# Patient Record
Sex: Male | Born: 1955 | Race: White | Hispanic: No | Marital: Single | Smoking: Former smoker
Health system: Southern US, Community
[De-identification: ages and names within clinical notes are randomized; demographics above are authoritative.]

## PROBLEM LIST (undated history)

## (undated) DIAGNOSIS — E119 Type 2 diabetes mellitus without complications: Secondary | ICD-10-CM

## (undated) DIAGNOSIS — G629 Polyneuropathy, unspecified: Secondary | ICD-10-CM

## (undated) DIAGNOSIS — I10 Essential (primary) hypertension: Secondary | ICD-10-CM

## (undated) DIAGNOSIS — J449 Chronic obstructive pulmonary disease, unspecified: Secondary | ICD-10-CM

## (undated) DIAGNOSIS — I509 Heart failure, unspecified: Secondary | ICD-10-CM

---

## 2013-08-11 ENCOUNTER — Inpatient Hospital Stay: Payer: Self-pay | Admitting: Internal Medicine

## 2013-08-11 LAB — COMPREHENSIVE METABOLIC PANEL
Albumin: 3.8 g/dL (ref 3.4–5.0)
Alkaline Phosphatase: 66 U/L
Anion Gap: 9 (ref 7–16)
BUN: 13 mg/dL (ref 7–18)
Bilirubin,Total: 0.7 mg/dL (ref 0.2–1.0)
Calcium, Total: 9.1 mg/dL (ref 8.5–10.1)
Chloride: 100 mmol/L (ref 98–107)
Glucose: 368 mg/dL — ABNORMAL HIGH (ref 65–99)
Osmolality: 287 (ref 275–301)

## 2013-08-11 LAB — URINALYSIS, COMPLETE
Bilirubin,UR: NEGATIVE
Glucose,UR: >=500 mg/dL (ref 0–75)
Hyaline Cast: 1
Ketone: NEGATIVE
Leukocyte Esterase: NEGATIVE
Nitrite: NEGATIVE
Protein: 30
Specific Gravity: 1.009 (ref 1.003–1.030)
WBC UR: NONE SEEN /HPF (ref 0–5)

## 2013-08-11 LAB — DRUG SCREEN, URINE
Amphetamines, Ur Screen: NEGATIVE (ref ?–1000)
MDMA (Ecstasy)Ur Screen: NEGATIVE (ref ?–500)
Phencyclidine (PCP) Ur S: NEGATIVE (ref ?–25)

## 2013-08-11 LAB — CBC
HCT: 45.9 % (ref 40.0–52.0)
HGB: 14.9 g/dL (ref 13.0–18.0)
MCH: 30.7 pg (ref 26.0–34.0)
MCHC: 32.6 g/dL (ref 32.0–36.0)
MCV: 94 fL (ref 80–100)
RDW: 13.4 % (ref 11.5–14.5)

## 2013-08-11 LAB — ETHANOL: Ethanol: <3 mg/dL

## 2013-08-12 LAB — CBC WITH DIFFERENTIAL/PLATELET
Basophil #: 0 10*3/uL (ref 0.0–0.1)
Basophil %: 0.4 %
Eosinophil #: 0.1 10*3/uL (ref 0.0–0.7)
HCT: 39.7 % — ABNORMAL LOW (ref 40.0–52.0)
Lymphocyte %: 19.5 %
MCHC: 33.7 g/dL (ref 32.0–36.0)
Monocyte #: 1 x10 3/mm (ref 0.2–1.0)
Neutrophil #: 7.6 10*3/uL — ABNORMAL HIGH (ref 1.4–6.5)
Platelet: 199 10*3/uL (ref 150–440)
WBC: 10.9 10*3/uL — ABNORMAL HIGH (ref 3.8–10.6)

## 2013-08-12 LAB — COMPREHENSIVE METABOLIC PANEL
Albumin: 2.8 g/dL — ABNORMAL LOW (ref 3.4–5.0)
Alkaline Phosphatase: 49 U/L
Anion Gap: 1 — ABNORMAL LOW (ref 7–16)
Bilirubin,Total: 0.5 mg/dL (ref 0.2–1.0)
Calcium, Total: 8.3 mg/dL — ABNORMAL LOW (ref 8.5–10.1)
Co2: 32 mmol/L (ref 21–32)
Creatinine: 1.09 mg/dL (ref 0.60–1.30)
EGFR (African American): >60
EGFR (Non-African Amer.): >60
Glucose: 160 mg/dL — ABNORMAL HIGH (ref 65–99)
Osmolality: 274 (ref 275–301)
Potassium: 3.7 mmol/L (ref 3.5–5.1)
SGOT(AST): 20 U/L (ref 15–37)
Total Protein: 5.8 g/dL — ABNORMAL LOW (ref 6.4–8.2)

## 2013-08-12 LAB — HEMOGLOBIN A1C: Hemoglobin A1C: 6.9 % — ABNORMAL HIGH (ref 4.2–6.3)

## 2013-08-12 LAB — LIPID PANEL: HDL Cholesterol: 35 mg/dL — ABNORMAL LOW (ref 40–60)

## 2013-08-20 ENCOUNTER — Observation Stay: Payer: Self-pay | Admitting: Internal Medicine

## 2013-08-20 LAB — COMPREHENSIVE METABOLIC PANEL
Albumin: 3.7 g/dL (ref 3.4–5.0)
Anion Gap: 1 — ABNORMAL LOW (ref 7–16)
Bilirubin,Total: 0.8 mg/dL (ref 0.2–1.0)
Calcium, Total: 9.1 mg/dL (ref 8.5–10.1)
Co2: 34 mmol/L — ABNORMAL HIGH (ref 21–32)
Creatinine: 1.22 mg/dL (ref 0.60–1.30)
EGFR (African American): >60
EGFR (Non-African Amer.): >60
Glucose: 115 mg/dL — ABNORMAL HIGH (ref 65–99)
SGOT(AST): 29 U/L (ref 15–37)

## 2013-08-20 LAB — CBC WITH DIFFERENTIAL/PLATELET
Basophil #: 0.1 10*3/uL (ref 0.0–0.1)
Basophil %: 0.7 %
Eosinophil #: 0 10*3/uL (ref 0.0–0.7)
HGB: 15.5 g/dL (ref 13.0–18.0)
MCH: 30.8 pg (ref 26.0–34.0)
MCHC: 34 g/dL (ref 32.0–36.0)
MCV: 91 fL (ref 80–100)
Monocyte #: 1.2 x10 3/mm — ABNORMAL HIGH (ref 0.2–1.0)
Monocyte %: 16.3 %
Neutrophil %: 64.6 %
Platelet: 277 10*3/uL (ref 150–440)
RBC: 5.04 10*6/uL (ref 4.40–5.90)
RDW: 13 % (ref 11.5–14.5)
WBC: 7.5 10*3/uL (ref 3.8–10.6)

## 2013-08-20 LAB — URINALYSIS, COMPLETE
Bacteria: NONE SEEN
Bilirubin,UR: NEGATIVE
Blood: NEGATIVE
Glucose,UR: NEGATIVE mg/dL (ref 0–75)
Hyaline Cast: 1
Ketone: NEGATIVE
Protein: NEGATIVE
RBC,UR: 1 /HPF (ref 0–5)
Specific Gravity: 1.014 (ref 1.003–1.030)
Squamous Epithelial: NONE SEEN
WBC UR: 1 /HPF (ref 0–5)

## 2013-08-20 LAB — RAPID INFLUENZA A&B ANTIGENS

## 2013-08-20 LAB — TROPONIN I
Troponin-I: 0.22 ng/mL — ABNORMAL HIGH
Troponin-I: 0.22 ng/mL — ABNORMAL HIGH
Troponin-I: 0.22 ng/mL — ABNORMAL HIGH

## 2013-08-20 LAB — PROTIME-INR
INR: 1
Prothrombin Time: 13 secs (ref 11.5–14.7)

## 2013-08-20 LAB — CK TOTAL AND CKMB (NOT AT ARMC)
CK, Total: 39 U/L (ref 35–232)
CK-MB: 1.3 ng/mL (ref 0.5–3.6)

## 2013-08-21 DIAGNOSIS — R079 Chest pain, unspecified: Secondary | ICD-10-CM

## 2013-08-22 LAB — BETA STREP CULTURE(ARMC)

## 2014-05-13 DIAGNOSIS — J441 Chronic obstructive pulmonary disease with (acute) exacerbation: Secondary | ICD-10-CM | POA: Diagnosis present

## 2014-05-13 DIAGNOSIS — E1142 Type 2 diabetes mellitus with diabetic polyneuropathy: Secondary | ICD-10-CM | POA: Diagnosis present

## 2014-05-13 DIAGNOSIS — I10 Essential (primary) hypertension: Secondary | ICD-10-CM | POA: Diagnosis present

## 2014-05-13 DIAGNOSIS — Z794 Long term (current) use of insulin: Secondary | ICD-10-CM | POA: Diagnosis present

## 2014-05-13 DIAGNOSIS — E1165 Type 2 diabetes mellitus with hyperglycemia: Secondary | ICD-10-CM | POA: Diagnosis present

## 2014-05-13 DIAGNOSIS — J449 Chronic obstructive pulmonary disease, unspecified: Secondary | ICD-10-CM | POA: Diagnosis present

## 2014-12-16 NOTE — H&P (Signed)
PATIENT NAME:  Cody Alexander, Cody Alexander MR#:  [id number] DATE OF BIRTH:  1956/08/25  DATE OF ADMISSION:  08/11/2013  PRIMARY CARE PHYSICIAN: None.   REFERRING EMERGENCY ROOM PHYSICIAN:  Dr. Lucrezia Europe.   CHIEF COMPLAINT: Drug overdose.   HISTORY OF PRESENT ILLNESS: This is a 59 year old male with past medical history of blood pressure diagnosed a few years ago and was prescribed medication, but is not taking anything because of insurance issues and does not have money to pay for it.  He has not been following with any doctor for the last few years. Went to a party last night and he drank alcohol there.  He injected some cocaine and he sniffed some heroin.  He does not remember the amount, but he said that he took a lot and returned home around 3:30 in the morning. His girlfriend found him very drowsy and not very well oriented, not waking up.  After a few minutes, he stopped breathing and started turning purple so the girlfriend called EMS and they gave her instruction on the phone to start mouth-to-mouth breathing. She said she also did some CPR on her own but she does not know about his pulses. EMS arrived, gave him Narcan and brought him to the Emergency Room. He still continued remaining hypoxic and breathing at the rate of 8 to 10, so the ER physician started him on a Narcan IV drip. After giving injection and now on drip, he is more arousable and breathing on his own. He denies any suicidal attempt. He says that he was stupid to do this must drugs at the party. He is not a drug seeker and does not do any drugs on a regular basis. That is what he told me.  Currently feeling fine, somewhat thirsty and nauseated.   REVIEW OF SYSTEMS:  CONSTITUTIONAL: Negative for fever, fatigue, weakness, pain or weight loss.  EYES: No blurring, double vision, discharge, or redness.  EARS, NOSE, THROAT: No tinnitus, ear pain or hearing loss.  RESPIRATORY: No cough, wheezing, hemoptysis, or shortness of breath at this  time.  CARDIOVASCULAR: No chest pain, orthopnea, edema or palpitations.  GASTROINTESTINAL: No nausea, vomiting, diarrhea, abdominal pain.  GENITOURINARY: No dysuria, hematuria, or increased frequency of urination.  ENDOCRINE: No increased sweating. No heat or cold intolerance.  SKIN: No acne, rashes, or lesions on the skin.  MUSCULOSKELETAL: No pain or swelling in the joints.  NEUROLOGICAL: No numbness, weakness, tremors or vertigo.  PSYCHIATRIC: Appears a little anxious, but denies any baseline problems like bipolar or schizophrenia.   PAST MEDICAL HISTORY:  He was diagnosed with hypertension many years ago and he was prescribed some medicine but does not take any medicine and does not go to any doctor.   PAST SURGICAL HISTORY: None.   SOCIAL HISTORY: He lives with girlfriend, smokes 2 packs of cigarettes every day. Denies using alcohol on a regular basis, but sometimes drinks beer at parties and denies doing any drugs on a regular basis but at parties he does it.   FAMILY HISTORY: Unknown as he says that "I don't have any family members. I don't know anybody."  HOME MEDICATIONS: None.   PHYSICAL EXAMINATION: VITAL SIGNS: In the ER on arrival, respiration rate was 10 and oxygen saturation was 88% to 85%. Blood pressure is stable 125/87 and heart rate 95 and after starting on Narcan currently respiratory rate 16 to 18 and oxygen saturation is 95% on room air.  GENERAL: The patient is alert, appears slightly anxious at  this time and oriented very well to time, place, and person now. Has some confusion about exact events, what happened, but admits taking all these drugs at the party.  HEENT: Head and neck atraumatic. Conjunctiva pink. Oral mucosa slightly dry.  NECK: Supple. No JVD.  RESPIRATORY: Bilateral clear and equal air entry.  CARDIOVASCULAR: S1, S2 present, regular. No murmur.  ABDOMEN: Soft, nontender. Bowel sounds present. No organomegaly.  SKIN: There is some bruising on left  shin of tibia present, but otherwise no rashes.  LEGS: No edema.  NEUROLOGICAL: Power 5/5. Moves all 4 limbs. No gross abnormality.  PSYCHIATRIC: As mentioned above, slightly anxious.   IMPORTANT LABORATORY RESULTS:  Glucose 368, BUN 13, creatinine 2, sodium 136, potassium 4.3, chloride 100 and CO2 27, calcium 9.1,. Urine for toxicology is positive for cocaine, cannabinoids and opiate. WBC 11,000, hemoglobin 14.9, platelet count 243. Urinalysis is grossly negative. Chest x-ray is no evidence of acute cardiopulmonary disease.   ASSESSMENT AND PLAN: A 59 year old male with past medical history of hypertension, but not following very well with any doctor. Did heroine and cocaine, overdosed at party yesterday and had acute respiratory failure this morning.  Recovered on Narcan. Currently on Narcan drip.   1.  Acute respiratory failure due to drug overdose. We will continue Narcan drip and monitor him in Critical Care Unit. Currently he is breathing on his own and oxygen saturation is satisfactory. We will taper the Narcan drip, as allowed in Critical Care Unit setting and then observe him for continuity of his stability.   2.  Drug overdose. He took ecstasy, heroin and cocaine at a party yesterday. Admits that it was not suicidal and there is no suicidal ideation. It was a drug overdose at a party. He does not take medication or these drugs on a regular basis. I explained to him about the dangers of this type of episode and he understands and said that he was stupid to take this much drugs. He does not do it on routine basis.  3.  History of hypertension currently blood pressure is stable. We will monitor his blood pressure and see later on if need to start medicine or not.  4.  Hyperglycemia. We will check his finger stick without insulin coverage every 6 hours and will check HbA1c to check for long-term control  5.  Smoking tobacco abuse. Smoking cessation counseling is done for 4 minutes. Offered him to  have help like nicotine patch or gum but he said he would be fine and does not want to have any support while he is in the hospital.   CONDITION: Critical due to acute respiratory failure and high risk for having it again. We will monitor in Critical Care Unit. Plan explained to the patient and his girlfriend in the room. He is FULL CODE.   TOTAL TIME SPENT CRITICAL CARE:  50 minutes in this admission.    ____________________________ Hope PigeonVaibhavkumar G. Elisabeth PigeonVachhani, MD vgv:dp D: 08/11/2013 09:08:35 ET T: 08/11/2013 09:29:30 ET JOB#: [id number]  cc: Hope PigeonVaibhavkumar G. Elisabeth PigeonVachhani, MD, <Dictator> Altamese DillingVAIBHAVKUMAR Dally Oshel MD ELECTRONICALLY SIGNED 08/16/2013 14:38

## 2014-12-16 NOTE — Discharge Summary (Signed)
PATIENT NAME:  Cody Alexander, Cody Alexander MR#:  [id number] DATE OF BIRTH:  1955-12-09  DATE OF ADMISSION:  08/11/2013 DATE OF DISCHARGE:  08/12/2013  DISCHARGE DIAGNOSES:  1. Acute respiratory failure due to drug overdose, heroin and cocaine.  2. Acute renal failure.  3. Elevated blood sugar, needs diet control.   CONDITION ON DISCHARGE: Stable.   CODE STATUS: Full code.   DIET ON DISCHARGE: Carbohydrate-controlled ADA diet. Regular consistency.   FOLLOWUP: Advised to follow within 4 to 6 weeks, routine followup with primary care physician.   HISTORY OF PRESENTING ILLNESS: A 59 year old male, with past medical history of blood pressure diagnosed a few years ago and prescribed some medication, but was not taking anything because of insurance issue, did not have money to pay for the medication. He went to a party the previous night and drank some alcohol and injected some cocaine and sniffed some heroin. After that, he does not remember how much was the amount and what happened. He returned home around 3:30 in the morning. Girlfriend found him very drowsy, not very well oriented and not waking up. After a few minutes he stopped breathing and started turning purple, so girlfriend called EMS, and they gave instruction on the phone to start mouth-to-mouth breathing and do some CPR. She did CPR and did some mouth-to-mouth breathing also. EMS arrived and gave him some Narcan and brought him to the Emergency Room. He had some response initially, but then was still remaining drowsy, and his breathing rate was ranging from 8 to 10, and he was going hypoxic, so ER physician started him on Narcan IV drip and gave him as admission to hospitalist team.   HOSPITAL COURSE:  1. On further evaluation, he was found to be needing Narcan drip for a longer time, so admitted to CCU for further management. He remained in CCU on Narcan drip for a few hours and slowly tapering down the Narcan drip. By evening, he was able to come  off Narcan drip and had a sound sleep. He remained totally fine. Heart rate, respiratory rate and oxygenation remained stable after he was out of the effect of medication, and so next day, we decided to discharge him after counseling of not to do drugs. Other medical issues in the hospital:  2. History of hypertension. As he was telling, he had hypertension in the past, but was not taking the medication. We monitored in the hospital, and blood pressure was under control without any medication, so we did not give any medicines on discharge.  3. Hyperglycemia. Blood sugar was in the range of 140 to 180, and his HbA1c level was 6.9, so we advised him on diet control for a few months and get it checked again with his primary care physician.  4. Tobacco smoking. Advised to have smoking cessation. He agreed, and he said he will try after the discharge.  5. Acute renal failure. This was present on admission, but improved with IV fluids and was normal on discharge.   IMPORTANT LABORATORY RESULTS IN THE HOSPITAL: Ethanol level less than 3. WBC count was 11,000, hemoglobin was 14.9, platelet was 243. Creatinine was 2.0, sodium 136 and potassium 4.3 on admission. Urinalysis was grossly negative. Urine for toxicology: Cocaine was positive, and opiate was positive, cannabinoid was also positive in urine. Chest x-ray, portable, single view: No evidence of acute cardiopulmonary disease. Next, on followup creatinine, it came to 1.09. Hemoglobin A1c was 6.9.   TOTAL TIME SPENT ON THIS DISCHARGE: 45 minutes.  ____________________________ Hope PigeonVaibhavkumar G. Elisabeth PigeonVachhani, MD vgv:lb D: 08/13/2013 23:34:51 ET T: 08/14/2013 07:12:46 ET JOB#: [id number]  cc: Hope PigeonVaibhavkumar G. Elisabeth PigeonVachhani, MD, <Dictator> Altamese DillingVAIBHAVKUMAR Israa Caban MD ELECTRONICALLY SIGNED 08/16/2013 14:39

## 2014-12-17 NOTE — H&P (Signed)
PATIENT NAME:  Cody Alexander, Cody Alexander DATE OF BIRTH:  August 09, 1956  DATE OF ADMISSION:  08/20/2013  PRIMARY CARE PHYSICIAN: None.   REFERRING ER PHYSICIAN: Dr. Manson PasseyBrown.  CHIEF COMPLAINT: Chest pain.   HISTORY OF PRESENT ILLNESS: This is a 59 year old male with past medical history of blood pressure and who is a smoker, was a drug abuser but says that since last 1 week when he was admitted with drug overdose and possible cardiac arrest secondary to that, since then he is not using any drugs. As per him, since last week when he went home he started having complaint of runny nose, some congestion in the sinuses, headache, and had a fever also 1 or 2 times in the last 1 week. Last evening when he was in the bathroom he almost passed out for almost a minute, but denies any palpitation or injuries during that episode and felt some chest pain at that time but no palpitation. On further questioning, he says that he always had chest pain coming on and off, not associated with any activity, and getting resolved by itself. Today morning because of his excessive headaches and fever and runny nose he decided to come to the Emergency Room. When he was in the waiting room, in ER, he had severe chest pain which was crushing type, central, 8 out of 10. He did not speak to anybody about the pain, but he just relaxed and took some deep breaths and tried to calm him down. After 5 to 10 minutes the pain went away. He did not speak to any nurse in waiting room about this thing. When the ER physician came to evaluate him he spoke to him about this thing. EKG was done which showed some T wave inversion and troponin was 0.22, slightly elevated, so he is given for further management of his chest pain issues.   REVIEW OF SYSTEMS: . It was (Dictatio CONSTITUTIONAL: Positive for fever and fatigue, but no weakness, weight loss, or weight gain.  EYES: No blurring or double vision, discharge or redness.  EARS, NOSE, THROAT:  No tinnitus, ear pain, or hearing loss.  RESPIRATORY: No cough or wheezing, but had flulike symptoms since 1 week.  CARDIOVASCULAR: Has some chest pain and had episode of syncope last night. No palpitations or edema on the limbs.  GASTROINTESTINAL: No nausea, vomiting, diarrhea, or abdominal pain.  GENITOURINARY: No dysuria, hematuria, or increased frequency.  ENDOCRINE: No increased sweating. No heat or cold intolerance.  SKIN: No acne, rashes, or lesions on the skin.  MUSCULOSKELETAL: No pain or swelling in the joints.  NEUROLOGICAL: No numbness, weakness, tremors, or vertigo.  PSYCHIATRIC: No anxiety, insomnia, or bipolar disorder.   PAST MEDICAL HISTORY:  Hypertension years ago, but was not taking any medication and last week when he was in the hospital was not found to have hypertension. 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   SOCIAL HISTORY: Lives with girlfriend. Smoked 2 packs of cigarettes every day, for the last 5 days did not smoke anything. Sometimes drinks beer and for drugs also he would do it recreationally occasionally, but after having an episode of severe drug overdose last week he said he is not doing it anymore.  FAMILY HISTORY: Father had some heart issues of some blockages and irregular heartbeats in his 4650s, and he died of a heart attack at age of 58.   HOME MEDICATIONS:  None.  PHYSICAL EXAMINATION:  VITAL SIGNS: In the ER, temperature 98.9, pulse 96, respirations 22, blood pressure 146/104, and pulse ox of 94% on room air.  GENERAL: The patient is fully alert and oriented to time, place, and person. Does not appear in any acute distress.  HEENT: Head and neck atraumatic. Conjunctiva pink. Oral mucosa moist.  NECK: Supple. No JVD.  RESPIRATORY: Bilateral clear and equal air entry.  CARDIOVASCULAR: S1 and S2 present, regular. No murmur.  ABDOMEN: Soft, nontender. Bowel sounds present. Obese. No  organomegaly.  SKIN: No rashes.  LEGS: No edema.  NEUROLOGICAL: Power 5/5. Follows command. Moves all 4 limbs. No gross abnormality.  PSYCHIATRIC: Does not appear in any acute psychiatric illness.  JOINTS: No swelling or tenderness.   IMPORTANT LABORATORY AND DIAGNOSTICS: Glucose 115, BUN 11, creatinine 1.22, sodium 134, potassium 3.7, chloride 99, CO2 34, calcium 9.1. Troponin 0.22. WBC 7.5, hemoglobin 15.5, platelet count 277. Mean corpuscular volume is 91. Influenza A and B are negative.   Chest x-ray and CT of the head without any acute findings. EKG revealed some T wave inversion.   ASSESSMENT AND PLAN: A 59 year old male with no significant past medical history but obese and a smoker having family history of myocardial infarction in father who came with passing out episode last night and had chest pain today in the Emergency Room with inversion of T wave and some elevated troponin.  1.  Chest pain. We will admit him to telemetry floor and follow serial troponins. Will do 1 more troponin within 4 hours and I spoke to cardiologist, Dr. Juliann Pares, and nuclear lab, and they would be able to do the stress test today to rule out ischemic finding. If it is so then he might be able to get discharged in the afternoon, otherwise, we will have to keep him in the hospital for further management. Right now we gave him aspirin. No need for beta blocker as he is going for stress test and blood pressure is stable. His lipid panel was checked last week and was within acceptable range. LDL was 53. Total cholesterol was 101 and triglyceride was 67. 2.  Flu-like symptoms and congestion with headache and fever. Chest x-ray is clear, white cell count is stable, and influenza A and B antigens are negative. Will give him symptomatic treatment with Mucinex. Currently, there is no fever. Otherwise, we will give Tylenol also.   CODE STATUS: FULL.  TOTAL TIME SPENT ON THIS ADMISSION: 50 minutes.   ____________________________ Hope Pigeon Elisabeth Pigeon, MD vgv:sb D: 08/20/2013 09:05:06 ET T: 08/20/2013 09:36:23 ET JOB#: [id number]  cc: Hope Pigeon. Elisabeth Pigeon, MD, <Dictator> Altamese Dilling MD ELECTRONICALLY SIGNED 08/29/2013 21:58

## 2014-12-17 NOTE — Discharge Summary (Signed)
PATIENT NAME:  Cody BernardROBERTSON, Jaishawn MR#:  [id number] DATE OF BIRTH:  Jun 10, 1956  DATE OF ADMISSION:  08/20/2013 DATE OF DISCHARGE:  08/21/2013  DISCHARGE DIAGNOSES:  1.  Chest pain, coronary artery disease ruled out by stress test.  2.  Hypertension.  3.  Obesity.  4.  Hyperglycemia, diet-controlled and lose weight.  5.  Ex-smoker.   CONDITION ON DISCHARGE: Stable.   CODE STATUS: FULL CODE.  DISCHARGE MEDICATIONS: Metoprolol 25 mg take half tablet 2 times a day.   DIET: Advised to low sodium, carbohydrate-controlled ADA diet.   DISCHARGE INSTRUCTIONS: Within 2 to 4 weeks, follow up with PMD and regular checkup of blood pressure and lose weight.  HISTORY OF PRESENT ILLNESS: This is a 59 year old male with past medical history of blood pressure who was a smoker and drug abuser. For the last 1 week, when he was admitted with drug overdose, he did not smoke any. Now started for the last few days runny nose, congestion in the chest and in the sinuses with headache. Fever also 1 to 2 times last week, and so in the previous evening, he was in the bathroom, almost passed out for a minute. Denied any palpitations, injury. During the episode, he felt some chest pain also but no palpitation. He also had some chest pain on and off associated with not any activity, coming at any time in the past.  In the ER, his pain was crushing, 8 out of 10, and he tried to take some deep breaths to calm down, and after 5 to 10 minutes, the pain went away. So he was admitted for further evaluation of his chest pain. Admitted and stress test was done. Troponin remained stable at 0.22. Telemetry remained nonsignificant, and his stress test was reported negative.   Hypertension. He was advised to lose some weight and metoprolol started.   Hypoglycemia. Advised to lose weight and do some low-sugar diet.   No medications were prescribed.   IMPORTANT LABORATORY RESULTS IN THE HOSPITAL: Influenza A and B were negative.  Beta strep culture was negative. White cell count 7.5, hemoglobin 15.5 and platelet count 277. Creatinine 1.22 on admission, sodium 134 and potassium 3.7. Troponin remained stable at 0.22 level.  Stress test was done which showed no significant ischemia, ejection fraction of 44%.   TOTAL TIME SPENT ON THIS DISCHARGE: 40 minutes.   ____________________________ Hope PigeonVaibhavkumar G. Elisabeth PigeonVachhani, MD vgv:np D: 08/25/2013 17:48:00 ET T: 08/25/2013 21:27:28 ET JOB#: [id number]  cc: Hope PigeonVaibhavkumar G. Elisabeth PigeonVachhani, MD, <Dictator> Altamese DillingVAIBHAVKUMAR Blima Jaimes MD ELECTRONICALLY SIGNED 08/29/2013 22:01

## 2016-09-25 ENCOUNTER — Emergency Department
Admission: EM | Admit: 2016-09-25 | Discharge: 2016-09-25 | Disposition: A | Discharge status: Home / Self Care | Payer: Self-pay | Attending: Emergency Medicine | Admitting: Emergency Medicine

## 2016-09-25 ENCOUNTER — Emergency Department: Payer: Self-pay

## 2016-09-25 DIAGNOSIS — R55 Syncope and collapse: Secondary | ICD-10-CM

## 2016-09-25 DIAGNOSIS — J069 Acute upper respiratory infection, unspecified: Secondary | ICD-10-CM | POA: Insufficient documentation

## 2016-09-25 DIAGNOSIS — I10 Essential (primary) hypertension: Secondary | ICD-10-CM | POA: Insufficient documentation

## 2016-09-25 LAB — CBC
HEMATOCRIT: 46.4 % (ref 40.0–52.0)
Hemoglobin: 15.3 g/dL (ref 13.0–18.0)
MCH: 29.9 pg (ref 26.0–34.0)
MCHC: 33 g/dL (ref 32.0–36.0)
MCV: 90.7 fL (ref 80.0–100.0)
Platelets: 256 10*3/uL (ref 150–440)
RBC: 5.12 MIL/uL (ref 4.40–5.90)
RDW: 13.3 % (ref 11.5–14.5)
WBC: 7.7 10*3/uL (ref 3.8–10.6)

## 2016-09-25 LAB — INFLUENZA PANEL BY PCR (TYPE A & B)
INFLBPCR: NEGATIVE
Influenza A By PCR: NEGATIVE

## 2016-09-25 LAB — BASIC METABOLIC PANEL
ANION GAP: 6 (ref 5–15)
BUN: 12 mg/dL (ref 6–20)
CHLORIDE: 101 mmol/L (ref 101–111)
CO2: 30 mmol/L (ref 22–32)
Calcium: 9 mg/dL (ref 8.9–10.3)
Creatinine, Ser: 1.02 mg/dL (ref 0.61–1.24)
Glucose, Bld: 151 mg/dL — ABNORMAL HIGH (ref 65–99)
POTASSIUM: 4.5 mmol/L (ref 3.5–5.1)
SODIUM: 137 mmol/L (ref 135–145)

## 2016-09-25 LAB — TROPONIN I
TROPONIN I: 0.05 ng/mL — AB (ref <0.03)
TROPONIN I: 0.05 ng/mL — AB (ref <0.03)

## 2016-09-25 MED ORDER — HYDROCOD POLST-CPM POLST ER 10-8 MG/5ML PO SUER
5.0000 mL | Freq: Once | ORAL | Status: AC
  Administered 2016-09-25: 5 mL via ORAL
  Filled 2016-09-25: qty 5

## 2016-09-25 MED ORDER — IPRATROPIUM-ALBUTEROL 0.5-2.5 (3) MG/3ML IN SOLN
3.0000 mL | Freq: Once | RESPIRATORY_TRACT | Status: AC
  Administered 2016-09-25: 3 mL via RESPIRATORY_TRACT
  Filled 2016-09-25: qty 3

## 2016-09-25 MED ORDER — ALBUTEROL SULFATE HFA 108 (90 BASE) MCG/ACT IN AERS: 2.0000 | INHALATION_SPRAY | Freq: Four times a day (QID) | RESPIRATORY_TRACT | 2 refills | Status: DC | PRN

## 2016-09-25 MED ORDER — DOXYCYCLINE HYCLATE 100 MG PO CAPS: 100.0000 mg | ORAL_CAPSULE | Freq: Two times a day (BID) | ORAL | 0 refills | Status: AC

## 2016-09-25 MED ORDER — SODIUM CHLORIDE 0.9 % IV BOLUS (SEPSIS)
500.0000 mL | Freq: Once | INTRAVENOUS | Status: AC
  Administered 2016-09-25: 500 mL via INTRAVENOUS

## 2016-09-25 MED ORDER — HYDROCOD POLST-CPM POLST ER 10-8 MG/5ML PO SUER: 5.0000 mL | Freq: Every evening | ORAL | 0 refills | Status: DC | PRN

## 2016-09-25 NOTE — ED Notes (Signed)
Pt reports for the past month he has had a cough that has been non productive, pt reports for the last two weeks he has been coughing so hard that it causes him to get dizzy and "pass out".

## 2016-09-25 NOTE — Discharge Instructions (Signed)
You have been seen in the Emergency Department (ED)  today for cough and congestion.  Your workup is most consistent with a viral illness.  Please drink plenty of fluids to prevent dehydration.  You may use Tylenol or Motrin, as written on the box, as needed for fever or discomfort. ° °Please follow up with your doctor within the next few dasy regarding today?s emergent visit and your current symptoms. ° °Return to the Emergency Department (ED)  if you have worsening cough, fever, ANY trouble breathing, chest pain, or other symptoms that concern you. ° °

## 2016-09-25 NOTE — ED Provider Notes (Signed)
[facility] Emergency Department Provider Note  ____________________________________________  Time seen: Approximately 12:28 PM  I have reviewed the triage vital signs and the nursing notes.   HISTORY  Chief Complaint Cough and Near Syncope   HPI Cody Alexander is a 61 y.o. male a history of hypertension who presents for evaluation of multiple syncopal episodes in the setting of a cough. Patient reports that he was diagnosed with pneumonia in the beginning of the month. Finished  course of antibiotics. Over the course of the last week he started to feel worse. Has had a dry cough that is very severe. He has had several syncopal episodes during his coughing fits, last yesterday. No chest pain, HA or palpitations. Has had shortness of breath with exertion x 1 week progressively worsening. Not a smoker. Fever as high as 103F. has not received his flu shot. Patient denies vomiting or diarrhea, body aches.  History reviewed. No pertinent past medical history.  There are no active problems to display for this patient.   History reviewed. No pertinent surgical history.  Prior to Admission medications   Medication Sig Start Date End Date Taking? Authorizing Provider  Pseudoephedrine HCl (SUDAFED 12 HOUR PO) Take 1 tablet by mouth daily as needed.   Yes Historical Provider, MD  albuterol (PROVENTIL HFA;VENTOLIN HFA) 108 (90 Base) MCG/ACT inhaler Inhale 2 puffs into the lungs every 6 (six) hours as needed for wheezing or shortness of breath. 09/25/16   Nita Sickle, MD  chlorpheniramine-HYDROcodone [facility]) 10-8 MG/5ML SUER Take 5 mLs by mouth at bedtime as needed for cough. 09/25/16   Nita Sickle, MD  doxycycline (VIBRAMYCIN) 100 MG capsule Take 1 capsule (100 mg total) by mouth 2 (two) times daily. 09/25/16 10/02/16  Nita Sickle, MD    Allergies Erythromycin  No family history on file.  Social History Social History   Substance Use Topics  . Smoking status: Never Smoker  . Smokeless tobacco: Never Used  . Alcohol use No    Review of Systems  Constitutional: + fever and syncope Eyes: Negative for visual changes. ENT: Negative for sore throat. Neck: No neck pain  Cardiovascular: Negative for chest pain. Respiratory: + shortness of breath and cough Gastrointestinal: Negative for abdominal pain, vomiting or diarrhea. Genitourinary: Negative for dysuria. Musculoskeletal: Negative for back pain. Skin: Negative for rash. Neurological: Negative for headaches, weakness or numbness. Psych: No SI or HI  ____________________________________________   PHYSICAL EXAM:  VITAL SIGNS: ED Triage Vitals  Enc Vitals Group     BP 09/25/16 1111 (!) 159/100     Pulse Rate 09/25/16 1111 (!) 106     Resp 09/25/16 1111 18     Temp 09/25/16 1111 98.9 F (37.2 C)     Temp src --      SpO2 09/25/16 1111 95 %     Weight 09/25/16 1112 250 lb (113.4 kg)     Height 09/25/16 1112 6\' 1"  (1.854 m)     Head Circumference --      Peak Flow --      Pain Score 09/25/16 1129 0     Pain Loc --      Pain Edu? --      Excl. in GC? --     Constitutional: Alert and oriented. Well appearing and in no apparent distress. HEENT:      Head: Normocephalic and atraumatic.         Eyes: Conjunctivae are normal. Sclera is non-icteric. EOMI. PERRL  Mouth/Throat: Mucous membranes are moist.       Neck: Supple with no signs of meningismus. Cardiovascular: Tachycardic with regular rhythm. No murmurs, gallops, or rubs. 2+ symmetrical distal pulses are present in all extremities. No JVD. Respiratory: Normal work of breathing, coarse rhonchi breath sounds bilaterally, normal sats on room air.  Gastrointestinal: Soft, non tender, and non distended with positive bowel sounds. No rebound or guarding. Musculoskeletal: Nontender with normal range of motion in all extremities. No edema, cyanosis, or erythema of extremities. Neurologic:  Normal speech and language. Face is symmetric. Moving all extremities. No gross focal neurologic deficits are appreciated. Skin: Skin is warm, dry and intact. No rash noted. Psychiatric: Mood and affect are normal. Speech and behavior are normal.  ____________________________________________   LABS (all labs ordered are listed, but only abnormal results are displayed)  Labs Reviewed  BASIC METABOLIC PANEL - Abnormal; Notable for the following:       Result Value   Glucose, Bld 151 (*)    All other components within normal limits  TROPONIN I - Abnormal; Notable for the following:    Troponin I 0.05 (*)    All other components within normal limits  TROPONIN I - Abnormal; Notable for the following:    Troponin I 0.05 (*)    All other components within normal limits  CBC  INFLUENZA PANEL BY PCR (TYPE A & B)  URINALYSIS, COMPLETE (UACMP) WITH MICROSCOPIC  CBG MONITORING, ED   ____________________________________________  EKG  ED ECG REPORT I, Nita Sickle, the attending physician, personally viewed and interpreted this ECG.  Normal sinus rhythm, rate of 89, normal intervals, normal axis, T-wave inversions in the lateral leads, no ST elevations. Change from prior from 2014 ____________________________________________  RADIOLOGY  CXR: Negative ____________________________________________   PROCEDURES  Procedure(s) performed: None Procedures Critical Care performed:  None ____________________________________________   INITIAL IMPRESSION / ASSESSMENT AND PLAN / ED COURSE   61 y.o. male a history of hypertension who presents for evaluation of multiple syncopal episodes in the setting of severe dry cough, shortness of breath and fever. Patient is well-appearing, in no distress, mildly tachycardic with heart rate of 106, normal work of breathing with diffuse coarse rhonchi, normal sats. Differential diagnosis including flu versus pneumonia versus viral URI. We'll check  flu, chest x-ray, basic labs. EKG was noted this of ischemia. Troponin is pending. We'll give DuoNeb treatment and tussionex for cough.  Clinical Course as of Sep 25 1514  Wed Sep 25, 2016  1513 Vitals improved after IV fluids. Troponin 2 with no changes. Flu is negative, chest x-ray with no acute findings. The patient received 1 DuoNeb treatment and is moving good air with improvement of rhonchi. He'll be given a Z-Pak, albuterol, Tussionex. Recommended follow-up with primary care doctor.  [CV]    Clinical Course User Index [CV] Nita Sickle, MD    Pertinent labs & imaging results that were available during my care of the patient were reviewed by me and considered in my medical decision making (see chart for details).    ____________________________________________   FINAL CLINICAL IMPRESSION(S) / ED DIAGNOSES  Final diagnoses:  Upper respiratory tract infection, unspecified type  Syncope, unspecified syncope type      NEW MEDICATIONS STARTED DURING THIS VISIT:  New Prescriptions   ALBUTEROL (PROVENTIL HFA;VENTOLIN HFA) 108 (90 BASE) MCG/ACT INHALER    Inhale 2 puffs into the lungs every 6 (six) hours as needed for wheezing or shortness of breath.   CHLORPHENIRAMINE-HYDROCODONE [facility])  10-8 MG/5ML SUER    Take 5 mLs by mouth at bedtime as needed for cough.   DOXYCYCLINE (VIBRAMYCIN) 100 MG CAPSULE    Take 1 capsule (100 mg total) by mouth 2 (two) times daily.     Note:  This document was prepared using Dragon voice recognition software and may include unintentional dictation errors.    Nita Sicklearolina Amelia Macken, MD 09/25/16 [phone]

## 2016-09-25 NOTE — ED Triage Notes (Signed)
Pt reports nonproductive cough and near syncope when coughing X 3 days. Pt would like to evaluated for pneumonia. Pt alert and oriented X4, active, cooperative, pt in NAD. RR even and unlabored, color WNL.

## 2017-07-29 DIAGNOSIS — I5042 Chronic combined systolic (congestive) and diastolic (congestive) heart failure: Secondary | ICD-10-CM | POA: Diagnosis present

## 2018-02-25 ENCOUNTER — Encounter: Payer: Self-pay | Admitting: Medical Oncology

## 2018-02-25 ENCOUNTER — Emergency Department: Payer: Medicaid Other

## 2018-02-25 ENCOUNTER — Inpatient Hospital Stay
Admission: EM | Admit: 2018-02-25 | Discharge: 2018-02-28 | DRG: 281 | Disposition: A | Discharge status: Home / Self Care | Payer: Medicaid Other | Attending: Internal Medicine | Admitting: Internal Medicine

## 2018-02-25 ENCOUNTER — Other Ambulatory Visit: Payer: Self-pay

## 2018-02-25 ENCOUNTER — Inpatient Hospital Stay
Admit: 2018-02-25 | Discharge: 2018-02-25 | Disposition: A | Discharge status: Home / Self Care | Payer: Medicaid Other | Attending: Cardiovascular Disease | Admitting: Cardiovascular Disease

## 2018-02-25 DIAGNOSIS — J440 Chronic obstructive pulmonary disease with acute lower respiratory infection: Secondary | ICD-10-CM | POA: Diagnosis not present

## 2018-02-25 DIAGNOSIS — Z87891 Personal history of nicotine dependence: Secondary | ICD-10-CM

## 2018-02-25 DIAGNOSIS — E119 Type 2 diabetes mellitus without complications: Secondary | ICD-10-CM | POA: Diagnosis present

## 2018-02-25 DIAGNOSIS — Y92238 Other place in hospital as the place of occurrence of the external cause: Secondary | ICD-10-CM | POA: Diagnosis present

## 2018-02-25 DIAGNOSIS — E876 Hypokalemia: Secondary | ICD-10-CM | POA: Diagnosis not present

## 2018-02-25 DIAGNOSIS — R079 Chest pain, unspecified: Secondary | ICD-10-CM | POA: Diagnosis not present

## 2018-02-25 DIAGNOSIS — I110 Hypertensive heart disease with heart failure: Secondary | ICD-10-CM | POA: Diagnosis present

## 2018-02-25 DIAGNOSIS — Y92230 Patient room in hospital as the place of occurrence of the external cause: Secondary | ICD-10-CM | POA: Diagnosis not present

## 2018-02-25 DIAGNOSIS — I214 Non-ST elevation (NSTEMI) myocardial infarction: Secondary | ICD-10-CM

## 2018-02-25 DIAGNOSIS — I259 Chronic ischemic heart disease, unspecified: Secondary | ICD-10-CM

## 2018-02-25 DIAGNOSIS — I952 Hypotension due to drugs: Secondary | ICD-10-CM | POA: Diagnosis present

## 2018-02-25 DIAGNOSIS — R0603 Acute respiratory distress: Secondary | ICD-10-CM | POA: Diagnosis not present

## 2018-02-25 DIAGNOSIS — Z79899 Other long term (current) drug therapy: Secondary | ICD-10-CM | POA: Diagnosis not present

## 2018-02-25 DIAGNOSIS — Z881 Allergy status to other antibiotic agents status: Secondary | ICD-10-CM | POA: Diagnosis not present

## 2018-02-25 DIAGNOSIS — J209 Acute bronchitis, unspecified: Secondary | ICD-10-CM | POA: Diagnosis not present

## 2018-02-25 DIAGNOSIS — Z87892 Personal history of anaphylaxis: Secondary | ICD-10-CM

## 2018-02-25 DIAGNOSIS — I5032 Chronic diastolic (congestive) heart failure: Secondary | ICD-10-CM | POA: Diagnosis present

## 2018-02-25 DIAGNOSIS — T402X5A Adverse effect of other opioids, initial encounter: Secondary | ICD-10-CM | POA: Diagnosis present

## 2018-02-25 DIAGNOSIS — E669 Obesity, unspecified: Secondary | ICD-10-CM | POA: Diagnosis present

## 2018-02-25 DIAGNOSIS — Z6839 Body mass index (BMI) 39.0-39.9, adult: Secondary | ICD-10-CM

## 2018-02-25 DIAGNOSIS — I2510 Atherosclerotic heart disease of native coronary artery without angina pectoris: Secondary | ICD-10-CM | POA: Diagnosis present

## 2018-02-25 HISTORY — DX: Type 2 diabetes mellitus without complications: E11.9

## 2018-02-25 HISTORY — DX: Heart failure, unspecified: I50.9

## 2018-02-25 HISTORY — DX: Chronic obstructive pulmonary disease, unspecified: J44.9

## 2018-02-25 HISTORY — DX: Essential (primary) hypertension: I10

## 2018-02-25 LAB — BASIC METABOLIC PANEL
Anion gap: 8 (ref 5–15)
BUN: 16 mg/dL (ref 8–23)
CO2: 30 mmol/L (ref 22–32)
Calcium: 9.4 mg/dL (ref 8.9–10.3)
Chloride: 101 mmol/L (ref 98–111)
Creatinine, Ser: 1.14 mg/dL (ref 0.61–1.24)
GFR calc Af Amer: >60 mL/min (ref >60)
GLUCOSE: 154 mg/dL — AB (ref 70–99)
POTASSIUM: 4.2 mmol/L (ref 3.5–5.1)
Sodium: 139 mmol/L (ref 135–145)

## 2018-02-25 LAB — HEMOGLOBIN A1C
HEMOGLOBIN A1C: 8.3 % — AB (ref 4.8–5.6)
Mean Plasma Glucose: 191.51 mg/dL

## 2018-02-25 LAB — CBC
HEMATOCRIT: 47.1 % (ref 40.0–52.0)
Hemoglobin: 16 g/dL (ref 13.0–18.0)
MCH: 31.4 pg (ref 26.0–34.0)
MCHC: 34 g/dL (ref 32.0–36.0)
MCV: 92.2 fL (ref 80.0–100.0)
PLATELETS: 260 10*3/uL (ref 150–440)
RBC: 5.11 MIL/uL (ref 4.40–5.90)
RDW: 13.8 % (ref 11.5–14.5)
WBC: 7.5 10*3/uL (ref 3.8–10.6)

## 2018-02-25 LAB — TROPONIN I
Troponin I: 0.11 ng/mL (ref <0.03)
Troponin I: 0.11 ng/mL (ref <0.03)

## 2018-02-25 LAB — GLUCOSE, CAPILLARY
GLUCOSE-CAPILLARY: 121 mg/dL — AB (ref 70–99)
GLUCOSE-CAPILLARY: 190 mg/dL — AB (ref 70–99)

## 2018-02-25 LAB — MRSA PCR SCREENING: MRSA BY PCR: NEGATIVE

## 2018-02-25 LAB — ECHOCARDIOGRAM COMPLETE
HEIGHTINCHES: 73 in
Weight: 4800 oz

## 2018-02-25 MED ORDER — MORPHINE SULFATE (PF) 2 MG/ML IV SOLN
2.0000 mg | INTRAVENOUS | Status: AC
  Administered 2018-02-25: 2 mg via INTRAVENOUS

## 2018-02-25 MED ORDER — METOPROLOL TARTRATE 25 MG PO TABS
25.0000 mg | ORAL_TABLET | Freq: Two times a day (BID) | ORAL | Status: DC
  Administered 2018-02-26 – 2018-02-27 (×3): 25 mg via ORAL
  Filled 2018-02-25 (×4): qty 1

## 2018-02-25 MED ORDER — SODIUM CHLORIDE 0.9 % IV BOLUS
1000.0000 mL | Freq: Once | INTRAVENOUS | Status: AC
Start: 2018-02-25 — End: 2018-02-25
  Administered 2018-02-25: 1000 mL via INTRAVENOUS

## 2018-02-25 MED ORDER — ONDANSETRON HCL 4 MG/2ML IJ SOLN
4.0000 mg | Freq: Four times a day (QID) | INTRAMUSCULAR | Status: DC | PRN
  Administered 2018-02-26: 4 mg via INTRAVENOUS
  Filled 2018-02-25: qty 2

## 2018-02-25 MED ORDER — TRAZODONE HCL 100 MG PO TABS: 100.0000 mg | ORAL_TABLET | Freq: Every evening | ORAL | Status: DC | PRN

## 2018-02-25 MED ORDER — GUAIFENESIN 100 MG/5ML PO SOLN
5.0000 mL | ORAL | Status: DC | PRN
  Administered 2018-02-25 – 2018-02-27 (×8): 100 mg via ORAL
  Filled 2018-02-25 (×10): qty 5

## 2018-02-25 MED ORDER — ASPIRIN EC 81 MG PO TBEC
81.0000 mg | DELAYED_RELEASE_TABLET | Freq: Every day | ORAL | Status: DC
  Administered 2018-02-26: 81 mg via ORAL
  Filled 2018-02-25: qty 1

## 2018-02-25 MED ORDER — INSULIN ASPART 100 UNIT/ML ~~LOC~~ SOLN: 0.0000 [IU] | Freq: Every day | SUBCUTANEOUS | Status: DC

## 2018-02-25 MED ORDER — NITROGLYCERIN 2 % TD OINT
0.5000 [in_us] | TOPICAL_OINTMENT | Freq: Once | TRANSDERMAL | Status: AC
  Administered 2018-02-25: 0.5 [in_us] via TOPICAL
  Filled 2018-02-25: qty 1

## 2018-02-25 MED ORDER — ONDANSETRON HCL 4 MG PO TABS: 4.0000 mg | ORAL_TABLET | Freq: Four times a day (QID) | ORAL | Status: DC | PRN

## 2018-02-25 MED ORDER — HYDROCODONE-ACETAMINOPHEN 5-325 MG PO TABS
1.0000 | ORAL_TABLET | ORAL | Status: DC | PRN
  Administered 2018-02-25 – 2018-02-26 (×2): 1 via ORAL
  Administered 2018-02-26: 2 via ORAL
  Filled 2018-02-25: qty 2
  Filled 2018-02-25 (×2): qty 1

## 2018-02-25 MED ORDER — IPRATROPIUM-ALBUTEROL 0.5-2.5 (3) MG/3ML IN SOLN
3.0000 mL | Freq: Four times a day (QID) | RESPIRATORY_TRACT | Status: DC
  Administered 2018-02-25 – 2018-02-26 (×4): 3 mL via RESPIRATORY_TRACT
  Filled 2018-02-25 (×4): qty 3

## 2018-02-25 MED ORDER — PERFLUTREN LIPID MICROSPHERE
1.0000 mL | INTRAVENOUS | Status: AC | PRN
  Administered 2018-02-25: 6 mL via INTRAVENOUS
  Filled 2018-02-25: qty 10

## 2018-02-25 MED ORDER — SODIUM CHLORIDE 0.9 % IV SOLN
INTRAVENOUS | Status: DC
  Administered 2018-02-25 – 2018-02-26 (×2): via INTRAVENOUS

## 2018-02-25 MED ORDER — ENOXAPARIN SODIUM 150 MG/ML ~~LOC~~ SOLN
1.0000 mg/kg | Freq: Two times a day (BID) | SUBCUTANEOUS | Status: DC
  Administered 2018-02-26 – 2018-02-27 (×4): 135 mg via SUBCUTANEOUS
  Filled 2018-02-25 (×5): qty 0.91

## 2018-02-25 MED ORDER — ACETAMINOPHEN 325 MG PO TABS: 650.0000 mg | ORAL_TABLET | Freq: Four times a day (QID) | ORAL | Status: DC | PRN

## 2018-02-25 MED ORDER — CLOPIDOGREL BISULFATE 75 MG PO TABS
300.0000 mg | ORAL_TABLET | Freq: Once | ORAL | Status: AC
  Administered 2018-02-25: 300 mg via ORAL
  Filled 2018-02-25: qty 4

## 2018-02-25 MED ORDER — ONDANSETRON HCL 4 MG/2ML IJ SOLN
4.0000 mg | Freq: Once | INTRAMUSCULAR | Status: AC
  Administered 2018-02-25: 4 mg via INTRAVENOUS
  Filled 2018-02-25: qty 2

## 2018-02-25 MED ORDER — CLOPIDOGREL BISULFATE 75 MG PO TABS
75.0000 mg | ORAL_TABLET | Freq: Every day | ORAL | Status: DC
  Administered 2018-02-26: 75 mg via ORAL
  Filled 2018-02-25 (×2): qty 1

## 2018-02-25 MED ORDER — NITROGLYCERIN 2 % TD OINT
0.5000 [in_us] | TOPICAL_OINTMENT | Freq: Four times a day (QID) | TRANSDERMAL | Status: DC
  Filled 2018-02-25: qty 1

## 2018-02-25 MED ORDER — ALBUTEROL SULFATE (2.5 MG/3ML) 0.083% IN NEBU: 3.0000 mL | INHALATION_SOLUTION | Freq: Four times a day (QID) | RESPIRATORY_TRACT | Status: DC | PRN

## 2018-02-25 MED ORDER — ENOXAPARIN SODIUM 150 MG/ML ~~LOC~~ SOLN
135.0000 mg | Freq: Once | SUBCUTANEOUS | Status: AC
  Administered 2018-02-25: 135 mg via SUBCUTANEOUS
  Filled 2018-02-25: qty 0.9

## 2018-02-25 MED ORDER — INSULIN ASPART 100 UNIT/ML ~~LOC~~ SOLN
0.0000 [IU] | Freq: Three times a day (TID) | SUBCUTANEOUS | Status: DC
  Administered 2018-02-26 (×2): 2 [IU] via SUBCUTANEOUS
  Administered 2018-02-27: 3 [IU] via SUBCUTANEOUS
  Filled 2018-02-25 (×3): qty 1

## 2018-02-25 MED ORDER — MORPHINE SULFATE (PF) 2 MG/ML IV SOLN
2.0000 mg | INTRAVENOUS | Status: DC | PRN
  Administered 2018-02-25 (×2): 2 mg via INTRAVENOUS
  Filled 2018-02-25 (×3): qty 1

## 2018-02-25 MED ORDER — MORPHINE SULFATE (PF) 2 MG/ML IV SOLN
2.0000 mg | Freq: Once | INTRAVENOUS | Status: AC
  Administered 2018-02-25: 2 mg via INTRAVENOUS
  Filled 2018-02-25: qty 1

## 2018-02-25 MED ORDER — ASPIRIN 81 MG PO CHEW: 324.0000 mg | CHEWABLE_TABLET | Freq: Once | ORAL | Status: DC

## 2018-02-25 MED ORDER — NITROGLYCERIN IN D5W 200-5 MCG/ML-% IV SOLN
0.0000 ug/min | INTRAVENOUS | Status: DC
Start: 2018-02-25 — End: 2018-02-28
  Administered 2018-02-26: 5 ug/min via INTRAVENOUS
  Filled 2018-02-25: qty 250

## 2018-02-25 MED ORDER — ACETAMINOPHEN 650 MG RE SUPP: 650.0000 mg | Freq: Four times a day (QID) | RECTAL | Status: DC | PRN

## 2018-02-25 NOTE — Progress Notes (Signed)
Pt c/o 8-10/10 chest pain, NP aware, order to hold Nitro due to low BP. Will use PRN morphine, and Norco for pain management. Pt still having episodes of "passing out" after coughing episodes. No VS changes with these episodes, pt with no distress, awakens with light sternal rub and calling of patients name. Np aware. Order for Guaifenesin, given. Less episodes of apnea. Will continue to monitor.

## 2018-02-25 NOTE — Consult Note (Signed)
Cody Alexander is Alexander 62 y.o. male  [id number]  Primary Cardiologist: Cody Alexander Reason for Consultation: chest pain  HPI:[name] any distressOWM came with chest pain after being incarcerated. He says he has elephant sitting on his chest pain but while interviewing did not appear in distress.   Review of Systems: Has been having syncopal episodes   Past Medical History:  Diagnosis Date  . CHF (congestive heart failure) (HCC)   . COPD (chronic obstructive pulmonary disease) (HCC)   . Diabetes mellitus without complication (HCC)   . Hypertension     Medications Prior to Admission  Medication Sig Dispense Refill  . aspirin 325 MG EC tablet Take 325 mg by mouth once.    . cloNIDine (CATAPRES) 0.1 MG tablet Take 0.1 mg by mouth daily.    . hydrochlorothiazide (HYDRODIURIL) 25 MG tablet Take 25 mg by mouth daily.    Marland Kitchen lisinopril (PRINIVIL,ZESTRIL) 20 MG tablet Take 20 mg by mouth daily.       Marland Kitchen aspirin EC  81 mg Oral Daily  . [START ON 02/26/2018] clopidogrel  75 mg Oral Daily  . [START ON 02/26/2018] enoxaparin (LOVENOX) injection  1 mg/kg Subcutaneous Q12H  . ipratropium-albuterol  3 mL Nebulization Q6H  . metoprolol tartrate  25 mg Oral BID  .  morphine injection  2 mg Intravenous STAT    Infusions: . sodium chloride 75 mL/hr at 02/25/18 1739  . nitroGLYCERIN      Allergies  Allergen Reactions  . Erythromycin Anaphylaxis, Hives and Swelling    Social History   Socioeconomic History  . Marital status: Single    Spouse name: Not on file  . Number of children: Not on file  . Years of education: Not on file  . Highest education level: Not on file  Occupational History  . Not on file  Social Needs  . Financial resource strain: Not on file  . Food insecurity:    Worry: Not on file    Inability: Not on file  . Transportation needs:    Medical: Not on file    Non-medical: Not on file  Tobacco Use  . Smoking status: Never Smoker  . Smokeless tobacco: Never  Used  Substance and Sexual Activity  . Alcohol use: No  . Drug use: Not on file  . Sexual activity: Not on file  Lifestyle  . Physical activity:    Days per week: Not on file    Minutes per session: Not on file  . Stress: Not on file  Relationships  . Social connections:    Talks on phone: Not on file    Gets together: Not on file    Attends religious service: Not on file    Active member of club or organization: Not on file    Attends meetings of clubs or organizations: Not on file    Relationship status: Not on file  . Intimate partner violence:    Fear of current or ex partner: Not on file    Emotionally abused: Not on file    Physically abused: Not on file    Forced sexual activity: Not on file  Other Topics Concern  . Not on file  Social History Narrative  . Not on file    History reviewed. No pertinent family history.  PHYSICAL EXAM: Vitals:   02/25/18 1645 02/25/18 1729  BP: 127/80 (!) 109/96  Pulse: 61 85  Resp: 12 (!) 22  Temp:  97.7 F (36.5 C)  SpO2: 97% 99%    No intake or output data in the 24 hours ending 02/25/18 1930  General:  Well appearing. No respiratory difficulty HEENT: normal Neck: supple. no JVD. Carotids 2+ bilat; no bruits. No lymphadenopathy or thryomegaly appreciated. Cor: PMI nondisplaced. Regular rate & rhythm. No rubs, gallops or murmurs. Lungs: clear Abdomen: soft, nontender, nondistended. No hepatosplenomegaly. No bruits or masses. Good bowel sounds. Extremities: no cyanosis, clubbing, rash, edema Neuro: alert & oriented x 3, cranial nerves grossly intact. moves all 4 extremities w/o difficulty. Affect pleasant.  ECG: NSR no acute changes  Results for orders placed or performed during the hospital encounter of 02/25/18 (from the past 24 hour(s))  Basic metabolic panel     Status: Abnormal   Collection Time: 02/25/18  2:57 PM  Result Value Ref Range   Sodium 139 135 - 145 mmol/L   Potassium 4.2 3.5 - 5.1 mmol/L   Chloride  101 98 - 111 mmol/L   CO2 30 22 - 32 mmol/L   Glucose, Bld 154 (Cody) 70 - 99 mg/dL   BUN 16 8 - 23 mg/dL   Creatinine, Ser 1.611.14 0.61 - 1.24 mg/dL   Calcium 9.4 8.9 - 09.610.3 mg/dL   GFR calc non Af Amer >60 >60 mL/min   GFR calc Af Amer >60 >60 mL/min   Anion gap 8 5 - 15  CBC     Status: None   Collection Time: 02/25/18  2:57 PM  Result Value Ref Range   WBC 7.5 3.8 - 10.6 K/uL   RBC 5.11 4.40 - 5.90 MIL/uL   Hemoglobin 16.0 13.0 - 18.0 g/dL   HCT 04.547.1 40.940.0 - 81.152.0 %   MCV 92.2 80.0 - 100.0 fL   MCH 31.4 26.0 - 34.0 pg   MCHC 34.0 32.0 - 36.0 g/dL   RDW 91.413.8 78.211.5 - 95.614.5 %   Platelets 260 150 - 440 K/uL  Troponin I     Status: Abnormal   Collection Time: 02/25/18  2:57 PM  Result Value Ref Range   Troponin I 0.11 (HH) <0.03 ng/mL  Troponin I     Status: Abnormal   Collection Time: 02/25/18  6:08 PM  Result Value Ref Range   Troponin I 0.11 (HH) <0.03 ng/mL  Glucose, capillary     Status: Abnormal   Collection Time: 02/25/18  7:05 PM  Result Value Ref Range   Glucose-Capillary 121 (Cody) 70 - 99 mg/dL   Dg Chest 2 View  Result Date: 02/25/2018 CLINICAL DATA:  62 year old male who was awakened by sharp, heavy central chest pain. Left arm numbness and dizziness. EXAM: CHEST - 2 VIEW COMPARISON:  Chest radiographs 09/25/2016 and earlier. FINDINGS: Seated AP and lateral views of the chest. Lung volumes and mediastinal contours remain normal. Lung parenchyma appears stable and clear. Visualized tracheal air column is within normal limits. No pneumothorax or pleural effusion. No acute osseous abnormality identified. Negative visible bowel gas pattern. IMPRESSION: Negative.  No acute cardiopulmonary abnormality. Electronically Signed   By: Cody FlemingH  Alexander M.D.   On: 02/25/2018 15:20     ASSESSMENT AND PLAN: Chest pain with slightly elevated troponins and no EKG changes in spite of repeated chest pain episodes,no indication to take to cath lab urgently. He has no change in EKG or troponins. Will do echo  to look at wall motion and LVEF . Continue asp/plavix/lovenox, and give nitrates topically or IV and morphine and sleeping pill. He may be having drug seeking behavior. Cody Alexander

## 2018-02-25 NOTE — ED Provider Notes (Signed)
[facility] Emergency Department Provider Note   ____________________________________________   First MD Initiated Contact with Patient 02/25/18 [phone]     (approximate)  I have reviewed the triage vital signs and the nursing notes.   HISTORY  Chief Complaint Chest Pain  -  HPI Diamond Martucci is a 62 y.o. male Says that the pain started at about 1:00 this afternoon. He was awoken from sleep by sharp heavy pressure center of his chest with left arm numbness and occasional twinges of pain in his neck. [facility] along with 325 aspirin and 0.1 mg of clonidine for blood pressure was elevated. Patient says the nitroglycerin made his pain worse. Patient is groaning in pain now somewhat dramatically. Patient's troponin today is 0.11. It was 0.05 on a previous visit and in 2014 he had a troponin of 0.22. Patient reports pain is again sharp and heavy he's nauseated and somewhat dizzy at times it is not worse with deep breathing doesn't seem to be worse with exertion although he has not exerted himself much. Patient reports the pain is severe at present.   Past Medical History:  Diagnosis Date  . CHF (congestive heart failure) (HCC)   . COPD (chronic obstructive pulmonary disease) (HCC)   . Diabetes mellitus without complication (HCC)   . Hypertension     There are no active problems to display for this patient.   History reviewed. No pertinent surgical history.  Prior to Admission medications   Medication Sig Start Date End Date Taking? Authorizing Provider  albuterol (PROVENTIL HFA;VENTOLIN HFA) 108 (90 Base) MCG/ACT inhaler Inhale 2 puffs into the lungs every 6 (six) hours as needed for wheezing or shortness of breath. 09/25/16   Nita Sickle, MD  chlorpheniramine-HYDROcodone [facility]) 10-8 MG/5ML SUER Take 5 mLs by mouth at bedtime as needed for cough. 09/25/16   Nita Sickle, MD  Pseudoephedrine  HCl (SUDAFED 12 HOUR PO) Take 1 tablet by mouth daily as needed.    [provider]    Allergies Erythromycin  History reviewed. No pertinent family history.  Social History Social History   Tobacco Use  . Smoking status: Never Smoker  . Smokeless tobacco: Never Used  Substance Use Topics  . Alcohol use: No  . Drug use: Not on file    Review of Systems  Constitutional: No fever/chills Eyes: No visual changes. ENT: No sore throat. Cardiovascular:  chest pain. Respiratory: some shortness of breath. Gastrointestinal: No abdominal pain.   nausea,  vomited once  No diarrhea.  No constipation. Genitourinary: Negative for dysuria. Musculoskeletal: Negative for back pain. Skin: Negative for rash. Neurological: Negative for headaches, focal weakness  ____________________________________________   PHYSICAL EXAM:  VITAL SIGNS: ED Triage Vitals  Enc Vitals Group     BP 02/25/18 1454 (!) 160/115     Pulse Rate 02/25/18 1450 78     Resp 02/25/18 1450 20     Temp 02/25/18 1450 97.7 F (36.5 C)     Temp Source 02/25/18 1450 Oral     SpO2 02/25/18 1450 96 %     Weight 02/25/18 1452 300 lb (136.1 kg)     Height 02/25/18 1452 6\' 1"  (1.854 m)     Head Circumference --      Peak Flow --      Pain Score 02/25/18 1452 8     Pain Loc --      Pain Edu? --  Excl. in GC? --     Constitutional: Alert and oriented.in acute distress. Eyes: Conjunctivae are normal. PERRL. EOMI. Head: Atraumatic. Nose: No congestion/rhinnorhea. Mouth/Throat: Mucous membranes are moist.  Oropharynx non-erythematous. Neck: No stridor. Cardiovascular: Normal rate, regular rhythm. Grossly normal heart sounds.  Good peripheral circulation. Respiratory: Normal respiratory effort.  No retractions. Lungs CTAB. Gastrointestinal: Soft and nontender. No distention. No abdominal bruits. No CVA tenderness. Musculoskeletal: No lower extremity tenderness nor edema.  No joint effusions. Neurologic:   Normal speech and language. No gross focal neurologic deficits are appreciated. No gait instability. Skin:  Skin is warm, dry and intact. No rash noted. Psychiatric: Mood and affect are normal. Speech and behavior are normal.  ____________________________________________   LABS (all labs ordered are listed, but only abnormal results are displayed)  Labs Reviewed  BASIC METABOLIC PANEL - Abnormal; Notable for the following components:      Result Value   Glucose, Bld 154 (*)    All other components within normal limits  TROPONIN I - Abnormal; Notable for the following components:   Troponin I 0.11 (*)    All other components within normal limits  CBC  TROPONIN I   ____________________________________________  EKG  EKG read and interpreted by me shows normal sinus rhythm rate of 83 normal axis flipped T waves laterally which are all old ____________________________________________  RADIOLOGY  ED MD interpretation:asked x-ray read by radiology reviewed by me shows no acute disease  Official radiology report(s): Dg Chest 2 View  Result Date: 02/25/2018 CLINICAL DATA:  62 year old male who was awakened by sharp, heavy central chest pain. Left arm numbness and dizziness. EXAM: CHEST - 2 VIEW COMPARISON:  Chest radiographs 09/25/2016 and earlier. FINDINGS: Seated AP and lateral views of the chest. Lung volumes and mediastinal contours remain normal. Lung parenchyma appears stable and clear. Visualized tracheal air column is within normal limits. No pneumothorax or pleural effusion. No acute osseous abnormality identified. Negative visible bowel gas pattern. IMPRESSION: Negative.  No acute cardiopulmonary abnormality. Electronically Signed   By: Odessa FlemingH  Hall M.D.   On: 02/25/2018 15:20    ____________________________________________   PROCEDURES  Procedure(s) performed:   Procedures  Critical Care performed: critical care time 20 minutes. This includes discussing the patient with the  officers briefly checking on the patient going back checking on him again talking to the hospitalist and to the cardiologist. It also includes reviewing the patient's old records.  ____________________________________________   INITIAL IMPRESSION / ASSESSMENT AND PLAN / ED COURSE  because the patient's age and risk factors we will put the patient in the hospital and monitor him Dr. Park BreedKahn wants to start him on Lovenox which will be done on giving him some IV fluids and effort to raise his blood pressure from the 100/77 is. I have given him 2 of IV morphine with some IV Zofran and then if his pressure comes up some what more I will give him some nitro paste half inch. We'll plan on admitting him to the hospital as I mentioned.      ____________________________________________   FINAL CLINICAL IMPRESSION(S) / ED DIAGNOSES  Final diagnoses:  NSTEMI (non-ST elevated myocardial infarction) [facility](HCC)     ED Discharge Orders    None       Note:  This document was prepared using Dragon voice recognition software and may include unintentional dictation errors.    Arnaldo NatalMalinda, Savilla Turbyfill F, MD 02/25/18 [phone]

## 2018-02-25 NOTE — Progress Notes (Addendum)
[facility], 2 detention officer at bedside. Pt transferred from ED complaining of excruciating CP 10/10, per pt feels "like and elephant is sitting on his chest" he also is having periods where he "passes out" for roughly about 3 secs. STAT EKG done on patient.  Dr. Luberta MutterKonidena notified, orders for 2 mg morphine  PRN.

## 2018-02-25 NOTE — ED Notes (Signed)
Pt placed in Trendelenburg due to hypotension.

## 2018-02-25 NOTE — ED Notes (Signed)
PT taken to XR 

## 2018-02-25 NOTE — Progress Notes (Addendum)
Patient still has chest pain, Dr. Adrian BlackwaterShaukat Khan recommended nitro drip however patient has hypotension so we will transfer the patient to ICU for hemodynamic monitoring and need ; nitro drip.spoke with DR.  Conforti.

## 2018-02-25 NOTE — H&P (Addendum)
[facility] Physicians - [place] at [facility]   [name]    MR#:  [id number]  DATE OF BIRTH:  1956-07-26  DATE OF ADMISSION:  02/25/2018  PRIMARY CARE PHYSICIAN: System, Pcp Not In   REQUESTING/REFERRING PHYSICIAN:  CHIEF COMPLAINT:   Chief Complaint  Patient presents with  . Chest Pain    HISTORY OF PRESENT ILLNESS:  Cody Alexander  is a 62 y.o. male with a known history of type Beatties mellitus type II, COPD who is a prisoner brought in because of midsternal chest pain that started since last night.  Patient complains of heaviness in the chest like elephant sitting on her chest associated with discomfort radiating to left arm, and, diaphoresis.  Has been having cough for almost 1 year but since he is in the prison, are not giving him his inhalers that he usually takes.  No fever.  Patient received aspirin in prison.  He received IV morphine in the emergency room for chest pain that dropped her BP transiently so he received fluid.  Patient first troponin 0 0.11 without any EKG changes.  Possible cardiac cath on Friday due to lo July 4 holiday.  Dr. Wynelle Link Khan,recommended that patient can have full dose lovenox,ASA.nitro paste. PAST MEDICAL HISTORY:   Past Medical History:  Diagnosis Date  . CHF (congestive heart failure) (HCC)   . COPD (chronic obstructive pulmonary disease) (HCC)   . Diabetes mellitus without complication (HCC)   . Hypertension     PAST SURGICAL HISTOIRY:  History reviewed. No pertinent surgical history.  SOCIAL HISTORY:   Social History   Tobacco Use  . Smoking status: Never Smoker  . Smokeless tobacco: Never Used  Substance Use Topics  . Alcohol use: No    FAMILY HISTORY:  History reviewed. No pertinent family history.  DRUG ALLERGIES:   Allergies  Allergen Reactions  . Erythromycin Anaphylaxis, Hives and Swelling    REVIEW OF SYSTEMS:  CONSTITUTIONAL: No fever, fatigue or weakness.  EYES: No  blurred or double vision.  EARS, NOSE, AND THROAT: No tinnitus or ear pain.  RESPIRATORY: Cough, midsternal chest pain CARDIOVASCULAR: Midsternal chest pain since last night  gASTROINTESTINAL: Some nausea but no abdominal pain. GENITOURINARY: No dysuria, hematuria.  ENDOCRINE: No polyuria, nocturia,  HEMATOLOGY: No anemia, easy bruising or bleeding SKIN: No rash or lesion. MUSCULOSKELETAL: No joint pain or arthritis.   NEUROLOGIC: No tingling, numbness, weakness.  PSYCHIATRY: No anxiety or depression.   MEDICATIONS AT HOME:   Prior to Admission medications   Medication Sig Start Date End Date Taking? Authorizing Provider  albuterol (PROVENTIL HFA;VENTOLIN HFA) 108 (90 Base) MCG/ACT inhaler Inhale 2 puffs into the lungs every 6 (six) hours as needed for wheezing or shortness of breath. 09/25/16   Nita Sickle, MD  chlorpheniramine-HYDROcodone [facility]) 10-8 MG/5ML SUER Take 5 mLs by mouth at bedtime as needed for cough. 09/25/16   Nita Sickle, MD  Pseudoephedrine HCl (SUDAFED 12 HOUR PO) Take 1 tablet by mouth daily as needed.    [provider]      VITAL SIGNS:  Blood pressure 107/81, pulse 78, temperature 97.7 F (36.5 C), temperature source Oral, resp. rate 13, height 6\' 1"  (1.854 m), weight 136.1 kg (300 lb), SpO2 98 %.  PHYSICAL EXAMINATION:  GENERAL:  63 y.o.-year-old patient lying in the bed with no acute distress.  EYES: Pupils equal, round, reactive to light and accommodation.  Extraocular muscles intact.  HEENT: Head atraumatic, normocephalic. Oropharynx and nasopharynx  clear.  NECK:  Supple, no jugular venous distention. No thyroid enlargement, no tenderness.  LUNGS: faint expiratory wheeze bilaterally.  CARDIOVASCULAR: S1, S2 normal. No murmurs, rubs, or gallops.  ABDOMEN: Soft, nontender, nondistended. Bowel sounds present. No organomegaly or mass.  EXTREMITIES: No pedal edema, cyanosis, or clubbing.  NEUROLOGIC: Cranial nerves II  through XII are intact. Muscle strength 5/5 in all extremities. Sensation intact. Gait not checked.  PSYCHIATRIC: The patient is alert and oriented x 3.  SKIN: No obvious rash, lesion, or ulcer.   LABORATORY PANEL:   CBC Recent Labs  Lab 02/25/18 1457  WBC 7.5  HGB 16.0  HCT 47.1  PLT 260   ------------------------------------------------------------------------------------------------------------------  Chemistries  Recent Labs  Lab 02/25/18 1457  NA 139  K 4.2  CL 101  CO2 30  GLUCOSE 154*  BUN 16  CREATININE 1.14  CALCIUM 9.4   ------------------------------------------------------------------------------------------------------------------  Cardiac Enzymes Recent Labs  Lab 02/25/18 1457  TROPONINI 0.11*   ------------------------------------------------------------------------------------------------------------------  RADIOLOGY:  Dg Chest 2 View  Result Date: 02/25/2018 CLINICAL DATA:  62 year old male who was awakened by sharp, heavy central chest pain. Left arm numbness and dizziness. EXAM: CHEST - 2 VIEW COMPARISON:  Chest radiographs 09/25/2016 and earlier. FINDINGS: Seated AP and lateral views of the chest. Lung volumes and mediastinal contours remain normal. Lung parenchyma appears stable and clear. Visualized tracheal air column is within normal limits. No pneumothorax or pleural effusion. No acute osseous abnormality identified. Negative visible bowel gas pattern. IMPRESSION: Negative.  No acute cardiopulmonary abnormality. Electronically Signed   By: Odessa FlemingH  Hall M.D.   On: 02/25/2018 15:20    EKG:   Orders placed or performed during the hospital encounter of 02/25/18  . ED EKG within 10 minutes  . ED EKG within 10 minutes  . EKG 12-Lead  . EKG 12-Lead  EKG shows sinus rhythm with 83 bpm /flipped T waves laterally which are old. IMPRESSION AND PLAN:  62 year old male patient with history of COPD comes from presented with midsternal chest pressure found  to have no EKG changes but slightly elevated troponins.  1. non-ST elevation MI: Because of his advanced age, his chest pain symptoms are concerning for ACS.  Case discussed with Dr. Adrian BlackwaterShaukat Khan from cardiology.  He recommended to cycle troponins, continue full dose Lovenox, aspirin, give him a loading dose of Plavix at 300 mg 1 time, from tomorrow patient can get Plavix 75 mg daily, continue small dose beta-blockers, Nitropaste, possible cardiac cath on Friday.  Unable to get cardiac cath tomorrow  because of July 4th holiday.  2/COPD, patient quit smoking, drinking complaints of cough, pleuritic chest pain.  Chest x-ray negative.  Add DuoNeb's.  3.  Diabetes mellitus type 2: Check hemoglobin A1c, add sliding scale insulin with coverage.  [facility]  .  All the records are reviewed and case discussed with ED provider. Management plans discussed with the patient, family and they are in agreement.  CODE STATUS: full  TOTAL TIME TAKING CARE OF THIS PATIENT: minutes.    Katha HammingSnehalatha Stepehn Eckard M.D on 02/25/2018 at 4:14 PM  Between 7am to 6pm - Pager - [phone]  After 6pm go to [email or web address] - password [email or web address]  [facility]  Office  [phone]  CC: Primary care physician; System, Pcp Not In  Note: This dictation was prepared with Dragon dictation along with smaller phrase technology. Any transcriptional errors that result from this process are unintentional.

## 2018-02-25 NOTE — ED Triage Notes (Signed)
Pt is an inmate from the jail, reports that he was sleeping when he was awakened by sharp heavy pressure to the center of chest. Pt reports also that he is having some left arm numbness. Pt was given 325mg  ASA, 0.1mg  Clonidine, and 1SL NTG pta. Pt reports NTG worsened pain.

## 2018-02-25 NOTE — Consult Note (Addendum)
Name: Cody BernardCharles Alexander MRN: [id number] DOB: 1956/03/01    ADMISSION DATE:  02/25/2018 CONSULTATION DATE: 02/25/2018  REFERRING MD : Dr. Luberta MutterKonidena   CHIEF COMPLAINT: Chest Pain   BRIEF PATIENT DESCRIPTION: 62 yo old male admitted with NSTEMI possibly requiring Nitroglycerin gtt  SIGNIFICANT EVENTS/STUDIES:  07/3 Pt admitted to stepdown unit   HISTORY OF PRESENT ILLNESS:   This is a 62 yo male with a PMH of HTN, Diabetes Mellitus, COPD, Polysubstance Abuse, and CHF.  [facility] jail with c/o sharp heavy pressure at the center of his chest with left arm numbness and occasional pain in his neck onset of symptoms 07/3 at 1300.  He received sublingual nitroglycerin, 325 mg aspirin, and 0.1 mg of clonidine for elevated bp at the jail.  He's been in jail since 02/16/18, and he states he's only received his bp medications. Per ER notes he stated the sublingual nitroglycerin made his pain worse.  Upon arrival to ER pt c/o nausea, persistent chest pain, and dizziness. He also endorses intermittent syncopal episodes during coughing episodes this is not new. His initial troponin was 0.11 and EKG revealed NSR with heart rate 83, normal axis and inverted T wave which are not new findings.  Cardiology consulted and recommended nitroglycerin gtt, however due to hypotension drip never started.  He was subsequently admitted to the stepdown unit by hospitalist team for further workup and treatment.   PAST MEDICAL HISTORY :   has a past medical history of CHF (congestive heart failure) (HCC), COPD (chronic obstructive pulmonary disease) (HCC), Diabetes mellitus without complication (HCC), and Hypertension.  has no past surgical history on file. Prior to Admission medications   Medication Sig Start Date End Date Taking? Authorizing Provider  aspirin 325 MG EC tablet Take 325 mg by mouth once.   Yes [provider]  cloNIDine (CATAPRES) 0.1 MG tablet Take 0.1 mg by mouth daily.    Yes [provider]  hydrochlorothiazide (HYDRODIURIL) 25 MG tablet Take 25 mg by mouth daily.   Yes [provider]  lisinopril (PRINIVIL,ZESTRIL) 20 MG tablet Take 20 mg by mouth daily.   Yes [provider]   Allergies  Allergen Reactions  . Erythromycin Anaphylaxis, Hives and Swelling    FAMILY HISTORY:  family history is not on file. SOCIAL HISTORY:  reports that he has never smoked. He has never used smokeless tobacco. He reports that he does not drink alcohol.  REVIEW OF SYSTEMS: Positives in BOLD  Constitutional: Negative for fever, chills, weight loss, malaise/fatigue and diaphoresis.  HENT: Negative for hearing loss, ear pain, nosebleeds, congestion, sore throat, neck pain, tinnitus and ear discharge.   Eyes: Negative for blurred vision, double vision, photophobia, pain, discharge and redness.  Respiratory: chronic cough, hemoptysis, sputum production, shortness of breath, wheezing and stridor.   Cardiovascular: chest pain with left arm numbness, palpitations, orthopnea, claudication, leg swelling and PND.  Gastrointestinal: Negative for heartburn, nausea, vomiting, abdominal pain, diarrhea, constipation, blood in stool and melena.  Genitourinary: Negative for dysuria, urgency, frequency, hematuria and flank pain.  Musculoskeletal: Negative for myalgias, back pain, joint pain and falls.  Skin: Negative for itching and rash.  Neurological: syncope, dizziness, tingling, tremors, sensory change, speech change, focal weakness, seizures, loss of consciousness, weakness and headaches.  Endo/Heme/Allergies: Negative for environmental allergies and polydipsia. Does not bruise/bleed easily.  SUBJECTIVE:  c/o chest pain and chronic cough   VITAL SIGNS: Temp:  [97.7 F (36.5 C)] 97.7 F (36.5  C) (07/03 1729) Pulse Rate:  [59-85] 85 (07/03 1729) Resp:  [9-24] 22 (07/03 1729) BP: (91-160)/(57-115) 109/96 (07/03 1729) SpO2:  [82 %-99 %] 99 % (07/03  1729) Weight:  [136.1 kg (300 lb)] 136.1 kg (300 lb) (07/03 1452)  PHYSICAL EXAMINATION: General: well developed, well nourished male, NAD  Neuro: alert and oriented, follows commands  HEENT: supple, no JVD  Cardiovascular: nsr with PVC's, rrr, no R/G  Lungs: expiratory wheezes RLL and clear throughout all other lobes, even, non labored  Abdomen: +BS x4, obese, non distended, non tender  Musculoskeletal: normal bulk and tone, no edema  Skin: intact no rashes or lesions, handcuff present right ankle skin intact no bruising or skin breakdown  Recent Labs  Lab 02/25/18 1457  NA 139  K 4.2  CL 101  CO2 30  BUN 16  CREATININE 1.14  GLUCOSE 154*   Recent Labs  Lab 02/25/18 1457  HGB 16.0  HCT 47.1  WBC 7.5  PLT 260   Dg Chest 2 View  Result Date: 02/25/2018 CLINICAL DATA:  62 year old male who was awakened by sharp, heavy central chest pain. Left arm numbness and dizziness. EXAM: CHEST - 2 VIEW COMPARISON:  Chest radiographs 09/25/2016 and earlier. FINDINGS: Seated AP and lateral views of the chest. Lung volumes and mediastinal contours remain normal. Lung parenchyma appears stable and clear. Visualized tracheal air column is within normal limits. No pneumothorax or pleural effusion. No acute osseous abnormality identified. Negative visible bowel gas pattern. IMPRESSION: Negative.  No acute cardiopulmonary abnormality. Electronically Signed   By: Odessa Fleming M.D.   On: 02/25/2018 15:20    ASSESSMENT / PLAN: NSTEMI  Acute Pain  Diabetes Mellitus  Hx: CHF, HTN, and COPD  P: Supplemental O2 for dyspnea and/or hypoxia  Scheduled and prn bronchodilator therapy  Continuous telemetry monitoring  Trend troponin's  Will hold nitroglycerin gtt for now due to hypotension  Hold antihypertensives and continue all other cardiac medications  Cardiology consulted appreciate input  VTE px: subq lovenox  Trend CBC  Monitor for s/sx of bleeding and transfuse for hgb <8 Trend BMP  Replace  electrolytes as indicated Monitor UOP  NS @75  ml/hr  Prn morphine and norco for pain management  [facility]  Sonda Rumble, AGNP  Pulmonary/Critical Care Pager [phone] (please enter 7 digits) PCCM Consult Pager [phone] (please enter 7 digits)

## 2018-02-25 NOTE — ED Notes (Signed)
Patient transported to X-ray 

## 2018-02-25 NOTE — Progress Notes (Signed)
*  PRELIMINARY RESULTS* Echocardiogram 2D Echocardiogram has been performed. Definity IV Contrast used on this study.  Cody Alexander 02/25/2018, 8:54 PM

## 2018-02-25 NOTE — Progress Notes (Signed)
Per Dr. Luberta MutterKonidena transfer patient to CCU, for nitro gtt, however bp is running soft. Will transfer pt to CCU, [facility]

## 2018-02-26 DIAGNOSIS — R079 Chest pain, unspecified: Secondary | ICD-10-CM

## 2018-02-26 LAB — CBC
HEMATOCRIT: 41 % (ref 40.0–52.0)
Hemoglobin: 14.2 g/dL (ref 13.0–18.0)
MCH: 31.8 pg (ref 26.0–34.0)
MCHC: 34.6 g/dL (ref 32.0–36.0)
MCV: 91.9 fL (ref 80.0–100.0)
PLATELETS: 192 10*3/uL (ref 150–440)
RBC: 4.46 MIL/uL (ref 4.40–5.90)
RDW: 13.9 % (ref 11.5–14.5)
WBC: 5.6 10*3/uL (ref 3.8–10.6)

## 2018-02-26 LAB — LIPID PANEL
Cholesterol: 131 mg/dL (ref 0–200)
HDL: 30 mg/dL — ABNORMAL LOW (ref >40)
LDL CALC: 79 mg/dL (ref 0–99)
Total CHOL/HDL Ratio: 4.4 RATIO
Triglycerides: 109 mg/dL (ref <150)
VLDL: 22 mg/dL (ref 0–40)

## 2018-02-26 LAB — GLUCOSE, CAPILLARY
GLUCOSE-CAPILLARY: 123 mg/dL — AB (ref 70–99)
Glucose-Capillary: 101 mg/dL — ABNORMAL HIGH (ref 70–99)
Glucose-Capillary: 128 mg/dL — ABNORMAL HIGH (ref 70–99)
Glucose-Capillary: 183 mg/dL — ABNORMAL HIGH (ref 70–99)

## 2018-02-26 LAB — HIV ANTIBODY (ROUTINE TESTING W REFLEX): HIV Screen 4th Generation wRfx: NONREACTIVE

## 2018-02-26 LAB — BASIC METABOLIC PANEL
ANION GAP: 9 (ref 5–15)
BUN: 19 mg/dL (ref 8–23)
CALCIUM: 8.6 mg/dL — AB (ref 8.9–10.3)
CO2: 28 mmol/L (ref 22–32)
Chloride: 104 mmol/L (ref 98–111)
Creatinine, Ser: 1.2 mg/dL (ref 0.61–1.24)
GFR calc Af Amer: >60 mL/min (ref >60)
GLUCOSE: 111 mg/dL — AB (ref 70–99)
POTASSIUM: 3.3 mmol/L — AB (ref 3.5–5.1)
SODIUM: 141 mmol/L (ref 135–145)

## 2018-02-26 LAB — TROPONIN I
TROPONIN I: 0.09 ng/mL — AB (ref <0.03)
TROPONIN I: 0.09 ng/mL — AB (ref <0.03)

## 2018-02-26 MED ORDER — MORPHINE SULFATE (PF) 2 MG/ML IV SOLN
2.0000 mg | INTRAVENOUS | Status: DC | PRN
  Administered 2018-02-26: 4 mg via INTRAVENOUS
  Filled 2018-02-26: qty 2

## 2018-02-26 MED ORDER — MORPHINE SULFATE (PF) 2 MG/ML IV SOLN
1.0000 mg | INTRAVENOUS | Status: DC | PRN
  Administered 2018-02-26 (×2): 1 mg via INTRAVENOUS
  Filled 2018-02-26 (×2): qty 1

## 2018-02-26 MED ORDER — LEVOFLOXACIN 500 MG PO TABS
500.0000 mg | ORAL_TABLET | Freq: Every day | ORAL | Status: DC
  Administered 2018-02-26: 500 mg via ORAL
  Filled 2018-02-26 (×2): qty 1

## 2018-02-26 MED ORDER — TIOTROPIUM BROMIDE MONOHYDRATE 18 MCG IN CAPS
18.0000 ug | ORAL_CAPSULE | Freq: Every day | RESPIRATORY_TRACT | Status: DC
  Administered 2018-02-26 – 2018-02-27 (×2): 18 ug via RESPIRATORY_TRACT
  Filled 2018-02-26: qty 5

## 2018-02-26 MED ORDER — POTASSIUM CHLORIDE CRYS ER 20 MEQ PO TBCR
20.0000 meq | EXTENDED_RELEASE_TABLET | ORAL | Status: AC
  Administered 2018-02-26 (×2): 20 meq via ORAL
  Filled 2018-02-26 (×2): qty 1

## 2018-02-26 MED ORDER — BUDESONIDE 0.25 MG/2ML IN SUSP: 0.2500 mg | Freq: Two times a day (BID) | RESPIRATORY_TRACT | Status: DC

## 2018-02-26 MED ORDER — PHENOL 1.4 % MT LIQD
1.0000 | OROMUCOSAL | Status: DC | PRN
  Administered 2018-02-26: 1 via OROMUCOSAL
  Filled 2018-02-26: qty 177

## 2018-02-26 MED ORDER — ALUM & MAG HYDROXIDE-SIMETH 200-200-20 MG/5ML PO SUSP
30.0000 mL | ORAL | Status: DC | PRN
  Filled 2018-02-26: qty 30

## 2018-02-26 MED ORDER — BUDESONIDE 0.25 MG/2ML IN SUSP
0.2500 mg | Freq: Two times a day (BID) | RESPIRATORY_TRACT | Status: DC
  Administered 2018-02-26 – 2018-02-27 (×3): 0.25 mg via RESPIRATORY_TRACT
  Filled 2018-02-26 (×4): qty 2

## 2018-02-26 MED ORDER — IPRATROPIUM-ALBUTEROL 0.5-2.5 (3) MG/3ML IN SOLN
3.0000 mL | Freq: Four times a day (QID) | RESPIRATORY_TRACT | Status: DC | PRN
Start: 2018-02-26 — End: 2018-02-28
  Administered 2018-02-27: 3 mL via RESPIRATORY_TRACT

## 2018-02-26 MED ORDER — MOMETASONE FURO-FORMOTEROL FUM 100-5 MCG/ACT IN AERO
2.0000 | INHALATION_SPRAY | Freq: Two times a day (BID) | RESPIRATORY_TRACT | Status: DC
  Administered 2018-02-26 – 2018-02-27 (×3): 2 via RESPIRATORY_TRACT
  Filled 2018-02-26: qty 8.8

## 2018-02-26 MED ORDER — MORPHINE SULFATE (PF) 2 MG/ML IV SOLN
2.0000 mg | INTRAVENOUS | Status: DC | PRN
  Administered 2018-02-26 – 2018-02-27 (×4): 2 mg via INTRAVENOUS
  Filled 2018-02-26 (×4): qty 1

## 2018-02-26 MED ORDER — HYDROMORPHONE HCL 1 MG/ML IJ SOLN
1.0000 mg | Freq: Once | INTRAMUSCULAR | Status: AC
  Administered 2018-02-26: 1 mg via INTRAVENOUS
  Filled 2018-02-26: qty 1

## 2018-02-26 NOTE — Progress Notes (Signed)
[facility] Physicians - [place] at [facility]   [name]    MR#:  [id number]  DATE OF BIRTH:  05/08/1956  SUBJECTIVE: Patient admitted yesterday for chest pain, transferred to ICU because of persistent midsternal chest discomfort and requiring nitro drip.  Patient blood pressure was low so nitro drip was not started.  Patient was on nitro drip for only for 2 hours this morning and nitro drip discontinued as patient mentioned that its not helping him.  Required morphine for chest pain that caused him to have hypoxia, respiratory depression so he is requiring BiPAP now.  Patient is alert and able to tell me that he has some cough and still has some chest discomfort.  Troponins have been negative.  CHIEF COMPLAINT:   Chief Complaint  Patient presents with  . Chest Pain    REVIEW OF SYSTEMS:   ROS CONSTITUTIONAL: No fever, fatigue or weakness.  EYES: No blurred or double vision.  EARS, NOSE, AND THROAT: No tinnitus or ear pain.  RESPIRATORY: Cough cARDIOVASCULAR: Midsternal chest discomfort, cough gASTROINTESTINAL: No nausea, vomiting, diarrhea or abdominal pain.  GENITOURINARY: No dysuria, hematuria.  ENDOCRINE: No polyuria, nocturia,  HEMATOLOGY: No anemia, easy bruising or bleeding SKIN: No rash or lesion. MUSCULOSKELETAL: No joint pain or arthritis.   NEUROLOGIC: No tingling, numbness, weakness.  PSYCHIATRY: Anxious DRUG ALLERGIES:   Allergies  Allergen Reactions  . Erythromycin Anaphylaxis, Hives and Swelling    VITALS:  Blood pressure (!) 125/92, pulse (!) 54, temperature 97.8 F (36.6 C), temperature source Oral, resp. rate (!) 0, height 6\' 1"  (1.854 m), weight 129.6 kg (285 lb 11.5 oz), SpO2 99 %.  PHYSICAL EXAMINATION:  GENERAL:  62 y.o.-year-old patient lying in the bed with no acute distress.  EYES: Pupils equal, round, reactive to light and accommodation. No scleral icterus. Extraocular muscles intact.  HEENT: Head atraumatic,  normocephalic. Oropharynx and nasopharynx clear.  NECK:  Supple, no jugular venous distention. No thyroid enlargement, no tenderness.  LUNGS: Nor patient has faint expiratory wheeze bilaterally.  CARDIOVASCULAR: S1, S2 normal. No murmurs, rubs, or gallops.  ABDOMEN: Soft, nontender, nondistended. Bowel sounds present. No organomegaly or mass.  EXTREMITIES: No pedal edema, cyanosis, or clubbing.  NEUROLOGIC: Cranial nerves II through XII are intact. Muscle strength 5/5 in all extremities. Sensation intact. Gait not checked.  PSYCHIATRIC: The patient is alert and oriented x 3.  SKIN: No obvious rash, lesion, or ulcer.    LABORATORY PANEL:   CBC Recent Labs  Lab 02/26/18 0506  WBC 5.6  HGB 14.2  HCT 41.0  PLT 192   ------------------------------------------------------------------------------------------------------------------  Chemistries  Recent Labs  Lab 02/26/18 0506  NA 141  K 3.3*  CL 104  CO2 28  GLUCOSE 111*  BUN 19  CREATININE 1.20  CALCIUM 8.6*   ------------------------------------------------------------------------------------------------------------------  Cardiac Enzymes Recent Labs  Lab 02/26/18 0506  TROPONINI 0.09*   ------------------------------------------------------------------------------------------------------------------  RADIOLOGY:  Dg Chest 2 View  Result Date: 02/25/2018 CLINICAL DATA:  62 year old male who was awakened by sharp, heavy central chest pain. Left arm numbness and dizziness. EXAM: CHEST - 2 VIEW COMPARISON:  Chest radiographs 09/25/2016 and earlier. FINDINGS: Seated AP and lateral views of the chest. Lung volumes and mediastinal contours remain normal. Lung parenchyma appears stable and clear. Visualized tracheal air column is within normal limits. No pneumothorax or pleural effusion. No acute osseous abnormality identified. Negative visible bowel gas pattern. IMPRESSION: Negative.  No acute cardiopulmonary abnormality.  Electronically Signed   By: Rexene Edison  Margo AyeHall M.D.   On: 02/25/2018 15:20    EKG:   Orders placed or performed during the hospital encounter of 02/25/18  . ED EKG within 10 minutes  . ED EKG within 10 minutes  . EKG 12-Lead  . EKG 12-Lead  . EKG 12-Lead  . EKG 12-Lead  . EKG 12-Lead  . EKG 12-Lead  . EKG 12-Lead  . EKG 12-Lead    ASSESSMENT AND PLAN:   62 year old male patient who is from prison comes with heaviness in the chest, initial concern for ACS.  1.  Heaviness in the chest secondary to acute coronary syndrome ;: Mildly elevated troponins, at 0.09 without EKG changes patient did not tolerate the nitro drip.  Seen by cardiology, scheduled for cardiac cath tomorrow morning.  Continue aspirin, beta-blockers, statins, patient EF is 60% with normal wall motion, mild MR, TR.,  Continue IV fluids, continue Plavix.   2. respiratory distress secondary to narcotics: Continue BiPAP and monitor in stepdown status   #3..mild hypokalemia replace the potassium. 4..  Acute bronchitis with cough, history of COPD: Started on inhalers, empiric antibiotics.   All the records are reviewed and case discussed with Care Management/Social Workerr. Management plans discussed with the patient, family and they are in agreement.  CODE STATUS: Full code  TOTAL TIME TAKING CARE OF THIS PATIENT: 35 minutes.   POSSIBLE D/C IN 1-2DAYS, DEPENDING ON CLINICAL CONDITION.   Katha HammingSnehalatha Hadiya Spoerl M.D on 02/26/2018 at 12:27 PM  Between 7am to 6pm - Pager - [phone]  After 6pm go to [email or web address] - password [email or web address]  [facility]  Office  [phone]  CC: Primary care physician; System, Pcp Not In   Note: This dictation was prepared with Dragon dictation along with smaller phrase technology. Any transcriptional errors that result from this process are unintentional.

## 2018-02-26 NOTE — Progress Notes (Signed)
Pt chaged to BiPAP due to periods of apnea

## 2018-02-26 NOTE — Progress Notes (Signed)
NP made aware of increased episodes of apnea. sats begin to drop, pt awakens and gasps and sats return to baseline. CPAP started, still episodes of apnea, Bipap ordered.  CP still 8/10, no new orders at this time. PT switched to Bipap by RT.

## 2018-02-26 NOTE — Progress Notes (Signed)
Pt became nauseous, RN removed from BiPAP and pt was placed on 2Lnc

## 2018-02-26 NOTE — Progress Notes (Signed)
SUBJECTIVE: patient continues to have intermittent chest pain   Vitals:   02/26/18 0600 02/26/18 0700 02/26/18 0800 02/26/18 0910  BP: 113/82 131/78 125/87 (!) 125/92  Pulse: 62 68 63 (!) 54  Resp: 14 14 13  (!) 0  Temp:      TempSrc:      SpO2: 92% 95% 99% 99%  Weight:      Height:        Intake/Output Summary (Last 24 hours) at 02/26/2018 1147 Last data filed at 02/26/2018 1003 Gross per 24 hour  Intake 1166.7 ml  Output 500 ml  Net 666.7 ml    LABS: Basic Metabolic Panel: Recent Labs    02/25/18 1457 02/26/18 0506  NA 139 141  K 4.2 3.3*  CL 101 104  CO2 30 28  GLUCOSE 154* 111*  BUN 16 19  CREATININE 1.14 1.20  CALCIUM 9.4 8.6*   Liver Function Tests: No results for input(s): AST, ALT, ALKPHOS, BILITOT, PROT, ALBUMIN in the last 72 hours. No results for input(s): LIPASE, AMYLASE in the last 72 hours. CBC: Recent Labs    02/25/18 1457 02/26/18 0506  WBC 7.5 5.6  HGB 16.0 14.2  HCT 47.1 41.0  MCV 92.2 91.9  PLT 260 192   Cardiac Enzymes: Recent Labs    02/25/18 1808 02/26/18 0126 02/26/18 0506  TROPONINI 0.11* 0.09* 0.09*   BNP: Invalid input(s): POCBNP D-Dimer: No results for input(s): DDIMER in the last 72 hours. Hemoglobin A1C: Recent Labs    02/25/18 1808  HGBA1C 8.3*   Fasting Lipid Panel: Recent Labs    02/26/18 0506  CHOL 131  HDL 30*  LDLCALC 79  TRIG [id number]  CHOLHDL 4.4   Thyroid Function Tests: No results for input(s): TSH, T4TOTAL, T3FREE, THYROIDAB in the last 72 hours.  Invalid input(s): FREET3 Anemia Panel: No results for input(s): VITAMINB12, FOLATE, FERRITIN, TIBC, IRON, RETICCTPCT in the last 72 hours.   PHYSICAL EXAM General: Well developed, well nourished, in no acute distress HEENT:  Normocephalic and atramatic Neck:  No JVD.  Lungs: Clear bilaterally to auscultation and percussion. Heart: HRRR . Normal S1 and S2 without gallops or murmurs.  Abdomen: Bowel sounds are positive, abdomen soft and non-tender   Msk:  Back normal, normal gait. Normal strength and tone for age. Extremities: No clubbing, cyanosis or edema.   Neuro: Alert and oriented X 3. Psych:  Good affect, responds appropriately  TELEMETRY: sinus rhythm  ASSESSMENT AND PLAN: acute coronary syndrome with mildly elevated troponin and EKG changes but patient continues to have recurrent chest pain. Troponin actually is trending down . We will set up for cardiac catheterization in a.m.  Active Problems:   Non-ST elevation MI (NSTEMI) (HCC)    Cody Alexander A, MD, [facility] 02/26/2018 11:47 AM

## 2018-02-26 NOTE — Progress Notes (Signed)
   02/26/18 2000  Clinical Encounter Type  Visited With Patient (Guard)  Visit Type Follow-up;Spiritual support   Paged to ICU to visit with patient.  Guard present in the room.  Patient concerned about cardiac catheterization scheduled for tomorrow, says his father died after a similar procedure.  Chaplain held patient's hand and prayed for his peace of mind and physical healing.

## 2018-02-26 NOTE — Progress Notes (Signed)
Pt given dilaudid and EKG obtain. No concerning EKG changes after clinician reviewed. Troponin's trending down. Reported during EKG, pt went unresponsive/apnnic with no VS changes, guard at beside talking about something on TV and after a little but patient awoke and was aware of what guard had said during episode of unresponsiveness.   Pt reports 8/10 chest pain after dilaudid and cough suppressant. PT states "dilaudid helped" but still reports 8/10 chest pain. Stating "can you give me anything else for pain?" Spoke with NP, since no pain meds currently help as expressed by patient, will start Nitro since patients pressures will now tolerate to see if that will bring patient some relief. Plan to reassess after Nitro started and if no relief will start back prn morphine.

## 2018-02-26 NOTE — Progress Notes (Signed)
While rounding, Chaplain offered silent, energetic prayer for the patient and guard.

## 2018-02-27 ENCOUNTER — Encounter: Payer: Self-pay | Admitting: *Deleted

## 2018-02-27 ENCOUNTER — Encounter: Admission: EM | Disposition: A | Discharge status: Home / Self Care | Payer: Self-pay | Attending: Internal Medicine

## 2018-02-27 HISTORY — PX: LEFT HEART CATH AND CORONARY ANGIOGRAPHY: CATH118249

## 2018-02-27 LAB — CBC
HEMATOCRIT: 43 % (ref 40.0–52.0)
Hemoglobin: 14.5 g/dL (ref 13.0–18.0)
MCH: 31.1 pg (ref 26.0–34.0)
MCHC: 33.7 g/dL (ref 32.0–36.0)
MCV: 92.4 fL (ref 80.0–100.0)
PLATELETS: 185 10*3/uL (ref 150–440)
RBC: 4.65 MIL/uL (ref 4.40–5.90)
RDW: 14.1 % (ref 11.5–14.5)
WBC: 5.4 10*3/uL (ref 3.8–10.6)

## 2018-02-27 LAB — BASIC METABOLIC PANEL
Anion gap: 8 (ref 5–15)
BUN: 12 mg/dL (ref 8–23)
CHLORIDE: 104 mmol/L (ref 98–111)
CO2: 27 mmol/L (ref 22–32)
Calcium: 8.5 mg/dL — ABNORMAL LOW (ref 8.9–10.3)
Creatinine, Ser: 0.9 mg/dL (ref 0.61–1.24)
GFR calc Af Amer: >60 mL/min (ref >60)
GFR calc non Af Amer: >60 mL/min (ref >60)
GLUCOSE: 100 mg/dL — AB (ref 70–99)
Potassium: 4 mmol/L (ref 3.5–5.1)
SODIUM: 139 mmol/L (ref 135–145)

## 2018-02-27 LAB — COMPREHENSIVE METABOLIC PANEL
ALT: 28 U/L (ref 0–44)
AST: 18 U/L (ref 15–41)
Albumin: 3.4 g/dL — ABNORMAL LOW (ref 3.5–5.0)
Alkaline Phosphatase: 47 U/L (ref 38–126)
Anion gap: 4 — ABNORMAL LOW (ref 5–15)
BUN: 15 mg/dL (ref 8–23)
CHLORIDE: 105 mmol/L (ref 98–111)
CO2: 30 mmol/L (ref 22–32)
CREATININE: 0.9 mg/dL (ref 0.61–1.24)
Calcium: 8.5 mg/dL — ABNORMAL LOW (ref 8.9–10.3)
GFR calc Af Amer: >60 mL/min (ref >60)
Glucose, Bld: 124 mg/dL — ABNORMAL HIGH (ref 70–99)
Potassium: 4.3 mmol/L (ref 3.5–5.1)
Sodium: 139 mmol/L (ref 135–145)
TOTAL PROTEIN: 6 g/dL — AB (ref 6.5–8.1)
Total Bilirubin: 0.9 mg/dL (ref 0.3–1.2)

## 2018-02-27 LAB — MAGNESIUM: MAGNESIUM: 2.3 mg/dL (ref 1.7–2.4)

## 2018-02-27 LAB — GLUCOSE, CAPILLARY
GLUCOSE-CAPILLARY: 81 mg/dL (ref 70–99)
Glucose-Capillary: 100 mg/dL — ABNORMAL HIGH (ref 70–99)
Glucose-Capillary: 166 mg/dL — ABNORMAL HIGH (ref 70–99)

## 2018-02-27 LAB — PROTIME-INR
INR: 0.98
PROTHROMBIN TIME: 12.9 s (ref 11.4–15.2)

## 2018-02-27 LAB — PHOSPHORUS: Phosphorus: 4.2 mg/dL (ref 2.5–4.6)

## 2018-02-27 SURGERY — LEFT HEART CATH AND CORONARY ANGIOGRAPHY
Anesthesia: Moderate Sedation | Laterality: Right

## 2018-02-27 MED ORDER — SODIUM CHLORIDE 0.9% FLUSH: 3.0000 mL | Freq: Two times a day (BID) | INTRAVENOUS | Status: DC

## 2018-02-27 MED ORDER — ASPIRIN 81 MG PO CHEW: 81.0000 mg | CHEWABLE_TABLET | ORAL | Status: DC

## 2018-02-27 MED ORDER — FLUTICASONE-SALMETEROL 250-50 MCG/DOSE IN AEPB: 1.0000 | INHALATION_SPRAY | Freq: Two times a day (BID) | RESPIRATORY_TRACT | 0 refills | Status: DC

## 2018-02-27 MED ORDER — ATORVASTATIN CALCIUM 20 MG PO TABS: 20.0000 mg | ORAL_TABLET | Freq: Every day | ORAL | 0 refills | Status: DC

## 2018-02-27 MED ORDER — SODIUM CHLORIDE 0.9 % WEIGHT BASED INFUSION: 1.0000 mL/kg/h | INTRAVENOUS | Status: AC

## 2018-02-27 MED ORDER — TIOTROPIUM BROMIDE MONOHYDRATE 18 MCG IN CAPS: 18.0000 ug | ORAL_CAPSULE | Freq: Every day | RESPIRATORY_TRACT | 1 refills | Status: DC

## 2018-02-27 MED ORDER — FENTANYL CITRATE (PF) 100 MCG/2ML IJ SOLN
INTRAMUSCULAR | Status: DC | PRN
  Administered 2018-02-27: 25 ug via INTRAVENOUS

## 2018-02-27 MED ORDER — ACETAMINOPHEN 325 MG PO TABS: 650.0000 mg | ORAL_TABLET | ORAL | Status: DC | PRN

## 2018-02-27 MED ORDER — IPRATROPIUM-ALBUTEROL 0.5-2.5 (3) MG/3ML IN SOLN
RESPIRATORY_TRACT | Status: AC
  Administered 2018-02-27: 3 mL via RESPIRATORY_TRACT
  Filled 2018-02-27: qty 3

## 2018-02-27 MED ORDER — FENTANYL CITRATE (PF) 100 MCG/2ML IJ SOLN
INTRAMUSCULAR | Status: AC
  Filled 2018-02-27: qty 2

## 2018-02-27 MED ORDER — SODIUM CHLORIDE 0.9% FLUSH: 3.0000 mL | INTRAVENOUS | Status: DC | PRN

## 2018-02-27 MED ORDER — HYDRALAZINE HCL 20 MG/ML IJ SOLN
INTRAMUSCULAR | Status: DC | PRN
  Administered 2018-02-27: 10 mg via INTRAVENOUS

## 2018-02-27 MED ORDER — LISINOPRIL 20 MG PO TABS: 20.0000 mg | ORAL_TABLET | Freq: Every day | ORAL | 0 refills | Status: DC

## 2018-02-27 MED ORDER — MIDAZOLAM HCL 2 MG/2ML IJ SOLN
INTRAMUSCULAR | Status: AC
  Filled 2018-02-27: qty 2

## 2018-02-27 MED ORDER — ONDANSETRON HCL 4 MG/2ML IJ SOLN
4.0000 mg | Freq: Four times a day (QID) | INTRAMUSCULAR | Status: DC | PRN
Start: 2018-02-27 — End: 2018-02-28

## 2018-02-27 MED ORDER — LEVOFLOXACIN 500 MG PO TABS: 500.0000 mg | ORAL_TABLET | Freq: Every day | ORAL | 0 refills | Status: DC

## 2018-02-27 MED ORDER — ASPIRIN 81 MG PO TBEC: 81.0000 mg | DELAYED_RELEASE_TABLET | Freq: Every day | ORAL | 0 refills | Status: DC

## 2018-02-27 MED ORDER — HYDROCHLOROTHIAZIDE 25 MG PO TABS: 25.0000 mg | ORAL_TABLET | Freq: Every day | ORAL | 0 refills | Status: DC

## 2018-02-27 MED ORDER — MIDAZOLAM HCL 2 MG/2ML IJ SOLN
INTRAMUSCULAR | Status: DC | PRN
  Administered 2018-02-27: 1 mg via INTRAVENOUS

## 2018-02-27 MED ORDER — SODIUM CHLORIDE 0.9 % WEIGHT BASED INFUSION: 1.0000 mL/kg/h | INTRAVENOUS | Status: DC

## 2018-02-27 MED ORDER — MOMETASONE FURO-FORMOTEROL FUM 100-5 MCG/ACT IN AERO: 2.0000 | INHALATION_SPRAY | Freq: Two times a day (BID) | RESPIRATORY_TRACT | 1 refills | Status: DC

## 2018-02-27 MED ORDER — LIDOCAINE HCL (PF) 1 % IJ SOLN
INTRAMUSCULAR | Status: AC
  Filled 2018-02-27: qty 30

## 2018-02-27 MED ORDER — ISOSORBIDE MONONITRATE ER 30 MG PO TB24: 30.0000 mg | ORAL_TABLET | Freq: Every day | ORAL | 0 refills | Status: DC

## 2018-02-27 MED ORDER — HYDRALAZINE HCL 20 MG/ML IJ SOLN
INTRAMUSCULAR | Status: AC
  Filled 2018-02-27: qty 1

## 2018-02-27 MED ORDER — SODIUM CHLORIDE 0.9 % IV SOLN: 250.0000 mL | INTRAVENOUS | Status: DC | PRN

## 2018-02-27 MED ORDER — IOPAMIDOL (ISOVUE-300) INJECTION 61%
INTRAVENOUS | Status: DC | PRN
  Administered 2018-02-27: 115 mL via INTRA_ARTERIAL

## 2018-02-27 MED ORDER — SODIUM CHLORIDE 0.9 % WEIGHT BASED INFUSION
3.0000 mL/kg/h | INTRAVENOUS | Status: DC
  Administered 2018-02-27: 3 mL/kg/h via INTRAVENOUS

## 2018-02-27 MED ORDER — METOPROLOL TARTRATE 25 MG PO TABS: 25.0000 mg | ORAL_TABLET | Freq: Two times a day (BID) | ORAL | 0 refills | Status: DC

## 2018-02-27 SURGICAL SUPPLY — 9 items
CATH INFINITI 5FR ANG PIGTAIL (CATHETERS) ×3 IMPLANT
CATH INFINITI 5FR JL4 (CATHETERS) ×3 IMPLANT
CATH INFINITI JR4 5F (CATHETERS) ×3 IMPLANT
DEVICE CLOSURE MYNXGRIP 5F (Vascular Products) ×3 IMPLANT
KIT MANI 3VAL PERCEP (MISCELLANEOUS) ×3 IMPLANT
NEEDLE PERC 18GX7CM (NEEDLE) ×3 IMPLANT
PACK CARDIAC CATH (CUSTOM PROCEDURE TRAY) ×3 IMPLANT
SHEATH AVANTI 5FR X 11CM (SHEATH) ×3 IMPLANT
WIRE GUIDERIGHT .035X150 (WIRE) ×3 IMPLANT

## 2018-02-27 NOTE — Clinical Social Work Note (Signed)
Clinical Social Work Assessment  Patient Details  Name: Cody Alexander MRN: [id number] Date of Birth: [birth year]  Date of referral:  02/27/18               Reason for consult:  Transportation, Housing Concerns/Homelessness                Permission sought to share information with:    Permission granted to share information::     Name::        Agency::     Relationship::     Contact Information:     Housing/Transportation Living arrangements for the past 2 months:  Homeless, [place] of Information:  Patient Patient Interpreter Needed:  None Criminal Activity/Legal Involvement Pertinent to Current Situation/Hospitalization:  Yes Significant Relationships:  None Lives with:  Other (Comment) Do you feel safe going back to the place where you live?  Yes Need for family participation in patient care:  No (Coment)  Care giving concerns: Patient is homeless and was staying at the [facility] prior to be arrested and being brought to [facility].   Social Worker assessment / plan:  CSW met with patient this afternoon and he stated that he did not have a home nor transportation. Patient stated that he was arrested for larceny and that he was just released from jail. After further speaking with patient he stated that he could go to a friend's house off [address]. CSW contacted [employer] and inquired how much it would be for a voucher to the address he wanted: [address] [place], [place], [place] and it would be $52.00. CSW has provided a voucher.   Employment status:  Disabled (Comment on whether or not currently receiving Disability) Insurance information:  Self Pay (Medicaid Pending) PT Recommendations:    Information / Referral to community resources:     Patient/Family's Response to care:  Patient was very appreciative of CSW assistance.  Patient/Family's Understanding of and Emotional Response to Diagnosis, Current Treatment, and Prognosis:   Patient was somewhat upset because his girlfriend had his car impounded while he was in the hospital but he was appreciative of CSW assistance with transportation.  Emotional Assessment Appearance:  Appears stated age Attitude/Demeanor/Rapport:  (pleasant) Affect (typically observed):  Calm Orientation:  Oriented to Self, Oriented to Place, Oriented to  Time, Oriented to Situation Alcohol / Substance use:  Not Applicable Psych involvement (Current and /or in the community):  No (Comment)  Discharge Needs  Concerns to be addressed:  Care Coordination Readmission within the last 30 days:  No Current discharge risk:  None Barriers to Discharge:  No Barriers Identified   Shela Leff, LCSW 02/27/2018, 4:47 PM

## 2018-02-27 NOTE — Progress Notes (Signed)
Patient released from custody, officers brought paperwork and personal belongings to patient in room ICU14

## 2018-02-27 NOTE — Discharge Summary (Signed)
Cody Alexander, is a 62 y.o. male  DOB [birth year]  MRN [id number].  Admission date:  02/25/2018  Admitting Physician  Katha Hamming, MD  Discharge Date:  02/27/2018   Primary MD  System, Pcp Not In  Recommendations for primary care physician for things to follow:   Follow-up with Dr. Adrian Blackwater in 1 week    Admission Diagnosis  NSTEMI (non-ST elevated myocardial infarction) [facility]) [I21.4]   Discharge Diagnosis  NSTEMI (non-ST elevated myocardial infarction) (HCC) [I21.4]    Active Problems:   Non-ST elevation MI (NSTEMI) [facility])      Past Medical History:  Diagnosis Date  . CHF (congestive heart failure) (HCC)   . COPD (chronic obstructive pulmonary disease) (HCC)   . Diabetes mellitus without complication (HCC)   . Hypertension     Past Surgical History:  Procedure Laterality Date  . LEFT HEART CATH AND CORONARY ANGIOGRAPHY Right 02/27/2018   Procedure: LEFT HEART CATH AND CORONARY ANGIOGRAPHY;  Surgeon: Laurier Nancy, MD;  Location: [facility] INVASIVE CV LAB;  Service: Cardiovascular;  Laterality: Right;       History of present illness and  Hospital Course:     Kindly see H&P for history of present illness and admission details, please review complete Labs, Consult reports and Test reports for all details in brief  HPI  from the history and physical done on the day of admission 62 year old obese male brought from prison because of chest pain and midsternal area.  Patient did not have any shortness of breath but has nausea, because of his chest heaviness admitted to telemetry.  Patient continues to have worse chest pain so moved him to ICU for nitro drip.   Hospital Course  : Chest heaviness ; initially thought to have non-ST elevation MI: Seen by cardiac cardiology Dr. Adrian Blackwater received full dose  aspirin, Plavix, full dose Lovenox, beta-blockers, Nitropaste.  Trend EKG did not show acute changes.  Patient troponins because of persistent chest heaviness patient is taken to cardiac cath by Dr. Adrian Blackwater today which showed Mild LAD 35% lesion with normal LV function, normal left circumflex, RCA.  Cardiology recommended aspirin 81 mg p.o. daily, Lipitor 20 mg p.o. daily, isosorbide 30 mg p.o. daily.  Will be discharged today, he is from prison but nurses tell me that they released him from present.  We need to make sure before we discharge the patient.  2.  COPD, acute bronchitis: Patient was a smoker before no quit.  Started on Levaquin, Dulera, Pulmicort nebulizer.  Less cough, discharging with Levaquin, Dulera,Spiriva. 3.l.htn;controlled.  Continue lisinopril, metoprolol,  Imdur.  Discontinue clonidine because we have added metoprolol, Imdur.  Discharge Condition: stable   Follow UP      Discharge Instructions  and  Discharge Medications      Allergies as of 02/27/2018      Reactions   Erythromycin Anaphylaxis, Hives, Swelling      Medication List    STOP taking these medications   cloNIDine 0.1 MG tablet Commonly known as:  CATAPRES     TAKE these medications   aspirin 81 MG EC tablet Take 1 tablet (81 mg total) by mouth daily. What changed:    medication strength  how much to take  when to take this   atorvastatin 20 MG tablet Commonly known as:  LIPITOR Take 1 tablet (20 mg total) by mouth daily.   hydrochlorothiazide 25 MG tablet Commonly known as:  HYDRODIURIL Take 1 tablet (25 mg total)  by mouth daily.   isosorbide mononitrate 30 MG 24 hr tablet Commonly known as:  IMDUR Take 1 tablet (30 mg total) by mouth daily.   levofloxacin 500 MG tablet Commonly known as:  LEVAQUIN Take 1 tablet (500 mg total) by mouth daily.   lisinopril 20 MG tablet Commonly known as:  PRINIVIL,ZESTRIL Take 1 tablet (20 mg total) by mouth daily.   metoprolol tartrate  25 MG tablet Commonly known as:  LOPRESSOR Take 1 tablet (25 mg total) by mouth 2 (two) times daily.   mometasone-formoterol 100-5 MCG/ACT Aero Commonly known as:  DULERA Inhale 2 puffs into the lungs 2 (two) times daily.   tiotropium 18 MCG inhalation capsule Commonly known as:  SPIRIVA Place 1 capsule (18 mcg total) into inhaler and inhale daily.         Diet and Activity recommendation: See Discharge Instructions above   Consults obtained - cardiology   Major procedures and Radiology Reports - PLEASE review detailed and final reports for all details, in brief -      Dg Chest 2 View  Result Date: 02/25/2018 CLINICAL DATA:  62 year old male who was awakened by sharp, heavy central chest pain. Left arm numbness and dizziness. EXAM: CHEST - 2 VIEW COMPARISON:  Chest radiographs 09/25/2016 and earlier. FINDINGS: Seated AP and lateral views of the chest. Lung volumes and mediastinal contours remain normal. Lung parenchyma appears stable and clear. Visualized tracheal air column is within normal limits. No pneumothorax or pleural effusion. No acute osseous abnormality identified. Negative visible bowel gas pattern. IMPRESSION: Negative.  No acute cardiopulmonary abnormality. Electronically Signed   By: Odessa FlemingH  Hall M.D.   On: 02/25/2018 15:20    Micro Results     Recent Results (from the past 240 hour(s))  MRSA PCR Screening     Status: None   Collection Time: 02/25/18  7:47 PM  Result Value Ref Range Status   MRSA by PCR NEGATIVE NEGATIVE Final    Comment:        The GeneXpert MRSA Assay (FDA approved for NASAL specimens only), is one component of a comprehensive MRSA colonization surveillance program. It is not intended to diagnose MRSA infection nor to guide or monitor treatment for MRSA infections. Performed at [facility], [address] [place], [place] [address]        Today   Subjective:   Cody Alexander today has no headache,no chest  abdominal pain,no new weakness tingling or numbness, feels much better wants to go home today.   Objective:   Blood pressure (!) 140/93, pulse 63, temperature 97.9 F (36.6 C), temperature source Oral, resp. rate (!) 9, height 6\' 1"  (1.854 m), weight 127 kg (280 lb), SpO2 97 %.   Intake/Output Summary (Last 24 hours) at 02/27/2018 1302 Last data filed at 02/27/2018 1200 Gross per 24 hour  Intake 2654.71 ml  Output 1850 ml  Net 804.71 ml    Exam Awake Alert, Oriented x 3, No new F.N deficits, Normal affect [place].AT,PERRAL Supple Neck,No JVD, No cervical lymphadenopathy appriciated.  Symmetrical Chest wall movement, Good air movement bilaterally, CTAB RRR,No Gallops,Rubs or new Murmurs, No Parasternal Heave +ve B.Sounds, Abd Soft, Non tender, No organomegaly appriciated, No rebound -guarding or rigidity. No Cyanosis, Clubbing or edema, No new Rash or bruise  Data Review   CBC w Diff:  Lab Results  Component Value Date   WBC 5.4 02/27/2018   HGB 14.5 02/27/2018   HGB 15.5 08/20/2013   HCT 43.0 02/27/2018  HCT 45.7 08/20/2013   PLT 185 02/27/2018   PLT 277 08/20/2013   LYMPHOPCT 18.2 08/20/2013   MONOPCT 16.3 08/20/2013   EOSPCT 0.2 08/20/2013   BASOPCT 0.7 08/20/2013    CMP:  Lab Results  Component Value Date   NA 139 02/27/2018   NA 134 (L) 08/20/2013   K 4.0 02/27/2018   K 3.7 08/20/2013   CL 104 02/27/2018   CL 99 08/20/2013   CO2 27 02/27/2018   CO2 34 (H) 08/20/2013   BUN 12 02/27/2018   BUN 11 08/20/2013   CREATININE 0.90 02/27/2018   CREATININE 1.22 08/20/2013   PROT 6.0 (L) 02/27/2018   PROT 7.8 08/20/2013   ALBUMIN 3.4 (L) 02/27/2018   ALBUMIN 3.7 08/20/2013   BILITOT 0.9 02/27/2018   BILITOT 0.8 08/20/2013   ALKPHOS 47 02/27/2018   ALKPHOS 64 08/20/2013   AST 18 02/27/2018   AST 29 08/20/2013   ALT 28 02/27/2018   ALT 34 08/20/2013  .   Total Time in preparing paper work, data evaluation and todays exam - 35 minutes  Katha Hamming M.D  on 02/27/2018 at 1:02 PM    Note: This dictation was prepared with Dragon dictation along with smaller phrase technology. Any transcriptional errors that result from this process are unintentional.

## 2018-02-27 NOTE — Progress Notes (Signed)
SUBJECTIVE: Patient continues to have chest pain.   Vitals:   02/27/18 0300 02/27/18 0400 02/27/18 0404 02/27/18 0700  BP: (!) 134/96 (!) 155/88  (!) 149/97  Pulse: (!) 50 (!) 52  (!) 51  Resp: 15 (!) 21  13  Temp:  97.9 F (36.6 C)  97.6 F (36.4 C)  TempSrc:  Oral  Oral  SpO2: 95% 100%  98%  Weight:   236 lb 1.8 oz (107.1 kg)   Height:        Intake/Output Summary (Last 24 hours) at 02/27/2018 0935 Last data filed at 02/27/2018 0730 Gross per 24 hour  Intake 1161.43 ml  Output 1650 ml  Net -488.57 ml    LABS: Basic Metabolic Panel: Recent Labs    02/26/18 0506 02/27/18 0410  NA 141 139  K 3.3* 4.3  CL 104 105  CO2 28 30  GLUCOSE 111* 124*  BUN 19 15  CREATININE 1.20 0.90  CALCIUM 8.6* 8.5*  MG  --  2.3  PHOS  --  4.2   Liver Function Tests: Recent Labs    02/27/18 0410  AST 18  ALT 28  ALKPHOS 47  BILITOT 0.9  PROT 6.0*  ALBUMIN 3.4*   No results for input(s): LIPASE, AMYLASE in the last 72 hours. CBC: Recent Labs    02/25/18 1457 02/26/18 0506  WBC 7.5 5.6  HGB 16.0 14.2  HCT 47.1 41.0  MCV 92.2 91.9  PLT 260 192   Cardiac Enzymes: Recent Labs    02/25/18 1808 02/26/18 0126 02/26/18 0506  TROPONINI 0.11* 0.09* 0.09*   BNP: Invalid input(s): POCBNP D-Dimer: No results for input(s): DDIMER in the last 72 hours. Hemoglobin A1C: Recent Labs    02/25/18 1808  HGBA1C 8.3*   Fasting Lipid Panel: Recent Labs    02/26/18 0506  CHOL 131  HDL 30*  LDLCALC 79  TRIG [id number]  CHOLHDL 4.4   Thyroid Function Tests: No results for input(s): TSH, T4TOTAL, T3FREE, THYROIDAB in the last 72 hours.  Invalid input(s): FREET3 Anemia Panel: No results for input(s): VITAMINB12, FOLATE, FERRITIN, TIBC, IRON, RETICCTPCT in the last 72 hours.   PHYSICAL EXAM General: Well developed, well nourished, in no acute distress HEENT:  Normocephalic and atramatic Neck:  No JVD.  Lungs: Clear bilaterally to auscultation and percussion. Heart: HRRR .  Normal S1 and S2 without gallops or murmurs.  Abdomen: Bowel sounds are positive, abdomen soft and non-tender  Msk:  Back normal, normal gait. Normal strength and tone for age. Extremities: No clubbing, cyanosis or edema.   Neuro: Alert and oriented X 3. Psych:  Good affect, responds appropriately  TELEMETRY: Sinus rhythm  ASSESSMENT AND PLAN: Non-STEMI with acute coronary syndrome. Patient to have cardiac catheterization today.  Active Problems:   Non-ST elevation MI (NSTEMI) (HCC)    Tramain Gershman A, MD, [facility] 02/27/2018 9:35 AM    2

## 2018-02-27 NOTE — Progress Notes (Signed)
Patient seen in ICU, scheduled for cardiac cath if the cardiac cath is normal likely discharge back to prison later today.

## 2018-02-27 NOTE — Progress Notes (Signed)
Patient to be D/C'd Home per MD. Discussed with the patient and all questions fully answered.   VSS, skin clean, dry and intact without evidence of skin break down, no evidence of skin tears noted.  IV catheters discontinued intact. Site without signs and symptoms of complications. Dressing and pressure applied.   An after visit summary was printed and given to the patient. Patient received prescriptions.   D/c education completed with patient/family including follow up instructions, medications list, d/c activities limitations if indicated, with other d/c instructions as indicated by MD- patient able to verbalize understanding, all questions fully answered.   Patient instructed to return to ED, call 911, or Call MD for any changes in condition.  Patient waiting for taxi service to arrive for transportation.

## 2018-02-27 NOTE — Progress Notes (Signed)
Patient enrolled in Spiriva hospital to home. Form faxed and received.   MLS  07.05.2019 1614

## 2018-02-27 NOTE — Progress Notes (Signed)
Patient has mild mid LAD 35% lesion with normal left reticular systolic function and normal left circumflex and RCA. Advise aspirin 81 mg by mouth and Lipitor 20 mg once a day and isosorbide 30 mg once a day. Patient can be discharged with follow-up in one week in the office.

## 2018-02-27 NOTE — Care Management (Signed)
RNCM consulted regarding homelessness and issues obtaining medications. Patient recently was removed from police custody and prior to that was a resident of the homeless shelter in [place]. Currently has a destination in place for discharge. Will provide medication assistance via coupons and delivery of spiriva. All other medications are available on the [facility] $4 list. Patient appreciative of the assistance and will be able to obtain these medications. Will obtain transport [facility] [employer].  No further RNCM needs. Will sign off.   Buddy DutyJosh Kahari Critzer RN BSN RNCM [phone]

## 2020-06-20 ENCOUNTER — Emergency Department: Payer: Medicaid Other

## 2020-06-20 ENCOUNTER — Inpatient Hospital Stay
Admission: EM | Admit: 2020-06-20 | Discharge: 2020-06-24 | DRG: 190 | Disposition: A | Discharge status: Home / Self Care | Payer: Medicaid Other | Attending: Hospitalist | Admitting: Hospitalist

## 2020-06-20 ENCOUNTER — Other Ambulatory Visit: Payer: Self-pay

## 2020-06-20 DIAGNOSIS — Z9981 Dependence on supplemental oxygen: Secondary | ICD-10-CM | POA: Diagnosis not present

## 2020-06-20 DIAGNOSIS — I252 Old myocardial infarction: Secondary | ICD-10-CM | POA: Diagnosis not present

## 2020-06-20 DIAGNOSIS — J9622 Acute and chronic respiratory failure with hypercapnia: Secondary | ICD-10-CM | POA: Diagnosis present

## 2020-06-20 DIAGNOSIS — J441 Chronic obstructive pulmonary disease with (acute) exacerbation: Principal | ICD-10-CM | POA: Diagnosis present

## 2020-06-20 DIAGNOSIS — J449 Chronic obstructive pulmonary disease, unspecified: Secondary | ICD-10-CM | POA: Diagnosis present

## 2020-06-20 DIAGNOSIS — I5042 Chronic combined systolic (congestive) and diastolic (congestive) heart failure: Secondary | ICD-10-CM | POA: Diagnosis present

## 2020-06-20 DIAGNOSIS — I10 Essential (primary) hypertension: Secondary | ICD-10-CM | POA: Diagnosis present

## 2020-06-20 DIAGNOSIS — Z881 Allergy status to other antibiotic agents status: Secondary | ICD-10-CM

## 2020-06-20 DIAGNOSIS — I110 Hypertensive heart disease with heart failure: Secondary | ICD-10-CM | POA: Diagnosis present

## 2020-06-20 DIAGNOSIS — Z79899 Other long term (current) drug therapy: Secondary | ICD-10-CM

## 2020-06-20 DIAGNOSIS — J9811 Atelectasis: Secondary | ICD-10-CM | POA: Diagnosis present

## 2020-06-20 DIAGNOSIS — Z794 Long term (current) use of insulin: Secondary | ICD-10-CM | POA: Diagnosis present

## 2020-06-20 DIAGNOSIS — Z7984 Long term (current) use of oral hypoglycemic drugs: Secondary | ICD-10-CM

## 2020-06-20 DIAGNOSIS — J9621 Acute and chronic respiratory failure with hypoxia: Secondary | ICD-10-CM | POA: Diagnosis present

## 2020-06-20 DIAGNOSIS — Z20822 Contact with and (suspected) exposure to covid-19: Secondary | ICD-10-CM | POA: Diagnosis present

## 2020-06-20 DIAGNOSIS — E66813 Obesity, class 3: Secondary | ICD-10-CM | POA: Diagnosis present

## 2020-06-20 DIAGNOSIS — Z7982 Long term (current) use of aspirin: Secondary | ICD-10-CM | POA: Diagnosis not present

## 2020-06-20 DIAGNOSIS — E1142 Type 2 diabetes mellitus with diabetic polyneuropathy: Secondary | ICD-10-CM | POA: Diagnosis present

## 2020-06-20 DIAGNOSIS — Z713 Dietary counseling and surveillance: Secondary | ICD-10-CM

## 2020-06-20 DIAGNOSIS — E1165 Type 2 diabetes mellitus with hyperglycemia: Secondary | ICD-10-CM | POA: Diagnosis present

## 2020-06-20 LAB — COMPREHENSIVE METABOLIC PANEL
ALT: 13 U/L (ref 0–44)
AST: 16 U/L (ref 15–41)
Albumin: 4.7 g/dL (ref 3.5–5.0)
Alkaline Phosphatase: 58 U/L (ref 38–126)
Anion gap: 10 (ref 5–15)
BUN: 17 mg/dL (ref 8–23)
CO2: 28 mmol/L (ref 22–32)
Calcium: 9.2 mg/dL (ref 8.9–10.3)
Chloride: 99 mmol/L (ref 98–111)
Creatinine, Ser: 0.9 mg/dL (ref 0.61–1.24)
GFR, Estimated: >60 mL/min (ref >60)
Glucose, Bld: 94 mg/dL (ref 70–99)
Potassium: 3.9 mmol/L (ref 3.5–5.1)
Sodium: 137 mmol/L (ref 135–145)
Total Bilirubin: 1.5 mg/dL — ABNORMAL HIGH (ref 0.3–1.2)
Total Protein: 7.9 g/dL (ref 6.5–8.1)

## 2020-06-20 LAB — TROPONIN I (HIGH SENSITIVITY)
Troponin I (High Sensitivity): 89 ng/L — ABNORMAL HIGH (ref <18)
Troponin I (High Sensitivity): 91 ng/L — ABNORMAL HIGH (ref <18)

## 2020-06-20 LAB — CBC WITH DIFFERENTIAL/PLATELET
Abs Immature Granulocytes: 0.02 10*3/uL (ref 0.00–0.07)
Basophils Absolute: 0 10*3/uL (ref 0.0–0.1)
Basophils Relative: 1 %
Eosinophils Absolute: 0.1 10*3/uL (ref 0.0–0.5)
Eosinophils Relative: 1 %
HCT: 43.9 % (ref 39.0–52.0)
Hemoglobin: 14.8 g/dL (ref 13.0–17.0)
Immature Granulocytes: 0 %
Lymphocytes Relative: 8 %
Lymphs Abs: 0.6 10*3/uL — ABNORMAL LOW (ref 0.7–4.0)
MCH: 31 pg (ref 26.0–34.0)
MCHC: 33.7 g/dL (ref 30.0–36.0)
MCV: 91.8 fL (ref 80.0–100.0)
Monocytes Absolute: 0.6 10*3/uL (ref 0.1–1.0)
Monocytes Relative: 9 %
Neutro Abs: 5.6 10*3/uL (ref 1.7–7.7)
Neutrophils Relative %: 81 %
Platelets: 230 10*3/uL (ref 150–400)
RBC: 4.78 MIL/uL (ref 4.22–5.81)
RDW: 13.2 % (ref 11.5–15.5)
WBC: 6.9 10*3/uL (ref 4.0–10.5)
nRBC: 0 % (ref 0.0–0.2)

## 2020-06-20 LAB — BRAIN NATRIURETIC PEPTIDE: B Natriuretic Peptide: 136 pg/mL — ABNORMAL HIGH (ref 0.0–100.0)

## 2020-06-20 LAB — GLUCOSE, CAPILLARY: Glucose-Capillary: 197 mg/dL — ABNORMAL HIGH (ref 70–99)

## 2020-06-20 LAB — RESPIRATORY PANEL BY RT PCR (FLU A&B, COVID)
Influenza A by PCR: NEGATIVE
Influenza B by PCR: NEGATIVE
SARS Coronavirus 2 by RT PCR: NEGATIVE

## 2020-06-20 LAB — LACTIC ACID, PLASMA
Lactic Acid, Venous: 1.4 mmol/L (ref 0.5–1.9)
Lactic Acid, Venous: 1.4 mmol/L (ref 0.5–1.9)

## 2020-06-20 MED ORDER — IPRATROPIUM-ALBUTEROL 0.5-2.5 (3) MG/3ML IN SOLN
3.0000 mL | Freq: Once | RESPIRATORY_TRACT | Status: AC
  Administered 2020-06-20: 3 mL via RESPIRATORY_TRACT
  Filled 2020-06-20: qty 3

## 2020-06-20 MED ORDER — ALBUTEROL SULFATE (2.5 MG/3ML) 0.083% IN NEBU
2.5000 mg | INHALATION_SOLUTION | RESPIRATORY_TRACT | Status: DC | PRN
  Administered 2020-06-21 – 2020-06-23 (×3): 2.5 mg via RESPIRATORY_TRACT
  Filled 2020-06-20 (×4): qty 3

## 2020-06-20 MED ORDER — ALBUTEROL SULFATE HFA 108 (90 BASE) MCG/ACT IN AERS
2.0000 | INHALATION_SPRAY | Freq: Once | RESPIRATORY_TRACT | Status: AC
  Administered 2020-06-20: 2 via RESPIRATORY_TRACT
  Filled 2020-06-20 (×2): qty 6.7

## 2020-06-20 MED ORDER — METHYLPREDNISOLONE SODIUM SUCC 125 MG IJ SOLR
125.0000 mg | Freq: Four times a day (QID) | INTRAMUSCULAR | Status: AC
  Administered 2020-06-20 – 2020-06-21 (×4): 125 mg via INTRAVENOUS
  Filled 2020-06-20 (×4): qty 2

## 2020-06-20 MED ORDER — IPRATROPIUM-ALBUTEROL 0.5-2.5 (3) MG/3ML IN SOLN
3.0000 mL | Freq: Four times a day (QID) | RESPIRATORY_TRACT | Status: DC
  Administered 2020-06-20 – 2020-06-21 (×3): 3 mL via RESPIRATORY_TRACT
  Filled 2020-06-20 (×3): qty 3

## 2020-06-20 MED ORDER — ALBUTEROL SULFATE (2.5 MG/3ML) 0.083% IN NEBU
5.0000 mg | INHALATION_SOLUTION | Freq: Once | RESPIRATORY_TRACT | Status: AC
  Administered 2020-06-20: 5 mg via RESPIRATORY_TRACT
  Filled 2020-06-20: qty 6

## 2020-06-20 MED ORDER — SODIUM CHLORIDE 0.9 % IV SOLN
2.0000 g | Freq: Three times a day (TID) | INTRAVENOUS | Status: DC
  Administered 2020-06-20 – 2020-06-21 (×2): 2 g via INTRAVENOUS
  Filled 2020-06-20 (×3): qty 2

## 2020-06-20 MED ORDER — METHYLPREDNISOLONE SODIUM SUCC 125 MG IJ SOLR
125.0000 mg | Freq: Once | INTRAMUSCULAR | Status: AC
  Administered 2020-06-20: 125 mg via INTRAVENOUS
  Filled 2020-06-20: qty 2

## 2020-06-20 MED ORDER — INSULIN ASPART 100 UNIT/ML ~~LOC~~ SOLN
0.0000 [IU] | Freq: Every day | SUBCUTANEOUS | Status: DC
  Administered 2020-06-21: 3 [IU] via SUBCUTANEOUS
  Filled 2020-06-20: qty 1

## 2020-06-20 MED ORDER — PREDNISONE 20 MG PO TABS
40.0000 mg | ORAL_TABLET | Freq: Every day | ORAL | Status: DC
  Administered 2020-06-22 – 2020-06-24 (×3): 40 mg via ORAL
  Filled 2020-06-20 (×3): qty 2

## 2020-06-20 MED ORDER — ENOXAPARIN SODIUM 60 MG/0.6ML ~~LOC~~ SOLN
0.5000 mg/kg | SUBCUTANEOUS | Status: DC
  Administered 2020-06-20 – 2020-06-23 (×4): 57.5 mg via SUBCUTANEOUS
  Filled 2020-06-20 (×5): qty 0.6

## 2020-06-20 MED ORDER — INSULIN ASPART 100 UNIT/ML ~~LOC~~ SOLN
0.0000 [IU] | Freq: Three times a day (TID) | SUBCUTANEOUS | Status: DC
  Administered 2020-06-21: 4 [IU] via SUBCUTANEOUS
  Administered 2020-06-21 (×2): 7 [IU] via SUBCUTANEOUS
  Administered 2020-06-22 (×2): 3 [IU] via SUBCUTANEOUS
  Administered 2020-06-23 – 2020-06-24 (×2): 4 [IU] via SUBCUTANEOUS
  Filled 2020-06-20 (×7): qty 1

## 2020-06-20 NOTE — ED Triage Notes (Addendum)
Pt arrives to ed via ems from home. C/o cough, sob, body ache, sore throat starting Saturday. Pt wears o2 at home on 2L [place], pt currently using same. Wheezing noted in triage, with labored breathing at this time. Reports having CP last night, but has subsided at this time. Able to speak in sentences. Hx of CHF, copd.

## 2020-06-20 NOTE — Progress Notes (Signed)
PHARMACIST - PHYSICIAN COMMUNICATION  CONCERNING:  Enoxaparin (Lovenox) for DVT Prophylaxis    RECOMMENDATION: Patient was prescribed enoxaparin 40mg  q24 hours for VTE prophylaxis.   Filed Weights   06/20/20 1449  Weight: 114.3 kg (252 lb)    Body mass index is 33.25 kg/m.  Estimated Creatinine Clearance: 111.3 mL/min (by C-G formula based on SCr of 0.9 mg/dL).   Based on [facility] policy patient is candidate for enoxaparin 0.5mg /kg TBW SQ every 24 hours based on BMI being >30.  DESCRIPTION: Pharmacy has adjusted enoxaparin dose per [facility] policy.  Patient is now receiving enoxaparin 57.5 mg every 24 hours   [facility] 06/20/2020 8:18 PM

## 2020-06-20 NOTE — H&P (Signed)
History and Physical   Cody Alexander NKN:[id number] DOB: 1955-10-12 DOA: 06/20/2020  Referring MD/NP/PA: Dr. Roxan Hockey  PCP: Mckinley Jewel, FNP   Outpatient Specialists: None  Patient coming from: Home  Chief Complaint: Shortness of breath  HPI: Cody Alexander is a 64 y.o. male with medical history significant of COPD, systolic dysfunction CHF, diabetes, hypertension, morbid obesity who presented to the ER with progressive shortness of breath cough congestion and fevers.  Patient has had recurrent hospitalizations due to COPD exacerbation.  He came in with oxygen sats in the 80s.  Also having significant bilateral wheezing.  He denied any sick contact.  COVID-19 screen is so far negative.  He is having generalized malaise with his symptoms.  Patient evaluated and found to have acute on chronic respiratory failure secondary to COPD exacerbation.  He is being admitted for further evaluation and treatment..  ED Course: Temperature 98.2 blood pressure 152/101 pulse 94 respirate 24 oxygen sat 92% room air.  CBC and chemistry all within normal.  COVID-19 is negative.  Troponin is 90 1X1.4.  Chest x-ray shows chronic interstitial changes and bibasilar atelectasis and scarring.  Patient be admitted for further evaluation and treatment of COPD exacerbation  Review of Systems: As per HPI otherwise 10 point review of systems negative.    Past Medical History:  Diagnosis Date  . CHF (congestive heart failure) (HCC)   . COPD (chronic obstructive pulmonary disease) (HCC)   . Diabetes mellitus without complication (HCC)   . Hypertension     Past Surgical History:  Procedure Laterality Date  . LEFT HEART CATH AND CORONARY ANGIOGRAPHY Right 02/27/2018   Procedure: LEFT HEART CATH AND CORONARY ANGIOGRAPHY;  Surgeon: Laurier Nancy, MD;  Location: [facility] INVASIVE CV LAB;  Service: Cardiovascular;  Laterality: Right;     reports that he has never smoked. He has never used smokeless tobacco. He  reports that he does not drink alcohol. No history on file for drug use.  Allergies  Allergen Reactions  . Erythromycin Anaphylaxis, Hives and Swelling    History reviewed. No pertinent family history.   Prior to Admission medications   Medication Sig Start Date End Date Taking? Authorizing Provider  aspirin EC 81 MG EC tablet Take 1 tablet (81 mg total) by mouth daily. 02/27/18   Katha Hamming, MD  atorvastatin (LIPITOR) 20 MG tablet Take 1 tablet (20 mg total) by mouth daily. 02/27/18 02/27/19  Katha Hamming, MD  Fluticasone-Salmeterol (WIXELA INHUB) 250-50 MCG/DOSE AEPB Inhale 1 puff into the lungs 2 (two) times daily. 02/27/18   Katha Hamming, MD  hydrochlorothiazide (HYDRODIURIL) 25 MG tablet Take 1 tablet (25 mg total) by mouth daily. 02/27/18   Katha Hamming, MD  isosorbide mononitrate (IMDUR) 30 MG 24 hr tablet Take 1 tablet (30 mg total) by mouth daily. 02/27/18 02/27/19  Katha Hamming, MD  lisinopril (PRINIVIL,ZESTRIL) 20 MG tablet Take 1 tablet (20 mg total) by mouth daily. 02/27/18   Katha Hamming, MD  metoprolol tartrate (LOPRESSOR) 25 MG tablet Take 1 tablet (25 mg total) by mouth 2 (two) times daily. 02/27/18   Katha Hamming, MD  tiotropium (SPIRIVA) 18 MCG inhalation capsule Place 1 capsule (18 mcg total) into inhaler and inhale daily. 02/27/18   Katha Hamming, MD    Physical Exam: Vitals:   06/20/20 1444 06/20/20 1449  BP: (!) 152/101   Pulse: 95   Temp: 98.2 F (36.8 C)   TempSrc: Oral   SpO2: 94%   Weight:  114.3 kg  Height:  6\' 1"  (1.854 m)      Constitutional: Acutely ill looking with respiratory distress Vitals:   06/20/20 1444 06/20/20 1449  BP: (!) 152/101   Pulse: 95   Temp: 98.2 F (36.8 C)   TempSrc: Oral   SpO2: 94%   Weight:  114.3 kg  Height:  6\' 1"  (1.854 m)   Eyes: PERRL, lids and conjunctivae normal ENMT: Mucous membranes are moist. Posterior pharynx clear of any exudate or lesions.Normal dentition.   Neck: normal, supple, no masses, no thyromegaly Respiratory: Decreased air entry bilaterally with marked expiratory wheezing and increased respiratory effort. No accessory muscle use.  Cardiovascular: Sinus tachycardia, no murmurs / rubs / gallops. No extremity edema. 2+ pedal pulses. No carotid bruits.  Abdomen: no tenderness, no masses palpated. No hepatosplenomegaly. Bowel sounds positive.  Musculoskeletal: no clubbing / cyanosis. No joint deformity upper and lower extremities. Good ROM, no contractures. Normal muscle tone.  Skin: no rashes, lesions, ulcers. No induration Neurologic: CN 2-12 grossly intact. Sensation intact, DTR normal. Strength 5/5 in all 4.  Psychiatric: Normal judgment and insight. Alert and oriented x 3. Normal mood.     Labs on Admission: I have personally reviewed following labs and imaging studies  CBC: Recent Labs  Lab 06/20/20 1505  WBC 6.9  NEUTROABS 5.6  HGB 14.8  HCT 43.9  MCV 91.8  PLT 230   Basic Metabolic Panel: Recent Labs  Lab 06/20/20 1505  NA 137  K 3.9  CL 99  CO2 28  GLUCOSE 94  BUN 17  CREATININE 0.90  CALCIUM 9.2   GFR: Estimated Creatinine Clearance: 111.3 mL/min (by C-G formula based on SCr of 0.9 mg/dL). Liver Function Tests: Recent Labs  Lab 06/20/20 1505  AST 16  ALT 13  ALKPHOS 58  BILITOT 1.5*  PROT 7.9  ALBUMIN 4.7   No results for input(s): LIPASE, AMYLASE in the last 168 hours. No results for input(s): AMMONIA in the last 168 hours. Coagulation Profile: No results for input(s): INR, PROTIME in the last 168 hours. Cardiac Enzymes: No results for input(s): CKTOTAL, CKMB, CKMBINDEX, TROPONINI in the last 168 hours. BNP (last 3 results) No results for input(s): PROBNP in the last 8760 hours. HbA1C: No results for input(s): HGBA1C in the last 72 hours. CBG: No results for input(s): GLUCAP in the last 168 hours. Lipid Profile: No results for input(s): CHOL, HDL, LDLCALC, TRIG, CHOLHDL, LDLDIRECT in the  last 72 hours. Thyroid Function Tests: No results for input(s): TSH, T4TOTAL, FREET4, T3FREE, THYROIDAB in the last 72 hours. Anemia Panel: No results for input(s): VITAMINB12, FOLATE, FERRITIN, TIBC, IRON, RETICCTPCT in the last 72 hours. Urine analysis:    Component Value Date/Time   COLORURINE Yellow 08/20/2013 0849   APPEARANCEUR Clear 08/20/2013 0849   LABSPEC 1.014 08/20/2013 0849   PHURINE 6.0 08/20/2013 0849   GLUCOSEU Negative 08/20/2013 0849   HGBUR Negative 08/20/2013 0849   BILIRUBINUR Negative 08/20/2013 0849   KETONESUR Negative 08/20/2013 0849   PROTEINUR Negative 08/20/2013 0849   NITRITE Negative 08/20/2013 0849   LEUKOCYTESUR Negative 08/20/2013 0849   Sepsis Labs: @LABRCNTIP (procalcitonin:4,lacticidven:4) ) Recent Results (from the past 240 hour(s))  Respiratory Panel by RT PCR (Flu A&B, Covid) - Nasopharyngeal Swab     Status: None   Collection Time: 06/20/20  5:13 PM   Specimen: Nasopharyngeal Swab  Result Value Ref Range Status   SARS Coronavirus 2 by RT PCR NEGATIVE NEGATIVE Final    Comment: (NOTE) SARS-CoV-2 target nucleic acids are NOT DETECTED.  The SARS-CoV-2 RNA is generally detectable in upper respiratoy specimens during the acute phase of infection. The lowest concentration of SARS-CoV-2 viral copies this assay can detect is 131 copies/mL. A negative result does not preclude SARS-Cov-2 infection and should not be used as the sole basis for treatment or other patient management decisions. A negative result may occur with  improper specimen collection/handling, submission of specimen other than nasopharyngeal swab, presence of viral mutation(s) within the areas targeted by this assay, and inadequate number of viral copies (<131 copies/mL). A negative result must be combined with clinical observations, patient history, and epidemiological information. The expected result is Negative.  Fact Sheet for Patients:   https://www.moore.com/  Fact Sheet for Healthcare Providers:  https://www.young.biz/  This test is no t yet approved or cleared by the [place] FDA and  has been authorized for detection and/or diagnosis of SARS-CoV-2 by FDA under an Emergency Use Authorization (EUA). This EUA will remain  in effect (meaning this test can be used) for the duration of the COVID-19 declaration under Section 564(b)(1) of the Act, 21 U.S.C. section 360bbb-3(b)(1), unless the authorization is terminated or revoked sooner.     Influenza A by PCR NEGATIVE NEGATIVE Final   Influenza B by PCR NEGATIVE NEGATIVE Final    Comment: (NOTE) The Xpert Xpress SARS-CoV-2/FLU/RSV assay is intended as an aid in  the diagnosis of influenza from Nasopharyngeal swab specimens and  should not be used as a sole basis for treatment. Nasal washings and  aspirates are unacceptable for Xpert Xpress SARS-CoV-2/FLU/RSV  testing.  Fact Sheet for Patients: https://www.moore.com/  Fact Sheet for Healthcare Providers: https://www.young.biz/  This test is not yet approved or cleared by the [place] FDA and  has been authorized for detection and/or diagnosis of SARS-CoV-2 by  FDA under an Emergency Use Authorization (EUA). This EUA will remain  in effect (meaning this test can be used) for the duration of the  Covid-19 declaration under Section 564(b)(1) of the Act, 21  U.S.C. section 360bbb-3(b)(1), unless the authorization is  terminated or revoked. Performed at [facility], [address] [place], [place] [address]      Radiological Exams on Admission: DG Chest 2 View  Result Date: 06/20/2020 CLINICAL DATA:  Shortness of breath EXAM: CHEST - 2 VIEW COMPARISON:  Radiograph 01/12/2019 FINDINGS: Mildly coarsened interstitial opacities are similar to prior. Few bandlike opacities in the periphery of the lung bases favor  atelectasis and or scarring. No new focal consolidative opacity or features of edema. No pneumothorax or visible effusion. The aorta is calcified. The remaining cardiomediastinal contours are unremarkable. No acute osseous or soft tissue abnormality. IMPRESSION: Chronic interstitial changes and bibasilar atelectasis and or scarring. No other acute cardiopulmonary disease. Electronically Signed   By: Kreg Shropshire M.D.   On: 06/20/2020 15:54    EKG: Independently reviewed.  Sinus rhythm with no significant ST changes.  Assessment/Plan Principal Problem:   Acute on chronic respiratory failure with hypoxia and hypercapnia (HCC) Active Problems:   COPD with acute exacerbation (HCC)   DM type 2 with diabetic peripheral neuropathy (HCC)   Essential hypertension   Obesity, Class III, BMI 40-49.9 (morbid obesity) (HCC)   Chronic combined systolic and diastolic heart failure (HCC)     #1 acute on chronic renal failure with hypoxia and hypercarbia: Patient will be admitted.  Initiate IVs Solu-Medrol, nebulizer, antibiotics and breathing treatments.  We will continue close monitoring.  #2 diabetes: Sliding scale insulin with home regimen.  #3  acute exacerbation of COPD: Continue as per #1.  #4 essential hypertension: Resume home regimen.  Continue management.  #5 combined systolic and diastolic heart failure: Appears compensated.  Continue treatment.  #6 morbid obesity: Dietary counseling.   DVT prophylaxis: Lovenox Code Status: Full code Family Communication: No family at bedside Disposition Plan: Home Consults called: None Admission status: Inpatient  Severity of Illness: The appropriate patient status for this patient is INPATIENT. Inpatient status is judged to be reasonable and necessary in order to provide the required intensity of service to ensure the patient's safety. The patient's presenting symptoms, physical exam findings, and initial radiographic and laboratory data in the  context of their chronic comorbidities is felt to place them at high risk for further clinical deterioration. Furthermore, it is not anticipated that the patient will be medically stable for discharge from the hospital within 2 midnights of admission. The following factors support the patient status of inpatient.   " The patient's presenting symptoms include shortness of breath. " The worrisome physical exam findings include mild expiratory wheezing. " The initial radiographic and laboratory data are worrisome because of no new findings. " The chronic co-morbidities include COPD.   * I certify that at the point of admission it is my clinical judgment that the patient will require inpatient hospital care spanning beyond 2 midnights from the point of admission due to high intensity of service, high risk for further deterioration and high frequency of surveillance required.Lonia Blood*    Alexandria Current,LAWAL MD [facility] Pager [phone]  If 7PM-7AM, please contact night-coverage [email or web address] Password [email or web address]  06/20/2020, 7:52 PM

## 2020-06-20 NOTE — ED Notes (Signed)
Pt placed in triage 8 to complete neb treatment

## 2020-06-20 NOTE — ED Provider Notes (Signed)
[facility] Emergency Department Provider Note    First MD Initiated Contact with Patient 06/20/20 1629     (approximate)  I have reviewed the triage vital signs and the nursing notes.   HISTORY  Chief Complaint Shortness of Breath and Cough    HPI Cody Alexander is a 64 y.o. male below listed past medical history presents to ER for evaluation of worsening exertional dyspnea cough congestion having fevers to 100 degrees last night.  Not on any antibiotics.  No recent steroids.  Can feel himself wheezing.  Having generalized malaise as well as some mild headaches.  Is having some nasal congestion.  Did have recent exposure to someone that he is concerned may have had Covid but uncertain as he did not know the person.  That was earlier this week.    Past Medical History:  Diagnosis Date  . CHF (congestive heart failure) (HCC)   . COPD (chronic obstructive pulmonary disease) (HCC)   . Diabetes mellitus without complication (HCC)   . Hypertension    History reviewed. No pertinent family history. Past Surgical History:  Procedure Laterality Date  . LEFT HEART CATH AND CORONARY ANGIOGRAPHY Right 02/27/2018   Procedure: LEFT HEART CATH AND CORONARY ANGIOGRAPHY;  Surgeon: Laurier Nancy, MD;  Location: [facility] INVASIVE CV LAB;  Service: Cardiovascular;  Laterality: Right;   Patient Active Problem List   Diagnosis Date Noted  . Non-ST elevation MI (NSTEMI) (HCC) 02/25/2018      Prior to Admission medications   Medication Sig Start Date End Date Taking? Authorizing Provider  aspirin EC 81 MG EC tablet Take 1 tablet (81 mg total) by mouth daily. 02/27/18   Katha Hamming, MD  atorvastatin (LIPITOR) 20 MG tablet Take 1 tablet (20 mg total) by mouth daily. 02/27/18 02/27/19  Katha Hamming, MD  Fluticasone-Salmeterol (WIXELA INHUB) 250-50 MCG/DOSE AEPB Inhale 1 puff into the lungs 2 (two) times daily. 02/27/18   Katha Hamming, MD   hydrochlorothiazide (HYDRODIURIL) 25 MG tablet Take 1 tablet (25 mg total) by mouth daily. 02/27/18   Katha Hamming, MD  isosorbide mononitrate (IMDUR) 30 MG 24 hr tablet Take 1 tablet (30 mg total) by mouth daily. 02/27/18 02/27/19  Katha Hamming, MD  lisinopril (PRINIVIL,ZESTRIL) 20 MG tablet Take 1 tablet (20 mg total) by mouth daily. 02/27/18   Katha Hamming, MD  metoprolol tartrate (LOPRESSOR) 25 MG tablet Take 1 tablet (25 mg total) by mouth 2 (two) times daily. 02/27/18   Katha Hamming, MD  tiotropium (SPIRIVA) 18 MCG inhalation capsule Place 1 capsule (18 mcg total) into inhaler and inhale daily. 02/27/18   Katha Hamming, MD    Allergies Erythromycin    Social History Social History   Tobacco Use  . Smoking status: Never Smoker  . Smokeless tobacco: Never Used  Substance Use Topics  . Alcohol use: No  . Drug use: Not on file    Review of Systems Patient denies headaches, rhinorrhea, blurry vision, numbness, shortness of breath, chest pain, edema, cough, abdominal pain, nausea, vomiting, diarrhea, dysuria, fevers, rashes or hallucinations unless otherwise stated above in HPI. ____________________________________________   PHYSICAL EXAM:  VITAL SIGNS: Vitals:   06/20/20 1444  BP: (!) 152/101  Pulse: 95  Temp: 98.2 F (36.8 C)  SpO2: 94%    Constitutional: Alert and oriented.  Eyes: Conjunctivae are normal.  Head: Atraumatic. Nose: No congestion/rhinnorhea. Mouth/Throat: Mucous membranes are moist.   Neck: No stridor. Painless ROM.  Cardiovascular: Normal rate, regular rhythm. Grossly normal  heart sounds.  Good peripheral circulation. Respiratory: Normal respiratory effort.  No retractions. Lungs CTAB. Gastrointestinal: Soft and nontender. No distention. No abdominal bruits. No CVA tenderness. Genitourinary:  Musculoskeletal: No lower extremity tenderness nor edema.  No joint effusions. Neurologic:  Normal speech and language. No gross  focal neurologic deficits are appreciated. No facial droop Skin:  Skin is warm, dry and intact. No rash noted. Psychiatric: Mood and affect are normal. Speech and behavior are normal.  ____________________________________________   LABS (all labs ordered are listed, but only abnormal results are displayed)  Results for orders placed or performed during the hospital encounter of 06/20/20 (from the past 24 hour(s))  Lactic acid, plasma     Status: None   Collection Time: 06/20/20  3:05 PM  Result Value Ref Range   Lactic Acid, Venous 1.4 0.5 - 1.9 mmol/L  Comprehensive metabolic panel     Status: Abnormal   Collection Time: 06/20/20  3:05 PM  Result Value Ref Range   Sodium 137 135 - 145 mmol/L   Potassium 3.9 3.5 - 5.1 mmol/L   Chloride 99 98 - 111 mmol/L   CO2 28 22 - 32 mmol/L   Glucose, Bld 94 70 - 99 mg/dL   BUN 17 8 - 23 mg/dL   Creatinine, Ser 1.610.90 0.61 - 1.24 mg/dL   Calcium 9.2 8.9 - 09.610.3 mg/dL   Total Protein 7.9 6.5 - 8.1 g/dL   Albumin 4.7 3.5 - 5.0 g/dL   AST 16 15 - 41 U/L   ALT 13 0 - 44 U/L   Alkaline Phosphatase 58 38 - 126 U/L   Total Bilirubin 1.5 (H) 0.3 - 1.2 mg/dL   GFR, Estimated >04>60 >54>60 mL/min   Anion gap 10 5 - 15  CBC with Differential     Status: Abnormal   Collection Time: 06/20/20  3:05 PM  Result Value Ref Range   WBC 6.9 4.0 - 10.5 K/uL   RBC 4.78 4.22 - 5.81 MIL/uL   Hemoglobin 14.8 13.0 - 17.0 g/dL   HCT 09.843.9 39 - 52 %   MCV 91.8 80.0 - 100.0 fL   MCH 31.0 26.0 - 34.0 pg   MCHC 33.7 30.0 - 36.0 g/dL   RDW 11.913.2 14.711.5 - 82.915.5 %   Platelets 230 150 - 400 K/uL   nRBC 0.0 0.0 - 0.2 %   Neutrophils Relative % 81 %   Neutro Abs 5.6 1.7 - 7.7 K/uL   Lymphocytes Relative 8 %   Lymphs Abs 0.6 (L) 0.7 - 4.0 K/uL   Monocytes Relative 9 %   Monocytes Absolute 0.6 0.1 - 1.0 K/uL   Eosinophils Relative 1 %   Eosinophils Absolute 0.1 0.0 - 0.5 K/uL   Basophils Relative 1 %   Basophils Absolute 0.0 0.0 - 0.1 K/uL   Immature Granulocytes 0 %   Abs  Immature Granulocytes 0.02 0.00 - 0.07 K/uL  Brain natriuretic peptide     Status: Abnormal   Collection Time: 06/20/20  3:05 PM  Result Value Ref Range   B Natriuretic Peptide 136.0 (H) 0.0 - 100.0 pg/mL  Troponin I (High Sensitivity)     Status: Abnormal   Collection Time: 06/20/20  3:05 PM  Result Value Ref Range   Troponin I (High Sensitivity) 89 (H) <18 ng/L  Lactic acid, plasma     Status: None   Collection Time: 06/20/20  4:58 PM  Result Value Ref Range   Lactic Acid, Venous 1.4 0.5 - 1.9 mmol/L  Troponin I (High Sensitivity)     Status: Abnormal   Collection Time: 06/20/20  5:02 PM  Result Value Ref Range   Troponin I (High Sensitivity) 91 (H) <18 ng/L  Respiratory Panel by RT PCR (Flu A&B, Covid) - Nasopharyngeal Swab     Status: None   Collection Time: 06/20/20  5:13 PM   Specimen: Nasopharyngeal Swab  Result Value Ref Range   SARS Coronavirus 2 by RT PCR NEGATIVE NEGATIVE   Influenza A by PCR NEGATIVE NEGATIVE   Influenza B by PCR NEGATIVE NEGATIVE   ____________________________________________  EKG My review and personal interpretation at Time: 15:06   Indication: sob  Rate: 90  Rhythm: sinus Axis: normal Other: nonspecific st abn, no stemi ____________________________________________  RADIOLOGY  I personally reviewed all radiographic images ordered to evaluate for the above acute complaints and reviewed radiology reports and findings.  These findings were personally discussed with the patient.  Please see medical record for radiology report.  ____________________________________________   PROCEDURES  Procedure(s) performed:  Procedures    Critical Care performed: no ____________________________________________   INITIAL IMPRESSION / ASSESSMENT AND PLAN / ED COURSE  Pertinent labs & imaging results that were available during my care of the patient were reviewed by me and considered in my medical decision making (see chart for details).   DDX: Asthma,  copd, CHF, pna, ptx, malignancy, Pe, anemia   Kindred Reidinger is a 64 y.o. who presents to the ED with respiratory symptoms as described above.  Patient satting okay on his chronic home O2 but mild tachypnea with diminished breath sounds throughout.  Will give nebulizer as well as steroid.  Also with recent sick contact concerning for Covid exposure.  Will test for Covid.  Have a lower suspicion for acute CHF based on chest x-ray and exam.  Lower suspicion for PE.  Chest x-ray without evidence of pneumonia and with the absence of fever or leukocytosis feel this is more likely COPD exacerbation.  Clinical Course as of Jun 20 1856  Tue Jun 20, 2020  [id number] Repeat troponin about the same.  I think this is all demand ischemia in the setting of his acute bronchitis.  Is having improvement with nebulizers but requiring multiple breathing treatments and steroids.  Still with mild tachypnea pretty significant coarse wheezing throughout.  Based on his symptoms including some syncopal episodes from coughing spells I believe the patient should be admitted to the hospital for additional nebulizers medical management.  Patient agreeable to plan.   [PR]    Clinical Course User Index [PR] Willy Eddy, MD    The patient was evaluated in Emergency Department today for the symptoms described in the history of present illness. He/she was evaluated in the context of the global COVID-19 pandemic, which necessitated consideration that the patient might be at risk for infection with the SARS-CoV-2 virus that causes COVID-19. Institutional protocols and algorithms that pertain to the evaluation of patients at risk for COVID-19 are in a state of rapid change based on information released by regulatory bodies including the CDC and federal and state organizations. These policies and algorithms were followed during the patient's care in the ED.  As part of my medical decision making, I reviewed the following data within  the electronic MEDICAL RECORD NUMBER Nursing notes reviewed and incorporated, Labs reviewed, notes from prior ED visits and [place] Controlled Substance Database   ____________________________________________   FINAL CLINICAL IMPRESSION(S) / ED DIAGNOSES  Final diagnoses:  COPD with acute exacerbation (HCC)  NEW MEDICATIONS STARTED DURING THIS VISIT:  New Prescriptions   No medications on file     Note:  This document was prepared using Dragon voice recognition software and may include unintentional dictation errors.    Willy Eddy, MD 06/20/20 [phone]

## 2020-06-21 DIAGNOSIS — J9621 Acute and chronic respiratory failure with hypoxia: Secondary | ICD-10-CM | POA: Diagnosis not present

## 2020-06-21 DIAGNOSIS — J9622 Acute and chronic respiratory failure with hypercapnia: Secondary | ICD-10-CM | POA: Diagnosis not present

## 2020-06-21 LAB — COMPREHENSIVE METABOLIC PANEL
ALT: 12 U/L (ref 0–44)
AST: 19 U/L (ref 15–41)
Albumin: 3.9 g/dL (ref 3.5–5.0)
Alkaline Phosphatase: 49 U/L (ref 38–126)
Anion gap: 10 (ref 5–15)
BUN: 17 mg/dL (ref 8–23)
CO2: 25 mmol/L (ref 22–32)
Calcium: 9.1 mg/dL (ref 8.9–10.3)
Chloride: 102 mmol/L (ref 98–111)
Creatinine, Ser: 0.81 mg/dL (ref 0.61–1.24)
GFR, Estimated: >60 mL/min (ref >60)
Glucose, Bld: 223 mg/dL — ABNORMAL HIGH (ref 70–99)
Potassium: 4 mmol/L (ref 3.5–5.1)
Sodium: 137 mmol/L (ref 135–145)
Total Bilirubin: 0.6 mg/dL (ref 0.3–1.2)
Total Protein: 7 g/dL (ref 6.5–8.1)

## 2020-06-21 LAB — CBG MONITORING, ED: Glucose-Capillary: 208 mg/dL — ABNORMAL HIGH (ref 70–99)

## 2020-06-21 LAB — CBC WITH DIFFERENTIAL/PLATELET
Abs Immature Granulocytes: 0.03 10*3/uL (ref 0.00–0.07)
Basophils Absolute: 0 10*3/uL (ref 0.0–0.1)
Basophils Relative: 0 %
Eosinophils Absolute: 0 10*3/uL (ref 0.0–0.5)
Eosinophils Relative: 0 %
HCT: 39.9 % (ref 39.0–52.0)
Hemoglobin: 13.3 g/dL (ref 13.0–17.0)
Immature Granulocytes: 1 %
Lymphocytes Relative: 6 %
Lymphs Abs: 0.3 10*3/uL — ABNORMAL LOW (ref 0.7–4.0)
MCH: 30.6 pg (ref 26.0–34.0)
MCHC: 33.3 g/dL (ref 30.0–36.0)
MCV: 91.9 fL (ref 80.0–100.0)
Monocytes Absolute: 0.1 10*3/uL (ref 0.1–1.0)
Monocytes Relative: 2 %
Neutro Abs: 3.9 10*3/uL (ref 1.7–7.7)
Neutrophils Relative %: 91 %
Platelets: 214 10*3/uL (ref 150–400)
RBC: 4.34 MIL/uL (ref 4.22–5.81)
RDW: 13.1 % (ref 11.5–15.5)
WBC: 4.3 10*3/uL (ref 4.0–10.5)
nRBC: 0 % (ref 0.0–0.2)

## 2020-06-21 LAB — GLUCOSE, CAPILLARY
Glucose-Capillary: 234 mg/dL — ABNORMAL HIGH (ref 70–99)
Glucose-Capillary: 169 mg/dL — ABNORMAL HIGH (ref 70–99)
Glucose-Capillary: 256 mg/dL — ABNORMAL HIGH (ref 70–99)

## 2020-06-21 MED ORDER — IPRATROPIUM-ALBUTEROL 0.5-2.5 (3) MG/3ML IN SOLN
3.0000 mL | Freq: Four times a day (QID) | RESPIRATORY_TRACT | Status: DC
  Administered 2020-06-21 – 2020-06-24 (×14): 3 mL via RESPIRATORY_TRACT
  Filled 2020-06-21 (×14): qty 3

## 2020-06-21 MED ORDER — AZITHROMYCIN 500 MG PO TABS: 500.0000 mg | ORAL_TABLET | Freq: Every day | ORAL | Status: DC

## 2020-06-21 MED ORDER — POLYETHYLENE GLYCOL 3350 17 G PO PACK: 17.0000 g | PACK | Freq: Every evening | ORAL | Status: DC | PRN

## 2020-06-21 MED ORDER — GUAIFENESIN-CODEINE 100-10 MG/5ML PO SOLN
10.0000 mL | Freq: Four times a day (QID) | ORAL | Status: DC | PRN
  Administered 2020-06-21 – 2020-06-24 (×11): 10 mL via ORAL
  Filled 2020-06-21 (×11): qty 10

## 2020-06-21 MED ORDER — ACETAMINOPHEN 325 MG PO TABS
650.0000 mg | ORAL_TABLET | Freq: Four times a day (QID) | ORAL | Status: DC | PRN
  Administered 2020-06-21 – 2020-06-23 (×2): 650 mg via ORAL
  Filled 2020-06-21 (×2): qty 2

## 2020-06-21 MED ORDER — BISACODYL 10 MG RE SUPP: 10.0000 mg | Freq: Every day | RECTAL | Status: DC | PRN

## 2020-06-21 NOTE — ED Notes (Signed)
Called to room.  Patient stating he 'passed out' after coughing in bed.  Patient is awake and alert.  NAD.  Strong cough noted, non productive.  No SOB/ DOE.  NAD.

## 2020-06-21 NOTE — Evaluation (Signed)
Occupational Therapy Evaluation Patient Details Name: Cody Alexander MRN: [id number] DOB: Oct 14, 1955 Today's Date: 06/21/2020    History of Present Illness 64 y.o. male with medical history significant of COPD, systolic dysfunction CHF, diabetes, hypertension, morbid obesity who presented to the ER with progressive shortness of breath cough congestion and fevers.  Patient has had recurrent hospitalizations due to COPD exacerbation.    Clinical Impression   Pt IND in functional mobility, reports no new onset pain (has chronic L knee pain), moves easily with no LOB, and states that he was IND in all ADL/IADL prior to this hospitalization. He lives with an 27 yo, to whom he provides basic care services. Pt reports 2 falls in past year, both occurring on what he describes as "slippery" surfaces. Cody Alexander states that he is eager to be able to breathe better, but that he does not feel he needs any ongoing services to address mobility or functional concerns. Recommend no further OT at this time.     Follow Up Recommendations  No OT follow up    Equipment Recommendations  None recommended by OT    Recommendations for Other Services       Precautions / Restrictions Precautions Precautions: None Restrictions Weight Bearing Restrictions: No      Mobility Bed Mobility Overal bed mobility: Independent             General bed mobility comments: Pt able to easily get to EOB w/o assist    Transfers Overall transfer level: Independent Equipment used: None             General transfer comment: able to rise to standing w/o hesitation or assist    Balance Overall balance assessment: Modified Independent                                         ADL either performed or assessed with clinical judgement   ADL Overall ADL's : Independent                                             Vision Baseline Vision/History: Wears glasses Wears  Glasses: At all times Patient Visual Report: No change from baseline       Perception     Praxis      Pertinent Vitals/Pain Pain Assessment: 0-10 Pain Location: L knee has occasional pain, s/p fall several months ago     Hand Dominance     Extremity/Trunk Assessment Upper Extremity Assessment Upper Extremity Assessment: Overall WFL for tasks assessed   Lower Extremity Assessment Lower Extremity Assessment: Overall WFL for tasks assessed       Communication Communication Communication: No difficulties   Cognition Arousal/Alertness: Awake/alert Behavior During Therapy: WFL for tasks assessed/performed Overall Cognitive Status: Within Functional Limits for tasks assessed                                     General Comments  Pt has heavy cough    Exercises     Shoulder Instructions      Home Living Family/patient expects to be discharged to:: Private residence Living Arrangements: Other (Comment) (Pt lives with an 49 yo, to whom he provides care) Available Help at  Discharge: Family;Available PRN/intermittently Type of Home: House Home Access: Stairs to enter [employer] of Steps: 3         Bathroom Shower/Tub: Theme park manager: No   Home Equipment: None          Prior Functioning/Environment Level of Independence: Independent        Comments: Pt does shopping, takes care of household chores and yardwork, including mowing a 2+ acre yard        OT Problem List: Decreased activity tolerance;Decreased strength      OT Treatment/Interventions:      OT Goals(Current goals can be found in the care plan section) Acute Rehab OT Goals Patient Stated Goal: feel better, able to breath better OT Goal Formulation: With patient Time For Goal Achievement: 07/05/20 Potential to Achieve Goals: Good  OT Frequency:     Barriers to D/C:            Co-evaluation               AM-PAC OT "6 Clicks" Daily Activity     Outcome Measure Help from another person eating meals?: None Help from another person taking care of personal grooming?: None Help from another person toileting, which includes using toliet, bedpan, or urinal?: None Help from another person bathing (including washing, rinsing, drying)?: None Help from another person to put on and taking off regular upper body clothing?: None Help from another person to put on and taking off regular lower body clothing?: None 6 Click Score: 24   End of Session    Activity Tolerance: Patient tolerated treatment well Patient left: in bed;with call bell/phone within reach  OT Visit Diagnosis: Muscle weakness (generalized) (M62.81)                Time: 4765-4650 OT Time Calculation (min): 11 min Charges:  OT General Charges $OT Visit: 1 Visit OT Evaluation $OT Eval Low Complexity: 1 Low OT Treatments $Self Care/Home Management : 8-22 mins  Latina Craver, PhD, MS, OTR/L ascom [phone] 06/21/20, 4:30 PM

## 2020-06-21 NOTE — Progress Notes (Signed)
PROGRESS NOTE    Cody Alexander  MOQ:[id number] DOB: 07/11/1956 DOA: 06/20/2020 PCP: Mckinley Jewel, FNP    Assessment & Plan:   Principal Problem:   Acute on chronic respiratory failure with hypoxia and hypercapnia (HCC) Active Problems:   COPD with acute exacerbation (HCC)   DM type 2 with diabetic peripheral neuropathy (HCC)   Essential hypertension   Obesity, Class III, BMI 40-49.9 (morbid obesity) (HCC)   Chronic combined systolic and diastolic heart failure (HCC)   Cody Alexander is a 64 y.o. male with medical history significant of COPD, systolic dysfunction CHF, diabetes, hypertension, morbid obesity who presented to the ER with progressive shortness of breath cough congestion and fevers.  Patient has had recurrent hospitalizations due to COPD exacerbation.  He came in with oxygen sats in the 80s.  Also having significant bilateral wheezing.  He denied any sick contact.  COVID-19 screen is so far negative.  He is having generalized malaise with his symptoms.  Patient evaluated and found to have acute on chronic respiratory failure secondary to COPD exacerbation.   #1 acute on chronic respiratory failure with hypoxia  # Chronic hypoxic respiratory failure on 2L baseline --presented with oxygen sats in the 80s.  CXR showed no acute process.  Likely COPD exacerbation. PLAN: --treat COPD exacerbation --Continue supplemental O2 to keep sats between 88-92%  # COPD exacerbation --started on steroid and DuoNeb and cefepime PLAN: --continue steroid and taper --d/c cefepime as no indication --DuoNeb QID --chest PT QID and flutter valve --guaifenesin-codeine PRN   # diabetes:  --SSI  #4 essential hypertension:  --resume home HCTZ, lisinopril, metop and Imdur  #5 combined systolic and diastolic heart failure:  Appears compensated.   --resume home HCTZ, lisinopril, metop and Imdur  #6 morbid obesity:  Dietary counseling.   DVT prophylaxis: Lovenox SQ Code  Status: Full code  Family Communication:  Status is: inpatient Dispo:   The patient is from: home Anticipated d/c is to: home Anticipated d/c date is: 2-3 days Patient currently is not medically stable to d/c due to: acute COPD exacerbation with hypoxia.   Subjective and Interval History:  Pt reported severe coughing spells that caused him to "pass out" x2 due to hyperventilation.   Could not bring up any sputum.     Objective: Vitals:   06/21/20 1653 06/21/20 2007 06/21/20 2050 06/21/20 2320  BP: (!) 159/99 (!) 154/95  (!) 152/95  Pulse: (!) 105 99  89  Resp: 18 20  17   Temp: 98 F (36.7 C) 98 F (36.7 C)  98.5 F (36.9 C)  TempSrc: Oral Oral  Oral  SpO2: 98% 96% 95% 96%  Weight:      Height:        Intake/Output Summary (Last 24 hours) at 06/22/2020 0227 Last data filed at 06/21/2020 1900 Gross per 24 hour  Intake 340 ml  Output 770 ml  Net -430 ml   Filed Weights   06/20/20 1449  Weight: 114.3 kg    Examination:   Constitutional: NAD, AAOx3 HEENT: conjunctivae and lids normal, EOMI CV: RRR no M,R,G. Distal pulses +2.  No cyanosis.   RESP: rattling breath sounds, loud ronchi, frequent cough, on 2L (baseline) GI: +BS, NTND Extremities: No effusions, edema, or tenderness in BLE SKIN: warm, dry and intact Neuro: II - XII grossly intact.  Sensation intact Psych: Normal mood and affect.     Data Reviewed: I have personally reviewed following labs and imaging studies  CBC: Recent Labs  Lab  06/20/20 1505 06/21/20 0524  WBC 6.9 4.3  NEUTROABS 5.6 3.9  HGB 14.8 13.3  HCT 43.9 39.9  MCV 91.8 91.9  PLT 230 214   Basic Metabolic Panel: Recent Labs  Lab 06/20/20 1505 06/21/20 0524  NA 137 137  K 3.9 4.0  CL 99 102  CO2 28 25  GLUCOSE 94 223*  BUN 17 17  CREATININE 0.90 0.81  CALCIUM 9.2 9.1   GFR: Estimated Creatinine Clearance: 123.7 mL/min (by C-G formula based on SCr of 0.81 mg/dL). Liver Function Tests: Recent Labs  Lab 06/20/20 1505  06/21/20 0524  AST 16 19  ALT 13 12  ALKPHOS 58 49  BILITOT 1.5* 0.6  PROT 7.9 7.0  ALBUMIN 4.7 3.9   No results for input(s): LIPASE, AMYLASE in the last 168 hours. No results for input(s): AMMONIA in the last 168 hours. Coagulation Profile: No results for input(s): INR, PROTIME in the last 168 hours. Cardiac Enzymes: No results for input(s): CKTOTAL, CKMB, CKMBINDEX, TROPONINI in the last 168 hours. BNP (last 3 results) No results for input(s): PROBNP in the last 8760 hours. HbA1C: No results for input(s): HGBA1C in the last 72 hours. CBG: Recent Labs  Lab 06/20/20 2054 06/21/20 0835 06/21/20 1158 06/21/20 1705 06/21/20 2107  GLUCAP 197* 234* 208* 169* 256*   Lipid Profile: No results for input(s): CHOL, HDL, LDLCALC, TRIG, CHOLHDL, LDLDIRECT in the last 72 hours. Thyroid Function Tests: No results for input(s): TSH, T4TOTAL, FREET4, T3FREE, THYROIDAB in the last 72 hours. Anemia Panel: No results for input(s): VITAMINB12, FOLATE, FERRITIN, TIBC, IRON, RETICCTPCT in the last 72 hours. Sepsis Labs: Recent Labs  Lab 06/20/20 1505 06/20/20 1658  LATICACIDVEN 1.4 1.4    Recent Results (from the past 240 hour(s))  Respiratory Panel by RT PCR (Flu A&B, Covid) - Nasopharyngeal Swab     Status: None   Collection Time: 06/20/20  5:13 PM   Specimen: Nasopharyngeal Swab  Result Value Ref Range Status   SARS Coronavirus 2 by RT PCR NEGATIVE NEGATIVE Final    Comment: (NOTE) SARS-CoV-2 target nucleic acids are NOT DETECTED.  The SARS-CoV-2 RNA is generally detectable in upper respiratoy specimens during the acute phase of infection. The lowest concentration of SARS-CoV-2 viral copies this assay can detect is 131 copies/mL. A negative result does not preclude SARS-Cov-2 infection and should not be used as the sole basis for treatment or other patient management decisions. A negative result may occur with  improper specimen collection/handling, submission of specimen  other than nasopharyngeal swab, presence of viral mutation(s) within the areas targeted by this assay, and inadequate number of viral copies (<131 copies/mL). A negative result must be combined with clinical observations, patient history, and epidemiological information. The expected result is Negative.  Fact Sheet for Patients:  https://www.moore.com/  Fact Sheet for Healthcare Providers:  https://www.young.biz/  This test is no t yet approved or cleared by the [place] FDA and  has been authorized for detection and/or diagnosis of SARS-CoV-2 by FDA under an Emergency Use Authorization (EUA). This EUA will remain  in effect (meaning this test can be used) for the duration of the COVID-19 declaration under Section 564(b)(1) of the Act, 21 U.S.C. section 360bbb-3(b)(1), unless the authorization is terminated or revoked sooner.     Influenza A by PCR NEGATIVE NEGATIVE Final   Influenza B by PCR NEGATIVE NEGATIVE Final    Comment: (NOTE) The Xpert Xpress SARS-CoV-2/FLU/RSV assay is intended as an aid in  the diagnosis of influenza  from Nasopharyngeal swab specimens and  should not be used as a sole basis for treatment. Nasal washings and  aspirates are unacceptable for Xpert Xpress SARS-CoV-2/FLU/RSV  testing.  Fact Sheet for Patients: https://www.moore.com/  Fact Sheet for Healthcare Providers: https://www.young.biz/  This test is not yet approved or cleared by the [place] FDA and  has been authorized for detection and/or diagnosis of SARS-CoV-2 by  FDA under an Emergency Use Authorization (EUA). This EUA will remain  in effect (meaning this test can be used) for the duration of the  Covid-19 declaration under Section 564(b)(1) of the Act, 21  U.S.C. section 360bbb-3(b)(1), unless the authorization is  terminated or revoked. Performed at [facility], [address]  [place], [place] [address]       Radiology Studies: DG Chest 2 View  Result Date: 06/20/2020 CLINICAL DATA:  Shortness of breath EXAM: CHEST - 2 VIEW COMPARISON:  Radiograph 01/12/2019 FINDINGS: Mildly coarsened interstitial opacities are similar to prior. Few bandlike opacities in the periphery of the lung bases favor atelectasis and or scarring. No new focal consolidative opacity or features of edema. No pneumothorax or visible effusion. The aorta is calcified. The remaining cardiomediastinal contours are unremarkable. No acute osseous or soft tissue abnormality. IMPRESSION: Chronic interstitial changes and bibasilar atelectasis and or scarring. No other acute cardiopulmonary disease. Electronically Signed   By: Kreg Shropshire M.D.   On: 06/20/2020 15:54     Scheduled Meds: . enoxaparin (LOVENOX) injection  0.5 mg/kg Subcutaneous Q24H  . insulin aspart  0-20 Units Subcutaneous TID WC  . insulin aspart  0-5 Units Subcutaneous QHS  . ipratropium-albuterol  3 mL Nebulization QID  . predniSONE  40 mg Oral Q breakfast   Continuous Infusions:   LOS: 2 days     [name], MD [facility] If 7PM-7AM, please contact night-coverage 06/22/2020, 2:27 AM

## 2020-06-21 NOTE — Evaluation (Signed)
Physical Therapy Evaluation Patient Details Name: Cody Alexander MRN: [id number] DOB: August 23, 1956 Today's Date: 06/21/2020   History of Present Illness  64 y.o. male with medical history significant of COPD, systolic dysfunction CHF, diabetes, hypertension, morbid obesity who presented to the ER with progressive shortness of breath cough congestion and fevers.  Patient has had recurrent hospitalizations due to COPD exacerbation.   Clinical Impression  Pt did well with PT exam, walking ~300 ft w/o AD or supplemental O2.  He reports he wears O2 most of the time, but does not have mobile unit so is used to walking moderate distances on room air and requests to do so today.  He did maintain his sats near 90% much of the time (+/- 3) and though he had some coughing/wheezing he did not have excessive fatigue with the effort.  Pt with no LOBs or overt safety issues, good overall effort and safety; no further PT needs.  Will complete PT orders at this time.    Follow Up Recommendations No PT follow up    Equipment Recommendations  None recommended by PT (discussed cane/walking stick for energy conservation)    Recommendations for Other Services       Precautions / Restrictions Precautions Precautions:  (low fall risk) Restrictions Weight Bearing Restrictions: No      Mobility  Bed Mobility Overal bed mobility: Independent             General bed mobility comments: Pt able to easily get to EOB w/o assist    Transfers Overall transfer level: Independent Equipment used: None             General transfer comment: able to rise to standing w/o hesitation or assist  Ambulation/Gait Ambulation/Gait assistance: Supervision Gait Distance (Feet): 300 Feet Assistive device: None       General Gait Details: Pt did very well with ~300 ft of ambulation w/o AD or O2.  Good overall tolerance with sats staying in the low 90s high 80s range with some fatigue but no overt LOBs or  excessive fatigue.  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) Available Help at Discharge: Family;Available PRN/intermittently Type of Home: House Home Access: Stairs to enter   Entrance Stairs-Number of Steps: 3   Home Equipment: None      Prior Function Level of Independence: Independent         Comments: Pt reports that he is out of the home running errands ~1x/wk, able to do ADLs, etc w/o assist, helps room mate     Hand Dominance        Extremity/Trunk Assessment   Upper Extremity Assessment Upper Extremity Assessment: Overall WFL for tasks assessed    Lower Extremity Assessment Lower Extremity Assessment: Overall WFL for tasks assessed       Communication   Communication: No difficulties  Cognition Arousal/Alertness: Awake/alert Behavior During Therapy: WFL for tasks assessed/performed Overall  Cognitive Status: Within Functional Limits for tasks assessed                                        General Comments      Exercises     Assessment/Plan    PT Assessment Patent does not need any further PT services  PT Problem List Decreased activity tolerance;Cardiopulmonary status limiting activity       PT Treatment Interventions      PT Goals (Current goals can be found in the Care Plan section)  Acute Rehab PT Goals Patient Stated Goal: get breathing better PT Goal Formulation: All assessment and education complete, DC therapy    Frequency     Barriers to discharge        Co-evaluation               AM-PAC PT "6 Clicks"  Mobility  Outcome Measure Help needed turning from your back to your side while in a flat bed without using bedrails?: None Help needed moving from lying on your back to sitting on the side of a flat bed without using bedrails?: None Help needed moving to and from a bed to a chair (including a wheelchair)?: None Help needed standing up from a chair using your arms (e.g., wheelchair or bedside chair)?: None Help needed to walk in hospital room?: None Help needed climbing 3-5 steps with a railing? : None 6 Click Score: 24    End of Session Equipment Utilized During Treatment: Gait belt Activity Tolerance: Patient tolerated treatment well Patient left: in chair   PT Visit Diagnosis: Difficulty in walking, not elsewhere classified (R26.2);Muscle weakness (generalized) (M62.81)    Time: 7253-6644 PT Time Calculation (min) (ACUTE ONLY): 21 min   Charges:   PT Evaluation $PT Eval Low Complexity: 1 Low          Malachi Pro, DPT 06/21/2020, 2:06 PM

## 2020-06-21 NOTE — Progress Notes (Signed)
Pt called out saying he is passing out after having a coughing spell. md updated. Will continue to monitor

## 2020-06-21 NOTE — Progress Notes (Addendum)
Inpatient Diabetes Program Recommendations  AACE/ADA: New Consensus Statement on Inpatient Glycemic Control (2015)  Target Ranges:  Prepandial:   less than 140 mg/dL      Peak postprandial:   less than 180 mg/dL (1-2 hours)      Critically ill patients:  140 - 180 mg/dL   Lab Results  Component Value Date   GLUCAP 234 (H) 06/21/2020   HGBA1C 8.3 (H) 02/25/2018    Review of Glycemic Control  Diabetes history: DM 2 Outpatient Diabetes medications: Metformin 1000 mg bid Current orders for Inpatient glycemic control:  Novolog 0-20 units tid + hs  Solumedrol 125 mg Q6 hours PO prednisone 40 mg Daily starting on 10/28  Inpatient Diabetes Program Recommendations:    High dose steroids with COPD exacerbation  - Consider Levemir 10 units (less than 0-1 units/kg)  Will need reduction in Novolog Correction tomorrow.  Thanks,  Christena Deem RN, MSN, BC-ADM Inpatient Diabetes Coordinator Team Pager [phone] (8a-5p)

## 2020-06-21 NOTE — ED Notes (Signed)
Pt placed in hospital bed at this time

## 2020-06-22 DIAGNOSIS — J9621 Acute and chronic respiratory failure with hypoxia: Secondary | ICD-10-CM | POA: Diagnosis not present

## 2020-06-22 DIAGNOSIS — J9622 Acute and chronic respiratory failure with hypercapnia: Secondary | ICD-10-CM | POA: Diagnosis not present

## 2020-06-22 LAB — BASIC METABOLIC PANEL
Anion gap: 10 (ref 5–15)
BUN: 26 mg/dL — ABNORMAL HIGH (ref 8–23)
CO2: 26 mmol/L (ref 22–32)
Calcium: 9.1 mg/dL (ref 8.9–10.3)
Chloride: 102 mmol/L (ref 98–111)
Creatinine, Ser: 0.81 mg/dL (ref 0.61–1.24)
GFR, Estimated: >60 mL/min (ref >60)
Glucose, Bld: 144 mg/dL — ABNORMAL HIGH (ref 70–99)
Potassium: 4.1 mmol/L (ref 3.5–5.1)
Sodium: 138 mmol/L (ref 135–145)

## 2020-06-22 LAB — CBC
HCT: 38.9 % — ABNORMAL LOW (ref 39.0–52.0)
Hemoglobin: 12.9 g/dL — ABNORMAL LOW (ref 13.0–17.0)
MCH: 30.9 pg (ref 26.0–34.0)
MCHC: 33.2 g/dL (ref 30.0–36.0)
MCV: 93.3 fL (ref 80.0–100.0)
Platelets: 231 10*3/uL (ref 150–400)
RBC: 4.17 MIL/uL — ABNORMAL LOW (ref 4.22–5.81)
RDW: 13.6 % (ref 11.5–15.5)
WBC: 15.3 10*3/uL — ABNORMAL HIGH (ref 4.0–10.5)
nRBC: 0 % (ref 0.0–0.2)

## 2020-06-22 LAB — GLUCOSE, CAPILLARY
Glucose-Capillary: 126 mg/dL — ABNORMAL HIGH (ref 70–99)
Glucose-Capillary: 108 mg/dL — ABNORMAL HIGH (ref 70–99)
Glucose-Capillary: 150 mg/dL — ABNORMAL HIGH (ref 70–99)
Glucose-Capillary: 116 mg/dL — ABNORMAL HIGH (ref 70–99)

## 2020-06-22 LAB — HEMOGLOBIN A1C
Hgb A1c MFr Bld: 5.7 % — ABNORMAL HIGH (ref 4.8–5.6)
Mean Plasma Glucose: 117 mg/dL

## 2020-06-22 LAB — HIV ANTIBODY (ROUTINE TESTING W REFLEX): HIV Screen 4th Generation wRfx: NONREACTIVE

## 2020-06-22 LAB — MAGNESIUM: Magnesium: 2.4 mg/dL (ref 1.7–2.4)

## 2020-06-22 MED ORDER — ASPIRIN EC 81 MG PO TBEC
81.0000 mg | DELAYED_RELEASE_TABLET | Freq: Every day | ORAL | Status: DC
  Administered 2020-06-22 – 2020-06-24 (×3): 81 mg via ORAL
  Filled 2020-06-22 (×3): qty 1

## 2020-06-22 MED ORDER — AZITHROMYCIN 500 MG PO TABS
500.0000 mg | ORAL_TABLET | Freq: Every day | ORAL | Status: DC
  Administered 2020-06-22 – 2020-06-24 (×3): 500 mg via ORAL
  Filled 2020-06-22 (×2): qty 1

## 2020-06-22 MED ORDER — LISINOPRIL 20 MG PO TABS
20.0000 mg | ORAL_TABLET | Freq: Two times a day (BID) | ORAL | Status: DC
  Administered 2020-06-22 – 2020-06-24 (×5): 20 mg via ORAL
  Filled 2020-06-22 (×4): qty 1
  Filled 2020-06-22: qty 2

## 2020-06-22 MED ORDER — METOPROLOL TARTRATE 25 MG PO TABS
25.0000 mg | ORAL_TABLET | Freq: Two times a day (BID) | ORAL | Status: DC
  Administered 2020-06-23 – 2020-06-24 (×3): 25 mg via ORAL
  Filled 2020-06-22 (×3): qty 1

## 2020-06-22 MED ORDER — LISINOPRIL 20 MG PO TABS: 20.0000 mg | ORAL_TABLET | Freq: Two times a day (BID) | ORAL | Status: DC

## 2020-06-22 MED ORDER — HYDROCHLOROTHIAZIDE 12.5 MG PO CAPS
12.5000 mg | ORAL_CAPSULE | Freq: Every day | ORAL | Status: DC
  Administered 2020-06-22 – 2020-06-24 (×3): 12.5 mg via ORAL
  Filled 2020-06-22 (×3): qty 1

## 2020-06-22 MED ORDER — ISOSORBIDE MONONITRATE ER 30 MG PO TB24
30.0000 mg | ORAL_TABLET | Freq: Every day | ORAL | Status: DC
  Administered 2020-06-22 – 2020-06-24 (×3): 30 mg via ORAL
  Filled 2020-06-22 (×3): qty 1

## 2020-06-22 MED ORDER — ACETYLCYSTEINE 20 % IN SOLN
2.0000 mL | Freq: Four times a day (QID) | RESPIRATORY_TRACT | Status: DC
  Administered 2020-06-22 – 2020-06-23 (×3): 2 mL via RESPIRATORY_TRACT
  Administered 2020-06-23: 4 mL via RESPIRATORY_TRACT
  Administered 2020-06-23 – 2020-06-24 (×4): 2 mL via RESPIRATORY_TRACT
  Filled 2020-06-22 (×15): qty 4

## 2020-06-22 NOTE — Progress Notes (Signed)
PROGRESS NOTE    Cody Alexander  RKY:[id number] DOB: 1956-04-17 DOA: 06/20/2020 PCP: Mckinley Jewel, FNP    Assessment & Plan:   Principal Problem:   Acute on chronic respiratory failure with hypoxia and hypercapnia (HCC) Active Problems:   COPD with acute exacerbation (HCC)   DM type 2 with diabetic peripheral neuropathy (HCC)   Essential hypertension   Obesity, Class III, BMI 40-49.9 (morbid obesity) (HCC)   Chronic combined systolic and diastolic heart failure (HCC)   Cody Alexander is a 64 y.o. male with medical history significant of COPD, systolic dysfunction CHF, diabetes, hypertension, morbid obesity who presented to the ER with progressive shortness of breath cough congestion and fevers.  Patient has had recurrent hospitalizations due to COPD exacerbation.  He came in with oxygen sats in the 80s.  Also having significant bilateral wheezing.  He denied any sick contact.  COVID-19 screen is so far negative.  He is having generalized malaise with his symptoms.  Patient evaluated and found to have acute on chronic respiratory failure secondary to COPD exacerbation.   #1 acute on chronic respiratory failure with hypoxia  # Chronic hypoxic respiratory failure on 2L baseline --presented with oxygen sats in the 80s.  CXR showed no acute process.  Likely COPD exacerbation. PLAN: --treat COPD exacerbation --Continue supplemental O2 to keep sats between 88-92%  # COPD exacerbation --started on steroid and DuoNeb and cefepime (d/c'ed) PLAN: --continue steroid as prednisone 40 mg daily --DuoNeb QID --chest PT QID with Mucomyst neb --flutter valve --guaifenesin-codeine PRN   # diabetes:  --SSI  #4 essential hypertension:  --on home HCTZ, lisinopril, metop and Imdur --cont home regimen  #5 combined systolic and diastolic heart failure:  Appears compensated.   --on home HCTZ, lisinopril, metop and Imdur --cont home regimen  #6 morbid obesity:  Dietary  counseling.   DVT prophylaxis: Lovenox SQ Code Status: Full code  Family Communication:  Status is: inpatient Dispo:   The patient is from: home Anticipated d/c is to: home Anticipated d/c date is: 2-3 days Patient currently is not medically stable to d/c due to: acute COPD exacerbation with hypoxia.   Subjective and Interval History:  Pt still had coughs, but hadn't had more passing out episodes.  Still hasn't coughed up sputum.     Objective: Vitals:   06/22/20 0855 06/22/20 1232 06/22/20 1536 06/22/20 1538  BP:   (!) 134/91   Pulse:   67   Resp:   20   Temp:   97.9 F (36.6 C)   TempSrc:   Oral   SpO2: 97% 97% 99% 99%  Weight:      Height:        Intake/Output Summary (Last 24 hours) at 06/22/2020 1744 Last data filed at 06/22/2020 1300 Gross per 24 hour  Intake 960 ml  Output 0 ml  Net 960 ml   Filed Weights   06/20/20 1449  Weight: 114.3 kg    Examination:   Constitutional: NAD, AAOx3 HEENT: conjunctivae and lids normal, EOMI CV: No cyanosis.   RESP: Loud rhonchi, on 2L Extremities: No effusions, edema in BLE SKIN: warm, dry and intact Neuro: II - XII grossly intact.   Psych: Normal mood and affect.      Data Reviewed: I have personally reviewed following labs and imaging studies  CBC: Recent Labs  Lab 06/20/20 1505 06/21/20 0524 06/22/20 0430  WBC 6.9 4.3 15.3*  NEUTROABS 5.6 3.9  --   HGB 14.8 13.3 12.9*  HCT 43.9  39.9 38.9*  MCV 91.8 91.9 93.3  PLT 230 214 231   Basic Metabolic Panel: Recent Labs  Lab 06/20/20 1505 06/21/20 0524 06/22/20 0430  NA 137 137 138  K 3.9 4.0 4.1  CL 99 102 102  CO2 28 25 26   GLUCOSE 94 223* 144*  BUN 17 17 26*  CREATININE 0.90 0.81 0.81  CALCIUM 9.2 9.1 9.1  MG  --   --  2.4   GFR: Estimated Creatinine Clearance: 123.7 mL/min (by C-G formula based on SCr of 0.81 mg/dL). Liver Function Tests: Recent Labs  Lab 06/20/20 1505 06/21/20 0524  AST 16 19  ALT 13 12  ALKPHOS 58 49  BILITOT  1.5* 0.6  PROT 7.9 7.0  ALBUMIN 4.7 3.9   No results for input(s): LIPASE, AMYLASE in the last 168 hours. No results for input(s): AMMONIA in the last 168 hours. Coagulation Profile: No results for input(s): INR, PROTIME in the last 168 hours. Cardiac Enzymes: No results for input(s): CKTOTAL, CKMB, CKMBINDEX, TROPONINI in the last 168 hours. BNP (last 3 results) No results for input(s): PROBNP in the last 8760 hours. HbA1C: No results for input(s): HGBA1C in the last 72 hours. CBG: Recent Labs  Lab 06/21/20 1705 06/21/20 2107 06/22/20 0734 06/22/20 1135 06/22/20 1633  GLUCAP 169* 256* 126* 108* 150*   Lipid Profile: No results for input(s): CHOL, HDL, LDLCALC, TRIG, CHOLHDL, LDLDIRECT in the last 72 hours. Thyroid Function Tests: No results for input(s): TSH, T4TOTAL, FREET4, T3FREE, THYROIDAB in the last 72 hours. Anemia Panel: No results for input(s): VITAMINB12, FOLATE, FERRITIN, TIBC, IRON, RETICCTPCT in the last 72 hours. Sepsis Labs: Recent Labs  Lab 06/20/20 1505 06/20/20 1658  LATICACIDVEN 1.4 1.4    Recent Results (from the past 240 hour(s))  Respiratory Panel by RT PCR (Flu A&B, Covid) - Nasopharyngeal Swab     Status: None   Collection Time: 06/20/20  5:13 PM   Specimen: Nasopharyngeal Swab  Result Value Ref Range Status   SARS Coronavirus 2 by RT PCR NEGATIVE NEGATIVE Final    Comment: (NOTE) SARS-CoV-2 target nucleic acids are NOT DETECTED.  The SARS-CoV-2 RNA is generally detectable in upper respiratoy specimens during the acute phase of infection. The lowest concentration of SARS-CoV-2 viral copies this assay can detect is 131 copies/mL. A negative result does not preclude SARS-Cov-2 infection and should not be used as the sole basis for treatment or other patient management decisions. A negative result may occur with  improper specimen collection/handling, submission of specimen other than nasopharyngeal swab, presence of viral mutation(s) within  the areas targeted by this assay, and inadequate number of viral copies (<131 copies/mL). A negative result must be combined with clinical observations, patient history, and epidemiological information. The expected result is Negative.  Fact Sheet for Patients:  06/22/20  Fact Sheet for Healthcare Providers:  https://www.moore.com/  This test is no t yet approved or cleared by the https://www.young.biz/ FDA and  has been authorized for detection and/or diagnosis of SARS-CoV-2 by FDA under an Emergency Use Authorization (EUA). This EUA will remain  in effect (meaning this test can be used) for the duration of the COVID-19 declaration under Section 564(b)(1) of the Act, 21 U.S.C. section 360bbb-3(b)(1), unless the authorization is terminated or revoked sooner.     Influenza A by PCR NEGATIVE NEGATIVE Final   Influenza B by PCR NEGATIVE NEGATIVE Final    Comment: (NOTE) The Xpert Xpress SARS-CoV-2/FLU/RSV assay is intended as an aid in  the  diagnosis of influenza from Nasopharyngeal swab specimens and  should not be used as a sole basis for treatment. Nasal washings and  aspirates are unacceptable for Xpert Xpress SARS-CoV-2/FLU/RSV  testing.  Fact Sheet for Patients: https://www.moore.com/  Fact Sheet for Healthcare Providers: https://www.young.biz/  This test is not yet approved or cleared by the [place] FDA and  has been authorized for detection and/or diagnosis of SARS-CoV-2 by  FDA under an Emergency Use Authorization (EUA). This EUA will remain  in effect (meaning this test can be used) for the duration of the  Covid-19 declaration under Section 564(b)(1) of the Act, 21  U.S.C. section 360bbb-3(b)(1), unless the authorization is  terminated or revoked. Performed at [facility], [address] [place], [place] [address]       Radiology Studies: No results  found.   Scheduled Meds: . aspirin EC  81 mg Oral Daily  . azithromycin  500 mg Oral Daily  . enoxaparin (LOVENOX) injection  0.5 mg/kg Subcutaneous Q24H  . hydrochlorothiazide  12.5 mg Oral Daily  . insulin aspart  0-20 Units Subcutaneous TID WC  . insulin aspart  0-5 Units Subcutaneous QHS  . ipratropium-albuterol  3 mL Nebulization QID  . isosorbide mononitrate  30 mg Oral Daily  . lisinopril  20 mg Oral BID  . [START ON 06/23/2020] metoprolol tartrate  25 mg Oral BID  . predniSONE  40 mg Oral Q breakfast   Continuous Infusions:   LOS: 2 days     [name], MD [facility] If 7PM-7AM, please contact night-coverage 06/22/2020, 5:44 PM

## 2020-06-22 NOTE — Plan of Care (Signed)
  Problem: Health Behavior/Discharge Planning: Goal: Ability to manage health-related needs will improve Outcome: Progressing   Problem: Clinical Measurements: Goal: Diagnostic test results will improve Outcome: Progressing Goal: Respiratory complications will improve Outcome: Progressing Goal: Cardiovascular complication will be avoided Outcome: Progressing   Problem: Activity: Goal: Risk for activity intolerance will decrease Outcome: Progressing   

## 2020-06-22 NOTE — Progress Notes (Signed)
Nutrition Brief Note  RD received consult for assessment of nutrition requirements/status per COPD protocol.  Wt Readings from Last 15 Encounters:  06/20/20 114.3 kg  02/27/18 127 kg  09/25/16 14.13 kg   64 year old male with PMHx of DM, COPD, CHF, HTN admitted with acute exacerbation of COPD.  Met with patient at bedside. He reports his appetite is good now and at baseline. He reports eating 3 meals daily at home. He is eating 100% of his meals here. Patient reports he is weight-stable and denies any unintentional weight loss. He reports many years ago he used to weigh close to 300 lbs and he has slowly lost weight over time. Patient is currently 114.3 kg (252 lbs). Completed Nutrition-Focused Physical Exam and only found mild muscle depletion of dorsal hand region. Patient does not meet criteria for malnutrition at this time. Patient denies any educational needs at this time for DM or CHF. He reports they are well-controlled. No further nutrition needs identified at this time.  Body mass index is 33.25 kg/m. Patient meets criteria for obesity class I based on current BMI.   Current diet order is heart healthy/carbohydrate modified, patient is consuming approximately 100% of meals at this time. Labs and medications reviewed.   No nutrition interventions warranted at this time. If nutrition issues arise, please consult RD.   Jacklynn Barnacle, MS, RD, LDN Pager number available on Amion

## 2020-06-22 NOTE — Progress Notes (Signed)
Patient has erythromycin listed as an allergy. Per md- Discussed with pt about his allergy to erythromycin.Pt said it was only swelling of his eye lids.Pt is willing to try azithromycin.

## 2020-06-23 DIAGNOSIS — J9621 Acute and chronic respiratory failure with hypoxia: Secondary | ICD-10-CM | POA: Diagnosis not present

## 2020-06-23 DIAGNOSIS — J9622 Acute and chronic respiratory failure with hypercapnia: Secondary | ICD-10-CM | POA: Diagnosis not present

## 2020-06-23 LAB — GLUCOSE, CAPILLARY
Glucose-Capillary: 79 mg/dL (ref 70–99)
Glucose-Capillary: 111 mg/dL — ABNORMAL HIGH (ref 70–99)
Glucose-Capillary: 169 mg/dL — ABNORMAL HIGH (ref 70–99)
Glucose-Capillary: 134 mg/dL — ABNORMAL HIGH (ref 70–99)

## 2020-06-23 LAB — CBC
HCT: 35.8 % — ABNORMAL LOW (ref 39.0–52.0)
Hemoglobin: 12 g/dL — ABNORMAL LOW (ref 13.0–17.0)
MCH: 30.9 pg (ref 26.0–34.0)
MCHC: 33.5 g/dL (ref 30.0–36.0)
MCV: 92.3 fL (ref 80.0–100.0)
Platelets: 218 10*3/uL (ref 150–400)
RBC: 3.88 MIL/uL — ABNORMAL LOW (ref 4.22–5.81)
RDW: 13.6 % (ref 11.5–15.5)
WBC: 9.9 10*3/uL (ref 4.0–10.5)
nRBC: 0 % (ref 0.0–0.2)

## 2020-06-23 LAB — BASIC METABOLIC PANEL
Anion gap: 9 (ref 5–15)
BUN: 28 mg/dL — ABNORMAL HIGH (ref 8–23)
CO2: 28 mmol/L (ref 22–32)
Calcium: 8.8 mg/dL — ABNORMAL LOW (ref 8.9–10.3)
Chloride: 101 mmol/L (ref 98–111)
Creatinine, Ser: 1.02 mg/dL (ref 0.61–1.24)
GFR, Estimated: >60 mL/min (ref >60)
Glucose, Bld: 110 mg/dL — ABNORMAL HIGH (ref 70–99)
Potassium: 3.9 mmol/L (ref 3.5–5.1)
Sodium: 138 mmol/L (ref 135–145)

## 2020-06-23 LAB — MAGNESIUM: Magnesium: 2.4 mg/dL (ref 1.7–2.4)

## 2020-06-23 MED ORDER — HYDROXYZINE HCL 50 MG PO TABS
50.0000 mg | ORAL_TABLET | Freq: Every evening | ORAL | Status: DC | PRN
  Filled 2020-06-23: qty 1

## 2020-06-23 MED ORDER — KETOCONAZOLE 2 % EX CREA
1.0000 "application " | TOPICAL_CREAM | Freq: Two times a day (BID) | CUTANEOUS | Status: DC
  Administered 2020-06-23 – 2020-06-24 (×3): 1 via TOPICAL
  Filled 2020-06-23: qty 15

## 2020-06-23 NOTE — Progress Notes (Signed)
PROGRESS NOTE    Cody Alexander  PXT:[id number] DOB: February 28, 1956 DOA: 06/20/2020 PCP: Mckinley Jewel, FNP    Assessment & Plan:   Principal Problem:   Acute on chronic respiratory failure with hypoxia and hypercapnia (HCC) Active Problems:   COPD with acute exacerbation (HCC)   DM type 2 with diabetic peripheral neuropathy (HCC)   Essential hypertension   Obesity, Class III, BMI 40-49.9 (morbid obesity) (HCC)   Chronic combined systolic and diastolic heart failure (HCC)   Cody Alexander is a 64 y.o. male with medical history significant of COPD, systolic dysfunction CHF, diabetes, hypertension, morbid obesity who presented to the ER with progressive shortness of breath cough congestion and fevers.  Patient has had recurrent hospitalizations due to COPD exacerbation.  He came in with oxygen sats in the 80s.  Also having significant bilateral wheezing.  He denied any sick contact.  COVID-19 screen is so far negative.  He is having generalized malaise with his symptoms.  Patient evaluated and found to have acute on chronic respiratory failure secondary to COPD exacerbation.   #1 acute on chronic respiratory failure with hypoxia  # Chronic hypoxic respiratory failure on 2L baseline --presented with oxygen sats in the 80s.  CXR showed no acute process.  Likely COPD exacerbation. PLAN: --treat COPD exacerbation --Continue supplemental O2 to keep sats between 88-92%  # COPD exacerbation --started on steroid and DuoNeb and cefepime (d/c'ed) PLAN: --continue steroid as prednisone 40 mg daily --DuoNeb QID --chest PT QID with Mucomyst neb --flutter valve --guaifenesin-codeine PRN   # diabetes:  --SSI  #4 essential hypertension:  --on home HCTZ, lisinopril, metop and Imdur --cont home regimen  #5 combined systolic and diastolic heart failure:  Appears compensated.   --on home HCTZ, lisinopril, metop and Imdur --cont home regimen  #6 morbid obesity:  Dietary  counseling.   DVT prophylaxis: Lovenox SQ Code Status: Full code  Family Communication:  Status is: inpatient Dispo:   The patient is from: home Anticipated d/c is to: home Anticipated d/c date is: 1-2 days Patient currently is not medically stable to d/c due to: acute COPD exacerbation with hypoxia.     Subjective and Interval History:  Pt reported feeling better, felt mucus starting to loosen up.  Coughing less.   Objective: Vitals:   06/23/20 0754 06/23/20 0824 06/23/20 1521 06/23/20 1539  BP: (!) 167/113 (!) 130/100  (!) 155/103  Pulse: 62   75  Resp: 18   19  Temp: 97.7 F (36.5 C)   98.3 F (36.8 C)  TempSrc: Oral   Oral  SpO2: 99%  100% 94%  Weight:      Height:       No intake or output data in the 24 hours ending 06/23/20 1922 Filed Weights   06/20/20 1449  Weight: 114.3 kg    Examination:   Constitutional: NAD, AAOx3 HEENT: conjunctivae and lids normal, EOMI CV: No cyanosis.   RESP: Still loud rhonchi, but better air movement, on 2L Extremities: No effusions, edema in BLE SKIN: warm, dry and intact Neuro: II - XII grossly intact.   Psych: Normal mood and affect.     Data Reviewed: I have personally reviewed following labs and imaging studies  CBC: Recent Labs  Lab 06/20/20 1505 06/21/20 0524 06/22/20 0430 06/23/20 0409  WBC 6.9 4.3 15.3* 9.9  NEUTROABS 5.6 3.9  --   --   HGB 14.8 13.3 12.9* 12.0*  HCT 43.9 39.9 38.9* 35.8*  MCV 91.8 91.9 93.3 92.3  PLT 230 214 231 218   Basic Metabolic Panel: Recent Labs  Lab 06/20/20 1505 06/21/20 0524 06/22/20 0430 06/23/20 0409  NA 137 137 138 138  K 3.9 4.0 4.1 3.9  CL 99 102 102 101  CO2 28 25 26 28   GLUCOSE 94 223* 144* 110*  BUN 17 17 26* 28*  CREATININE 0.90 0.81 0.81 1.02  CALCIUM 9.2 9.1 9.1 8.8*  MG  --   --  2.4 2.4   GFR: Estimated Creatinine Clearance: 98.2 mL/min (by C-G formula based on SCr of 1.02 mg/dL). Liver Function Tests: Recent Labs  Lab 06/20/20 1505  06/21/20 0524  AST 16 19  ALT 13 12  ALKPHOS 58 49  BILITOT 1.5* 0.6  PROT 7.9 7.0  ALBUMIN 4.7 3.9   No results for input(s): LIPASE, AMYLASE in the last 168 hours. No results for input(s): AMMONIA in the last 168 hours. Coagulation Profile: No results for input(s): INR, PROTIME in the last 168 hours. Cardiac Enzymes: No results for input(s): CKTOTAL, CKMB, CKMBINDEX, TROPONINI in the last 168 hours. BNP (last 3 results) No results for input(s): PROBNP in the last 8760 hours. HbA1C: Recent Labs    06/21/20 0524  HGBA1C 5.7*   CBG: Recent Labs  Lab 06/22/20 1633 06/22/20 2113 06/23/20 0752 06/23/20 1129 06/23/20 1709  GLUCAP 150* 116* 79 111* 169*   Lipid Profile: No results for input(s): CHOL, HDL, LDLCALC, TRIG, CHOLHDL, LDLDIRECT in the last 72 hours. Thyroid Function Tests: No results for input(s): TSH, T4TOTAL, FREET4, T3FREE, THYROIDAB in the last 72 hours. Anemia Panel: No results for input(s): VITAMINB12, FOLATE, FERRITIN, TIBC, IRON, RETICCTPCT in the last 72 hours. Sepsis Labs: Recent Labs  Lab 06/20/20 1505 06/20/20 1658  LATICACIDVEN 1.4 1.4    Recent Results (from the past 240 hour(s))  Respiratory Panel by RT PCR (Flu A&B, Covid) - Nasopharyngeal Swab     Status: None   Collection Time: 06/20/20  5:13 PM   Specimen: Nasopharyngeal Swab  Result Value Ref Range Status   SARS Coronavirus 2 by RT PCR NEGATIVE NEGATIVE Final    Comment: (NOTE) SARS-CoV-2 target nucleic acids are NOT DETECTED.  The SARS-CoV-2 RNA is generally detectable in upper respiratoy specimens during the acute phase of infection. The lowest concentration of SARS-CoV-2 viral copies this assay can detect is 131 copies/mL. A negative result does not preclude SARS-Cov-2 infection and should not be used as the sole basis for treatment or other patient management decisions. A negative result may occur with  improper specimen collection/handling, submission of specimen other than  nasopharyngeal swab, presence of viral mutation(s) within the areas targeted by this assay, and inadequate number of viral copies (<131 copies/mL). A negative result must be combined with clinical observations, patient history, and epidemiological information. The expected result is Negative.  Fact Sheet for Patients:  06/22/20  Fact Sheet for Healthcare Providers:  https://www.moore.com/  This test is no t yet approved or cleared by the https://www.young.biz/ FDA and  has been authorized for detection and/or diagnosis of SARS-CoV-2 by FDA under an Emergency Use Authorization (EUA). This EUA will remain  in effect (meaning this test can be used) for the duration of the COVID-19 declaration under Section 564(b)(1) of the Act, 21 U.S.C. section 360bbb-3(b)(1), unless the authorization is terminated or revoked sooner.     Influenza A by PCR NEGATIVE NEGATIVE Final   Influenza B by PCR NEGATIVE NEGATIVE Final    Comment: (NOTE) The Xpert Xpress SARS-CoV-2/FLU/RSV assay is intended  as an aid in  the diagnosis of influenza from Nasopharyngeal swab specimens and  should not be used as a sole basis for treatment. Nasal washings and  aspirates are unacceptable for Xpert Xpress SARS-CoV-2/FLU/RSV  testing.  Fact Sheet for Patients: https://www.moore.com/  Fact Sheet for Healthcare Providers: https://www.young.biz/  This test is not yet approved or cleared by the [place] FDA and  has been authorized for detection and/or diagnosis of SARS-CoV-2 by  FDA under an Emergency Use Authorization (EUA). This EUA will remain  in effect (meaning this test can be used) for the duration of the  Covid-19 declaration under Section 564(b)(1) of the Act, 21  U.S.C. section 360bbb-3(b)(1), unless the authorization is  terminated or revoked. Performed at [facility], [address] [place], [place]  [address]       Radiology Studies: No results found.   Scheduled Meds: . acetylcysteine  2 mL Nebulization QID  . aspirin EC  81 mg Oral Daily  . azithromycin  500 mg Oral Daily  . enoxaparin (LOVENOX) injection  0.5 mg/kg Subcutaneous Q24H  . hydrochlorothiazide  12.5 mg Oral Daily  . insulin aspart  0-20 Units Subcutaneous TID WC  . insulin aspart  0-5 Units Subcutaneous QHS  . ipratropium-albuterol  3 mL Nebulization QID  . isosorbide mononitrate  30 mg Oral Daily  . ketoconazole  1 application Topical BID  . lisinopril  20 mg Oral BID  . metoprolol tartrate  25 mg Oral BID  . predniSONE  40 mg Oral Q breakfast   Continuous Infusions:   LOS: 3 days     [name], MD [facility] If 7PM-7AM, please contact night-coverage 06/23/2020, 7:22 PM

## 2020-06-24 LAB — GLUCOSE, CAPILLARY
Glucose-Capillary: 96 mg/dL (ref 70–99)
Glucose-Capillary: 108 mg/dL — ABNORMAL HIGH (ref 70–99)
Glucose-Capillary: 161 mg/dL — ABNORMAL HIGH (ref 70–99)

## 2020-06-24 LAB — BASIC METABOLIC PANEL
Anion gap: 8 (ref 5–15)
BUN: 24 mg/dL — ABNORMAL HIGH (ref 8–23)
CO2: 30 mmol/L (ref 22–32)
Calcium: 8.6 mg/dL — ABNORMAL LOW (ref 8.9–10.3)
Chloride: 100 mmol/L (ref 98–111)
Creatinine, Ser: 0.97 mg/dL (ref 0.61–1.24)
GFR, Estimated: >60 mL/min (ref >60)
Glucose, Bld: 97 mg/dL (ref 70–99)
Potassium: 3.8 mmol/L (ref 3.5–5.1)
Sodium: 138 mmol/L (ref 135–145)

## 2020-06-24 LAB — CBC
HCT: 39.7 % (ref 39.0–52.0)
Hemoglobin: 13.1 g/dL (ref 13.0–17.0)
MCH: 30.5 pg (ref 26.0–34.0)
MCHC: 33 g/dL (ref 30.0–36.0)
MCV: 92.5 fL (ref 80.0–100.0)
Platelets: 227 10*3/uL (ref 150–400)
RBC: 4.29 MIL/uL (ref 4.22–5.81)
RDW: 13.4 % (ref 11.5–15.5)
WBC: 7.8 10*3/uL (ref 4.0–10.5)
nRBC: 0 % (ref 0.0–0.2)

## 2020-06-24 LAB — MAGNESIUM: Magnesium: 2.5 mg/dL — ABNORMAL HIGH (ref 1.7–2.4)

## 2020-06-24 MED ORDER — AZITHROMYCIN 500 MG PO TABS: 500.0000 mg | ORAL_TABLET | Freq: Every day | ORAL | 0 refills | Status: AC

## 2020-06-24 MED ORDER — GUAIFENESIN-CODEINE 100-10 MG/5ML PO SOLN: 10.0000 mL | Freq: Four times a day (QID) | ORAL | 0 refills | Status: DC | PRN

## 2020-06-24 NOTE — Discharge Summary (Signed)
Physician Discharge Summary   Cody Alexander  male DOB: [birth year]  JQZ:[id number]  PCP: Mckinley Jewel, FNP  Admit date: 06/20/2020 Discharge date: 06/24/2020  Admitted From: home Disposition:  home CODE STATUS: Full code  Discharge Instructions    Discharge instructions   Complete by: As directed    You have received steroid, breathing treatment, mucus clearance therapy for your COPD exacerbation.  Continue to take 2 more days of azithromycin which is for anti-inflammatory.     Dr. [name] [facility] Course:  For full details, please see H&P, progress notes, consult notes and ancillary notes.  Briefly,  Cody Robertsonis a 64 y.o.malewith medical history significant ofCOPD, systolic dysfunction CHF, diabetes, hypertension, morbid obesity who presented to the ER with progressive shortness of breath cough congestion and fevers.   Patient has had recurrent hospitalizations due to COPD exacerbation. He came in with oxygen sats in the 80s. Also having significant bilateral wheezing. He denied any sick contact. COVID-19 screen negative.   #1 acute on chronic respiratory failure with hypoxia  # Chronic hypoxic respiratory failure on 2L baseline presented with oxygen sats in the 80s.  CXR showed no acute process.  Treated for COPD exacerbation.  Continued supplemental O2 to keep sats between 88-92%.  # COPD exacerbation Pt started on steroid and DuoNeb and cefepime on presentation.  Cefepime since d/c'ed.  Pt was started on azithromycin.   Pt received chest PT QID with Mucomyst neb for mucus clearance in addition to flutter valve.  guaifenesin-codeine PRN for severe cough.  Prior to discharge, pt reported improved respiratory status and felt ready to go home.  # diabetes: Pt received SSI during hospitalization and was discharged back on home metformin.  #4 essential hypertension: Continued home HCTZ, lisinopril, metop and Imdur.  #5  combined systolic and diastolic heart failure: Appears compensated. Continued home HCTZ, lisinopril, metop and Imdur.  #6 morbid obesity: Dietary counseling.   Discharge Diagnoses:  Principal Problem:   Acute on chronic respiratory failure with hypoxia and hypercapnia (HCC) Active Problems:   COPD with acute exacerbation (HCC)   DM type 2 with diabetic peripheral neuropathy (HCC)   Essential hypertension   Obesity, Class III, BMI 40-49.9 (morbid obesity) (HCC)   Chronic combined systolic and diastolic heart failure [facility])    Discharge Instructions:  Allergies as of 06/24/2020      Reactions   Erythromycin Anaphylaxis, Swelling   Had eye swelling with erythromycin during a time when he had perf ear drum. Tolerated azithromycin.      Medication List    TAKE these medications   aspirin EC 81 MG tablet Take 81 mg by mouth daily. Swallow whole.   azithromycin 500 MG tablet Commonly known as: ZITHROMAX Take 1 tablet (500 mg total) by mouth daily for 2 days. Start taking on: June 25, 2020   cetirizine 10 MG tablet Commonly known as: ZYRTEC Take 10 mg by mouth daily.   guaiFENesin-codeine 100-10 MG/5ML syrup Take 10 mLs by mouth every 6 (six) hours as needed for cough.   hydrochlorothiazide 12.5 MG capsule Commonly known as: MICROZIDE Take 12.5 mg by mouth daily.   isosorbide mononitrate 30 MG 24 hr tablet Commonly known as: IMDUR Take 30 mg by mouth daily.   ketoconazole 2 % cream Commonly known as: NIZORAL Apply 1 application topically 2 (two) times daily.   lisinopril 20 MG tablet Commonly known as: ZESTRIL Take 20 mg by mouth 2 (two) times  daily.   metFORMIN 1000 MG tablet Commonly known as: GLUCOPHAGE Take 1,000 mg by mouth 2 (two) times daily.   metoprolol tartrate 25 MG tablet Commonly known as: LOPRESSOR Take 25 mg by mouth 2 (two) times daily.   ProAir HFA 108 (90 Base) MCG/ACT inhaler Generic drug: albuterol Inhale 2 puffs into the lungs  every 4 (four) hours as needed for wheezing.   Symbicort 160-4.5 MCG/ACT inhaler Generic drug: budesonide-formoterol Inhale 2 puffs into the lungs 2 (two) times daily.   terbinafine 250 MG tablet Commonly known as: LAMISIL Take 250 mg by mouth daily.        Follow-up Information    Mckinley Jewel, FNP. Schedule an appointment as soon as possible for a visit in 1 week(s).   Specialty: Family Medicine Contact information: [facility] [address] [place] [address] [phone]               Allergies  Allergen Reactions  . Erythromycin Anaphylaxis and Swelling    Had eye swelling with erythromycin during a time when he had perf ear drum.  Tolerated azithromycin.     The results of significant diagnostics from this hospitalization (including imaging, microbiology, ancillary and laboratory) are listed below for reference.   Consultations:   Procedures/Studies: DG Chest 2 View  Result Date: 06/20/2020 CLINICAL DATA:  Shortness of breath EXAM: CHEST - 2 VIEW COMPARISON:  Radiograph 01/12/2019 FINDINGS: Mildly coarsened interstitial opacities are similar to prior. Few bandlike opacities in the periphery of the lung bases favor atelectasis and or scarring. No new focal consolidative opacity or features of edema. No pneumothorax or visible effusion. The aorta is calcified. The remaining cardiomediastinal contours are unremarkable. No acute osseous or soft tissue abnormality. IMPRESSION: Chronic interstitial changes and bibasilar atelectasis and or scarring. No other acute cardiopulmonary disease. Electronically Signed   By: Kreg Shropshire M.D.   On: 06/20/2020 15:54      Labs: BNP (last 3 results) Recent Labs    06/20/20 1505  BNP 136.0*   Basic Metabolic Panel: Recent Labs  Lab 06/20/20 1505 06/21/20 0524 06/22/20 0430 06/23/20 0409 06/24/20 0408  NA 137 137 138 138 138  K 3.9 4.0 4.1 3.9 3.8  CL 99 102 102 101 100  CO2 28 25 26  28 30   GLUCOSE 94 223* 144* 110* 97  BUN 17 17 26* 28* 24*  CREATININE 0.90 0.81 0.81 1.02 0.97  CALCIUM 9.2 9.1 9.1 8.8* 8.6*  MG  --   --  2.4 2.4 2.5*   Liver Function Tests: Recent Labs  Lab 06/20/20 1505 06/21/20 0524  AST 16 19  ALT 13 12  ALKPHOS 58 49  BILITOT 1.5* 0.6  PROT 7.9 7.0  ALBUMIN 4.7 3.9   No results for input(s): LIPASE, AMYLASE in the last 168 hours. No results for input(s): AMMONIA in the last 168 hours. CBC: Recent Labs  Lab 06/20/20 1505 06/21/20 0524 06/22/20 0430 06/23/20 0409 06/24/20 0408  WBC 6.9 4.3 15.3* 9.9 7.8  NEUTROABS 5.6 3.9  --   --   --   HGB 14.8 13.3 12.9* 12.0* 13.1  HCT 43.9 39.9 38.9* 35.8* 39.7  MCV 91.8 91.9 93.3 92.3 92.5  PLT 230 214 231 218 227   Cardiac Enzymes: No results for input(s): CKTOTAL, CKMB, CKMBINDEX, TROPONINI in the last 168 hours. BNP: Invalid input(s): POCBNP CBG: Recent Labs  Lab 06/23/20 1129 06/23/20 1709 06/23/20 2141 06/24/20 0750 06/24/20 1135  GLUCAP 111* 169*  134* 96 108*   D-Dimer No results for input(s): DDIMER in the last 72 hours. Hgb A1c No results for input(s): HGBA1C in the last 72 hours. Lipid Profile No results for input(s): CHOL, HDL, LDLCALC, TRIG, CHOLHDL, LDLDIRECT in the last 72 hours. Thyroid function studies No results for input(s): TSH, T4TOTAL, T3FREE, THYROIDAB in the last 72 hours.  Invalid input(s): FREET3 Anemia work up No results for input(s): VITAMINB12, FOLATE, FERRITIN, TIBC, IRON, RETICCTPCT in the last 72 hours. Urinalysis    Component Value Date/Time   COLORURINE Yellow 08/20/2013 0849   APPEARANCEUR Clear 08/20/2013 0849   LABSPEC 1.014 08/20/2013 0849   PHURINE 6.0 08/20/2013 0849   GLUCOSEU Negative 08/20/2013 0849   HGBUR Negative 08/20/2013 0849   BILIRUBINUR Negative 08/20/2013 0849   KETONESUR Negative 08/20/2013 0849   PROTEINUR Negative 08/20/2013 0849   NITRITE Negative 08/20/2013 0849   LEUKOCYTESUR Negative 08/20/2013 0849    Sepsis Labs Invalid input(s): PROCALCITONIN,  WBC,  LACTICIDVEN Microbiology Recent Results (from the past 240 hour(s))  Respiratory Panel by RT PCR (Flu A&B, Covid) - Nasopharyngeal Swab     Status: None   Collection Time: 06/20/20  5:13 PM   Specimen: Nasopharyngeal Swab  Result Value Ref Range Status   SARS Coronavirus 2 by RT PCR NEGATIVE NEGATIVE Final    Comment: (NOTE) SARS-CoV-2 target nucleic acids are NOT DETECTED.  The SARS-CoV-2 RNA is generally detectable in upper respiratoy specimens during the acute phase of infection. The lowest concentration of SARS-CoV-2 viral copies this assay can detect is 131 copies/mL. A negative result does not preclude SARS-Cov-2 infection and should not be used as the sole basis for treatment or other patient management decisions. A negative result may occur with  improper specimen collection/handling, submission of specimen other than nasopharyngeal swab, presence of viral mutation(s) within the areas targeted by this assay, and inadequate number of viral copies (<131 copies/mL). A negative result must be combined with clinical observations, patient history, and epidemiological information. The expected result is Negative.  Fact Sheet for Patients:  https://www.moore.com/  Fact Sheet for Healthcare Providers:  https://www.young.biz/  This test is no t yet approved or cleared by the [place] FDA and  has been authorized for detection and/or diagnosis of SARS-CoV-2 by FDA under an Emergency Use Authorization (EUA). This EUA will remain  in effect (meaning this test can be used) for the duration of the COVID-19 declaration under Section 564(b)(1) of the Act, 21 U.S.C. section 360bbb-3(b)(1), unless the authorization is terminated or revoked sooner.     Influenza A by PCR NEGATIVE NEGATIVE Final   Influenza B by PCR NEGATIVE NEGATIVE Final    Comment: (NOTE) The Xpert Xpress  SARS-CoV-2/FLU/RSV assay is intended as an aid in  the diagnosis of influenza from Nasopharyngeal swab specimens and  should not be used as a sole basis for treatment. Nasal washings and  aspirates are unacceptable for Xpert Xpress SARS-CoV-2/FLU/RSV  testing.  Fact Sheet for Patients: https://www.moore.com/  Fact Sheet for Healthcare Providers: https://www.young.biz/  This test is not yet approved or cleared by the [place] FDA and  has been authorized for detection and/or diagnosis of SARS-CoV-2 by  FDA under an Emergency Use Authorization (EUA). This EUA will remain  in effect (meaning this test can be used) for the duration of the  Covid-19 declaration under Section 564(b)(1) of the Act, 21  U.S.C. section 360bbb-3(b)(1), unless the authorization is  terminated or revoked. Performed at [facility], [address]  [place], [place] [id number]      Total time spend on discharging this patient, including the last patient exam, discussing the hospital stay, instructions for ongoing care as it relates to all pertinent caregivers, as well as preparing the medical discharge records, prescriptions, and/or referrals as applicable, is 45 minutes.    Darlin Priestlyina Durante Violett, MD  [facility] 06/24/2020, 1:01 PM  If 7PM-7AM, please contact night-coverage

## 2020-06-24 NOTE — Progress Notes (Addendum)
Discharge Note: Reviewed discharge instructions with pt, Pt verbalized understanding. Retrieved home meds from pharmacy. Pt d/c to home with home meds.  Obtained vitals. IV cath removed and is intact. Staff wheeled pt out. Pt transported to home via private vehicle.

## 2021-01-04 ENCOUNTER — Other Ambulatory Visit: Payer: Self-pay

## 2021-01-04 ENCOUNTER — Emergency Department: Payer: Medicaid Other

## 2021-01-04 ENCOUNTER — Inpatient Hospital Stay
Admit: 2021-01-04 | Discharge: 2021-01-04 | Disposition: A | Discharge status: Home / Self Care | Payer: Medicaid Other | Attending: Internal Medicine | Admitting: Internal Medicine

## 2021-01-04 ENCOUNTER — Inpatient Hospital Stay
Admission: EM | Admit: 2021-01-04 | Discharge: 2021-01-15 | DRG: 280 | Disposition: A | Discharge status: Home Health | Payer: Medicaid Other | Attending: Internal Medicine | Admitting: Internal Medicine

## 2021-01-04 DIAGNOSIS — Z7984 Long term (current) use of oral hypoglycemic drugs: Secondary | ICD-10-CM | POA: Diagnosis not present

## 2021-01-04 DIAGNOSIS — E785 Hyperlipidemia, unspecified: Secondary | ICD-10-CM | POA: Diagnosis present

## 2021-01-04 DIAGNOSIS — R778 Other specified abnormalities of plasma proteins: Secondary | ICD-10-CM | POA: Diagnosis not present

## 2021-01-04 DIAGNOSIS — M940 Chondrocostal junction syndrome [Tietze]: Secondary | ICD-10-CM | POA: Diagnosis not present

## 2021-01-04 DIAGNOSIS — I214 Non-ST elevation (NSTEMI) myocardial infarction: Secondary | ICD-10-CM | POA: Diagnosis not present

## 2021-01-04 DIAGNOSIS — Z79899 Other long term (current) drug therapy: Secondary | ICD-10-CM

## 2021-01-04 DIAGNOSIS — N390 Urinary tract infection, site not specified: Secondary | ICD-10-CM | POA: Diagnosis present

## 2021-01-04 DIAGNOSIS — E669 Obesity, unspecified: Secondary | ICD-10-CM

## 2021-01-04 DIAGNOSIS — J432 Centrilobular emphysema: Secondary | ICD-10-CM | POA: Diagnosis present

## 2021-01-04 DIAGNOSIS — I252 Old myocardial infarction: Secondary | ICD-10-CM

## 2021-01-04 DIAGNOSIS — R1030 Lower abdominal pain, unspecified: Secondary | ICD-10-CM

## 2021-01-04 DIAGNOSIS — I10 Essential (primary) hypertension: Secondary | ICD-10-CM | POA: Diagnosis present

## 2021-01-04 DIAGNOSIS — I110 Hypertensive heart disease with heart failure: Secondary | ICD-10-CM | POA: Diagnosis present

## 2021-01-04 DIAGNOSIS — G4733 Obstructive sleep apnea (adult) (pediatric): Secondary | ICD-10-CM | POA: Diagnosis present

## 2021-01-04 DIAGNOSIS — R3911 Hesitancy of micturition: Secondary | ICD-10-CM | POA: Diagnosis not present

## 2021-01-04 DIAGNOSIS — J441 Chronic obstructive pulmonary disease with (acute) exacerbation: Secondary | ICD-10-CM | POA: Diagnosis not present

## 2021-01-04 DIAGNOSIS — J9621 Acute and chronic respiratory failure with hypoxia: Secondary | ICD-10-CM | POA: Diagnosis present

## 2021-01-04 DIAGNOSIS — N400 Enlarged prostate without lower urinary tract symptoms: Secondary | ICD-10-CM | POA: Diagnosis present

## 2021-01-04 DIAGNOSIS — Z87891 Personal history of nicotine dependence: Secondary | ICD-10-CM

## 2021-01-04 DIAGNOSIS — I2510 Atherosclerotic heart disease of native coronary artery without angina pectoris: Secondary | ICD-10-CM | POA: Diagnosis present

## 2021-01-04 DIAGNOSIS — E1142 Type 2 diabetes mellitus with diabetic polyneuropathy: Secondary | ICD-10-CM | POA: Diagnosis present

## 2021-01-04 DIAGNOSIS — R319 Hematuria, unspecified: Secondary | ICD-10-CM | POA: Diagnosis not present

## 2021-01-04 DIAGNOSIS — N179 Acute kidney failure, unspecified: Secondary | ICD-10-CM | POA: Diagnosis present

## 2021-01-04 DIAGNOSIS — Z20822 Contact with and (suspected) exposure to covid-19: Secondary | ICD-10-CM | POA: Diagnosis present

## 2021-01-04 DIAGNOSIS — Z9981 Dependence on supplemental oxygen: Secondary | ICD-10-CM

## 2021-01-04 DIAGNOSIS — J9611 Chronic respiratory failure with hypoxia: Secondary | ICD-10-CM | POA: Diagnosis not present

## 2021-01-04 DIAGNOSIS — Z7951 Long term (current) use of inhaled steroids: Secondary | ICD-10-CM

## 2021-01-04 DIAGNOSIS — E1169 Type 2 diabetes mellitus with other specified complication: Secondary | ICD-10-CM | POA: Diagnosis present

## 2021-01-04 DIAGNOSIS — G8929 Other chronic pain: Secondary | ICD-10-CM | POA: Diagnosis present

## 2021-01-04 DIAGNOSIS — Z7982 Long term (current) use of aspirin: Secondary | ICD-10-CM

## 2021-01-04 DIAGNOSIS — I5031 Acute diastolic (congestive) heart failure: Secondary | ICD-10-CM | POA: Insufficient documentation

## 2021-01-04 DIAGNOSIS — N401 Enlarged prostate with lower urinary tract symptoms: Secondary | ICD-10-CM

## 2021-01-04 DIAGNOSIS — I5033 Acute on chronic diastolic (congestive) heart failure: Secondary | ICD-10-CM

## 2021-01-04 DIAGNOSIS — I509 Heart failure, unspecified: Secondary | ICD-10-CM | POA: Diagnosis not present

## 2021-01-04 DIAGNOSIS — E1165 Type 2 diabetes mellitus with hyperglycemia: Secondary | ICD-10-CM

## 2021-01-04 DIAGNOSIS — R739 Hyperglycemia, unspecified: Secondary | ICD-10-CM

## 2021-01-04 DIAGNOSIS — R0602 Shortness of breath: Secondary | ICD-10-CM | POA: Diagnosis present

## 2021-01-04 DIAGNOSIS — R7989 Other specified abnormal findings of blood chemistry: Secondary | ICD-10-CM

## 2021-01-04 DIAGNOSIS — I21A1 Myocardial infarction type 2: Secondary | ICD-10-CM | POA: Diagnosis present

## 2021-01-04 DIAGNOSIS — J449 Chronic obstructive pulmonary disease, unspecified: Secondary | ICD-10-CM

## 2021-01-04 DIAGNOSIS — R21 Rash and other nonspecific skin eruption: Secondary | ICD-10-CM

## 2021-01-04 DIAGNOSIS — I5032 Chronic diastolic (congestive) heart failure: Secondary | ICD-10-CM

## 2021-01-04 DIAGNOSIS — Z6839 Body mass index (BMI) 39.0-39.9, adult: Secondary | ICD-10-CM

## 2021-01-04 DIAGNOSIS — R0600 Dyspnea, unspecified: Secondary | ICD-10-CM

## 2021-01-04 DIAGNOSIS — Z881 Allergy status to other antibiotic agents status: Secondary | ICD-10-CM

## 2021-01-04 HISTORY — DX: Polyneuropathy, unspecified: G62.9

## 2021-01-04 LAB — CBC WITH DIFFERENTIAL/PLATELET
Abs Immature Granulocytes: 0.05 10*3/uL (ref 0.00–0.07)
Basophils Absolute: 0.1 10*3/uL (ref 0.0–0.1)
Basophils Relative: 1 %
Eosinophils Absolute: 0.3 10*3/uL (ref 0.0–0.5)
Eosinophils Relative: 3 %
HCT: 42.1 % (ref 39.0–52.0)
Hemoglobin: 13.5 g/dL (ref 13.0–17.0)
Immature Granulocytes: 0 %
Lymphocytes Relative: 15 %
Lymphs Abs: 1.9 10*3/uL (ref 0.7–4.0)
MCH: 29.7 pg (ref 26.0–34.0)
MCHC: 32.1 g/dL (ref 30.0–36.0)
MCV: 92.7 fL (ref 80.0–100.0)
Monocytes Absolute: 1.1 10*3/uL — ABNORMAL HIGH (ref 0.1–1.0)
Monocytes Relative: 8 %
Neutro Abs: 9.3 10*3/uL — ABNORMAL HIGH (ref 1.7–7.7)
Neutrophils Relative %: 73 %
Platelets: 233 10*3/uL (ref 150–400)
RBC: 4.54 MIL/uL (ref 4.22–5.81)
RDW: 13.2 % (ref 11.5–15.5)
WBC: 12.7 10*3/uL — ABNORMAL HIGH (ref 4.0–10.5)
nRBC: 0 % (ref 0.0–0.2)

## 2021-01-04 LAB — GLUCOSE, CAPILLARY
Glucose-Capillary: 462 mg/dL — ABNORMAL HIGH (ref 70–99)
Glucose-Capillary: 311 mg/dL — ABNORMAL HIGH (ref 70–99)

## 2021-01-04 LAB — LIPID PANEL
Cholesterol: 173 mg/dL (ref 0–200)
HDL: 54 mg/dL (ref >40)
LDL Cholesterol: 103 mg/dL — ABNORMAL HIGH (ref 0–99)
Total CHOL/HDL Ratio: 3.2 RATIO
Triglycerides: 82 mg/dL (ref <150)
VLDL: 16 mg/dL (ref 0–40)

## 2021-01-04 LAB — COMPREHENSIVE METABOLIC PANEL
ALT: 18 U/L (ref 0–44)
AST: 17 U/L (ref 15–41)
Albumin: 3.6 g/dL (ref 3.5–5.0)
Alkaline Phosphatase: 56 U/L (ref 38–126)
Anion gap: 8 (ref 5–15)
BUN: 18 mg/dL (ref 8–23)
CO2: 33 mmol/L — ABNORMAL HIGH (ref 22–32)
Calcium: 8.8 mg/dL — ABNORMAL LOW (ref 8.9–10.3)
Chloride: 95 mmol/L — ABNORMAL LOW (ref 98–111)
Creatinine, Ser: 1.29 mg/dL — ABNORMAL HIGH (ref 0.61–1.24)
GFR, Estimated: >60 mL/min (ref >60)
Glucose, Bld: 411 mg/dL — ABNORMAL HIGH (ref 70–99)
Potassium: 4.6 mmol/L (ref 3.5–5.1)
Sodium: 136 mmol/L (ref 135–145)
Total Bilirubin: 0.8 mg/dL (ref 0.3–1.2)
Total Protein: 6.4 g/dL — ABNORMAL LOW (ref 6.5–8.1)

## 2021-01-04 LAB — ECHOCARDIOGRAM COMPLETE
Height: 73 in
S' Lateral: 2.82 cm
Weight: 4800 oz

## 2021-01-04 LAB — RESP PANEL BY RT-PCR (FLU A&B, COVID) ARPGX2
Influenza A by PCR: NEGATIVE
Influenza B by PCR: NEGATIVE
SARS Coronavirus 2 by RT PCR: NEGATIVE

## 2021-01-04 LAB — APTT: aPTT: 26 seconds (ref 24–36)

## 2021-01-04 LAB — CBG MONITORING, ED
Glucose-Capillary: 374 mg/dL — ABNORMAL HIGH (ref 70–99)
Glucose-Capillary: 496 mg/dL — ABNORMAL HIGH (ref 70–99)
Glucose-Capillary: 491 mg/dL — ABNORMAL HIGH (ref 70–99)
Glucose-Capillary: 480 mg/dL — ABNORMAL HIGH (ref 70–99)

## 2021-01-04 LAB — HEPARIN LEVEL (UNFRACTIONATED)
Heparin Unfractionated: 0.2 IU/mL — ABNORMAL LOW (ref 0.30–0.70)
Heparin Unfractionated: 0.34 IU/mL (ref 0.30–0.70)
Heparin Unfractionated: 0.35 IU/mL (ref 0.30–0.70)

## 2021-01-04 LAB — TROPONIN I (HIGH SENSITIVITY)
Troponin I (High Sensitivity): 266 ng/L (ref <18)
Troponin I (High Sensitivity): 301 ng/L (ref <18)

## 2021-01-04 LAB — BRAIN NATRIURETIC PEPTIDE: B Natriuretic Peptide: 93.2 pg/mL (ref 0.0–100.0)

## 2021-01-04 LAB — HEMOGLOBIN A1C
Hgb A1c MFr Bld: 10.1 % — ABNORMAL HIGH (ref 4.8–5.6)
Mean Plasma Glucose: 243.17 mg/dL

## 2021-01-04 LAB — PROTIME-INR
INR: 0.9 (ref 0.8–1.2)
Prothrombin Time: 12.6 seconds (ref 11.4–15.2)

## 2021-01-04 MED ORDER — INSULIN ASPART 100 UNIT/ML IJ SOLN
0.0000 [IU] | Freq: Three times a day (TID) | INTRAMUSCULAR | Status: DC
  Administered 2021-01-04 (×2): 20 [IU] via SUBCUTANEOUS
  Administered 2021-01-05 (×2): 7 [IU] via SUBCUTANEOUS
  Administered 2021-01-05 – 2021-01-06 (×2): 3 [IU] via SUBCUTANEOUS
  Administered 2021-01-06: 7 [IU] via SUBCUTANEOUS
  Administered 2021-01-06: 11 [IU] via SUBCUTANEOUS
  Administered 2021-01-07 (×2): 7 [IU] via SUBCUTANEOUS
  Administered 2021-01-07: 3 [IU] via SUBCUTANEOUS
  Administered 2021-01-08 (×3): 4 [IU] via SUBCUTANEOUS
  Administered 2021-01-09: 3 [IU] via SUBCUTANEOUS
  Administered 2021-01-09: 7 [IU] via SUBCUTANEOUS
  Administered 2021-01-09: 4 [IU] via SUBCUTANEOUS
  Administered 2021-01-10: 11 [IU] via SUBCUTANEOUS
  Administered 2021-01-10: 3 [IU] via SUBCUTANEOUS
  Administered 2021-01-10: 7 [IU] via SUBCUTANEOUS
  Administered 2021-01-11: 11 [IU] via SUBCUTANEOUS
  Administered 2021-01-11 (×2): 4 [IU] via SUBCUTANEOUS
  Administered 2021-01-12: 15 [IU] via SUBCUTANEOUS
  Administered 2021-01-12: 4 [IU] via SUBCUTANEOUS
  Administered 2021-01-13: 7 [IU] via SUBCUTANEOUS
  Filled 2021-01-04 (×25): qty 1

## 2021-01-04 MED ORDER — DOXYCYCLINE HYCLATE 100 MG PO TABS
100.0000 mg | ORAL_TABLET | Freq: Two times a day (BID) | ORAL | Status: AC
  Administered 2021-01-04 – 2021-01-08 (×10): 100 mg via ORAL
  Filled 2021-01-04 (×10): qty 1

## 2021-01-04 MED ORDER — NITROGLYCERIN 2 % TD OINT
1.0000 [in_us] | TOPICAL_OINTMENT | Freq: Once | TRANSDERMAL | Status: AC
  Administered 2021-01-04: 1 [in_us] via TOPICAL
  Filled 2021-01-04: qty 1

## 2021-01-04 MED ORDER — ACETAMINOPHEN 325 MG PO TABS
650.0000 mg | ORAL_TABLET | ORAL | Status: DC | PRN
  Administered 2021-01-04 – 2021-01-15 (×3): 650 mg via ORAL
  Filled 2021-01-04 (×3): qty 2

## 2021-01-04 MED ORDER — FUROSEMIDE 10 MG/ML IJ SOLN
40.0000 mg | Freq: Once | INTRAMUSCULAR | Status: AC
  Administered 2021-01-04: 40 mg via INTRAVENOUS
  Filled 2021-01-04: qty 4

## 2021-01-04 MED ORDER — HYDROCOD POLST-CPM POLST ER 10-8 MG/5ML PO SUER
5.0000 mL | Freq: Two times a day (BID) | ORAL | Status: DC | PRN
  Administered 2021-01-04 – 2021-01-07 (×5): 5 mL via ORAL
  Filled 2021-01-04 (×5): qty 5

## 2021-01-04 MED ORDER — HEPARIN (PORCINE) 25000 UT/250ML-% IV SOLN
1750.0000 [IU]/h | INTRAVENOUS | Status: DC
  Administered 2021-01-04: 1750 [IU]/h via INTRAVENOUS
  Administered 2021-01-04: 1500 [IU]/h via INTRAVENOUS
  Administered 2021-01-05: 1750 [IU]/h via INTRAVENOUS
  Filled 2021-01-04 (×3): qty 250

## 2021-01-04 MED ORDER — HEPARIN BOLUS VIA INFUSION
1500.0000 [IU] | Freq: Once | INTRAVENOUS | Status: AC
  Administered 2021-01-04: 1500 [IU] via INTRAVENOUS
  Filled 2021-01-04: qty 1500

## 2021-01-04 MED ORDER — HEPARIN BOLUS VIA INFUSION
4000.0000 [IU] | Freq: Once | INTRAVENOUS | Status: AC
  Administered 2021-01-04: 4000 [IU] via INTRAVENOUS
  Filled 2021-01-04: qty 4000

## 2021-01-04 MED ORDER — IPRATROPIUM-ALBUTEROL 0.5-2.5 (3) MG/3ML IN SOLN
3.0000 mL | Freq: Three times a day (TID) | RESPIRATORY_TRACT | Status: DC
  Administered 2021-01-04: 3 mL via RESPIRATORY_TRACT
  Filled 2021-01-04: qty 3

## 2021-01-04 MED ORDER — NITROGLYCERIN 0.4 MG SL SUBL: 0.4000 mg | SUBLINGUAL_TABLET | SUBLINGUAL | Status: DC | PRN

## 2021-01-04 MED ORDER — ASPIRIN EC 81 MG PO TBEC
81.0000 mg | DELAYED_RELEASE_TABLET | Freq: Every day | ORAL | Status: DC
  Administered 2021-01-04 – 2021-01-15 (×12): 81 mg via ORAL
  Filled 2021-01-04 (×12): qty 1

## 2021-01-04 MED ORDER — HEPARIN (PORCINE) 25000 UT/250ML-% IV SOLN: 14.0000 [IU]/kg/h | INTRAVENOUS | Status: DC

## 2021-01-04 MED ORDER — ACETYLCYSTEINE 20 % IN SOLN
3.0000 mL | Freq: Two times a day (BID) | RESPIRATORY_TRACT | Status: DC
  Administered 2021-01-04 – 2021-01-05 (×2): 3 mL via RESPIRATORY_TRACT
  Administered 2021-01-05: 4 mL via RESPIRATORY_TRACT
  Administered 2021-01-06 – 2021-01-10 (×10): 3 mL via RESPIRATORY_TRACT
  Filled 2021-01-04 (×14): qty 4

## 2021-01-04 MED ORDER — BUDESONIDE 0.5 MG/2ML IN SUSP
0.5000 mg | Freq: Two times a day (BID) | RESPIRATORY_TRACT | Status: DC
  Administered 2021-01-04 – 2021-01-11 (×15): 0.5 mg via RESPIRATORY_TRACT
  Filled 2021-01-04 (×15): qty 2

## 2021-01-04 MED ORDER — IPRATROPIUM-ALBUTEROL 0.5-2.5 (3) MG/3ML IN SOLN
3.0000 mL | Freq: Three times a day (TID) | RESPIRATORY_TRACT | Status: DC
  Administered 2021-01-04 – 2021-01-10 (×17): 3 mL via RESPIRATORY_TRACT
  Filled 2021-01-04 (×18): qty 3

## 2021-01-04 MED ORDER — ASPIRIN 81 MG PO CHEW
324.0000 mg | CHEWABLE_TABLET | Freq: Once | ORAL | Status: AC
  Administered 2021-01-04: 324 mg via ORAL
  Filled 2021-01-04: qty 4

## 2021-01-04 MED ORDER — HEPARIN SODIUM (PORCINE) 5000 UNIT/ML IJ SOLN: 4000.0000 [IU] | Freq: Once | INTRAMUSCULAR | Status: DC

## 2021-01-04 MED ORDER — INSULIN GLARGINE 100 UNIT/ML ~~LOC~~ SOLN
15.0000 [IU] | Freq: Every day | SUBCUTANEOUS | Status: DC
  Administered 2021-01-04: 15 [IU] via SUBCUTANEOUS
  Filled 2021-01-04 (×2): qty 0.15

## 2021-01-04 MED ORDER — METOPROLOL TARTRATE 25 MG PO TABS
25.0000 mg | ORAL_TABLET | Freq: Two times a day (BID) | ORAL | Status: DC
  Administered 2021-01-04 – 2021-01-06 (×5): 25 mg via ORAL
  Filled 2021-01-04 (×5): qty 1

## 2021-01-04 MED ORDER — INSULIN ASPART 100 UNIT/ML IJ SOLN
28.0000 [IU] | INTRAMUSCULAR | Status: AC
  Administered 2021-01-04: 28 [IU] via SUBCUTANEOUS
  Filled 2021-01-04: qty 1

## 2021-01-04 MED ORDER — LISINOPRIL 20 MG PO TABS
20.0000 mg | ORAL_TABLET | Freq: Two times a day (BID) | ORAL | Status: DC
  Administered 2021-01-04 – 2021-01-09 (×11): 20 mg via ORAL
  Filled 2021-01-04 (×7): qty 1
  Filled 2021-01-04: qty 2
  Filled 2021-01-04 (×3): qty 1

## 2021-01-04 MED ORDER — INSULIN ASPART 100 UNIT/ML IJ SOLN
0.0000 [IU] | Freq: Every day | INTRAMUSCULAR | Status: DC
  Administered 2021-01-04: 4 [IU] via SUBCUTANEOUS
  Administered 2021-01-06 – 2021-01-10 (×5): 2 [IU] via SUBCUTANEOUS
  Administered 2021-01-11: 3 [IU] via SUBCUTANEOUS
  Administered 2021-01-12: 2 [IU] via SUBCUTANEOUS
  Administered 2021-01-13 – 2021-01-14 (×2): 3 [IU] via SUBCUTANEOUS
  Filled 2021-01-04 (×10): qty 1

## 2021-01-04 MED ORDER — INSULIN ASPART 100 UNIT/ML IJ SOLN
4.0000 [IU] | Freq: Three times a day (TID) | INTRAMUSCULAR | Status: DC
  Administered 2021-01-04 – 2021-01-09 (×16): 4 [IU] via SUBCUTANEOUS
  Filled 2021-01-04 (×15): qty 1

## 2021-01-04 MED ORDER — ATORVASTATIN CALCIUM 20 MG PO TABS
40.0000 mg | ORAL_TABLET | Freq: Every day | ORAL | Status: DC
  Administered 2021-01-04 – 2021-01-15 (×12): 40 mg via ORAL
  Filled 2021-01-04 (×12): qty 2

## 2021-01-04 MED ORDER — TRIAMCINOLONE ACETONIDE 0.1 % EX CREA
TOPICAL_CREAM | Freq: Three times a day (TID) | CUTANEOUS | Status: DC
  Administered 2021-01-06: 1 via TOPICAL
  Filled 2021-01-04 (×2): qty 15

## 2021-01-04 MED ORDER — FUROSEMIDE 10 MG/ML IJ SOLN
60.0000 mg | Freq: Two times a day (BID) | INTRAMUSCULAR | Status: DC
  Administered 2021-01-04 – 2021-01-05 (×2): 60 mg via INTRAVENOUS
  Filled 2021-01-04: qty 6
  Filled 2021-01-04: qty 8
  Filled 2021-01-04: qty 6

## 2021-01-04 MED ORDER — INSULIN ASPART 100 UNIT/ML IJ SOLN
8.0000 [IU] | Freq: Once | INTRAMUSCULAR | Status: AC
  Administered 2021-01-04: 8 [IU] via INTRAVENOUS
  Filled 2021-01-04: qty 1

## 2021-01-04 MED ORDER — NITROGLYCERIN 2 % TD OINT
1.0000 [in_us] | TOPICAL_OINTMENT | Freq: Four times a day (QID) | TRANSDERMAL | Status: DC
  Administered 2021-01-04 – 2021-01-05 (×4): 1 [in_us] via TOPICAL
  Filled 2021-01-04 (×4): qty 1

## 2021-01-04 MED ORDER — SPIRONOLACTONE 25 MG PO TABS
12.5000 mg | ORAL_TABLET | Freq: Every day | ORAL | Status: DC
  Administered 2021-01-04 – 2021-01-15 (×12): 12.5 mg via ORAL
  Filled 2021-01-04 (×3): qty 1
  Filled 2021-01-04 (×2): qty 0.5
  Filled 2021-01-04: qty 1
  Filled 2021-01-04 (×2): qty 0.5
  Filled 2021-01-04: qty 1
  Filled 2021-01-04 (×2): qty 0.5
  Filled 2021-01-04: qty 1
  Filled 2021-01-04: qty 0.5
  Filled 2021-01-04: qty 1
  Filled 2021-01-04: qty 0.5
  Filled 2021-01-04: qty 1
  Filled 2021-01-04: qty 0.5
  Filled 2021-01-04 (×3): qty 1
  Filled 2021-01-04 (×3): qty 0.5

## 2021-01-04 MED ORDER — ONDANSETRON HCL 4 MG/2ML IJ SOLN
4.0000 mg | Freq: Four times a day (QID) | INTRAMUSCULAR | Status: DC | PRN
  Filled 2021-01-04: qty 2

## 2021-01-04 MED ORDER — ACETYLCYSTEINE 20 % IN SOLN
3.0000 mL | Freq: Two times a day (BID) | RESPIRATORY_TRACT | Status: DC
  Administered 2021-01-04: 4 mL via RESPIRATORY_TRACT
  Filled 2021-01-04 (×2): qty 4

## 2021-01-04 MED ORDER — IPRATROPIUM-ALBUTEROL 0.5-2.5 (3) MG/3ML IN SOLN
3.0000 mL | RESPIRATORY_TRACT | Status: DC | PRN
  Administered 2021-01-04 (×2): 3 mL via RESPIRATORY_TRACT
  Filled 2021-01-04: qty 6

## 2021-01-04 NOTE — Progress Notes (Signed)
ANTICOAGULATION CONSULT NOTE - Initial Consult  Pharmacy Consult for Heparin  Indication: chest pain/ACS  Allergies  Allergen Reactions  . Erythromycin Anaphylaxis and Swelling    Had eye swelling with erythromycin during a time when he had perf ear drum.  Tolerated azithromycin.    Patient Measurements: Height: 6\' 1"  (185.4 cm) Weight: 136.1 kg (300 lb) (no bed scale available) IBW/kg (Calculated) : 79.9 Heparin Dosing Weight: 110.7 kg   Vital Signs: Temp: 98.6 F (37 C) (05/12 0212) Temp Source: Oral (05/12 0212) BP: 120/84 (05/12 1030) Pulse Rate: 102 (05/12 1051)  Labs: Recent Labs    01/04/21 0220 01/04/21 0359 01/04/21 1002  HGB 13.5  --   --   HCT 42.1  --   --   PLT 233  --   --   APTT 26  --   --   LABPROT 12.6  --   --   INR 0.9  --   --   HEPARINUNFRC  --   --  0.20*  CREATININE 1.29*  --   --   TROPONINIHS 266* 301*  --     Estimated Creatinine Clearance: 84.9 mL/min (A) (by C-G formula based on SCr of 1.29 mg/dL (H)).   Medical History: Past Medical History:  Diagnosis Date  . CHF (congestive heart failure) (HCC)   . COPD (chronic obstructive pulmonary disease) (HCC)   . Diabetes mellitus without complication (HCC)   . Hypertension   . Neuropathy     Medications:  (Not in a hospital admission)   Assessment: Pharmacy consulted to dose heparin in this 65 year old male admitted with ACS/NSTEMI.  CrCl = 84.9 ml/min No prior anticoag noted.    Goal of Therapy:  Heparin level 0.3-0.7 units/ml Monitor platelets by anticoagulation protocol: Yes   Plan:  5/12@1002  HL: 0.2, subtherapeutic. Will rebolus 1500 units x1 and increase infusion rate to 1750 units/hr Check anti-Xa level in 6 hours and daily while on heparin Continue to monitor H&H and platelets  Iszabella Hebenstreit A Esti Demello 01/04/2021,11:01 AM

## 2021-01-04 NOTE — ED Notes (Signed)
Pt stood up to use urinal and change out of clothes into gown, desat to 86%. Pt reports getting dizzy, sat back into bed. Pt's O2 increased to 6L at this time due to desat to increase and maintain O2

## 2021-01-04 NOTE — ED Notes (Signed)
MD hospitalist at bedside.

## 2021-01-04 NOTE — ED Notes (Signed)
Message MD. Renae Gloss regarding 496 blood sugar. Advised to give 24 units sub q

## 2021-01-04 NOTE — H&P (Signed)
History and Physical    Ruth Tully CBJ:[id number] DOB: 15-Feb-1956 DOA: 01/04/2021  PCP: Mckinley Jewel, FNP   Patient coming from: Home  I have personally briefly reviewed patient's old medical records in [facility]  Chief Complaint: Shortness of breath  HPI: Cody Alexander is a 65 y.o. male with medical history significant for COPD on home O2 at 2 L, obesity, OSA, HTN, mild CAD on cardiac cath 02/2018, diastolic heart failure, last EF 55% November 2021, hospitalized in February in [place], for CHF exacerbation with NSTEMI, who presents by EMS with shortness of breath and wheezing that started the day prior.  EMS presented to the home earlier and administered breathing treatments but patient refused transportation to the hospital.  He started to feel worse and had a near passing out episode and called EMS again who brought him to the hospital.  He admits to not having taken his medication in a few days as he was on antibiotics for UTI and states he was not sure how it would interact with his regular medication.  He has bilateral lower extremity edema, orthopnea which is chronic for him and did have some chest tightness earlier which she said has subsided.  He denies nausea or vomiting or diaphoresis. has a congested nonproductive cough but denies fever or chills.  Denies abdominal pain,  or change in bowel habits.  Patient received duo nebs and Solu-Medrol in route to the hospital ED course: On arrival, tachypneic at 24 with O2 sat 97% and O2 at flow rate of 3 L.  Tachycardic at 101, BP 160/96.  Blood work significant for leukocytosis of 12,700.  BNP 93.2.  Troponin 266.  Creatinine 1.29 up from 0.81 six months prior.  COVID and flu negative. EKG: Sinus tachycardia at 101 with nonspecific ST-T wave changes Imaging: Chest x-ray: Cardiomegaly with mild central vascular congestion no focal consolidation.  Patient was treated with IV Lasix and started on a heparin infusion.  Also  received a small dose of IV insulin.  Hospitalist consulted for admission.  Review of Systems: As per HPI otherwise all other systems on review of systems negative.    Past Medical History:  Diagnosis Date  . CHF (congestive heart failure) (HCC)   . COPD (chronic obstructive pulmonary disease) (HCC)   . Diabetes mellitus without complication (HCC)   . Hypertension   . Neuropathy     Past Surgical History:  Procedure Laterality Date  . LEFT HEART CATH AND CORONARY ANGIOGRAPHY Right 02/27/2018   Procedure: LEFT HEART CATH AND CORONARY ANGIOGRAPHY;  Surgeon: Laurier Nancy, MD;  Location: [facility] INVASIVE CV LAB;  Service: Cardiovascular;  Laterality: Right;     reports that he has never smoked. He has never used smokeless tobacco. He reports that he does not drink alcohol and does not use drugs.  Allergies  Allergen Reactions  . Erythromycin Anaphylaxis and Swelling    Had eye swelling with erythromycin during a time when he had perf ear drum.  Tolerated azithromycin.    History reviewed. No pertinent family history.    Prior to Admission medications   Medication Sig Start Date End Date Taking? Authorizing Provider  acetaminophen (TYLENOL) 500 MG tablet Take 1,000 mg by mouth every 6 (six) hours as needed. 10/21/20  Yes [provider]  amLODipine (NORVASC) 10 MG tablet Take 1 tablet by mouth daily. 11/14/20  Yes [provider]  aspirin EC 81 MG tablet Take 81 mg by mouth daily. Swallow whole.  Yes [provider]  atorvastatin (LIPITOR) 40 MG tablet Take 1 tablet by mouth daily as needed. 11/14/20  Yes [provider]  cefdinir (OMNICEF) 300 MG capsule Take 300 mg by mouth 2 (two) times daily. 12/31/20  Yes [provider]  furosemide (LASIX) 80 MG tablet Take 80 mg by mouth daily. 11/14/20  Yes [provider]  ipratropium (ATROVENT HFA) 17 MCG/ACT inhaler Inhale 1 puff into the lungs every 6 (six) hours as needed. 02/01/19  Yes  [provider]  ipratropium-albuterol (DUONEB) 0.5-2.5 (3) MG/3ML SOLN Inhale 3 mLs into the lungs every 4 (four) hours as needed. 11/10/18  Yes [provider]  isosorbide dinitrate (ISORDIL) 30 MG tablet Take 30 mg by mouth every morning. 11/14/20  Yes [provider]  ketoconazole (NIZORAL) 2 % cream Apply 1 application topically 2 (two) times daily. 06/02/20  Yes [provider]  lisinopril (ZESTRIL) 20 MG tablet Take 20 mg by mouth 2 (two) times daily.   Yes [provider]  metFORMIN (GLUCOPHAGE) 1000 MG tablet Take 1,000 mg by mouth 2 (two) times daily. 06/02/20  Yes [provider]  metoprolol tartrate (LOPRESSOR) 25 MG tablet Take 25 mg by mouth 2 (two) times daily.   Yes [provider]  PROAIR HFA 108 (90 Base) MCG/ACT inhaler Inhale 2 puffs into the lungs every 4 (four) hours as needed for wheezing. 06/02/20  Yes [provider]  SYMBICORT 160-4.5 MCG/ACT inhaler Inhale 2 puffs into the lungs 2 (two) times daily. 06/02/20  Yes [provider]  triamcinolone (KENALOG) 0.025 % cream Apply 1 application topically 2 (two) times daily. 10/31/20  Yes [provider]    Physical Exam: Vitals:   01/04/21 0212 01/04/21 0216 01/04/21 0218 01/04/21 0225  BP:   (!) 160/96   Pulse: (!) 101   95  Resp: (!) 24     Temp: 98.6 F (37 C)     TempSrc: Oral     SpO2: 97%     Weight:  136.1 kg    Height:  6\' 1"  (1.854 m)       Vitals:   01/04/21 0212 01/04/21 0216 01/04/21 0218 01/04/21 0225  BP:   (!) 160/96   Pulse: (!) 101   95  Resp: (!) 24     Temp: 98.6 F (37 C)     TempSrc: Oral     SpO2: 97%     Weight:  136.1 kg    Height:  6\' 1"  (1.854 m)        Constitutional:  Obese male, alert and oriented x 3 .  Mild respiratory distress.  Sitting upright on stretcher  HEENT:      Head: Normocephalic and atraumatic.         Eyes: PERLA, EOMI, Conjunctivae are normal. Sclera is non-icteric.        Mouth/Throat: Mucous membranes are moist.       Neck: Supple with no signs of meningismus. Cardiovascular: Regular rate and rhythm. No murmurs, gallops, or rubs. 2+ symmetrical distal pulses are present . No JVD. No 2+ LE edema Respiratory: Respiratory effort increased.patient coughing.  Lungs sounds diminished bilaterally.  Scattered wheezes Gastrointestinal: Soft, non tender, and non distended with positive bowel sounds.  Genitourinary: No CVA tenderness. Musculoskeletal: Nontender with normal range of motion in all extremities. No cyanosis, or erythema of extremities. Neurologic:  Face is symmetric. Moving all extremities. No gross focal neurologic deficits . Skin: Skin is warm, dry.  Psychiatric: Mood and affect are normal    Labs on Admission: I have personally reviewed following labs and imaging studies  CBC: Recent Labs  Lab 01/04/21 0220  WBC 12.7*  NEUTROABS 9.3*  HGB 13.5  HCT 42.1  MCV 92.7  PLT 233   Basic Metabolic Panel: Recent Labs  Lab 01/04/21 0220  NA 136  K 4.6  CL 95*  CO2 33*  GLUCOSE 411*  BUN 18  CREATININE 1.29*  CALCIUM 8.8*   GFR: Estimated Creatinine Clearance: 84.9 mL/min (A) (by C-G formula based on SCr of 1.29 mg/dL (H)). Liver Function Tests: Recent Labs  Lab 01/04/21 0220  AST 17  ALT 18  ALKPHOS 56  BILITOT 0.8  PROT 6.4*  ALBUMIN 3.6   No results for input(s): LIPASE, AMYLASE in the last 168 hours. No results for input(s): AMMONIA in the last 168 hours. Coagulation Profile: Recent Labs  Lab 01/04/21 0220  INR 0.9   Cardiac Enzymes: No results for input(s): CKTOTAL, CKMB, CKMBINDEX, TROPONINI in the last 168 hours. BNP (last 3 results) No results for input(s): PROBNP in the last 8760 hours. HbA1C: No results for input(s): HGBA1C in the last 72 hours. CBG: No results for input(s): GLUCAP in the last 168 hours. Lipid Profile: No results for input(s): CHOL, HDL, LDLCALC, TRIG, CHOLHDL, LDLDIRECT in the last 72  hours. Thyroid Function Tests: No results for input(s): TSH, T4TOTAL, FREET4, T3FREE, THYROIDAB in the last 72 hours. Anemia Panel: No results for input(s): VITAMINB12, FOLATE, FERRITIN, TIBC, IRON, RETICCTPCT in the last 72 hours. Urine analysis:    Component Value Date/Time   COLORURINE Yellow 08/20/2013 0849   APPEARANCEUR Clear 08/20/2013 0849   LABSPEC 1.014 08/20/2013 0849   PHURINE 6.0 08/20/2013 0849   GLUCOSEU Negative 08/20/2013 0849   HGBUR Negative 08/20/2013 0849   BILIRUBINUR Negative 08/20/2013 0849   KETONESUR Negative 08/20/2013 0849   PROTEINUR Negative 08/20/2013 0849   NITRITE Negative 08/20/2013 0849   LEUKOCYTESUR Negative 08/20/2013 0849    Radiological Exams on Admission: DG Chest Port 1 View  Result Date: 01/04/2021 CLINICAL DATA:  65 year old male with shortness of breath. EXAM: PORTABLE CHEST 1 VIEW COMPARISON:  Chest radiograph dated 10/18/2020. FINDINGS: Minimal bibasilar atelectasis. No focal consolidation, pleural effusion, or pneumothorax. Stable cardiomegaly with mild central vascular congestion. Atherosclerotic calcification of the aorta. No acute osseous pathology. IMPRESSION: Cardiomegaly with mild central vascular congestion. No focal consolidation. Electronically Signed   By: Elgie CollardArash  Radparvar M.D.   On: 01/04/2021 03:07     Assessment/Plan 65 year old male with history of COPD on home O2 at 2 L, obesity, OSA, HTN, mild CAD on cardiac cath 02/2018, diastolic heart failure, last EF 55% November 2021, hospitalized in February in [place], for CHF exacerbation with NSTEMI, presenting with shortness of breath and wheezing.  Admits not having taken his medication in a few days.      Acute on chronic diastolic CHF - Patient presents with shortness of breath, orthopnea and lower extremity edema and wheezing.  BNP normal but chest x-ray with pulmonary vascular congestion.   - IV Lasix - Continue home lisinopril, metoprolol and Imdur - Continue Nitropaste  on the emergency room - Daily weights, intake and output monitoring - Echocardiogram in the a.m.  Last echo November 2021 with EF 55%.  Patient has remote history of EF of 35% back in 2018  Non-ST elevation MI (NSTEMI) (HCC) - Troponin elevated at 266 and EKG nonacute.  Suspect demand ischemia - Patient had left heart cath  July 2019 that showed mild CAD with mild CAD mid LAD - Continue heparin infusion started in the ED - Continue aspirin, atorvastatin and Imdur - Nitroglycerin sublingual as needed chest pain with morphine for breakthrough - Echocardiogram to evaluate for focal wall motion abnormalities - Cardiology consult    COPD with acute exacerbation (HCC)   Chronic respiratory failure with hypoxia (HCC) - Patient was treated with duo nebs and Solu-Medrol by EMS  - Continue duo nebs, antitussives - Supplemental oxygen    Hyperglycemia due to type 2 diabetes mellitus (HCC) - Blood sugar 411 in the emergency room - Sliding scale insulin coverage - Follow A1c  AKI - Creatinine 1.29, up from 0.81 about 6 months prior - Monitor renal function in view of IV Lasix therapy    Essential hypertension - Continue lisinopril, metoprolol and Nitropaste for now and can add home amlodipine if BP will tolerate    OSA (obstructive sleep apnea) - Patient has been prescribed CPAP in the past but has not been fitted    Obesity (BMI 30-39.9) -BMI 39.58.  Complicating factor to overall prognosis and care    DVT prophylaxis: Heparin infusion Code Status: full code  Family Communication:  none  Disposition Plan: Back to previous home environment Consults called: Cardiology Status:At the time of admission, it appears that the appropriate admission status for this patient is INPATIENT. This is judged to be reasonable and necessary in order to provide the required intensity of service to ensure the patient's safety given the presenting symptoms, physical exam findings, and initial radiographic  and laboratory data in the context of their  Comorbid conditions.   Patient requires inpatient status due to high intensity of service, high risk for further deterioration and high frequency of surveillance required.   I certify that at the point of admission it is my clinical judgment that the patient will require inpatient hospital care spanning beyond 2 midnights     Andris Baumann MD [facility]     01/04/2021, 3:47 AM

## 2021-01-04 NOTE — Progress Notes (Signed)
*  PRELIMINARY RESULTS* Echocardiogram 2D Echocardiogram has been performed.  Cody Alexander 01/04/2021, 10:07 AM

## 2021-01-04 NOTE — ED Notes (Signed)
Patient transported to echo ?

## 2021-01-04 NOTE — ED Notes (Signed)
Sent MD. Renae Gloss , message regarding pt blood sugar of 480

## 2021-01-04 NOTE — ED Triage Notes (Signed)
Pt to ED via EMS from home for SOB x4-5 days - hx of COPD and CHF. Per EMS, pt called for EMS earlier in the day and received a duoneb and then refused transport to the hospital. Pt received 2 duonebs and 125 solumedrol en route. On 2L O2 at baseline. Pt denies CP at this time

## 2021-01-04 NOTE — Progress Notes (Signed)
ANTICOAGULATION CONSULT NOTE - Initial Consult  Pharmacy Consult for Heparin  Indication: chest pain/ACS  Allergies  Allergen Reactions  . Erythromycin Anaphylaxis and Swelling    Had eye swelling with erythromycin during a time when he had perf ear drum.  Tolerated azithromycin.    Patient Measurements: Height: 6\' 1"  (185.4 cm) Weight: (!) 137.3 kg (302 lb 12.8 oz) IBW/kg (Calculated) : 79.9 Heparin Dosing Weight: 110.7 kg   Vital Signs: Temp: 97.8 F (36.6 C) (05/12 2136) Temp Source: Oral (05/12 2136) BP: 105/72 (05/12 2136) Pulse Rate: 84 (05/12 2136)  Labs: Recent Labs    01/04/21 0220 01/04/21 0359 01/04/21 1002 01/04/21 1658 01/04/21 2252  HGB 13.5  --   --   --   --   HCT 42.1  --   --   --   --   PLT 233  --   --   --   --   APTT 26  --   --   --   --   LABPROT 12.6  --   --   --   --   INR 0.9  --   --   --   --   HEPARINUNFRC  --   --  0.20* 0.34 0.35  CREATININE 1.29*  --   --   --   --   TROPONINIHS 266* 301*  --   --   --     Estimated Creatinine Clearance: 85.3 mL/min (A) (by C-G formula based on SCr of 1.29 mg/dL (H)).   Medical History: Past Medical History:  Diagnosis Date  . CHF (congestive heart failure) (HCC)   . COPD (chronic obstructive pulmonary disease) (HCC)   . Diabetes mellitus without complication (HCC)   . Hypertension   . Neuropathy     Medications:  Medications Prior to Admission  Medication Sig Dispense Refill Last Dose  . acetaminophen (TYLENOL) 500 MG tablet Take 1,000 mg by mouth every 6 (six) hours as needed.   prn at prn  . amLODipine (NORVASC) 10 MG tablet Take 1 tablet by mouth daily.   Past Week at Unknown time  . aspirin EC 81 MG tablet Take 81 mg by mouth daily. Swallow whole.   Past Week at Unknown time  . atorvastatin (LIPITOR) 40 MG tablet Take 1 tablet by mouth daily as needed.   Past Week at Unknown time  . cefdinir (OMNICEF) 300 MG capsule Take 300 mg by mouth 2 (two) times daily.   Past Week at Unknown  time  . furosemide (LASIX) 80 MG tablet Take 80 mg by mouth daily.   Past Week at Unknown time  . ipratropium (ATROVENT HFA) 17 MCG/ACT inhaler Inhale 1 puff into the lungs every 6 (six) hours as needed.   prn at prn  . ipratropium-albuterol (DUONEB) 0.5-2.5 (3) MG/3ML SOLN Inhale 3 mLs into the lungs every 4 (four) hours as needed.   prn at prn  . isosorbide dinitrate (ISORDIL) 30 MG tablet Take 30 mg by mouth every morning.   Past Week at Unknown time  . ketoconazole (NIZORAL) 2 % cream Apply 1 application topically 2 (two) times daily.   Past Week at Unknown time  . lisinopril (ZESTRIL) 20 MG tablet Take 20 mg by mouth 2 (two) times daily.   Past Week at Unknown time  . metFORMIN (GLUCOPHAGE) 1000 MG tablet Take 1,000 mg by mouth 2 (two) times daily.   Past Week at Unknown time  . metoprolol tartrate (LOPRESSOR) 25 MG  tablet Take 25 mg by mouth 2 (two) times daily.   Past Week at Unknown time  . PROAIR HFA 108 (90 Base) MCG/ACT inhaler Inhale 2 puffs into the lungs every 4 (four) hours as needed for wheezing.   prn at prn  . SYMBICORT 160-4.5 MCG/ACT inhaler Inhale 2 puffs into the lungs 2 (two) times daily.   Past Week at Unknown time  . triamcinolone (KENALOG) 0.025 % cream Apply 1 application topically 2 (two) times daily.   Past Week at Unknown time    Assessment: Pharmacy consulted to dose heparin in this 65 year old male admitted with ACS/NSTEMI.  CrCl = 84.9 ml/min No prior anticoag noted.   5/12@1002  HL: 0.2, subtherapeutic  Goal of Therapy:  Heparin level 0.3-0.7 units/ml Monitor platelets by anticoagulation protocol: Yes   Plan:  5/12: HL @ 2252 = 0.35, therapeutic X 2  Will continue pt on current rate and recheck HL on 5/13 @ 0500.   Juletta Berhe D 01/04/2021,11:26 PM

## 2021-01-04 NOTE — Consult Note (Signed)
Cody Alexander is a 65 y.o. male  [id number]  Primary Cardiologist: Adrian Blackwater Reason for Consultation: Chest pain  HPI: This is a 65 year old white male with a history of congestive heart failure due to HFpEF who had cardiac catheterization July 2019 presented to the hospital after not taking his medication for few days with chest pain and shortness of breath.  Patient still having intermittent chest pain and was recommended cardiac catheterization but refused.   Review of Systems: Patient does have orthopnea PND and leg swelling   Past Medical History:  Diagnosis Date  . CHF (congestive heart failure) (HCC)   . COPD (chronic obstructive pulmonary disease) (HCC)   . Diabetes mellitus without complication (HCC)   . Hypertension   . Neuropathy     (Not in a hospital admission)    . aspirin EC  81 mg Oral Daily  . atorvastatin  40 mg Oral Daily  . furosemide  60 mg Intravenous BID  . insulin aspart  0-20 Units Subcutaneous TID WC  . insulin aspart  0-5 Units Subcutaneous QHS  . insulin aspart  4 Units Subcutaneous TID WC  . insulin glargine  15 Units Subcutaneous Daily  . lisinopril  20 mg Oral BID  . metoprolol tartrate  25 mg Oral BID  . nitroGLYCERIN  1 inch Topical Q6H    Infusions: . heparin 1,500 Units/hr (01/04/21 0400)    Allergies  Allergen Reactions  . Erythromycin Anaphylaxis and Swelling    Had eye swelling with erythromycin during a time when he had perf ear drum.  Tolerated azithromycin.    Social History   Socioeconomic History  . Marital status: Single    Spouse name: Not on file  . Number of children: Not on file  . Years of education: Not on file  . Highest education level: Not on file  Occupational History  . Not on file  Tobacco Use  . Smoking status: Never Smoker  . Smokeless tobacco: Never Used  Substance and Sexual Activity  . Alcohol use: No  . Drug use: Never  . Sexual activity: Not on file  Other Topics Concern  . Not  on file  Social History Narrative  . Not on file   Social Determinants of Health   Financial Resource Strain: Not on file  Food Insecurity: Not on file  Transportation Needs: Not on file  Physical Activity: Not on file  Stress: Not on file  Social Connections: Not on file  Intimate Partner Violence: Not on file    History reviewed. No pertinent family history.  PHYSICAL EXAM: Vitals:   01/04/21 0630 01/04/21 0722  BP: (!) 151/89 (!) 146/95  Pulse: 92 95  Resp: (!) 21 (!) 23  Temp:    SpO2: 93% 91%     Intake/Output Summary (Last 24 hours) at 01/04/2021 0738 Last data filed at 01/04/2021 0656 Gross per 24 hour  Intake 250 ml  Output 1000 ml  Net -750 ml    General:  Well appearing. No respiratory difficulty HEENT: normal Neck: supple. no JVD. Carotids 2+ bilat; no bruits. No lymphadenopathy or thryomegaly appreciated. Cor: PMI nondisplaced. Regular rate & rhythm. No rubs, gallops or murmurs. Lungs: clear Abdomen: soft, nontender, nondistended. No hepatosplenomegaly. No bruits or masses. Good bowel sounds. Extremities: no cyanosis, clubbing, rash, edema Neuro: alert & oriented x 3, cranial nerves grossly intact. moves all 4 extremities w/o difficulty. Affect pleasant.  ECG: Normal sinus rhythm nonspecific ST-T changes  Results for orders placed  or performed during the hospital encounter of 01/04/21 (from the past 24 hour(s))  CBC with Differential     Status: Abnormal   Collection Time: 01/04/21  2:20 AM  Result Value Ref Range   WBC 12.7 (H) 4.0 - 10.5 K/uL   RBC 4.54 4.22 - 5.81 MIL/uL   Hemoglobin 13.5 13.0 - 17.0 g/dL   HCT 30.1 60.1 - 09.3 %   MCV 92.7 80.0 - 100.0 fL   MCH 29.7 26.0 - 34.0 pg   MCHC 32.1 30.0 - 36.0 g/dL   RDW 23.5 57.3 - 22.0 %   Platelets 233 150 - 400 K/uL   nRBC 0.0 0.0 - 0.2 %   Neutrophils Relative % 73 %   Neutro Abs 9.3 (H) 1.7 - 7.7 K/uL   Lymphocytes Relative 15 %   Lymphs Abs 1.9 0.7 - 4.0 K/uL   Monocytes Relative 8 %    Monocytes Absolute 1.1 (H) 0.1 - 1.0 K/uL   Eosinophils Relative 3 %   Eosinophils Absolute 0.3 0.0 - 0.5 K/uL   Basophils Relative 1 %   Basophils Absolute 0.1 0.0 - 0.1 K/uL   Immature Granulocytes 0 %   Abs Immature Granulocytes 0.05 0.00 - 0.07 K/uL  Comprehensive metabolic panel     Status: Abnormal   Collection Time: 01/04/21  2:20 AM  Result Value Ref Range   Sodium 136 135 - 145 mmol/L   Potassium 4.6 3.5 - 5.1 mmol/L   Chloride 95 (L) 98 - 111 mmol/L   CO2 33 (H) 22 - 32 mmol/L   Glucose, Bld 411 (H) 70 - 99 mg/dL   BUN 18 8 - 23 mg/dL   Creatinine, Ser 2.54 (H) 0.61 - 1.24 mg/dL   Calcium 8.8 (L) 8.9 - 10.3 mg/dL   Total Protein 6.4 (L) 6.5 - 8.1 g/dL   Albumin 3.6 3.5 - 5.0 g/dL   AST 17 15 - 41 U/L   ALT 18 0 - 44 U/L   Alkaline Phosphatase 56 38 - 126 U/L   Total Bilirubin 0.8 0.3 - 1.2 mg/dL   GFR, Estimated >27 >06 mL/min   Anion gap 8 5 - 15  Brain natriuretic peptide     Status: None   Collection Time: 01/04/21  2:20 AM  Result Value Ref Range   B Natriuretic Peptide 93.2 0.0 - 100.0 pg/mL  Troponin I (High Sensitivity)     Status: Abnormal   Collection Time: 01/04/21  2:20 AM  Result Value Ref Range   Troponin I (High Sensitivity) 266 (HH) <18 ng/L  APTT     Status: None   Collection Time: 01/04/21  2:20 AM  Result Value Ref Range   aPTT 26 24 - 36 seconds  Protime-INR     Status: None   Collection Time: 01/04/21  2:20 AM  Result Value Ref Range   Prothrombin Time 12.6 11.4 - 15.2 seconds   INR 0.9 0.8 - 1.2  Resp Panel by RT-PCR (Flu A&B, Covid) Nasopharyngeal Swab     Status: None   Collection Time: 01/04/21  2:21 AM   Specimen: Nasopharyngeal Swab; Nasopharyngeal(NP) swabs in vial transport medium  Result Value Ref Range   SARS Coronavirus 2 by RT PCR NEGATIVE NEGATIVE   Influenza A by PCR NEGATIVE NEGATIVE   Influenza B by PCR NEGATIVE NEGATIVE  CBG monitoring, ED     Status: Abnormal   Collection Time: 01/04/21  3:44 AM  Result Value Ref  Range   Glucose-Capillary 374 (  H) 70 - 99 mg/dL  Troponin I (High Sensitivity)     Status: Abnormal   Collection Time: 01/04/21  3:59 AM  Result Value Ref Range   Troponin I (High Sensitivity) 301 (HH) <18 ng/L  Lipid panel     Status: Abnormal   Collection Time: 01/04/21  3:59 AM  Result Value Ref Range   Cholesterol 173 0 - 200 mg/dL   Triglycerides 82 <026 mg/dL   HDL 54 >37 mg/dL   Total CHOL/HDL Ratio 3.2 RATIO   VLDL 16 0 - 40 mg/dL   LDL Cholesterol 858 (H) 0 - 99 mg/dL  CBG monitoring, ED     Status: Abnormal   Collection Time: 01/04/21  7:23 AM  Result Value Ref Range   Glucose-Capillary 496 (H) 70 - 99 mg/dL   DG Chest Port 1 View  Result Date: 01/04/2021 CLINICAL DATA:  65 year old male with shortness of breath. EXAM: PORTABLE CHEST 1 VIEW COMPARISON:  Chest radiograph dated 10/18/2020. FINDINGS: Minimal bibasilar atelectasis. No focal consolidation, pleural effusion, or pneumothorax. Stable cardiomegaly with mild central vascular congestion. Atherosclerotic calcification of the aorta. No acute osseous pathology. IMPRESSION: Cardiomegaly with mild central vascular congestion. No focal consolidation. Electronically Signed   By: Elgie Collard M.D.   On: 01/04/2021 03:07     ASSESSMENT AND PLAN: Chest pain with mildly elevated troponin and HFpEF presented after cessation of his medications for the past few days.  Patient was explained risk and benefits of cardiac catheterization in front of a nurse and has refused to have cardiac catheterization at this time.  Patient did have cardiac catheterization in July 2019 and only had 30% mid LAD irregularities with right coronary and left circumflex being normal.  Ejection fraction at that time was 60%.  Advised starting the patient on IV Lasix ACE inhibitor's and Jardiance.  Advise getting echocardiogram.  Myishia Kasik A

## 2021-01-04 NOTE — Progress Notes (Addendum)
Patient ID: Cody Alexander, male   DOB: 04-05-1956, 65 y.o.   MRN: [id number] [facility] PROGRESS NOTE  Cody Alexander KYH:[id number] DOB: [birth year] DOA: 01/04/2021 PCP: Cody Jewel, Cody Alexander  HPI/Subjective: Patient states that he has been having shortness of breath going on for a while.  Coughing quite a bit.  Sweating quite a bit.  Currently no chest pain but did have chest pain the other day.  Patient states his sugars were good on metformin prior to getting sick.  Admitted with COPD exacerbation also found to have elevated cardiac enzyme.   Objective: Vitals:   01/04/21 1100 01/04/21 1142  BP: 135/72 118/85  Pulse: 92 93  Resp: (!) 25 18  Temp:    SpO2: 92% 93%    Intake/Output Summary (Last 24 hours) at 01/04/2021 1247 Last data filed at 01/04/2021 0932 Gross per 24 hour  Intake 250 ml  Output 2300 ml  Net -2050 ml   Filed Weights   01/04/21 0216  Weight: 136.1 kg    ROS: Review of Systems  Respiratory: Positive for cough, shortness of breath and wheezing.   Cardiovascular: Negative for chest pain.  Gastrointestinal: Negative for abdominal pain, nausea and vomiting.  Skin: Positive for itching and rash.   Exam: Physical Exam HENT:     Head: Normocephalic.     Mouth/Throat:     Pharynx: No oropharyngeal exudate.  Eyes:     General: Lids are normal.     Conjunctiva/sclera: Conjunctivae normal.     Pupils: Pupils are equal, round, and reactive to light.  Cardiovascular:     Rate and Rhythm: Normal rate and regular rhythm.     Heart sounds: Normal heart sounds, S1 normal and S2 normal.  Pulmonary:     Breath sounds: Transmitted upper airway sounds present. Examination of the right-middle field reveals decreased breath sounds and wheezing. Examination of the left-middle field reveals decreased breath sounds and wheezing. Examination of the right-lower field reveals decreased breath sounds and rhonchi. Examination of the left-lower field reveals decreased  breath sounds and rhonchi. Decreased breath sounds, wheezing and rhonchi present. No rales.  Abdominal:     General: There is distension.     Palpations: Abdomen is soft.     Tenderness: There is no abdominal tenderness.  Musculoskeletal:     Right ankle: Swelling present.     Left ankle: Swelling present.  Skin:    General: Skin is warm.     Comments: Large diffuse rash on back  Neurological:     Mental Status: He is alert and oriented to person, place, and time.       Data Reviewed: Basic Metabolic Panel: Recent Labs  Lab 01/04/21 0220  NA 136  K 4.6  CL 95*  CO2 33*  GLUCOSE 411*  BUN 18  CREATININE 1.29*  CALCIUM 8.8*   Liver Function Tests: Recent Labs  Lab 01/04/21 0220  AST 17  ALT 18  ALKPHOS 56  BILITOT 0.8  PROT 6.4*  ALBUMIN 3.6   CBC: Recent Labs  Lab 01/04/21 0220  WBC 12.7*  NEUTROABS 9.3*  HGB 13.5  HCT 42.1  MCV 92.7  PLT 233   BNP (last 3 results) Recent Labs    06/20/20 1505 01/04/21 0220  BNP 136.0* 93.2    CBG: Recent Labs  Lab 01/04/21 0344 01/04/21 0723 01/04/21 1015 01/04/21 1148  GLUCAP 374* 496* 491* 480*    Recent Results (from the past 240 hour(s))  Resp Panel by RT-PCR (Flu  A&B, Covid) Nasopharyngeal Swab     Status: None   Collection Time: 01/04/21  2:21 AM   Specimen: Nasopharyngeal Swab; Nasopharyngeal(NP) swabs in vial transport medium  Result Value Ref Range Status   SARS Coronavirus 2 by RT PCR NEGATIVE NEGATIVE Final    Comment: (NOTE) SARS-CoV-2 target nucleic acids are NOT DETECTED.  The SARS-CoV-2 RNA is generally detectable in upper respiratory specimens during the acute phase of infection. The lowest concentration of SARS-CoV-2 viral copies this assay can detect is 138 copies/mL. A negative result does not preclude SARS-Cov-2 infection and should not be used as the sole basis for treatment or other patient management decisions. A negative result may occur with  improper specimen  collection/handling, submission of specimen other than nasopharyngeal swab, presence of viral mutation(s) within the areas targeted by this assay, and inadequate number of viral copies(<138 copies/mL). A negative result must be combined with clinical observations, patient history, and epidemiological information. The expected result is Negative.  Fact Sheet for Patients:  BloggerCourse.com  Fact Sheet for Healthcare Providers:  SeriousBroker.it  This test is no t yet approved or cleared by the [place] FDA and  has been authorized for detection and/or diagnosis of SARS-CoV-2 by FDA under an Emergency Use Authorization (EUA). This EUA will remain  in effect (meaning this test can be used) for the duration of the COVID-19 declaration under Section 564(b)(1) of the Act, 21 U.S.C.section 360bbb-3(b)(1), unless the authorization is terminated  or revoked sooner.       Influenza A by PCR NEGATIVE NEGATIVE Final   Influenza B by PCR NEGATIVE NEGATIVE Final    Comment: (NOTE) The Xpert Xpress SARS-CoV-2/FLU/RSV plus assay is intended as an aid in the diagnosis of influenza from Nasopharyngeal swab specimens and should not be used as a sole basis for treatment. Nasal washings and aspirates are unacceptable for Xpert Xpress SARS-CoV-2/FLU/RSV testing.  Fact Sheet for Patients: BloggerCourse.com  Fact Sheet for Healthcare Providers: SeriousBroker.it  This test is not yet approved or cleared by the [place] FDA and has been authorized for detection and/or diagnosis of SARS-CoV-2 by FDA under an Emergency Use Authorization (EUA). This EUA will remain in effect (meaning this test can be used) for the duration of the COVID-19 declaration under Section 564(b)(1) of the Act, 21 U.S.C. section 360bbb-3(b)(1), unless the authorization is terminated or revoked.  Performed at [facility], [address], [place], [place] [address]      Studies: [facility] Chest [place] 1 View  Result Date: 01/04/2021 CLINICAL DATA:  65 year old male with shortness of breath. EXAM: PORTABLE CHEST 1 VIEW COMPARISON:  Chest radiograph dated 10/18/2020. FINDINGS: Minimal bibasilar atelectasis. No focal consolidation, pleural effusion, or pneumothorax. Stable cardiomegaly with mild central vascular congestion. Atherosclerotic calcification of the aorta. No acute osseous pathology. IMPRESSION: Cardiomegaly with mild central vascular congestion. No focal consolidation. Electronically Signed   By: Elgie Collard M.D.   On: 01/04/2021 03:07    Scheduled Meds: . acetylcysteine  3 mL Nebulization BID  . aspirin EC  81 mg Oral Daily  . atorvastatin  40 mg Oral Daily  . budesonide (PULMICORT) nebulizer solution  0.5 mg Nebulization BID  . doxycycline  100 mg Oral Q12H  . furosemide  60 mg Intravenous BID  . insulin aspart  0-20 Units Subcutaneous TID WC  . insulin aspart  0-5 Units Subcutaneous QHS  . insulin aspart  4 Units Subcutaneous TID WC  . insulin glargine  15 Units Subcutaneous Daily  .  ipratropium-albuterol  3 mL Nebulization TID  . lisinopril  20 mg Oral BID  . metoprolol tartrate  25 mg Oral BID  . nitroGLYCERIN  1 inch Topical Q6H  . spironolactone  12.5 mg Oral Daily  . triamcinolone cream   Topical TID   Continuous Infusions: . heparin 1,750 Units/hr (01/04/21 1105)    Assessment/Plan:  1. COPD exacerbation.  Patient received IV Solu-Medrol via EMS.  We will continue Solu-Medrol daily starting tomorrow.  We will give DuoNeb nebulizer solution 3 times daily.  Start budesonide nebulizers and Mucomyst nebulizers.  Add doxycycline.  Tussionex. 2. Acute on chronic diastolic congestive heart failure on IV Lasix, lisinopril metoprolol and Imdur. 3. Elevated troponin and possible NSTEMI.  Patient declined cardiac catheterization at this time.  On heparin drip aspirin and  beta-blocker. 4. Chronic hypoxic respiratory failure on 2 L of oxygen chronically.  Patient states he does not have a portable tank at home. 5. Uncontrolled type II diabetes with hyperglycemia.  Start low-dose Lantus.  Sliding scale insulin.  Hemoglobin A1c still pending. 6. Rash on back we will give triamcinolone cream 7. Patient states he has a history of hepatitis C and we will check a hepatitis C antibody 8. Obesity with a BMI of 39.58        Code Status:     Code Status Orders  (From admission, onward)         Start     Ordered   01/04/21 0339  Full code  Continuous        01/04/21 0347        Code Status History    Date Active Date Inactive Code Status Order ID Comments User Context   06/20/2020 1959 06/25/2020 0032 Full Code [id number]  Rometta Emery, MD ED   02/25/2018 1608 02/28/2018 0724 Full Code [id number]  Katha Hamming, MD ED   Advance Care Planning Activity     Disposition Plan: Status is: Inpatient  Dispo: The patient is from: Home              Anticipated d/c is to: Home              Patient currently being treated for possible NSTEMI and COPD exacerbation requiring IV medications   Difficult to place patient.  No.  Consultants:  Cardiology  Antibiotics:  Doxycycline  Time spent: 35 minutes  Benjerman [employer]

## 2021-01-04 NOTE — ED Notes (Signed)
MD Wieting advised to following sliding scale

## 2021-01-04 NOTE — Evaluation (Signed)
Physical Therapy Evaluation Patient Details Name: Cody Alexander MRN: [id number] DOB: 09-29-1955 Today's Date: 01/04/2021   History of Present Illness  65 y.o. male with medical history significant for COPD on home O2 at 2 L, obesity, OSA, HTN, mild CAD on cardiac cath 02/2018, diastolic heart failure, last EF 55% November 2021, hospitalized in February in [place], for CHF exacerbation with NSTEMI, who presents by EMS with shortness of breath and wheezing that started the day prior.  Pt also recently treated for UTI, apparently had stopped taking antibiotics.  Clinical Impression  Pt able to move relatively well with labored effort.  He wears 2L O2 at baseline and will at times even go w/o it, but he struggled to keep O2 >92% on 4L during activity though he did stay above 88% nearly the entire time with sustained standing/walking activity in the room.  Pt was able to hold conversation much of the time but clearly became more fatigue and increased DOE with increased activity.  Pt with no LOBs and good confidence using cane for <50 ft of in room ambulation but he is not at all near his baseline.  Pt motivated, needing consistent cuing to insure appropriate breathing, suggested cardiac rehab but he reports he could not get to it, may benefit from HHPT though he did not seem too interested.     Follow Up Recommendations Home health PT (discussed cardiac rehab, feels he would struggle to actually make it in)    Equipment Recommendations  Gilmer Mor (pt adamant he will not use a walker)    Recommendations for Other Services       Precautions / Restrictions Precautions Precautions: Fall Restrictions Weight Bearing Restrictions: No      Mobility  Bed Mobility Overal bed mobility: Modified Independent             General bed mobility comments: Pt needing UEs and momentum to get to EOB, no direct assist    Transfers Overall transfer level: Modified independent Equipment used: Straight cane              General transfer comment: Pt able to rise with relative ease, definite need of UE/SPC  Ambulation/Gait Ambulation/Gait assistance: Supervision Gait Distance (Feet): 50 Feet Assistive device: Straight cane       General Gait Details: Multiple small loops in room (pt did not wish to leave room) on 4L O2.  Pt with shortness of breath and increasing fatigue with the effort but able to hold conversation and maintain O2 sats 88-92 the entire time.  Pt reliant on SPC, no LOBs, good confidence despte fatigue.  Stairs            Wheelchair Mobility    Modified Rankin (Stroke Patients Only)       Balance Overall balance assessment: Needs assistance Sitting-balance support: No upper extremity supported Sitting balance-Leahy Scale: Normal     Standing balance support: Single extremity supported Standing balance-Leahy Scale: Good Standing balance comment: Pt needing SPC assist but no LOBs or stagger stepping.  He does have a history of falls/knees buckling                             Pertinent Vitals/Pain      Home Living Family/patient expects to be discharged to:: Private residence Living Arrangements: Alone Available Help at Discharge: Family;Available PRN/intermittently   Home Access: Stairs to enter   Entrance Stairs-Number of Steps: 3   Home Equipment: None (apparently  he recently got a cane and it broke in a week)      Prior Function Level of Independence: Independent         Comments: Pt does shopping, takes care of household chores and yardwork, including mowing a 2+ acre yard     Hand Dominance        Extremity/Trunk Assessment   Upper Extremity Assessment Upper Extremity Assessment: Overall WFL for tasks assessed (pt with baseline tremor b/l)    Lower Extremity Assessment Lower Extremity Assessment: Overall WFL for tasks assessed;Generalized weakness       Communication   Communication: No difficulties  Cognition  Arousal/Alertness: Awake/alert Behavior During Therapy: Restless Overall Cognitive Status: Within Functional Limits for tasks assessed                                        General Comments      Exercises     Assessment/Plan    PT Assessment Patient needs continued PT services  PT Problem List Decreased activity tolerance;Decreased balance;Decreased mobility;Decreased strength;Decreased knowledge of use of DME;Decreased safety awareness       PT Treatment Interventions DME instruction;Gait training;Stair training;Therapeutic activities;Therapeutic exercise;Balance training;Patient/family education;Functional mobility training    PT Goals (Current goals can be found in the Care Plan section)  Acute Rehab PT Goals Patient Stated Goal: get breating better and go home PT Goal Formulation: With patient Time For Goal Achievement: 01/18/21 Potential to Achieve Goals: Fair    Frequency Min 2X/week   Barriers to discharge        Co-evaluation               AM-PAC PT "6 Clicks" Mobility  Outcome Measure Help needed turning from your back to your side while in a flat bed without using bedrails?: None Help needed moving from lying on your back to sitting on the side of a flat bed without using bedrails?: None Help needed moving to and from a bed to a chair (including a wheelchair)?: None Help needed standing up from a chair using your arms (e.g., wheelchair or bedside chair)?: None Help needed to walk in hospital room?: A Little Help needed climbing 3-5 steps with a railing? : A Little 6 Click Score: 22    End of Session Equipment Utilized During Treatment: Gait belt;Oxygen Activity Tolerance: Patient limited by fatigue Patient left: with bed alarm set;with call bell/phone within reach Nurse Communication: Mobility status PT Visit Diagnosis: Unsteadiness on feet (R26.81);Muscle weakness (generalized) (M62.81)    Time: 2130-8657 PT Time Calculation  (min) (ACUTE ONLY): 28 min   Charges:   PT Evaluation $PT Eval Low Complexity: 1 Low PT Treatments $Gait Training: 8-22 mins        Malachi Pro, DPT 01/04/2021, 5:07 PM

## 2021-01-04 NOTE — ED Notes (Signed)
MD Kahn at bedside.

## 2021-01-04 NOTE — Progress Notes (Signed)
ANTICOAGULATION CONSULT NOTE - Initial Consult  Pharmacy Consult for Heparin  Indication: chest pain/ACS  Allergies  Allergen Reactions  . Erythromycin Anaphylaxis and Swelling    Had eye swelling with erythromycin during a time when he had perf ear drum.  Tolerated azithromycin.    Patient Measurements: Height: 6\' 1"  (185.4 cm) Weight: 136.1 kg (300 lb) (no bed scale available) IBW/kg (Calculated) : 79.9 Heparin Dosing Weight: 110.7 kg   Vital Signs: Temp: 98.6 F (37 C) (05/12 0212) Temp Source: Oral (05/12 0212) BP: 160/96 (05/12 0218) Pulse Rate: 95 (05/12 0225)  Labs: Recent Labs    01/04/21 0220  HGB 13.5  HCT 42.1  PLT 233  CREATININE 1.29*  TROPONINIHS 266*    Estimated Creatinine Clearance: 84.9 mL/min (A) (by C-G formula based on SCr of 1.29 mg/dL (H)).   Medical History: Past Medical History:  Diagnosis Date  . CHF (congestive heart failure) (HCC)   . COPD (chronic obstructive pulmonary disease) (HCC)   . Diabetes mellitus without complication (HCC)   . Hypertension   . Neuropathy     Medications:  (Not in a hospital admission)   Assessment: Pharmacy consulted to dose heparin in this 65 year old male admitted with ACS/NSTEMI.  CrCl = 84.9 ml/min No prior anticoag noted.   Goal of Therapy:  Heparin level 0.3-0.7 units/ml Monitor platelets by anticoagulation protocol: Yes   Plan:  Give 4000 units bolus x 1 Start heparin infusion at 1500 units/hr Check anti-Xa level in 6 hours and daily while on heparin Continue to monitor H&H and platelets  Deniro Laymon D 01/04/2021,3:32 AM

## 2021-01-04 NOTE — ED Notes (Signed)
Pt provided with water and graham crackers. 

## 2021-01-04 NOTE — ED Notes (Signed)
MD. Park Breed discussed possible catherization , pt refused

## 2021-01-04 NOTE — ED Notes (Signed)
Breakfast tray given. °

## 2021-01-04 NOTE — Progress Notes (Signed)
Pt arrived to floor form ED at 1505. Alert and oriented x4. No complaints of pain. Evening blood sugar was 462. MD made aware and ordered 28u insulin stat one time. Insulin was administered per order. Pt complaining of itchy rash on back that he said was "ringworm" Applied ordered triamcinolone cream. Report given to Traci RN at bedside.

## 2021-01-04 NOTE — ED Notes (Signed)
Pt eating food tray

## 2021-01-04 NOTE — ED Provider Notes (Signed)
[facility] Emergency Department Provider Note   ____________________________________________   Event Date/Time   First MD Initiated Contact with Patient 01/04/21 [phone]     (approximate)  I have reviewed the triage vital signs and the nursing notes.   HISTORY  Chief Complaint Shortness of Breath    HPI Cody Alexander is a 65 y.o. male brought to the ED via EMS from home with a chief complaint of shortness of breath times several days.  Patient has a history of COPD and CHF on 2 L continuous oxygen.  States he mowed a big yard several days ago and has been having cough and difficulty breathing since.  EMS was called earlier to the house for similar complaints and patient given breathing treatment and refused transport to the hospital.  This time patient given 2 duo nebs and 125 mg IV Solu-Medrol on route.  Symptoms associated with chest pain. Denies diaphoresis, abdominal pain, nausea, vomiting or dizziness.  Has not taken any of his medications today. Currently on antibiotic for UTI.      Past Medical History:  Diagnosis Date  . CHF (congestive heart failure) (HCC)   . COPD (chronic obstructive pulmonary disease) (HCC)   . Diabetes mellitus without complication (HCC)   . Hypertension   . Neuropathy     Patient Active Problem List   Diagnosis Date Noted  . Hyperglycemia due to type 2 diabetes mellitus (HCC) 01/04/2021  . Acute on chronic diastolic CHF (congestive heart failure) (HCC) 01/04/2021  . CHF (congestive heart failure), NYHA class I, acute, diastolic (HCC) 01/04/2021  . OSA (obstructive sleep apnea) 01/04/2021  . Obesity (BMI 30-39.9) 01/04/2021  . Chronic respiratory failure with hypoxia (HCC) 01/04/2021  . Acute on chronic respiratory failure with hypoxia and hypercapnia (HCC) 06/20/2020  . Non-ST elevation MI (NSTEMI) (HCC) 02/25/2018  . Obesity, Class III, BMI 40-49.9 (morbid obesity) (HCC) 01/19/2017  . COPD with acute exacerbation  (HCC) 05/13/2014  . DM type 2 with diabetic peripheral neuropathy (HCC) 05/13/2014  . Essential hypertension 05/13/2014    Past Surgical History:  Procedure Laterality Date  . LEFT HEART CATH AND CORONARY ANGIOGRAPHY Right 02/27/2018   Procedure: LEFT HEART CATH AND CORONARY ANGIOGRAPHY;  Surgeon: Laurier Nancy, MD;  Location: [facility] INVASIVE CV LAB;  Service: Cardiovascular;  Laterality: Right;    Prior to Admission medications   Medication Sig Start Date End Date Taking? Authorizing Provider  acetaminophen (TYLENOL) 500 MG tablet Take 1,000 mg by mouth every 6 (six) hours as needed. 10/21/20  Yes [provider]  amLODipine (NORVASC) 10 MG tablet Take 1 tablet by mouth daily. 11/14/20  Yes [provider]  aspirin EC 81 MG tablet Take 81 mg by mouth daily. Swallow whole.   Yes [provider]  atorvastatin (LIPITOR) 40 MG tablet Take 1 tablet by mouth daily as needed. 11/14/20  Yes [provider]  cefdinir (OMNICEF) 300 MG capsule Take 300 mg by mouth 2 (two) times daily. 12/31/20  Yes [provider]  furosemide (LASIX) 80 MG tablet Take 80 mg by mouth daily. 11/14/20  Yes [provider]  ipratropium (ATROVENT HFA) 17 MCG/ACT inhaler Inhale 1 puff into the lungs every 6 (six) hours as needed. 02/01/19  Yes [provider]  ipratropium-albuterol (DUONEB) 0.5-2.5 (3) MG/3ML SOLN Inhale 3 mLs into the lungs every 4 (four) hours as needed. 11/10/18  Yes [provider]  isosorbide dinitrate (ISORDIL) 30 MG tablet Take 30 mg by mouth every morning.  11/14/20  Yes [provider]  ketoconazole (NIZORAL) 2 % cream Apply 1 application topically 2 (two) times daily. 06/02/20  Yes [provider]  lisinopril (ZESTRIL) 20 MG tablet Take 20 mg by mouth 2 (two) times daily.   Yes [provider]  metFORMIN (GLUCOPHAGE) 1000 MG tablet Take 1,000 mg by mouth 2 (two) times daily. 06/02/20  Yes [provider]   metoprolol tartrate (LOPRESSOR) 25 MG tablet Take 25 mg by mouth 2 (two) times daily.   Yes [provider]  PROAIR HFA 108 (90 Base) MCG/ACT inhaler Inhale 2 puffs into the lungs every 4 (four) hours as needed for wheezing. 06/02/20  Yes [provider]  SYMBICORT 160-4.5 MCG/ACT inhaler Inhale 2 puffs into the lungs 2 (two) times daily. 06/02/20  Yes [provider]  triamcinolone (KENALOG) 0.025 % cream Apply 1 application topically 2 (two) times daily. 10/31/20  Yes [provider]    Allergies Erythromycin  No family history on file.  Social History Social History   Tobacco Use  . Smoking status: Never Smoker  . Smokeless tobacco: Never Used  Substance Use Topics  . Alcohol use: No  . Drug use: Never    Review of Systems  Constitutional: No fever/chills Eyes: No visual changes. ENT: No sore throat. Cardiovascular: Denies chest pain. Respiratory: Positive for cough and shortness of breath. Gastrointestinal: No abdominal pain.  No nausea, no vomiting.  No diarrhea.  No constipation. Genitourinary: Negative for dysuria. Musculoskeletal: Negative for back pain. Skin: Negative for rash. Neurological: Negative for headaches, focal weakness or numbness.   ____________________________________________   PHYSICAL EXAM:  VITAL SIGNS: ED Triage Vitals  Enc Vitals Group     BP --      Pulse Rate 01/04/21 0212 (!) 101     Resp 01/04/21 0212 (!) 24     Temp 01/04/21 0212 98.6 F (37 C)     Temp Source 01/04/21 0212 Oral     SpO2 01/04/21 0211 97 %     Weight --      Height --      Head Circumference --      Peak Flow --      Pain Score --      Pain Loc --      Pain Edu? --      Excl. in GC? --     Constitutional: Alert and oriented. Well appearing and in mild acute distress. Eyes: Conjunctivae are normal. PERRL. EOMI. Head: Atraumatic. Nose: No congestion/rhinnorhea. Mouth/Throat: Mucous membranes are moist.   Neck: No stridor.    Cardiovascular: Normal rate, regular rhythm. Grossly normal heart sounds.  Good peripheral circulation. Respiratory: Increased respiratory effort.  No retractions. Lungs diminished with wheezing. Gastrointestinal: Soft and nontender to light or deep palpation.  Reducible umbilical hernia. No distention. No abdominal bruits. No CVA tenderness. Musculoskeletal: No lower extremity tenderness.  1+ BLE pitting edema.  No joint effusions. Neurologic:  Normal speech and language. No gross focal neurologic deficits are appreciated.  Skin:  Skin is warm, dry and intact. No rash noted. Psychiatric: Mood and affect are normal. Speech and behavior are normal.  ____________________________________________   LABS (all labs ordered are listed, but only abnormal results are displayed)  Labs Reviewed  CBC WITH DIFFERENTIAL/PLATELET - Abnormal; Notable for the following components:      Result Value   WBC 12.7 (*)    Neutro Abs 9.3 (*)    Monocytes Absolute 1.1 (*)    All  other components within normal limits  COMPREHENSIVE METABOLIC PANEL - Abnormal; Notable for the following components:   Chloride 95 (*)    CO2 33 (*)    Glucose, Bld 411 (*)    Creatinine, Ser 1.29 (*)    Calcium 8.8 (*)    Total Protein 6.4 (*)    All other components within normal limits  CBG MONITORING, ED - Abnormal; Notable for the following components:   Glucose-Capillary 374 (*)    All other components within normal limits  TROPONIN I (HIGH SENSITIVITY) - Abnormal; Notable for the following components:   Troponin I (High Sensitivity) 266 (*)    All other components within normal limits  RESP PANEL BY RT-PCR (FLU A&B, COVID) ARPGX2  BRAIN NATRIURETIC PEPTIDE  APTT  PROTIME-INR  HEMOGLOBIN A1C  LIPID PANEL  HEPARIN LEVEL (UNFRACTIONATED)  TROPONIN I (HIGH SENSITIVITY)   ____________________________________________  EKG  ED ECG REPORT I, Nasire Reali J, the attending physician, personally viewed and interpreted  this ECG.   Date: 01/04/2021  EKG Time: 0216  Rate: 101  Rhythm: sinus tachycardia  Axis: Normal  Intervals:none  ST&T Change: Nonspecific  ____________________________________________  RADIOLOGY Tawni MillersI, Maalik Pinn J, personally viewed and evaluated these images (plain radiographs) as part of my medical decision making, as well as reviewing the written report by the radiologist.  ED MD interpretation: Pulmonary edema  Official radiology report(s): DG Chest Port 1 View  Result Date: 01/04/2021 CLINICAL DATA:  65 year old male with shortness of breath. EXAM: PORTABLE CHEST 1 VIEW COMPARISON:  Chest radiograph dated 10/18/2020. FINDINGS: Minimal bibasilar atelectasis. No focal consolidation, pleural effusion, or pneumothorax. Stable cardiomegaly with mild central vascular congestion. Atherosclerotic calcification of the aorta. No acute osseous pathology. IMPRESSION: Cardiomegaly with mild central vascular congestion. No focal consolidation. Electronically Signed   By: Elgie CollardArash  Radparvar M.D.   On: 01/04/2021 03:07    ____________________________________________   PROCEDURES  Procedure(s) performed (including Critical Care):  .1-3 Lead EKG Interpretation Performed by: Irean HongSung, Jonda Alanis J, MD Authorized by: Irean HongSung, Alvino Lechuga J, MD     Interpretation: normal     ECG rate:  100   ECG rate assessment: normal     Rhythm: sinus rhythm     Ectopy: none     Conduction: normal   Comments:     Patient placed on cardiac monitor to evaluate for arrhythmias    CRITICAL CARE Performed by: Irean HongSUNG,Jamaira Sherk J   Total critical care time: 45 minutes  Critical care time was exclusive of separately billable procedures and treating other patients.  Critical care was necessary to treat or prevent imminent or life-threatening deterioration.  Critical care was time spent personally by me on the following activities: development of treatment plan with patient and/or surrogate as well as nursing, discussions with  consultants, evaluation of patient's response to treatment, examination of patient, obtaining history from patient or surrogate, ordering and performing treatments and interventions, ordering and review of laboratory studies, ordering and review of radiographic studies, pulse oximetry and re-evaluation of patient's condition.  ____________________________________________   INITIAL IMPRESSION / ASSESSMENT AND PLAN / ED COURSE  As part of my medical decision making, I reviewed the following data within the electronic MEDICAL RECORD NUMBER Nursing notes reviewed and incorporated, Labs reviewed, EKG interpreted, Old chart reviewed, Radiograph reviewed and Notes from prior ED visits     65 year old male presenting with shortness of breath Differential includes, but is not limited to, viral syndrome, bronchitis including COPD exacerbation, pneumonia, reactive airway disease including asthma, CHF including exacerbation  with or without pulmonary/interstitial edema, pneumothorax, ACS, thoracic trauma, and pulmonary embolism.  EMS reports improved aeration after second DuoNeb.  IV Solu-Medrol given prior to arrival.  EMS reports blood sugar high; patient did not take his medications today.  Will obtain lab work, chest x-ray, respiratory panel.  Will reassess.  Clinical Course as of 01/04/21 0415  Thu Jan 04, 2021  0312 Elevated troponin noted.  Will initiate heparin bolus and drip.  Will discuss with hospitalist services for admission. [JS]    Clinical Course User Index [JS] Irean Hong, MD     ____________________________________________   FINAL CLINICAL IMPRESSION(S) / ED DIAGNOSES  Final diagnoses:  COPD exacerbation (HCC)  Acute on chronic congestive heart failure, unspecified heart failure type [facility])  NSTEMI (non-ST elevated myocardial infarction) [facility])  Hyperglycemia     ED Discharge Orders    None      *Please note:  Owais Pruett was evaluated in Emergency Department on  01/04/2021 for the symptoms described in the history of present illness. He was evaluated in the context of the global COVID-19 pandemic, which necessitated consideration that the patient might be at risk for infection with the SARS-CoV-2 virus that causes COVID-19. Institutional protocols and algorithms that pertain to the evaluation of patients at risk for COVID-19 are in a state of rapid change based on information released by regulatory bodies including the CDC and federal and state organizations. These policies and algorithms were followed during the patient's care in the ED.  Some ED evaluations and interventions may be delayed as a result of limited staffing during and the pandemic.*   Note:  This document was prepared using Dragon voice recognition software and may include unintentional dictation errors.   Irean Hong, MD 01/04/21 [phone]

## 2021-01-04 NOTE — ED Notes (Signed)
Hospitalist at bedside 

## 2021-01-04 NOTE — Progress Notes (Signed)
Inpatient Diabetes Program Recommendations  AACE/ADA: New Consensus Statement on Inpatient Glycemic Control (2015)  Target Ranges:  Prepandial:   less than 140 mg/dL      Peak postprandial:   less than 180 mg/dL (1-2 hours)      Critically ill patients:  140 - 180 mg/dL   Lab Results  Component Value Date   GLUCAP 491 (H) 01/04/2021   HGBA1C 5.7 (H) 06/21/2020    Review of Glycemic Control Results for Cody Alexander, Cody Alexander (MRN [id number]) as of 01/04/2021 10:57  Ref. Range 01/04/2021 03:44 01/04/2021 07:23 01/04/2021 10:15  Glucose-Capillary Latest Ref Range: 70 - 99 mg/dL 993 (H) 570 (H) 177 (H)   Diabetes history: DM 2 Outpatient Diabetes medications:  Metformin 1000 mg bid Current orders for Inpatient glycemic control:  Novolog resistant tid with meals and HS Novolog 4 units tid with meals Lantus 15 units daily Inpatient Diabetes Program Recommendations:    Note patient did receive Solumedrol in route to hospital with EMS.  This has likely increased CBG's.  Agree with current orders.  A1C pending.  Will follow.   Thanks,  Beryl Meager, RN, BC-ADM Inpatient Diabetes Coordinator Pager [phone] (8a-5p)

## 2021-01-04 NOTE — Progress Notes (Signed)
ANTICOAGULATION CONSULT NOTE - Initial Consult  Pharmacy Consult for Heparin  Indication: chest pain/ACS  Allergies  Allergen Reactions  . Erythromycin Anaphylaxis and Swelling    Had eye swelling with erythromycin during a time when he had perf ear drum.  Tolerated azithromycin.    Patient Measurements: Height: 6\' 1"  (185.4 cm) Weight: (!) 137.3 kg (302 lb 12.8 oz) IBW/kg (Calculated) : 79.9 Heparin Dosing Weight: 110.7 kg   Vital Signs: Temp: 98.4 F (36.9 C) (05/12 1507) Temp Source: Oral (05/12 1507) BP: 134/78 (05/12 1507) Pulse Rate: 86 (05/12 1507)  Labs: Recent Labs    01/04/21 0220 01/04/21 0359 01/04/21 1002 01/04/21 1658  HGB 13.5  --   --   --   HCT 42.1  --   --   --   PLT 233  --   --   --   APTT 26  --   --   --   LABPROT 12.6  --   --   --   INR 0.9  --   --   --   HEPARINUNFRC  --   --  0.20* 0.34  CREATININE 1.29*  --   --   --   TROPONINIHS 266* 301*  --   --     Estimated Creatinine Clearance: 85.3 mL/min (A) (by C-G formula based on SCr of 1.29 mg/dL (H)).   Medical History: Past Medical History:  Diagnosis Date  . CHF (congestive heart failure) (HCC)   . COPD (chronic obstructive pulmonary disease) (HCC)   . Diabetes mellitus without complication (HCC)   . Hypertension   . Neuropathy     Medications:  Medications Prior to Admission  Medication Sig Dispense Refill Last Dose  . acetaminophen (TYLENOL) 500 MG tablet Take 1,000 mg by mouth every 6 (six) hours as needed.   prn at prn  . amLODipine (NORVASC) 10 MG tablet Take 1 tablet by mouth daily.   Past Week at Unknown time  . aspirin EC 81 MG tablet Take 81 mg by mouth daily. Swallow whole.   Past Week at Unknown time  . atorvastatin (LIPITOR) 40 MG tablet Take 1 tablet by mouth daily as needed.   Past Week at Unknown time  . cefdinir (OMNICEF) 300 MG capsule Take 300 mg by mouth 2 (two) times daily.   Past Week at Unknown time  . furosemide (LASIX) 80 MG tablet Take 80 mg by mouth  daily.   Past Week at Unknown time  . ipratropium (ATROVENT HFA) 17 MCG/ACT inhaler Inhale 1 puff into the lungs every 6 (six) hours as needed.   prn at prn  . ipratropium-albuterol (DUONEB) 0.5-2.5 (3) MG/3ML SOLN Inhale 3 mLs into the lungs every 4 (four) hours as needed.   prn at prn  . isosorbide dinitrate (ISORDIL) 30 MG tablet Take 30 mg by mouth every morning.   Past Week at Unknown time  . ketoconazole (NIZORAL) 2 % cream Apply 1 application topically 2 (two) times daily.   Past Week at Unknown time  . lisinopril (ZESTRIL) 20 MG tablet Take 20 mg by mouth 2 (two) times daily.   Past Week at Unknown time  . metFORMIN (GLUCOPHAGE) 1000 MG tablet Take 1,000 mg by mouth 2 (two) times daily.   Past Week at Unknown time  . metoprolol tartrate (LOPRESSOR) 25 MG tablet Take 25 mg by mouth 2 (two) times daily.   Past Week at Unknown time  . PROAIR HFA 108 (90 Base) MCG/ACT inhaler Inhale  2 puffs into the lungs every 4 (four) hours as needed for wheezing.   prn at prn  . SYMBICORT 160-4.5 MCG/ACT inhaler Inhale 2 puffs into the lungs 2 (two) times daily.   Past Week at Unknown time  . triamcinolone (KENALOG) 0.025 % cream Apply 1 application topically 2 (two) times daily.   Past Week at Unknown time    Assessment: Pharmacy consulted to dose heparin in this 65 year old male admitted with ACS/NSTEMI.  CrCl = 84.9 ml/min No prior anticoag noted.   5/12@1002  HL: 0.2, subtherapeutic  Goal of Therapy:  Heparin level 0.3-0.7 units/ml Monitor platelets by anticoagulation protocol: Yes   Plan:  5/12@1658  HL: 0.34, therapeutic x1. Will continue at current infusion rate of 1750 units/hr Check confirmatory anti-Xa level in 6 hours and daily while on heparin Continue to monitor H&H and platelets  Indiyah Paone A Aubree Doody 01/04/2021,5:33 PM

## 2021-01-05 ENCOUNTER — Inpatient Hospital Stay: Payer: Medicaid Other

## 2021-01-05 DIAGNOSIS — R778 Other specified abnormalities of plasma proteins: Secondary | ICD-10-CM | POA: Diagnosis not present

## 2021-01-05 DIAGNOSIS — I5033 Acute on chronic diastolic (congestive) heart failure: Secondary | ICD-10-CM | POA: Diagnosis not present

## 2021-01-05 DIAGNOSIS — J441 Chronic obstructive pulmonary disease with (acute) exacerbation: Secondary | ICD-10-CM | POA: Diagnosis not present

## 2021-01-05 DIAGNOSIS — R21 Rash and other nonspecific skin eruption: Secondary | ICD-10-CM

## 2021-01-05 DIAGNOSIS — J9621 Acute and chronic respiratory failure with hypoxia: Secondary | ICD-10-CM

## 2021-01-05 DIAGNOSIS — R7989 Other specified abnormal findings of blood chemistry: Secondary | ICD-10-CM

## 2021-01-05 LAB — BASIC METABOLIC PANEL
Anion gap: 9 (ref 5–15)
BUN: 25 mg/dL — ABNORMAL HIGH (ref 8–23)
CO2: 35 mmol/L — ABNORMAL HIGH (ref 22–32)
Calcium: 8.6 mg/dL — ABNORMAL LOW (ref 8.9–10.3)
Chloride: 92 mmol/L — ABNORMAL LOW (ref 98–111)
Creatinine, Ser: 1.09 mg/dL (ref 0.61–1.24)
GFR, Estimated: >60 mL/min (ref >60)
Glucose, Bld: 226 mg/dL — ABNORMAL HIGH (ref 70–99)
Potassium: 4.2 mmol/L (ref 3.5–5.1)
Sodium: 136 mmol/L (ref 135–145)

## 2021-01-05 LAB — HEPATITIS C ANTIBODY: HCV Ab: NONREACTIVE

## 2021-01-05 LAB — GLUCOSE, CAPILLARY
Glucose-Capillary: 223 mg/dL — ABNORMAL HIGH (ref 70–99)
Glucose-Capillary: 247 mg/dL — ABNORMAL HIGH (ref 70–99)
Glucose-Capillary: 140 mg/dL — ABNORMAL HIGH (ref 70–99)
Glucose-Capillary: 159 mg/dL — ABNORMAL HIGH (ref 70–99)

## 2021-01-05 LAB — HEPARIN LEVEL (UNFRACTIONATED): Heparin Unfractionated: 0.36 IU/mL (ref 0.30–0.70)

## 2021-01-05 MED ORDER — ENOXAPARIN SODIUM 40 MG/0.4ML IJ SOSY
40.0000 mg | PREFILLED_SYRINGE | INTRAMUSCULAR | Status: DC
  Administered 2021-01-05 – 2021-01-07 (×3): 40 mg via SUBCUTANEOUS
  Filled 2021-01-05 (×3): qty 0.4

## 2021-01-05 MED ORDER — FUROSEMIDE 40 MG PO TABS
40.0000 mg | ORAL_TABLET | Freq: Two times a day (BID) | ORAL | Status: DC
  Administered 2021-01-05: 40 mg via ORAL
  Filled 2021-01-05: qty 1

## 2021-01-05 MED ORDER — BENZONATATE 100 MG PO CAPS
200.0000 mg | ORAL_CAPSULE | Freq: Three times a day (TID) | ORAL | Status: DC | PRN
  Administered 2021-01-07 – 2021-01-10 (×5): 200 mg via ORAL
  Filled 2021-01-05 (×5): qty 2

## 2021-01-05 MED ORDER — INSULIN GLARGINE 100 UNIT/ML ~~LOC~~ SOLN
24.0000 [IU] | Freq: Every day | SUBCUTANEOUS | Status: DC
  Administered 2021-01-05 – 2021-01-09 (×5): 24 [IU] via SUBCUTANEOUS
  Filled 2021-01-05 (×6): qty 0.24

## 2021-01-05 MED ORDER — GUAIFENESIN ER 600 MG PO TB12
1200.0000 mg | ORAL_TABLET | Freq: Two times a day (BID) | ORAL | Status: DC
  Administered 2021-01-05 – 2021-01-12 (×14): 1200 mg via ORAL
  Filled 2021-01-05 (×14): qty 2

## 2021-01-05 NOTE — Consult Note (Signed)
  Heart Failure Nurse Navigator Note  HFpEF 60 to 65%.  Moderate LVH.  Grade 1 diastolic dysfunction.  Normal right ventricular systolic function.  He presented to the emergency room with complaints of chest pain, shortness of breath, orthopnea and lower extremity edema.  He admitted to not taking his medications for approximately 4 to 5 days due to an antibiotic that he had been placed on and was afraid of interaction.  Comorbidities:  Neri artery disease COPD Diabetes Hypertension Obesity Obstructive sleep apnea not using CPAP  Labs:  Sodium 136, potassium 4.6, chloride 95, CO2 33, BUN 18, creatinine 1.29, BNP 93 Weight is 137.3 Intake 720 mL Output 4150 mL.   Initial meeting with patient today.  Discussed the importance of daily weight and what to report, 2 to 3 pound weight gain overnight or 5 pounds within a week.  Also discussed eating low-sodium diet.  He admits to eating frozen TV dinners mainly because of the convenience.  He states that he does like to cook, encouraged to do more that and using lean meats and fresh and frozen vegetables and fruits.  Discussed limiting fluid intake to 8 cups daily.  Also talked about the heart failure clinic and following up with the appointment already scheduled.  Was also given information about the clinic.  He was also given living with heart failure educational booklet along with low-sodium handout.   Tresa Endo RN CHFN

## 2021-01-05 NOTE — Progress Notes (Signed)
SUBJECTIVE: No further chest pain has some shortness of breath and is getting breathing treatment   Vitals:   01/04/21 2050 01/04/21 2136 01/05/21 0017 01/05/21 0443  BP: (!) 96/57 105/72 118/75 118/79  Pulse: 87 84 64 65  Resp: 20 18 20 18   Temp: 97.8 F (36.6 C) 97.8 F (36.6 C) 97.9 F (36.6 C) 97.7 F (36.5 C)  TempSrc: Oral Oral Oral Oral  SpO2: 95% 94% 99% 98%  Weight:      Height:        Intake/Output Summary (Last 24 hours) at 01/05/2021 0759 Last data filed at 01/05/2021 0450 Gross per 24 hour  Intake 720 ml  Output 4150 ml  Net -3430 ml    LABS: Basic Metabolic Panel: Recent Labs    01/04/21 0220 01/05/21 0409  NA 136 136  K 4.6 4.2  CL 95* 92*  CO2 33* 35*  GLUCOSE 411* 226*  BUN 18 25*  CREATININE 1.29* 1.09  CALCIUM 8.8* 8.6*   Liver Function Tests: Recent Labs    01/04/21 0220  AST 17  ALT 18  ALKPHOS 56  BILITOT 0.8  PROT 6.4*  ALBUMIN 3.6   No results for input(s): LIPASE, AMYLASE in the last 72 hours. CBC: Recent Labs    01/04/21 0220  WBC 12.7*  NEUTROABS 9.3*  HGB 13.5  HCT 42.1  MCV 92.7  PLT 233   Cardiac Enzymes: No results for input(s): CKTOTAL, CKMB, CKMBINDEX, TROPONINI in the last 72 hours. BNP: Invalid input(s): POCBNP D-Dimer: No results for input(s): DDIMER in the last 72 hours. Hemoglobin A1C: Recent Labs    01/04/21 1002  HGBA1C 10.1*   Fasting Lipid Panel: Recent Labs    01/04/21 0359  CHOL 173  HDL 54  LDLCALC 103*  TRIG 82  CHOLHDL 3.2   Thyroid Function Tests: No results for input(s): TSH, T4TOTAL, T3FREE, THYROIDAB in the last 72 hours.  Invalid input(s): FREET3 Anemia Panel: No results for input(s): VITAMINB12, FOLATE, FERRITIN, TIBC, IRON, RETICCTPCT in the last 72 hours.   PHYSICAL EXAM General: Well developed, well nourished, in no acute distress HEENT:  Normocephalic and atramatic Neck:  No JVD.  Lungs: Clear bilaterally to auscultation and percussion. Heart: HRRR . Normal S1 and  S2 without gallops or murmurs.  Abdomen: Bowel sounds are positive, abdomen soft and non-tender  Msk:  Back normal, normal gait. Normal strength and tone for age. Extremities: No clubbing, cyanosis or edema.   Neuro: Alert and oriented X 3. Psych:  Good affect, responds appropriately  TELEMETRY: Sinus rhythm  ASSESSMENT AND PLAN: Atypical chest pain will most likely due to respiratory disease.  Patient has COPD and is getting breathing treatment which is helping.  Elevated troponin due to demand ischemia.  Patient can be discharged with follow-up in my office on Monday at 9 AM.  Active Problems:   NSTEMI (non-ST elevated myocardial infarction) (HCC)   COPD exacerbation (HCC)   Essential hypertension   Uncontrolled type 2 diabetes mellitus with hyperglycemia (HCC)   Acute on chronic diastolic CHF (congestive heart failure) (HCC)   OSA (obstructive sleep apnea)   Obesity (BMI 30-39.9)   Chronic respiratory failure with hypoxia (HCC)    Broady Lafoy A, MD, [facility] 01/05/2021 7:59 AM

## 2021-01-05 NOTE — TOC Initial Note (Signed)
Transition of Care [facility]) - Initial/Assessment Note    Patient Details  Name: Cody Alexander MRN: [id number] Date of Birth: 1955-10-31  Transition of Care [facility]) CM/SW Contact:    Hetty Ely, RN Phone Number: 01/05/2021, 12:12 PM  Clinical Narrative: Spoke with patient, who says he is currently living with an 64yo, however her daughter has moved in and is doing some illegal activities which he can not be involved with due his criminal history. Patient on oxygen, 2L continuously service by [facility]. Able to do ADL's however need assistance lately with putting on clothes. Use cane, however need another one, do not want to use walker at all. Medications are delivered by [facility], however he sometimes will have a friend to pick up meds. Have been looking for a place to live, gets $780 month, everywhere he calls is full or too expensive. I will provide patient with a list of resources. Will continue to track for [facility] needs.                  Expected Discharge Plan: Home w Home Health Services Barriers to Discharge: Continued Medical Work up   Patient Goals and CMS Choice Patient states their goals for this hospitalization and ongoing recovery are:: Living with a 9yo lady, her daughter just moved in and I need to find someplace to stay only get $780 per month.      Expected Discharge Plan and Services Expected Discharge Plan: Home w Home Health Services In-house Referral: Clinical Social Work   Post Acute Care Choice: Home Health Living arrangements for the past 2 months: Single Family Home                             HH Agency: NA (Pending.)        Prior Living Arrangements/Services Living arrangements for the past 2 months: Single Family Home Lives with:: Roommate (Lives with 88yo.) Patient language and need for interpreter reviewed:: Yes Do you feel safe going back to the place where you live?: No   Roommate daughter moved in and she is doing things I don't need to be  around.  Need for Family Participation in Patient Care: No (Comment) Care giver support system in place?: No (comment)   Criminal Activity/Legal Involvement Pertinent to Current Situation/Hospitalization: Yes - Comment as needed (Hisory of criminal activity.)  Activities of Daily Living Home Assistive Devices/Equipment: Cane (specify quad or straight) ADL Screening (condition at time of admission) Patient's cognitive ability adequate to safely complete daily activities?: Yes Is the patient deaf or have difficulty hearing?: No Does the patient have difficulty seeing, even when wearing glasses/contacts?: No Does the patient have difficulty concentrating, remembering, or making decisions?: No Patient able to express need for assistance with ADLs?: Yes Does the patient have difficulty dressing or bathing?: No Independently performs ADLs?: Yes (appropriate for developmental age) Does the patient have difficulty walking or climbing stairs?: No Weakness of Legs: None Weakness of Arms/Hands: None  Permission Sought/Granted                  Emotional Assessment Appearance:: Appears stated age Attitude/Demeanor/Rapport: Ambitious Affect (typically observed): Accepting Orientation: : Oriented to Self,Oriented to Place,Oriented to  Time,Oriented to Situation Alcohol / Substance Use: Not Applicable Psych Involvement: No (comment)  Admission diagnosis:  Hyperglycemia [R73.9] COPD exacerbation (HCC) [J44.1] NSTEMI (non-ST elevated myocardial infarction) (HCC) [I21.4] CHF (congestive heart failure), NYHA class I, acute, diastolic (HCC) [I50.31] Acute  on chronic congestive heart failure, unspecified heart failure type [facility]) [I50.9] Patient Active Problem List   Diagnosis Date Noted  . Uncontrolled type 2 diabetes mellitus with hyperglycemia (HCC) 01/04/2021  . Acute on chronic diastolic CHF (congestive heart failure) (HCC) 01/04/2021  . CHF (congestive heart failure), NYHA class I, acute,  diastolic (HCC) 01/04/2021  . OSA (obstructive sleep apnea) 01/04/2021  . Obesity (BMI 30-39.9) 01/04/2021  . Chronic respiratory failure with hypoxia (HCC) 01/04/2021  . Acute on chronic congestive heart failure (HCC)   . Acute on chronic respiratory failure with hypoxia and hypercapnia (HCC) 06/20/2020  . NSTEMI (non-ST elevated myocardial infarction) (HCC) 02/25/2018  . Obesity, Class III, BMI 40-49.9 (morbid obesity) (HCC) 01/19/2017  . COPD exacerbation (HCC) 05/13/2014  . DM type 2 with diabetic peripheral neuropathy (HCC) 05/13/2014  . Essential hypertension 05/13/2014   PCP:  Mckinley Jewel, FNP Pharmacy:   [facility] - [place], [place] - [address] [place] [address] Phone: [phone] Fax: [phone]     Social Determinants of Health (SDOH) Interventions    Readmission Risk Interventions No flowsheet data found.

## 2021-01-05 NOTE — Progress Notes (Signed)
Patient ID: Cody Alexander, male   DOB: May 10, 1956, 65 y.o.   MRN: [id number] [facility] PROGRESS NOTE  Cody Alexander SWF:[id number] DOB: 1956-04-24 DOA: 01/04/2021 PCP: Mckinley Jewel, FNP  HPI/Subjective: Patient still having a lot of difficulty breathing.  Still coughing continuously.  Asking for Tussionex and I told him it is already ordered as needed.  Having some upper abdominal pain from coughing.  Admitted with COPD exacerbation.  Objective: Vitals:   01/05/21 0759 01/05/21 1118  BP: 126/81 111/67  Pulse: 66 63  Resp: 18 18  Temp: 97.9 F (36.6 C) 98 F (36.7 C)  SpO2: 96% 92%    Intake/Output Summary (Last 24 hours) at 01/05/2021 1437 Last data filed at 01/05/2021 1330 Gross per 24 hour  Intake 960 ml  Output 2450 ml  Net -1490 ml   Filed Weights   01/04/21 0216 01/04/21 1639  Weight: 136.1 kg (!) 137.3 kg    ROS: Review of Systems  Respiratory: Positive for cough, shortness of breath and wheezing.   Cardiovascular: Negative for chest pain.  Gastrointestinal: Positive for abdominal pain.   Exam: Physical Exam HENT:     Head: Normocephalic.     Mouth/Throat:     Pharynx: No oropharyngeal exudate.  Eyes:     General: Lids are normal.     Conjunctiva/sclera: Conjunctivae normal.     Pupils: Pupils are equal, round, and reactive to light.  Cardiovascular:     Rate and Rhythm: Normal rate and regular rhythm.     Heart sounds: Normal heart sounds, S1 normal and S2 normal.  Pulmonary:     Breath sounds: Transmitted upper airway sounds present. Examination of the right-middle field reveals decreased breath sounds and wheezing. Examination of the left-middle field reveals decreased breath sounds and wheezing. Examination of the right-lower field reveals decreased breath sounds and rhonchi. Examination of the left-lower field reveals decreased breath sounds and rhonchi. Decreased breath sounds, wheezing and rhonchi present. No rales.  Abdominal:      General: There is distension.     Palpations: Abdomen is soft.     Tenderness: There is no abdominal tenderness.  Musculoskeletal:     Right lower leg: Swelling present.     Left lower leg: Swelling present.  Skin:    General: Skin is warm.     Findings: Rash present.     Comments: Positive large rash on his back.  Erythema on his chest.  Neurological:     Mental Status: He is alert and oriented to person, place, and time.       Data Reviewed: Basic Metabolic Panel: Recent Labs  Lab 01/04/21 0220 01/05/21 0409  NA 136 136  K 4.6 4.2  CL 95* 92*  CO2 33* 35*  GLUCOSE 411* 226*  BUN 18 25*  CREATININE 1.29* 1.09  CALCIUM 8.8* 8.6*   Liver Function Tests: Recent Labs  Lab 01/04/21 0220  AST 17  ALT 18  ALKPHOS 56  BILITOT 0.8  PROT 6.4*  ALBUMIN 3.6   CBC: Recent Labs  Lab 01/04/21 0220  WBC 12.7*  NEUTROABS 9.3*  HGB 13.5  HCT 42.1  MCV 92.7  PLT 233   BNP (last 3 results) Recent Labs    06/20/20 1505 01/04/21 0220  BNP 136.0* 93.2    CBG: Recent Labs  Lab 01/04/21 1148 01/04/21 1813 01/04/21 2049 01/05/21 0759 01/05/21 1119  GLUCAP 480* 462* 311* 223* 247*    Recent Results (from the past 240 hour(s))  Resp Panel  by RT-PCR (Flu A&B, Covid) Nasopharyngeal Swab     Status: None   Collection Time: 01/04/21  2:21 AM   Specimen: Nasopharyngeal Swab; Nasopharyngeal(NP) swabs in vial transport medium  Result Value Ref Range Status   SARS Coronavirus 2 by RT PCR NEGATIVE NEGATIVE Final    Comment: (NOTE) SARS-CoV-2 target nucleic acids are NOT DETECTED.  The SARS-CoV-2 RNA is generally detectable in upper respiratory specimens during the acute phase of infection. The lowest concentration of SARS-CoV-2 viral copies this assay can detect is 138 copies/mL. A negative result does not preclude SARS-Cov-2 infection and should not be used as the sole basis for treatment or other patient management decisions. A negative result may occur with   improper specimen collection/handling, submission of specimen other than nasopharyngeal swab, presence of viral mutation(s) within the areas targeted by this assay, and inadequate number of viral copies(<138 copies/mL). A negative result must be combined with clinical observations, patient history, and epidemiological information. The expected result is Negative.  Fact Sheet for Patients:  BloggerCourse.com  Fact Sheet for Healthcare Providers:  SeriousBroker.it  This test is no t yet approved or cleared by the [place] FDA and  has been authorized for detection and/or diagnosis of SARS-CoV-2 by FDA under an Emergency Use Authorization (EUA). This EUA will remain  in effect (meaning this test can be used) for the duration of the COVID-19 declaration under Section 564(b)(1) of the Act, 21 U.S.C.section 360bbb-3(b)(1), unless the authorization is terminated  or revoked sooner.       Influenza A by PCR NEGATIVE NEGATIVE Final   Influenza B by PCR NEGATIVE NEGATIVE Final    Comment: (NOTE) The Xpert Xpress SARS-CoV-2/FLU/RSV plus assay is intended as an aid in the diagnosis of influenza from Nasopharyngeal swab specimens and should not be used as a sole basis for treatment. Nasal washings and aspirates are unacceptable for Xpert Xpress SARS-CoV-2/FLU/RSV testing.  Fact Sheet for Patients: BloggerCourse.com  Fact Sheet for Healthcare Providers: SeriousBroker.it  This test is not yet approved or cleared by the [place] FDA and has been authorized for detection and/or diagnosis of SARS-CoV-2 by FDA under an Emergency Use Authorization (EUA). This EUA will remain in effect (meaning this test can be used) for the duration of the COVID-19 declaration under Section 564(b)(1) of the Act, 21 U.S.C. section 360bbb-3(b)(1), unless the authorization is terminated  or revoked.  Performed at [facility], [address] [place], [place] [address]      Studies: [facility] Chest [place] 1 View  Result Date: 01/04/2021 CLINICAL DATA:  65 year old male with shortness of breath. EXAM: PORTABLE CHEST 1 VIEW COMPARISON:  Chest radiograph dated 10/18/2020. FINDINGS: Minimal bibasilar atelectasis. No focal consolidation, pleural effusion, or pneumothorax. Stable cardiomegaly with mild central vascular congestion. Atherosclerotic calcification of the aorta. No acute osseous pathology. IMPRESSION: Cardiomegaly with mild central vascular congestion. No focal consolidation. Electronically Signed   By: Elgie Collard M.D.   On: 01/04/2021 03:07   ECHOCARDIOGRAM COMPLETE  Result Date: 01/04/2021    ECHOCARDIOGRAM REPORT   Patient Name:   SHAQUELLE HERNON Date of Exam: 01/04/2021 Medical Rec #:  [id number]         Height:       73.0 in Accession #:    [id number]        Weight:       300.0 lb Date of Birth:  04-15-1956         BSA:  2.556 m Patient Age:    64 years          BP:           137/78 mmHg Patient Gender: M                 HR:           97 bpm. Exam Location:  [facility] Procedure: 2D Echo, Cardiac Doppler and Color Doppler Indications:     CHF- acute diastolic I50.31  History:         Patient has prior history of Echocardiogram examinations, most                  recent 02/25/2018. CHF, COPD; Risk Factors:Hypertension.  Sonographer:     Cristela Blue RDCS (AE) Referring Phys:  [id number] Alford Highland Diagnosing Phys: Adrian Blackwater MD  Sonographer Comments: Technically difficult study due to poor echo windows, no apical window and no subcostal window. Image acquisition challenging due to COPD. IMPRESSIONS  1. Left ventricular ejection fraction, by estimation, is 60 to 65%. The left ventricle has normal function. The left ventricle has no regional wall motion abnormalities. There is moderate left ventricular hypertrophy. Left ventricular diastolic parameters are consistent  with Grade I diastolic dysfunction (impaired relaxation).  2. Right ventricular systolic function is normal. The right ventricular size is normal.  3. Left atrial size was mildly dilated.  4. Right atrial size was mildly dilated.  5. The mitral valve is normal in structure. No evidence of mitral valve regurgitation. No evidence of mitral stenosis.  6. The aortic valve is normal in structure. Aortic valve regurgitation is not visualized. No aortic stenosis is present.  7. The inferior vena cava is normal in size with greater than 50% respiratory variability, suggesting right atrial pressure of 3 mmHg. FINDINGS  Left Ventricle: Left ventricular ejection fraction, by estimation, is 60 to 65%. The left ventricle has normal function. The left ventricle has no regional wall motion abnormalities. The left ventricular internal cavity size was normal in size. There is  moderate left ventricular hypertrophy. Left ventricular diastolic parameters are consistent with Grade I diastolic dysfunction (impaired relaxation). Right Ventricle: The right ventricular size is normal. No increase in right ventricular wall thickness. Right ventricular systolic function is normal. Left Atrium: Left atrial size was mildly dilated. Right Atrium: Right atrial size was mildly dilated. Pericardium: There is no evidence of pericardial effusion. Mitral Valve: The mitral valve is normal in structure. No evidence of mitral valve regurgitation. No evidence of mitral valve stenosis. Tricuspid Valve: The tricuspid valve is normal in structure. Tricuspid valve regurgitation is not demonstrated. No evidence of tricuspid stenosis. Aortic Valve: The aortic valve is normal in structure. Aortic valve regurgitation is not visualized. No aortic stenosis is present. Pulmonic Valve: The pulmonic valve was normal in structure. Pulmonic valve regurgitation is not visualized. No evidence of pulmonic stenosis. Aorta: The aortic root is normal in size and structure.  Venous: The inferior vena cava is normal in size with greater than 50% respiratory variability, suggesting right atrial pressure of 3 mmHg. IAS/Shunts: No atrial level shunt detected by color flow Doppler.  LEFT VENTRICLE PLAX 2D LVIDd:         4.92 cm LVIDs:         2.82 cm LV PW:         1.66 cm LV IVS:        2.13 cm LVOT diam:     2.30 cm LVOT Area:  4.15 cm  LEFT ATRIUM         Index LA diam:    3.40 cm 1.33 cm/m                        PULMONIC VALVE AORTA                 PV Vmax:        0.92 m/s Ao Root diam: 4.07 cm PV Peak grad:   3.4 mmHg                       RVOT Peak grad: 3 mmHg  TRICUSPID VALVE TR Peak grad:   13.2 mmHg TR Vmax:        182.00 cm/s  SHUNTS Systemic Diam: 2.30 cm Adrian Blackwater MD Electronically signed by Adrian Blackwater MD Signature Date/Time: 01/04/2021/2:01:24 PM    Final     Scheduled Meds: . acetylcysteine  3 mL Nebulization BID  . aspirin EC  81 mg Oral Daily  . atorvastatin  40 mg Oral Daily  . budesonide (PULMICORT) nebulizer solution  0.5 mg Nebulization BID  . doxycycline  100 mg Oral Q12H  . enoxaparin (LOVENOX) injection  40 mg Subcutaneous Q24H  . furosemide  60 mg Intravenous BID  . insulin aspart  0-20 Units Subcutaneous TID WC  . insulin aspart  0-5 Units Subcutaneous QHS  . insulin aspart  4 Units Subcutaneous TID WC  . insulin glargine  24 Units Subcutaneous Daily  . ipratropium-albuterol  3 mL Nebulization TID  . lisinopril  20 mg Oral BID  . metoprolol tartrate  25 mg Oral BID  . spironolactone  12.5 mg Oral Daily  . triamcinolone cream   Topical TID    Assessment/Plan:  1. COPD exacerbation.  Patient received IV Solu-Medrol yesterday via EMS.  Sugars were very high yesterday.  Dosing Solu-Medrol daily secondary to high sugars.  Continue Mucomyst budesonide and DuoNeb nebulizer solution.  On doxycycline.  Tussionex. 2. Acute on chronic hypoxic respiratory failure.  Patient wears 2 L chronically.  They increased him up to 6 L.  Would rather have  him more hypoxic I am okay with pulse ox of 88 to 92%.  Currently down to 4 L oxygen. 3. Acute on chronic diastolic congestive heart failure.  Switch IV Lasix over to oral Lasix.  Continue lisinopril, metoprolol and Imdur.  We will change metoprolol over to bisoprolol for tomorrow. 4. Elevated troponin likely demand ischemia rather than NSTEMI.  Discontinue heparin drip.  Continue aspirin and beta-blocker. 5. Uncontrolled type 2 diabetes mellitus with hyperglycemia.  Increase Lantus to 24 units.  Continue sliding scale insulin.  Hemoglobin A1c elevated at 10.1. 6. Rash on back.  Given triamcinolone cream.  We will send off CPK and aldolase tomorrow morning. 7. Patient states that he has a history of hepatitis C and I will check a hepatitis C antibody. 8. Obesity with a BMI of 39.95        Code Status:     Code Status Orders  (From admission, onward)         Start     Ordered   01/04/21 0339  Full code  Continuous        01/04/21 0347        Code Status History    Date Active Date Inactive Code Status Order ID Comments User Context   06/20/2020 1959 06/25/2020 0032 Full Code [id number]  Mikeal Hawthorne,  Cheri RousMohammad L, MD ED   02/25/2018 1608 02/28/2018 0724 Full Code [id number]  Katha HammingKonidena, Snehalatha, MD ED   Advance Care Planning Activity     Family Communication: Declined  disposition Plan: Status is: Inpatient  Dispo: The patient is from: Home              Anticipated d/c is to: Home              Patient currently still with a lot of bronchospasm and continues to need Solu-Medrol but limited on dosing secondary to high sugars.   Difficult to place patient.  No.  Consultants:  Cardiology signed off  Antibiotics:  Doxycycline  Time spent: 28 minutes  Lashea Goda [employer]  [facility]

## 2021-01-05 NOTE — Progress Notes (Addendum)
Inpatient Diabetes Program Recommendations  AACE/ADA: New Consensus Statement on Inpatient Glycemic Control   Target Ranges:  Prepandial:   less than 140 mg/dL      Peak postprandial:   less than 180 mg/dL (1-2 hours)      Critically ill patients:  140 - 180 mg/dL   Results for Cody Alexander, Cody Alexander (MRN [id number]) as of 01/05/2021 10:22  Ref. Range 01/04/2021 07:23 01/04/2021 10:15 01/04/2021 11:48 01/04/2021 18:13 01/04/2021 20:49 01/05/2021 07:59  Glucose-Capillary Latest Ref Range: 70 - 99 mg/dL 778 (H) 242 (H) 353 (H) 462 (H) 311 (H) 223 (H)  Results for Cody Alexander (MRN [id number]) as of 01/05/2021 10:22  Ref. Range 06/21/2020 05:24 01/04/2021 10:02  Hemoglobin A1C Latest Ref Range: 4.8 - 5.6 % 5.7 (H) 10.1 (H)   Review of Glycemic Control  Diabetes history: DM2 Outpatient Diabetes medications: Metformin 1000 mg BID Current orders for Inpatient glycemic control: Lantus 24 units daily, Novolog 0-20 units TID with meals, Novolog 0-5 units QHS, Novolog 4 units TID with meals  Inpatient Diabetes Program Recommendations:    Insulin: Noted Lantus increased from 15 to 24 units daily today.   HbgA1C: A1C 10.1% on 01/04/21 indicating an average glucose of 243 mg/dl to evaluate glycemic control over the past 2-3 months.   NOTE: In reviewing chart, noted patient use to be on Lantus in the past. Only Metformin listed on home medication list currently. Spoke with patient over the phone to inquire about DM medications and control. Patient states that he is only taking Metformin 1000 mg BID for DM control and states that his glucose usually runs okay but he notes that he has not taken any medications for the past 5-6 days due to being sick. Inquired about any prior use of insulin and patient states that he use to be on Lantus (prefers insulin pens) and last took insulin 9-10 months ago. Patient states the Metformin keeps his glucose fairly controlled usually and he notes that he has not been doing well  with following a carb modified diet lately. Discussed A1C 10.1% indicating glucose average 243 mg/dl over the past 2-3 months. Explained that even if he has not taken any Metformin for past 5-6 days, his A1C would not be that elevated. Explained that he likely needs to be on additional DM medications outpatient. Patient states he would prefer to use additional oral DM medication if possible versus going back on insulin. He states that he would take the insulin again if he had to (would prefer insulin pens) but would really prefer oral DM medication. Patient states that he has not been seeing his PCP routinely and he plans to start going routinely and following a carb modified diet. Patient states he has all need supplies for glucose monitoring. Patient stated that he would like to talk to [facility] about helping him find somewhere to stay; he stated he was living with a lady friend and her daughter recently moved it and it was not a good situation and the daughter was doing things he could not be around.  Ordered TOC consult. Patient also states that he needs something for his cough and congestion and states that Tussionex usually works well for him and he would like to see if provider will prescribe. He reports that he has been coughing so much and so hard that he has felt like he was going to pass out several times since admitted; he states he has not told his nurses about it. Encouraged patient to be sure  to let nursing staff know how he is feeling so they can communicate with the providers following while inpatient. Patient verbalized understanding of information discussed and states that he has no questions at this time. Of note, patient was coughing excessively during conversation and had to stop talking several times to cough and catch his breath. Will communicate with Dr. Renae Gloss regarding patient conversation and request for Tussionex.  Thanks, Orlando Penner, RN, MSN, CDE Diabetes Coordinator Inpatient Diabetes  Program [phone] (Team Pager from 8am to 5pm)

## 2021-01-05 NOTE — Progress Notes (Signed)
ANTICOAGULATION CONSULT NOTE - Initial Consult  Pharmacy Consult for Heparin  Indication: chest pain/ACS  Allergies  Allergen Reactions  . Erythromycin Anaphylaxis and Swelling    Had eye swelling with erythromycin during a time when he had perf ear drum.  Tolerated azithromycin.    Patient Measurements: Height: 6\' 1"  (185.4 cm) Weight: (!) 137.3 kg (302 lb 12.8 oz) IBW/kg (Calculated) : 79.9 Heparin Dosing Weight: 110.7 kg   Vital Signs: Temp: 97.7 F (36.5 C) (05/13 0443) Temp Source: Oral (05/13 0443) BP: 118/79 (05/13 0443) Pulse Rate: 65 (05/13 0443)  Labs: Recent Labs    01/04/21 0220 01/04/21 0359 01/04/21 1002 01/04/21 1658 01/04/21 2252 01/05/21 0409  HGB 13.5  --   --   --   --   --   HCT 42.1  --   --   --   --   --   PLT 233  --   --   --   --   --   APTT 26  --   --   --   --   --   LABPROT 12.6  --   --   --   --   --   INR 0.9  --   --   --   --   --   HEPARINUNFRC  --   --    < > 0.34 0.35 0.36  CREATININE 1.29*  --   --   --   --  1.09  TROPONINIHS 266* 301*  --   --   --   --    < > = values in this interval not displayed.    Estimated Creatinine Clearance: 101 mL/min (by C-G formula based on SCr of 1.09 mg/dL).   Medical History: Past Medical History:  Diagnosis Date  . CHF (congestive heart failure) (HCC)   . COPD (chronic obstructive pulmonary disease) (HCC)   . Diabetes mellitus without complication (HCC)   . Hypertension   . Neuropathy     Medications:  Medications Prior to Admission  Medication Sig Dispense Refill Last Dose  . acetaminophen (TYLENOL) 500 MG tablet Take 1,000 mg by mouth every 6 (six) hours as needed.   prn at prn  . amLODipine (NORVASC) 10 MG tablet Take 1 tablet by mouth daily.   Past Week at Unknown time  . aspirin EC 81 MG tablet Take 81 mg by mouth daily. Swallow whole.   Past Week at Unknown time  . atorvastatin (LIPITOR) 40 MG tablet Take 1 tablet by mouth daily as needed.   Past Week at Unknown time  .  cefdinir (OMNICEF) 300 MG capsule Take 300 mg by mouth 2 (two) times daily.   Past Week at Unknown time  . furosemide (LASIX) 80 MG tablet Take 80 mg by mouth daily.   Past Week at Unknown time  . ipratropium (ATROVENT HFA) 17 MCG/ACT inhaler Inhale 1 puff into the lungs every 6 (six) hours as needed.   prn at prn  . ipratropium-albuterol (DUONEB) 0.5-2.5 (3) MG/3ML SOLN Inhale 3 mLs into the lungs every 4 (four) hours as needed.   prn at prn  . isosorbide dinitrate (ISORDIL) 30 MG tablet Take 30 mg by mouth every morning.   Past Week at Unknown time  . ketoconazole (NIZORAL) 2 % cream Apply 1 application topically 2 (two) times daily.   Past Week at Unknown time  . lisinopril (ZESTRIL) 20 MG tablet Take 20 mg by mouth 2 (two) times daily.  Past Week at Unknown time  . metFORMIN (GLUCOPHAGE) 1000 MG tablet Take 1,000 mg by mouth 2 (two) times daily.   Past Week at Unknown time  . metoprolol tartrate (LOPRESSOR) 25 MG tablet Take 25 mg by mouth 2 (two) times daily.   Past Week at Unknown time  . PROAIR HFA 108 (90 Base) MCG/ACT inhaler Inhale 2 puffs into the lungs every 4 (four) hours as needed for wheezing.   prn at prn  . SYMBICORT 160-4.5 MCG/ACT inhaler Inhale 2 puffs into the lungs 2 (two) times daily.   Past Week at Unknown time  . triamcinolone (KENALOG) 0.025 % cream Apply 1 application topically 2 (two) times daily.   Past Week at Unknown time    Assessment: Pharmacy consulted to dose heparin in this 65 year old male admitted with ACS/NSTEMI.  CrCl = 84.9 ml/min No prior anticoag noted.   5/12@1002  HL: 0.2, subtherapeutic  Goal of Therapy:  Heparin level 0.3-0.7 units/ml Monitor platelets by anticoagulation protocol: Yes   Plan:  5/12: HL @ 2252 = 0.35, therapeutic X 2  Will continue pt on current rate and recheck HL on 5/13 @ 0500.   5/13: HL @ 0409 = 0.36, therapeutic X 3  Will continue pt on current rate and recheck HL on 5/14 @ 0500.   Jessilynn Taft D 01/05/2021,5:36  AM

## 2021-01-06 DIAGNOSIS — I5033 Acute on chronic diastolic (congestive) heart failure: Secondary | ICD-10-CM | POA: Diagnosis not present

## 2021-01-06 DIAGNOSIS — J9621 Acute and chronic respiratory failure with hypoxia: Secondary | ICD-10-CM | POA: Diagnosis not present

## 2021-01-06 DIAGNOSIS — R778 Other specified abnormalities of plasma proteins: Secondary | ICD-10-CM | POA: Diagnosis not present

## 2021-01-06 DIAGNOSIS — J441 Chronic obstructive pulmonary disease with (acute) exacerbation: Secondary | ICD-10-CM | POA: Diagnosis not present

## 2021-01-06 LAB — BASIC METABOLIC PANEL
Anion gap: 7 (ref 5–15)
BUN: 27 mg/dL — ABNORMAL HIGH (ref 8–23)
CO2: 35 mmol/L — ABNORMAL HIGH (ref 22–32)
Calcium: 8.5 mg/dL — ABNORMAL LOW (ref 8.9–10.3)
Chloride: 93 mmol/L — ABNORMAL LOW (ref 98–111)
Creatinine, Ser: 1.02 mg/dL (ref 0.61–1.24)
GFR, Estimated: >60 mL/min (ref >60)
Glucose, Bld: 181 mg/dL — ABNORMAL HIGH (ref 70–99)
Potassium: 4.2 mmol/L (ref 3.5–5.1)
Sodium: 135 mmol/L (ref 135–145)

## 2021-01-06 LAB — GLUCOSE, CAPILLARY
Glucose-Capillary: 288 mg/dL — ABNORMAL HIGH (ref 70–99)
Glucose-Capillary: 221 mg/dL — ABNORMAL HIGH (ref 70–99)
Glucose-Capillary: 149 mg/dL — ABNORMAL HIGH (ref 70–99)
Glucose-Capillary: 214 mg/dL — ABNORMAL HIGH (ref 70–99)

## 2021-01-06 LAB — CK: Total CK: 151 U/L (ref 49–397)

## 2021-01-06 MED ORDER — FUROSEMIDE 10 MG/ML IJ SOLN
40.0000 mg | Freq: Two times a day (BID) | INTRAMUSCULAR | Status: DC
  Administered 2021-01-06 – 2021-01-08 (×6): 40 mg via INTRAVENOUS
  Filled 2021-01-06 (×6): qty 4

## 2021-01-06 MED ORDER — FLUCONAZOLE 100 MG PO TABS
100.0000 mg | ORAL_TABLET | Freq: Every day | ORAL | Status: DC
  Administered 2021-01-06 – 2021-01-11 (×6): 100 mg via ORAL
  Filled 2021-01-06 (×7): qty 1

## 2021-01-06 MED ORDER — BISOPROLOL FUMARATE 5 MG PO TABS
5.0000 mg | ORAL_TABLET | Freq: Every day | ORAL | Status: DC
  Administered 2021-01-07 – 2021-01-09 (×3): 5 mg via ORAL
  Filled 2021-01-06 (×3): qty 1

## 2021-01-06 MED ORDER — KETOCONAZOLE 2 % EX CREA
TOPICAL_CREAM | Freq: Two times a day (BID) | CUTANEOUS | Status: DC
  Filled 2021-01-06 (×4): qty 15

## 2021-01-06 NOTE — Progress Notes (Signed)
SUBJECTIVE: No chest pain has still some shortness of breath   Vitals:   01/06/21 0821 01/06/21 0824 01/06/21 1142 01/06/21 1403  BP: 131/87  114/69   Pulse: 75  63 78  Resp: 18  20   Temp: 97.7 F (36.5 C)  (!) 97.5 F (36.4 C)   TempSrc: Oral  Oral   SpO2: 92%  95% 97%  Weight:  (!) 136.6 kg    Height:        Intake/Output Summary (Last 24 hours) at 01/06/2021 1431 Last data filed at 01/06/2021 1146 Gross per 24 hour  Intake 840 ml  Output 2475 ml  Net -1635 ml    LABS: Basic Metabolic Panel: Recent Labs    01/05/21 0409 01/06/21 0450  NA 136 135  K 4.2 4.2  CL 92* 93*  CO2 35* 35*  GLUCOSE 226* 181*  BUN 25* 27*  CREATININE 1.09 1.02  CALCIUM 8.6* 8.5*   Liver Function Tests: Recent Labs    01/04/21 0220  AST 17  ALT 18  ALKPHOS 56  BILITOT 0.8  PROT 6.4*  ALBUMIN 3.6   No results for input(s): LIPASE, AMYLASE in the last 72 hours. CBC: Recent Labs    01/04/21 0220  WBC 12.7*  NEUTROABS 9.3*  HGB 13.5  HCT 42.1  MCV 92.7  PLT 233   Cardiac Enzymes: Recent Labs    01/06/21 0450  CKTOTAL 151   BNP: Invalid input(s): POCBNP D-Dimer: No results for input(s): DDIMER in the last 72 hours. Hemoglobin A1C: Recent Labs    01/04/21 1002  HGBA1C 10.1*   Fasting Lipid Panel: Recent Labs    01/04/21 0359  CHOL 173  HDL 54  LDLCALC 103*  TRIG 82  CHOLHDL 3.2   Thyroid Function Tests: No results for input(s): TSH, T4TOTAL, T3FREE, THYROIDAB in the last 72 hours.  Invalid input(s): FREET3 Anemia Panel: No results for input(s): VITAMINB12, FOLATE, FERRITIN, TIBC, IRON, RETICCTPCT in the last 72 hours.   PHYSICAL EXAM General: Well developed, well nourished, in no acute distress HEENT:  Normocephalic and atramatic Neck:  No JVD.  Lungs: Clear bilaterally to auscultation and percussion. Heart: HRRR . Normal S1 and S2 without gallops or murmurs.  Abdomen: Bowel sounds are positive, abdomen soft and non-tender  Msk:  Back normal,  normal gait. Normal strength and tone for age. Extremities: No clubbing, cyanosis or edema.   Neuro: Alert and oriented X 3. Psych:  Good affect, responds appropriately  TELEMETRY: Sinus rhythm  ASSESSMENT AND PLAN: Chest pain most likely due to COPD exacerbation with elevated troponin secondary to demand ischemia.  Cardiac point of view can see the patient next week probably Tuesday at 10:00.  Active Problems:   NSTEMI (non-ST elevated myocardial infarction) (HCC)   COPD exacerbation (HCC)   Essential hypertension   Acute on chronic respiratory failure with hypoxia (HCC)   Uncontrolled type 2 diabetes mellitus with hyperglycemia (HCC)   Acute on chronic diastolic CHF (congestive heart failure) (HCC)   OSA (obstructive sleep apnea)   Obesity (BMI 30-39.9)   Chronic respiratory failure with hypoxia (HCC)   Elevated troponin   Rash    Cody Alexander A, MD, [facility] 01/06/2021 2:31 PM

## 2021-01-06 NOTE — Progress Notes (Signed)
Patient ID: Cody Alexander, male   DOB: Jan 22, 1956, 65 y.o.   MRN: [id number] [facility] PROGRESS NOTE  Cody Alexander OHY:[id number] DOB: [birth year] DOA: 01/04/2021 PCP: Mckinley Jewel, FNP  HPI/Subjective: Patient still coughing, wheezing and short of breath.  Admitted with COPD exacerbation.  Still not feeling well.  Having sweating episodes.  Patient states that the rash on his back is fungal and has taken Lamisil orally before.  Patient asking for some new shoes.  Objective: Vitals:   01/06/21 0821 01/06/21 1142  BP: 131/87 114/69  Pulse: 75 63  Resp: 18 20  Temp: 97.7 F (36.5 C) (!) 97.5 F (36.4 C)  SpO2: 92% 95%    Intake/Output Summary (Last 24 hours) at 01/06/2021 1401 Last data filed at 01/06/2021 1146 Gross per 24 hour  Intake 840 ml  Output 2475 ml  Net -1635 ml   Filed Weights   01/04/21 1639 01/06/21 0500 01/06/21 0824  Weight: (!) 137.3 kg (!) 136.2 kg (!) 136.6 kg    ROS: Review of Systems  Constitutional: Positive for diaphoresis.  Respiratory: Positive for cough, shortness of breath and wheezing.   Cardiovascular: Negative for chest pain.  Gastrointestinal: Negative for abdominal pain, nausea and vomiting.  Skin: Positive for rash.   Exam: Physical Exam HENT:     Head: Normocephalic.     Mouth/Throat:     Pharynx: No oropharyngeal exudate.  Eyes:     General: Lids are normal.     Conjunctiva/sclera: Conjunctivae normal.     Pupils: Pupils are equal, round, and reactive to light.  Cardiovascular:     Rate and Rhythm: Normal rate and regular rhythm.     Heart sounds: Normal heart sounds, S1 normal and S2 normal.  Pulmonary:     Breath sounds: Transmitted upper airway sounds present. Examination of the right-middle field reveals decreased breath sounds and wheezing. Examination of the left-middle field reveals decreased breath sounds and wheezing. Examination of the right-lower field reveals decreased breath sounds and wheezing.  Examination of the left-lower field reveals decreased breath sounds and wheezing. Decreased breath sounds and wheezing present. No rhonchi or rales.  Abdominal:     Palpations: Abdomen is soft.     Tenderness: There is no abdominal tenderness.  Skin:    General: Skin is warm.     Findings: Rash present.     Comments: Erythematous rash on his back.  Neurological:     Mental Status: He is alert and oriented to person, place, and time.       Data Reviewed: Basic Metabolic Panel: Recent Labs  Lab 01/04/21 0220 01/05/21 0409 01/06/21 0450  NA 136 136 135  K 4.6 4.2 4.2  CL 95* 92* 93*  CO2 33* 35* 35*  GLUCOSE 411* 226* 181*  BUN 18 25* 27*  CREATININE 1.29* 1.09 1.02  CALCIUM 8.8* 8.6* 8.5*   Liver Function Tests: Recent Labs  Lab 01/04/21 0220  AST 17  ALT 18  ALKPHOS 56  BILITOT 0.8  PROT 6.4*  ALBUMIN 3.6   CBC: Recent Labs  Lab 01/04/21 0220  WBC 12.7*  NEUTROABS 9.3*  HGB 13.5  HCT 42.1  MCV 92.7  PLT 233   Cardiac Enzymes: Recent Labs  Lab 01/06/21 0450  CKTOTAL 151   BNP (last 3 results) Recent Labs    06/20/20 1505 01/04/21 0220  BNP 136.0* 93.2    CBG: Recent Labs  Lab 01/05/21 1119 01/05/21 1629 01/05/21 2105 01/06/21 0823 01/06/21 1143  GLUCAP 247*  140* 159* 288* 221*    Recent Results (from the past 240 hour(s))  Resp Panel by RT-PCR (Flu A&B, Covid) Nasopharyngeal Swab     Status: None   Collection Time: 01/04/21  2:21 AM   Specimen: Nasopharyngeal Swab; Nasopharyngeal(NP) swabs in vial transport medium  Result Value Ref Range Status   SARS Coronavirus 2 by RT PCR NEGATIVE NEGATIVE Final    Comment: (NOTE) SARS-CoV-2 target nucleic acids are NOT DETECTED.  The SARS-CoV-2 RNA is generally detectable in upper respiratory specimens during the acute phase of infection. The lowest concentration of SARS-CoV-2 viral copies this assay can detect is 138 copies/mL. A negative result does not preclude SARS-Cov-2 infection and  should not be used as the sole basis for treatment or other patient management decisions. A negative result may occur with  improper specimen collection/handling, submission of specimen other than nasopharyngeal swab, presence of viral mutation(s) within the areas targeted by this assay, and inadequate number of viral copies(<138 copies/mL). A negative result must be combined with clinical observations, patient history, and epidemiological information. The expected result is Negative.  Fact Sheet for Patients:  BloggerCourse.com  Fact Sheet for Healthcare Providers:  SeriousBroker.it  This test is no t yet approved or cleared by the [place] FDA and  has been authorized for detection and/or diagnosis of SARS-CoV-2 by FDA under an Emergency Use Authorization (EUA). This EUA will remain  in effect (meaning this test can be used) for the duration of the COVID-19 declaration under Section 564(b)(1) of the Act, 21 U.S.C.section 360bbb-3(b)(1), unless the authorization is terminated  or revoked sooner.       Influenza A by PCR NEGATIVE NEGATIVE Final   Influenza B by PCR NEGATIVE NEGATIVE Final    Comment: (NOTE) The Xpert Xpress SARS-CoV-2/FLU/RSV plus assay is intended as an aid in the diagnosis of influenza from Nasopharyngeal swab specimens and should not be used as a sole basis for treatment. Nasal washings and aspirates are unacceptable for Xpert Xpress SARS-CoV-2/FLU/RSV testing.  Fact Sheet for Patients: BloggerCourse.com  Fact Sheet for Healthcare Providers: SeriousBroker.it  This test is not yet approved or cleared by the [place] FDA and has been authorized for detection and/or diagnosis of SARS-CoV-2 by FDA under an Emergency Use Authorization (EUA). This EUA will remain in effect (meaning this test can be used) for the duration of the COVID-19 declaration  under Section 564(b)(1) of the Act, 21 U.S.C. section 360bbb-3(b)(1), unless the authorization is terminated or revoked.  Performed at [facility], [address] [place], [place] [address]      Studies: [facility] Chest [place] 1 View  Result Date: 01/05/2021 CLINICAL DATA:  Dyspnea. EXAM: PORTABLE CHEST 1 VIEW COMPARISON:  Radiograph yesterday. FINDINGS: Stable cardiomegaly. Unchanged vascular congestion. Aortic atherosclerosis. No new airspace disease. No pleural fluid or pneumothorax. Stable osseous structures. IMPRESSION: Unchanged cardiomegaly and vascular congestion. Electronically Signed   By: Narda Rutherford M.D.   On: 01/05/2021 23:29    Scheduled Meds: . acetylcysteine  3 mL Nebulization BID  . aspirin EC  81 mg Oral Daily  . atorvastatin  40 mg Oral Daily  . budesonide (PULMICORT) nebulizer solution  0.5 mg Nebulization BID  . doxycycline  100 mg Oral Q12H  . enoxaparin (LOVENOX) injection  40 mg Subcutaneous Q24H  . fluconazole  100 mg Oral Daily  . furosemide  40 mg Intravenous BID  . guaiFENesin  1,200 mg Oral BID  . insulin aspart  0-20 Units Subcutaneous TID WC  .  insulin aspart  0-5 Units Subcutaneous QHS  . insulin aspart  4 Units Subcutaneous TID WC  . insulin glargine  24 Units Subcutaneous Daily  . ipratropium-albuterol  3 mL Nebulization TID  . lisinopril  20 mg Oral BID  . metoprolol tartrate  25 mg Oral BID  . spironolactone  12.5 mg Oral Daily  . triamcinolone cream   Topical TID    Assessment/Plan:  1. COPD exacerbation.  Continue daily Solu-Medrol secondary to sugars being high for twice a day dosing.  Continue Mucomyst, budesonide and DuoNeb nebulizer solution.  Continue doxycycline and Tussionex.  Still with diffuse wheeze with starting to move a little bit better air. 2. Acute on chronic hypoxic respiratory failure.  Patient normally wears 2 L nasal cannula chronically.  Again when I walked in the room he was up at 6 L.  I dialed him down to 4  L.  Rather have a pulse ox on the lower side. 3. Acute on chronic diastolic congestive heart failure.  EF 60 to 65%.  Switch back to IV Lasix with chest x-ray last night still showing congestion.  Continue lisinopril, bisoprolol and Imdur. 4. Elevated troponin likely demand ischemia rather than NSTEMI.  Continue aspirin and beta-blocker.  Cardiology signed off. 5. Uncontrolled type 2 diabetes mellitus with hyperglycemia.  Continue Lantus 24 units and sliding scale insulin.  Hemoglobin A1c elevated at 10.1. 6. Rash on back.  Patient states it is fungal in nature.  Start ketoconazole cream and oral fluconazole. 7. Hepatitis C antibody negative 8. Obesity with a BMI of 39.74        Code Status:     Code Status Orders  (From admission, onward)         Start     Ordered   01/04/21 0339  Full code  Continuous        01/04/21 0347        Code Status History    Date Active Date Inactive Code Status Order ID Comments User Context   06/20/2020 1959 06/25/2020 0032 Full Code [id number]  Rometta Emery, MD ED   02/25/2018 1608 02/28/2018 0724 Full Code [id number]  Katha Hamming, MD ED   Advance Care Planning Activity     Family Communication: Declined Disposition Plan: Status is: Inpatient  Dispo: The patient is from: Home              Anticipated d/c is to: Home              Patient currently still with diffuse wheeze.  Still needs IV medications   Difficult to place patient.  No.  Consultants:  Cardiology signed off  Antibiotics:  Doxycycline  Time spent: 27 minutes  [employer]

## 2021-01-06 NOTE — Progress Notes (Signed)
Physical Therapy Treatment Patient Details Name: Cody Alexander MRN: [id number] DOB: 1955-12-22 Today's Date: 01/06/2021    History of Present Illness 65 y.o. male with medical history significant for COPD on home O2 at 2 L, obesity, OSA, HTN, mild CAD on cardiac cath 02/2018, diastolic heart failure, last EF 55% November 2021, hospitalized in February in [place], for CHF exacerbation with NSTEMI, who presents by EMS with shortness of breath and wheezing that started the day prior.  Pt also recently treated for UTI, apparently had stopped taking antibiotics.    PT Comments    Pt tolerated treatment well today, but further mobility limited secondary to fatigue. Pt able to improve overall ambulation distance, but required multiple standing rest breaks due to fatigue. Pt able to maintain SpO2 >90% throughout gait on 4L, but demonstrated delayed desaturation to 80% while seated EOB post ambulation; pt able to recover quickly with cues for PLB. While pt is making progress towards goals he continues to be limited with meeting goals secondary to decreased activity tolerance, despite increased RR, desaturation, and RPE of 2-3/10 indicating "light activity." Pt will continue to benefit from skilled acute PT services to address deficits for return to baseline function. Will continue to recommend HHPT at DC with bariatric [facility] for energy conservation and safety with mobility.     Follow Up Recommendations  Home health PT (discussed cardiac rehab, feels he would struggle to actually make it in)     Equipment Recommendations  Gilmer Mor (pt adamant he will not use a walker)    Recommendations for Other Services       Precautions / Restrictions Precautions Precautions: Fall Restrictions Other Position/Activity Restrictions: O2 >/= 92%    Mobility  Bed Mobility Overal bed mobility: Modified Independent             General bed mobility comments: Mod I with increased reliance on UE support and  momentum to achieve sitting at EOB    Transfers   Equipment used: Straight cane             General transfer comment: Mod I to perform STS from EOB with SPC in R hand. Pt demonstrates wide BOS and increased reliance on momentum to achieve mobility. Pt demonstrates "poor" eccentric control when sitting EOB.  Ambulation/Gait Ambulation/Gait assistance: Min guard Gait Distance (Feet): 150 Feet (77ft x1 (standing rest break), 31ft x2 with standing rest break) Assistive device: Straight cane       General Gait Details: CGA for safety to ambulate multiple bouts with x2 standing rest breaks. Pt required 4L O2 via [place] and able to maintain >90% throughout. Pt with increased RR and desat after ambulation to 80% on 4L and with RR >30 breaths/min. Pt able to recover quickly with seated rest break and cues for PLB. Pt demonstrates reciprocal gait pattern with wide BOS, slowed cadence, and increased wheezing.     Balance Overall balance assessment: Needs assistance Sitting-balance support: No upper extremity supported Sitting balance-Leahy Scale: Normal     Standing balance support: Single extremity supported Standing balance-Leahy Scale: Good Standing balance comment: Pt needing SPC assist but no LOBs or stagger stepping.  He does have a history of falls/knees buckling                            Cognition Arousal/Alertness: Awake/alert Behavior During Therapy: Restless Overall Cognitive Status: Within Functional Limits for tasks assessed  General Comments: Able to follow 100% of simple 2 step commands      Exercises Other Exercises Other Exercises: Pt able to participate in bed mobility, transfers, and gait with SPC. Was grossly mod I but required CGA for safety with ambulation. Able to maintain SpO2 >90% during ambulation, but demonstrated delayed desaturation post ambulation to 80%. Able to recover quickly with cues for PLB and  seated rest break. Other Exercises: Pt educated regarding: PT role/POC, activity modification/pacing, PLB, and safety with mobility. He verbalized understanding.    General Comments General comments (skin integrity, edema, etc.): Increased difficulty and wheezing when donning socks while seated EOB      Pertinent Vitals/Pain Pain Assessment: Faces Faces Pain Scale: Hurts little more Pain Location: lung pain Pain Intervention(s): Monitored during session;Repositioned;Relaxation           PT Goals (current goals can now be found in the care plan section) Acute Rehab PT Goals Patient Stated Goal: get breating better and go home PT Goal Formulation: With patient Time For Goal Achievement: 01/18/21 Potential to Achieve Goals: Fair Progress towards PT goals: Progressing toward goals    Frequency    Min 2X/week      PT Plan Current plan remains appropriate       AM-PAC PT "6 Clicks" Mobility   Outcome Measure  Help needed turning from your back to your side while in a flat bed without using bedrails?: None Help needed moving from lying on your back to sitting on the side of a flat bed without using bedrails?: None Help needed moving to and from a bed to a chair (including a wheelchair)?: None Help needed standing up from a chair using your arms (e.g., wheelchair or bedside chair)?: None Help needed to walk in hospital room?: A Little Help needed climbing 3-5 steps with a railing? : A Little 6 Click Score: 22    End of Session Equipment Utilized During Treatment: Gait belt;Oxygen Activity Tolerance: Patient limited by fatigue Patient left: with bed alarm set;with call bell/phone within reach;in bed (bed in chair position with HOB at 37deg) Nurse Communication: Mobility status PT Visit Diagnosis: Unsteadiness on feet (R26.81);Muscle weakness (generalized) (M62.81)     Time: 0109-3235 PT Time Calculation (min) (ACUTE ONLY): 21 min  Charges:  $Therapeutic Exercise:  8-22 mins                     Vira Blanco, PT, DPT 2:12 PM,01/06/21

## 2021-01-07 ENCOUNTER — Inpatient Hospital Stay: Payer: Medicaid Other

## 2021-01-07 DIAGNOSIS — R1030 Lower abdominal pain, unspecified: Secondary | ICD-10-CM | POA: Diagnosis not present

## 2021-01-07 DIAGNOSIS — I5033 Acute on chronic diastolic (congestive) heart failure: Secondary | ICD-10-CM | POA: Diagnosis not present

## 2021-01-07 DIAGNOSIS — J9621 Acute and chronic respiratory failure with hypoxia: Secondary | ICD-10-CM | POA: Diagnosis not present

## 2021-01-07 DIAGNOSIS — J441 Chronic obstructive pulmonary disease with (acute) exacerbation: Secondary | ICD-10-CM | POA: Diagnosis not present

## 2021-01-07 LAB — URINALYSIS, COMPLETE (UACMP) WITH MICROSCOPIC
Bacteria, UA: NONE SEEN
Bilirubin Urine: NEGATIVE
Glucose, UA: 50 mg/dL — AB
Ketones, ur: NEGATIVE mg/dL
Leukocytes,Ua: NEGATIVE
Nitrite: NEGATIVE
Protein, ur: NEGATIVE mg/dL
Specific Gravity, Urine: 1.012 (ref 1.005–1.030)
Squamous Epithelial / HPF: NONE SEEN (ref 0–5)
pH: 7 (ref 5.0–8.0)

## 2021-01-07 LAB — BASIC METABOLIC PANEL
Anion gap: 8 (ref 5–15)
BUN: 27 mg/dL — ABNORMAL HIGH (ref 8–23)
CO2: 36 mmol/L — ABNORMAL HIGH (ref 22–32)
Calcium: 8.5 mg/dL — ABNORMAL LOW (ref 8.9–10.3)
Chloride: 92 mmol/L — ABNORMAL LOW (ref 98–111)
Creatinine, Ser: 0.99 mg/dL (ref 0.61–1.24)
GFR, Estimated: >60 mL/min (ref >60)
Glucose, Bld: 196 mg/dL — ABNORMAL HIGH (ref 70–99)
Potassium: 4.4 mmol/L (ref 3.5–5.1)
Sodium: 136 mmol/L (ref 135–145)

## 2021-01-07 LAB — GLUCOSE, CAPILLARY
Glucose-Capillary: 214 mg/dL — ABNORMAL HIGH (ref 70–99)
Glucose-Capillary: 242 mg/dL — ABNORMAL HIGH (ref 70–99)
Glucose-Capillary: 130 mg/dL — ABNORMAL HIGH (ref 70–99)
Glucose-Capillary: 216 mg/dL — ABNORMAL HIGH (ref 70–99)

## 2021-01-07 MED ORDER — TAMSULOSIN HCL 0.4 MG PO CAPS
0.4000 mg | ORAL_CAPSULE | Freq: Every day | ORAL | Status: DC
  Administered 2021-01-07 – 2021-01-15 (×9): 0.4 mg via ORAL
  Filled 2021-01-07 (×8): qty 1

## 2021-01-07 MED ORDER — HYDROCOD POLST-CPM POLST ER 10-8 MG/5ML PO SUER
5.0000 mL | Freq: Three times a day (TID) | ORAL | Status: DC | PRN
  Administered 2021-01-07 – 2021-01-12 (×14): 5 mL via ORAL
  Filled 2021-01-07 (×15): qty 5

## 2021-01-07 MED ORDER — GUAIFENESIN 100 MG/5ML PO SOLN
5.0000 mL | ORAL | Status: DC | PRN
  Administered 2021-01-12 – 2021-01-13 (×5): 100 mg via ORAL
  Filled 2021-01-07 (×7): qty 5

## 2021-01-07 NOTE — TOC Progression Note (Addendum)
Transition of Care [facility]) - Progression Note    Patient Details  Name: Cody Alexander MRN: [id number] Date of Birth: 18-Jan-1956  [facility] CM/SW Contact  Chapman Fitch, RN Phone Number: 01/07/2021, 1:23 PM  Clinical Narrative:    Patient requesting portable oxygen concentrator Patient states that her already  Has a concentrator at home, and portable tanks that he can fill on the home fill unit  Patient states that he has been assessed for a portable concentrator twice, but was denied both times. I explained to him that not everyone qualifies for the portable concentrators, sometimes due to the severity of the patient's respiratory status the portable concentrators are not appropriate.   Patient states that he will be going back to the home that he was admitted from Patient is unsure if he will have a ride at discharge. He is going to attempt to call a friend today to see if they will be available for when he is ready to discharge, and is also going to check to see if they will be able to bring his portable tank  Update:  Patient agreeable for me try to arrange home health. Patient states he does not have a preference of home health agency. Barbara Cower with [facility] review.  I have reached out to [facility], [facility], [place], and [facility] to see if they are in network with patient's insurance    Expected Discharge Plan: Home w Home Health Services Barriers to Discharge: Continued Medical Work up  Expected Discharge Plan and Services Expected Discharge Plan: Home w Home Health Services In-house Referral: Clinical Social Work   Post Acute Care Choice: Home Health Living arrangements for the past 2 months: Single Family Home                             HH Agency: NA (Pending.)         Social Determinants of Health (SDOH) Interventions    Readmission Risk Interventions No flowsheet data found.

## 2021-01-07 NOTE — Progress Notes (Signed)
Patient ID: Cody Alexander, male   DOB: 1956/02/21, 65 y.o.   MRN: [id number] [facility] PROGRESS NOTE  Taariq Leitz [id number]:[id number] DOB: 1956-07-17 DOA: 01/04/2021 PCP: Mckinley Jewel, FNP  HPI/Subjective: Patient states that he has been urinating some blood.  Having difficulty getting his urine out.  Having some pain in his flanks.  Admitted with COPD exacerbation.  Still having a lot of shortness of breath bronchospasm and coughing.  Objective: Vitals:   01/07/21 0843 01/07/21 1211  BP: 109/78 119/81  Pulse: 98 67  Resp: 18 17  Temp: 97.8 F (36.6 C) 97.9 F (36.6 C)  SpO2: 90% 98%    Intake/Output Summary (Last 24 hours) at 01/07/2021 1447 Last data filed at 01/07/2021 1300 Gross per 24 hour  Intake 1080 ml  Output 1150 ml  Net -70 ml   Filed Weights   01/06/21 0500 01/06/21 0824 01/07/21 0600  Weight: (!) 136.2 kg (!) 136.6 kg 135.4 kg    ROS: Review of Systems  Respiratory: Positive for cough, shortness of breath and wheezing.   Cardiovascular: Negative for chest pain.  Gastrointestinal: Positive for abdominal pain. Negative for nausea and vomiting.  Genitourinary: Positive for hematuria.   Exam: Physical Exam HENT:     Head: Normocephalic.     Mouth/Throat:     Pharynx: No oropharyngeal exudate.  Eyes:     General: Lids are normal.     Conjunctiva/sclera: Conjunctivae normal.     Pupils: Pupils are equal, round, and reactive to light.  Cardiovascular:     Rate and Rhythm: Normal rate and regular rhythm.     Heart sounds: Normal heart sounds, S1 normal and S2 normal.  Pulmonary:     Breath sounds: Transmitted upper airway sounds present. Examination of the right-middle field reveals decreased breath sounds and wheezing. Examination of the left-middle field reveals decreased breath sounds and wheezing. Examination of the right-lower field reveals decreased breath sounds and rhonchi. Examination of the left-lower field reveals decreased breath  sounds and rhonchi. Decreased breath sounds, wheezing and rhonchi present. No rales.  Abdominal:     Palpations: Abdomen is soft.     Tenderness: There is abdominal tenderness.  Musculoskeletal:     Right lower leg: No swelling.     Left lower leg: No swelling.  Skin:    General: Skin is warm.     Findings: No rash.  Neurological:     Mental Status: He is alert and oriented to person, place, and time.       Data Reviewed: Basic Metabolic Panel: Recent Labs  Lab 01/04/21 0220 01/05/21 0409 01/06/21 0450 01/07/21 0447  NA 136 136 135 136  K 4.6 4.2 4.2 4.4  CL 95* 92* 93* 92*  CO2 33* 35* 35* 36*  GLUCOSE 411* 226* 181* 196*  BUN 18 25* 27* 27*  CREATININE 1.29* 1.09 1.02 0.99  CALCIUM 8.8* 8.6* 8.5* 8.5*   Liver Function Tests: Recent Labs  Lab 01/04/21 0220  AST 17  ALT 18  ALKPHOS 56  BILITOT 0.8  PROT 6.4*  ALBUMIN 3.6   CBC: Recent Labs  Lab 01/04/21 0220  WBC 12.7*  NEUTROABS 9.3*  HGB 13.5  HCT 42.1  MCV 92.7  PLT 233   Cardiac Enzymes: Recent Labs  Lab 01/06/21 0450  CKTOTAL 151   BNP (last 3 results) Recent Labs    06/20/20 1505 01/04/21 0220  BNP 136.0* 93.2     CBG: Recent Labs  Lab 01/06/21 1143 01/06/21 1631 01/06/21  2027 01/07/21 0843 01/07/21 1210  GLUCAP 221* 149* 214* 214* 242*    Recent Results (from the past 240 hour(s))  Resp Panel by RT-PCR (Flu A&B, Covid) Nasopharyngeal Swab     Status: None   Collection Time: 01/04/21  2:21 AM   Specimen: Nasopharyngeal Swab; Nasopharyngeal(NP) swabs in vial transport medium  Result Value Ref Range Status   SARS Coronavirus 2 by RT PCR NEGATIVE NEGATIVE Final    Comment: (NOTE) SARS-CoV-2 target nucleic acids are NOT DETECTED.  The SARS-CoV-2 RNA is generally detectable in upper respiratory specimens during the acute phase of infection. The lowest concentration of SARS-CoV-2 viral copies this assay can detect is 138 copies/mL. A negative result does not preclude  SARS-Cov-2 infection and should not be used as the sole basis for treatment or other patient management decisions. A negative result may occur with  improper specimen collection/handling, submission of specimen other than nasopharyngeal swab, presence of viral mutation(s) within the areas targeted by this assay, and inadequate number of viral copies(<138 copies/mL). A negative result must be combined with clinical observations, patient history, and epidemiological information. The expected result is Negative.  Fact Sheet for Patients:  BloggerCourse.com  Fact Sheet for Healthcare Providers:  SeriousBroker.it  This test is no t yet approved or cleared by the [place] FDA and  has been authorized for detection and/or diagnosis of SARS-CoV-2 by FDA under an Emergency Use Authorization (EUA). This EUA will remain  in effect (meaning this test can be used) for the duration of the COVID-19 declaration under Section 564(b)(1) of the Act, 21 U.S.C.section 360bbb-3(b)(1), unless the authorization is terminated  or revoked sooner.       Influenza A by PCR NEGATIVE NEGATIVE Final   Influenza B by PCR NEGATIVE NEGATIVE Final    Comment: (NOTE) The Xpert Xpress SARS-CoV-2/FLU/RSV plus assay is intended as an aid in the diagnosis of influenza from Nasopharyngeal swab specimens and should not be used as a sole basis for treatment. Nasal washings and aspirates are unacceptable for Xpert Xpress SARS-CoV-2/FLU/RSV testing.  Fact Sheet for Patients: BloggerCourse.com  Fact Sheet for Healthcare Providers: SeriousBroker.it  This test is not yet approved or cleared by the [place] FDA and has been authorized for detection and/or diagnosis of SARS-CoV-2 by FDA under an Emergency Use Authorization (EUA). This EUA will remain in effect (meaning this test can be used) for the duration of  the COVID-19 declaration under Section 564(b)(1) of the Act, 21 U.S.C. section 360bbb-3(b)(1), unless the authorization is terminated or revoked.  Performed at [facility], [address], [place], [place] [address]      Studies: [facility] Chest Friona 1 View  Result Date: 01/05/2021 CLINICAL DATA:  Dyspnea. EXAM: PORTABLE CHEST 1 VIEW COMPARISON:  Radiograph yesterday. FINDINGS: Stable cardiomegaly. Unchanged vascular congestion. Aortic atherosclerosis. No new airspace disease. No pleural fluid or pneumothorax. Stable osseous structures. IMPRESSION: Unchanged cardiomegaly and vascular congestion. Electronically Signed   By: Narda Rutherford M.D.   On: 01/05/2021 23:29    Scheduled Meds: . acetylcysteine  3 mL Nebulization BID  . aspirin EC  81 mg Oral Daily  . atorvastatin  40 mg Oral Daily  . bisoprolol  5 mg Oral Daily  . budesonide (PULMICORT) nebulizer solution  0.5 mg Nebulization BID  . doxycycline  100 mg Oral Q12H  . enoxaparin (LOVENOX) injection  40 mg Subcutaneous Q24H  . fluconazole  100 mg Oral Daily  . furosemide  40 mg Intravenous BID  . guaiFENesin  1,200 mg Oral BID  . insulin aspart  0-20 Units Subcutaneous TID WC  . insulin aspart  0-5 Units Subcutaneous QHS  . insulin aspart  4 Units Subcutaneous TID WC  . insulin glargine  24 Units Subcutaneous Daily  . ipratropium-albuterol  3 mL Nebulization TID  . ketoconazole   Topical BID  . lisinopril  20 mg Oral BID  . spironolactone  12.5 mg Oral Daily  . tamsulosin  0.4 mg Oral Daily    Assessment/Plan:  1. COPD exacerbation.  Continue daily Solu-Medrol dosing.  Continue Mucomyst, budesonide and DuoNeb nebulizer solution.  Empiric doxycycline.  Patient asking for Tussionex to move to 3 times a day.  Patient still with diffuse wheezing coughing with deep breath. 2. Acute on chronic hypoxic respiratory failure.  Now down to his baseline 2 L of oxygen.  Was as high as 6 L during this hospitalization. 3. Acute on  chronic diastolic congestive heart failure with EF of 60 to 65%.  Continue IV Lasix.  Beta-blocker changed to bisoprolol. 4. Abdominal pain and hematuria will get renal stone protocol CT scan, urine analysis.  Start Flomax.  Bladder scan.  Patient states that he had a catheterization at [facility]. 5. Elevated troponin likely demand ischemia rather than NSTEMI.  Continue aspirin and bisoprolol. 6. Uncontrolled type 2 diabetes mellitus with hyperglycemia.  Continue Lantus 24 units.  Continue sliding scale insulin.  Hemoglobin A1c elevated at 10.1. 7. Rash on back seems to be fading with The Call Cream and Oral Fluconazole 8. Obesity with a BMI of 39.3 as        Code Status:     Code Status Orders  (From admission, onward)         Start     Ordered   01/04/21 0339  Full code  Continuous        01/04/21 0347        Code Status History    Date Active Date Inactive Code Status Order ID Comments User Context   06/20/2020 1959 06/25/2020 0032 Full Code [id number]  Rometta Emery, MD ED   02/25/2018 1608 02/28/2018 0724 Full Code [id number]  Katha Hamming, MD ED   Advance Care Planning Activity     Disposition Plan: Status is: Inpatient  Dispo: The patient is from: Home              Anticipated d/c is to: Home              Patient currently still with a lot of bronchospasm needing IV Solu-Medrol.   Difficult to place patient.  No.  Time spent: 26 minutes  Kortlyn Koltz [employer]

## 2021-01-08 DIAGNOSIS — N401 Enlarged prostate with lower urinary tract symptoms: Secondary | ICD-10-CM

## 2021-01-08 DIAGNOSIS — R3911 Hesitancy of micturition: Secondary | ICD-10-CM

## 2021-01-08 DIAGNOSIS — J9621 Acute and chronic respiratory failure with hypoxia: Secondary | ICD-10-CM | POA: Diagnosis not present

## 2021-01-08 DIAGNOSIS — J441 Chronic obstructive pulmonary disease with (acute) exacerbation: Secondary | ICD-10-CM | POA: Diagnosis not present

## 2021-01-08 DIAGNOSIS — I5033 Acute on chronic diastolic (congestive) heart failure: Secondary | ICD-10-CM | POA: Diagnosis not present

## 2021-01-08 LAB — BASIC METABOLIC PANEL
Anion gap: 9 (ref 5–15)
BUN: 26 mg/dL — ABNORMAL HIGH (ref 8–23)
CO2: 38 mmol/L — ABNORMAL HIGH (ref 22–32)
Calcium: 8.9 mg/dL (ref 8.9–10.3)
Chloride: 89 mmol/L — ABNORMAL LOW (ref 98–111)
Creatinine, Ser: 1.02 mg/dL (ref 0.61–1.24)
GFR, Estimated: >60 mL/min (ref >60)
Glucose, Bld: 225 mg/dL — ABNORMAL HIGH (ref 70–99)
Potassium: 4.6 mmol/L (ref 3.5–5.1)
Sodium: 136 mmol/L (ref 135–145)

## 2021-01-08 LAB — GLUCOSE, CAPILLARY
Glucose-Capillary: 182 mg/dL — ABNORMAL HIGH (ref 70–99)
Glucose-Capillary: 194 mg/dL — ABNORMAL HIGH (ref 70–99)
Glucose-Capillary: 170 mg/dL — ABNORMAL HIGH (ref 70–99)
Glucose-Capillary: 236 mg/dL — ABNORMAL HIGH (ref 70–99)

## 2021-01-08 LAB — ALDOLASE: Aldolase: 10 U/L (ref 3.3–10.3)

## 2021-01-08 MED ORDER — ENOXAPARIN SODIUM 80 MG/0.8ML IJ SOSY
0.5000 mg/kg | PREFILLED_SYRINGE | INTRAMUSCULAR | Status: DC
  Administered 2021-01-08 – 2021-01-14 (×7): 67.5 mg via SUBCUTANEOUS
  Filled 2021-01-08 (×7): qty 0.8

## 2021-01-08 MED ORDER — TRIAMCINOLONE ACETONIDE 0.1 % EX CREA
TOPICAL_CREAM | Freq: Three times a day (TID) | CUTANEOUS | Status: DC
  Administered 2021-01-10: 1 via TOPICAL
  Filled 2021-01-08 (×2): qty 15

## 2021-01-08 NOTE — Progress Notes (Signed)
PHARMACIST - PHYSICIAN COMMUNICATION  CONCERNING:  Enoxaparin (Lovenox) for DVT Prophylaxis    RECOMMENDATION: Patient was prescribed enoxaprin 40mg  q24 hours for VTE prophylaxis.   Filed Weights   01/06/21 0824 01/07/21 0600 01/08/21 0756  Weight: (!) 136.6 kg (301 lb 3.2 oz) 135.4 kg (298 lb 6.4 oz) 134.5 kg (296 lb 9.6 oz)    Body mass index is 39.13 kg/m.  Estimated Creatinine Clearance: 106.6 mL/min (by C-G formula based on SCr of 1.02 mg/dL).   Based on [facility] policy patient is candidate for enoxaparin 0.5mg /kg TBW SQ every 24 hours based on BMI being >30.  DESCRIPTION: Pharmacy has adjusted enoxaparin dose per [facility] policy.  Patient is now receiving enoxaparin 67.5 mg every 24 hours   [facility], PharmD Pharmacy Resident  01/08/2021 4:37 PM

## 2021-01-08 NOTE — Progress Notes (Signed)
Patient ID: Cody Alexander, male   DOB: May 12, 1956, 65 y.o.   MRN: [id number] [facility] PROGRESS NOTE  Cody Alexander LSL:[id number] DOB: Aug 13, 1956 DOA: 01/04/2021 PCP: Mckinley Jewel, FNP  HPI/Subjective: Patient still has a lot of congestion and a lot of cough and feels like he is almost in a pass out with all the coughing.  He states the Tussionex is the only thing that helps him.  Objective: Vitals:   01/08/21 1152 01/08/21 1321  BP: 109/77   Pulse: 69   Resp: 20   Temp: 98.7 F (37.1 C)   SpO2: 95% 95%    Intake/Output Summary (Last 24 hours) at 01/08/2021 1502 Last data filed at 01/08/2021 1330 Gross per 24 hour  Intake 600 ml  Output 1325 ml  Net -725 ml   Filed Weights   01/06/21 0824 01/07/21 0600 01/08/21 0756  Weight: (!) 136.6 kg 135.4 kg 134.5 kg    ROS: Review of Systems  Respiratory: Positive for cough, shortness of breath and wheezing.   Cardiovascular: Negative for chest pain.  Gastrointestinal: Negative for abdominal pain, nausea and vomiting.   Exam: Physical Exam HENT:     Head: Normocephalic.     Mouth/Throat:     Pharynx: No oropharyngeal exudate.  Eyes:     General: Lids are normal.     Conjunctiva/sclera: Conjunctivae normal.     Pupils: Pupils are equal, round, and reactive to light.  Cardiovascular:     Rate and Rhythm: Normal rate and regular rhythm.     Heart sounds: Normal heart sounds, S1 normal and S2 normal.  Pulmonary:     Breath sounds: Normal breath sounds. No decreased breath sounds, wheezing, rhonchi or rales.  Abdominal:     Palpations: Abdomen is soft.     Tenderness: There is no abdominal tenderness.  Musculoskeletal:     Right lower leg: No swelling.     Left lower leg: No swelling.  Skin:    General: Skin is warm.     Comments: Large erythematous rash on the back with edges.  Neurological:     Mental Status: He is alert and oriented to person, place, and time.       Data Reviewed: Basic  Metabolic Panel: Recent Labs  Lab 01/04/21 0220 01/05/21 0409 01/06/21 0450 01/07/21 0447 01/08/21 0353  NA 136 136 135 136 136  K 4.6 4.2 4.2 4.4 4.6  CL 95* 92* 93* 92* 89*  CO2 33* 35* 35* 36* 38*  GLUCOSE 411* 226* 181* 196* 225*  BUN 18 25* 27* 27* 26*  CREATININE 1.29* 1.09 1.02 0.99 1.02  CALCIUM 8.8* 8.6* 8.5* 8.5* 8.9   Liver Function Tests: Recent Labs  Lab 01/04/21 0220  AST 17  ALT 18  ALKPHOS 56  BILITOT 0.8  PROT 6.4*  ALBUMIN 3.6   CBC: Recent Labs  Lab 01/04/21 0220  WBC 12.7*  NEUTROABS 9.3*  HGB 13.5  HCT 42.1  MCV 92.7  PLT 233   Cardiac Enzymes: Recent Labs  Lab 01/06/21 0450  CKTOTAL 151   BNP (last 3 results) Recent Labs    06/20/20 1505 01/04/21 0220  BNP 136.0* 93.2     CBG: Recent Labs  Lab 01/07/21 1210 01/07/21 1711 01/07/21 2043 01/08/21 0756 01/08/21 1153  GLUCAP 242* 130* 216* 182* 194*    Recent Results (from the past 240 hour(s))  Resp Panel by RT-PCR (Flu A&B, Covid) Nasopharyngeal Swab     Status: None   Collection Time: 01/04/21  2:21 AM   Specimen: Nasopharyngeal Swab; Nasopharyngeal(NP) swabs in vial transport medium  Result Value Ref Range Status   SARS Coronavirus 2 by RT PCR NEGATIVE NEGATIVE Final    Comment: (NOTE) SARS-CoV-2 target nucleic acids are NOT DETECTED.  The SARS-CoV-2 RNA is generally detectable in upper respiratory specimens during the acute phase of infection. The lowest concentration of SARS-CoV-2 viral copies this assay can detect is 138 copies/mL. A negative result does not preclude SARS-Cov-2 infection and should not be used as the sole basis for treatment or other patient management decisions. A negative result may occur with  improper specimen collection/handling, submission of specimen other than nasopharyngeal swab, presence of viral mutation(s) within the areas targeted by this assay, and inadequate number of viral copies(<138 copies/mL). A negative result must be  combined with clinical observations, patient history, and epidemiological information. The expected result is Negative.  Fact Sheet for Patients:  BloggerCourse.com  Fact Sheet for Healthcare Providers:  SeriousBroker.it  This test is no t yet approved or cleared by the [place] FDA and  has been authorized for detection and/or diagnosis of SARS-CoV-2 by FDA under an Emergency Use Authorization (EUA). This EUA will remain  in effect (meaning this test can be used) for the duration of the COVID-19 declaration under Section 564(b)(1) of the Act, 21 U.S.C.section 360bbb-3(b)(1), unless the authorization is terminated  or revoked sooner.       Influenza A by PCR NEGATIVE NEGATIVE Final   Influenza B by PCR NEGATIVE NEGATIVE Final    Comment: (NOTE) The Xpert Xpress SARS-CoV-2/FLU/RSV plus assay is intended as an aid in the diagnosis of influenza from Nasopharyngeal swab specimens and should not be used as a sole basis for treatment. Nasal washings and aspirates are unacceptable for Xpert Xpress SARS-CoV-2/FLU/RSV testing.  Fact Sheet for Patients: BloggerCourse.com  Fact Sheet for Healthcare Providers: SeriousBroker.it  This test is not yet approved or cleared by the [place] FDA and has been authorized for detection and/or diagnosis of SARS-CoV-2 by FDA under an Emergency Use Authorization (EUA). This EUA will remain in effect (meaning this test can be used) for the duration of the COVID-19 declaration under Section 564(b)(1) of the Act, 21 U.S.C. section 360bbb-3(b)(1), unless the authorization is terminated or revoked.  Performed at [facility], [address], [place], [place] [address]      Studies: CT RENAL STONE STUDY  Result Date: 01/07/2021 CLINICAL DATA:  UTI and hematuria. EXAM: CT ABDOMEN AND PELVIS WITHOUT CONTRAST TECHNIQUE: Multidetector  CT imaging of the abdomen and pelvis was performed following the standard protocol without IV contrast. COMPARISON:  CT chest 12/28/2018 FINDINGS: Lower chest: No acute abnormality. Evaluation of the abdominal viscera limited by the lack of IV contrast. Hepatobiliary: There is a small hyperechoic subcapsular lesion in the posterior right hepatic lobe, stable. Normal appearance of the gallbladder. Pancreas: Unremarkable. No surrounding inflammatory changes. Spleen: Normal in size without focal abnormality. Adrenals/Urinary Tract: Normal right adrenal gland. 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 No renal calculi or hydronephrosis. No mass lesion identified. Urinary bladder is unremarkable in appearance. Stomach/Bowel: Stomach is within normal limits. Appendix appears normal. No evidence of bowel wall thickening, distention, or inflammatory changes. Vascular/Lymphatic: Aortic atherosclerosis. No enlarged abdominal or pelvic lymph nodes. Reproductive: Prostate is unremarkable. Other: Fat containing left inguinal hernia. Tiny fat containing umbilical hernia. Musculoskeletal: No acute findings. Multilevel degenerative disc disease in the thoracolumbar spine. IMPRESSION: 1. No acute finding in the  abdomen or pelvis on a noncontrast exam. No renal calculi or hydronephrosis. 2. Fat containing left inguinal hernia. 3. Aortic atherosclerosis. Aortic Atherosclerosis (ICD10-I70.0).  In Electronically Signed   By: Emmaline Kluver M.D.   On: 01/07/2021 17:03    Scheduled Meds: . acetylcysteine  3 mL Nebulization BID  . aspirin EC  81 mg Oral Daily  . atorvastatin  40 mg Oral Daily  . bisoprolol  5 mg Oral Daily  . budesonide (PULMICORT) nebulizer solution  0.5 mg Nebulization BID  . doxycycline  100 mg Oral Q12H  . enoxaparin (LOVENOX) injection  40 mg Subcutaneous Q24H  . fluconazole  100 mg Oral Daily  . furosemide  40 mg Intravenous BID  . guaiFENesin  1,200 mg Oral BID  . insulin  aspart  0-20 Units Subcutaneous TID WC  . insulin aspart  0-5 Units Subcutaneous QHS  . insulin aspart  4 Units Subcutaneous TID WC  . insulin glargine  24 Units Subcutaneous Daily  . ipratropium-albuterol  3 mL Nebulization TID  . ketoconazole   Topical BID  . lisinopril  20 mg Oral BID  . spironolactone  12.5 mg Oral Daily  . tamsulosin  0.4 mg Oral Daily    Assessment/Plan:  1. COPD exacerbation.  Continue daily Solu-Medrol dosing.  Continue Mucomyst, budesonide and DuoNeb nebulizer solution.  Continue empiric doxycycline.  Continue Tussionex.  Patient still very slow to improve but finally starting to see improvement.  Moving a little bit better air and a little less wheeze.  Still coughing with deep breath. 2. Acute on chronic hypoxic respiratory failure.  Now down to his baseline 2 L. 3. Acute on chronic diastolic congestive heart failure with EF 60 to 65%.  On IV Lasix.  Beta-blocker changed over to bisoprolol.  Patient on lisinopril and spironolactone. 4. BPH with trouble getting his urine out started Flomax yesterday. 5. Trace blood in the urine likely from recent catheterization. 6. Elevated troponin likely demand ischemia 7. Uncontrolled type 2 diabetes mellitus with hyperglycemia continue Lantus 24 units while on Solu-Medrol.  Hemoglobin A1c elevated at 10.1. 8. Rash on back more pronounced today.  On Diflucan and ketoconazole cream.  Add triamcinolone cream. 9. Obesity with a BMI of 39.13       Code Status:     Code Status Orders  (From admission, onward)         Start     Ordered   01/04/21 0339  Full code  Continuous        01/04/21 0347        Code Status History    Date Active Date Inactive Code Status Order ID Comments User Context   06/20/2020 1959 06/25/2020 0032 Full Code [id number]  Rometta Emery, MD ED   02/25/2018 1608 02/28/2018 0724 Full Code [id number]  Katha Hamming, MD ED   Advance Care Planning Activity     Disposition Plan: Status  is: Inpatient  Dispo: The patient is from: Home              Anticipated d/c is to: Home              Patient currently still with bronchospasm and being treated for COPD exacerbation with IV Solu-Medrol.   Difficult to place patient.  No.  Time spent: 27 minutes  Stasia Somero [employer]

## 2021-01-08 NOTE — Progress Notes (Signed)
Physical Therapy Treatment Patient Details Name: Cody Alexander MRN: [id number] DOB: August 15, 1956 Today's Date: 01/08/2021    History of Present Illness 65 y.o. male with medical history significant for COPD on home O2 at 2 L, obesity, OSA, HTN, mild CAD on cardiac cath 02/2018, diastolic heart failure, last EF 55% November 2021, hospitalized in February in [place], for CHF exacerbation with NSTEMI, who presents by EMS with shortness of breath and wheezing that started the day prior.  Pt also recently treated for UTI, apparently had stopped taking antibiotics.    PT Comments    Patient making good progress with functional independence. Patient progressed to walking a lap around nursing station this session without assistive device while pulling oxygen tank. Patient ambulated on 2L 02 with Sp02 94-96% but required 4 brief standing rest breaks. Patient educated on energy conservation, pacing for endurance, and walking for conditioning. Recommend to continue PT to maximize independence in preparation for home mobility. Recommend a cane for longer distance ambulation.    Follow Up Recommendations  Home health PT     Equipment Recommendations  Cane    Recommendations for Other Services       Precautions / Restrictions Precautions Precautions: Fall Restrictions Weight Bearing Restrictions: No    Mobility  Bed Mobility Overal bed mobility: Modified Independent                  Transfers Overall transfer level: Modified independent Equipment used: None             General transfer comment: patient using UE support to stand from bed in the lowest height position. no physical assistance required.  Ambulation/Gait Ambulation/Gait assistance: Supervision Gait Distance (Feet): 225 Feet Assistive device: Straight cane Gait Pattern/deviations: Step-through pattern Gait velocity: decreased   General Gait Details: patient ambulated without assistive device per his request.  patient ambulated around the nursing station while pulling the oxygen tank without loss of balance. patient required 4 brief standing rest breaks due to shortness of breath with Sp02 94% on 2 L02. encouraged pacing for endurance, walking for conditioning, energy conservation techniques   Stairs             Wheelchair Mobility    Modified Rankin (Stroke Patients Only)       Balance   Sitting-balance support: No upper extremity supported Sitting balance-Leahy Scale: Normal     Standing balance support: No upper extremity supported Standing balance-Leahy Scale: Fair Standing balance comment: patient has one bout of unsteadiness initially with standing that is self corrected                            Cognition Arousal/Alertness: Awake/alert Behavior During Therapy: WFL for tasks assessed/performed Overall Cognitive Status: Within Functional Limits for tasks assessed                                        Exercises      General Comments        Pertinent Vitals/Pain Pain Assessment: No/denies pain    Home Living                      Prior Function            PT Goals (current goals can now be found in the care plan section) Acute Rehab PT Goals Patient Stated Goal:  to go home PT Goal Formulation: With patient Time For Goal Achievement: 01/18/21 Potential to Achieve Goals: Fair Progress towards PT goals: Progressing toward goals    Frequency    Min 2X/week      PT Plan Current plan remains appropriate    Co-evaluation              AM-PAC PT "6 Clicks" Mobility   Outcome Measure  Help needed turning from your back to your side while in a flat bed without using bedrails?: None Help needed moving from lying on your back to sitting on the side of a flat bed without using bedrails?: None Help needed moving to and from a bed to a chair (including a wheelchair)?: None Help needed standing up from a chair using  your arms (e.g., wheelchair or bedside chair)?: None Help needed to walk in hospital room?: A Little Help needed climbing 3-5 steps with a railing? : A Little 6 Click Score: 22    End of Session Equipment Utilized During Treatment: Oxygen (patient declined gait belt) Activity Tolerance: Patient tolerated treatment well Patient left:  (sitting on edge of bed with lunch tray set-up) Nurse Communication: Mobility status PT Visit Diagnosis: Unsteadiness on feet (R26.81);Muscle weakness (generalized) (M62.81)     Time: 5621-3086 PT Time Calculation (min) (ACUTE ONLY): 26 min  Charges:  $Therapeutic Activity: 23-37 mins                     Donna Bernard, PT, MPT   Ina Homes 01/08/2021, 1:20 PM

## 2021-01-09 DIAGNOSIS — R778 Other specified abnormalities of plasma proteins: Secondary | ICD-10-CM | POA: Diagnosis not present

## 2021-01-09 DIAGNOSIS — I509 Heart failure, unspecified: Secondary | ICD-10-CM | POA: Diagnosis not present

## 2021-01-09 DIAGNOSIS — J9611 Chronic respiratory failure with hypoxia: Secondary | ICD-10-CM | POA: Diagnosis not present

## 2021-01-09 DIAGNOSIS — J441 Chronic obstructive pulmonary disease with (acute) exacerbation: Secondary | ICD-10-CM | POA: Diagnosis not present

## 2021-01-09 LAB — BASIC METABOLIC PANEL
Anion gap: 8 (ref 5–15)
BUN: 28 mg/dL — ABNORMAL HIGH (ref 8–23)
CO2: 37 mmol/L — ABNORMAL HIGH (ref 22–32)
Calcium: 8.7 mg/dL — ABNORMAL LOW (ref 8.9–10.3)
Chloride: 92 mmol/L — ABNORMAL LOW (ref 98–111)
Creatinine, Ser: 1.15 mg/dL (ref 0.61–1.24)
GFR, Estimated: >60 mL/min (ref >60)
Glucose, Bld: 203 mg/dL — ABNORMAL HIGH (ref 70–99)
Potassium: 4.9 mmol/L (ref 3.5–5.1)
Sodium: 137 mmol/L (ref 135–145)

## 2021-01-09 LAB — GLUCOSE, CAPILLARY
Glucose-Capillary: 163 mg/dL — ABNORMAL HIGH (ref 70–99)
Glucose-Capillary: 244 mg/dL — ABNORMAL HIGH (ref 70–99)
Glucose-Capillary: 125 mg/dL — ABNORMAL HIGH (ref 70–99)
Glucose-Capillary: 225 mg/dL — ABNORMAL HIGH (ref 70–99)

## 2021-01-09 MED ORDER — INSULIN ASPART 100 UNIT/ML IJ SOLN
8.0000 [IU] | Freq: Three times a day (TID) | INTRAMUSCULAR | Status: DC
  Administered 2021-01-09 – 2021-01-11 (×6): 8 [IU] via SUBCUTANEOUS
  Filled 2021-01-09 (×5): qty 1

## 2021-01-09 MED ORDER — BISOPROLOL FUMARATE 5 MG PO TABS
2.5000 mg | ORAL_TABLET | Freq: Every day | ORAL | Status: DC
  Administered 2021-01-10 – 2021-01-15 (×6): 2.5 mg via ORAL
  Filled 2021-01-09 (×6): qty 0.5

## 2021-01-09 MED ORDER — INSULIN GLARGINE 100 UNIT/ML ~~LOC~~ SOLN
20.0000 [IU] | Freq: Every day | SUBCUTANEOUS | Status: DC
  Administered 2021-01-10 – 2021-01-15 (×6): 20 [IU] via SUBCUTANEOUS
  Filled 2021-01-09 (×6): qty 0.2

## 2021-01-09 MED ORDER — PREDNISONE 20 MG PO TABS
20.0000 mg | ORAL_TABLET | Freq: Every day | ORAL | Status: DC
  Administered 2021-01-09 – 2021-01-15 (×7): 20 mg via ORAL
  Filled 2021-01-09 (×4): qty 1
  Filled 2021-01-09: qty 2
  Filled 2021-01-09 (×2): qty 1

## 2021-01-09 MED ORDER — FUROSEMIDE 40 MG PO TABS
40.0000 mg | ORAL_TABLET | Freq: Two times a day (BID) | ORAL | Status: DC
  Administered 2021-01-09 – 2021-01-15 (×11): 40 mg via ORAL
  Filled 2021-01-09 (×11): qty 1

## 2021-01-09 NOTE — Progress Notes (Signed)
SUBJECTIVE: Patient is still having shortness of breath   Vitals:   01/08/21 1923 01/08/21 1948 01/09/21 0423 01/09/21 0749  BP:  104/65 118/86 127/87  Pulse:  66 62 68  Resp:  18 20 20   Temp:  98 F (36.7 C) 97.7 F (36.5 C) 98 F (36.7 C)  TempSrc:  Oral Oral Oral  SpO2: 92% 92% 99% 92%  Weight:   134.4 kg   Height:        Intake/Output Summary (Last 24 hours) at 01/09/2021 0907 Last data filed at 01/09/2021 0751 Gross per 24 hour  Intake 840 ml  Output 1200 ml  Net -360 ml    LABS: Basic Metabolic Panel: Recent Labs    01/08/21 0353 01/09/21 0439  NA 136 137  K 4.6 4.9  CL 89* 92*  CO2 38* 37*  GLUCOSE 225* 203*  BUN 26* 28*  CREATININE 1.02 1.15  CALCIUM 8.9 8.7*   Liver Function Tests: No results for input(s): AST, ALT, ALKPHOS, BILITOT, PROT, ALBUMIN in the last 72 hours. No results for input(s): LIPASE, AMYLASE in the last 72 hours. CBC: No results for input(s): WBC, NEUTROABS, HGB, HCT, MCV, PLT in the last 72 hours. Cardiac Enzymes: No results for input(s): CKTOTAL, CKMB, CKMBINDEX, TROPONINI in the last 72 hours. BNP: Invalid input(s): POCBNP D-Dimer: No results for input(s): DDIMER in the last 72 hours. Hemoglobin A1C: No results for input(s): HGBA1C in the last 72 hours. Fasting Lipid Panel: No results for input(s): CHOL, HDL, LDLCALC, TRIG, CHOLHDL, LDLDIRECT in the last 72 hours. Thyroid Function Tests: No results for input(s): TSH, T4TOTAL, T3FREE, THYROIDAB in the last 72 hours.  Invalid input(s): FREET3 Anemia Panel: No results for input(s): VITAMINB12, FOLATE, FERRITIN, TIBC, IRON, RETICCTPCT in the last 72 hours.   PHYSICAL EXAM General: Well developed, well nourished, in no acute distress HEENT:  Normocephalic and atramatic Neck:  No JVD.  Lungs: Clear bilaterally to auscultation and percussion. Heart: HRRR . Normal S1 and S2 without gallops or murmurs.  Abdomen: Bowel sounds are positive, abdomen soft and non-tender  Msk:  Back  normal, normal gait. Normal strength and tone for age. Extremities: No clubbing, cyanosis or edema.   Neuro: Alert and oriented X 3. Psych:  Good affect, responds appropriately  TELEMETRY: Normal sinus rhythm  ASSESSMENT AND PLAN: Mild coronary artery disease with elevated troponin due to demand ischemia and shortness of breath secondary to COPD exacerbation.  Advise follow-up in the office next week on Tuesday at 10:00.  Active Problems:   NSTEMI (non-ST elevated myocardial infarction) (HCC)   COPD exacerbation (HCC)   Essential hypertension   Acute on chronic respiratory failure with hypoxia (HCC)   Uncontrolled type 2 diabetes mellitus with hyperglycemia (HCC)   Acute on chronic diastolic CHF (congestive heart failure) (HCC)   OSA (obstructive sleep apnea)   Obesity (BMI 30-39.9)   Chronic respiratory failure with hypoxia (HCC)   Elevated troponin   Rash   Lower abdominal pain   Benign prostatic hyperplasia with urinary hesitancy    Bernd Crom A, MD, [facility] 01/09/2021 9:07 AM

## 2021-01-09 NOTE — Progress Notes (Signed)
Mobility Specialist - Progress Note   01/09/21 1243  Mobility  Activity Refused mobility  Mobility performed by Mobility specialist    Pt politely declined mobility this date, no reason specified. Pt states he is not up for ambulation today. Will attempt session another date/time as pt is agreeable.    Filiberto Pinks Mobility Specialist 01/09/21, 12:44 PM

## 2021-01-09 NOTE — Progress Notes (Addendum)
Patient ID: Cody Alexander, male   DOB: Aug 04, 1956, 65 y.o.   MRN: [id number] [facility] PROGRESS NOTE  Cody Alexander CZY:[id number] DOB: 11-29-1955 DOA: 01/04/2021 PCP: Mckinley Jewel, FNP  HPI/Subjective: Patient still with a lot of coughing.  Upper airway congestion.  Still not feeling well.  Admitted with COPD exacerbation.  Objective: Vitals:   01/09/21 1333 01/09/21 1612  BP:  135/81  Pulse:  63  Resp:  18  Temp:  98.1 F (36.7 C)  SpO2: 94% 95%    Intake/Output Summary (Last 24 hours) at 01/09/2021 1818 Last data filed at 01/09/2021 1340 Gross per 24 hour  Intake 1200 ml  Output 800 ml  Net 400 ml   Filed Weights   01/07/21 0600 01/08/21 0756 01/09/21 0423  Weight: 135.4 kg 134.5 kg 134.4 kg    ROS: Review of Systems  Respiratory: Positive for cough, shortness of breath and wheezing.   Cardiovascular: Negative for chest pain.  Gastrointestinal: Negative for abdominal pain, nausea and vomiting.   Exam: Physical Exam HENT:     Head: Normocephalic.     Mouth/Throat:     Pharynx: No oropharyngeal exudate.  Eyes:     General: Lids are normal.     Conjunctiva/sclera: Conjunctivae normal.     Pupils: Pupils are equal, round, and reactive to light.  Cardiovascular:     Rate and Rhythm: Normal rate and regular rhythm.     Heart sounds: Normal heart sounds, S1 normal and S2 normal.  Pulmonary:     Breath sounds: Transmitted upper airway sounds present. Examination of the right-middle field reveals wheezing. Examination of the left-middle field reveals wheezing. Examination of the right-lower field reveals decreased breath sounds and wheezing. Examination of the left-lower field reveals decreased breath sounds and wheezing. Decreased breath sounds and wheezing present. No rhonchi.  Abdominal:     Palpations: Abdomen is soft.     Tenderness: There is no abdominal tenderness.  Musculoskeletal:     Right lower leg: Swelling present.     Left lower leg:  Swelling present.  Skin:    General: Skin is warm.     Comments: Large erythematous rash on his back.  Neurological:     Mental Status: He is alert and oriented to person, place, and time.       Data Reviewed: Basic Metabolic Panel: Recent Labs  Lab 01/05/21 0409 01/06/21 0450 01/07/21 0447 01/08/21 0353 01/09/21 0439  NA 136 135 136 136 137  K 4.2 4.2 4.4 4.6 4.9  CL 92* 93* 92* 89* 92*  CO2 35* 35* 36* 38* 37*  GLUCOSE 226* 181* 196* 225* 203*  BUN 25* 27* 27* 26* 28*  CREATININE 1.09 1.02 0.99 1.02 1.15  CALCIUM 8.6* 8.5* 8.5* 8.9 8.7*   Liver Function Tests: Recent Labs  Lab 01/04/21 0220  AST 17  ALT 18  ALKPHOS 56  BILITOT 0.8  PROT 6.4*  ALBUMIN 3.6   CBC: Recent Labs  Lab 01/04/21 0220  WBC 12.7*  NEUTROABS 9.3*  HGB 13.5  HCT 42.1  MCV 92.7  PLT 233   Cardiac Enzymes: Recent Labs  Lab 01/06/21 0450  CKTOTAL 151   BNP (last 3 results) Recent Labs    06/20/20 1505 01/04/21 0220  BNP 136.0* 93.2    CBG: Recent Labs  Lab 01/08/21 1704 01/08/21 2123 01/09/21 0750 01/09/21 1138 01/09/21 1631  GLUCAP 170* 236* 163* 244* 125*    Recent Results (from the past 240 hour(s))  Resp Panel by  RT-PCR (Flu A&B, Covid) Nasopharyngeal Swab     Status: None   Collection Time: 01/04/21  2:21 AM   Specimen: Nasopharyngeal Swab; Nasopharyngeal(NP) swabs in vial transport medium  Result Value Ref Range Status   SARS Coronavirus 2 by RT PCR NEGATIVE NEGATIVE Final    Comment: (NOTE) SARS-CoV-2 target nucleic acids are NOT DETECTED.  The SARS-CoV-2 RNA is generally detectable in upper respiratory specimens during the acute phase of infection. The lowest concentration of SARS-CoV-2 viral copies this assay can detect is 138 copies/mL. A negative result does not preclude SARS-Cov-2 infection and should not be used as the sole basis for treatment or other patient management decisions. A negative result may occur with  improper specimen  collection/handling, submission of specimen other than nasopharyngeal swab, presence of viral mutation(s) within the areas targeted by this assay, and inadequate number of viral copies(<138 copies/mL). A negative result must be combined with clinical observations, patient history, and epidemiological information. The expected result is Negative.  Fact Sheet for Patients:  BloggerCourse.com  Fact Sheet for Healthcare Providers:  SeriousBroker.it  This test is no t yet approved or cleared by the [place] FDA and  has been authorized for detection and/or diagnosis of SARS-CoV-2 by FDA under an Emergency Use Authorization (EUA). This EUA will remain  in effect (meaning this test can be used) for the duration of the COVID-19 declaration under Section 564(b)(1) of the Act, 21 U.S.C.section 360bbb-3(b)(1), unless the authorization is terminated  or revoked sooner.       Influenza A by PCR NEGATIVE NEGATIVE Final   Influenza B by PCR NEGATIVE NEGATIVE Final    Comment: (NOTE) The Xpert Xpress SARS-CoV-2/FLU/RSV plus assay is intended as an aid in the diagnosis of influenza from Nasopharyngeal swab specimens and should not be used as a sole basis for treatment. Nasal washings and aspirates are unacceptable for Xpert Xpress SARS-CoV-2/FLU/RSV testing.  Fact Sheet for Patients: BloggerCourse.com  Fact Sheet for Healthcare Providers: SeriousBroker.it  This test is not yet approved or cleared by the [place] FDA and has been authorized for detection and/or diagnosis of SARS-CoV-2 by FDA under an Emergency Use Authorization (EUA). This EUA will remain in effect (meaning this test can be used) for the duration of the COVID-19 declaration under Section 564(b)(1) of the Act, 21 U.S.C. section 360bbb-3(b)(1), unless the authorization is terminated or revoked.  Performed at [facility], [address] [place], [place] [address]       Scheduled Meds: . acetylcysteine  3 mL Nebulization BID  . aspirin EC  81 mg Oral Daily  . atorvastatin  40 mg Oral Daily  . bisoprolol  5 mg Oral Daily  . budesonide (PULMICORT) nebulizer solution  0.5 mg Nebulization BID  . enoxaparin (LOVENOX) injection  0.5 mg/kg Subcutaneous Q24H  . fluconazole  100 mg Oral Daily  . furosemide  40 mg Oral BID  . guaiFENesin  1,200 mg Oral BID  . insulin aspart  0-20 Units Subcutaneous TID WC  . insulin aspart  0-5 Units Subcutaneous QHS  . insulin aspart  8 Units Subcutaneous TID WC  . insulin glargine  24 Units Subcutaneous Daily  . ipratropium-albuterol  3 mL Nebulization TID  . ketoconazole   Topical BID  . predniSONE  20 mg Oral Q breakfast  . spironolactone  12.5 mg Oral Daily  . tamsulosin  0.4 mg Oral Daily  . triamcinolone cream   Topical TID   Brief history.  Patient admitted 01/04/2021  with shortness of breath and COPD exacerbation.  He had elevated troponin and initially was thought to have NSTEMI but now likely related to demand ischemia.  Patient also has acute on chronic diastolic congestive heart failure.  Patient also has uncontrolled type 2 diabetes mellitus with an elevated hemoglobin A1c of 10.1.  He was seen by cardiology Dr. Welton Flakes.  I will be giving IV Solu-Medrol by pulmonary changed over to prednisone on 01/09/2021.  Patient was diuresed with IV Lasix during the hospital course but changed to oral.  Assessment/Plan:  1. COPD exacerbation.  I have been giving daily Solu-Medrol since her sugars were high with the steroids.  I consulted pulmonary today because prolonged hospitalization and not improving much.  Continue Mucomyst, budesonide and DuoNeb nebulizer solution.  Patient completed empiric doxycycline.  Continue Tussionex.  Patient very slow to improve still having upper airway transmitted sounds. 2. Acute on chronic hypoxic respiratory failure.  Patient was  up at 6 L of oxygen during the hospital stay and now down to his baseline 2 L of oxygen. 3. Acute on chronic diastolic congestive heart failure with EF 60 to 65%.  Switched over from IV Lasix to p.o. Lasix.  Beta-blocker changed over to bisoprolol. 4. Elevated troponin likely demand ischemia 5. Uncontrolled type 2 diabetes mellitus with hyperlipidemia.  On Lantus 24 units daily while on Solu-Medrol may be able to decrease when tapering steroids.  Hemoglobin A1c elevated at 10.1. 6. Rash on the back.  I have on oral Diflucan because he states it goes away when he is on fungal oral medications.  I have on ketoconazole cream and triamcinolone cream 7. Obesity with a BMI of 39.09. 8. Difficulty getting urine out started empirically on Flomax        Code Status:     Code Status Orders  (From admission, onward)         Start     Ordered   01/04/21 0339  Full code  Continuous        01/04/21 0347        Code Status History    Date Active Date Inactive Code Status Order ID Comments User Context   06/20/2020 1959 06/25/2020 0032 Full Code [id number]  Rometta Emery, MD ED   02/25/2018 1608 02/28/2018 0724 Full Code [id number]  Katha Hamming, MD ED   Advance Care Planning Activity     Disposition Plan: Status is: Inpatient  Dispo: The patient is from: Home              Anticipated d/c is to: Home in another day or so depending on course              Patient currently very slow to improve with COPD exacerbation   Difficult to place patient.  No.  Consultants:  Pulmonary  Cardiology  Antibiotics:  Completed doxycycline  Time spent: 28 minutes  Laterica Matarazzo [employer]
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
## 2021-01-10 DIAGNOSIS — J441 Chronic obstructive pulmonary disease with (acute) exacerbation: Secondary | ICD-10-CM | POA: Diagnosis not present

## 2021-01-10 DIAGNOSIS — J9621 Acute and chronic respiratory failure with hypoxia: Secondary | ICD-10-CM | POA: Diagnosis not present

## 2021-01-10 DIAGNOSIS — I5033 Acute on chronic diastolic (congestive) heart failure: Secondary | ICD-10-CM | POA: Diagnosis not present

## 2021-01-10 LAB — BASIC METABOLIC PANEL
Anion gap: 9 (ref 5–15)
BUN: 26 mg/dL — ABNORMAL HIGH (ref 8–23)
CO2: 35 mmol/L — ABNORMAL HIGH (ref 22–32)
Calcium: 9.1 mg/dL (ref 8.9–10.3)
Chloride: 90 mmol/L — ABNORMAL LOW (ref 98–111)
Creatinine, Ser: 1.21 mg/dL (ref 0.61–1.24)
GFR, Estimated: >60 mL/min (ref >60)
Glucose, Bld: 227 mg/dL — ABNORMAL HIGH (ref 70–99)
Potassium: 4.4 mmol/L (ref 3.5–5.1)
Sodium: 134 mmol/L — ABNORMAL LOW (ref 135–145)

## 2021-01-10 LAB — GLUCOSE, CAPILLARY
Glucose-Capillary: 148 mg/dL — ABNORMAL HIGH (ref 70–99)
Glucose-Capillary: 242 mg/dL — ABNORMAL HIGH (ref 70–99)
Glucose-Capillary: 264 mg/dL — ABNORMAL HIGH (ref 70–99)
Glucose-Capillary: 216 mg/dL — ABNORMAL HIGH (ref 70–99)

## 2021-01-10 MED ORDER — IPRATROPIUM-ALBUTEROL 0.5-2.5 (3) MG/3ML IN SOLN
3.0000 mL | RESPIRATORY_TRACT | Status: DC
  Administered 2021-01-10 – 2021-01-14 (×24): 3 mL via RESPIRATORY_TRACT
  Filled 2021-01-10 (×25): qty 3

## 2021-01-10 MED ORDER — ARFORMOTEROL TARTRATE 15 MCG/2ML IN NEBU
15.0000 ug | INHALATION_SOLUTION | Freq: Two times a day (BID) | RESPIRATORY_TRACT | Status: DC
  Administered 2021-01-10 – 2021-01-15 (×11): 15 ug via RESPIRATORY_TRACT
  Filled 2021-01-10 (×12): qty 2

## 2021-01-10 NOTE — Progress Notes (Signed)
Physical Therapy Treatment Patient Details Name: Cody Alexander MRN: [id number] DOB: 1955-10-17 Today's Date: 01/10/2021    History of Present Illness 65 y.o. male with medical history significant for COPD on home O2 at 2 L, obesity, OSA, HTN, mild CAD on cardiac cath 02/2018, diastolic heart failure, last EF 55% November 2021, hospitalized in February in [place], for CHF exacerbation with NSTEMI, who presents by EMS with shortness of breath and wheezing that started the day prior.  Pt also recently treated for UTI, apparently had stopped taking antibiotics.    PT Comments    Pt seen this pm for continued mobility.  95% on 2L O2 at rest upon arrival. Pt demonstrated Modified independence with bed mobility and transfers.  Gait training on level surface while pulling O2 tank x 221ft with vc's for PLB technique and pacing.  O2 sats dropped to 88%, returned to mid 90'2 after 2 minute seated rest break.  Pt given information on where to be fitted for new diabetic shoes.  Continue per POC, Home with HHPT.   Follow Up Recommendations  Home health PT     Equipment Recommendations       Recommendations for Other Services       Precautions / Restrictions Precautions Precautions: Fall Precaution Comments:  (Pt on 2L O2 sats at rest 95%, upon returning from ambulation, sats at 88% which recovered in 2 minutes with seated rest break)    Mobility  Bed Mobility Overal bed mobility: Modified Independent             General bed mobility comments: Mod I with increased reliance on UE support and momentum to achieve sitting at EOB    Transfers Overall transfer level: Modified independent Equipment used: None             General transfer comment: patient using UE support to stand from bed in the lowest height position. no physical assistance required.  Ambulation/Gait Ambulation/Gait assistance: Supervision Gait Distance (Feet): 200 Feet Assistive device: None (pulling O2  tank) Gait Pattern/deviations: Step-through pattern Gait velocity: decreased   General Gait Details:  (Pt declined cane)   Stairs             Wheelchair Mobility    Modified Rankin (Stroke Patients Only)       Balance                                            Cognition Arousal/Alertness: Awake/alert Behavior During Therapy: WFL for tasks assessed/performed Overall Cognitive Status: Within Functional Limits for tasks assessed                                 General Comments: Able to follow 100% of simple 2 step commands      Exercises      General Comments General comments (skin integrity, edema, etc.):  (Education provided for PLB technique and energy conservation. Info given on where to purchase Diabetic shoes)      Pertinent Vitals/Pain Pain Assessment: No/denies pain    Home Living                      Prior Function            PT Goals (current goals can now be found in the care plan section) Acute  Rehab PT Goals Patient Stated Goal: to go home    Frequency    Min 2X/week      PT Plan Current plan remains appropriate    Co-evaluation              AM-PAC PT "6 Clicks" Mobility   Outcome Measure  Help needed turning from your back to your side while in a flat bed without using bedrails?: None Help needed moving from lying on your back to sitting on the side of a flat bed without using bedrails?: None Help needed moving to and from a bed to a chair (including a wheelchair)?: None Help needed standing up from a chair using your arms (e.g., wheelchair or bedside chair)?: None Help needed to walk in hospital room?: A Little Help needed climbing 3-5 steps with a railing? : A Little 6 Click Score: 22    End of Session Equipment Utilized During Treatment: Oxygen Activity Tolerance: Patient tolerated treatment well Patient left: with bed alarm set;with call bell/phone within reach;in bed Nurse  Communication: Mobility status PT Visit Diagnosis: Unsteadiness on feet (R26.81);Muscle weakness (generalized) (M62.81)     Time: 1600-1640 PT Time Calculation (min) (ACUTE ONLY): 40 min  Charges:  $Gait Training: 23-37 mins $Therapeutic Activity: 8-22 mins                     Zadie Cleverly, PTA    Jannet Askew 01/10/2021, 5:55 PM

## 2021-01-10 NOTE — Progress Notes (Signed)
PROGRESS NOTE    Cody Alexander  TFT:[id number] DOB: September 05, 1955 DOA: 01/04/2021 PCP: Mckinley Jewel, FNP   Brief Narrative:  65 year old male with known COPD presents for COPD exacerbation and acute hypoxic respiratory failure.  Slow to improve.  Pulmonology consulted on 5/17.  Assessment & Plan:   Active Problems:   NSTEMI (non-ST elevated myocardial infarction) (HCC)   COPD exacerbation (HCC)   Essential hypertension   Acute on chronic respiratory failure with hypoxia (HCC)   Uncontrolled type 2 diabetes mellitus with hyperglycemia (HCC)   Acute on chronic diastolic CHF (congestive heart failure) (HCC)   OSA (obstructive sleep apnea)   Obesity (BMI 30-39.9)   Chronic respiratory failure with hypoxia (HCC)   Elevated troponin   Rash   Lower abdominal pain   Benign prostatic hyperplasia with urinary hesitancy  COPD exacerbation Acute on chronic hypoxic respiratory failure Patient is little improved Pulmonology consulted, recommendations appreciated Plan: Switched IV steroids 40 mg every 8 hours DuoNebs every 4 hours Brovana twice daily Pulmicort twice daily Mucomyst nebulizer As needed cough suppression Wean oxygen as tolerated, patient requiring 2 L  Acute on chronic diastolic congestive heart failure Volume status improved On p.o. Lasix On p.o. bisoprolol  Elevated troponin Suspect supply demand ischemia No plans for ischemic evaluation  Type 2 diabetes mellitus, uncontrolled with hyperlipidemia Basal bolus regimen Hemoglobin A1c 10.1, poor control  Rash on back Improved with oral Diflucan Also on ketoconazole Pramosone cream  Obesity This complicates overall care and prognosis     DVT prophylaxis: SQ Lovenox  code Status: Full Family Communication: None today Disposition Plan: Status is: Inpatient  Remains inpatient appropriate because:Inpatient level of care appropriate due to severity of illness and Persistent symptoms   Dispo: The  patient is from: Home              Anticipated d/c is to: Home              Patient currently is not medically stable to d/c.   Difficult to place patient No  Persistent respiratory symptoms in setting of COPD exacerbation     Level of care: Progressive Cardiac  Consultants:   Pulmonary  Procedures:   None  Antimicrobials:  None   Subjective: Seen and examined.  Endorses continued cough and shortness of breath.  Associated with sharp chest wall pain.  Objective: Vitals:   01/10/21 0749 01/10/21 0846 01/10/21 1304 01/10/21 1343  BP: (!) 138/91   126/81  Pulse: 61   80  Resp: 18   16  Temp: 97.9 F (36.6 C)   98.8 F (37.1 C)  TempSrc: Oral   Oral  SpO2: 96% 96% 91% 91%  Weight:      Height:        Intake/Output Summary (Last 24 hours) at 01/10/2021 1515 Last data filed at 01/10/2021 1354 Gross per 24 hour  Intake 1150 ml  Output --  Net 1150 ml   Filed Weights   01/07/21 0600 01/08/21 0756 01/09/21 0423  Weight: 135.4 kg 134.5 kg 134.4 kg    Examination:  General exam: Appears calm and comfortable  Respiratory system: Severe coarse crackles bilaterally.  Scattered end expiratory wheeze normal work of breathing.  2 L Cardiovascular system: S1 & S2 heard, RRR. No JVD, murmurs, rubs, gallops or clicks. No pedal edema. Gastrointestinal system: Abdomen is nondistended, soft and nontender. No organomegaly or masses felt. Normal bowel sounds heard. Central nervous system: Alert and oriented. No focal neurological deficits. Extremities: Symmetric 5  x 5 power. Skin: No rashes, lesions or ulcers Psychiatry: Judgement and insight appear normal. Mood & affect appropriate.     Data Reviewed: I have personally reviewed following labs and imaging studies  CBC: Recent Labs  Lab 01/04/21 0220  WBC 12.7*  NEUTROABS 9.3*  HGB 13.5  HCT 42.1  MCV 92.7  PLT 233   Basic Metabolic Panel: Recent Labs  Lab 01/06/21 0450 01/07/21 0447 01/08/21 0353  01/09/21 0439 01/10/21 1028  NA 135 136 136 137 134*  K 4.2 4.4 4.6 4.9 4.4  CL 93* 92* 89* 92* 90*  CO2 35* 36* 38* 37* 35*  GLUCOSE 181* 196* 225* 203* 227*  BUN 27* 27* 26* 28* 26*  CREATININE 1.02 0.99 1.02 1.15 1.21  CALCIUM 8.5* 8.5* 8.9 8.7* 9.1   GFR: Estimated Creatinine Clearance: 89.9 mL/min (by C-G formula based on SCr of 1.21 mg/dL). Liver Function Tests: Recent Labs  Lab 01/04/21 0220  AST 17  ALT 18  ALKPHOS 56  BILITOT 0.8  PROT 6.4*  ALBUMIN 3.6   No results for input(s): LIPASE, AMYLASE in the last 168 hours. No results for input(s): AMMONIA in the last 168 hours. Coagulation Profile: Recent Labs  Lab 01/04/21 0220  INR 0.9   Cardiac Enzymes: Recent Labs  Lab 01/06/21 0450  CKTOTAL 151   BNP (last 3 results) No results for input(s): PROBNP in the last 8760 hours. HbA1C: No results for input(s): HGBA1C in the last 72 hours. CBG: Recent Labs  Lab 01/09/21 1138 01/09/21 1631 01/09/21 2119 01/10/21 0750 01/10/21 1221  GLUCAP 244* 125* 225* 148* 242*   Lipid Profile: No results for input(s): CHOL, HDL, LDLCALC, TRIG, CHOLHDL, LDLDIRECT in the last 72 hours. Thyroid Function Tests: No results for input(s): TSH, T4TOTAL, FREET4, T3FREE, THYROIDAB in the last 72 hours. Anemia Panel: No results for input(s): VITAMINB12, FOLATE, FERRITIN, TIBC, IRON, RETICCTPCT in the last 72 hours. Sepsis Labs: No results for input(s): PROCALCITON, LATICACIDVEN in the last 168 hours.  Recent Results (from the past 240 hour(s))  Resp Panel by RT-PCR (Flu A&B, Covid) Nasopharyngeal Swab     Status: None   Collection Time: 01/04/21  2:21 AM   Specimen: Nasopharyngeal Swab; Nasopharyngeal(NP) swabs in vial transport medium  Result Value Ref Range Status   SARS Coronavirus 2 by RT PCR NEGATIVE NEGATIVE Final    Comment: (NOTE) SARS-CoV-2 target nucleic acids are NOT DETECTED.  The SARS-CoV-2 RNA is generally detectable in upper respiratory specimens during  the acute phase of infection. The lowest concentration of SARS-CoV-2 viral copies this assay can detect is 138 copies/mL. A negative result does not preclude SARS-Cov-2 infection and should not be used as the sole basis for treatment or other patient management decisions. A negative result may occur with  improper specimen collection/handling, submission of specimen other than nasopharyngeal swab, presence of viral mutation(s) within the areas targeted by this assay, and inadequate number of viral copies(<138 copies/mL). A negative result must be combined with clinical observations, patient history, and epidemiological information. The expected result is Negative.  Fact Sheet for Patients:  BloggerCourse.com  Fact Sheet for Healthcare Providers:  SeriousBroker.it  This test is no t yet approved or cleared by the [place] FDA and  has been authorized for detection and/or diagnosis of SARS-CoV-2 by FDA under an Emergency Use Authorization (EUA). This EUA will remain  in effect (meaning this test can be used) for the duration of the COVID-19 declaration under Section 564(b)(1) of the Act, 21  U.S.C.section 360bbb-3(b)(1), unless the authorization is terminated  or revoked sooner.       Influenza A by PCR NEGATIVE NEGATIVE Final   Influenza B by PCR NEGATIVE NEGATIVE Final    Comment: (NOTE) The Xpert Xpress SARS-CoV-2/FLU/RSV plus assay is intended as an aid in the diagnosis of influenza from Nasopharyngeal swab specimens and should not be used as a sole basis for treatment. Nasal washings and aspirates are unacceptable for Xpert Xpress SARS-CoV-2/FLU/RSV testing.  Fact Sheet for Patients: BloggerCourse.com  Fact Sheet for Healthcare Providers: SeriousBroker.it  This test is not yet approved or cleared by the [place] FDA and has been authorized for detection and/or  diagnosis of SARS-CoV-2 by FDA under an Emergency Use Authorization (EUA). This EUA will remain in effect (meaning this test can be used) for the duration of the COVID-19 declaration under Section 564(b)(1) of the Act, 21 U.S.C. section 360bbb-3(b)(1), unless the authorization is terminated or revoked.  Performed at [facility], [address], [place], [place] [address]          Radiology Studies: No results found.      Scheduled Meds: . acetylcysteine  3 mL Nebulization BID  . arformoterol  15 mcg Nebulization BID  . aspirin EC  81 mg Oral Daily  . atorvastatin  40 mg Oral Daily  . bisoprolol  2.5 mg Oral Daily  . budesonide (PULMICORT) nebulizer solution  0.5 mg Nebulization BID  . enoxaparin (LOVENOX) injection  0.5 mg/kg Subcutaneous Q24H  . fluconazole  100 mg Oral Daily  . furosemide  40 mg Oral BID  . guaiFENesin  1,200 mg Oral BID  . insulin aspart  0-20 Units Subcutaneous TID WC  . insulin aspart  0-5 Units Subcutaneous QHS  . insulin aspart  8 Units Subcutaneous TID WC  . insulin glargine  20 Units Subcutaneous Daily  . ipratropium-albuterol  3 mL Nebulization Q4H  . ketoconazole   Topical BID  . predniSONE  20 mg Oral Q breakfast  . spironolactone  12.5 mg Oral Daily  . tamsulosin  0.4 mg Oral Daily  . triamcinolone cream   Topical TID   Continuous Infusions:   LOS: 6 days    Time spent: 25 minutes    Tresa Moore, MD [facility] Pager [phone]  If 7PM-7AM, please contact night-coverage 01/10/2021, 3:15 PM
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
## 2021-01-11 ENCOUNTER — Ambulatory Visit: Admitting: Family

## 2021-01-11 DIAGNOSIS — I5033 Acute on chronic diastolic (congestive) heart failure: Secondary | ICD-10-CM | POA: Diagnosis not present

## 2021-01-11 DIAGNOSIS — J441 Chronic obstructive pulmonary disease with (acute) exacerbation: Secondary | ICD-10-CM | POA: Diagnosis not present

## 2021-01-11 DIAGNOSIS — J9621 Acute and chronic respiratory failure with hypoxia: Secondary | ICD-10-CM | POA: Diagnosis not present

## 2021-01-11 LAB — GLUCOSE, CAPILLARY
Glucose-Capillary: 185 mg/dL — ABNORMAL HIGH (ref 70–99)
Glucose-Capillary: 186 mg/dL — ABNORMAL HIGH (ref 70–99)
Glucose-Capillary: 283 mg/dL — ABNORMAL HIGH (ref 70–99)
Glucose-Capillary: 275 mg/dL — ABNORMAL HIGH (ref 70–99)

## 2021-01-11 MED ORDER — INSULIN ASPART 100 UNIT/ML IJ SOLN
10.0000 [IU] | Freq: Three times a day (TID) | INTRAMUSCULAR | Status: DC
  Administered 2021-01-11 – 2021-01-13 (×4): 10 [IU] via SUBCUTANEOUS
  Filled 2021-01-11 (×4): qty 1

## 2021-01-11 MED ORDER — AMLODIPINE BESYLATE 5 MG PO TABS
5.0000 mg | ORAL_TABLET | Freq: Every day | ORAL | Status: DC
  Administered 2021-01-11 – 2021-01-15 (×5): 5 mg via ORAL
  Filled 2021-01-11 (×5): qty 1

## 2021-01-11 NOTE — Progress Notes (Signed)
Mobility Specialist - Progress Note   01/11/21 1100  Mobility  Activity Ambulated in hall  Level of Assistance Modified independent, requires aide device or extra time  Assistive Device None  Distance Ambulated (ft) 380 ft  Mobility Ambulated independently in hallway  Mobility Response Tolerated well  Mobility performed by Mobility specialist  $Mobility charge 1 Mobility    Pre-mobility: 87 HR, 94% SpO2 During mobility: 89 HR, 95% SpO2 Post-mobility: 93 HR, 93% SpO2   Pt ambulated in hallway mod-independently. No LOB. Several short standing breaks taken. Voices back pain 5/10 and soreness in LE during ambulation. Mild SOB on 2L. O2 maintained high 90s throughout activity, but did desat to 91% once returned EOB. PLB engaged. Mildly winded. Pt aware of activity tolerance and able to self-determine stopping points.    Cody Alexander Mobility Specialist 01/11/21, 11:11 AM

## 2021-01-11 NOTE — Progress Notes (Signed)
PROGRESS NOTE    Cody Alexander  YQM:[id number] DOB: 01-31-1956 DOA: 01/04/2021 PCP: Mckinley Jewel, FNP   Brief Narrative:  65 year old male with known COPD presents for COPD exacerbation and acute hypoxic respiratory failure.  Slow to improve.  Pulmonology consulted on 5/17.  Assessment & Plan:   Active Problems:   NSTEMI (non-ST elevated myocardial infarction) (HCC)   COPD exacerbation (HCC)   Essential hypertension   Acute on chronic respiratory failure with hypoxia (HCC)   Uncontrolled type 2 diabetes mellitus with hyperglycemia (HCC)   Acute on chronic diastolic CHF (congestive heart failure) (HCC)   OSA (obstructive sleep apnea)   Obesity (BMI 30-39.9)   Chronic respiratory failure with hypoxia (HCC)   Elevated troponin   Rash   Lower abdominal pain   Benign prostatic hyperplasia with urinary hesitancy  COPD exacerbation Acute on chronic hypoxic respiratory failure Patient starting to improve Pulmonology consulted, recommendations appreciated Plan: Prednisone 20 mg daily DuoNebs every 4 hours Brovana twice daily Pulmicort twice daily Mucomyst nebulizer As needed cough suppression Wean oxygen as tolerated, patient requiring 2 L  Acute on chronic diastolic congestive heart failure Volume status improved On p.o. Lasix On p.o. bisoprolol  Elevated troponin Suspect supply demand ischemia No plans for ischemic evaluation  Type 2 diabetes mellitus, uncontrolled with hyperlipidemia Basal bolus regimen Hemoglobin A1c 10.1, poor control  Rash on back Improved with oral Diflucan Also on ketoconazole Pramosone cream  Obesity This complicates overall care and prognosis     DVT prophylaxis: SQ Lovenox  code Status: Full Family Communication: None today Disposition Plan: Status is: Inpatient  Remains inpatient appropriate because:Inpatient level of care appropriate due to severity of illness and Persistent symptoms   Dispo: The patient is from:  Home              Anticipated d/c is to: Home              Patient currently is not medically stable to d/c.   Difficult to place patient No  Persistent respiratory symptoms.  COPD flare.  Starting to improve.  Possible discharge in 48 hours.     Level of care: Progressive Cardiac  Consultants:   Pulmonary  Procedures:   None  Antimicrobials:  None   Subjective: Patient seen and examined.  Cough and shortness of breath starting to improve.  Objective: Vitals:   01/11/21 0444 01/11/21 0810 01/11/21 0812 01/11/21 1152  BP:  (!) 131/115 (!) 134/99 121/78  Pulse: 72 72  78  Resp: 16 17  18   Temp:  97.9 F (36.6 C)  98.2 F (36.8 C)  TempSrc:  Oral  Oral  SpO2: 94% 99%  97%  Weight:   133.2 kg   Height:        Intake/Output Summary (Last 24 hours) at 01/11/2021 1319 Last data filed at 01/11/2021 1154 Gross per 24 hour  Intake 1080 ml  Output 2075 ml  Net -995 ml   Filed Weights   01/09/21 0423 01/11/21 0408 01/11/21 0812  Weight: 134.4 kg 135.2 kg 133.2 kg    Examination:  General exam: Appears calm and comfortable  Respiratory system: Severe coarse crackles bilaterally.  Scattered end expiratory wheeze normal work of breathing.  2 L Cardiovascular system: S1 & S2 heard, RRR. No JVD, murmurs, rubs, gallops or clicks. No pedal edema. Gastrointestinal system: Abdomen is nondistended, soft and nontender. No organomegaly or masses felt. Normal bowel sounds heard. Central nervous system: Alert and oriented. No focal neurological deficits. Extremities:  Symmetric 5 x 5 power. Skin: No rashes, lesions or ulcers Psychiatry: Judgement and insight appear normal. Mood & affect appropriate.     Data Reviewed: I have personally reviewed following labs and imaging studies  CBC: No results for input(s): WBC, NEUTROABS, HGB, HCT, MCV, PLT in the last 168 hours. Basic Metabolic Panel: Recent Labs  Lab 01/06/21 0450 01/07/21 0447 01/08/21 0353 01/09/21 0439  01/10/21 1028  NA 135 136 136 137 134*  K 4.2 4.4 4.6 4.9 4.4  CL 93* 92* 89* 92* 90*  CO2 35* 36* 38* 37* 35*  GLUCOSE 181* 196* 225* 203* 227*  BUN 27* 27* 26* 28* 26*  CREATININE 1.02 0.99 1.02 1.15 1.21  CALCIUM 8.5* 8.5* 8.9 8.7* 9.1   GFR: Estimated Creatinine Clearance: 88.3 mL/min (by C-G formula based on SCr of 1.21 mg/dL). Liver Function Tests: No results for input(s): AST, ALT, ALKPHOS, BILITOT, PROT, ALBUMIN in the last 168 hours. No results for input(s): LIPASE, AMYLASE in the last 168 hours. No results for input(s): AMMONIA in the last 168 hours. Coagulation Profile: No results for input(s): INR, PROTIME in the last 168 hours. Cardiac Enzymes: Recent Labs  Lab 01/06/21 0450  CKTOTAL 151   BNP (last 3 results) No results for input(s): PROBNP in the last 8760 hours. HbA1C: No results for input(s): HGBA1C in the last 72 hours. CBG: Recent Labs  Lab 01/10/21 1221 01/10/21 1627 01/10/21 2128 01/11/21 0810 01/11/21 1150  GLUCAP 242* 264* 216* 185* 186*   Lipid Profile: No results for input(s): CHOL, HDL, LDLCALC, TRIG, CHOLHDL, LDLDIRECT in the last 72 hours. Thyroid Function Tests: No results for input(s): TSH, T4TOTAL, FREET4, T3FREE, THYROIDAB in the last 72 hours. Anemia Panel: No results for input(s): VITAMINB12, FOLATE, FERRITIN, TIBC, IRON, RETICCTPCT in the last 72 hours. Sepsis Labs: No results for input(s): PROCALCITON, LATICACIDVEN in the last 168 hours.  Recent Results (from the past 240 hour(s))  Resp Panel by RT-PCR (Flu A&B, Covid) Nasopharyngeal Swab     Status: None   Collection Time: 01/04/21  2:21 AM   Specimen: Nasopharyngeal Swab; Nasopharyngeal(NP) swabs in vial transport medium  Result Value Ref Range Status   SARS Coronavirus 2 by RT PCR NEGATIVE NEGATIVE Final    Comment: (NOTE) SARS-CoV-2 target nucleic acids are NOT DETECTED.  The SARS-CoV-2 RNA is generally detectable in upper respiratory specimens during the acute phase of  infection. The lowest concentration of SARS-CoV-2 viral copies this assay can detect is 138 copies/mL. A negative result does not preclude SARS-Cov-2 infection and should not be used as the sole basis for treatment or other patient management decisions. A negative result may occur with  improper specimen collection/handling, submission of specimen other than nasopharyngeal swab, presence of viral mutation(s) within the areas targeted by this assay, and inadequate number of viral copies(<138 copies/mL). A negative result must be combined with clinical observations, patient history, and epidemiological information. The expected result is Negative.  Fact Sheet for Patients:  BloggerCourse.com  Fact Sheet for Healthcare Providers:  SeriousBroker.it  This test is no t yet approved or cleared by the [place] FDA and  has been authorized for detection and/or diagnosis of SARS-CoV-2 by FDA under an Emergency Use Authorization (EUA). This EUA will remain  in effect (meaning this test can be used) for the duration of the COVID-19 declaration under Section 564(b)(1) of the Act, 21 U.S.C.section 360bbb-3(b)(1), unless the authorization is terminated  or revoked sooner.       Influenza A by  PCR NEGATIVE NEGATIVE Final   Influenza B by PCR NEGATIVE NEGATIVE Final    Comment: (NOTE) The Xpert Xpress SARS-CoV-2/FLU/RSV plus assay is intended as an aid in the diagnosis of influenza from Nasopharyngeal swab specimens and should not be used as a sole basis for treatment. Nasal washings and aspirates are unacceptable for Xpert Xpress SARS-CoV-2/FLU/RSV testing.  Fact Sheet for Patients: BloggerCourse.com  Fact Sheet for Healthcare Providers: SeriousBroker.it  This test is not yet approved or cleared by the [place] FDA and has been authorized for detection and/or diagnosis of SARS-CoV-2  by FDA under an Emergency Use Authorization (EUA). This EUA will remain in effect (meaning this test can be used) for the duration of the COVID-19 declaration under Section 564(b)(1) of the Act, 21 U.S.C. section 360bbb-3(b)(1), unless the authorization is terminated or revoked.  Performed at [facility], [address], [place], [place] [address]          Radiology Studies: No results found.      Scheduled Meds: . amLODipine  5 mg Oral Daily  . arformoterol  15 mcg Nebulization BID  . aspirin EC  81 mg Oral Daily  . atorvastatin  40 mg Oral Daily  . bisoprolol  2.5 mg Oral Daily  . enoxaparin (LOVENOX) injection  0.5 mg/kg Subcutaneous Q24H  . furosemide  40 mg Oral BID  . guaiFENesin  1,200 mg Oral BID  . insulin aspart  0-20 Units Subcutaneous TID WC  . insulin aspart  0-5 Units Subcutaneous QHS  . insulin aspart  10 Units Subcutaneous TID WC  . insulin glargine  20 Units Subcutaneous Daily  . ipratropium-albuterol  3 mL Nebulization Q4H  . ketoconazole   Topical BID  . predniSONE  20 mg Oral Q breakfast  . spironolactone  12.5 mg Oral Daily  . tamsulosin  0.4 mg Oral Daily  . triamcinolone cream   Topical TID   Continuous Infusions:   LOS: 7 days    Time spent: 15 minutes    Tresa Moore, MD [facility] Pager [phone]  If 7PM-7AM, please contact night-coverage 01/11/2021, 1:19 PM

## 2021-01-11 NOTE — TOC Progression Note (Addendum)
Transition of Care [facility]) - Progression Note    Patient Details  Name: Cody Alexander MRN: [id number] Date of Birth: [birth year]  Transition of Care [facility]) CM/SW Contact  Gildardo Griffes, [place] Phone Number: 01/11/2021, 9:20 AM  Clinical Narrative:     Patient has a concentrator at home, and portable tanks that he fills.  Patient will be going back to the home that he was admitted from  Patient is unsure if he will have a ride at discharge. Will attempt to call a friend when ready to discharge and if they will be able to bring his portable tank   Patient agreeable for home health services. Agencies that have been contacted include [facility], [facility], [facility], and [facility] to see if they are in network with patient's insurance. As of this time no home health agencies have been able to accept patient due to insurance.     Expected Discharge Plan: Home w Home Health Services Barriers to Discharge: Continued Medical Work up  Expected Discharge Plan and Services Expected Discharge Plan: Home w Home Health Services In-house Referral: Clinical Social Work   Post Acute Care Choice: Home Health Living arrangements for the past 2 months: Single Family Home                             HH Agency: NA (Pending.)         Social Determinants of Health (SDOH) Interventions    Readmission Risk Interventions No flowsheet data found.
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
## 2021-01-12 DIAGNOSIS — I5033 Acute on chronic diastolic (congestive) heart failure: Secondary | ICD-10-CM | POA: Diagnosis not present

## 2021-01-12 DIAGNOSIS — J441 Chronic obstructive pulmonary disease with (acute) exacerbation: Secondary | ICD-10-CM | POA: Diagnosis not present

## 2021-01-12 DIAGNOSIS — J9621 Acute and chronic respiratory failure with hypoxia: Secondary | ICD-10-CM | POA: Diagnosis not present

## 2021-01-12 LAB — GLUCOSE, CAPILLARY
Glucose-Capillary: 158 mg/dL — ABNORMAL HIGH (ref 70–99)
Glucose-Capillary: 83 mg/dL (ref 70–99)
Glucose-Capillary: 332 mg/dL — ABNORMAL HIGH (ref 70–99)
Glucose-Capillary: 211 mg/dL — ABNORMAL HIGH (ref 70–99)

## 2021-01-12 LAB — CBC
HCT: 44.1 % (ref 39.0–52.0)
Hemoglobin: 14.2 g/dL (ref 13.0–17.0)
MCH: 29.6 pg (ref 26.0–34.0)
MCHC: 32.2 g/dL (ref 30.0–36.0)
MCV: 92.1 fL (ref 80.0–100.0)
Platelets: 236 10*3/uL (ref 150–400)
RBC: 4.79 MIL/uL (ref 4.22–5.81)
RDW: 13.1 % (ref 11.5–15.5)
WBC: 9.2 10*3/uL (ref 4.0–10.5)
nRBC: 0 % (ref 0.0–0.2)

## 2021-01-12 MED ORDER — DM-GUAIFENESIN ER 30-600 MG PO TB12
2.0000 | ORAL_TABLET | Freq: Two times a day (BID) | ORAL | Status: DC
  Administered 2021-01-12 – 2021-01-15 (×6): 2 via ORAL
  Filled 2021-01-12: qty 1
  Filled 2021-01-12 (×2): qty 2
  Filled 2021-01-12: qty 1
  Filled 2021-01-12: qty 2
  Filled 2021-01-12: qty 1
  Filled 2021-01-12: qty 2
  Filled 2021-01-12: qty 1

## 2021-01-12 MED ORDER — HYDROCOD POLST-CPM POLST ER 10-8 MG/5ML PO SUER
5.0000 mL | Freq: Three times a day (TID) | ORAL | Status: DC
  Administered 2021-01-12 – 2021-01-15 (×10): 5 mL via ORAL
  Filled 2021-01-12 (×10): qty 5

## 2021-01-12 MED ORDER — DEXTROMETHORPHAN POLISTIREX ER 30 MG/5ML PO SUER
60.0000 mg | Freq: Once | ORAL | Status: AC
  Administered 2021-01-12: 60 mg via ORAL
  Filled 2021-01-12: qty 10

## 2021-01-12 MED ORDER — BENZONATATE 100 MG PO CAPS
200.0000 mg | ORAL_CAPSULE | Freq: Three times a day (TID) | ORAL | Status: DC
  Administered 2021-01-12 – 2021-01-15 (×10): 200 mg via ORAL
  Filled 2021-01-12 (×10): qty 2

## 2021-01-12 MED ORDER — KETOROLAC TROMETHAMINE 30 MG/ML IJ SOLN
15.0000 mg | Freq: Four times a day (QID) | INTRAMUSCULAR | Status: DC
  Administered 2021-01-12 – 2021-01-13 (×4): 15 mg via INTRAVENOUS
  Filled 2021-01-12 (×5): qty 1
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
## 2021-01-12 NOTE — Progress Notes (Signed)
Physical Therapy Treatment Patient Details Name: Cody Alexander MRN: [id number] DOB: 04-13-1956 Today's Date: 01/12/2021    History of Present Illness 65 y.o. male with medical history significant for COPD on home O2 at 2 L, obesity, OSA, HTN, mild CAD on cardiac cath 02/2018, diastolic heart failure, last EF 55% November 2021, hospitalized in February in [place], for CHF exacerbation with NSTEMI, who presents by EMS with shortness of breath and wheezing that started the day prior.  Pt also recently treated for UTI, apparently had stopped taking antibiotics.    PT Comments    Pt seen for PT treatment. Pt initially on 3L/min via nasal cannula, increased to 4L/min during gait, but decreased back to 3L/min before initiating stairs 2/2 improvement in SPO2. Lowest SPO2 during session 86%. PT educates pt on pursed lip breathing during activity. Pt is able to ambulate 4 laps around nurses station pushing IV pole to simulate SPC (pt reports he used [facility] prior to admission but it broke) with mod I. Pt does require standing rest break after ambulating each lap 2/2 SOB. Pt negotiates 7 steps x 2 trials with B rails & supervision with PT instructing pt on step to pattern but poor demo despite pt noting LLE weakness. Upgraded pt's stair goal to mod I due to pt living alone & will need to be mod I level. Will continue to follow pt acutely to address stair negotiation and endurance tolerance.     Follow Up Recommendations  Home health PT     Equipment Recommendations  Cane    Recommendations for Other Services       Precautions / Restrictions Precautions Precautions: Fall Restrictions Weight Bearing Restrictions: No    Mobility  Bed Mobility Overal bed mobility: Modified Independent                  Transfers Overall transfer level: Modified independent                  Ambulation/Gait Ambulation/Gait assistance: Modified independent (Device/Increase time) Gait Distance  (Feet): 670 Feet Assistive device: IV Pole Gait Pattern/deviations: WFL(Within Functional Limits)         Stairs Stairs: Yes Stairs assistance: Supervision Stair Management: Two rails Number of Stairs: 7 (+7) General stair comments: step over step pattern despite education for step to pattern as pt reports LLE weakness   Wheelchair Mobility    Modified Rankin (Stroke Patients Only)       Balance Overall balance assessment: Needs assistance Sitting-balance support: No upper extremity supported Sitting balance-Leahy Scale: Normal       Standing balance-Leahy Scale: Good                              Cognition Arousal/Alertness: Awake/alert Behavior During Therapy: WFL for tasks assessed/performed Overall Cognitive Status: Within Functional Limits for tasks assessed                                        Exercises      General Comments General comments (skin integrity, edema, etc.): Assisted pt with getting new acapella flutter valve, reviewed frequency of use of device.      Pertinent Vitals/Pain Pain Assessment: Faces Faces Pain Scale: Hurts little more Pain Location: soreness in lungs 2/2 coughing Pain Descriptors / Indicators: Sore Pain Intervention(s): Monitored during session    Home Living  Prior Function            PT Goals (current goals can now be found in the care plan section) Acute Rehab PT Goals Patient Stated Goal: to go home PT Goal Formulation: With patient Time For Goal Achievement: 01/18/21 Potential to Achieve Goals: Fair Progress towards PT goals: Progressing toward goals    Frequency    Min 2X/week      PT Plan Current plan remains appropriate    Co-evaluation              AM-PAC PT "6 Clicks" Mobility   Outcome Measure  Help needed turning from your back to your side while in a flat bed without using bedrails?: None Help needed moving from lying on your  back to sitting on the side of a flat bed without using bedrails?: None Help needed moving to and from a bed to a chair (including a wheelchair)?: None Help needed standing up from a chair using your arms (e.g., wheelchair or bedside chair)?: None Help needed to walk in hospital room?: None Help needed climbing 3-5 steps with a railing? : A Little 6 Click Score: 23    End of Session Equipment Utilized During Treatment: Oxygen Activity Tolerance: Patient tolerated treatment well Patient left: in bed;with call bell/phone within reach   PT Visit Diagnosis: Unsteadiness on feet (R26.81);Muscle weakness (generalized) (M62.81)     Time: 1410-1433 PT Time Calculation (min) (ACUTE ONLY): 23 min  Charges:  $Therapeutic Activity: 23-37 mins                     Aleda Grana, PT, DPT 01/12/21, 2:44 PM    Sandi Mariscal 01/12/2021, 2:41 PM

## 2021-01-12 NOTE — Progress Notes (Signed)
PROGRESS NOTE    Cody Alexander  YQM:[id number] DOB: May 16, 1956 DOA: 01/04/2021 PCP: Mckinley Jewel, FNP   Brief Narrative:  65 year old male with known COPD presents for COPD exacerbation and acute hypoxic respiratory failure.  Slow to improve.  Pulmonology consulted on 5/17.  Patient still with very rhonchorous breath sounds.  Slow to improve  Assessment & Plan:   Active Problems:   NSTEMI (non-ST elevated myocardial infarction) (HCC)   COPD exacerbation (HCC)   Essential hypertension   Acute on chronic respiratory failure with hypoxia (HCC)   Uncontrolled type 2 diabetes mellitus with hyperglycemia (HCC)   Acute on chronic diastolic CHF (congestive heart failure) (HCC)   OSA (obstructive sleep apnea)   Obesity (BMI 30-39.9)   Chronic respiratory failure with hypoxia (HCC)   Elevated troponin   Rash   Lower abdominal pain   Benign prostatic hyperplasia with urinary hesitancy  COPD exacerbation Acute on chronic hypoxic respiratory failure Patient starting to improve Pulmonology consulted, recommendations appreciated Still with rhonchorous breath sounds.  Slow to improve Plan: Continue prednisone 20 mg daily DuoNebs every 4 hours Brovana twice daily Pulmicort twice daily Mucomyst nebulizer As needed cough suppression Wean oxygen as tolerated, patient requiring 2 L  Acute on chronic diastolic congestive heart failure Volume status improved On p.o. Lasix On p.o. bisoprolol  Elevated troponin Suspect supply demand ischemia No plans for ischemic evaluation  Type 2 diabetes mellitus, uncontrolled with hyperlipidemia Basal bolus regimen Hemoglobin A1c 10.1, poor control  Rash on back Improved with oral Diflucan Also on ketoconazole Pramosone cream  Obesity This complicates overall care and prognosis     DVT prophylaxis: SQ Lovenox  code Status: Full Family Communication: None today Disposition Plan: Status is: Inpatient  Remains inpatient  appropriate because:Inpatient level of care appropriate due to severity of illness and Persistent symptoms   Dispo: The patient is from: Home              Anticipated d/c is to: Home              Patient currently is not medically stable to d/c.   Difficult to place patient No  Persistent respiratory symptoms.  Secondary to COPD flare.  Slow to improve.     Level of care: Progressive Cardiac  Consultants:   Pulmonary  Procedures:   None  Antimicrobials:  None   Subjective: Patient seen and examined.  Cough and shortness of breath slowly improved.  Stable over interval.  Objective: Vitals:   01/12/21 0814 01/12/21 0920 01/12/21 1106 01/12/21 1153  BP: 128/86   121/86  Pulse: 83   63  Resp: 18   20  Temp: (!) 97.5 F (36.4 C)   (!) 97.4 F (36.3 C)  TempSrc: Oral   Oral  SpO2: 98%  94% 92%  Weight:  132.9 kg    Height:        Intake/Output Summary (Last 24 hours) at 01/12/2021 1339 Last data filed at 01/12/2021 1156 Gross per 24 hour  Intake 1560 ml  Output 2975 ml  Net -1415 ml   Filed Weights   01/11/21 0408 01/11/21 0812 01/12/21 0920  Weight: 135.2 kg 133.2 kg 132.9 kg    Examination:  General exam: Appears calm and comfortable  Respiratory system: Severe coarse breath sounds bilaterally.  Scattered end expiratory wheeze.  2 L Cardiovascular system: S1 & S2 heard, RRR. No JVD, murmurs, rubs, gallops or clicks. No pedal edema. Gastrointestinal system: Abdomen is nondistended, soft and nontender. No organomegaly  or masses felt. Normal bowel sounds heard. Central nervous system: Alert and oriented. No focal neurological deficits. Extremities: Symmetric 5 x 5 power. Skin: No rashes, lesions or ulcers Psychiatry: Judgement and insight appear normal. Mood & affect appropriate.     Data Reviewed: I have personally reviewed following labs and imaging studies  CBC: Recent Labs  Lab 01/12/21 0457  WBC 9.2  HGB 14.2  HCT 44.1  MCV 92.1  PLT 236    Basic Metabolic Panel: Recent Labs  Lab 01/06/21 0450 01/07/21 0447 01/08/21 0353 01/09/21 0439 01/10/21 1028  NA 135 136 136 137 134*  K 4.2 4.4 4.6 4.9 4.4  CL 93* 92* 89* 92* 90*  CO2 35* 36* 38* 37* 35*  GLUCOSE 181* 196* 225* 203* 227*  BUN 27* 27* 26* 28* 26*  CREATININE 1.02 0.99 1.02 1.15 1.21  CALCIUM 8.5* 8.5* 8.9 8.7* 9.1   GFR: Estimated Creatinine Clearance: 88.2 mL/min (by C-G formula based on SCr of 1.21 mg/dL). Liver Function Tests: No results for input(s): AST, ALT, ALKPHOS, BILITOT, PROT, ALBUMIN in the last 168 hours. No results for input(s): LIPASE, AMYLASE in the last 168 hours. No results for input(s): AMMONIA in the last 168 hours. Coagulation Profile: No results for input(s): INR, PROTIME in the last 168 hours. Cardiac Enzymes: Recent Labs  Lab 01/06/21 0450  CKTOTAL 151   BNP (last 3 results) No results for input(s): PROBNP in the last 8760 hours. HbA1C: No results for input(s): HGBA1C in the last 72 hours. CBG: Recent Labs  Lab 01/11/21 1150 01/11/21 1649 01/11/21 2025 01/12/21 0816 01/12/21 1154  GLUCAP 186* 283* 275* 158* 83   Lipid Profile: No results for input(s): CHOL, HDL, LDLCALC, TRIG, CHOLHDL, LDLDIRECT in the last 72 hours. Thyroid Function Tests: No results for input(s): TSH, T4TOTAL, FREET4, T3FREE, THYROIDAB in the last 72 hours. Anemia Panel: No results for input(s): VITAMINB12, FOLATE, FERRITIN, TIBC, IRON, RETICCTPCT in the last 72 hours. Sepsis Labs: No results for input(s): PROCALCITON, LATICACIDVEN in the last 168 hours.  Recent Results (from the past 240 hour(s))  Resp Panel by RT-PCR (Flu A&B, Covid) Nasopharyngeal Swab     Status: None   Collection Time: 01/04/21  2:21 AM   Specimen: Nasopharyngeal Swab; Nasopharyngeal(NP) swabs in vial transport medium  Result Value Ref Range Status   SARS Coronavirus 2 by RT PCR NEGATIVE NEGATIVE Final    Comment: (NOTE) SARS-CoV-2 target nucleic acids are NOT  DETECTED.  The SARS-CoV-2 RNA is generally detectable in upper respiratory specimens during the acute phase of infection. The lowest concentration of SARS-CoV-2 viral copies this assay can detect is 138 copies/mL. A negative result does not preclude SARS-Cov-2 infection and should not be used as the sole basis for treatment or other patient management decisions. A negative result may occur with  improper specimen collection/handling, submission of specimen other than nasopharyngeal swab, presence of viral mutation(s) within the areas targeted by this assay, and inadequate number of viral copies(<138 copies/mL). A negative result must be combined with clinical observations, patient history, and epidemiological information. The expected result is Negative.  Fact Sheet for Patients:  BloggerCourse.com  Fact Sheet for Healthcare Providers:  SeriousBroker.it  This test is no t yet approved or cleared by the [place] FDA and  has been authorized for detection and/or diagnosis of SARS-CoV-2 by FDA under an Emergency Use Authorization (EUA). This EUA will remain  in effect (meaning this test can be used) for the duration of the COVID-19 declaration under  Section 564(b)(1) of the Act, 21 U.S.C.section 360bbb-3(b)(1), unless the authorization is terminated  or revoked sooner.       Influenza A by PCR NEGATIVE NEGATIVE Final   Influenza B by PCR NEGATIVE NEGATIVE Final    Comment: (NOTE) The Xpert Xpress SARS-CoV-2/FLU/RSV plus assay is intended as an aid in the diagnosis of influenza from Nasopharyngeal swab specimens and should not be used as a sole basis for treatment. Nasal washings and aspirates are unacceptable for Xpert Xpress SARS-CoV-2/FLU/RSV testing.  Fact Sheet for Patients: BloggerCourse.com  Fact Sheet for Healthcare Providers: SeriousBroker.it  This test is not yet  approved or cleared by the [place] FDA and has been authorized for detection and/or diagnosis of SARS-CoV-2 by FDA under an Emergency Use Authorization (EUA). This EUA will remain in effect (meaning this test can be used) for the duration of the COVID-19 declaration under Section 564(b)(1) of the Act, 21 U.S.C. section 360bbb-3(b)(1), unless the authorization is terminated or revoked.  Performed at [facility], [address], [place], [place] [address]          Radiology Studies: No results found.      Scheduled Meds: . amLODipine  5 mg Oral Daily  . arformoterol  15 mcg Nebulization BID  . aspirin EC  81 mg Oral Daily  . atorvastatin  40 mg Oral Daily  . benzonatate  200 mg Oral TID  . bisoprolol  2.5 mg Oral Daily  . chlorpheniramine-HYDROcodone  5 mL Oral Q8H  . dextromethorphan-guaiFENesin  2 tablet Oral BID  . enoxaparin (LOVENOX) injection  0.5 mg/kg Subcutaneous Q24H  . furosemide  40 mg Oral BID  . insulin aspart  0-20 Units Subcutaneous TID WC  . insulin aspart  0-5 Units Subcutaneous QHS  . insulin aspart  10 Units Subcutaneous TID WC  . insulin glargine  20 Units Subcutaneous Daily  . ipratropium-albuterol  3 mL Nebulization Q4H  . ketoconazole   Topical BID  . ketorolac  15 mg Intravenous Q6H  . predniSONE  20 mg Oral Q breakfast  . spironolactone  12.5 mg Oral Daily  . tamsulosin  0.4 mg Oral Daily  . triamcinolone cream   Topical TID   Continuous Infusions:   LOS: 8 days    Time spent: 15 minutes    Tresa Moore, MD [facility] Pager [phone]  If 7PM-7AM, please contact night-coverage 01/12/2021, 1:39 PM

## 2021-01-13 DIAGNOSIS — J441 Chronic obstructive pulmonary disease with (acute) exacerbation: Secondary | ICD-10-CM | POA: Diagnosis not present

## 2021-01-13 DIAGNOSIS — I5033 Acute on chronic diastolic (congestive) heart failure: Secondary | ICD-10-CM | POA: Diagnosis not present

## 2021-01-13 DIAGNOSIS — J9621 Acute and chronic respiratory failure with hypoxia: Secondary | ICD-10-CM | POA: Diagnosis not present

## 2021-01-13 LAB — BASIC METABOLIC PANEL
Anion gap: 8 (ref 5–15)
BUN: 37 mg/dL — ABNORMAL HIGH (ref 8–23)
CO2: 32 mmol/L (ref 22–32)
Calcium: 8.6 mg/dL — ABNORMAL LOW (ref 8.9–10.3)
Chloride: 94 mmol/L — ABNORMAL LOW (ref 98–111)
Creatinine, Ser: 1.46 mg/dL — ABNORMAL HIGH (ref 0.61–1.24)
GFR, Estimated: 53 mL/min — ABNORMAL LOW (ref >60)
Glucose, Bld: 276 mg/dL — ABNORMAL HIGH (ref 70–99)
Potassium: 4.3 mmol/L (ref 3.5–5.1)
Sodium: 134 mmol/L — ABNORMAL LOW (ref 135–145)

## 2021-01-13 LAB — GLUCOSE, CAPILLARY
Glucose-Capillary: 208 mg/dL — ABNORMAL HIGH (ref 70–99)
Glucose-Capillary: 55 mg/dL — ABNORMAL LOW (ref 70–99)
Glucose-Capillary: 75 mg/dL (ref 70–99)
Glucose-Capillary: 304 mg/dL — ABNORMAL HIGH (ref 70–99)
Glucose-Capillary: 269 mg/dL — ABNORMAL HIGH (ref 70–99)

## 2021-01-13 MED ORDER — INSULIN ASPART 100 UNIT/ML IJ SOLN
0.0000 [IU] | Freq: Three times a day (TID) | INTRAMUSCULAR | Status: DC
  Administered 2021-01-13: 11 [IU] via SUBCUTANEOUS
  Administered 2021-01-14: 8 [IU] via SUBCUTANEOUS
  Administered 2021-01-14: 2 [IU] via SUBCUTANEOUS
  Administered 2021-01-14: 8 [IU] via SUBCUTANEOUS
  Administered 2021-01-15: 5 [IU] via SUBCUTANEOUS
  Administered 2021-01-15: 3 [IU] via SUBCUTANEOUS
  Filled 2021-01-13 (×6): qty 1

## 2021-01-13 NOTE — Progress Notes (Signed)
Made Dr. Georgeann Oppenheim aware patient bun 37 and creat 1.46 this am. Per md hold po lasix and encourage po fluids. Will continue to monitor

## 2021-01-13 NOTE — Progress Notes (Signed)
Hypoglycemic Event  CBG: 55  Treatment: 8 oz juice  Symptoms: Sweaty  Follow-up CBG: Time:1207 CBG Result:75  Possible Reasons for Event: Medication regimen: steroids had been decreased by md  Comments/MD notified: made dr. Georgeann Oppenheim aware of blood sugar drop. Currently resistance scale with additional 10 units novolog with meals. Per md may be due to decrease in steroids, will stop scheduled 10 units novolog and change sliding scale. Will continue to monitor     Cody Alexander Cody Alexander

## 2021-01-13 NOTE — Progress Notes (Signed)
PROGRESS NOTE    Cody Alexander  MOQ:[id number] DOB: 23-Jul-1956 DOA: 01/04/2021 PCP: Mckinley Jewel, FNP   Brief Narrative:  66 year old male with known COPD presents for COPD exacerbation and acute hypoxic respiratory failure.  Slow to improve.  Pulmonology consulted on 5/17.  Patient still with very rhonchorous breath sounds.  Slow to improve  Assessment & Plan:   Active Problems:   NSTEMI (non-ST elevated myocardial infarction) (HCC)   COPD exacerbation (HCC)   Essential hypertension   Acute on chronic respiratory failure with hypoxia (HCC)   Uncontrolled type 2 diabetes mellitus with hyperglycemia (HCC)   Acute on chronic diastolic CHF (congestive heart failure) (HCC)   OSA (obstructive sleep apnea)   Obesity (BMI 30-39.9)   Chronic respiratory failure with hypoxia (HCC)   Elevated troponin   Rash   Lower abdominal pain   Benign prostatic hyperplasia with urinary hesitancy  COPD exacerbation Acute on chronic hypoxic respiratory failure Patient starting to improve Pulmonology consulted, recommendations appreciated Still with rhonchorous breath sounds.  Slow to improve Plan: Continue prednisone 20 mg daily DuoNebs every 4 hours Brovana twice daily Pulmicort twice daily Mucomyst nebulizer As needed cough suppression Wean oxygen as tolerated.  Patient requires between 2 and 3 L  Acute on chronic diastolic congestive heart failure Volume status improved On p.o. Lasix On p.o. bisoprolol  Elevated troponin Suspect supply demand ischemia No plans for ischemic evaluation  Type 2 diabetes mellitus, uncontrolled with hyperlipidemia Basal bolus regimen Hemoglobin A1c 10.1, poor control  Rash on back Improved with oral Diflucan Also on ketoconazole Pramosone cream  Obesity This complicates overall care and prognosis     DVT prophylaxis: SQ Lovenox  code Status: Full Family Communication: None today Disposition Plan: Status is: Inpatient  Remains  inpatient appropriate because:Inpatient level of care appropriate due to severity of illness and Persistent symptoms   Dispo: The patient is from: Home              Anticipated d/c is to: Home              Patient currently is not medically stable to d/c.   Difficult to place patient No  Persistent respiratory symptoms.  Secondary to COPD flare.  Slow to improve.     Level of care: Progressive Cardiac  Consultants:   Pulmonary  Procedures:   None  Antimicrobials:  None   Subjective: Patient seen and examined.  Cough and shortness of breath slow to improve.  Respiratory status stable over interval.  Objective: Vitals:   01/13/21 0735 01/13/21 1010 01/13/21 1107 01/13/21 1147  BP: 121/83   106/71  Pulse: 62   66  Resp: 17   20  Temp: (!) 97.5 F (36.4 C)   97.7 F (36.5 C)  TempSrc:    Oral  SpO2: 99%  95% 94%  Weight:  133.9 kg    Height:        Intake/Output Summary (Last 24 hours) at 01/13/2021 1312 Last data filed at 01/13/2021 1219 Gross per 24 hour  Intake 1200 ml  Output 950 ml  Net 250 ml   Filed Weights   01/12/21 0920 01/13/21 0346 01/13/21 1010  Weight: 132.9 kg 133.3 kg 133.9 kg    Examination:  General exam: Appears calm and comfortable  Respiratory system: Severe coarse breath sounds bilaterally.  Scattered end expiratory wheeze.  2 L Cardiovascular system: S1 & S2 heard, RRR. No JVD, murmurs, rubs, gallops or clicks. No pedal edema. Gastrointestinal system: Abdomen is  nondistended, soft and nontender. No organomegaly or masses felt. Normal bowel sounds heard. Central nervous system: Alert and oriented. No focal neurological deficits. Extremities: Symmetric 5 x 5 power. Skin: No rashes, lesions or ulcers Psychiatry: Judgement and insight appear normal. Mood & affect appropriate.     Data Reviewed: I have personally reviewed following labs and imaging studies  CBC: Recent Labs  Lab 01/12/21 0457  WBC 9.2  HGB 14.2  HCT 44.1  MCV  92.1  PLT 236   Basic Metabolic Panel: Recent Labs  Lab 01/07/21 0447 01/08/21 0353 01/09/21 0439 01/10/21 1028 01/13/21 0516  NA 136 136 137 134* 134*  K 4.4 4.6 4.9 4.4 4.3  CL 92* 89* 92* 90* 94*  CO2 36* 38* 37* 35* 32  GLUCOSE 196* 225* 203* 227* 276*  BUN 27* 26* 28* 26* 37*  CREATININE 0.99 1.02 1.15 1.21 1.46*  CALCIUM 8.5* 8.9 8.7* 9.1 8.6*   GFR: Estimated Creatinine Clearance: 73.4 mL/min (A) (by C-G formula based on SCr of 1.46 mg/dL (H)). Liver Function Tests: No results for input(s): AST, ALT, ALKPHOS, BILITOT, PROT, ALBUMIN in the last 168 hours. No results for input(s): LIPASE, AMYLASE in the last 168 hours. No results for input(s): AMMONIA in the last 168 hours. Coagulation Profile: No results for input(s): INR, PROTIME in the last 168 hours. Cardiac Enzymes: No results for input(s): CKTOTAL, CKMB, CKMBINDEX, TROPONINI in the last 168 hours. BNP (last 3 results) No results for input(s): PROBNP in the last 8760 hours. HbA1C: No results for input(s): HGBA1C in the last 72 hours. CBG: Recent Labs  Lab 01/12/21 1652 01/12/21 2016 01/13/21 0738 01/13/21 1148 01/13/21 1207  GLUCAP 332* 211* 208* 55* 75   Lipid Profile: No results for input(s): CHOL, HDL, LDLCALC, TRIG, CHOLHDL, LDLDIRECT in the last 72 hours. Thyroid Function Tests: No results for input(s): TSH, T4TOTAL, FREET4, T3FREE, THYROIDAB in the last 72 hours. Anemia Panel: No results for input(s): VITAMINB12, FOLATE, FERRITIN, TIBC, IRON, RETICCTPCT in the last 72 hours. Sepsis Labs: No results for input(s): PROCALCITON, LATICACIDVEN in the last 168 hours.  Recent Results (from the past 240 hour(s))  Resp Panel by RT-PCR (Flu A&B, Covid) Nasopharyngeal Swab     Status: None   Collection Time: 01/04/21  2:21 AM   Specimen: Nasopharyngeal Swab; Nasopharyngeal(NP) swabs in vial transport medium  Result Value Ref Range Status   SARS Coronavirus 2 by RT PCR NEGATIVE NEGATIVE Final    Comment:  (NOTE) SARS-CoV-2 target nucleic acids are NOT DETECTED.  The SARS-CoV-2 RNA is generally detectable in upper respiratory specimens during the acute phase of infection. The lowest concentration of SARS-CoV-2 viral copies this assay can detect is 138 copies/mL. A negative result does not preclude SARS-Cov-2 infection and should not be used as the sole basis for treatment or other patient management decisions. A negative result may occur with  improper specimen collection/handling, submission of specimen other than nasopharyngeal swab, presence of viral mutation(s) within the areas targeted by this assay, and inadequate number of viral copies(<138 copies/mL). A negative result must be combined with clinical observations, patient history, and epidemiological information. The expected result is Negative.  Fact Sheet for Patients:  BloggerCourse.com  Fact Sheet for Healthcare Providers:  SeriousBroker.it  This test is no t yet approved or cleared by the [place] FDA and  has been authorized for detection and/or diagnosis of SARS-CoV-2 by FDA under an Emergency Use Authorization (EUA). This EUA will remain  in effect (meaning this test can  be used) for the duration of the COVID-19 declaration under Section 564(b)(1) of the Act, 21 U.S.C.section 360bbb-3(b)(1), unless the authorization is terminated  or revoked sooner.       Influenza A by PCR NEGATIVE NEGATIVE Final   Influenza B by PCR NEGATIVE NEGATIVE Final    Comment: (NOTE) The Xpert Xpress SARS-CoV-2/FLU/RSV plus assay is intended as an aid in the diagnosis of influenza from Nasopharyngeal swab specimens and should not be used as a sole basis for treatment. Nasal washings and aspirates are unacceptable for Xpert Xpress SARS-CoV-2/FLU/RSV testing.  Fact Sheet for Patients: BloggerCourse.com  Fact Sheet for Healthcare  Providers: SeriousBroker.it  This test is not yet approved or cleared by the [place] FDA and has been authorized for detection and/or diagnosis of SARS-CoV-2 by FDA under an Emergency Use Authorization (EUA). This EUA will remain in effect (meaning this test can be used) for the duration of the COVID-19 declaration under Section 564(b)(1) of the Act, 21 U.S.C. section 360bbb-3(b)(1), unless the authorization is terminated or revoked.  Performed at [facility], [address] [place], [place] [address]          Radiology Studies: No results found.      Scheduled Meds: . amLODipine  5 mg Oral Daily  . arformoterol  15 mcg Nebulization BID  . aspirin EC  81 mg Oral Daily  . atorvastatin  40 mg Oral Daily  . benzonatate  200 mg Oral TID  . bisoprolol  2.5 mg Oral Daily  . chlorpheniramine-HYDROcodone  5 mL Oral Q8H  . dextromethorphan-guaiFENesin  2 tablet Oral BID  . enoxaparin (LOVENOX) injection  0.5 mg/kg Subcutaneous Q24H  . furosemide  40 mg Oral BID  . insulin aspart  0-15 Units Subcutaneous TID WC  . insulin aspart  0-5 Units Subcutaneous QHS  . insulin glargine  20 Units Subcutaneous Daily  . ipratropium-albuterol  3 mL Nebulization Q4H  . ketoconazole   Topical BID  . predniSONE  20 mg Oral Q breakfast  . spironolactone  12.5 mg Oral Daily  . tamsulosin  0.4 mg Oral Daily  . triamcinolone cream   Topical TID   Continuous Infusions:   LOS: 9 days    Time spent: 15 minutes    Tresa Moore, MD [facility] Pager [phone]  If 7PM-7AM, please contact night-coverage 01/13/2021, 1:12 PM

## 2021-01-13 NOTE — Plan of Care (Signed)
  Problem: Education: Goal: Ability to verbalize understanding of medication therapies will improve Outcome: Progressing   Problem: Activity: Goal: Capacity to carry out activities will improve Outcome: Progressing   Problem: Education: Goal: Ability to demonstrate management of disease process will improve Outcome: Completed/Met

## 2021-01-14 DIAGNOSIS — I5033 Acute on chronic diastolic (congestive) heart failure: Secondary | ICD-10-CM | POA: Diagnosis not present

## 2021-01-14 DIAGNOSIS — J9621 Acute and chronic respiratory failure with hypoxia: Secondary | ICD-10-CM | POA: Diagnosis not present

## 2021-01-14 DIAGNOSIS — J441 Chronic obstructive pulmonary disease with (acute) exacerbation: Secondary | ICD-10-CM | POA: Diagnosis not present

## 2021-01-14 LAB — GLUCOSE, CAPILLARY
Glucose-Capillary: 134 mg/dL — ABNORMAL HIGH (ref 70–99)
Glucose-Capillary: 275 mg/dL — ABNORMAL HIGH (ref 70–99)
Glucose-Capillary: 293 mg/dL — ABNORMAL HIGH (ref 70–99)
Glucose-Capillary: 252 mg/dL — ABNORMAL HIGH (ref 70–99)

## 2021-01-14 MED ORDER — IPRATROPIUM-ALBUTEROL 0.5-2.5 (3) MG/3ML IN SOLN
3.0000 mL | Freq: Four times a day (QID) | RESPIRATORY_TRACT | Status: DC
  Administered 2021-01-14 – 2021-01-15 (×5): 3 mL via RESPIRATORY_TRACT
  Filled 2021-01-14 (×4): qty 3
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
## 2021-01-14 NOTE — Plan of Care (Signed)
?  Problem: Education: ?Goal: Ability to verbalize understanding of medication therapies will improve ?Outcome: Progressing ?  ?Problem: Activity: ?Goal: Capacity to carry out activities will improve ?Outcome: Progressing ?  ?Problem: Cardiac: ?Goal: Ability to achieve and maintain adequate cardiopulmonary perfusion will improve ?Outcome: Progressing ?  ?

## 2021-01-14 NOTE — Progress Notes (Signed)
PROGRESS NOTE    Cody Alexander  TDH:[id number] DOB: July 14, 1956 DOA: 01/04/2021 PCP: Mckinley Jewel, FNP   Brief Narrative:  65 year old male with known COPD presents for COPD exacerbation and acute hypoxic respiratory failure.  Slow to improve.  Pulmonology consulted on 5/17.  Patient still with very rhonchorous breath sounds.  Slow to improve.  5/22: Seems to be turning the corner.  Less rhonchorous  Assessment & Plan:   Active Problems:   NSTEMI (non-ST elevated myocardial infarction) (HCC)   COPD exacerbation (HCC)   Essential hypertension   Acute on chronic respiratory failure with hypoxia (HCC)   Uncontrolled type 2 diabetes mellitus with hyperglycemia (HCC)   Acute on chronic diastolic CHF (congestive heart failure) (HCC)   OSA (obstructive sleep apnea)   Obesity (BMI 30-39.9)   Chronic respiratory failure with hypoxia (HCC)   Elevated troponin   Rash   Lower abdominal pain   Benign prostatic hyperplasia with urinary hesitancy  COPD exacerbation Acute on chronic hypoxic respiratory failure Patient starting to improve Pulmonology consulted, recommendations appreciated Still with rhonchorous breath sounds.  Slow to improve Plan: Continue prednisone 20 mg daily Scheduled nebs Brovana twice daily Pulmicort twice daily Mucomyst nebulizer As needed cough suppression Wean oxygen as tolerated.  Patient requires between 2 and 3 L Anticipate need for home oxygen Will perform ambulatory desat test on day of perspective discharge  Acute on chronic diastolic congestive heart failure Volume status improved On p.o. Lasix On p.o. bisoprolol  Elevated troponin Suspect supply demand ischemia No plans for ischemic evaluation  Type 2 diabetes mellitus, uncontrolled with hyperlipidemia Basal bolus regimen Hemoglobin A1c 10.1, poor control  Rash on back Improved with oral Diflucan Also on ketoconazole Pramosone cream  Obesity This complicates overall care and  prognosis     DVT prophylaxis: SQ Lovenox  code Status: Full Family Communication: None today Disposition Plan: Status is: Inpatient  Remains inpatient appropriate because:Inpatient level of care appropriate due to severity of illness and Persistent symptoms   Dispo: The patient is from: Home              Anticipated d/c is to: Home              Patient currently is not medically stable to d/c.   Difficult to place patient No  Persistent respiratory symptoms.  Secondary to COPD flare.  Slow to improve.     Level of care: Progressive Cardiac  Consultants:   Pulmonary  Procedures:   None  Antimicrobials:  None   Subjective: Patient seen and examined.  Cough and shortness of breath starting to improve.  Respiratory status stable over interval.  Objective: Vitals:   01/14/21 0343 01/14/21 0744 01/14/21 0802 01/14/21 1145  BP: 121/88 (!) 141/88  114/78  Pulse:  68  72  Resp:  18  18  Temp: 97.8 F (36.6 C) 97.7 F (36.5 C)  97.9 F (36.6 C)  TempSrc: Oral     SpO2: 100% 100% 99% 97%  Weight: 134.2 kg     Height:        Intake/Output Summary (Last 24 hours) at 01/14/2021 1322 Last data filed at 01/14/2021 0940 Gross per 24 hour  Intake 1200 ml  Output 700 ml  Net 500 ml   Filed Weights   01/13/21 0346 01/13/21 1010 01/14/21 0343  Weight: 133.3 kg 133.9 kg 134.2 kg    Examination:  General exam: Appears calm and comfortable  Respiratory system: Severe coarse breath sounds bilaterally.  Scattered  end expiratory wheeze.  2 L Cardiovascular system: S1 & S2 heard, RRR. No JVD, murmurs, rubs, gallops or clicks. No pedal edema. Gastrointestinal system: Abdomen is nondistended, soft and nontender. No organomegaly or masses felt. Normal bowel sounds heard. Central nervous system: Alert and oriented. No focal neurological deficits. Extremities: Symmetric 5 x 5 power. Skin: No rashes, lesions or ulcers Psychiatry: Judgement and insight appear normal. Mood &  affect appropriate.     Data Reviewed: I have personally reviewed following labs and imaging studies  CBC: Recent Labs  Lab 01/12/21 0457  WBC 9.2  HGB 14.2  HCT 44.1  MCV 92.1  PLT 236   Basic Metabolic Panel: Recent Labs  Lab 01/08/21 0353 01/09/21 0439 01/10/21 1028 01/13/21 0516  NA 136 137 134* 134*  K 4.6 4.9 4.4 4.3  CL 89* 92* 90* 94*  CO2 38* 37* 35* 32  GLUCOSE 225* 203* 227* 276*  BUN 26* 28* 26* 37*  CREATININE 1.02 1.15 1.21 1.46*  CALCIUM 8.9 8.7* 9.1 8.6*   GFR: Estimated Creatinine Clearance: 73.5 mL/min (A) (by C-G formula based on SCr of 1.46 mg/dL (H)). Liver Function Tests: No results for input(s): AST, ALT, ALKPHOS, BILITOT, PROT, ALBUMIN in the last 168 hours. No results for input(s): LIPASE, AMYLASE in the last 168 hours. No results for input(s): AMMONIA in the last 168 hours. Coagulation Profile: No results for input(s): INR, PROTIME in the last 168 hours. Cardiac Enzymes: No results for input(s): CKTOTAL, CKMB, CKMBINDEX, TROPONINI in the last 168 hours. BNP (last 3 results) No results for input(s): PROBNP in the last 8760 hours. HbA1C: No results for input(s): HGBA1C in the last 72 hours. CBG: Recent Labs  Lab 01/13/21 1207 01/13/21 1532 01/13/21 2044 01/14/21 0739 01/14/21 1146  GLUCAP 75 304* 269* 134* 275*   Lipid Profile: No results for input(s): CHOL, HDL, LDLCALC, TRIG, CHOLHDL, LDLDIRECT in the last 72 hours. Thyroid Function Tests: No results for input(s): TSH, T4TOTAL, FREET4, T3FREE, THYROIDAB in the last 72 hours. Anemia Panel: No results for input(s): VITAMINB12, FOLATE, FERRITIN, TIBC, IRON, RETICCTPCT in the last 72 hours. Sepsis Labs: No results for input(s): PROCALCITON, LATICACIDVEN in the last 168 hours.  No results found for this or any previous visit (from the past 240 hour(s)).       Radiology Studies: No results found.      Scheduled Meds: . amLODipine  5 mg Oral Daily  . arformoterol  15  mcg Nebulization BID  . aspirin EC  81 mg Oral Daily  . atorvastatin  40 mg Oral Daily  . benzonatate  200 mg Oral TID  . bisoprolol  2.5 mg Oral Daily  . chlorpheniramine-HYDROcodone  5 mL Oral Q8H  . dextromethorphan-guaiFENesin  2 tablet Oral BID  . enoxaparin (LOVENOX) injection  0.5 mg/kg Subcutaneous Q24H  . furosemide  40 mg Oral BID  . insulin aspart  0-15 Units Subcutaneous TID WC  . insulin aspart  0-5 Units Subcutaneous QHS  . insulin glargine  20 Units Subcutaneous Daily  . ipratropium-albuterol  3 mL Nebulization Q6H  . ketoconazole   Topical BID  . predniSONE  20 mg Oral Q breakfast  . spironolactone  12.5 mg Oral Daily  . tamsulosin  0.4 mg Oral Daily  . triamcinolone cream   Topical TID   Continuous Infusions:   LOS: 10 days    Time spent: 15 minutes    Tresa Moore, MD [facility] Pager [phone]  If 7PM-7AM, please contact  night-coverage 01/14/2021, 1:22 PM

## 2021-01-15 DIAGNOSIS — J9621 Acute and chronic respiratory failure with hypoxia: Secondary | ICD-10-CM | POA: Diagnosis not present

## 2021-01-15 DIAGNOSIS — J441 Chronic obstructive pulmonary disease with (acute) exacerbation: Secondary | ICD-10-CM | POA: Diagnosis not present

## 2021-01-15 LAB — GLUCOSE, CAPILLARY
Glucose-Capillary: 187 mg/dL — ABNORMAL HIGH (ref 70–99)
Glucose-Capillary: 223 mg/dL — ABNORMAL HIGH (ref 70–99)

## 2021-01-15 MED ORDER — HYDROCOD POLST-CPM POLST ER 10-8 MG/5ML PO SUER: 5.0000 mL | Freq: Three times a day (TID) | ORAL | 0 refills | Status: DC

## 2021-01-15 MED ORDER — ALBUTEROL SULFATE (2.5 MG/3ML) 0.083% IN NEBU: 2.5000 mg | INHALATION_SOLUTION | RESPIRATORY_TRACT | 2 refills | Status: DC | PRN

## 2021-01-15 MED ORDER — PREDNISONE 20 MG PO TABS: 20.0000 mg | ORAL_TABLET | Freq: Every day | ORAL | 0 refills | Status: AC

## 2021-01-15 MED ORDER — SPIRONOLACTONE 25 MG PO TABS: 12.5000 mg | ORAL_TABLET | Freq: Every day | ORAL | 0 refills | Status: DC

## 2021-01-15 NOTE — Progress Notes (Signed)
Discharge instructions reviewed with patient utilizing teach back method no question at this time. Patient being discharged to home.
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
## 2021-01-15 NOTE — TOC Transition Note (Signed)
Transition of Care [facility]) - CM/SW Discharge Note   Patient Details  Name: Cody Alexander MRN: [id number] Date of Birth: [birth year]  [facility] CM/SW Contact:  Gildardo Griffes, LCSW Phone Number: 01/15/2021, 10:10 AM   Clinical Narrative:     CSW notes patient to be discharged today home. CSW has made care team aware that despite many attempts, no home health agency has accepted patient due to insurance barriers.   CSW made aware patient need of nebulizer for discharge, CSW spoke with Bjorn Loser with [employer] who will bring Nebulizer to patient's room around 12:30 pm.   No further discharge needs identified at this time.   Final next level of care: Home/Self Care Barriers to Discharge: No Barriers Identified   Patient Goals and CMS Choice Patient states their goals for this hospitalization and ongoing recovery are:: to go home CMS Medicare.gov Compare Post Acute Care list provided to:: Patient Choice offered to / list presented to : Patient  Discharge Placement                       Discharge Plan and Services In-house Referral: Clinical Social Work   Post Acute Care Choice: Home Health          DME Arranged:  (nebulizer) DME Agency: [employer] Date DME Agency Contacted: 01/15/21 Time DME Agency Contacted: 1010 Representative spoke with at DME Agency: Juliette Alcide Agency: NA (Pending.)        Social Determinants of Health (SDOH) Interventions     Readmission Risk Interventions No flowsheet data found.

## 2021-01-15 NOTE — Discharge Summary (Signed)
Physician Discharge Summary  Cody Alexander UYQ:[id number] DOB: 03-04-1956 DOA: 01/04/2021  PCP: Mckinley Jewel, FNP  Admit date: 01/04/2021 Discharge date: 01/15/2021  Admitted From: Home Disposition: Home with home health  Recommendations for Outpatient Follow-up:  1. Follow up with PCP in 1-2 weeks 2. Follow-up with pulmonology Dr. Karna Christmas  Home Health: Yes Equipment/Devices: Oxygen 2 L Discharge Condition: Stable CODE STATUS: Full Diet recommendation: Heart healthy Brief/Interim Summary: 65 year old male with known COPD presents for COPD exacerbation and acute hypoxic respiratory failure.  Slow to improve.  Pulmonology consulted on 5/17.  Patient still with very rhonchorous breath sounds.  Slow to improve.  5/22: Seems to be turning the corner.  Less rhonchorous 5/23: Extended hospital stay.  Respiratory status at or near baseline.  Patient was able to expel some mucus that had caused him respiratory distress for days.  Feels much better this morning.  Ambulating freely in the hallway.  Stable for discharge home.  Discharge Diagnoses:  Active Problems:   NSTEMI (non-ST elevated myocardial infarction) (HCC)   COPD exacerbation (HCC)   Essential hypertension   Acute on chronic respiratory failure with hypoxia (HCC)   Uncontrolled type 2 diabetes mellitus with hyperglycemia (HCC)   Acute on chronic diastolic CHF (congestive heart failure) (HCC)   OSA (obstructive sleep apnea)   Obesity (BMI 30-39.9)   Chronic respiratory failure with hypoxia (HCC)   Elevated troponin   Rash   Lower abdominal pain   Benign prostatic hyperplasia with urinary hesitancy  COPD exacerbation Acute on chronic hypoxic respiratory failure Patient starting to improve Pulmonology consulted, recommendations appreciated Lung function took quite a while to improve Aggressive nebulizer and steroid regimen Approaching baseline at time of discharge Considering extended hospital stay will  recommend prednisone 20 mg a day x14 days following discharge.  Resume home inhaler and nebulizer regimen.  Outpatient follow-up with pulmonology arranged at time of discharge. Back on home rate of oxygen  Acute on chronic diastolic congestive heart failure Volume status improved Can resume home regimen  Elevated troponin Suspect supply demand ischemia No plans for ischemic evaluation  Type 2 diabetes mellitus, uncontrolled with hyperlipidemia Basal bolus regimen Hemoglobin A1c 10.1, poor control Outpatient PCP follow-up  Rash on back Improved with oral Diflucan Also on ketoconazole Pramosone cream  Obesity This complicates overall care and prognosis  Discharge Instructions  Discharge Instructions    Diet - low sodium heart healthy   Complete by: As directed    Increase activity slowly   Complete by: As directed      Allergies as of 01/15/2021      Reactions   Erythromycin Anaphylaxis, Swelling   Had eye swelling with erythromycin during a time when he had perf ear drum. Tolerated azithromycin.      Medication List    STOP taking these medications   cefdinir 300 MG capsule Commonly known as: OMNICEF   lisinopril 20 MG tablet Commonly known as: ZESTRIL     TAKE these medications   acetaminophen 500 MG tablet Commonly known as: TYLENOL Take 1,000 mg by mouth every 6 (six) hours as needed. Notes to patient: 01/15/2021 @ 3:10 pm    amLODipine 10 MG tablet Commonly known as: NORVASC Take 1 tablet by mouth daily. Notes to patient: 01/16/2021 @ 10:00 am    aspirin EC 81 MG tablet Take 81 mg by mouth daily. Swallow whole. Notes to patient: 01/16/2021 @ 10:00 am   atorvastatin 40 MG tablet Commonly known as: LIPITOR Take 1 tablet by mouth  daily as needed. Notes to patient: 01/16/2021 @ 10:00 am    Atrovent HFA 17 MCG/ACT inhaler Generic drug: ipratropium Inhale 1 puff into the lungs every 6 (six) hours as needed. Notes to patient: 01/15/2021     chlorpheniramine-HYDROcodone 10-8 MG/5ML Suer Commonly known as: TUSSIONEX Take 5 mLs by mouth every 8 (eight) hours. Notes to patient: 01/15/2021 @ 4:00 pm    furosemide 80 MG tablet Commonly known as: LASIX Take 80 mg by mouth daily. Notes to patient: 01/16/2021 @ 8:00 am    ipratropium-albuterol 0.5-2.5 (3) MG/3ML Soln Commonly known as: DUONEB Inhale 3 mLs into the lungs every 4 (four) hours as needed. Notes to patient: 01/15/2021 @ 1230    isosorbide dinitrate 30 MG tablet Commonly known as: ISORDIL Take 30 mg by mouth every morning. Notes to patient: 01/16/2021 @ 10:00 am    ketoconazole 2 % cream Commonly known as: NIZORAL Apply 1 application topically 2 (two) times daily. Notes to patient: 01/15/2021 @ 10:00 pm    metFORMIN 1000 MG tablet Commonly known as: GLUCOPHAGE Take 1,000 mg by mouth 2 (two) times daily. Notes to patient: 01/15/2021 @ 5:00 pm    metoprolol tartrate 25 MG tablet Commonly known as: LOPRESSOR Take 25 mg by mouth 2 (two) times daily. Notes to patient: 01/15/2021 @ 10:00 pm    predniSONE 20 MG tablet Commonly known as: DELTASONE Take 1 tablet (20 mg total) by mouth daily with breakfast for 14 days. Start taking on: Jan 16, 2021 Notes to patient: 01/16/2021 @ 8:00 am    ProAir HFA 108 (90 Base) MCG/ACT inhaler Generic drug: albuterol Inhale 2 puffs into the lungs every 4 (four) hours as needed for wheezing. What changed: Another medication with the same name was added. Make sure you understand how and when to take each. Notes to patient: 01/15/2021   albuterol (2.5 MG/3ML) 0.083% nebulizer solution Commonly known as: PROVENTIL Take 3 mLs (2.5 mg total) by nebulization every 4 (four) hours as needed for wheezing or shortness of breath. What changed: You were already taking a medication with the same name, and this prescription was added. Make sure you understand how and when to take each. Notes to patient: 01/15/2021   spironolactone 25 MG  tablet Commonly known as: ALDACTONE Take 0.5 tablets (12.5 mg total) by mouth daily. Start taking on: Jan 16, 2021 Notes to patient: 01/16/2021 @ 10:00 am    Symbicort 160-4.5 MCG/ACT inhaler Generic drug: budesonide-formoterol Inhale 2 puffs into the lungs 2 (two) times daily. Notes to patient: 01/15/2021 @ 8:00 pm    triamcinolone 0.025 % cream Commonly known as: KENALOG Apply 1 application topically 2 (two) times daily. Notes to patient: 01/15/2021 @ 10 :00 pm             Durable Medical Equipment  (From admission, onward)         Start     Ordered   01/15/21 0948  For home use only DME Nebulizer/meds  Once       Question Answer Comment  Patient needs a nebulizer to treat with the following condition COPD (chronic obstructive pulmonary disease) (HCC)   Length of Need Lifetime      01/15/21 0948          Follow-up Information    Mckinley Jewel, FNP. Schedule an appointment as soon as possible for a visit in 1 week(s).   Specialty: Family Medicine Contact information: [facility] [address] [place] [address] [phone]  Vida Rigger, MD. Schedule an appointment as soon as possible for a visit in 1 week(s).   Specialty: Pulmonary Disease Contact information: [address] [place] [address] [phone]              Allergies  Allergen Reactions  . Erythromycin Anaphylaxis and Swelling    Had eye swelling with erythromycin during a time when he had perf ear drum.  Tolerated azithromycin.    Consultations:  Pulmonology   Procedures/Studies: DG Chest Port 1 View  Result Date: 01/05/2021 CLINICAL DATA:  Dyspnea. EXAM: PORTABLE CHEST 1 VIEW COMPARISON:  Radiograph yesterday. FINDINGS: Stable cardiomegaly. Unchanged vascular congestion. Aortic atherosclerosis. No new airspace disease. No pleural fluid or pneumothorax. Stable osseous structures. IMPRESSION: Unchanged cardiomegaly and vascular  congestion. Electronically Signed   By: Narda Rutherford M.D.   On: 01/05/2021 23:29   DG Chest Port 1 View  Result Date: 01/04/2021 CLINICAL DATA:  65 year old male with shortness of breath. EXAM: PORTABLE CHEST 1 VIEW COMPARISON:  Chest radiograph dated 10/18/2020. FINDINGS: Minimal bibasilar atelectasis. No focal consolidation, pleural effusion, or pneumothorax. Stable cardiomegaly with mild central vascular congestion. Atherosclerotic calcification of the aorta. No acute osseous pathology. IMPRESSION: Cardiomegaly with mild central vascular congestion. No focal consolidation. Electronically Signed   By: Elgie Collard M.D.   On: 01/04/2021 03:07   ECHOCARDIOGRAM COMPLETE  Result Date: 01/04/2021    ECHOCARDIOGRAM REPORT   Patient Name:   SAMIT SYLVE Date of Exam: 01/04/2021 Medical Rec #:  [id number]         Height:       73.0 in Accession #:    [id number]        Weight:       300.0 lb Date of Birth:  March 08, 1956         BSA:          2.556 m Patient Age:    64 years          BP:           137/78 mmHg Patient Gender: M                 HR:           97 bpm. Exam Location:  [facility] Procedure: 2D Echo, Cardiac Doppler and Color Doppler Indications:     CHF- acute diastolic I50.31  History:         Patient has prior history of Echocardiogram examinations, most                  recent 02/25/2018. CHF, COPD; Risk Factors:Hypertension.  Sonographer:     Cristela Blue RDCS (AE) Referring Phys:  [id number] Alford Highland Diagnosing Phys: Adrian Blackwater MD  Sonographer Comments: Technically difficult study due to poor echo windows, no apical window and no subcostal window. Image acquisition challenging due to COPD. IMPRESSIONS  1. Left ventricular ejection fraction, by estimation, is 60 to 65%. The left ventricle has normal function. The left ventricle has no regional wall motion abnormalities. There is moderate left ventricular hypertrophy. Left ventricular diastolic parameters are consistent with Grade I diastolic  dysfunction (impaired relaxation).  2. Right ventricular systolic function is normal. The right ventricular size is normal.  3. Left atrial size was mildly dilated.  4. Right atrial size was mildly dilated.  5. The mitral valve is normal in structure. No evidence of mitral valve regurgitation. No evidence of mitral stenosis.  6. The aortic valve is normal in structure. Aortic valve regurgitation  is not visualized. No aortic stenosis is present.  7. The inferior vena cava is normal in size with greater than 50% respiratory variability, suggesting right atrial pressure of 3 mmHg. FINDINGS  Left Ventricle: Left ventricular ejection fraction, by estimation, is 60 to 65%. The left ventricle has normal function. The left ventricle has no regional wall motion abnormalities. The left ventricular internal cavity size was normal in size. There is  moderate left ventricular hypertrophy. Left ventricular diastolic parameters are consistent with Grade I diastolic dysfunction (impaired relaxation). Right Ventricle: The right ventricular size is normal. No increase in right ventricular wall thickness. Right ventricular systolic function is normal. Left Atrium: Left atrial size was mildly dilated. Right Atrium: Right atrial size was mildly dilated. Pericardium: There is no evidence of pericardial effusion. Mitral Valve: The mitral valve is normal in structure. No evidence of mitral valve regurgitation. No evidence of mitral valve stenosis. Tricuspid Valve: The tricuspid valve is normal in structure. Tricuspid valve regurgitation is not demonstrated. No evidence of tricuspid stenosis. Aortic Valve: The aortic valve is normal in structure. Aortic valve regurgitation is not visualized. No aortic stenosis is present. Pulmonic Valve: The pulmonic valve was normal in structure. Pulmonic valve regurgitation is not visualized. No evidence of pulmonic stenosis. Aorta: The aortic root is normal in size and structure. Venous: The inferior  vena cava is normal in size with greater than 50% respiratory variability, suggesting right atrial pressure of 3 mmHg. IAS/Shunts: No atrial level shunt detected by color flow Doppler.  LEFT VENTRICLE PLAX 2D LVIDd:         4.92 cm LVIDs:         2.82 cm LV PW:         1.66 cm LV IVS:        2.13 cm LVOT diam:     2.30 cm LVOT Area:     4.15 cm  LEFT ATRIUM         Index LA diam:    3.40 cm 1.33 cm/m                        PULMONIC VALVE AORTA                 PV Vmax:        0.92 m/s Ao Root diam: 4.07 cm PV Peak grad:   3.4 mmHg                       RVOT Peak grad: 3 mmHg  TRICUSPID VALVE TR Peak grad:   13.2 mmHg TR Vmax:        182.00 cm/s  SHUNTS Systemic Diam: 2.30 cm Adrian Blackwater MD Electronically signed by Adrian Blackwater MD Signature Date/Time: 01/04/2021/2:01:24 PM    Final    CT RENAL STONE STUDY  Result Date: 01/07/2021 CLINICAL DATA:  UTI and hematuria. EXAM: CT ABDOMEN AND PELVIS WITHOUT CONTRAST TECHNIQUE: Multidetector CT imaging of the abdomen and pelvis was performed following the standard protocol without IV contrast. COMPARISON:  CT chest 12/28/2018 FINDINGS: Lower chest: No acute abnormality. Evaluation of the abdominal viscera limited by the lack of IV contrast. Hepatobiliary: There is a small hyperechoic subcapsular lesion in the posterior right hepatic lobe, stable. Normal appearance of the gallbladder. Pancreas: Unremarkable. No surrounding inflammatory changes. Spleen: Normal in size without focal abnormality. Adrenals/Urinary Tract: Normal right adrenal gland. 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 No renal  calculi or hydronephrosis. No mass lesion identified. Urinary bladder is unremarkable in appearance. Stomach/Bowel: Stomach is within normal limits. Appendix appears normal. No evidence of bowel wall thickening, distention, or inflammatory changes. Vascular/Lymphatic: Aortic atherosclerosis. No enlarged abdominal or pelvic lymph nodes. Reproductive: Prostate  is unremarkable. Other: Fat containing left inguinal hernia. Tiny fat containing umbilical hernia. Musculoskeletal: No acute findings. Multilevel degenerative disc disease in the thoracolumbar spine. IMPRESSION: 1. No acute finding in the abdomen or pelvis on a noncontrast exam. No renal calculi or hydronephrosis. 2. Fat containing left inguinal hernia. 3. Aortic atherosclerosis. Aortic Atherosclerosis (ICD10-I70.0).  In Electronically Signed   By: Emmaline Kluver M.D.   On: 01/07/2021 17:03    (Echo, Carotid, EGD, Colonoscopy, ERCP)    Subjective: Patient seen and examined on the day of discharge.  Stable, no distress.  Ambulating around hallway freely.  There is stable for discharge home.  Discharge Exam: Vitals:   01/15/21 0909 01/15/21 1210  BP: 100/69 117/79  Pulse: 78 75  Resp: 20 18  Temp:  98 F (36.7 C)  SpO2: 94% 95%   Vitals:   01/15/21 0723 01/15/21 0735 01/15/21 0909 01/15/21 1210  BP: 133/84  100/69 117/79  Pulse: 65  78 75  Resp: Temp: 97.8 F (36.6 C)   98 F (36.7 C)  TempSrc: Oral   Oral  SpO2: 99% 98% 94% 95%  Weight:      Height:        General: Pt is alert, awake, not in acute distress Cardiovascular: RRR, S1/S2 +, no rubs, no gallops Respiratory: Bilateral scattered crackles.  Normal work of breathing.  2 L Abdominal: Soft, NT, ND, bowel sounds + Extremities: no edema, no cyanosis    The results of significant diagnostics from this hospitalization (including imaging, microbiology, ancillary and laboratory) are listed below for reference.     Microbiology: No results found for this or any previous visit (from the past 240 hour(s)).   Labs: BNP (last 3 results) Recent Labs    06/20/20 1505 01/04/21 0220  BNP 136.0* 93.2   Basic Metabolic Panel: Recent Labs  Lab 01/09/21 0439 01/10/21 1028 01/13/21 0516  NA 137 134* 134*  K 4.9 4.4 4.3  CL 92* 90* 94*  CO2 37* 35* 32  GLUCOSE 203* 227* 276*  BUN 28* 26* 37*  CREATININE  1.15 1.21 1.46*  CALCIUM 8.7* 9.1 8.6*   Liver Function Tests: No results for input(s): AST, ALT, ALKPHOS, BILITOT, PROT, ALBUMIN in the last 168 hours. No results for input(s): LIPASE, AMYLASE in the last 168 hours. No results for input(s): AMMONIA in the last 168 hours. CBC: Recent Labs  Lab 01/12/21 0457  WBC 9.2  HGB 14.2  HCT 44.1  MCV 92.1  PLT 236   Cardiac Enzymes: No results for input(s): CKTOTAL, CKMB, CKMBINDEX, TROPONINI in the last 168 hours. BNP: Invalid input(s): POCBNP CBG: Recent Labs  Lab 01/14/21 1146 01/14/21 1612 01/14/21 2050 01/15/21 0724 01/15/21 1211  GLUCAP 275* 293* 252* 187* 223*   D-Dimer No results for input(s): DDIMER in the last 72 hours. Hgb A1c No results for input(s): HGBA1C in the last 72 hours. Lipid Profile No results for input(s): CHOL, HDL, LDLCALC, TRIG, CHOLHDL, LDLDIRECT in the last 72 hours. Thyroid function studies No results for input(s): TSH, T4TOTAL, T3FREE, THYROIDAB in the last 72 hours.  Invalid input(s): FREET3 Anemia work up No results for input(s): VITAMINB12, FOLATE, FERRITIN, TIBC, IRON, RETICCTPCT in the last 72  hours. Urinalysis    Component Value Date/Time   COLORURINE YELLOW (A) 01/07/2021 1612   APPEARANCEUR CLEAR (A) 01/07/2021 1612   APPEARANCEUR Clear 08/20/2013 0849   LABSPEC 1.012 01/07/2021 1612   LABSPEC 1.014 08/20/2013 0849   PHURINE 7.0 01/07/2021 1612   GLUCOSEU 50 (A) 01/07/2021 1612   GLUCOSEU Negative 08/20/2013 0849   HGBUR SMALL (A) 01/07/2021 1612   BILIRUBINUR NEGATIVE 01/07/2021 1612   BILIRUBINUR Negative 08/20/2013 0849   KETONESUR NEGATIVE 01/07/2021 1612   PROTEINUR NEGATIVE 01/07/2021 1612   NITRITE NEGATIVE 01/07/2021 1612   LEUKOCYTESUR NEGATIVE 01/07/2021 1612   LEUKOCYTESUR Negative 08/20/2013 0849   Sepsis Labs Invalid input(s): PROCALCITONIN,  WBC,  LACTICIDVEN Microbiology No results found for this or any previous visit (from the past 240 hour(s)).   Time  coordinating discharge: Over 30 minutes  SIGNED:   Tresa MooreSudheer B Mishal Probert, MD  [facility] 01/15/2021, 2:41 PM Pager   If 7PM-7AM, please contact night-coverage

## 2021-01-24 NOTE — Progress Notes (Deleted)
   Patient ID: Cody Alexander, male    DOB: [birth year], 65 y.o.   MRN: [id number]  HPI  Cody Alexander is a 65 y/o male with a history of  Echo report from 01/04/21 reviewed and showed an EF of 60-65% along with moderate LVH and mild LAE  Cath report from 02/27/18 showed:   Prox LAD lesion is 35% stenosed.  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 Noncardiac chest pain.  Admitted 01/04/21 due to COPD exacerbation. Aggressive nebulizer and steroids given. Cardiology and pulmonology consults obtained. Elevated troponin thought to be due to demand ischemia. Initially given IV lasix with transition to oral diuretics. Discharged after 11 days.   He presents today for his initial visit with a chief complaint of  Review of Systems    Physical Exam  Assessment & Plan:  1: Chronic heart failure with preserved ejection fraction with structural changes (LVH & LAE)- - NYHA class - saw cardiology Cody Alexander) 11/07/20 - BNP 01/04/21 was 93.2  2: HTN- - BP - BMP 01/13/21 reviewed and showed sodium 134, potassium 4.3, creatinine 1.46 and GFR 53  3: COPD-    4: DM- - A1c 01/04/21 was 10.1%

## 2021-01-26 ENCOUNTER — Ambulatory Visit: Admitting: Family

## 2021-01-26 ENCOUNTER — Telehealth: Payer: Self-pay | Admitting: Family

## 2021-01-26 NOTE — Telephone Encounter (Signed)
Patient did not show for his [facility] appointment on 01/26/21. Will attempt to reschedule.  

## 2021-03-22 ENCOUNTER — Emergency Department
Admission: EM | Admit: 2021-03-22 | Discharge: 2021-03-23 | Disposition: A | Discharge status: Home / Self Care | Payer: Medicaid Other | Attending: Emergency Medicine | Admitting: Emergency Medicine

## 2021-03-22 ENCOUNTER — Emergency Department: Payer: Medicaid Other

## 2021-03-22 DIAGNOSIS — Z20822 Contact with and (suspected) exposure to covid-19: Secondary | ICD-10-CM | POA: Diagnosis not present

## 2021-03-22 DIAGNOSIS — R0602 Shortness of breath: Secondary | ICD-10-CM | POA: Diagnosis present

## 2021-03-22 DIAGNOSIS — I509 Heart failure, unspecified: Secondary | ICD-10-CM

## 2021-03-22 DIAGNOSIS — J449 Chronic obstructive pulmonary disease, unspecified: Secondary | ICD-10-CM | POA: Diagnosis not present

## 2021-03-22 DIAGNOSIS — I110 Hypertensive heart disease with heart failure: Secondary | ICD-10-CM | POA: Insufficient documentation

## 2021-03-22 DIAGNOSIS — I5033 Acute on chronic diastolic (congestive) heart failure: Secondary | ICD-10-CM | POA: Insufficient documentation

## 2021-03-22 DIAGNOSIS — Z79899 Other long term (current) drug therapy: Secondary | ICD-10-CM | POA: Insufficient documentation

## 2021-03-22 DIAGNOSIS — Z7984 Long term (current) use of oral hypoglycemic drugs: Secondary | ICD-10-CM | POA: Diagnosis not present

## 2021-03-22 DIAGNOSIS — E1142 Type 2 diabetes mellitus with diabetic polyneuropathy: Secondary | ICD-10-CM | POA: Diagnosis not present

## 2021-03-22 DIAGNOSIS — Z7982 Long term (current) use of aspirin: Secondary | ICD-10-CM | POA: Diagnosis not present

## 2021-03-22 DIAGNOSIS — Z7951 Long term (current) use of inhaled steroids: Secondary | ICD-10-CM | POA: Diagnosis not present

## 2021-03-22 DIAGNOSIS — R609 Edema, unspecified: Secondary | ICD-10-CM | POA: Insufficient documentation

## 2021-03-22 LAB — CBC
HCT: 41.8 % (ref 39.0–52.0)
Hemoglobin: 13.7 g/dL (ref 13.0–17.0)
MCH: 31.2 pg (ref 26.0–34.0)
MCHC: 32.8 g/dL (ref 30.0–36.0)
MCV: 95.2 fL (ref 80.0–100.0)
Platelets: 264 10*3/uL (ref 150–400)
RBC: 4.39 MIL/uL (ref 4.22–5.81)
RDW: 13.2 % (ref 11.5–15.5)
WBC: 8.3 10*3/uL (ref 4.0–10.5)
nRBC: 0 % (ref 0.0–0.2)

## 2021-03-22 LAB — BASIC METABOLIC PANEL
Anion gap: 10 (ref 5–15)
BUN: 17 mg/dL (ref 8–23)
CO2: 31 mmol/L (ref 22–32)
Calcium: 8.9 mg/dL (ref 8.9–10.3)
Chloride: 93 mmol/L — ABNORMAL LOW (ref 98–111)
Creatinine, Ser: 1.21 mg/dL (ref 0.61–1.24)
GFR, Estimated: >60 mL/min (ref >60)
Glucose, Bld: 416 mg/dL — ABNORMAL HIGH (ref 70–99)
Potassium: 4 mmol/L (ref 3.5–5.1)
Sodium: 134 mmol/L — ABNORMAL LOW (ref 135–145)

## 2021-03-22 LAB — BRAIN NATRIURETIC PEPTIDE: B Natriuretic Peptide: 19.8 pg/mL (ref 0.0–100.0)

## 2021-03-22 MED ORDER — PREDNISONE 20 MG PO TABS
60.0000 mg | ORAL_TABLET | ORAL | Status: AC
  Administered 2021-03-23: 60 mg via ORAL
  Filled 2021-03-22: qty 3

## 2021-03-22 MED ORDER — IPRATROPIUM-ALBUTEROL 0.5-2.5 (3) MG/3ML IN SOLN
3.0000 mL | Freq: Once | RESPIRATORY_TRACT | Status: AC
  Administered 2021-03-23: 3 mL via RESPIRATORY_TRACT
  Filled 2021-03-22: qty 3

## 2021-03-22 NOTE — ED Notes (Signed)
First nurse=ems reports p with swelling of legs for 3 days.  cbg 361. Pt on 3 liters oxygen at home  sats 96%   Hx chf

## 2021-03-22 NOTE — ED Triage Notes (Signed)
Patient presents to ER from home. Patient reports he has had BLE swelling for 5 days. Patient reports legs started swelling more and weeping yesterday. Patient reports he was seen in ER yesterday for same, patient reports he was advised to take furosemide 40mg  BID. Patient reports swelling has not improved today and shortness of breath has worsened. Patient also reports high blood sugars at home. Patient A&OX3.

## 2021-03-22 NOTE — ED Provider Notes (Signed)
[facility] Emergency Department Provider Note  ____________________________________________   Event Date/Time   First MD Initiated Contact with Patient 03/22/21 2302     (approximate)  I have reviewed the triage vital signs and the nursing notes.   HISTORY  Chief Complaint Leg Swelling and Shortness of Breath    HPI Cody Alexander is a 65 y.o. male with a history that includes morbid obesity, diabetes, COPD on 2 L of oxygen at baseline, and CHF with a preserved ejection fraction (reportedly 55% on the last echo).  He presents tonight for gradually worsening shortness of breath and cough, worse with exertion, and now severe with exertion.  Symptoms have been worsening over the last couple of weeks.  He said he now cannot walk across the room without coughing and wheezing and having to rest.  He is not having chest pain.  Sometimes his body hurts from coughing.  He said he uses his nebulizer at home but it does not seem to help.  He has not been on prednisone for couple of months.  He went to [facility] yesterday and they gave him IV Lasix and encouraged him to take more Lasix at home and he says he is taken 160 mg of Lasix since yesterday but his symptoms are no better.  He has swelling in both of his legs that has been worsening over time.  He has no abdominal pain, nausea, nor vomiting.  He has not had a recent fever.  He said he has had 3 total vaccinations against COVID-19.  He still smokes occasionally.     Past Medical History:  Diagnosis Date   CHF (congestive heart failure) (HCC)    COPD (chronic obstructive pulmonary disease) (HCC)    Diabetes mellitus without complication (HCC)    Hypertension    Neuropathy     Patient Active Problem List   Diagnosis Date Noted   Benign prostatic hyperplasia with urinary hesitancy    Lower abdominal pain    Elevated troponin    Rash    Uncontrolled type 2 diabetes mellitus with hyperglycemia (HCC)  01/04/2021   Acute on chronic diastolic CHF (congestive heart failure) (HCC) 01/04/2021   CHF (congestive heart failure), NYHA class I, acute, diastolic (HCC) 01/04/2021   OSA (obstructive sleep apnea) 01/04/2021   Obesity (BMI 30-39.9) 01/04/2021   Chronic respiratory failure with hypoxia (HCC) 01/04/2021   Acute on chronic congestive heart failure (HCC)    Acute on chronic respiratory failure with hypoxia (HCC) 06/20/2020   NSTEMI (non-ST elevated myocardial infarction) (HCC) 02/25/2018   Obesity, Class III, BMI 40-49.9 (morbid obesity) (HCC) 01/19/2017   COPD exacerbation (HCC) 05/13/2014   DM type 2 with diabetic peripheral neuropathy (HCC) 05/13/2014   Essential hypertension 05/13/2014    Past Surgical History:  Procedure Laterality Date   LEFT HEART CATH AND CORONARY ANGIOGRAPHY Right 02/27/2018   Procedure: LEFT HEART CATH AND CORONARY ANGIOGRAPHY;  Surgeon: Laurier Nancy, MD;  Location: [facility] INVASIVE CV LAB;  Service: Cardiovascular;  Laterality: Right;    Prior to Admission medications   Medication Sig Start Date End Date Taking? Authorizing Provider  acetaminophen (TYLENOL) 500 MG tablet Take 1,000 mg by mouth every 6 (six) hours as needed. 10/21/20   [provider]  albuterol (PROVENTIL) (2.5 MG/3ML) 0.083% nebulizer solution Take 3 mLs (2.5 mg total) by nebulization every 4 (four) hours as needed for wheezing or shortness of breath. 01/15/21 01/15/22  Tresa Moore, MD  amLODipine (NORVASC) 10  MG tablet Take 1 tablet by mouth daily. 11/14/20   [provider]  aspirin EC 81 MG tablet Take 81 mg by mouth daily. Swallow whole.    [provider]  atorvastatin (LIPITOR) 40 MG tablet Take 1 tablet by mouth daily as needed. 11/14/20   [provider]  chlorpheniramine-HYDROcodone (TUSSIONEX) 10-8 MG/5ML SUER Take 5 mLs by mouth every 8 (eight) hours. 01/15/21   Tresa Moore, MD  furosemide (LASIX) 80 MG tablet Take 80 mg by mouth daily.  11/14/20   [provider]  ipratropium (ATROVENT HFA) 17 MCG/ACT inhaler Inhale 1 puff into the lungs every 6 (six) hours as needed. 02/01/19   [provider]  ipratropium-albuterol (DUONEB) 0.5-2.5 (3) MG/3ML SOLN Inhale 3 mLs into the lungs every 4 (four) hours as needed. 11/10/18   [provider]  isosorbide dinitrate (ISORDIL) 30 MG tablet Take 30 mg by mouth every morning. 11/14/20   [provider]  ketoconazole (NIZORAL) 2 % cream Apply 1 application topically 2 (two) times daily. 06/02/20   [provider]  metFORMIN (GLUCOPHAGE) 1000 MG tablet Take 1,000 mg by mouth 2 (two) times daily. 06/02/20   [provider]  metoprolol tartrate (LOPRESSOR) 25 MG tablet Take 25 mg by mouth 2 (two) times daily.    [provider]  PROAIR HFA 108 [phone] Base) MCG/ACT inhaler Inhale 2 puffs into the lungs every 4 (four) hours as needed for wheezing. 06/02/20   [provider]  spironolactone (ALDACTONE) 25 MG tablet Take 0.5 tablets (12.5 mg total) by mouth daily. 01/16/21 02/15/21  Tresa Moore, MD  SYMBICORT 160-4.5 MCG/ACT inhaler Inhale 2 puffs into the lungs 2 (two) times daily. 06/02/20   [provider]  triamcinolone (KENALOG) 0.025 % cream Apply 1 application topically 2 (two) times daily. 10/31/20   [provider]    Allergies Erythromycin  No family history on file.  Social History Social History   Tobacco Use   Smoking status: Never   Smokeless tobacco: Never  Substance Use Topics   Alcohol use: No   Drug use: Never    Review of Systems Constitutional: No fever/chills Eyes: No visual changes. ENT: No sore throat. Cardiovascular: Denies chest pain. Respiratory: Worsening shortness of breath particular with exertion. +Cough. Gastrointestinal: No abdominal pain.  No nausea, no vomiting.  No diarrhea.  No constipation. Genitourinary: Negative for dysuria. Musculoskeletal: Swelling in bilateral  lower extremities. Integumentary: Negative for rash. Neurological: Negative for headaches, focal weakness or numbness.   ____________________________________________   PHYSICAL EXAM:  VITAL SIGNS: ED Triage Vitals  Enc Vitals Group     BP 03/22/21 1856 119/82     Pulse Rate 03/22/21 1856 85     Resp 03/22/21 1856 18     Temp 03/22/21 1856 98.5 F (36.9 C)     Temp Source 03/22/21 1856 Oral     SpO2 03/22/21 1856 96 %     Weight 03/22/21 1857 136.1 kg (300 lb)     Height 03/22/21 1857 1.854 m (6\' 1" )     Head Circumference --      Peak Flow --      Pain Score 03/22/21 1902 4     Pain Loc --      Pain Edu? --      Excl. in GC? --     Constitutional: Alert and oriented.  Eyes: Conjunctivae are normal.  Head: Atraumatic. Nose: No congestion/rhinnorhea. Mouth/Throat: Patient is wearing a mask. Neck: No  stridor.  No meningeal signs.   Cardiovascular: Normal rate, regular rhythm. Good peripheral circulation. Respiratory: Normal respiratory effort.  No retractions. Gastrointestinal: Morbid obesity.  Soft and nontender. No distention.  Musculoskeletal: Pitting edema in both legs.  Some chronic skin thickening but relatively minimal. Neurologic:  Normal speech and language. No gross focal neurologic deficits are appreciated.  Skin:  Skin is warm, dry and intact. Psychiatric: Mood and affect are normal. Speech and behavior are normal.  ____________________________________________   LABS (all labs ordered are listed, but only abnormal results are displayed)  Labs Reviewed  BASIC METABOLIC PANEL - Abnormal; Notable for the following components:      Result Value   Sodium 134 (*)    Chloride 93 (*)    Glucose, Bld 416 (*)    All other components within normal limits  RESP PANEL BY RT-PCR (FLU A&B, COVID) ARPGX2  CBC  BRAIN NATRIURETIC PEPTIDE   ____________________________________________  EKG  ED ECG REPORT I, Loleta Roseory Aquila Delaughter, the attending physician, personally  viewed and interpreted this ECG.  Date: 03/22/2021 EKG Time: 19: 12 Rate: 86 Rhythm: normal sinus rhythm QRS Axis: normal Intervals: normal ST/T Wave abnormalities: Non-specific ST segment / T-wave changes, but no clear evidence of acute ischemia. Narrative Interpretation: no definitive evidence of acute ischemia; does not meet STEMI criteria.  ____________________________________________  RADIOLOGY I, Loleta Roseory Prateek Knipple, personally viewed and evaluated these images (plain radiographs) as part of my medical decision making, as well as reviewing the written report by the radiologist.  ED MD interpretation: No obvious acute abnormality on chest x-ray  Official radiology report(s): DG Chest 2 View  Result Date: 03/22/2021 CLINICAL DATA:  Shortness of breath. EXAM: CHEST - 2 VIEW COMPARISON:  March 21, 2021. FINDINGS: The heart size and mediastinal contours are within normal limits. Right lung is clear. Mild left basilar subsegmental atelectasis is noted. The visualized skeletal structures are unremarkable. IMPRESSION: Mild left basilar subsegmental atelectasis. Electronically Signed   By: Lupita RaiderJames  Green Jr M.D.   On: 03/22/2021 19:21    ____________________________________________   PROCEDURES   Procedure(s) performed (including Critical Care):  Procedures   ____________________________________________   INITIAL IMPRESSION / MDM / ASSESSMENT AND PLAN / ED COURSE  As part of my medical decision making, I reviewed the following data within the electronic MEDICAL RECORD NUMBER Nursing notes reviewed and incorporated, Labs reviewed , EKG interpreted , Old chart reviewed, Radiograph reviewed , and Notes from prior ED visits   Differential diagnosis includes, but is not limited to, COPD exacerbation, CHF exacerbation, metabolic or electrolyte abnormality, acute kidney injury.  Basic metabolic panel is normal other than hyperglycemia.  CBC is normal, BNP is normal.  I personally reviewed the  patient's imaging and agree with the radiologist's interpretation that there are no obvious acute abnormalities on chest x-ray.  Patient's vital signs are stable including his oxygen level on 2 L by nasal cannula which is his baseline.  Objectively the patient's data is reassuring, but subjectively the patient is frequently coughing and short of breath with any amount of exertion.  He reports that he has taken his much as 160 mg of Lasix over the last 24 hours and it does not seem to be helping.  This is likely a dual issue of CHF and COPD.  I am giving her 3 duonebs prednisone 60 mg by mouth and we will reassess, however the patient may benefit from admission for COPD exacerbation treatment and additional diuresis.     Clinical Course as  of 03/23/21 0738  Fri Mar 23, 2021  0150 Patient says he feels better after the breathing treatments.  He had a episode of desaturation but this is because he is breathing through his mouth and then nasal cannula was on his nose.  He also reports that he uses a CPAP at night.  With the nasal cannula in his mouth, his oxygen saturation was appropriate.  I have reviewed all of his information again as well as his clinical status, and I do not believe he meets criteria to stay in the hospital.  He does not appear to be having a COPD exacerbation in spite of his chronic COPD.  He is slightly volume increased but without any evidence of pulmonary vascular congestion or pulmonary edema.  His vital signs are stable and appropriate.  His symptoms appear to be chronic rather than an acute exacerbation.  I am concerned that a prolonged course of steroids without evidence of clear COPD exacerbation will lead to even worse hyperglycemia so we will hold off on additional steroids at this time.  I listen to him again and he remains clear with no wheezing and no rales nor rhonchi with stethoscope auscultation.  He does have some coarse sounds but they seem to be from his upper airway and  he clears his throat frequently.  I explained all this to the patient and he agrees with the plan to go home and follow-up as an outpatient.  He has been referred previously to [facility] at the heart failure clinic although he did not show up.  I strongly encouraged him to try and get transportation to follow-up in the failure clinic and he agrees to try. [CF]    Clinical Course User Index [CF] Loleta Rose, MD     ____________________________________________  FINAL CLINICAL IMPRESSION(S) / ED DIAGNOSES  Final diagnoses:  Chronic obstructive pulmonary disease, unspecified COPD type (HCC)  Congestive heart failure, unspecified HF chronicity, unspecified heart failure type (HCC)  Peripheral edema     MEDICATIONS GIVEN DURING THIS VISIT:  Medications  ipratropium-albuterol (DUONEB) 0.5-2.5 (3) MG/3ML nebulizer solution 3 mL (3 mLs Nebulization Given 03/23/21 0020)  ipratropium-albuterol (DUONEB) 0.5-2.5 (3) MG/3ML nebulizer solution 3 mL (3 mLs Nebulization Given 03/23/21 0020)  ipratropium-albuterol (DUONEB) 0.5-2.5 (3) MG/3ML nebulizer solution 3 mL (3 mLs Nebulization Given 03/23/21 0019)  predniSONE (DELTASONE) tablet 60 mg (60 mg Oral Given 03/23/21 0019)     ED Discharge Orders          Ordered    AMB referral to CHF clinic       Comments: Patient has been referred to the CHF clinic before but was unable to get transportation.  He said that he should be able to get transportation this time if he has 2 days notice.   03/23/21 0154             Note:  This document was prepared using Dragon voice recognition software and may include unintentional dictation errors.   Loleta Rose, MD 03/23/21 [phone]

## 2021-03-23 LAB — RESP PANEL BY RT-PCR (FLU A&B, COVID) ARPGX2
Influenza A by PCR: NEGATIVE
Influenza B by PCR: NEGATIVE
SARS Coronavirus 2 by RT PCR: NEGATIVE

## 2021-03-23 NOTE — Discharge Instructions (Addendum)
As we discussed, your evaluation was generally reassuring today and spite of your chronic conditions.  Please continue using your Lasix (furosemide) as previously instructed and use all of the regular medicines including your breathing treatments.  Continue to use your CPAP at night.  If you continue to use the higher dose of furosemide, you should see the swelling in your legs go down and your breathing get easier.  In the meantime, please follow-up with the heart failure clinic; you can call their office, but they will likely reach out to you as well to schedule the next available follow-up appointment.    Return to the emergency department if you develop new or worsening symptoms that concern you.

## 2021-03-23 NOTE — Progress Notes (Signed)
Inpatient Diabetes Program Recommendations  AACE/ADA: New Consensus Statement on Inpatient Glycemic Control (2015)  Target Ranges:  Prepandial:   less than 140 mg/dL      Peak postprandial:   less than 180 mg/dL (1-2 hours)      Critically ill patients:  140 - 180 mg/dL   Lab Results  Component Value Date   GLUCAP 223 (H) 01/15/2021   HGBA1C 10.1 (H) 01/04/2021    Review of Glycemic Control  Diabetes history: DM 2 Outpatient Diabetes medications: Metformin 1000 mg bid Current orders for Inpatient glycemic control:  Being evaluated in ED  A1c 10.1% on 01/04/21 Diabetes Coordinator saw pt on 5/13 and had a detailed discussion Pt has been on insulin in the past and prefers insulin pen. Pt would rather be on additional oral medications but is willing to be on insulin if needed.  When d/c'd pt was not placed on additional medications for glucose.  Pt given PO prednisone 60 mg Daily  Inpatient Diabetes Program Recommendations:    If admitted consider: - Add Semglee 25 units - Novolog 0-20 units tid + hs - Novolog 4 units tid meal coverage if eating >50% of meals  Thanks,  Christena Deem RN, MSN, BC-ADM Inpatient Diabetes Coordinator Team Pager [phone] (8a-5p)

## 2021-03-28 ENCOUNTER — Telehealth: Payer: Self-pay | Admitting: Family

## 2021-03-28 ENCOUNTER — Ambulatory Visit: Admitting: Family

## 2021-03-28 NOTE — Telephone Encounter (Signed)
Patient did not show for his Heart Failure Clinic appointment on 03/28/21. Will attempt to reschedule.

## 2021-08-15 ENCOUNTER — Emergency Department: Payer: Medicaid Other

## 2021-08-15 ENCOUNTER — Encounter: Payer: Self-pay | Admitting: Radiology

## 2021-08-15 ENCOUNTER — Inpatient Hospital Stay
Admission: EM | Admit: 2021-08-15 | Discharge: 2021-08-18 | DRG: 291 | Disposition: A | Discharge status: Home / Self Care | Payer: Medicaid Other | Attending: Internal Medicine | Admitting: Internal Medicine

## 2021-08-15 ENCOUNTER — Other Ambulatory Visit: Payer: Self-pay

## 2021-08-15 DIAGNOSIS — Z9981 Dependence on supplemental oxygen: Secondary | ICD-10-CM | POA: Diagnosis not present

## 2021-08-15 DIAGNOSIS — Z9114 Patient's other noncompliance with medication regimen: Secondary | ICD-10-CM

## 2021-08-15 DIAGNOSIS — R0602 Shortness of breath: Secondary | ICD-10-CM | POA: Diagnosis present

## 2021-08-15 DIAGNOSIS — Z8249 Family history of ischemic heart disease and other diseases of the circulatory system: Secondary | ICD-10-CM | POA: Diagnosis not present

## 2021-08-15 DIAGNOSIS — Z881 Allergy status to other antibiotic agents status: Secondary | ICD-10-CM

## 2021-08-15 DIAGNOSIS — E1165 Type 2 diabetes mellitus with hyperglycemia: Secondary | ICD-10-CM | POA: Diagnosis not present

## 2021-08-15 DIAGNOSIS — E875 Hyperkalemia: Secondary | ICD-10-CM | POA: Diagnosis present

## 2021-08-15 DIAGNOSIS — T502X6A Underdosing of carbonic-anhydrase inhibitors, benzothiadiazides and other diuretics, initial encounter: Secondary | ICD-10-CM | POA: Diagnosis present

## 2021-08-15 DIAGNOSIS — I110 Hypertensive heart disease with heart failure: Principal | ICD-10-CM | POA: Diagnosis present

## 2021-08-15 DIAGNOSIS — I10 Essential (primary) hypertension: Secondary | ICD-10-CM | POA: Diagnosis not present

## 2021-08-15 DIAGNOSIS — J441 Chronic obstructive pulmonary disease with (acute) exacerbation: Secondary | ICD-10-CM | POA: Diagnosis present

## 2021-08-15 DIAGNOSIS — E1142 Type 2 diabetes mellitus with diabetic polyneuropathy: Secondary | ICD-10-CM | POA: Diagnosis present

## 2021-08-15 DIAGNOSIS — I5033 Acute on chronic diastolic (congestive) heart failure: Secondary | ICD-10-CM | POA: Diagnosis present

## 2021-08-15 DIAGNOSIS — R0600 Dyspnea, unspecified: Secondary | ICD-10-CM

## 2021-08-15 DIAGNOSIS — Z7984 Long term (current) use of oral hypoglycemic drugs: Secondary | ICD-10-CM | POA: Diagnosis not present

## 2021-08-15 DIAGNOSIS — I509 Heart failure, unspecified: Secondary | ICD-10-CM | POA: Diagnosis not present

## 2021-08-15 DIAGNOSIS — Z79899 Other long term (current) drug therapy: Secondary | ICD-10-CM | POA: Diagnosis not present

## 2021-08-15 DIAGNOSIS — Z7951 Long term (current) use of inhaled steroids: Secondary | ICD-10-CM | POA: Diagnosis not present

## 2021-08-15 DIAGNOSIS — Z20822 Contact with and (suspected) exposure to covid-19: Secondary | ICD-10-CM | POA: Diagnosis present

## 2021-08-15 DIAGNOSIS — Z794 Long term (current) use of insulin: Secondary | ICD-10-CM | POA: Diagnosis not present

## 2021-08-15 DIAGNOSIS — J9611 Chronic respiratory failure with hypoxia: Secondary | ICD-10-CM | POA: Diagnosis present

## 2021-08-15 DIAGNOSIS — J449 Chronic obstructive pulmonary disease, unspecified: Secondary | ICD-10-CM

## 2021-08-15 DIAGNOSIS — Z6841 Body Mass Index (BMI) 40.0 and over, adult: Secondary | ICD-10-CM | POA: Diagnosis not present

## 2021-08-15 DIAGNOSIS — R251 Tremor, unspecified: Secondary | ICD-10-CM | POA: Diagnosis not present

## 2021-08-15 DIAGNOSIS — I5031 Acute diastolic (congestive) heart failure: Secondary | ICD-10-CM | POA: Diagnosis not present

## 2021-08-15 DIAGNOSIS — I5021 Acute systolic (congestive) heart failure: Secondary | ICD-10-CM | POA: Diagnosis not present

## 2021-08-15 LAB — BLOOD GAS, VENOUS
Acid-Base Excess: 10.4 mmol/L — ABNORMAL HIGH (ref 0.0–2.0)
Bicarbonate: 38.2 mmol/L — ABNORMAL HIGH (ref 20.0–28.0)
Delivery systems: POSITIVE
FIO2: 0.9
Mechanical Rate: 10
O2 Saturation: 94 %
Patient temperature: 37
RATE: 10 resp/min
pCO2, Ven: 66 mmHg — ABNORMAL HIGH (ref 44.0–60.0)
pH, Ven: 7.37 (ref 7.250–7.430)
pO2, Ven: 73 mmHg — ABNORMAL HIGH (ref 32.0–45.0)

## 2021-08-15 LAB — CBC
HCT: 39.7 % (ref 39.0–52.0)
Hemoglobin: 12.2 g/dL — ABNORMAL LOW (ref 13.0–17.0)
MCH: 29.3 pg (ref 26.0–34.0)
MCHC: 30.7 g/dL (ref 30.0–36.0)
MCV: 95.2 fL (ref 80.0–100.0)
Platelets: 166 10*3/uL (ref 150–400)
RBC: 4.17 MIL/uL — ABNORMAL LOW (ref 4.22–5.81)
RDW: 13.7 % (ref 11.5–15.5)
WBC: 10.7 10*3/uL — ABNORMAL HIGH (ref 4.0–10.5)
nRBC: 0 % (ref 0.0–0.2)

## 2021-08-15 LAB — RESP PANEL BY RT-PCR (FLU A&B, COVID) ARPGX2
Influenza A by PCR: NEGATIVE
Influenza B by PCR: NEGATIVE
SARS Coronavirus 2 by RT PCR: NEGATIVE

## 2021-08-15 LAB — COMPREHENSIVE METABOLIC PANEL
ALT: 37 U/L (ref 0–44)
AST: 28 U/L (ref 15–41)
Albumin: 3.1 g/dL — ABNORMAL LOW (ref 3.5–5.0)
Alkaline Phosphatase: 44 U/L (ref 38–126)
Anion gap: 6 (ref 5–15)
BUN: 15 mg/dL (ref 8–23)
CO2: 34 mmol/L — ABNORMAL HIGH (ref 22–32)
Calcium: 8.3 mg/dL — ABNORMAL LOW (ref 8.9–10.3)
Chloride: 98 mmol/L (ref 98–111)
Creatinine, Ser: 0.77 mg/dL (ref 0.61–1.24)
GFR, Estimated: >60 mL/min (ref >60)
Glucose, Bld: 385 mg/dL — ABNORMAL HIGH (ref 70–99)
Potassium: 4.7 mmol/L (ref 3.5–5.1)
Sodium: 138 mmol/L (ref 135–145)
Total Bilirubin: 0.9 mg/dL (ref 0.3–1.2)
Total Protein: 5.9 g/dL — ABNORMAL LOW (ref 6.5–8.1)

## 2021-08-15 LAB — BRAIN NATRIURETIC PEPTIDE: B Natriuretic Peptide: 136.7 pg/mL — ABNORMAL HIGH (ref 0.0–100.0)

## 2021-08-15 LAB — TROPONIN I (HIGH SENSITIVITY): Troponin I (High Sensitivity): 105 ng/L (ref <18)

## 2021-08-15 MED ORDER — FUROSEMIDE 10 MG/ML IJ SOLN
60.0000 mg | Freq: Once | INTRAMUSCULAR | Status: AC
  Administered 2021-08-15: 20:00:00 60 mg via INTRAVENOUS
  Filled 2021-08-15: qty 8

## 2021-08-15 MED ORDER — HYDROCOD POLST-CPM POLST ER 10-8 MG/5ML PO SUER
5.0000 mL | Freq: Three times a day (TID) | ORAL | Status: DC | PRN
  Administered 2021-08-15 – 2021-08-17 (×6): 5 mL via ORAL
  Filled 2021-08-15 (×6): qty 5

## 2021-08-15 MED ORDER — FUROSEMIDE 10 MG/ML IJ SOLN: 40.0000 mg | Freq: Two times a day (BID) | INTRAMUSCULAR | Status: DC

## 2021-08-15 MED ORDER — METOPROLOL TARTRATE 25 MG PO TABS
25.0000 mg | ORAL_TABLET | Freq: Two times a day (BID) | ORAL | Status: DC
  Administered 2021-08-15 – 2021-08-18 (×6): 25 mg via ORAL
  Filled 2021-08-15 (×6): qty 1

## 2021-08-15 MED ORDER — ISOSORBIDE DINITRATE 30 MG PO TABS
30.0000 mg | ORAL_TABLET | Freq: Every morning | ORAL | Status: DC
  Administered 2021-08-16 – 2021-08-18 (×3): 30 mg via ORAL
  Filled 2021-08-15 (×3): qty 1

## 2021-08-15 MED ORDER — INSULIN ASPART 100 UNIT/ML IJ SOLN
0.0000 [IU] | Freq: Three times a day (TID) | INTRAMUSCULAR | Status: DC
  Administered 2021-08-16: 09:00:00 9 [IU] via SUBCUTANEOUS
  Filled 2021-08-15: qty 1

## 2021-08-15 MED ORDER — SODIUM CHLORIDE 0.9 % IV SOLN: 250.0000 mL | INTRAVENOUS | Status: DC | PRN

## 2021-08-15 MED ORDER — LABETALOL HCL 5 MG/ML IV SOLN
10.0000 mg | Freq: Once | INTRAVENOUS | Status: AC
  Administered 2021-08-15: 20:00:00 10 mg via INTRAVENOUS
  Filled 2021-08-15: qty 4

## 2021-08-15 MED ORDER — HEPARIN SODIUM (PORCINE) 5000 UNIT/ML IJ SOLN
5000.0000 [IU] | Freq: Three times a day (TID) | INTRAMUSCULAR | Status: DC
  Administered 2021-08-15 – 2021-08-17 (×7): 5000 [IU] via SUBCUTANEOUS
  Filled 2021-08-15 (×7): qty 1

## 2021-08-15 MED ORDER — LABETALOL HCL 5 MG/ML IV SOLN
10.0000 mg | Freq: Once | INTRAVENOUS | Status: AC
  Administered 2021-08-15: 22:00:00 10 mg via INTRAVENOUS
  Filled 2021-08-15: qty 4

## 2021-08-15 MED ORDER — ASPIRIN EC 81 MG PO TBEC
81.0000 mg | DELAYED_RELEASE_TABLET | Freq: Every day | ORAL | Status: DC
  Administered 2021-08-15 – 2021-08-18 (×4): 81 mg via ORAL
  Filled 2021-08-15 (×4): qty 1

## 2021-08-15 MED ORDER — IPRATROPIUM-ALBUTEROL 0.5-2.5 (3) MG/3ML IN SOLN
3.0000 mL | RESPIRATORY_TRACT | Status: DC | PRN
  Administered 2021-08-16: 05:00:00 3 mL via RESPIRATORY_TRACT
  Filled 2021-08-15: qty 3

## 2021-08-15 MED ORDER — FLUTICASONE FUROATE-VILANTEROL 200-25 MCG/ACT IN AEPB
1.0000 | INHALATION_SPRAY | Freq: Every day | RESPIRATORY_TRACT | Status: DC
  Administered 2021-08-16 – 2021-08-18 (×3): 1 via RESPIRATORY_TRACT
  Filled 2021-08-15: qty 28

## 2021-08-15 MED ORDER — LISINOPRIL 10 MG PO TABS: 20.0000 mg | ORAL_TABLET | Freq: Every day | ORAL | Status: DC

## 2021-08-15 MED ORDER — FUROSEMIDE 10 MG/ML IJ SOLN
40.0000 mg | Freq: Two times a day (BID) | INTRAMUSCULAR | Status: DC
  Administered 2021-08-15: 40 mg via INTRAVENOUS
  Filled 2021-08-15: qty 4

## 2021-08-15 MED ORDER — SODIUM CHLORIDE 0.9% FLUSH: 3.0000 mL | INTRAVENOUS | Status: DC | PRN

## 2021-08-15 MED ORDER — NITROGLYCERIN 2 % TD OINT
1.0000 [in_us] | TOPICAL_OINTMENT | Freq: Once | TRANSDERMAL | Status: AC
  Administered 2021-08-15: 20:00:00 1 [in_us] via TOPICAL
  Filled 2021-08-15: qty 1

## 2021-08-15 MED ORDER — ASPIRIN EC 81 MG PO TBEC: 81.0000 mg | DELAYED_RELEASE_TABLET | Freq: Every day | ORAL | Status: DC

## 2021-08-15 MED ORDER — SODIUM CHLORIDE 0.9% FLUSH
3.0000 mL | Freq: Two times a day (BID) | INTRAVENOUS | Status: DC
  Administered 2021-08-15 – 2021-08-18 (×4): 3 mL via INTRAVENOUS

## 2021-08-15 MED ORDER — IOHEXOL 300 MG/ML  SOLN
75.0000 mL | Freq: Once | INTRAMUSCULAR | Status: AC | PRN
  Administered 2021-08-15: 20:00:00 75 mL via INTRAVENOUS

## 2021-08-15 NOTE — ED Notes (Signed)
Pt provided a sandwich tray & water. 

## 2021-08-15 NOTE — ED Provider Notes (Signed)
[facility] Emergency Department Provider Note  Time seen: 7:06 PM  I have reviewed the triage vital signs and the nursing notes.   HISTORY  Chief Complaint Shortness of Breath (C/o SOB x2 days increasing in severity today. Medic reports O2 sat mid 80's. Pt. On 2L [place] at home baseline. Pt. Has not taken lasix x2 days.)   HPI Cody Alexander is a 65 y.o. male with a past medical history of CHF, COPD, diabetes, hypertension, presents to the emergency department for worsening shortness of breath.  According to the patient and EMS report patient has had worsening shortness of breath over the past 2 days, wears 2 L nasal cannula at home, found to be satting in the 70s on 2 L, placed on nonrebreather mask.  Patient given 2 DuoNeb's, 125 mg of Solu-Medrol, 2 g of magnesium, albuterol nebulizer, and 1 racemic epi nebulizer.  Patient does have an occasional end expiratory stridulous sound.  States he is feeling a little better but largely unchanged.  Patient is hypertensive 184/68, has not been taking his diuretic over the past several days per patient.  Has a history of COPD as well as CHF.  Patient states he was recently diagnosed with some sort of throat infection, is not clear if the patient is currently taking antibiotics.   Past Medical History:  Diagnosis Date   CHF (congestive heart failure) (HCC)    COPD (chronic obstructive pulmonary disease) (HCC)    Diabetes mellitus without complication (HCC)    Hypertension    Neuropathy     Patient Active Problem List   Diagnosis Date Noted   Benign prostatic hyperplasia with urinary hesitancy    Lower abdominal pain    Elevated troponin    Rash    Uncontrolled type 2 diabetes mellitus with hyperglycemia (HCC) 01/04/2021   Acute on chronic diastolic CHF (congestive heart failure) (HCC) 01/04/2021   CHF (congestive heart failure), NYHA class I, acute, diastolic (HCC) 01/04/2021   OSA (obstructive sleep apnea) 01/04/2021    Obesity (BMI 30-39.9) 01/04/2021   Chronic respiratory failure with hypoxia (HCC) 01/04/2021   Acute on chronic congestive heart failure (HCC)    Acute on chronic respiratory failure with hypoxia (HCC) 06/20/2020   NSTEMI (non-ST elevated myocardial infarction) (HCC) 02/25/2018   Obesity, Class III, BMI 40-49.9 (morbid obesity) (HCC) 01/19/2017   COPD exacerbation (HCC) 05/13/2014   DM type 2 with diabetic peripheral neuropathy (HCC) 05/13/2014   Essential hypertension 05/13/2014    Past Surgical History:  Procedure Laterality Date   LEFT HEART CATH AND CORONARY ANGIOGRAPHY Right 02/27/2018   Procedure: LEFT HEART CATH AND CORONARY ANGIOGRAPHY;  Surgeon: Laurier Nancy, MD;  Location: [facility] INVASIVE CV LAB;  Service: Cardiovascular;  Laterality: Right;    Prior to Admission medications   Medication Sig Start Date End Date Taking? Authorizing Provider  acetaminophen (TYLENOL) 500 MG tablet Take 1,000 mg by mouth every 6 (six) hours as needed. 10/21/20   [provider]  albuterol (PROVENTIL) (2.5 MG/3ML) 0.083% nebulizer solution Take 3 mLs (2.5 mg total) by nebulization every 4 (four) hours as needed for wheezing or shortness of breath. 01/15/21 01/15/22  Lolita Patella B, MD  amLODipine (NORVASC) 10 MG tablet Take 1 tablet by mouth daily. 11/14/20   [provider]  aspirin EC 81 MG tablet Take 81 mg by mouth daily. Swallow whole.    [provider]  atorvastatin (LIPITOR) 40 MG tablet Take 1 tablet by mouth daily as needed. 11/14/20  [provider]  chlorpheniramine-HYDROcodone (TUSSIONEX) 10-8 MG/5ML SUER Take 5 mLs by mouth every 8 (eight) hours. 01/15/21   Tresa Moore, MD  furosemide (LASIX) 80 MG tablet Take 80 mg by mouth daily. 11/14/20   [provider]  ipratropium (ATROVENT HFA) 17 MCG/ACT inhaler Inhale 1 puff into the lungs every 6 (six) hours as needed. 02/01/19   [provider]  ipratropium-albuterol (DUONEB)  0.5-2.5 (3) MG/3ML SOLN Inhale 3 mLs into the lungs every 4 (four) hours as needed. 11/10/18   [provider]  isosorbide dinitrate (ISORDIL) 30 MG tablet Take 30 mg by mouth every morning. 11/14/20   [provider]  ketoconazole (NIZORAL) 2 % cream Apply 1 application topically 2 (two) times daily. 06/02/20   [provider]  metFORMIN (GLUCOPHAGE) 1000 MG tablet Take 1,000 mg by mouth 2 (two) times daily. 06/02/20   [provider]  metoprolol tartrate (LOPRESSOR) 25 MG tablet Take 25 mg by mouth 2 (two) times daily.    [provider]  PROAIR HFA [phone] Base) MCG/ACT inhaler Inhale 2 puffs into the lungs every 4 (four) hours as needed for wheezing. 06/02/20   [provider]  spironolactone (ALDACTONE) 25 MG tablet Take 0.5 tablets (12.5 mg total) by mouth daily. 01/16/21 02/15/21  Tresa Moore, MD  SYMBICORT 160-4.5 MCG/ACT inhaler Inhale 2 puffs into the lungs 2 (two) times daily. 06/02/20   [provider]  triamcinolone (KENALOG) 0.025 % cream Apply 1 application topically 2 (two) times daily. 10/31/20   [provider]    Allergies  Allergen Reactions   Erythromycin Anaphylaxis and Swelling    Had eye swelling with erythromycin during a time when he had perf ear drum.  Tolerated azithromycin.    No family history on file.  Social History Social History   Tobacco Use   Smoking status: Never   Smokeless tobacco: Never  Substance Use Topics   Alcohol use: No   Drug use: Never    Review of Systems Constitutional: Negative for fever. Cardiovascular: Negative for chest pain. Respiratory: Shortness of breath.  Slight cough Gastrointestinal: Negative for abdominal pain, vomiting  Genitourinary: Negative for urinary compaints Musculoskeletal: Mild increase in lower extremity edema. Neurological: Negative for headache All other ROS negative  ____________________________________________   PHYSICAL  EXAM:  VITAL SIGNS: ED Triage Vitals  Enc Vitals Group     BP 08/15/21 1851 (!) 166/92     Pulse Rate 08/15/21 1847 78     Resp 08/15/21 1847 19     Temp 08/15/21 1847 99.4 F (37.4 C)     Temp Source 08/15/21 1847 Oral     SpO2 08/15/21 1847 99 %     Weight 08/15/21 1851 (!) 315 lb (142.9 kg)     Height 08/15/21 1851 6\' 1"  (1.854 m)     Head Circumference --      Peak Flow --      Pain Score 08/15/21 1850 2     Pain Loc --      Pain Edu? --      Excl. in GC? --    Constitutional: Alert and oriented. Well appearing and in no distress. Eyes: Normal exam ENT      Head: Normocephalic and atraumatic.      Mouth/Throat: Mucous membranes are moist. Cardiovascular: Normal rate, regular rhythm.  Respiratory: Normal respiratory effort without tachypnea nor retractions. Breath sounds are clear Gastrointestinal: Soft and nontender. No distention. Musculoskeletal: Nontender with normal range  of motion in all extremities Neurologic:  Normal speech and language. No gross focal neurologic deficits  Skin:  Skin is warm, dry and intact.  Psychiatric: Mood and affect are normal.   ____________________________________________    EKG  EKG viewed and interpreted by myself shows what appears to be a sinus rhythm at 81 bpm with a narrow QRS, normal axis, normal intervals, no concerning ST changes.  ____________________________________________    RADIOLOGY  CT is negative for acute abnormality of the neck. Chest x-ray shows vascular congestion. ____________________________________________   INITIAL IMPRESSION / ASSESSMENT AND PLAN / ED COURSE  Pertinent labs & imaging results that were available during my care of the patient were reviewed by me and considered in my medical decision making (see chart for details).   Patient presents emergency department for worsening shortness of breath over the past 2 days.  Per report patient was recently diagnosed some sort of throat infection, has  had chills at home, sore throat now over the past 2 days worsening shortness of breath as well as occasional cough.  Patient is hypoxic, we will place the patient on BiPAP.  Patient does have mild expiratory wheezing in all lung fields has 1+ peripheral edema bilaterally also has some occasional end expiratory stridulous sound although no inspiratory stridor on exam.  Patient already given racemic epi by EMS.  We will obtain CT imaging of the neck to rule out epiglottitis.  Differential would also include CHF exacerbation, COPD exacerbation, pneumonia, pneumothorax, ACS, infectious etiology such as COVID/flu.  COVID and flu negative.  Patient was on BiPAP did well on BiPAP but is now lastingly, BiPAP placed on 6 L seems to be tolerating 6 L okay satting in the mid upper 90s.  Patient's work-up shows an elevated troponin around 100, his historical troponins have been elevated as well.  BNP is elevated.  Given the patient's shortness of breath initial hypoxia lower extremity edema significant hypertension highly suspect CHF exacerbation.  Patient given 1 inch of nitroglycerin ointment has received 2 rounds of 10 mg of IV labetalol to help reduce afterload.  Patient received 60 mg of IV Lasix.  We will admit to the hospital service for further work-up and treatment.  Patient agreeable to plan of care.  Cody Alexander was evaluated in Emergency Department on 08/15/2021 for the symptoms described in the history of present illness. He was evaluated in the context of the global COVID-19 pandemic, which necessitated consideration that the patient might be at risk for infection with the SARS-CoV-2 virus that causes COVID-19. Institutional protocols and algorithms that pertain to the evaluation of patients at risk for COVID-19 are in a state of rapid change based on information released by regulatory bodies including the CDC and federal and state organizations. These policies and algorithms were followed during the  patient's care in the ED.  CRITICAL CARE Performed by: Minna Antis   Total critical care time: 30 minutes  Critical care time was exclusive of separately billable procedures and treating other patients.  Critical care was necessary to treat or prevent imminent or life-threatening deterioration.  Critical care was time spent personally by me on the following activities: development of treatment plan with patient and/or surrogate as well as nursing, discussions with consultants, evaluation of patient's response to treatment, examination of patient, obtaining history from patient or surrogate, ordering and performing treatments and interventions, ordering and review of laboratory studies, ordering and review of radiographic studies, pulse oximetry and re-evaluation of patient's condition.  ____________________________________________  FINAL CLINICAL IMPRESSION(S) / ED DIAGNOSES  Dyspnea CHF exacerbation   Minna Antis, MD 08/15/21 2125

## 2021-08-15 NOTE — H&P (Signed)
History and Physical    Cody Alexander WUJ:[id number] DOB: Sep 06, 1955 DOA: 08/15/2021  PCP: Mckinley Jewel, FNP    Patient coming from:  Home    Chief Complaint:  SOB.    HPI:  Cody Alexander is a 65 y.o. male seen in ed with complaints of shortness of breath that is been going on for the past 2 days patient states that he has been using his oxygen.  He is on home oxygen at 2 L.  He is can go [facility] which is [facility] he [facility].  He was so short winded that he had to call rescue and come to the hospital as soon as possible EMS had given him DuoNeb and breathing treatments along with Solu-Medrol and magnesium and some racemic epi and he feels a little bit better but not completely resolved and not well enough.  Patient is hypertensive and has not taken his diuretic in the past few days he has a history of COPD along with CHF.  Pt has past medical history of CHF, COPD, diabetes mellitus type 2, hypertension, neuropathy.  ED Course:  Vitals:   08/15/21 1851 08/15/21 1900 08/15/21 1933 08/15/21 1945  BP: (!) 166/92 (!) 184/68 (!) 174/113 (!) 190/144  Pulse:  (!) 110 85 93  Resp:  (!) 26 (!) 21 17  Temp:      TempSrc:      SpO2:  94% 99% 97%  Weight: (!) 142.9 kg     Height: 6\' 1"  (1.854 m)     In the emergency room patient is alert awake oriented hypoxic on room air does not allow the BiPAP and refuses to wear it and is hypoxic blood current oxygen settings at 6 L nasal cannula, and patient meets SIRS criteria on arrival and is hypertensive urgency on arrival.  Patient was given nitro ointment and labetalol for his blood pressure along with Lasix for his volume overload.  He is uncooperative in the emergency room and refuses BiPAP and demands we treat his breathing with oxygen via nasal cannula.  Review of Systems:  Review of Systems  Respiratory:  Positive for shortness of breath and wheezing.   Cardiovascular:  Positive for orthopnea.  All other systems reviewed and are  negative.   Past Medical History:  Diagnosis Date   CHF (congestive heart failure) (HCC)    COPD (chronic obstructive pulmonary disease) (HCC)    Diabetes mellitus without complication (HCC)    Hypertension    Neuropathy     Past Surgical History:  Procedure Laterality Date   LEFT HEART CATH AND CORONARY ANGIOGRAPHY Right 02/27/2018   Procedure: LEFT HEART CATH AND CORONARY ANGIOGRAPHY;  Surgeon: 04/30/2018, MD;  Location: [facility] INVASIVE CV LAB;  Service: Cardiovascular;  Laterality: Right;     reports that he has never smoked. He has never used smokeless tobacco. He reports that he does not drink alcohol and does not use drugs.  Allergies  Allergen Reactions   Erythromycin Anaphylaxis and Swelling    Had eye swelling with erythromycin during a time when he had perf ear drum.  Tolerated azithromycin.    Family History  Problem Relation Age of Onset   Stroke Mother    Hypertension Father     Prior to Admission medications   Medication Sig Start Date End Date Taking? Authorizing Provider  acetaminophen (TYLENOL) 500 MG tablet Take 1,000 mg by mouth every 6 (six) hours as needed. 10/21/20  Yes [provider]  amLODipine (NORVASC) 10  MG tablet Take 1 tablet by mouth daily. 11/14/20  Yes [provider]  atorvastatin (LIPITOR) 40 MG tablet Take 1 tablet by mouth daily. 11/14/20  Yes [provider]  chlorpheniramine-HYDROcodone (TUSSIONEX) 10-8 MG/5ML SUER Take 5 mLs by mouth every 8 (eight) hours. 01/15/21  Yes Sreenath, Sudheer B, MD  furosemide (LASIX) 80 MG tablet Take 160 mg by mouth 2 (two) times daily. 11/14/20  Yes [provider]  insulin lispro (HUMALOG) 100 UNIT/ML injection Inject 16 Units into the skin in the morning, at noon, and at bedtime. 08/04/21 09/03/21 Yes [provider]  ipratropium (ATROVENT HFA) 17 MCG/ACT inhaler Inhale 1 puff into the lungs every 6 (six) hours as needed. 02/01/19  Yes [provider]   ipratropium-albuterol (DUONEB) 0.5-2.5 (3) MG/3ML SOLN Inhale 3 mLs into the lungs every 4 (four) hours as needed. 11/10/18  Yes [provider]  isosorbide dinitrate (ISORDIL) 30 MG tablet Take 30 mg by mouth every morning. 11/14/20  Yes [provider]  LANTUS 100 UNIT/ML injection Inject 50 Units into the skin at bedtime. 08/04/21  Yes [provider]  lisinopril (ZESTRIL) 20 MG tablet Take 20 mg by mouth daily. 08/04/21  Yes [provider]  metFORMIN (GLUCOPHAGE) 1000 MG tablet Take 1,000 mg by mouth 2 (two) times daily. 06/02/20  Yes [provider]  metoprolol tartrate (LOPRESSOR) 25 MG tablet Take 25 mg by mouth 2 (two) times daily.   Yes [provider]  PROAIR HFA 108 (90 Base) MCG/ACT inhaler Inhale 2 puffs into the lungs every 4 (four) hours as needed for wheezing. 06/02/20  Yes [provider]  SYMBICORT 160-4.5 MCG/ACT inhaler Inhale 2 puffs into the lungs 2 (two) times daily. 06/02/20  Yes [provider]  albuterol (PROVENTIL) (2.5 MG/3ML) 0.083% nebulizer solution Take 3 mLs (2.5 mg total) by nebulization every 4 (four) hours as needed for wheezing or shortness of breath. Patient not taking: Reported on 08/15/2021 01/15/21 01/15/22  Tresa Moore, MD  aspirin EC 81 MG tablet Take 81 mg by mouth daily. Swallow whole. Patient not taking: Reported on 08/15/2021    [provider]  ketoconazole (NIZORAL) 2 % cream Apply 1 application topically 2 (two) times daily. Patient not taking: Reported on 08/15/2021 06/02/20   [provider]    Physical Exam: Vitals:   08/15/21 1851 08/15/21 1900 08/15/21 1933 08/15/21 1945  BP: (!) 166/92 (!) 184/68 (!) 174/113 (!) 190/144  Pulse:  (!) 110 85 93  Resp:  (!) 26 (!) 21 17  Temp:      TempSrc:      SpO2:  94% 99% 97%  Weight: (!) 142.9 kg     Height: 6\' 1"  (1.854 m)      Physical Exam Constitutional:      General: He is in acute distress.      Appearance: He is obese. He is not ill-appearing, toxic-appearing or diaphoretic.  HENT:     Head: Normocephalic and atraumatic.     Right Ear: External ear normal.     Left Ear: External ear normal.     Nose: Nose normal.     Mouth/Throat:     Mouth: Mucous membranes are moist.     Pharynx: No oropharyngeal exudate.  Eyes:     Extraocular Movements: Extraocular movements intact.     Pupils: Pupils are equal, round, and reactive to light.  Neck:     Vascular: No carotid bruit.  Cardiovascular:  Rate and Rhythm: Normal rate and regular rhythm.     Pulses: Normal pulses.          Dorsalis pedis pulses are 2+ on the right side and 2+ on the left side.       Posterior tibial pulses are 2+ on the right side and 2+ on the left side.     Heart sounds: Normal heart sounds.  Pulmonary:     Effort: Pulmonary effort is normal.     Breath sounds: Rales present.  Abdominal:     General: Bowel sounds are normal. There is no distension.     Palpations: Abdomen is soft. There is no mass.     Tenderness: There is no abdominal tenderness. There is no guarding.     Hernia: No hernia is present.  Musculoskeletal:     Right lower leg: Edema present.     Left lower leg: Edema present.     Left foot: No deformity.       Feet:  Feet:     Left foot:     Skin integrity: Skin breakdown, callus and dry skin present.  Skin:    General: Skin is warm.  Neurological:     General: No focal deficit present.     Mental Status: He is alert and oriented to person, place, and time.     Motor: Weakness present.     Coordination: Coordination normal.  Psychiatric:        Mood and Affect: Mood normal.        Behavior: Behavior normal.     Labs on Admission: I have personally reviewed following labs and imaging studies  No results for input(s): CKTOTAL, CKMB, TROPONINI in the last 72 hours. Lab Results  Component Value Date   WBC 10.7 (H) 08/15/2021   HGB 12.2 (L) 08/15/2021   HCT 39.7 08/15/2021    MCV 95.2 08/15/2021   PLT 166 08/15/2021    Recent Labs  Lab 08/15/21 1905  NA 138  K 4.7  CL 98  CO2 34*  BUN 15  CREATININE 0.77  CALCIUM 8.3*  PROT 5.9*  BILITOT 0.9  ALKPHOS 44  ALT 37  AST 28  GLUCOSE 385*   Lab Results  Component Value Date   CHOL 173 01/04/2021   HDL 54 01/04/2021   LDLCALC 103 (H) 01/04/2021   TRIG 82 01/04/2021   No results found for: DDIMER Invalid input(s): POCBNP   COVID-19 Labs No results for input(s): DDIMER, FERRITIN, LDH, CRP in the last 72 hours. Lab Results  Component Value Date   SARSCOV2NAA NEGATIVE 08/15/2021   SARSCOV2NAA NEGATIVE 03/23/2021   SARSCOV2NAA NEGATIVE 01/04/2021   SARSCOV2NAA NEGATIVE 06/20/2020    Radiological Exams on Admission: CT Soft Tissue Neck W Contrast  Result Date: 08/15/2021 CLINICAL DATA:  Initial evaluation for acute epiglottitis or tonsillitis. EXAM: CT NECK WITH CONTRAST TECHNIQUE: Multidetector CT imaging of the neck was performed using the standard protocol following the bolus administration of intravenous contrast. CONTRAST:  75mL OMNIPAQUE IOHEXOL 300 MG/ML  SOLN COMPARISON:  None available. FINDINGS: Pharynx and larynx: Oral cavity within normal limits. Poor dentition with multiple scattered dental caries noted. No associated inflammatory changes. Palatine tonsils symmetric and within normal limits without evidence for acute tonsillitis. No tonsillar or peritonsillar abscess. Remainder of the oropharynx and nasopharynx within normal limits. Epiglottis within normal limits without evidence for acute epiglottitis. Vallecula clear. No retropharyngeal collection or swelling. Hypopharynx and supraglottic larynx within normal limits. Glottis normal. Subglottic airway  patent clear. Salivary glands: Salivary glands including the parotid and submandibular glands are normal. Thyroid: Normal. Lymph nodes: No enlarged or pathologic adenopathy seen within the neck. Vascular: Normal intravascular enhancement seen  throughout the neck. Mild atheromatous change about the aortic arch and carotid bifurcations without significant stenosis. Vascular calcifications noted within the visualized carotid siphons as well. Both carotid artery systems partially medialized into the retropharyngeal space. Limited intracranial: Unremarkable. Visualized orbits: Unremarkable. Mastoids and visualized paranasal sinuses: Mild scattered mucosal thickening noted within the sphenoid ethmoidal and maxillary sinuses. No air-fluid levels to suggest acute sinusitis. Visualized mastoid air cells and middle ear cavities are well pneumatized and free of fluid. Skeleton: No discrete or worrisome osseous lesions. Mild multilevel cervical spondylosis, most pronounced at C5-6. No significant spinal stenosis. Upper chest: Few subcentimeter foci of ground-glass opacity noted within the partially visualized left lung, favored to reflect subsegmental atelectatic changes. Visualized upper chest demonstrates no other acute finding. Other: None. IMPRESSION: 1. Negative CT of the neck. No acute inflammatory changes or other abnormality identified. 2. Poor dentition with multiple scattered dental caries. No associated inflammatory changes. 3.  Aortic Atherosclerosis (ICD10-I70.0). Electronically Signed   By: Rise Mu M.D.   On: 08/15/2021 20:46   DG Chest Portable 1 View  Result Date: 08/15/2021 CLINICAL DATA:  Shortness of breath for 2 days EXAM: PORTABLE CHEST 1 VIEW COMPARISON:  03/22/2021 FINDINGS: Cardiac shadow is enlarged but accentuated by the portable technique. Lungs are well aerated bilaterally without focal infiltrate. Mild central vascular congestion is noted. No bony abnormality is seen. IMPRESSION: Mild vascular congestion without interstitial edema or focal infiltrate. Electronically Signed   By: Alcide Clever M.D.   On: 08/15/2021 19:31    EKG: Independently reviewed.  Pending - pt removes his leads.    Assessment/Plan: Principal  Problem:   Acute CHF (congestive heart failure) (HCC) Active Problems:   DM type 2 with diabetic peripheral neuropathy (HCC)   Essential hypertension   Uncontrolled type 2 diabetes mellitus with hyperglycemia (HCC)   Acute CHF: We will obtain 2 d echo and continue diuresis .  Strict  i/o. Daily weights supplemental oxygen as needed. Daily weights.cont home meds continue amlodipine, Lasix, Imdur, lisinopril. Follow cmp and correct electrolytes.   Diabetes mellitus type 2: Sliding scale insulin, glycemic coverage.  Acute on chronic congestive heart failure   DVT prophylaxis:  Heparin  Code Status:  Full code  Family Communication:  Lalla Brothers (Other)  [phone] (Mobile)   Disposition Plan:  Home  Consults called:  None  Admission status: Inpatient   Gertha Calkin MD [facility] [phone] How to contact the [facility] Attending or Consulting provider 7A - 7P or covering provider during after hours 7P -7A, for this patient.    Check the care team in [facility] and look for a) attending/consulting [facility] provider listed and b) the [facility] team listed Log into [email or web address] and use [place]'s universal password to access. If you do not have the password, please contact the hospital operator. Locate the [facility] provider you are looking for under [facility] and page to a number that you can be directly reached. If you still have difficulty reaching the provider, please page the [facility] (Director on Call) for the Hospitalists listed on amion for assistance. [email or web address] Password [email or web address] 08/15/2021, 10:23 PM

## 2021-08-15 NOTE — ED Triage Notes (Signed)
C/o SOB x2 days increasing in severity today. Medic reports O2 sat mid 80's. Pt. On 2L [place] at home baseline. Pt. Has not taken lasix x2 days. Pt. Received duoneb x2, 125mg  solumedrol, 2g mag sulfate, albuterol, and racemicepi en route.

## 2021-08-15 NOTE — ED Notes (Signed)
Critical trop:105 Dr. Lenard Lance notified :[id number]

## 2021-08-15 NOTE — ED Notes (Signed)
Dr. Allena Katz at bedside for assessment/ admission

## 2021-08-15 NOTE — ED Notes (Signed)
ED MD notified by this RN that pt. Is not tolerating bipap very well, and is having coughing spells where he is getting dizzy because he is coughing so much.

## 2021-08-16 ENCOUNTER — Inpatient Hospital Stay (HOSPITAL_COMMUNITY)
Admit: 2021-08-16 | Discharge: 2021-08-16 | Disposition: A | Discharge status: Home / Self Care | Payer: Medicaid Other | Attending: Internal Medicine | Admitting: Internal Medicine

## 2021-08-16 DIAGNOSIS — J441 Chronic obstructive pulmonary disease with (acute) exacerbation: Secondary | ICD-10-CM

## 2021-08-16 DIAGNOSIS — I5021 Acute systolic (congestive) heart failure: Secondary | ICD-10-CM

## 2021-08-16 DIAGNOSIS — Z794 Long term (current) use of insulin: Secondary | ICD-10-CM

## 2021-08-16 DIAGNOSIS — I5033 Acute on chronic diastolic (congestive) heart failure: Secondary | ICD-10-CM | POA: Diagnosis not present

## 2021-08-16 DIAGNOSIS — E1165 Type 2 diabetes mellitus with hyperglycemia: Secondary | ICD-10-CM | POA: Diagnosis not present

## 2021-08-16 LAB — BASIC METABOLIC PANEL
Anion gap: 8 (ref 5–15)
BUN: 21 mg/dL (ref 8–23)
CO2: 34 mmol/L — ABNORMAL HIGH (ref 22–32)
Calcium: 8.4 mg/dL — ABNORMAL LOW (ref 8.9–10.3)
Chloride: 92 mmol/L — ABNORMAL LOW (ref 98–111)
Creatinine, Ser: 0.93 mg/dL (ref 0.61–1.24)
GFR, Estimated: >60 mL/min (ref >60)
Glucose, Bld: 517 mg/dL (ref 70–99)
Potassium: 4.8 mmol/L (ref 3.5–5.1)
Sodium: 134 mmol/L — ABNORMAL LOW (ref 135–145)

## 2021-08-16 LAB — CBG MONITORING, ED
Glucose-Capillary: 506 mg/dL (ref 70–99)
Glucose-Capillary: 436 mg/dL — ABNORMAL HIGH (ref 70–99)
Glucose-Capillary: 321 mg/dL — ABNORMAL HIGH (ref 70–99)
Glucose-Capillary: 133 mg/dL — ABNORMAL HIGH (ref 70–99)

## 2021-08-16 LAB — CBC WITH DIFFERENTIAL/PLATELET
Abs Immature Granulocytes: 0.11 10*3/uL — ABNORMAL HIGH (ref 0.00–0.07)
Basophils Absolute: 0 10*3/uL (ref 0.0–0.1)
Basophils Relative: 0 %
Eosinophils Absolute: 0 10*3/uL (ref 0.0–0.5)
Eosinophils Relative: 0 %
HCT: 42.7 % (ref 39.0–52.0)
Hemoglobin: 13.3 g/dL (ref 13.0–17.0)
Immature Granulocytes: 1 %
Lymphocytes Relative: 3 %
Lymphs Abs: 0.3 10*3/uL — ABNORMAL LOW (ref 0.7–4.0)
MCH: 30 pg (ref 26.0–34.0)
MCHC: 31.1 g/dL (ref 30.0–36.0)
MCV: 96.2 fL (ref 80.0–100.0)
Monocytes Absolute: 0.1 10*3/uL (ref 0.1–1.0)
Monocytes Relative: 1 %
Neutro Abs: 9.6 10*3/uL — ABNORMAL HIGH (ref 1.7–7.7)
Neutrophils Relative %: 95 %
Platelets: 155 10*3/uL (ref 150–400)
RBC: 4.44 MIL/uL (ref 4.22–5.81)
RDW: 13.7 % (ref 11.5–15.5)
WBC: 10.1 10*3/uL (ref 4.0–10.5)
nRBC: 0 % (ref 0.0–0.2)

## 2021-08-16 LAB — HEMOGLOBIN A1C
Hgb A1c MFr Bld: 13 % — ABNORMAL HIGH (ref 4.8–5.6)
Mean Plasma Glucose: 326 mg/dL

## 2021-08-16 LAB — ECHOCARDIOGRAM COMPLETE
AR max vel: 3.07 cm2
AV Area VTI: 2.71 cm2
AV Area mean vel: 2.83 cm2
AV Mean grad: 7 mmHg
AV Peak grad: 12.4 mmHg
Ao pk vel: 1.76 m/s
Area-P 1/2: 3.4 cm2
Height: 73 in
MV VTI: 3.49 cm2
S' Lateral: 2.8 cm
Weight: 5040 oz

## 2021-08-16 LAB — BRAIN NATRIURETIC PEPTIDE: B Natriuretic Peptide: 167.8 pg/mL — ABNORMAL HIGH (ref 0.0–100.0)

## 2021-08-16 LAB — HIV ANTIBODY (ROUTINE TESTING W REFLEX): HIV Screen 4th Generation wRfx: NONREACTIVE

## 2021-08-16 LAB — TROPONIN I (HIGH SENSITIVITY)
Troponin I (High Sensitivity): 102 ng/L (ref <18)
Troponin I (High Sensitivity): 86 ng/L — ABNORMAL HIGH (ref <18)

## 2021-08-16 MED ORDER — HYDRALAZINE HCL 20 MG/ML IJ SOLN
10.0000 mg | INTRAMUSCULAR | Status: DC | PRN
  Administered 2021-08-16: 05:00:00 10 mg via INTRAVENOUS
  Filled 2021-08-16: qty 1

## 2021-08-16 MED ORDER — IPRATROPIUM-ALBUTEROL 0.5-2.5 (3) MG/3ML IN SOLN
3.0000 mL | Freq: Four times a day (QID) | RESPIRATORY_TRACT | Status: DC
  Administered 2021-08-16 – 2021-08-18 (×7): 3 mL via RESPIRATORY_TRACT
  Filled 2021-08-16 (×7): qty 3

## 2021-08-16 MED ORDER — INSULIN ASPART 100 UNIT/ML IJ SOLN
0.0000 [IU] | Freq: Every day | INTRAMUSCULAR | Status: DC
  Administered 2021-08-17: 23:00:00 2 [IU] via SUBCUTANEOUS
  Filled 2021-08-16: qty 1

## 2021-08-16 MED ORDER — INSULIN GLARGINE-YFGN 100 UNIT/ML ~~LOC~~ SOLN
40.0000 [IU] | Freq: Every day | SUBCUTANEOUS | Status: DC
  Administered 2021-08-16: 11:00:00 40 [IU] via SUBCUTANEOUS
  Filled 2021-08-16: qty 0.4

## 2021-08-16 MED ORDER — INSULIN GLARGINE-YFGN 100 UNIT/ML ~~LOC~~ SOLN
50.0000 [IU] | Freq: Every day | SUBCUTANEOUS | Status: DC
  Administered 2021-08-17 – 2021-08-18 (×2): 50 [IU] via SUBCUTANEOUS
  Filled 2021-08-16 (×3): qty 0.5

## 2021-08-16 MED ORDER — METHYLPREDNISOLONE SODIUM SUCC 40 MG IJ SOLR
40.0000 mg | Freq: Every evening | INTRAMUSCULAR | Status: DC
  Administered 2021-08-16 – 2021-08-17 (×2): 40 mg via INTRAVENOUS
  Filled 2021-08-16 (×2): qty 1

## 2021-08-16 MED ORDER — LISINOPRIL 10 MG PO TABS
40.0000 mg | ORAL_TABLET | Freq: Every day | ORAL | Status: DC
  Administered 2021-08-16 – 2021-08-17 (×2): 40 mg via ORAL
  Filled 2021-08-16 (×2): qty 4

## 2021-08-16 MED ORDER — INSULIN ASPART 100 UNIT/ML IJ SOLN
5.0000 [IU] | Freq: Three times a day (TID) | INTRAMUSCULAR | Status: DC
  Administered 2021-08-16 – 2021-08-18 (×7): 5 [IU] via SUBCUTANEOUS
  Filled 2021-08-16 (×7): qty 1

## 2021-08-16 MED ORDER — INSULIN ASPART 100 UNIT/ML IJ SOLN
0.0000 [IU] | Freq: Three times a day (TID) | INTRAMUSCULAR | Status: DC
  Administered 2021-08-16: 14:00:00 15 [IU] via SUBCUTANEOUS
  Administered 2021-08-16: 18:00:00 11 [IU] via SUBCUTANEOUS
  Administered 2021-08-17: 14:00:00 5 [IU] via SUBCUTANEOUS
  Administered 2021-08-17: 09:00:00 8 [IU] via SUBCUTANEOUS
  Administered 2021-08-17: 17:00:00 2 [IU] via SUBCUTANEOUS
  Administered 2021-08-18: 08:00:00 8 [IU] via SUBCUTANEOUS
  Filled 2021-08-16 (×6): qty 1

## 2021-08-16 MED ORDER — FUROSEMIDE 10 MG/ML IJ SOLN
40.0000 mg | Freq: Two times a day (BID) | INTRAMUSCULAR | Status: DC
  Administered 2021-08-16 – 2021-08-17 (×3): 40 mg via INTRAVENOUS
  Filled 2021-08-16 (×3): qty 4

## 2021-08-16 NOTE — Evaluation (Signed)
Physical Therapy Evaluation Patient Details Name: Cody Alexander MRN: [id number] DOB: 12-25-1955 Today's Date: 08/16/2021  History of Present Illness  Cody Alexander is a 65 y.o. male seen in ed with complaints of shortness of breath that is been going on for the past 2 days patient states that he has been using his oxygen.  He is on home oxygen at 2 L.  He was so short winded that he had to call rescue and come to the hospital as soon as possible EMS had given him DuoNeb and breathing treatments. PMH includes: COPD, OSA, HTN, CAD, HF, CHF, NSTEMI.   Clinical Impression  Patient received on stretcher in ED. He is reluctant to get up at this time. With some encouragement he performed supine to sit independently. Declined standing or OOB activity at this time due to "not feeling well". Patient will continue to benefit from skilled PT while here to improve strength, activity tolerance, and safety.           Recommendations for follow up therapy are one component of a multi-disciplinary discharge planning process, led by the attending physician.  Recommendations may be updated based on patient status, additional functional criteria and insurance authorization.  Follow Up Recommendations No PT follow up    Assistance Recommended at Discharge None  Functional Status Assessment Patient has had a recent decline in their functional status and demonstrates the ability to make significant improvements in function in a reasonable and predictable amount of time.  Equipment Recommendations  None recommended by PT    Recommendations for Other Services       Precautions / Restrictions Restrictions Weight Bearing Restrictions: No      Mobility  Bed Mobility Overal bed mobility: Independent                  Transfers                   General transfer comment: patient declined, states he is not feeling well and will do it tomorrow.    Ambulation/Gait                General Gait Details: patient declines walking with me, however has been up to bathroom in his room with nursing.  Stairs            Wheelchair Mobility    Modified Rankin (Stroke Patients Only)       Balance                                             Pertinent Vitals/Pain Pain Assessment: No/denies pain    Home Living Family/patient expects to be discharged to:: Private residence Living Arrangements: Alone   Type of Home: House Home Access: Stairs to enter   Secretary/administrator of Steps: 3     Home Equipment: Cane - single point      Prior Function Prior Level of Function : Independent/Modified Independent             Mobility Comments: uses cane as needed ADLs Comments: independent at baseline, drives, assists and 65 year old lady     Hand Dominance        Extremity/Trunk Assessment   Upper Extremity Assessment Upper Extremity Assessment: Overall WFL for tasks assessed    Lower Extremity Assessment Lower Extremity Assessment: Overall WFL for tasks assessed    Cervical /  Trunk Assessment Cervical / Trunk Assessment: Normal  Communication   Communication: No difficulties  Cognition Arousal/Alertness: Awake/alert Behavior During Therapy: WFL for tasks assessed/performed;Impulsive Overall Cognitive Status: Within Functional Limits for tasks assessed                                          General Comments      Exercises     Assessment/Plan    PT Assessment Patient needs continued PT services  PT Problem List Decreased mobility;Decreased activity tolerance;Cardiopulmonary status limiting activity;Decreased safety awareness       PT Treatment Interventions Therapeutic activities;Gait training;Therapeutic exercise;Functional mobility training;Stair training;Patient/family education    PT Goals (Current goals can be found in the Care Plan section)  Acute Rehab PT Goals Patient Stated Goal: to  return home, breathe better PT Goal Formulation: With patient Time For Goal Achievement: 08/30/21 Potential to Achieve Goals: Fair    Frequency Min 2X/week   Barriers to discharge        Co-evaluation               AM-PAC PT "6 Clicks" Mobility  Outcome Measure Help needed turning from your back to your side while in a flat bed without using bedrails?: None Help needed moving from lying on your back to sitting on the side of a flat bed without using bedrails?: None Help needed moving to and from a bed to a chair (including a wheelchair)?: A Little Help needed standing up from a chair using your arms (e.g., wheelchair or bedside chair)?: A Little Help needed to walk in hospital room?: A Little Help needed climbing 3-5 steps with a railing? : A Little 6 Click Score: 20    End of Session Equipment Utilized During Treatment: Oxygen Activity Tolerance: Other (comment) (limited to not feeling well) Patient left: in bed Nurse Communication: Mobility status;Other (comment) (patient asking for chaplain) PT Visit Diagnosis: Other abnormalities of gait and mobility (R26.89);Difficulty in walking, not elsewhere classified (R26.2)    Time: 6962-9528 PT Time Calculation (min) (ACUTE ONLY): 11 min   Charges:   PT Evaluation $PT Eval Low Complexity: 1 Low          Jia Mohamed, PT, GCS 08/16/21,12:51 PM

## 2021-08-16 NOTE — ED Notes (Signed)
Pt. States he needs to stand to urinate, this RN suggests external catheter for pt. Safety and comfort. Pt. Refused. Pt. Taking off [name] to stand and urinate, and sats drop, this RN encouraging pt. To keep oxygen on, and explains importance of keeping O2 sat up. Pt. Refuses.

## 2021-08-16 NOTE — ED Notes (Signed)
Pt. Continues to repeatedly stand for urination, taking off oxygen this RN repeatedly suggests external catheter for pt. Safety and comfort. Pt. Refused. MD notified.

## 2021-08-16 NOTE — Progress Notes (Signed)
*  PRELIMINARY RESULTS* Echocardiogram 2D Echocardiogram has been performed.  Cristela Blue 08/16/2021, 1:35 PM

## 2021-08-16 NOTE — ED Notes (Signed)
Chaplain called per pt request.  

## 2021-08-16 NOTE — ED Notes (Signed)
Pt ambulated to the commode with standby assistance.

## 2021-08-16 NOTE — Progress Notes (Signed)
°   08/16/21 1200  Clinical Encounter Type  Visited With Patient  Visit Type Initial;Spiritual support  Referral From Family  Consult/Referral To Chaplain  Spiritual Encounters  Spiritual Needs Prayer;Emotional   Chaplain visited room ED-12 Pt, Cody Alexander requested a wellness prayer. Chaplain provided prayer, spiritual and emotional support.

## 2021-08-16 NOTE — Progress Notes (Signed)
Patient ID: Cody Alexander, male   DOB: August 19, 1956, 65 y.o.   MRN: [id number] [facility] PROGRESS NOTE  Rizwan Kuyper [id number]:[id number] DOB: 08/09/1956 DOA: 08/15/2021 PCP: Mckinley Jewel, FNP  HPI/Subjective: Patient feeling well.  Some shortness of breath cough and wheeze.  At times coughing up some blood.  He chronically wears 2 L of oxygen and was up to 6 L this morning.  Objective: Vitals:   08/16/21 0910 08/16/21 1200  BP: (!) 160/102 114/83  Pulse: 81 86  Resp:  16  Temp:    SpO2:  96%    Intake/Output Summary (Last 24 hours) at 08/16/2021 1251 Last data filed at 08/16/2021 1105 Gross per 24 hour  Intake --  Output 4725 ml  Net -4725 ml   Filed Weights   08/15/21 1851  Weight: (!) 142.9 kg    ROS: Review of Systems  Respiratory:  Positive for cough, shortness of breath and wheezing.   Cardiovascular:  Positive for chest pain.  Gastrointestinal:  Positive for abdominal pain. Negative for nausea and vomiting.  Exam: Physical Exam HENT:     Head: Normocephalic.     Mouth/Throat:     Pharynx: No oropharyngeal exudate.  Eyes:     General: Lids are normal.     Conjunctiva/sclera: Conjunctivae normal.  Cardiovascular:     Rate and Rhythm: Normal rate and regular rhythm.     Heart sounds: Normal heart sounds, S1 normal and S2 normal.  Pulmonary:     Breath sounds: Examination of the right-middle field reveals decreased breath sounds and wheezing. Examination of the left-middle field reveals decreased breath sounds and wheezing. Examination of the right-lower field reveals decreased breath sounds and rhonchi. Examination of the left-lower field reveals decreased breath sounds and rhonchi. Decreased breath sounds, wheezing and rhonchi present. No rales.  Abdominal:     Palpations: Abdomen is soft.     Tenderness: There is no abdominal tenderness.     Hernia: A hernia is present. Hernia is present in the umbilical area.  Musculoskeletal:     Right lower  leg: Swelling present.     Left lower leg: Swelling present.  Skin:    General: Skin is warm.     Comments: Calluses bilateral feet, lipoma or cyst on back 2 of them.  Neurological:     Mental Status: He is alert and oriented to person, place, and time.      Scheduled Meds:  aspirin EC  81 mg Oral Daily   fluticasone furoate-vilanterol  1 puff Inhalation Daily   furosemide  40 mg Intravenous BID   heparin  5,000 Units Subcutaneous Q8H   insulin aspart  0-9 Units Subcutaneous TID WC   insulin aspart  5 Units Subcutaneous TID WC   [START ON 08/17/2021] insulin glargine-yfgn  50 Units Subcutaneous Daily   isosorbide dinitrate  30 mg Oral q morning   lisinopril  40 mg Oral Daily   metoprolol tartrate  25 mg Oral BID   sodium chloride flush  3 mL Intravenous Q12H   Continuous Infusions:  sodium chloride      Assessment/Plan:  Acute on chronic diastolic congestive heart failure.  Continue Lasix 40 mg IV twice daily, increase lisinopril to 40 mg daily on metoprolol tartrate 25 mg twice daily.  Fluid restriction.  Daily weights COPD exacerbation.  Continue nebulizer treatments for right now.  With sugars being very elevated and like to get her sugars under control prior to starting steroids.  Hopefully can start steroids  this evening. Type 2 diabetes mellitus with hyperglycemia.  Unfortunately Lantus was not given last night I have to give this morning.  Sugars above 500.  On short acting insulin plus sliding scale.  Hemoglobin A1c pending Morbid obesity with a BMI of 41.56 Essential hypertension blood pressure elevated this morning increase lisinopril to 40 mg daily on metoprolol also.     Code Status:     Code Status Orders  (From admission, onward)           Start     Ordered   08/15/21 2159  Full code  Continuous        08/15/21 2206           Code Status History     Date Active Date Inactive Code Status Order ID Comments User Context   01/04/2021 0347 01/15/2021  1939 Full Code [id number]  Andris Baumann, MD ED   06/20/2020 1959 06/25/2020 0032 Full Code [id number]  Rometta Emery, MD ED   02/25/2018 1608 02/28/2018 0724 Full Code [id number]  Katha Hamming, MD ED      Family Communication: Phone number in chart not correct. Disposition Plan: Status is: Inpatient  [employer]

## 2021-08-17 DIAGNOSIS — I5031 Acute diastolic (congestive) heart failure: Secondary | ICD-10-CM | POA: Diagnosis not present

## 2021-08-17 DIAGNOSIS — I10 Essential (primary) hypertension: Secondary | ICD-10-CM | POA: Diagnosis not present

## 2021-08-17 DIAGNOSIS — J441 Chronic obstructive pulmonary disease with (acute) exacerbation: Secondary | ICD-10-CM | POA: Diagnosis not present

## 2021-08-17 DIAGNOSIS — E1165 Type 2 diabetes mellitus with hyperglycemia: Secondary | ICD-10-CM | POA: Diagnosis not present

## 2021-08-17 LAB — BASIC METABOLIC PANEL
Anion gap: 6 (ref 5–15)
BUN: 27 mg/dL — ABNORMAL HIGH (ref 8–23)
CO2: 38 mmol/L — ABNORMAL HIGH (ref 22–32)
Calcium: 8.7 mg/dL — ABNORMAL LOW (ref 8.9–10.3)
Chloride: 93 mmol/L — ABNORMAL LOW (ref 98–111)
Creatinine, Ser: 0.87 mg/dL (ref 0.61–1.24)
GFR, Estimated: >60 mL/min (ref >60)
Glucose, Bld: 306 mg/dL — ABNORMAL HIGH (ref 70–99)
Potassium: 4.6 mmol/L (ref 3.5–5.1)
Sodium: 137 mmol/L (ref 135–145)

## 2021-08-17 LAB — CBG MONITORING, ED
Glucose-Capillary: 282 mg/dL — ABNORMAL HIGH (ref 70–99)
Glucose-Capillary: 209 mg/dL — ABNORMAL HIGH (ref 70–99)
Glucose-Capillary: 221 mg/dL — ABNORMAL HIGH (ref 70–99)

## 2021-08-17 MED ORDER — DOXYCYCLINE HYCLATE 100 MG PO TABS
100.0000 mg | ORAL_TABLET | Freq: Two times a day (BID) | ORAL | Status: DC
  Administered 2021-08-17 – 2021-08-18 (×3): 100 mg via ORAL
  Filled 2021-08-17 (×3): qty 1

## 2021-08-17 MED ORDER — LISINOPRIL 10 MG PO TABS
20.0000 mg | ORAL_TABLET | Freq: Every day | ORAL | Status: DC
  Administered 2021-08-18: 10:00:00 20 mg via ORAL
  Filled 2021-08-17: qty 2

## 2021-08-17 MED ORDER — FUROSEMIDE 40 MG PO TABS
80.0000 mg | ORAL_TABLET | Freq: Two times a day (BID) | ORAL | Status: DC
  Administered 2021-08-17 – 2021-08-18 (×2): 80 mg via ORAL
  Filled 2021-08-17 (×2): qty 2

## 2021-08-17 MED ORDER — HYDROCORTISONE 1 % EX CREA
TOPICAL_CREAM | Freq: Two times a day (BID) | CUTANEOUS | Status: DC
  Filled 2021-08-17: qty 28

## 2021-08-17 NOTE — Progress Notes (Addendum)
Inpatient Diabetes Program Recommendations  AACE/ADA: New Consensus Statement on Inpatient Glycemic Control (2015)  Target Ranges:  Prepandial:   less than 140 mg/dL      Peak postprandial:   less than 180 mg/dL (1-2 hours)      Critically ill patients:  140 - 180 mg/dL   Lab Results  Component Value Date   GLUCAP 282 (H) 08/17/2021   HGBA1C 13.0 (H) 08/16/2021    Review of Glycemic Control  Latest Reference Range & Units 08/16/21 07:47 08/16/21 11:53 08/16/21 17:22 08/16/21 21:58 08/17/21 07:46  Glucose-Capillary 70 - 99 mg/dL 264 (HH) 158 (H) 309 (H) 133 (H) 282 (H)   Diabetes history: DM 2 Outpatient Diabetes medications: Humalog 16 units tid, Lantus 50 units qhs, Metformin 1000 mg bid Current orders for Inpatient glycemic control:  Semglee 50 units Daily Novolog 0-15 units tid + hs Novolog 5 units tid meal coverage  Solumedrol 40 mg qpm  Inpatient Diabetes Program Recommendations:    Watch on current regimen. Semglee titrated this am.  Note A1c 13% on 12/22 pt with 4 recent COPD ED/hospitalizations since the beginning of November with steroid treatment and home prednisone tapers.  Pt to call MD for insulin adjustments while on steroids when his glucose trends increase (sick day guidelines while on steroids). Education attached to AVS.  Thanks,  Christena Deem RN, MSN, BC-ADM Inpatient Diabetes Coordinator Team Pager [phone] (8a-5p)

## 2021-08-17 NOTE — Progress Notes (Signed)
Patient ID: Cody Alexander, male   DOB: 1956/04/19, 65 y.o.   MRN: [id number] [facility] PROGRESS NOTE  Cody Alexander [id number]:[id number] DOB: 07/23/1956 DOA: 08/15/2021 PCP: Mckinley Jewel, FNP  HPI/Subjective: Patient short of breath and coughing.  Feels better than yesterday.  Patient asking for more water.  Admitted with CHF and COPD exacerbation.  Patient complain of some stuttering and some left arm tremor.  Objective: Vitals:   08/17/21 1000 08/17/21 1400  BP: 106/75 116/70  Pulse: 61 60  Resp: 20 18  Temp:    SpO2: 97% 100%    Intake/Output Summary (Last 24 hours) at 08/17/2021 1423 Last data filed at 08/16/2021 1957 Gross per 24 hour  Intake --  Output 1300 ml  Net -1300 ml   Filed Weights   08/15/21 1851  Weight: (!) 142.9 kg    ROS: Review of Systems  Respiratory:  Positive for cough, shortness of breath and wheezing.   Cardiovascular:  Negative for chest pain.  Gastrointestinal:  Negative for abdominal pain.  Exam: Physical Exam HENT:     Head: Normocephalic.     Mouth/Throat:     Pharynx: No oropharyngeal exudate.  Eyes:     General: Lids are normal.     Conjunctiva/sclera: Conjunctivae normal.  Cardiovascular:     Rate and Rhythm: Normal rate and regular rhythm.     Heart sounds: Normal heart sounds, S1 normal and S2 normal.  Pulmonary:     Breath sounds: Transmitted upper airway sounds present. Examination of the right-lower field reveals decreased breath sounds and wheezing. Examination of the left-lower field reveals decreased breath sounds and wheezing. Decreased breath sounds and wheezing present. No rhonchi or rales.  Abdominal:     Palpations: Abdomen is soft.     Tenderness: There is no abdominal tenderness.  Musculoskeletal:     Right lower leg: No swelling.     Left lower leg: No swelling.  Skin:    General: Skin is warm.     Findings: No rash.  Neurological:     Mental Status: He is alert and oriented to person, place, and  time.      Scheduled Meds:  aspirin EC  81 mg Oral Daily   fluticasone furoate-vilanterol  1 puff Inhalation Daily   furosemide  80 mg Oral BID   heparin  5,000 Units Subcutaneous Q8H   hydrocortisone cream   Topical BID   insulin aspart  0-15 Units Subcutaneous TID WC   insulin aspart  0-5 Units Subcutaneous QHS   insulin aspart  5 Units Subcutaneous TID WC   insulin glargine-yfgn  50 Units Subcutaneous Daily   ipratropium-albuterol  3 mL Nebulization Q6H   isosorbide dinitrate  30 mg Oral q morning   lisinopril  40 mg Oral Daily   methylPREDNISolone (SOLU-MEDROL) injection  40 mg Intravenous QPM   metoprolol tartrate  25 mg Oral BID   sodium chloride flush  3 mL Intravenous Q12H   Continuous Infusions:  sodium chloride      Assessment/Plan:  COPD exacerbation.  Continue Solu-Medrol nightly dosing.  Continue nebulizer treatments.  Add doxycycline. Acute on chronic diastolic congestive heart failure.  With CO2 rising and BUN starting to creep up I will change Lasix over to p.o. Type 2 diabetes mellitus with hyperglycemia.  Increased Semglee insulin to 50 units in the morning.  Short acting insulin plus sliding scale prior to meals.  Hemoglobin A1c very elevated at 13.0. Morbid obesity with a BMI of 41.56 Essential hypertension.  Blood pressure a little lower this morning.  Will decrease lisinopril down to usual dose.  Continue metoprolol also.        Code Status:     Code Status Orders  (From admission, onward)           Start     Ordered   08/15/21 2159  Full code  Continuous        08/15/21 2206           Code Status History     Date Active Date Inactive Code Status Order ID Comments User Context   01/04/2021 0347 01/15/2021 1939 Full Code [id number]  Andris Baumann, MD ED   06/20/2020 1959 06/25/2020 0032 Full Code [id number]  Rometta Emery, MD ED   02/25/2018 1608 02/28/2018 0724 Full Code [id number]  Katha Hamming, MD ED      Disposition Plan:  Status is: Inpatient  [facility]  [facility]

## 2021-08-17 NOTE — ED Notes (Signed)
Pt asking for another cup of coffee explain to pt that he was on a fluid restriction to a day and he needed to spread out his drinks or he would not be able to to drink anything for the rest of the day. Pt states he wanted to speak to the doctor in regards to his. This RN states that he can bring his concerns to the doctor when he makes his rounds this AM.

## 2021-08-17 NOTE — ED Notes (Signed)
Pt given a sandwich tray ?

## 2021-08-17 NOTE — ED Notes (Signed)
Informed RN bed assigned 

## 2021-08-17 NOTE — ED Notes (Signed)
Pt up out of bed at this time, instructed that he needs to keep the O2 on his nose even if he gets up.

## 2021-08-18 DIAGNOSIS — J9611 Chronic respiratory failure with hypoxia: Secondary | ICD-10-CM | POA: Diagnosis not present

## 2021-08-18 DIAGNOSIS — E875 Hyperkalemia: Secondary | ICD-10-CM | POA: Insufficient documentation

## 2021-08-18 DIAGNOSIS — J441 Chronic obstructive pulmonary disease with (acute) exacerbation: Secondary | ICD-10-CM | POA: Diagnosis not present

## 2021-08-18 DIAGNOSIS — E1165 Type 2 diabetes mellitus with hyperglycemia: Secondary | ICD-10-CM | POA: Diagnosis not present

## 2021-08-18 DIAGNOSIS — I5033 Acute on chronic diastolic (congestive) heart failure: Secondary | ICD-10-CM | POA: Diagnosis not present

## 2021-08-18 LAB — BASIC METABOLIC PANEL
Anion gap: 5 (ref 5–15)
BUN: 25 mg/dL — ABNORMAL HIGH (ref 8–23)
CO2: 40 mmol/L — ABNORMAL HIGH (ref 22–32)
Calcium: 8.7 mg/dL — ABNORMAL LOW (ref 8.9–10.3)
Chloride: 89 mmol/L — ABNORMAL LOW (ref 98–111)
Creatinine, Ser: 0.92 mg/dL (ref 0.61–1.24)
GFR, Estimated: >60 mL/min (ref >60)
Glucose, Bld: 290 mg/dL — ABNORMAL HIGH (ref 70–99)
Potassium: 5.4 mmol/L — ABNORMAL HIGH (ref 3.5–5.1)
Sodium: 134 mmol/L — ABNORMAL LOW (ref 135–145)

## 2021-08-18 LAB — CBG MONITORING, ED: Glucose-Capillary: 291 mg/dL — ABNORMAL HIGH (ref 70–99)

## 2021-08-18 MED ORDER — PATIROMER SORBITEX CALCIUM 8.4 G PO PACK
25.2000 g | PACK | ORAL | Status: AC
  Administered 2021-08-18: 15:00:00 25.2 g via ORAL
  Filled 2021-08-18: qty 3

## 2021-08-18 MED ORDER — HYDROCOD POLST-CPM POLST ER 10-8 MG/5ML PO SUER: 5.0000 mL | Freq: Two times a day (BID) | ORAL | 0 refills | Status: DC | PRN

## 2021-08-18 MED ORDER — IPRATROPIUM-ALBUTEROL 0.5-2.5 (3) MG/3ML IN SOLN: 3.0000 mL | Freq: Four times a day (QID) | RESPIRATORY_TRACT | 0 refills | Status: DC | PRN

## 2021-08-18 MED ORDER — PREDNISONE 10 MG PO TABS: ORAL_TABLET | ORAL | 0 refills | Status: DC

## 2021-08-18 MED ORDER — INSULIN LISPRO 100 UNIT/ML IJ SOLN: 16.0000 [IU] | Freq: Three times a day (TID) | INTRAMUSCULAR | 0 refills | Status: DC

## 2021-08-18 MED ORDER — LANTUS 100 UNIT/ML ~~LOC~~ SOLN: 50.0000 [IU] | Freq: Every day | SUBCUTANEOUS | 0 refills | Status: DC

## 2021-08-18 MED ORDER — DOXYCYCLINE HYCLATE 100 MG PO TABS
100.0000 mg | ORAL_TABLET | Freq: Two times a day (BID) | ORAL | 0 refills | Status: AC
Start: 2021-08-18 — End: 2021-08-23

## 2021-08-18 MED ORDER — PROAIR HFA 108 (90 BASE) MCG/ACT IN AERS: 2.0000 | INHALATION_SPRAY | RESPIRATORY_TRACT | 0 refills | Status: DC | PRN

## 2021-08-18 NOTE — TOC Progression Note (Signed)
Transition of Care [facility]) - Progression Note    Patient Details  Name: Cody Alexander MRN: [id number] Date of Birth: 1955-12-18  [facility] of [facility]) CM/SW Contact  Maree Krabbe, LCSW Phone Number: 08/18/2021, 2:35 PM  Clinical Narrative:   CSW unable to obtain [facility] for this pt due to insurance, will have to order outpatient PT at d/c.         Expected Discharge Plan and Services           Expected Discharge Date: 08/18/21                                     Social Determinants of Health (SDOH) Interventions    Readmission Risk Interventions No flowsheet data found.

## 2021-08-18 NOTE — Discharge Summary (Signed)
[facility] [place] [facility]   [name]    MR#:  [id number]  DATE OF BIRTH:  Jun 02, 1956  DATE OF ADMISSION:  08/15/2021 ADMITTING PHYSICIAN: Gertha Calkin, MD  DATE OF DISCHARGE: 08/18/2021  PRIMARY CARE PHYSICIAN: Mckinley Jewel, FNP    ADMISSION DIAGNOSIS:  Acute CHF (congestive heart failure) (HCC) [I50.9]  DISCHARGE DIAGNOSIS:  COPD exacerbation Acute on chronic diastolic congestive heart failure Type 2 diabetes mellitus with hyperglycemia Morbid obesity Essential hypertension  SECONDARY DIAGNOSIS:   Past Medical History:  Diagnosis Date   CHF (congestive heart failure) (HCC)    COPD (chronic obstructive pulmonary disease) (HCC)    Diabetes mellitus without complication (HCC)    Hypertension    Neuropathy     HOSPITAL COURSE:   1.  COPD exacerbation.  I saw the patient on 08/16/2021 and felt that this was likely more COPD exacerbation and I needed to control his sugars a little bit better before starting steroids.  Lungs are clear upon disposition.  Will prescribe prednisone 40 mg for 3 more days.  Doxycycline will be given upon disposition.  Patient wanted a prescription for Tussionex. 2.  Acute on chronic diastolic congestive heart failure.  Initially was on IV Lasix.  With CO2 rising BUN starting to creep up I change Lasix over to p.o. 3.  Chronic hypoxic respiratory failure on 3 L of oxygen 4.  Type 2 diabetes mellitus with hyperglycemia.  Patient sugars were elevated initially because they did not prescribe his insulin at night.  I started his insulin during the day and improve the sugars then I started steroids which elevated the sugars.  His hemoglobin A1c is very elevated at 13.0.  I renewed both his short acting and long-acting insulin 5.  Morbid obesity with a BMI of 41.56 6.  Essential hypertension.  Holding lisinopril today with hyperkalemia.  Continue metoprolol and Norvasc. 7.  Hyperkalemia today.   Potassium 5.4 today.  1 dose of Veltassa.  Hold lisinopril.  Continue Lasix. recheck BMP as outpatient.  DISCHARGE CONDITIONS:   Satisfactory  CONSULTS OBTAINED:  None  DRUG ALLERGIES:   Allergies  Allergen Reactions   Erythromycin Anaphylaxis and Swelling    Had eye swelling with erythromycin during a time when he had perf ear drum.  Tolerated azithromycin.    DISCHARGE MEDICATIONS:   Allergies as of 08/18/2021       Reactions   Erythromycin Anaphylaxis, Swelling   Had eye swelling with erythromycin during a time when he had perf ear drum. Tolerated azithromycin.        Medication List     STOP taking these medications    ketoconazole 2 % cream Commonly known as: NIZORAL   lisinopril 20 MG tablet Commonly known as: ZESTRIL       TAKE these medications    acetaminophen 500 MG tablet Commonly known as: TYLENOL Take 1,000 mg by mouth every 6 (six) hours as needed.   amLODipine 10 MG tablet Commonly known as: NORVASC Take 1 tablet by mouth daily.   aspirin EC 81 MG tablet Take 81 mg by mouth daily. Swallow whole.   atorvastatin 40 MG tablet Commonly known as: LIPITOR Take 1 tablet by mouth daily.   Atrovent HFA 17 MCG/ACT inhaler Generic drug: ipratropium Inhale 1 puff into the lungs every 6 (six) hours as needed.   chlorpheniramine-HYDROcodone 10-8 MG/5ML Suer Commonly known as: TUSSIONEX Take 5 mLs by mouth every 12 (twelve) hours as needed for  cough. What changed:  when to take this reasons to take this   doxycycline 100 MG tablet Commonly known as: VIBRA-TABS Take 1 tablet (100 mg total) by mouth every 12 (twelve) hours for 5 days.   furosemide 80 MG tablet Commonly known as: LASIX Take 160 mg by mouth 2 (two) times daily.   insulin lispro 100 UNIT/ML injection Commonly known as: HUMALOG Inject 0.16 mLs (16 Units total) into the skin in the morning, at noon, and at bedtime.   ipratropium-albuterol 0.5-2.5 (3) MG/3ML Soln Commonly  known as: DUONEB Inhale 3 mLs into the lungs every 6 (six) hours as needed. What changed: when to take this   isosorbide dinitrate 30 MG tablet Commonly known as: ISORDIL Take 30 mg by mouth every morning.   Lantus 100 UNIT/ML injection Generic drug: insulin glargine Inject 0.5 mLs (50 Units total) into the skin daily. What changed: when to take this   metFORMIN 1000 MG tablet Commonly known as: GLUCOPHAGE Take 1,000 mg by mouth 2 (two) times daily.   metoprolol tartrate 25 MG tablet Commonly known as: LOPRESSOR Take 25 mg by mouth 2 (two) times daily.   predniSONE 10 MG tablet Commonly known as: DELTASONE 4 tabs po in evening for three days   ProAir HFA 108 (90 Base) MCG/ACT inhaler Generic drug: albuterol Inhale 2 puffs into the lungs every 4 (four) hours as needed for wheezing. What changed: Another medication with the same name was removed. Continue taking this medication, and follow the directions you see here.   Symbicort 160-4.5 MCG/ACT inhaler Generic drug: budesonide-formoterol Inhale 2 puffs into the lungs 2 (two) times daily.         DISCHARGE INSTRUCTIONS:   Follow-up PMD 5 days Follow-up CHF clinic  If you experience worsening of your admission symptoms, develop shortness of breath, life threatening emergency, suicidal or homicidal thoughts you must seek medical attention immediately by calling 911 or calling your MD immediately  if symptoms less severe.  You Must read complete instructions/literature along with all the possible adverse reactions/side effects for all the Medicines you take and that have been prescribed to you. Take any new Medicines after you have completely understood and accept all the possible adverse reactions/side effects.   Please note  You were cared for by a hospitalist during your hospital stay. If you have any questions about your discharge medications or the care you received while you were in the hospital after you are  discharged, you can call the unit and asked to speak with the hospitalist on call if the hospitalist that took care of you is not available. Once you are discharged, your primary care physician will handle any further medical issues. Please note that NO REFILLS for any discharge medications will be authorized once you are discharged, as it is imperative that you return to your primary care physician (or establish a relationship with a primary care physician if you do not have one) for your aftercare needs so that they can reassess your need for medications and monitor your lab values.    Today   CHIEF COMPLAINT:   Chief Complaint  Patient presents with   Shortness of Breath    C/o SOB x2 days increasing in severity today. Medic reports O2 sat mid 80's. Pt. On 2L [place] at home baseline. Pt. Has not taken lasix x2 days.    HISTORY OF PRESENT ILLNESS:  Cody Alexander  is a 65 y.o. male came in with shortness of   VITAL  SIGNS:  Blood pressure (!) 138/92, pulse 68, temperature 98 F (36.7 C), temperature source Oral, resp. rate 19, height 6\' 1"  (1.854 m), weight (!) 142.9 kg, SpO2 100 %.  I/O:  No intake or output data in the 24 hours ending 08/18/21 1621  PHYSICAL EXAMINATION:  GENERAL:  65 y.o.-year-old patient lying in the bed with no acute distress.  EYES: Pupils equal, round, reactive to light and accommodation. No scleral icterus.  HEENT: Head atraumatic, normocephalic. Oropharynx and nasopharynx clear.  LUNGS: After coughing he has normal breath sounds bilaterally, no wheezing, rales,rhonchi or crepitation. No use of accessory muscles of respiration.  CARDIOVASCULAR: S1, S2 normal. No murmurs, rubs, or gallops.  ABDOMEN: Soft, non-tender, non-distended.  EXTREMITIES: No pedal edema.  NEUROLOGIC: Cranial nerves II through XII are intact. Muscle strength 5/5 in all extremities. Sensation intact. Gait not checked.  PSYCHIATRIC: The patient is alert and oriented x 3.  SKIN: No  obvious rash, lesion, or ulcer.   DATA REVIEW:   CBC Recent Labs  Lab 08/16/21 0110  WBC 10.1  HGB 13.3  HCT 42.7  PLT 155    Chemistries  Recent Labs  Lab 08/15/21 1905 08/16/21 0831 08/18/21 0730  NA 138   < > 134*  K 4.7   < > 5.4*  CL 98   < > 89*  CO2 34*   < > 40*  GLUCOSE 385*   < > 290*  BUN 15   < > 25*  CREATININE 0.77   < > 0.92  CALCIUM 8.3*   < > 8.7*  AST 28  --   --   ALT 37  --   --   ALKPHOS 44  --   --   BILITOT 0.9  --   --    < > = values in this interval not displayed.     Microbiology Results  Results for orders placed or performed during the hospital encounter of 08/15/21  Resp Panel by RT-PCR (Flu A&B, Covid) Nasopharyngeal Swab     Status: None   Collection Time: 08/15/21  7:05 PM   Specimen: Nasopharyngeal Swab; Nasopharyngeal(NP) swabs in vial transport medium  Result Value Ref Range Status   SARS Coronavirus 2 by RT PCR NEGATIVE NEGATIVE Final    Comment: (NOTE) SARS-CoV-2 target nucleic acids are NOT DETECTED.  The SARS-CoV-2 RNA is generally detectable in upper respiratory specimens during the acute phase of infection. The lowest concentration of SARS-CoV-2 viral copies this assay can detect is 138 copies/mL. A negative result does not preclude SARS-Cov-2 infection and should not be used as the sole basis for treatment or other patient management decisions. A negative result may occur with  improper specimen collection/handling, submission of specimen other than nasopharyngeal swab, presence of viral mutation(s) within the areas targeted by this assay, and inadequate number of viral copies(<138 copies/mL). A negative result must be combined with clinical observations, patient history, and epidemiological information. The expected result is Negative.  Fact Sheet for Patients:  08/17/21  Fact Sheet for Healthcare Providers:  BloggerCourse.com  This test is no t yet  approved or cleared by the [place] FDA and  has been authorized for detection and/or diagnosis of SARS-CoV-2 by FDA under an Emergency Use Authorization (EUA). This EUA will remain  in effect (meaning this test can be used) for the duration of the COVID-19 declaration under Section 564(b)(1) of the Act, 21 U.S.C.section 360bbb-3(b)(1), unless the authorization is terminated  or revoked sooner.  Influenza A by PCR NEGATIVE NEGATIVE Final   Influenza B by PCR NEGATIVE NEGATIVE Final    Comment: (NOTE) The Xpert Xpress SARS-CoV-2/FLU/RSV plus assay is intended as an aid in the diagnosis of influenza from Nasopharyngeal swab specimens and should not be used as a sole basis for treatment. Nasal washings and aspirates are unacceptable for Xpert Xpress SARS-CoV-2/FLU/RSV testing.  Fact Sheet for Patients: BloggerCourse.com  Fact Sheet for Healthcare Providers: SeriousBroker.it  This test is not yet approved or cleared by the [place] FDA and has been authorized for detection and/or diagnosis of SARS-CoV-2 by FDA under an Emergency Use Authorization (EUA). This EUA will remain in effect (meaning this test can be used) for the duration of the COVID-19 declaration under Section 564(b)(1) of the Act, 21 U.S.C. section 360bbb-3(b)(1), unless the authorization is terminated or revoked.  Performed at [facility], [address] [place], [place] [address]       Management plans discussed with the patient, family and they are in agreement.  CODE STATUS:     Code Status Orders  (From admission, onward)           Start     Ordered   08/15/21 2159  Full code  Continuous        08/15/21 2206           Code Status History     Date Active Date Inactive Code Status Order ID Comments User Context   01/04/2021 0347 01/15/2021 1939 Full Code [id number]  Andris Baumann, MD ED   06/20/2020 1959 06/25/2020  0032 Full Code [id number]  Rometta Emery, MD ED   02/25/2018 1608 02/28/2018 0724 Full Code [id number]  Katha Hamming, MD ED       TOTAL TIME TAKING CARE OF THIS PATIENT: 32 minutes.    Alford Highland M.D on 08/18/2021 at 4:21 PM   [facility]  CC: Primary care physician; Mckinley Jewel, FNP

## 2021-08-18 NOTE — ED Notes (Signed)
Pt resting comfortably in bed, NAD. No needs identified at this time. Bed low & locked; call light & personal items within reach. 

## 2021-08-18 NOTE — Discharge Instructions (Signed)
Hold lisinopril until seen by medical doctor and repeat labs (including potassium) done

## 2021-08-18 NOTE — ED Notes (Signed)
Informed RN bed assigned 

## 2021-08-20 LAB — CBG MONITORING, ED: Glucose-Capillary: 134 mg/dL — ABNORMAL HIGH (ref 70–99)

## 2021-09-04 NOTE — Progress Notes (Deleted)
°   Patient ID: Cody Alexander, male    DOB: 1955/11/05, 67 y.o.   MRN: [id number]  HPI  [name] is a 66 y/o male with a history of  Echo report from 08/16/21 reviewed and showed an EF of 60-65% along with moderate LVH  LHC done 02/27/18 and showed: Prox LAD lesion is 35% stenosed.   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 Noncardiac chest pain.  Presented to the ED 08/15/21 due to shortness of breath due to COPD/HF exacerbation. Prednisone and antibiotics given. Initially given IV lasix with transition to oral diuretics. Hyperkalemia, given 1 dose of valtessa. Discharged after 3 days. Admitted 2 other times in December. Had 1 admission and 1 ED visit November.   He presents today for his initial visit with a chief complaint of   Review of Systems    Physical Exam  Assessment & Plan:  1: Chronic heart failure with preserved ejection fraction- - NYHA class  - saw Cody Alexander cardiology 11/07/20 - BNP 08/16/21 was 167.8  2: HTN- - BP - BMP 08/18/21 reviewed and showed sodium 134, potassium 5.4, creatinine 0.92 and GFR >60  3: DM- - A1c 08/16/21 was 13%  4: COPD-

## 2021-09-05 ENCOUNTER — Telehealth: Payer: Self-pay | Admitting: Family

## 2021-09-05 ENCOUNTER — Ambulatory Visit: Payer: Medicaid Other | Admitting: Family

## 2021-09-05 NOTE — Telephone Encounter (Signed)
Patient did not show for his [facility] Failure [facility] appointment on 09/05/21. Will attempt to reschedule.   °

## 2022-01-27 ENCOUNTER — Emergency Department: Payer: Medicare Other

## 2022-01-27 ENCOUNTER — Inpatient Hospital Stay
Admission: EM | Admit: 2022-01-27 | Discharge: 2022-01-29 | DRG: 291 | Disposition: A | Discharge status: Home Health | Payer: Medicare Other | Attending: Obstetrics and Gynecology | Admitting: Obstetrics and Gynecology

## 2022-01-27 ENCOUNTER — Other Ambulatory Visit: Payer: Self-pay

## 2022-01-27 ENCOUNTER — Inpatient Hospital Stay: Payer: Medicare Other

## 2022-01-27 DIAGNOSIS — Z794 Long term (current) use of insulin: Secondary | ICD-10-CM | POA: Diagnosis not present

## 2022-01-27 DIAGNOSIS — R251 Tremor, unspecified: Secondary | ICD-10-CM | POA: Diagnosis present

## 2022-01-27 DIAGNOSIS — G4733 Obstructive sleep apnea (adult) (pediatric): Secondary | ICD-10-CM | POA: Diagnosis present

## 2022-01-27 DIAGNOSIS — I5033 Acute on chronic diastolic (congestive) heart failure: Secondary | ICD-10-CM | POA: Diagnosis present

## 2022-01-27 DIAGNOSIS — R278 Other lack of coordination: Secondary | ICD-10-CM | POA: Diagnosis present

## 2022-01-27 DIAGNOSIS — I2510 Atherosclerotic heart disease of native coronary artery without angina pectoris: Secondary | ICD-10-CM | POA: Diagnosis present

## 2022-01-27 DIAGNOSIS — Z6841 Body Mass Index (BMI) 40.0 and over, adult: Secondary | ICD-10-CM

## 2022-01-27 DIAGNOSIS — I5032 Chronic diastolic (congestive) heart failure: Secondary | ICD-10-CM | POA: Diagnosis present

## 2022-01-27 DIAGNOSIS — I252 Old myocardial infarction: Secondary | ICD-10-CM

## 2022-01-27 DIAGNOSIS — Z9181 History of falling: Secondary | ICD-10-CM

## 2022-01-27 DIAGNOSIS — I10 Essential (primary) hypertension: Secondary | ICD-10-CM | POA: Diagnosis present

## 2022-01-27 DIAGNOSIS — Z8249 Family history of ischemic heart disease and other diseases of the circulatory system: Secondary | ICD-10-CM

## 2022-01-27 DIAGNOSIS — J9621 Acute and chronic respiratory failure with hypoxia: Secondary | ICD-10-CM | POA: Diagnosis present

## 2022-01-27 DIAGNOSIS — J449 Chronic obstructive pulmonary disease, unspecified: Secondary | ICD-10-CM | POA: Diagnosis present

## 2022-01-27 DIAGNOSIS — Z823 Family history of stroke: Secondary | ICD-10-CM

## 2022-01-27 DIAGNOSIS — Z79899 Other long term (current) drug therapy: Secondary | ICD-10-CM | POA: Diagnosis not present

## 2022-01-27 DIAGNOSIS — Z7984 Long term (current) use of oral hypoglycemic drugs: Secondary | ICD-10-CM

## 2022-01-27 DIAGNOSIS — E785 Hyperlipidemia, unspecified: Secondary | ICD-10-CM | POA: Diagnosis present

## 2022-01-27 DIAGNOSIS — Z9981 Dependence on supplemental oxygen: Secondary | ICD-10-CM | POA: Diagnosis not present

## 2022-01-27 DIAGNOSIS — R778 Other specified abnormalities of plasma proteins: Secondary | ICD-10-CM | POA: Diagnosis present

## 2022-01-27 DIAGNOSIS — I110 Hypertensive heart disease with heart failure: Secondary | ICD-10-CM | POA: Diagnosis present

## 2022-01-27 DIAGNOSIS — E1165 Type 2 diabetes mellitus with hyperglycemia: Secondary | ICD-10-CM | POA: Diagnosis present

## 2022-01-27 DIAGNOSIS — Z1152 Encounter for screening for COVID-19: Secondary | ICD-10-CM | POA: Diagnosis not present

## 2022-01-27 DIAGNOSIS — W19XXXA Unspecified fall, initial encounter: Secondary | ICD-10-CM | POA: Diagnosis not present

## 2022-01-27 DIAGNOSIS — E66813 Obesity, class 3: Secondary | ICD-10-CM | POA: Diagnosis present

## 2022-01-27 DIAGNOSIS — E1140 Type 2 diabetes mellitus with diabetic neuropathy, unspecified: Secondary | ICD-10-CM | POA: Diagnosis present

## 2022-01-27 DIAGNOSIS — J441 Chronic obstructive pulmonary disease with (acute) exacerbation: Secondary | ICD-10-CM | POA: Diagnosis present

## 2022-01-27 DIAGNOSIS — R531 Weakness: Secondary | ICD-10-CM | POA: Diagnosis present

## 2022-01-27 DIAGNOSIS — Y92009 Unspecified place in unspecified non-institutional (private) residence as the place of occurrence of the external cause: Secondary | ICD-10-CM

## 2022-01-27 DIAGNOSIS — R7989 Other specified abnormal findings of blood chemistry: Secondary | ICD-10-CM | POA: Diagnosis present

## 2022-01-27 DIAGNOSIS — R262 Difficulty in walking, not elsewhere classified: Secondary | ICD-10-CM | POA: Diagnosis present

## 2022-01-27 DIAGNOSIS — Z7951 Long term (current) use of inhaled steroids: Secondary | ICD-10-CM

## 2022-01-27 DIAGNOSIS — Z7982 Long term (current) use of aspirin: Secondary | ICD-10-CM

## 2022-01-27 DIAGNOSIS — I509 Heart failure, unspecified: Principal | ICD-10-CM

## 2022-01-27 DIAGNOSIS — R29898 Other symptoms and signs involving the musculoskeletal system: Secondary | ICD-10-CM | POA: Diagnosis present

## 2022-01-27 DIAGNOSIS — R079 Chest pain, unspecified: Secondary | ICD-10-CM | POA: Diagnosis present

## 2022-01-27 DIAGNOSIS — Z881 Allergy status to other antibiotic agents status: Secondary | ICD-10-CM

## 2022-01-27 LAB — COMPREHENSIVE METABOLIC PANEL
ALT: 23 U/L (ref 0–44)
AST: 16 U/L (ref 15–41)
Albumin: 3.6 g/dL (ref 3.5–5.0)
Alkaline Phosphatase: 60 U/L (ref 38–126)
Anion gap: 1 — ABNORMAL LOW (ref 5–15)
BUN: 11 mg/dL (ref 8–23)
CO2: 36 mmol/L — ABNORMAL HIGH (ref 22–32)
Calcium: 8.6 mg/dL — ABNORMAL LOW (ref 8.9–10.3)
Chloride: 102 mmol/L (ref 98–111)
Creatinine, Ser: 0.83 mg/dL (ref 0.61–1.24)
GFR, Estimated: >60 mL/min (ref >60)
Glucose, Bld: 88 mg/dL (ref 70–99)
Potassium: 4.3 mmol/L (ref 3.5–5.1)
Sodium: 139 mmol/L (ref 135–145)
Total Bilirubin: 0.6 mg/dL (ref 0.3–1.2)
Total Protein: 7 g/dL (ref 6.5–8.1)

## 2022-01-27 LAB — CBC WITH DIFFERENTIAL/PLATELET
Abs Immature Granulocytes: 0.09 10*3/uL — ABNORMAL HIGH (ref 0.00–0.07)
Basophils Absolute: 0.1 10*3/uL (ref 0.0–0.1)
Basophils Relative: 1 %
Eosinophils Absolute: 0.2 10*3/uL (ref 0.0–0.5)
Eosinophils Relative: 3 %
HCT: 44.6 % (ref 39.0–52.0)
Hemoglobin: 13.4 g/dL (ref 13.0–17.0)
Immature Granulocytes: 1 %
Lymphocytes Relative: 16 %
Lymphs Abs: 1.3 10*3/uL (ref 0.7–4.0)
MCH: 28.8 pg (ref 26.0–34.0)
MCHC: 30 g/dL (ref 30.0–36.0)
MCV: 95.9 fL (ref 80.0–100.0)
Monocytes Absolute: 1 10*3/uL (ref 0.1–1.0)
Monocytes Relative: 12 %
Neutro Abs: 5.5 10*3/uL (ref 1.7–7.7)
Neutrophils Relative %: 67 %
Platelets: 193 10*3/uL (ref 150–400)
RBC: 4.65 MIL/uL (ref 4.22–5.81)
RDW: 13.6 % (ref 11.5–15.5)
WBC: 8.1 10*3/uL (ref 4.0–10.5)
nRBC: 0 % (ref 0.0–0.2)

## 2022-01-27 LAB — HEMOGLOBIN A1C
Hgb A1c MFr Bld: 10.2 % — ABNORMAL HIGH (ref 4.8–5.6)
Mean Plasma Glucose: 246.04 mg/dL

## 2022-01-27 LAB — GLUCOSE, CAPILLARY
Glucose-Capillary: 391 mg/dL — ABNORMAL HIGH (ref 70–99)
Glucose-Capillary: 410 mg/dL — ABNORMAL HIGH (ref 70–99)

## 2022-01-27 LAB — SARS CORONAVIRUS 2 BY RT PCR: SARS Coronavirus 2 by RT PCR: NEGATIVE

## 2022-01-27 LAB — VITAMIN B12: Vitamin B-12: 235 pg/mL (ref 180–914)

## 2022-01-27 LAB — MAGNESIUM: Magnesium: 2.3 mg/dL (ref 1.7–2.4)

## 2022-01-27 LAB — TROPONIN I (HIGH SENSITIVITY)
Troponin I (High Sensitivity): 108 ng/L (ref <18)
Troponin I (High Sensitivity): 106 ng/L (ref <18)
Troponin I (High Sensitivity): 110 ng/L (ref <18)

## 2022-01-27 LAB — CBG MONITORING, ED: Glucose-Capillary: 128 mg/dL — ABNORMAL HIGH (ref 70–99)

## 2022-01-27 LAB — BRAIN NATRIURETIC PEPTIDE: B Natriuretic Peptide: 50.9 pg/mL (ref 0.0–100.0)

## 2022-01-27 MED ORDER — INSULIN ASPART 100 UNIT/ML IJ SOLN
0.0000 [IU] | Freq: Every day | INTRAMUSCULAR | Status: DC
  Administered 2022-01-27: 5 [IU] via SUBCUTANEOUS
  Filled 2022-01-27 (×2): qty 1

## 2022-01-27 MED ORDER — ALBUTEROL SULFATE (2.5 MG/3ML) 0.083% IN NEBU: 2.5000 mg | INHALATION_SOLUTION | RESPIRATORY_TRACT | Status: DC | PRN

## 2022-01-27 MED ORDER — FUROSEMIDE 10 MG/ML IJ SOLN
40.0000 mg | Freq: Once | INTRAMUSCULAR | Status: AC
  Administered 2022-01-27: 40 mg via INTRAVENOUS
  Filled 2022-01-27: qty 4

## 2022-01-27 MED ORDER — ASPIRIN 81 MG PO TBEC: 81.0000 mg | DELAYED_RELEASE_TABLET | Freq: Every day | ORAL | Status: DC

## 2022-01-27 MED ORDER — GABAPENTIN 600 MG PO TABS
600.0000 mg | ORAL_TABLET | Freq: Three times a day (TID) | ORAL | Status: DC
Start: 2022-01-27 — End: 2022-01-28
  Administered 2022-01-27 (×2): 600 mg via ORAL
  Filled 2022-01-27 (×2): qty 1

## 2022-01-27 MED ORDER — FUROSEMIDE 10 MG/ML IJ SOLN
40.0000 mg | Freq: Once | INTRAMUSCULAR | Status: AC
Start: 2022-01-27 — End: 2022-01-27
  Administered 2022-01-27: 40 mg via INTRAVENOUS
  Filled 2022-01-27: qty 4

## 2022-01-27 MED ORDER — IPRATROPIUM-ALBUTEROL 0.5-2.5 (3) MG/3ML IN SOLN
3.0000 mL | RESPIRATORY_TRACT | Status: DC
  Administered 2022-01-27 – 2022-01-28 (×4): 3 mL via RESPIRATORY_TRACT
  Filled 2022-01-27: qty 6
  Filled 2022-01-27 (×3): qty 3

## 2022-01-27 MED ORDER — IPRATROPIUM-ALBUTEROL 0.5-2.5 (3) MG/3ML IN SOLN
3.0000 mL | Freq: Once | RESPIRATORY_TRACT | Status: AC
  Administered 2022-01-27: 3 mL via RESPIRATORY_TRACT
  Filled 2022-01-27: qty 3

## 2022-01-27 MED ORDER — FUROSEMIDE 10 MG/ML IJ SOLN: 60.0000 mg | Freq: Two times a day (BID) | INTRAMUSCULAR | Status: DC

## 2022-01-27 MED ORDER — ISOSORBIDE DINITRATE 30 MG PO TABS
30.0000 mg | ORAL_TABLET | Freq: Two times a day (BID) | ORAL | Status: DC
  Administered 2022-01-27 – 2022-01-29 (×4): 30 mg via ORAL
  Filled 2022-01-27 (×4): qty 1

## 2022-01-27 MED ORDER — METOPROLOL TARTRATE 25 MG PO TABS
25.0000 mg | ORAL_TABLET | Freq: Two times a day (BID) | ORAL | Status: DC
  Administered 2022-01-27 – 2022-01-29 (×5): 25 mg via ORAL
  Filled 2022-01-27 (×5): qty 1

## 2022-01-27 MED ORDER — DM-GUAIFENESIN ER 30-600 MG PO TB12
1.0000 | ORAL_TABLET | Freq: Two times a day (BID) | ORAL | Status: DC | PRN
  Administered 2022-01-27: 1 via ORAL
  Filled 2022-01-27: qty 1

## 2022-01-27 MED ORDER — ATORVASTATIN CALCIUM 20 MG PO TABS
40.0000 mg | ORAL_TABLET | Freq: Every day | ORAL | Status: DC
  Administered 2022-01-27 – 2022-01-29 (×3): 40 mg via ORAL
  Filled 2022-01-27 (×3): qty 2

## 2022-01-27 MED ORDER — INSULIN GLARGINE-YFGN 100 UNIT/ML ~~LOC~~ SOLN
25.0000 [IU] | Freq: Every day | SUBCUTANEOUS | Status: DC
  Administered 2022-01-27: 25 [IU] via SUBCUTANEOUS
  Filled 2022-01-27: qty 0.25

## 2022-01-27 MED ORDER — INSULIN ASPART 100 UNIT/ML IJ SOLN
0.0000 [IU] | Freq: Three times a day (TID) | INTRAMUSCULAR | Status: DC
  Administered 2022-01-27: 9 [IU] via SUBCUTANEOUS
  Administered 2022-01-27: 1 [IU] via SUBCUTANEOUS
  Administered 2022-01-28 (×2): 5 [IU] via SUBCUTANEOUS
  Administered 2022-01-29: 2 [IU] via SUBCUTANEOUS
  Administered 2022-01-29: 3 [IU] via SUBCUTANEOUS
  Filled 2022-01-27 (×6): qty 1

## 2022-01-27 MED ORDER — ONDANSETRON HCL 4 MG/2ML IJ SOLN: 4.0000 mg | Freq: Three times a day (TID) | INTRAMUSCULAR | Status: DC | PRN

## 2022-01-27 MED ORDER — AMLODIPINE BESYLATE 10 MG PO TABS
10.0000 mg | ORAL_TABLET | Freq: Every day | ORAL | Status: DC
  Administered 2022-01-27 – 2022-01-29 (×3): 10 mg via ORAL
  Filled 2022-01-27 (×3): qty 1

## 2022-01-27 MED ORDER — HYDRALAZINE HCL 20 MG/ML IJ SOLN: 5.0000 mg | INTRAMUSCULAR | Status: DC | PRN

## 2022-01-27 MED ORDER — ACETAMINOPHEN 325 MG PO TABS
650.0000 mg | ORAL_TABLET | Freq: Four times a day (QID) | ORAL | Status: DC | PRN
  Administered 2022-01-29: 650 mg via ORAL
  Filled 2022-01-27: qty 2

## 2022-01-27 MED ORDER — MOMETASONE FURO-FORMOTEROL FUM 200-5 MCG/ACT IN AERO
2.0000 | INHALATION_SPRAY | Freq: Two times a day (BID) | RESPIRATORY_TRACT | Status: DC
  Administered 2022-01-27 – 2022-01-29 (×4): 2 via RESPIRATORY_TRACT
  Filled 2022-01-27: qty 8.8

## 2022-01-27 MED ORDER — ENOXAPARIN SODIUM 80 MG/0.8ML IJ SOSY
0.5000 mg/kg | PREFILLED_SYRINGE | INTRAMUSCULAR | Status: DC
  Administered 2022-01-27 – 2022-01-29 (×3): 70 mg via SUBCUTANEOUS
  Filled 2022-01-27: qty 0.8
  Filled 2022-01-27: qty 0.7
  Filled 2022-01-27: qty 0.8

## 2022-01-27 MED ORDER — NITROGLYCERIN 0.4 MG SL SUBL: 0.4000 mg | SUBLINGUAL_TABLET | SUBLINGUAL | Status: DC | PRN

## 2022-01-27 MED ORDER — METHYLPREDNISOLONE SODIUM SUCC 125 MG IJ SOLR
80.0000 mg | Freq: Every day | INTRAMUSCULAR | Status: DC
  Administered 2022-01-27: 80 mg via INTRAVENOUS
  Filled 2022-01-27: qty 2

## 2022-01-27 NOTE — Assessment & Plan Note (Signed)
  BMI= 40.50   and BW= 139.3 -Diet and exercise.   -Encouraged to lose weight.

## 2022-01-27 NOTE — ED Notes (Signed)
Pt went to xray and back.

## 2022-01-27 NOTE — ED Provider Notes (Signed)
[facility] Provider Note    Event Date/Time   First MD Initiated Contact with Patient 01/27/22 [phone]     (approximate)   History   Chest Pain Cody Alexander(X2d)   HPI  Cody Alexander is a 66 y.o. male  here with chest pain. Pt reports that over the past few days, he has had progressively worsening leg swelling, weakness, and SOB. He has had difficulty even getting out of his chair and has been essentially unable to support himself. He has been having to go to the bathroom on himself. He's had worsening cough, orthopnea. Has not taken any of his medications in the last several days as well. Denies any fevers. No known sick contacts. He has had some intermittent aching, throbbing CP as well, worse w/ exertion and lying flat. Feels like he has fluid on his lungs. Denies any known sick contacts.       Physical Exam   Triage Vital Signs: ED Triage Vitals  Enc Vitals Group     BP 01/27/22 0803 136/86     Pulse Rate 01/27/22 0803 80     Resp 01/27/22 0803 20     Temp --      Temp src --      SpO2 01/27/22 0803 90 %     Weight 01/27/22 0806 (!) 307 lb (139.3 kg)     Height 01/27/22 0806 6\' 1"  (1.854 m)     Head Circumference --      Peak Flow --      Pain Score 01/27/22 0805 7     Pain Loc --      Pain Edu? --      Excl. in GC? --     Most recent vital signs: Vitals:   01/27/22 1300 01/27/22 1525  BP: (!) 155/93 (!) 160/98  Pulse: 82 85  Resp: 19   Temp:  98.6 F (37 C)  SpO2: 96% 96%     General: Awake, no distress.  CV:  Good peripheral perfusion. RRR. No murmurs. Resp:  Normal effort. Slight tachypnea. Diffuse wheezing b/l lung fields, with bibasilar rales. O2 nasal cannula in place. Abd:  Mild distension/edema but no tenderness. No rebound or guarding. Other:  2+ pitting edema b/l LE.    ED Results / Procedures / Treatments   Labs (all labs ordered are listed, but only abnormal results are displayed) Labs Reviewed  CBC WITH  DIFFERENTIAL/PLATELET - Abnormal; Notable for the following components:      Result Value   Abs Immature Granulocytes 0.09 (*)    All other components within normal limits  COMPREHENSIVE METABOLIC PANEL - Abnormal; Notable for the following components:   CO2 36 (*)    Calcium 8.6 (*)    Anion gap 1 (*)    All other components within normal limits  CBG MONITORING, ED - Abnormal; Notable for the following components:   Glucose-Capillary 128 (*)    All other components within normal limits  TROPONIN I (HIGH SENSITIVITY) - Abnormal; Notable for the following components:   Troponin I (High Sensitivity) 108 (*)    All other components within normal limits  TROPONIN I (HIGH SENSITIVITY) - Abnormal; Notable for the following components:   Troponin I (High Sensitivity) 106 (*)    All other components within normal limits  TROPONIN I (HIGH SENSITIVITY) - Abnormal; Notable for the following components:   Troponin I (High Sensitivity) 110 (*)    All other components within normal limits  SARS  CORONAVIRUS 2 BY RT PCR  EXPECTORATED SPUTUM ASSESSMENT W GRAM STAIN, RFLX TO RESP C  MAGNESIUM  BRAIN NATRIURETIC PEPTIDE  HEMOGLOBIN A1C  RPR  VITAMIN B12     EKG Normal sinus rhythm, VR 82. PR 210, QRS 93, QTc 484. No acute St elevations. No ischemia or infarct. Borderline prolonged qt.   RADIOLOGY CXR: Low lugn volumes, no acute edema/PNA   I also independently reviewed and agree with radiologist interpretations.   PROCEDURES:  Critical Care performed: No  .1-3 Lead EKG Interpretation Performed by: Shaune Pollack, MD Authorized by: Shaune Pollack, MD     Interpretation: normal     ECG rate:  70-90   ECG rate assessment: normal     Rhythm: sinus rhythm     Ectopy: none     Conduction: normal   Comments:     Indication: SOB    MEDICATIONS ORDERED IN ED: Medications  ipratropium-albuterol (DUONEB) 0.5-2.5 (3) MG/3ML nebulizer solution 3 mL (3 mLs Nebulization Given 01/27/22  1523)  albuterol (PROVENTIL) (2.5 MG/3ML) 0.083% nebulizer solution 2.5 mg (has no administration in time range)  dextromethorphan-guaiFENesin (MUCINEX DM) 30-600 MG per 12 hr tablet 1 tablet (1 tablet Oral Given 01/27/22 1206)  ondansetron (ZOFRAN) injection 4 mg (has no administration in time range)  acetaminophen (TYLENOL) tablet 650 mg (has no administration in time range)  hydrALAZINE (APRESOLINE) injection 5 mg (has no administration in time range)  insulin aspart (novoLOG) injection 0-9 Units (1 Units Subcutaneous Given 01/27/22 1121)  insulin aspart (novoLOG) injection 0-5 Units (has no administration in time range)  aspirin EC tablet 81 mg (has no administration in time range)  enoxaparin (LOVENOX) injection 70 mg (70 mg Subcutaneous Given 01/27/22 1107)  methylPREDNISolone sodium succinate (SOLU-MEDROL) 125 mg/2 mL injection 80 mg (80 mg Intravenous Given 01/27/22 1108)  furosemide (LASIX) injection 60 mg (has no administration in time range)  amLODipine (NORVASC) tablet 10 mg (10 mg Oral Given 01/27/22 1604)  atorvastatin (LIPITOR) tablet 40 mg (40 mg Oral Given 01/27/22 1604)  isosorbide dinitrate (ISORDIL) tablet 30 mg (has no administration in time range)  metoprolol tartrate (LOPRESSOR) tablet 25 mg (25 mg Oral Given 01/27/22 1604)  gabapentin (NEURONTIN) tablet 600 mg (600 mg Oral Given 01/27/22 1604)  mometasone-formoterol (DULERA) 200-5 MCG/ACT inhaler 2 puff (has no administration in time range)  nitroGLYCERIN (NITROSTAT) SL tablet 0.4 mg (has no administration in time range)  insulin glargine-yfgn (SEMGLEE) injection 25 Units (has no administration in time range)  furosemide (LASIX) injection 40 mg (40 mg Intravenous Given 01/27/22 0934)  ipratropium-albuterol (DUONEB) 0.5-2.5 (3) MG/3ML nebulizer solution 3 mL (3 mLs Nebulization Given 01/27/22 0938)  ipratropium-albuterol (DUONEB) 0.5-2.5 (3) MG/3ML nebulizer solution 3 mL (3 mLs Nebulization Given 01/27/22 0937)  furosemide (LASIX) injection  40 mg (40 mg Intravenous Given 01/27/22 1107)     IMPRESSION / MDM / ASSESSMENT AND PLAN / ED COURSE  I reviewed the triage vital signs and the nursing notes.                               The patient is on the cardiac monitor to evaluate for evidence of arrhythmia and/or significant heart rate changes.   Ddx:  Differential includes the following, with pertinent life- or limb-threatening emergencies considered:  CHF exacerbation, COPD exacerbation, ACS, PNA, PE, PTX, anemia, electrolyte abnormality  Patient's presentation is most consistent with acute presentation with potential threat to life or bodily function.  MDM:  66 year old male with history of CHF, hypertension, hyperlipidemia, COPD on chronic oxygen, here with shortness of breath, weakness, fall.  Suspect acute on chronic CHF exacerbation with significant pitting edema bilateral lower extremities.  Patient has a history of CHF despite normal BNP, which I suspect is due to his diastolic failure as well as obesity.  He clinically appears to have anasarca.  Patient also likely has a component of deconditioning.  Troponin elevated at 110, which I suspect is related to his CHF and demand.  No ST elevations noted.  CMP shows renal function is at baseline.  BNP normal as mentioned.  CBC without significant leukocytosis or anemia.  Chest x-ray shows low lung volumes but no focal abnormality.  CT head is negative.  We will plan to admit for CHF.   MEDICATIONS GIVEN IN ED: Medications  ipratropium-albuterol (DUONEB) 0.5-2.5 (3) MG/3ML nebulizer solution 3 mL (3 mLs Nebulization Given 01/27/22 1523)  albuterol (PROVENTIL) (2.5 MG/3ML) 0.083% nebulizer solution 2.5 mg (has no administration in time range)  dextromethorphan-guaiFENesin (MUCINEX DM) 30-600 MG per 12 hr tablet 1 tablet (1 tablet Oral Given 01/27/22 1206)  ondansetron (ZOFRAN) injection 4 mg (has no administration in time range)  acetaminophen (TYLENOL) tablet 650 mg (has no  administration in time range)  hydrALAZINE (APRESOLINE) injection 5 mg (has no administration in time range)  insulin aspart (novoLOG) injection 0-9 Units (1 Units Subcutaneous Given 01/27/22 1121)  insulin aspart (novoLOG) injection 0-5 Units (has no administration in time range)  aspirin EC tablet 81 mg (has no administration in time range)  enoxaparin (LOVENOX) injection 70 mg (70 mg Subcutaneous Given 01/27/22 1107)  methylPREDNISolone sodium succinate (SOLU-MEDROL) 125 mg/2 mL injection 80 mg (80 mg Intravenous Given 01/27/22 1108)  furosemide (LASIX) injection 60 mg (has no administration in time range)  amLODipine (NORVASC) tablet 10 mg (10 mg Oral Given 01/27/22 1604)  atorvastatin (LIPITOR) tablet 40 mg (40 mg Oral Given 01/27/22 1604)  isosorbide dinitrate (ISORDIL) tablet 30 mg (has no administration in time range)  metoprolol tartrate (LOPRESSOR) tablet 25 mg (25 mg Oral Given 01/27/22 1604)  gabapentin (NEURONTIN) tablet 600 mg (600 mg Oral Given 01/27/22 1604)  mometasone-formoterol (DULERA) 200-5 MCG/ACT inhaler 2 puff (has no administration in time range)  nitroGLYCERIN (NITROSTAT) SL tablet 0.4 mg (has no administration in time range)  insulin glargine-yfgn (SEMGLEE) injection 25 Units (has no administration in time range)  furosemide (LASIX) injection 40 mg (40 mg Intravenous Given 01/27/22 0934)  ipratropium-albuterol (DUONEB) 0.5-2.5 (3) MG/3ML nebulizer solution 3 mL (3 mLs Nebulization Given 01/27/22 0938)  ipratropium-albuterol (DUONEB) 0.5-2.5 (3) MG/3ML nebulizer solution 3 mL (3 mLs Nebulization Given 01/27/22 0937)  furosemide (LASIX) injection 40 mg (40 mg Intravenous Given 01/27/22 1107)     Consults:  Hospitalist  EMR reviewed  Reviewed DC summary from 2022 visit for CHF and COPD, similar presentation     FINAL CLINICAL IMPRESSION(S) / ED DIAGNOSES   Final diagnoses:  Acute congestive heart failure, unspecified heart failure type (HCC)  COPD exacerbation (HCC)     Rx /  DC Orders   ED Discharge Orders     None        Note:  This document was prepared using Dragon voice recognition software and may include unintentional dictation errors.   Shaune Pollack, MD 01/27/22 1626

## 2022-01-27 NOTE — Assessment & Plan Note (Signed)
lipitor

## 2022-01-27 NOTE — ED Notes (Signed)
Wrote to Dr Clyde Lundborg to clarify order for second 40mg  lasix.

## 2022-01-27 NOTE — ED Notes (Signed)
Pt had used urinal for second time and urinated on floor for second time. Also all monitoring leads had come off. Pt is unsteady on feet. Pt counseled to accept external catheter and finally accepted. Pt eating breakfast tray.

## 2022-01-27 NOTE — Assessment & Plan Note (Signed)
CT head negative. - Fall precaution -PT/OT 

## 2022-01-27 NOTE — ED Notes (Signed)
Lab called, troponin is 108. Provider informed.

## 2022-01-27 NOTE — Assessment & Plan Note (Signed)
-  Bronchodilators -Solu-Medrol 40 mg IV bid -Mucinex for cough  -Incentive spirometry -sputum culture -Nasal cannula oxygen as needed to maintain O2 saturation 93% or greater

## 2022-01-27 NOTE — Consult Note (Signed)
NEURO HOSPITALIST CONSULT NOTE   Requestig physician: Dr. Clyde Lundborg  Reason for Consult: Bilateral lower extremity weakness  History obtained from:   Patient and Chart     HPI:                                                                                                                                          Cody Alexander is an 66 y.o. male with a PMHx of morbid obesity, CHF, COPD, DM, HTN and neuropathy who presented to the ED this morning with a chief complaint of mid-sternal pressure-like chest pain of two days duration. He endorsed having had a fall from a seated position 2 nights previously when a chair he sat down in buckled causing him to fall backwards onto the kitchen floor. Patient states he may have lost consciousness, but his memory for the event is not clear. On further questioning he revealed bilateral leg weakness and BUE tremor for about 2-3 days. He was admitted and Neurology consulted for BLE weakness.   On further interview by Neurology, the patient reveals that his legs have been weak for the past several days. He had increasing difficulty walking the night prior to admission. At that time he had friends over at his home and when he felt the need to have a bowel movement he states that he needed to lean up against the wall in an attempt to stand and make it to the bathroom, but that he collapsed and then lost bowel continence. He states his friends brought him to his bed and he rested there overnight. The next morning EMS was called to bring him to the ED.   On exam, severe upper and lower extremity asterixis is noted. The patient then states that his legs had given way due to the "jerking" motions of his lower extremities, which are new over the past several days. He also endorses difficulty with manual tasks using his arms, such as eating, due to the limb jerking that is precipitated by movement. He has fresh stains on his shirt from multiple items of  dropped food from the meal that he completed just prior to the Neurology evaluation.   Past Medical History:  Diagnosis Date   CHF (congestive heart failure) (HCC)    COPD (chronic obstructive pulmonary disease) (HCC)    Diabetes mellitus without complication (HCC)    Hypertension    Neuropathy     Past Surgical History:  Procedure Laterality Date   LEFT HEART CATH AND CORONARY ANGIOGRAPHY Right 02/27/2018   Procedure: LEFT HEART CATH AND CORONARY ANGIOGRAPHY;  Surgeon: Laurier Nancy, MD;  Location: [facility] INVASIVE CV LAB;  Service: Cardiovascular;  Laterality: Right;    Family History  Problem Relation Age of Onset   Stroke Mother  Hypertension Father             Social History:  reports that he has never smoked. He has never used smokeless tobacco. He reports that he does not drink alcohol and does not use drugs.  Allergies  Allergen Reactions   Erythromycin Anaphylaxis and Swelling    Had eye swelling with erythromycin during a time when he had perf ear drum.  Tolerated azithromycin.    MEDICATIONS:                                                                                                                     Prior to Admission:  Medications Prior to Admission  Medication Sig Dispense Refill Last Dose   acetaminophen (TYLENOL) 500 MG tablet Take 1,000 mg by mouth every 6 (six) hours as needed for mild pain or moderate pain.   Unknown at PRN   amLODipine (NORVASC) 10 MG tablet Take 10 mg by mouth daily.   Past Week at Unknown   atorvastatin (LIPITOR) 40 MG tablet Take 40 mg by mouth daily.   Past Week at Unknown   budesonide-formoterol (SYMBICORT) 160-4.5 MCG/ACT inhaler Inhale 2 puffs into the lungs 2 (two) times daily.   Unknown at Unknown   furosemide (LASIX) 80 MG tablet Take 80 mg by mouth 2 (two) times daily.   Past Week at Unknown   gabapentin (NEURONTIN) 600 MG tablet Take 600 mg by mouth 3 (three) times daily.   Past Week at Unknown   insulin detemir  (LEVEMIR) 100 UNIT/ML FlexPen Inject 50 Units into the skin in the morning and at bedtime.   Past Week at Unknown   insulin lispro (HUMALOG) 100 UNIT/ML KwikPen Inject 16 Units into the skin 3 (three) times daily with meals.   Past Week at Unknown   isosorbide dinitrate (ISORDIL) 30 MG tablet Take 30 mg by mouth every morning.   Past Week at Unknown   lisinopril (ZESTRIL) 20 MG tablet Take 20 mg by mouth daily.   Past Week at Unknown   metFORMIN (GLUCOPHAGE) 1000 MG tablet Take 1,000 mg by mouth 2 (two) times daily.   Past Week at Unknown   metoprolol tartrate (LOPRESSOR) 25 MG tablet Take 25 mg by mouth 2 (two) times daily.   Past Week at Unknown   PROAIR HFA 108 (90 Base) MCG/ACT inhaler Inhale 2 puffs into the lungs every 4 (four) hours as needed for wheezing. 18 g 0 Unknown at PRN   spironolactone (ALDACTONE) 25 MG tablet Take 12.5 mg by mouth daily.   Past Week at Unknown   Scheduled:  amLODipine  10 mg Oral Daily   [START ON 01/28/2022] aspirin EC  81 mg Oral Daily   atorvastatin  40 mg Oral Daily   enoxaparin (LOVENOX) injection  0.5 mg/kg Subcutaneous Q24H   [START ON 01/28/2022] furosemide  60 mg Intravenous Q12H   gabapentin  600 mg Oral TID   insulin aspart  0-5 Units Subcutaneous QHS   insulin aspart  0-9 Units Subcutaneous TID WC   insulin glargine-yfgn  25 Units Subcutaneous QHS   ipratropium-albuterol  3 mL Nebulization Q4H   isosorbide dinitrate  30 mg Oral BID   methylPREDNISolone (SOLU-MEDROL) injection  80 mg Intravenous Daily   metoprolol tartrate  25 mg Oral BID   mometasone-formoterol  2 puff Inhalation BID     ROS:                                                                                                                                       As per HPI.    Blood pressure 128/80, pulse 86, temperature 98.2 F (36.8 C), resp. rate 18, height 6\' 1"  (1.854 m), weight (!) 139.3 kg, SpO2 93 %.   General Examination:                                                                                                        Physical Exam  HEENT-  [place]/AT    Lungs- Shallow tachypneic breaths when speaking, but not when at rest Extremities- Edema to BLE with combined pitting and non-pitting components. Chronic skin changes to distal BLE including thickening and increased pigmentation   Neurological Examination Mental Status: Awake and alert. Oriented x 5. Thought content is linear. Pleasant and cooperative. Speech fluent without evidence of aphasia.  Able to follow all commands without difficulty. Mild dysarthria noted.  Cranial Nerves: II: Temporal visual fields intact with no extinction to DSS. PERRL.   III,IV, VI: No ptosis. EOMI. No nystagmus.   V: Temp sensation intact bilaterally VII: Smile symmetric. No bulbar weakness noted. VIII: Hearing intact to conversation.  IX,X: No hypophonia or hoarseness noted XI: Head rotation with 5/5 strength to R and L. Neck flexion and extension is 5/5. XII: Midline tongue extension Motor: Prominent asterixis is noted when arms and legs are held antigravity, which resolves completely when limbs are at rest.  In between beats of asterixis, there is 5/5 strength in upper and lower extremities proximally and distally.  Sensory: Temp and light touch intact throughout, bilaterally.  Deep Tendon Reflexes: 0 bilateral patellae and achilles. 1+ bilateral brachioradialis and biceps.  Plantars: Mute bilaterally  Cerebellar: When observing movements closely in between beats of asterixis, there is no tremor or ataxia with FNF bilaterally.  Gait: Deferred due to falls risk concerns.    Lab Results: Basic Metabolic Panel: Recent Labs  Lab 01/27/22 0812  NA 139  K 4.3  CL 102  CO2 36*  GLUCOSE 88  BUN 11  CREATININE 0.83  CALCIUM 8.6*  MG 2.3    CBC: Recent Labs  Lab 01/27/22 0812  WBC 8.1  NEUTROABS 5.5  HGB 13.4  HCT 44.6  MCV 95.9  PLT 193    Cardiac Enzymes: No results for input(s): CKTOTAL, CKMB,  CKMBINDEX, TROPONINI in the last 168 hours.  Lipid Panel: No results for input(s): CHOL, TRIG, HDL, CHOLHDL, VLDL, LDLCALC in the last 168 hours.  Imaging: DG Chest 2 View  Result Date: 01/27/2022 CLINICAL DATA:  66 year old male with history of chest pain. EXAM: CHEST - 2 VIEW COMPARISON:  Chest x-ray 08/15/2021. FINDINGS: Images under penetrated limiting the diagnostic sensitivity and specificity of the examination. With these limitations in mind, lung volumes are low. No consolidative airspace disease. No pleural effusions. No pneumothorax. No pulmonary nodule or mass noted. Pulmonary vasculature and the cardiomediastinal silhouette are within normal limits. Atherosclerotic calcifications are noted in the thoracic aorta. IMPRESSION: 1. Low lung volumes without radiographic evidence of acute cardiopulmonary disease. 2. Aortic atherosclerosis. Electronically Signed   By: Trudie Reedaniel  Entrikin M.D.   On: 01/27/2022 08:55   CT HEAD WO CONTRAST (5MM)  Result Date: 01/27/2022 CLINICAL DATA:  Chest pain, head trauma EXAM: CT HEAD WITHOUT CONTRAST TECHNIQUE: Contiguous axial images were obtained from the base of the skull through the vertex without intravenous contrast. RADIATION DOSE REDUCTION: This exam was performed according to the departmental dose-optimization program which includes automated exposure control, adjustment of the mA and/or kV according to patient size and/or use of iterative reconstruction technique. COMPARISON:  04/21/2021 CT maxillofacial, 03/13/2020 CT head FINDINGS: Brain: No evidence of acute infarction, hemorrhage, cerebral edema, mass, mass effect, or midline shift. No hydrocephalus or extra-axial fluid collection. Vascular: No hyperdense vessel. Skull: Normal. Negative for fracture or focal lesion. Sinuses/Orbits: Mild mucosal thickening in the ethmoid air cells. The orbits are unremarkable. Other: The mastoid air cells are well aerated. IMPRESSION: No acute intracranial process.  Electronically Signed   By: Wiliam KeAlison  Vasan M.D.   On: 01/27/2022 11:26     Assessment: 66 year old male presenting after 2 falls at home.   1. Exam reveals prominent asterixis in all 4 extremities. Reflexes 1+ in BUE but with 0 bilateral patellae and achilles, most likely secondary to his chronic peripheral neuropathy and distal BLE edema. Speech is clear and fluent without evidence for confusion or aphasia.  2. CT head: No evidence of acute infarction, hemorrhage, cerebral edema, mass, mass effect, or midline shift. No hydrocephalus or extra-axial fluid collection. 3. On Neurontin 600 mg TID. This is a relatively high dose. Asterixis is a potential side effect of Neurontin, per the literature. The asterixis can be disabling and can be severe enough to result in falls based on case reports.  4. AST, ALT and renal function labs are normal. Uremia or hepatic encephalopathy as potential etiologies for his asterixis are essentially ruled out, but will need to obtain an ammonia level.   Recommendations: 1. EEG in the morning to rule out an epileptic cause for his asterixis.  2. Hold Neurontin and observe for possible improvement. 3. Ammonia level.  4. PT/OT  Electronically signed: Dr. Caryl PinaEric Elijiah Mickley 01/27/2022, 9:05 PM

## 2022-01-27 NOTE — Assessment & Plan Note (Signed)
Acute on chronic respiratory failure with hypoxia due to combination of CHF and COPD exacerbation.  Patient has 2+ leg edema and crackles on auscultation, clinically consistent with CHF exacerbation.  His BNP is normal at 50.9, which is likely due to obesity.  Patient has wheezing on auscultation, indicating COPD exacerbation.  -Admitted to PCU as inpatient -Bronchodilators -IV Lasix for CHF exacerbation -Nasal cannula oxygen to maintain oxygen saturation above 93%

## 2022-01-27 NOTE — Assessment & Plan Note (Signed)
CAD and elevarted trop 108 --> 106. Pt had some chest pain earlier, which has resolved.  Possibly due to demand ischemia. -Trend troponin -Check A1c, FLP -Aspirin, Lipitor - prn Nitroglycerin

## 2022-01-27 NOTE — Assessment & Plan Note (Signed)
IV hydralazine as needed -Patient is on IV Lasix -Amlodipine, metoprolol

## 2022-01-27 NOTE — Assessment & Plan Note (Addendum)
Patient has bilateral leg weakness with decreased sensation in both legs.  Etiology is not clear.  Patient denies any back pain.  Differential diagnosis include peripheral neuropathy and Guillain-Barr syndrome.  Consulted Dr. Otelia Limes of neurology. -f/u neurologist's recommendation -PT/OT -check Vb12 and RPR

## 2022-01-27 NOTE — ED Triage Notes (Signed)
Pt to ED from home [facility]  Pt complains of chest pain since 2 days, mid chest, pressure, non radiating.  Pt was sitting in shower chair in kitchen 2 nights ago, chair buckled and pt fell backward onto kitchen floor. Pt states "I might have blacked out".  Pt states had "a light stroke" 1 month ago but not diagnosed by MD. States since then has been stuttering.  Pt also complains of bilateral leg weakness and hand cramping. Hx CHF COPD DM HTN. Has not taken home meds for 2-3 days.   EMS VS: 12 lead unremarkable 96% SPO2 on chronic 3L, temp 98.1, CBG 128. Received 324 chewable ASA by EMS  Pt states legs buclking and has hardly been able to walk since 2 days ago

## 2022-01-27 NOTE — Assessment & Plan Note (Signed)
Recent A1c 13.0.  Poorly controlled.  Patient taking metformin, Humalog, Levemir 50 units daily -Sliding scale insulin -Glargine insulin 25 units daily

## 2022-01-27 NOTE — Assessment & Plan Note (Addendum)
2D echo on 08/16/2021 showed EF 60 to 65% with grade 1 diastolic dysfunction.  Patient has CHF exacerbation.  -give total of 80 mg of lasix (40 mg x 20), then Lasix 60 mg bid by IV  -Daily weights -strict I/O's -Low salt diet -Fluid restriction -Isordil

## 2022-01-27 NOTE — Assessment & Plan Note (Signed)
See above

## 2022-01-27 NOTE — H&P (Signed)
History and Physical    Cody Alexander LNL:[id number] DOB: Mar 27, 1956 DOA: 01/27/2022  Referring MD/NP/PA:   PCP: Gildardo Pounds, PA   Patient coming from:  The patient is coming from home.  At baseline, pt is independent for most of ADL.        Chief Complaint: Shortness of breath, chest pain, bilateral leg weakness, fall  HPI: Cody Alexander is a 66 y.o. male with medical history significant of dCHF, HTN, HLD, DM, COPD on 3 L oxygen, CAD, non-STEMI, morbid obesity with BMI 40.5, OSA not on CPAP, BPH, who presents with shortness breath, chest pain, bilateral leg weakness and fall.  Patient states that he has shortness of breath in the past several days, which has been progressively worsening.  Patient has cough with little mucus production.  No fever or chills.  He states that he had some chest pain earlier, which has resolved.  Patient has worsening bilateral lower leg edema.  Currently no active chest pain.  No nausea, vomiting, diarrhea or abdominal pain.  No symptoms of UTI.  Patient states that he has bilateral leg weakness for more than 3 days, associated with decreased sensation.  Denies back pain.  Patient states that he fell out of the chair 2 days ago. He is not sure if he lost consciousness.  He states that he hit his head.  Currently no headache or neck pain.  Data Reviewed and ED Course: pt was found to have troponin level 108, BNP 50.9, pending COVID PCR, GFR> 60, magnesium 2.3, temperature normal, blood pressure 148/100, heart rate 88, RR 21, oxygen saturation 90-98% on 4 L oxygen (patient is normally on 3 L oxygen at home).  Chest x-ray showed low volume without infiltration.  Patient is admitted to PCU as inpatient   EKG: I have personally reviewed.  Sinus rhythm, QTc 484, poor R wave progression, LAD, mild ST depression/T wave inversion in lead I/aVL.   Review of Systems:   General: no fevers, chills, no body weight gain, has poor appetite, has fatigue HEENT: no  blurry vision, hearing changes or sore throat Respiratory: Has dyspnea, coughing, wheezing CV: had chest pain, no palpitations GI: no nausea, vomiting, abdominal pain, diarrhea, constipation GU: no dysuria, burning on urination, increased urinary frequency, hematuria  Ext: has leg edema Neuro:  no vision change or hearing loss. Has fall and bilateral leg weakness Skin: no rash, no skin tear. MSK: No muscle spasm, no deformity, no limitation of range of movement in spin Heme: No easy bruising.  Travel history: No recent long distant travel.   Allergy:  Allergies  Allergen Reactions   Erythromycin Anaphylaxis and Swelling    Had eye swelling with erythromycin during a time when he had perf ear drum.  Tolerated azithromycin.    Past Medical History:  Diagnosis Date   CHF (congestive heart failure) (HCC)    COPD (chronic obstructive pulmonary disease) (HCC)    Diabetes mellitus without complication (HCC)    Hypertension    Neuropathy     Past Surgical History:  Procedure Laterality Date   LEFT HEART CATH AND CORONARY ANGIOGRAPHY Right 02/27/2018   Procedure: LEFT HEART CATH AND CORONARY ANGIOGRAPHY;  Surgeon: Laurier Nancy, MD;  Location: [facility] INVASIVE CV LAB;  Service: Cardiovascular;  Laterality: Right;    Social History:  reports that he has never smoked. He has never used smokeless tobacco. He reports that he does not drink alcohol and does not use drugs.  Family History:  Family History  Problem Relation Age of Onset   Stroke Mother    Hypertension Father      Prior to Admission medications   Medication Sig Start Date End Date Taking? Authorizing Provider  acetaminophen (TYLENOL) 500 MG tablet Take 1,000 mg by mouth every 6 (six) hours as needed. 10/21/20   [provider]  amLODipine (NORVASC) 10 MG tablet Take 1 tablet by mouth daily. 11/14/20   [provider]  aspirin EC 81 MG tablet Take 81 mg by mouth daily. Swallow whole. Patient not taking:  Reported on 08/15/2021    [provider]  atorvastatin (LIPITOR) 40 MG tablet Take 1 tablet by mouth daily. 11/14/20   [provider]  chlorpheniramine-HYDROcodone (TUSSIONEX) 10-8 MG/5ML SUER Take 5 mLs by mouth every 12 (twelve) hours as needed for cough. 08/18/21   Alford Highland, MD  furosemide (LASIX) 80 MG tablet Take 160 mg by mouth 2 (two) times daily. 11/14/20   [provider]  insulin lispro (HUMALOG) 100 UNIT/ML injection Inject 0.16 mLs (16 Units total) into the skin in the morning, at noon, and at bedtime. 08/18/21 09/17/21  Alford Highland, MD  ipratropium (ATROVENT HFA) 17 MCG/ACT inhaler Inhale 1 puff into the lungs every 6 (six) hours as needed. 02/01/19   [provider]  ipratropium-albuterol (DUONEB) 0.5-2.5 (3) MG/3ML SOLN Inhale 3 mLs into the lungs every 6 (six) hours as needed. 08/18/21 09/17/21  Alford Highland, MD  isosorbide dinitrate (ISORDIL) 30 MG tablet Take 30 mg by mouth every morning. 11/14/20   [provider]  LANTUS 100 UNIT/ML injection Inject 0.5 mLs (50 Units total) into the skin daily. 08/18/21   Alford Highland, MD  metFORMIN (GLUCOPHAGE) 1000 MG tablet Take 1,000 mg by mouth 2 (two) times daily. 06/02/20   [provider]  metoprolol tartrate (LOPRESSOR) 25 MG tablet Take 25 mg by mouth 2 (two) times daily.    [provider]  predniSONE (DELTASONE) 10 MG tablet 4 tabs po in evening for three days 08/18/21   Alford Highland, MD  [facility] HFA 108 [phone] Base) MCG/ACT inhaler Inhale 2 puffs into the lungs every 4 (four) hours as needed for wheezing. 08/18/21   Alford Highland, MD  SYMBICORT 160-4.5 MCG/ACT inhaler Inhale 2 puffs into the lungs 2 (two) times daily. 06/02/20   [provider]    Physical Exam: Vitals:   01/27/22 0934 01/27/22 0945 01/27/22 1000 01/27/22 1300  BP: (!) 144/89  (!) 138/97 (!) 155/93  Pulse: 80 (!) 101 86 82  Resp: Temp:      TempSrc:      SpO2:  98% 100% 98% 96%  Weight:      Height:       General: Not in acute distress HEENT:       Eyes: PERRL, EOMI, no scleral icterus.       ENT: No discharge from the ears and nose, no pharynx injection, no tonsillar enlargement.        Neck: Difficult to assess JVD due to morbid obesity, no bruit, no mass felt. Heme: No neck lymph node enlargement. Cardiac: S1/S2, RRR, No murmurs, No gallops or rubs. Respiratory: Has wheezing and crackles bilaterally GI: Soft, nondistended, nontender, no rebound pain, no organomegaly, BS present. GU: No hematuria Ext: Has 2+ pitting leg edema bilaterally. 1+DP/PT pulse bilaterally. Musculoskeletal: No joint deformities, No joint redness or warmth, no limitation of ROM in spin. Skin: No rashes.  Neuro: Alert, oriented X3, cranial nerves II-XII  grossly intact.  Has decreased sensation in both legs.  Has weakness in both legs, with muscle strength 3/5 in both legs.  Psych: Patient is not psychotic, no suicidal or hemocidal ideation.  Labs on Admission: I have personally reviewed following labs and imaging studies  CBC: Recent Labs  Lab 01/27/22 0812  WBC 8.1  NEUTROABS 5.5  HGB 13.4  HCT 44.6  MCV 95.9  PLT 193   Basic Metabolic Panel: Recent Labs  Lab 01/27/22 0812  NA 139  K 4.3  CL 102  CO2 36*  GLUCOSE 88  BUN 11  CREATININE 0.83  CALCIUM 8.6*  MG 2.3   GFR: Estimated Creatinine Clearance: 130.1 mL/min (by C-G formula based on SCr of 0.83 mg/dL). Liver Function Tests: Recent Labs  Lab 01/27/22 0812  AST 16  ALT 23  ALKPHOS 60  BILITOT 0.6  PROT 7.0  ALBUMIN 3.6   No results for input(s): LIPASE, AMYLASE in the last 168 hours. No results for input(s): AMMONIA in the last 168 hours. Coagulation Profile: No results for input(s): INR, PROTIME in the last 168 hours. Cardiac Enzymes: No results for input(s): CKTOTAL, CKMB, CKMBINDEX, TROPONINI in the last 168 hours. BNP (last 3 results) No results for input(s): PROBNP in the  last 8760 hours. HbA1C: No results for input(s): HGBA1C in the last 72 hours. CBG: Recent Labs  Lab 01/27/22 1116  GLUCAP 128*   Lipid Profile: No results for input(s): CHOL, HDL, LDLCALC, TRIG, CHOLHDL, LDLDIRECT in the last 72 hours. Thyroid Function Tests: No results for input(s): TSH, T4TOTAL, FREET4, T3FREE, THYROIDAB in the last 72 hours. Anemia Panel: No results for input(s): VITAMINB12, FOLATE, FERRITIN, TIBC, IRON, RETICCTPCT in the last 72 hours. Urine analysis:    Component Value Date/Time   COLORURINE YELLOW (A) 01/07/2021 1612   APPEARANCEUR CLEAR (A) 01/07/2021 1612   APPEARANCEUR Clear 08/20/2013 0849   LABSPEC 1.012 01/07/2021 1612   LABSPEC 1.014 08/20/2013 0849   PHURINE 7.0 01/07/2021 1612   GLUCOSEU 50 (A) 01/07/2021 1612   GLUCOSEU Negative 08/20/2013 0849   HGBUR SMALL (A) 01/07/2021 1612   BILIRUBINUR NEGATIVE 01/07/2021 1612   BILIRUBINUR Negative 08/20/2013 0849   KETONESUR NEGATIVE 01/07/2021 1612   PROTEINUR NEGATIVE 01/07/2021 1612   NITRITE NEGATIVE 01/07/2021 1612   LEUKOCYTESUR NEGATIVE 01/07/2021 1612   LEUKOCYTESUR Negative 08/20/2013 0849   Sepsis Labs: @LABRCNTIP (procalcitonin:4,lacticidven:4) ) Recent Results (from the past 240 hour(s))  SARS Coronavirus 2 by RT PCR (hospital order, performed in [facility] hospital lab) *cepheid single result test* Anterior Nasal Swab     Status: None   Collection Time: 01/27/22  8:12 AM   Specimen: Anterior Nasal Swab  Result Value Ref Range Status   SARS Coronavirus 2 by RT PCR NEGATIVE NEGATIVE Final    Comment: (NOTE) SARS-CoV-2 target nucleic acids are NOT DETECTED.  The SARS-CoV-2 RNA is generally detectable in upper and lower respiratory specimens during the acute phase of infection. The lowest concentration of SARS-CoV-2 viral copies this assay can detect is 250 copies / mL. A negative result does not preclude SARS-CoV-2 infection and should not be used as the sole basis for treatment or  other patient management decisions.  A negative result may occur with improper specimen collection / handling, submission of specimen other than nasopharyngeal swab, presence of viral mutation(s) within the areas targeted by this assay, and inadequate number of viral copies (<250 copies / mL). A negative result must be combined with clinical observations, patient history, and epidemiological information.  Fact Sheet for Patients:   RoadLapTop.co.zahttps://www.fda.gov/media/158405/download  Fact Sheet for Healthcare Providers: http://kim-miller.com/https://www.fda.gov/media/158404/download  This test is not yet approved or  cleared by the [place] FDA and has been authorized for detection and/or diagnosis of SARS-CoV-2 by FDA under an Emergency Use Authorization (EUA).  This EUA will remain in effect (meaning this test can be used) for the duration of the COVID-19 declaration under Section 564(b)(1) of the Act, 21 U.S.C. section 360bbb-3(b)(1), unless the authorization is terminated or revoked sooner.  Performed at [facility], [address] [place], [place] [id number]      Radiological Exams on Admission: DG Chest 2 View  Result Date: 01/27/2022 CLINICAL DATA:  66 year old male with history of chest pain. EXAM: CHEST - 2 VIEW COMPARISON:  Chest x-ray 08/15/2021. FINDINGS: Images under penetrated limiting the diagnostic sensitivity and specificity of the examination. With these limitations in mind, lung volumes are low. No consolidative airspace disease. No pleural effusions. No pneumothorax. No pulmonary nodule or mass noted. Pulmonary vasculature and the cardiomediastinal silhouette are within normal limits. Atherosclerotic calcifications are noted in the thoracic aorta. IMPRESSION: 1. Low lung volumes without radiographic evidence of acute cardiopulmonary disease. 2. Aortic atherosclerosis. Electronically Signed   By: Trudie Reedaniel  Entrikin M.D.   On: 01/27/2022 08:55   CT HEAD WO CONTRAST (5MM)  Result  Date: 01/27/2022 CLINICAL DATA:  Chest pain, head trauma EXAM: CT HEAD WITHOUT CONTRAST TECHNIQUE: Contiguous axial images were obtained from the base of the skull through the vertex without intravenous contrast. RADIATION DOSE REDUCTION: This exam was performed according to the departmental dose-optimization program which includes automated exposure control, adjustment of the mA and/or kV according to patient size and/or use of iterative reconstruction technique. COMPARISON:  04/21/2021 CT maxillofacial, 03/13/2020 CT head FINDINGS: Brain: No evidence of acute infarction, hemorrhage, cerebral edema, mass, mass effect, or midline shift. No hydrocephalus or extra-axial fluid collection. Vascular: No hyperdense vessel. Skull: Normal. Negative for fracture or focal lesion. Sinuses/Orbits: Mild mucosal thickening in the ethmoid air cells. The orbits are unremarkable. Other: The mastoid air cells are well aerated. IMPRESSION: No acute intracranial process. Electronically Signed   By: Wiliam KeAlison  Vasan M.D.   On: 01/27/2022 11:26      Assessment/Plan Principal Problem:   Acute on chronic respiratory failure with hypoxia (HCC) Active Problems:   Acute on chronic diastolic CHF (congestive heart failure) (HCC)   COPD with acute exacerbation (HCC)   CAD (coronary artery disease)   Elevated troponin   Essential hypertension   Fall at home, initial encounter   Type 2 diabetes mellitus with hyperglycemia, with long-term current use of insulin (HCC)   HLD (hyperlipidemia)   Obesity, Class III, BMI 40-49.9 (morbid obesity) (HCC)   Leg weakness, bilateral    Assessment and Plan: * Acute on chronic respiratory failure with hypoxia (HCC) Acute on chronic respiratory failure with hypoxia due to combination of CHF and COPD exacerbation.  Patient has 2+ leg edema and crackles on auscultation, clinically consistent with CHF exacerbation.  His BNP is normal at 50.9, which is likely due to obesity.  Patient has wheezing  on auscultation, indicating COPD exacerbation.  -Admitted to PCU as inpatient -Bronchodilators -IV Lasix for CHF exacerbation -Nasal cannula oxygen to maintain oxygen saturation above 93%  Acute on chronic diastolic CHF (congestive heart failure) (HCC) 2D echo on 08/16/2021 showed EF 60 to 65% with grade 1 diastolic dysfunction.  Patient has CHF exacerbation.  -give total of 80 mg of lasix (40 mg x 20),  then Lasix 60 mg bid by IV  -Daily weights -strict I/O's -Low salt diet -Fluid restriction -Isordil    COPD with acute exacerbation (HCC)  -Bronchodilators -Solu-Medrol 40 mg IV bid -Mucinex for cough  -Incentive spirometry -sputum culture -Nasal cannula oxygen as needed to maintain O2 saturation 93% or greater   CAD (coronary artery disease) CAD and elevarted trop 108 --> 106. Pt had some chest pain earlier, which has resolved.  Possibly due to demand ischemia. -Trend troponin -Check A1c, FLP -Aspirin, Lipitor - prn Nitroglycerin   Elevated troponin -See above  Essential hypertension IV hydralazine as needed -Patient is on IV Lasix -Amlodipine, metoprolol  Fall at home, initial encounter CT head negative. - Fall precaution -PT/OT  Type 2 diabetes mellitus with hyperglycemia, with long-term current use of insulin (HCC) Recent A1c 13.0.  Poorly controlled.  Patient taking metformin, Humalog, Levemir 50 units daily -Sliding scale insulin -Glargine insulin 25 units daily  HLD (hyperlipidemia) -lipitor  Obesity, Class III, BMI 40-49.9 (morbid obesity) (HCC)  BMI= 40.50   and BW= 139.3 -Diet and exercise.   -Encouraged to lose weight.   Leg weakness, bilateral Patient has bilateral leg weakness with decreased sensation in both legs.  Etiology is not clear.  Patient denies any back pain.  Differential diagnosis include peripheral neuropathy and Guillain-Barr syndrome.  Consulted Dr. Otelia Limes of neurology. -f/u neurologist's recommendation -PT/OT -check  Vb12 and RPR             DVT ppx:  SQ Lovenox  Code Status: Full code  Family Communication: I have tried to call his family without success     Disposition Plan:  Anticipate discharge back to previous environment  Consults called: Dr. Otelia Limes of neurology  Admission status and Level of care: Progressive:    as inpt        Severity of Illness:  The appropriate patient status for this patient is INPATIENT. Inpatient status is judged to be reasonable and necessary in order to provide the required intensity of service to ensure the patient's safety. The patient's presenting symptoms, physical exam findings, and initial radiographic and laboratory data in the context of their chronic comorbidities is felt to place them at high risk for further clinical deterioration. Furthermore, it is not anticipated that the patient will be medically stable for discharge from the hospital within 2 midnights of admission.   * I certify that at the point of admission it is my clinical judgment that the patient will require inpatient hospital care spanning beyond 2 midnights from the point of admission due to high intensity of service, high risk for further deterioration and high frequency of surveillance required.*       Date of Service 01/27/2022    Lorretta Harp [facility]   If 7PM-7AM, please contact night-coverage [email or web address] 01/27/2022, 1:48 PM

## 2022-01-28 ENCOUNTER — Other Ambulatory Visit: Payer: Self-pay | Admitting: Neurology

## 2022-01-28 LAB — LIPID PANEL
Cholesterol: 148 mg/dL (ref 0–200)
HDL: 39 mg/dL — ABNORMAL LOW (ref >40)
LDL Cholesterol: 93 mg/dL (ref 0–99)
Total CHOL/HDL Ratio: 3.8 RATIO
Triglycerides: 82 mg/dL (ref <150)
VLDL: 16 mg/dL (ref 0–40)

## 2022-01-28 LAB — BLOOD GAS, VENOUS
Acid-Base Excess: 15.8 mmol/L — ABNORMAL HIGH (ref 0.0–2.0)
Bicarbonate: 45.1 mmol/L — ABNORMAL HIGH (ref 20.0–28.0)
O2 Saturation: 54.3 %
Patient temperature: 37
pCO2, Ven: 78 mmHg (ref 44–60)
pH, Ven: 7.37 (ref 7.25–7.43)
pO2, Ven: 31 mmHg — CL (ref 32–45)

## 2022-01-28 LAB — BASIC METABOLIC PANEL
Anion gap: 5 (ref 5–15)
BUN: 19 mg/dL (ref 8–23)
CO2: 37 mmol/L — ABNORMAL HIGH (ref 22–32)
Calcium: 8.5 mg/dL — ABNORMAL LOW (ref 8.9–10.3)
Chloride: 97 mmol/L — ABNORMAL LOW (ref 98–111)
Creatinine, Ser: 1.11 mg/dL (ref 0.61–1.24)
GFR, Estimated: >60 mL/min (ref >60)
Glucose, Bld: 309 mg/dL — ABNORMAL HIGH (ref 70–99)
Potassium: 4.9 mmol/L (ref 3.5–5.1)
Sodium: 139 mmol/L (ref 135–145)

## 2022-01-28 LAB — RPR: RPR Ser Ql: NONREACTIVE

## 2022-01-28 LAB — GLUCOSE, CAPILLARY
Glucose-Capillary: 257 mg/dL — ABNORMAL HIGH (ref 70–99)
Glucose-Capillary: 251 mg/dL — ABNORMAL HIGH (ref 70–99)
Glucose-Capillary: 181 mg/dL — ABNORMAL HIGH (ref 70–99)

## 2022-01-28 LAB — MAGNESIUM: Magnesium: 2.2 mg/dL (ref 1.7–2.4)

## 2022-01-28 LAB — AMMONIA: Ammonia: 39 umol/L — ABNORMAL HIGH (ref 9–35)

## 2022-01-28 MED ORDER — PREDNISONE 20 MG PO TABS
40.0000 mg | ORAL_TABLET | Freq: Every day | ORAL | Status: DC
  Administered 2022-01-29: 40 mg via ORAL
  Filled 2022-01-28: qty 2

## 2022-01-28 MED ORDER — ASPIRIN 81 MG PO TBEC
81.0000 mg | DELAYED_RELEASE_TABLET | Freq: Every day | ORAL | Status: DC
  Administered 2022-01-28 – 2022-01-29 (×2): 81 mg via ORAL
  Filled 2022-01-28 (×2): qty 1

## 2022-01-28 MED ORDER — ALBUTEROL SULFATE (2.5 MG/3ML) 0.083% IN NEBU: 2.5000 mg | INHALATION_SOLUTION | RESPIRATORY_TRACT | Status: DC | PRN

## 2022-01-28 MED ORDER — INSULIN ASPART 100 UNIT/ML IJ SOLN
16.0000 [IU] | Freq: Three times a day (TID) | INTRAMUSCULAR | Status: DC
Start: 2022-01-28 — End: 2022-01-28
  Filled 2022-01-28: qty 1

## 2022-01-28 MED ORDER — FUROSEMIDE 40 MG PO TABS
80.0000 mg | ORAL_TABLET | Freq: Two times a day (BID) | ORAL | Status: DC
  Administered 2022-01-28 – 2022-01-29 (×3): 80 mg via ORAL
  Filled 2022-01-28 (×3): qty 2

## 2022-01-28 MED ORDER — GABAPENTIN 300 MG PO CAPS: 300.0000 mg | ORAL_CAPSULE | Freq: Three times a day (TID) | ORAL | Status: DC

## 2022-01-28 MED ORDER — IPRATROPIUM-ALBUTEROL 0.5-2.5 (3) MG/3ML IN SOLN
3.0000 mL | Freq: Four times a day (QID) | RESPIRATORY_TRACT | Status: DC
Start: 2022-01-28 — End: 2022-01-29
  Administered 2022-01-28 – 2022-01-29 (×5): 3 mL via RESPIRATORY_TRACT
  Filled 2022-01-28 (×4): qty 3

## 2022-01-28 MED ORDER — INSULIN DETEMIR 100 UNIT/ML ~~LOC~~ SOLN
50.0000 [IU] | Freq: Two times a day (BID) | SUBCUTANEOUS | Status: DC
  Filled 2022-01-28: qty 0.5

## 2022-01-28 MED ORDER — INSULIN ASPART 100 UNIT/ML IJ SOLN
10.0000 [IU] | Freq: Three times a day (TID) | INTRAMUSCULAR | Status: DC
  Administered 2022-01-28 – 2022-01-29 (×3): 10 [IU] via SUBCUTANEOUS
  Filled 2022-01-28 (×2): qty 1

## 2022-01-28 MED ORDER — INSULIN DETEMIR 100 UNIT/ML ~~LOC~~ SOLN
25.0000 [IU] | Freq: Two times a day (BID) | SUBCUTANEOUS | Status: DC
Start: 2022-01-28 — End: 2022-01-29
  Administered 2022-01-28 – 2022-01-29 (×3): 25 [IU] via SUBCUTANEOUS
  Filled 2022-01-28 (×4): qty 0.25

## 2022-01-28 MED ORDER — SPIRONOLACTONE 25 MG PO TABS
12.5000 mg | ORAL_TABLET | Freq: Every day | ORAL | Status: DC
  Administered 2022-01-28 – 2022-01-29 (×2): 12.5 mg via ORAL
  Filled 2022-01-28 (×2): qty 0.5
  Filled 2022-01-28 (×2): qty 1

## 2022-01-28 NOTE — Progress Notes (Addendum)
PROGRESS NOTE    Cody Alexander  [id number] DOB: 1956-06-09 DOA: 01/27/2022 PCP: Rutherford Limerick, PA  Outpatient Specialists: cardiology    Brief Narrative:   From admission h and p Cody Alexander is a 66 y.o. male with medical history significant of dCHF, HTN, HLD, DM, COPD on 3 L oxygen, CAD, non-STEMI, morbid obesity with BMI 40.5, OSA not on CPAP, BPH, who presents with shortness breath, chest pain, bilateral leg weakness and fall.   Patient states that he has shortness of breath in the past several days, which has been progressively worsening.  Patient has cough with little mucus production.  No fever or chills.  He states that he had some chest pain earlier, which has resolved.  Patient has worsening bilateral lower leg edema.  Currently no active chest pain.  No nausea, vomiting, diarrhea or abdominal pain.  No symptoms of UTI.  Patient states that he has bilateral leg weakness for more than 3 days, associated with decreased sensation.  Denies back pain.  Patient states that he fell out of the chair 2 days ago. He is not sure if he lost consciousness.  He states that he hit his head.  Currently no headache or neck pain.   Assessment & Plan:   Principal Problem:   Acute on chronic respiratory failure with hypoxia (HCC) Active Problems:   COPD with acute exacerbation (HCC)   Type 2 diabetes mellitus with hyperglycemia, with long-term current use of insulin (HCC)   Essential hypertension   Obesity, Class III, BMI 40-49.9 (morbid obesity) (HCC)   Acute on chronic diastolic CHF (congestive heart failure) (HCC)   Elevated troponin   HLD (hyperlipidemia)   CAD (coronary artery disease)   Fall at home, initial encounter   Leg weakness, bilateral  # COPD with acute exacerbation Wheezing on exam with cough and dyspnea, improving with steroids - continue steroids and duonebs - home dulera  # Acute on chronic respiratory failure with hypoxia Dyspneic on arrival, now  resolved, stable on home 3 L O2 - continue [place] O2  # HFpEF Preserved EF on TTE last year. Here with edema on arrival, bnp wnl, treated with IV lasix. Today appears relatively compensated - resume home oral lasix 80 bid - cont home imdur, metoprolol, spironolactone  # Falls at home # [facility] Neuro consulted, thinks could be 2/2 neurontin dose - gabapentin on hold - EEG ordered per neuro - pt/ot consulted, may need snf  # HTN Here bp wnl - cont home amlodipine, atorvastatin, lisinopril  # T2DM Here glucose elevated. A1c elevated - stop semglee 25, resume home levemir and novolog at reduced doses - dm educator following - SSI  # CAD Chest pain resolved. Initial troponin elevated to 108, repeat 106. - monitor - cont home statin - start aspirin, doesn't appear to be on as outpt and I don't see a contraindication   # Morbid obesity Complicates care    DVT prophylaxis: lovenox Code Status: full Family Communication: none @ bedside  Level of care: Progressive Status is: Inpatient Remains inpatient appropriate because: need for ongoing inpatient evaluation (EEG today)    Consultants:  neurology  Procedures: none  Antimicrobials:  none    Subjective: This morning says breathing much improved. No chest pain. No cough  Objective: Vitals:   01/27/22 2342 01/28/22 0337 01/28/22 0425 01/28/22 0600  BP: 124/70 110/70    Pulse: 85 79    Resp: 18 16    Temp: 98.3 F (36.8 C) 98.4 F (  36.9 C)    TempSrc:      SpO2: 94% 96% 95%   Weight:    (!) 143.8 kg  Height:        Intake/Output Summary (Last 24 hours) at 01/28/2022 0803 Last data filed at 01/28/2022 0100 Gross per 24 hour  Intake 240 ml  Output 2150 ml  Net -1910 ml   Filed Weights   01/27/22 0806 01/28/22 0600  Weight: (!) 139.3 kg (!) 143.8 kg    Examination:  General exam: Appears calm and comfortable, chronically ill appearing Respiratory system: scattered wheeze and rhonchi Cardiovascular  system: S1 & S2 heard, RRR. No JVD, murmurs, rubs, gallops or clicks.  Gastrointestinal system: Abdomen is obese, soft and nontender. No organomegaly or masses felt. Normal bowel sounds heard. Central nervous system: Alert and oriented. [facility] Extremities: Symmetric 5 x 5 power. 1+ pitting edema Skin: No rashes, lesions or ulcers Psychiatry: calm    Data Reviewed: I have personally reviewed following labs and imaging studies  CBC: Recent Labs  Lab 01/27/22 0812  WBC 8.1  NEUTROABS 5.5  HGB 13.4  HCT 44.6  MCV 95.9  PLT 0000000   Basic Metabolic Panel: Recent Labs  Lab 01/27/22 0812 01/28/22 0310  NA 139 139  K 4.3 4.9  CL 102 97*  CO2 36* 37*  GLUCOSE 88 309*  BUN 11 19  CREATININE 0.83 1.11  CALCIUM 8.6* 8.5*  MG 2.3 2.2   GFR: Estimated Creatinine Clearance: 99 mL/min (by C-G formula based on SCr of 1.11 mg/dL). Liver Function Tests: Recent Labs  Lab 01/27/22 0812  AST 16  ALT 23  ALKPHOS 60  BILITOT 0.6  PROT 7.0  ALBUMIN 3.6   No results for input(s): LIPASE, AMYLASE in the last 168 hours. Recent Labs  Lab 01/28/22 0310  AMMONIA 39*   Coagulation Profile: No results for input(s): INR, PROTIME in the last 168 hours. Cardiac Enzymes: No results for input(s): CKTOTAL, CKMB, CKMBINDEX, TROPONINI in the last 168 hours. BNP (last 3 results) No results for input(s): PROBNP in the last 8760 hours. HbA1C: Recent Labs    01/27/22 0955  HGBA1C 10.2*   CBG: Recent Labs  Lab 01/27/22 1116 01/27/22 1709 01/27/22 2105  GLUCAP 128* 391* 410*   Lipid Profile: Recent Labs    01/28/22 0310  CHOL 148  HDL 39*  LDLCALC 93  TRIG 82  CHOLHDL 3.8   Thyroid Function Tests: No results for input(s): TSH, T4TOTAL, FREET4, T3FREE, THYROIDAB in the last 72 hours. Anemia Panel: Recent Labs    01/27/22 1507  VITAMINB12 235   Urine analysis:    Component Value Date/Time   COLORURINE YELLOW (A) 01/07/2021 1612   APPEARANCEUR CLEAR (A) 01/07/2021 1612    APPEARANCEUR Clear 08/20/2013 0849   LABSPEC 1.012 01/07/2021 1612   LABSPEC 1.014 08/20/2013 0849   PHURINE 7.0 01/07/2021 1612   GLUCOSEU 50 (A) 01/07/2021 1612   GLUCOSEU Negative 08/20/2013 0849   HGBUR SMALL (A) 01/07/2021 1612   BILIRUBINUR NEGATIVE 01/07/2021 1612   BILIRUBINUR Negative 08/20/2013 0849   KETONESUR NEGATIVE 01/07/2021 1612   PROTEINUR NEGATIVE 01/07/2021 1612   NITRITE NEGATIVE 01/07/2021 1612   LEUKOCYTESUR NEGATIVE 01/07/2021 1612   LEUKOCYTESUR Negative 08/20/2013 0849   Sepsis Labs: @LABRCNTIP (procalcitonin:4,lacticidven:4)  ) Recent Results (from the past 240 hour(s))  SARS Coronavirus 2 by RT PCR (hospital order, performed in [facility] lab) *cepheid single result test* Anterior Nasal Swab     Status: None   Collection Time:  01/27/22  8:12 AM   Specimen: Anterior Nasal Swab  Result Value Ref Range Status   SARS Coronavirus 2 by RT PCR NEGATIVE NEGATIVE Final    Comment: (NOTE) SARS-CoV-2 target nucleic acids are NOT DETECTED.  The SARS-CoV-2 RNA is generally detectable in upper and lower respiratory specimens during the acute phase of infection. The lowest concentration of SARS-CoV-2 viral copies this assay can detect is 250 copies / mL. A negative result does not preclude SARS-CoV-2 infection and should not be used as the sole basis for treatment or other patient management decisions.  A negative result may occur with improper specimen collection / handling, submission of specimen other than nasopharyngeal swab, presence of viral mutation(s) within the areas targeted by this assay, and inadequate number of viral copies (<250 copies / mL). A negative result must be combined with clinical observations, patient history, and epidemiological information.  Fact Sheet for Patients:   https://www.patel.info/  Fact Sheet for Healthcare Providers: https://hall.com/  This test is not yet approved  or  cleared by the [place] FDA and has been authorized for detection and/or diagnosis of SARS-CoV-2 by FDA under an Emergency Use Authorization (EUA).  This EUA will remain in effect (meaning this test can be used) for the duration of the COVID-19 declaration under Section 564(b)(1) of the Act, 21 U.S.C. section 360bbb-3(b)(1), unless the authorization is terminated or revoked sooner.  Performed at [facility], [address], [place], [place] [address]          Radiology Studies: DG Chest 2 View  Result Date: 01/27/2022 CLINICAL DATA:  66 year old male with history of chest pain. EXAM: CHEST - 2 VIEW COMPARISON:  Chest x-ray 08/15/2021. FINDINGS: Images under penetrated limiting the diagnostic sensitivity and specificity of the examination. With these limitations in mind, lung volumes are low. No consolidative airspace disease. No pleural effusions. No pneumothorax. No pulmonary nodule or mass noted. Pulmonary vasculature and the cardiomediastinal silhouette are within normal limits. Atherosclerotic calcifications are noted in the thoracic aorta. IMPRESSION: 1. Low lung volumes without radiographic evidence of acute cardiopulmonary disease. 2. Aortic atherosclerosis. Electronically Signed   By: Vinnie Langton M.D.   On: 01/27/2022 08:55   CT HEAD WO CONTRAST (5MM)  Result Date: 01/27/2022 CLINICAL DATA:  Chest pain, head trauma EXAM: CT HEAD WITHOUT CONTRAST TECHNIQUE: Contiguous axial images were obtained from the base of the skull through the vertex without intravenous contrast. RADIATION DOSE REDUCTION: This exam was performed according to the departmental dose-optimization program which includes automated exposure control, adjustment of the mA and/or kV according to patient size and/or use of iterative reconstruction technique. COMPARISON:  04/21/2021 CT maxillofacial, 03/13/2020 CT head FINDINGS: Brain: No evidence of acute infarction, hemorrhage, cerebral edema, mass,  mass effect, or midline shift. No hydrocephalus or extra-axial fluid collection. Vascular: No hyperdense vessel. Skull: Normal. Negative for fracture or focal lesion. Sinuses/Orbits: Mild mucosal thickening in the ethmoid air cells. The orbits are unremarkable. Other: The mastoid air cells are well aerated. IMPRESSION: No acute intracranial process. Electronically Signed   By: Merilyn Baba M.D.   On: 01/27/2022 11:26        Scheduled Meds:  amLODipine  10 mg Oral Daily   aspirin EC  81 mg Oral Daily   atorvastatin  40 mg Oral Daily   enoxaparin (LOVENOX) injection  0.5 mg/kg Subcutaneous Q24H   furosemide  60 mg Intravenous Q12H   gabapentin  600 mg Oral TID   insulin aspart  0-5 Units Subcutaneous  QHS   insulin aspart  0-9 Units Subcutaneous TID WC   insulin glargine-yfgn  25 Units Subcutaneous QHS   ipratropium-albuterol  3 mL Nebulization Q4H   isosorbide dinitrate  30 mg Oral BID   methylPREDNISolone (SOLU-MEDROL) injection  80 mg Intravenous Daily   metoprolol tartrate  25 mg Oral BID   mometasone-formoterol  2 puff Inhalation BID   Continuous Infusions:   LOS: 1 day     Desma Maxim, MD [facility]   If 7PM-7AM, please contact night-coverage [email or web address] Password [email or web address] 01/28/2022, 8:03 AM

## 2022-01-28 NOTE — Evaluation (Signed)
Occupational Therapy Evaluation Patient Details Name: Cody Alexander MRN: [id number] DOB: 1956-04-22 Today's Date: 01/28/2022   History of Present Illness Cody Alexander is a 66 y.o. male  here with chest pain. Pt reports that over the past few days, he has had progressively worsening leg swelling, weakness, and SOB. He has had difficulty even getting out of his chair and has been essentially unable to support himself.   Clinical Impression   Pt was seen for OT evaluation this date. Prior to hospital admission, pt was independent with ADL and IADL, except endorses that he does occasionally skip medications depending on how he's feeling. Pt educated in medication safety and encouraged him to speak with his MD prior to any medication changes. Pt verbalized understanding. Pt lives with a roommate who he assists with IADL tasks such as trash, yard work. Currently pt demonstrates impairments as described below (See OT problem list) which functionally limit his ability to perform ADL/self-care tasks. Pt currently requires MOD A for LB dressing when involving ADL transfers, increased difficulty with managing socks while seated, and requires CGA +2 to MIN A +2 for safety with ADL transfers with VC for hand placement and appropriate bari RW mgt. Pt noted to have significant clonus when in standing, in all limbs, limiting his standing tolerance and safety for mobility. Pt would benefit from skilled OT services to address noted impairments and functional limitations (see below for any additional details) in order to maximize safety and independence while minimizing falls risk and caregiver burden. Upon hospital discharge, recommend STR to maximize pt safety and return to PLOF. RN notified of rash on pt's buttocks/low back and itchy per pt report.    Recommendations for follow up therapy are one component of a multi-disciplinary discharge planning process, led by the attending physician.  Recommendations may be  updated based on patient status, additional functional criteria and insurance authorization.   Follow Up Recommendations  Skilled nursing-short term rehab (<3 hours/day)    Assistance Recommended at Discharge Frequent or constant Supervision/Assistance  Patient can return home with the following Two people to help with walking and/or transfers;Two people to help with bathing/dressing/bathroom;Assistance with cooking/housework;Assist for transportation;Help with stairs or ramp for entrance;Direct supervision/assist for medications management    Functional Status Assessment  Patient has had a recent decline in their functional status and demonstrates the ability to make significant improvements in function in a reasonable and predictable amount of time.  Equipment Recommendations  Other (comment);BSC/3in1 (bariatric BSC, bariatric RW)    Recommendations for Other Services       Precautions / Restrictions Precautions Precautions: Fall Precaution Comments: Demonstrates clonus in WB positions (i.e. standing) Restrictions Weight Bearing Restrictions: No      Mobility Bed Mobility               General bed mobility comments: NT. In recliner pre and post session Patient Response: Cooperative  Transfers Overall transfer level: Needs assistance Equipment used: Rolling walker (2 wheels) (bariatric) Transfers: Sit to/from Stand Sit to Stand: Min guard, Min assist, +2 safety/equipment           General transfer comment: Initially requiring minA +2 to stand from recliner. On further attempts, CGA+2, VC for hand placement and RW mgt      Balance Overall balance assessment: Needs assistance Sitting-balance support: No upper extremity supported, Feet supported Sitting balance-Leahy Scale: Good     Standing balance support: Bilateral upper extremity supported, During functional activity, Reliant on assistive device for balance Standing balance-Leahy  Scale: Poor Standing  balance comment: heavily reliant on RW                           ADL either performed or assessed with clinical judgement   ADL Overall ADL's : Needs assistance/impaired                                       General ADL Comments: Pt able to manage socks in sitting using figure 4 technique but did demonstrate difficulty and required increased time/effort to complete. MOD A for LB dressing requiring ADL transfters due to poor balance, clonus, and unable to let go of RW to complete dressing over hips. CGA-MIN A +2 for ADL transfers with bari RW     Vision         Perception     Praxis      Pertinent Vitals/Pain Pain Assessment Pain Assessment: No/denies pain     Hand Dominance     Extremity/Trunk Assessment Upper Extremity Assessment Upper Extremity Assessment: Generalized weakness;RUE deficits/detail;LUE deficits/detail RUE Deficits / Details: clonus LUE Deficits / Details: clonus   Lower Extremity Assessment Lower Extremity Assessment: Generalized weakness;RLE deficits/detail;LLE deficits/detail RLE Deficits / Details: clonus in standing LLE Deficits / Details: clonus in standing   Cervical / Trunk Assessment Cervical / Trunk Assessment: Normal   Communication Communication Communication: No difficulties   Cognition Arousal/Alertness: Awake/alert Behavior During Therapy: WFL for tasks assessed/performed Overall Cognitive Status: Within Functional Limits for tasks assessed                                       General Comments       Exercises Other Exercises Other Exercises: Pt instructed in ADL transfers with RW mgt   Shoulder Instructions      Home Living Family/patient expects to be discharged to:: Private residence Living Arrangements: Non-relatives/Friends Available Help at Discharge: Family;Available PRN/intermittently Type of Home: House Home Access: Stairs to enter [employer] of Steps:  3 Entrance Stairs-Rails: Left Home Layout: One level     Bathroom Shower/Tub: Chief Strategy Officer: Standard Bathroom Accessibility: No   Home Equipment: Cane - single point   Additional Comments: Home O2      Prior Functioning/Environment Prior Level of Function : Independent/Modified Independent             Mobility Comments: uses cane PRN ADLs Comments: Indep with ADL/IADL, endorses "sometimes" not taking medications when he feels like it        OT Problem List: Decreased coordination;Decreased strength;Decreased safety awareness;Impaired balance (sitting and/or standing);Decreased activity tolerance;Decreased knowledge of use of DME or AE;Obesity      OT Treatment/Interventions: Self-care/ADL training;Therapeutic exercise;Therapeutic activities;DME and/or AE instruction;Patient/family education;Balance training    OT Goals(Current goals can be found in the care plan section) Acute Rehab OT Goals Patient Stated Goal: get better and go back to mobile home with roommate OT Goal Formulation: With patient Time For Goal Achievement: 02/11/22 Potential to Achieve Goals: Good ADL Goals Pt Will Perform Lower Body Dressing: with set-up;with supervision;sit to/from stand Pt Will Transfer to Toilet: with min assist;stand pivot transfer;bedside commode Pt Will Perform Toileting - Clothing Manipulation and hygiene: with modified independence;sitting/lateral leans Additional ADL Goal #1: Pt will verbalize plan to implement at  least 1 learned falls prevention strategy into daily ADL/IADL routine.  OT Frequency: Min 2X/week    Co-evaluation PT/OT/SLP Co-Evaluation/Treatment: Yes Reason for Co-Treatment: Complexity of the patient's impairments (multi-system involvement);For patient/therapist safety;To address functional/ADL transfers PT goals addressed during session: Mobility/safety with mobility;Balance;Proper use of DME;Strengthening/ROM OT goals addressed during  session: ADL's and self-care;Proper use of Adaptive equipment and DME      AM-PAC OT "6 Clicks" Daily Activity     Outcome Measure Help from another person eating meals?: None Help from another person taking care of personal grooming?: A Little Help from another person toileting, which includes using toliet, bedpan, or urinal?: A Lot Help from another person bathing (including washing, rinsing, drying)?: A Lot Help from another person to put on and taking off regular upper body clothing?: A Little Help from another person to put on and taking off regular lower body clothing?: A Lot 6 Click Score: 16   End of Session Equipment Utilized During Treatment: Gait belt;Oxygen;Rolling walker (2 wheels) Nurse Communication: Mobility status;Other (comment) (back/buttocks itchy, purewick displaced)  Activity Tolerance: Patient tolerated treatment well Patient left: in chair;with call bell/phone within reach  OT Visit Diagnosis: Other abnormalities of gait and mobility (R26.89);History of falling (Z91.81);Muscle weakness (generalized) (M62.81)                Time: 1030-1047 OT Time Calculation (min): 17 min Charges:  OT General Charges $OT Visit: 1 Visit OT Evaluation $OT Eval Moderate Complexity: 1 Mod  Arman FilterJamie R., MPH, MS, OTR/L ascom [phone] 01/28/22, 12:25 PM

## 2022-01-28 NOTE — Evaluation (Signed)
Physical Therapy Evaluation Patient Details Name: Cody Alexander MRN: [id number] DOB: 07/07/1956 Today's Date: 01/28/2022  History of Present Illness  Cody Alexander is a 66 y.o. male  here with chest pain. Pt reports that over the past few days, he has had progressively worsening leg swelling, weakness, and SOB. He has had difficulty even getting out of his chair and has been essentially unable to support himself.   Clinical Impression  Pt admitted with above diagnosis. Pt received upright in recliner agreeable to PT/OT co-eval. At baseline, pt endorses living with a friend of 89 y.o. she typically helps care for. Typically he is indep to mod-I with intermittent SPC with ADL's/IADL's. Reports still driving although he is supposedly not supposed to be. Pt displaying normal sensation to LT and grossly 3+5 MMT in hip flexion, knee extension and ankle DF. Initially minA+2 to stand to RW with reproduction of asterixis in standing at BRW leading to jerking of LE's and Ue's limiting pt's standing tolerance < 20 sec before requesting to have a seat. On second attempt pt able to with minguard +2 and improve LE asterixis with locking out knees but still limited in safe ability to trial gait at this time. Pt unsafe at this time to return to current home living environment. All needs in reach with RN updated via secure caht on pt reports of rash and itching on back and buttocks. Pt currently with functional limitations due to the deficits listed below (see PT Problem List). Pt will benefit from skilled PT to increase their independence and safety with mobility to allow discharge to the venue listed below.      Recommendations for follow up therapy are one component of a multi-disciplinary discharge planning process, led by the attending physician.  Recommendations may be updated based on patient status, additional functional criteria and insurance authorization.  Follow Up Recommendations Skilled nursing-short  term rehab (<3 hours/day)    Assistance Recommended at Discharge Intermittent Supervision/Assistance  Patient can return home with the following  Two people to help with walking and/or transfers;Two people to help with bathing/dressing/bathroom;Help with stairs or ramp for entrance    Equipment Recommendations Other (comment) (tbd by next venue of care)  Recommendations for Other Services       Functional Status Assessment Patient has had a recent decline in their functional status and demonstrates the ability to make significant improvements in function in a reasonable and predictable amount of time.     Precautions / Restrictions Precautions Precautions: Fall Precaution Comments: Demonstrates clonus in WB positions (i.e. standing) Restrictions Weight Bearing Restrictions: No      Mobility  Bed Mobility               General bed mobility comments: NT. In recliner pre and post session Patient Response: Cooperative  Transfers Overall transfer level: Needs assistance Equipment used: Rolling walker (2 wheels) (bariatric) Transfers: Sit to/from Stand Sit to Stand: Min guard, Min assist, +2 safety/equipment           General transfer comment: Initially requiring minA +2 to stand from recliner. On further attempts, CGA+2    Ambulation/Gait               General Gait Details: deferred due to difficulty maintaining static standing with clonus  Stairs            Wheelchair Mobility    Modified Rankin (Stroke Patients Only)       Balance Overall balance assessment: Needs assistance Sitting-balance support: No  upper extremity supported, Feet supported Sitting balance-Leahy Scale: Good     Standing balance support: Bilateral upper extremity supported, During functional activity, Reliant on assistive device for balance Standing balance-Leahy Scale: Poor                               Pertinent Vitals/Pain Pain Assessment Pain  Assessment: No/denies pain    Home Living Family/patient expects to be discharged to:: Private residence Living Arrangements: Non-relatives/Friends Available Help at Discharge: Family;Available PRN/intermittently Type of Home: House Home Access: Stairs to enter Entrance Stairs-Rails: Left Entrance Stairs-Number of Steps: 3   Home Layout: One level Home Equipment: Cane - single point Additional Comments: Home O2    Prior Function Prior Level of Function : Independent/Modified Independent             Mobility Comments: uses cane PRN       Hand Dominance        Extremity/Trunk Assessment   Upper Extremity Assessment Upper Extremity Assessment: Generalized weakness;RUE deficits/detail;LUE deficits/detail RUE Deficits / Details: clonus LUE Deficits / Details: clonus    Lower Extremity Assessment Lower Extremity Assessment: Generalized weakness;RLE deficits/detail;LLE deficits/detail RLE Deficits / Details: clonus in standing LLE Deficits / Details: clonus in standing    Cervical / Trunk Assessment Cervical / Trunk Assessment: Normal  Communication   Communication: No difficulties  Cognition Arousal/Alertness: Awake/alert Behavior During Therapy: WFL for tasks assessed/performed Overall Cognitive Status: Within Functional Limits for tasks assessed                                          General Comments      Exercises Other Exercises Other Exercises: Role of PT in acute setting, d/c recs, safe use of DME   Assessment/Plan    PT Assessment Patient needs continued PT services  PT Problem List Decreased strength;Decreased mobility;Decreased safety awareness;Decreased activity tolerance;Decreased balance       PT Treatment Interventions DME instruction;Therapeutic exercise;Gait training;Balance training;Stair training;Neuromuscular re-education;Functional mobility training;Therapeutic activities;Patient/family education    PT Goals  (Current goals can be found in the Care Plan section)  Acute Rehab PT Goals Patient Stated Goal: to improve standing and walking abilities PT Goal Formulation: With patient Time For Goal Achievement: 02/11/22 Potential to Achieve Goals: Fair    Frequency Min 2X/week     Co-evaluation PT/OT/SLP Co-Evaluation/Treatment: Yes Reason for Co-Treatment: Complexity of the patient's impairments (multi-system involvement);For patient/therapist safety;To address functional/ADL transfers PT goals addressed during session: Mobility/safety with mobility;Balance;Proper use of DME;Strengthening/ROM OT goals addressed during session: ADL's and self-care;Proper use of Adaptive equipment and DME       AM-PAC PT "6 Clicks" Mobility  Outcome Measure Help needed turning from your back to your side while in a flat bed without using bedrails?: A Little Help needed moving from lying on your back to sitting on the side of a flat bed without using bedrails?: A Little Help needed moving to and from a bed to a chair (including a wheelchair)?: A Lot Help needed standing up from a chair using your arms (e.g., wheelchair or bedside chair)?: A Little Help needed to walk in hospital room?: Total Help needed climbing 3-5 steps with a railing? : Total 6 Click Score: 13    End of Session Equipment Utilized During Treatment: Gait belt Activity Tolerance: Treatment limited secondary to medical complications (  Comment) Patient left: in chair;with call bell/phone within reach;with chair alarm set Nurse Communication: Mobility status PT Visit Diagnosis: Unsteadiness on feet (R26.81);Muscle weakness (generalized) (M62.81);History of falling (Z91.81);Other symptoms and signs involving the nervous system (R29.898)    Time: 1030-1047 PT Time Calculation (min) (ACUTE ONLY): 17 min   Charges:   PT Evaluation $PT Eval Moderate Complexity: [place]field M. Fairly IV, PT, DPT Physical Therapist- [facility]  01/28/2022, 11:16 AM

## 2022-01-28 NOTE — Progress Notes (Signed)
Inpatient Diabetes Program Recommendations  AACE/ADA: New Consensus Statement on Inpatient Glycemic Control   Target Ranges:  Prepandial:   less than 140 mg/dL      Peak postprandial:   less than 180 mg/dL (1-2 hours)      Critically ill patients:  140 - 180 mg/dL    Latest Reference Range & Units 01/27/22 11:16 01/27/22 17:09 01/27/22 21:05 01/28/22 08:35  Glucose-Capillary 70 - 99 mg/dL 128 (H) 391 (H) 410 (H) 257 (H)    Latest Reference Range & Units 08/16/21 08:31 01/27/22 09:55  Hemoglobin A1C 4.8 - 5.6 % 13.0 (H) 10.2 (H)   Review of Glycemic Control  Diabetes history: DM2 Outpatient Diabetes medications: Levemir 50 units BID, Humalog 16 units TID with meals, Metformin 1000 mg BID Current orders for Inpatient glycemic control: Levemir 50 units BID, Novolog 16 units TID with meals, Novolog 0-9 units TID with meals, Novolog 0-5 units QHS; Prednisone 40 mg QAM  Inpatient Diabetes Program Recommendations:    Insulin: Noted patient received Semglee 25 units at 22:02 on 01/27/22 and fasting glucose 257 mg/dl today. Noted Semglee discontinued, Levemir 50 units BID and Novolog 16 units TID with meals ordered today. Please consider decreasing Levemir to 25 units BID and meal coverage to Novolog 10 units TID with meals.  HbgA1C: A1C 10.2% on 01/27/22 indicating an average glucose of 246 mg/dl over the past 2-3 months. Patient reports he forgets to take medications at times and also notes several episodes of hypoglycemia per week at home.  NOTE: Spoke with patient over the phone about diabetes and home regimen for diabetes control. Patient reports being followed by PCP for diabetes management and currently taking Levemir 50 units BID,Humalog 16 units TID with meals, and Metformin 1000 mg BID as an outpatient for diabetes control. Patient reports taking DM medications as prescribed and notes that he has not seen PCP in several weeks. Patient reports that he sometimes forgets to take medication.  Patient reports checking glucose 2-3 times per day and that it has been ranging from 50's mg/dl to mid 100's mg/dl.  Patient reports he has hypoglycemia several times a week.  Discussed A1C results (10.2% on 01/27/22) and explained that current A1C indicates an average glucose of 246 mg/dl over the past 2-3 months. Discussed glucose and A1C goals. Discussed importance of checking CBGs and maintaining good CBG control to prevent long-term and short-term complications. Patient states that transportation is an issue for him regarding following up with PCP. Patient states that he will likely need to go to rehab at discharge.  Discussed current insulin orders and that with steroids ordered are contributing to hyperglycemia.  Patient verbalized understanding of information discussed and reports no further questions at this time related to diabetes.  Thanks, Cody Alderman, RN, MSN, CDE Diabetes Coordinator Inpatient Diabetes Program [phone] (Team Pager)

## 2022-01-28 NOTE — Consult Note (Signed)
   Heart Failure Nurse Navigator Note  HFpEF 65-65 %. Moderate LVH.  Grade one diastolic dysfunction.  He presented to the ED with complaints of increasing shortness of breath, chest discomfort, worsening bilateral lower extremity edema and leg weakness.  BNP was 167.8.  Comorbidities:  Hypertension Hyperlipidemia Diabetes COPD Coronary artery disease/non-STEMI Morbid obesity Obstructive sleep apnea not on CPAP  Medications:  Amlodipine 10 mg daily Aspirin 81 mg daily Atorvastatin 40 mg daily Furosemide 80 mg by mouth 2 times a day Isosorbide mononitrate 30 mg 2 times a day Metoprolol tartrate 25 mg 2 times a day Spironolactone 12 and half milligrams daily  Labs:  Sodium 139, potassium 4.9, chloride 97, CO2 37, BUN 19, creatinine 1.11 Intake 240 mL Output 2150 mL Weight is 143.8 kg Blood pressure 100/65  Initial meeting with patient, he was sitting up in the chair at the bedside.  He complained of shaking of his arms and stuttering of his speech but it did clear as I spoke with him.  He states that he has a 56 year old roommate that is a grandmother of one of his friends.  He works part-time as a Games developer.  He states he is the one that does the meal preparation.  Discussed the importance of low sodium and foods to avoid.  Also discussed fluid restriction of any liquid should be less than 64 ounces in a 24-hour period.  Discussed the importance of daily weights, recording and what to report.  He states that he is not always compliant with taking his medications as he should.  Plan to discuss having his medications set up in a weekly medication planner.  He needed to be redirected several times throughout our meeting.  Made aware that he has an appointment in the outpatient heart failure clinic.  This is on June 12 at 4 PM.  He has a 20% no-show ratio 3 out of 15 appointments.  He was given the living with heart failure teaching booklet, zone magnet, information on  low-sodium and heart failure and weight chart.  Pricilla Riffle RN CHFN

## 2022-01-28 NOTE — TOC Initial Note (Signed)
Transition of Care [facility]) - Initial/Assessment Note    Patient Details  Name: Cody Alexander MRN: [id number] Date of Birth: 1955/12/23  Transition [facility] Care [facility]) CM/SW Contact:    Truddie Hidden, RN Phone Number: 01/28/2022, 5:04 PM  Clinical Narrative:                 \Per therapy. SNR rcommended. Patient is agreeable to SNF. Stated he did not hae a choice of facility.         Patient Goals and CMS Choice        Expected Discharge Plan and Services                                                Prior Living Arrangements/Services                       Activities of Daily Living Home Assistive Devices/Equipment: Dan Humphreys (specify type) ADL Screening (condition at time of admission) Patient's cognitive ability adequate to safely complete daily activities?: Yes Is the patient deaf or have difficulty hearing?: No Does the patient have difficulty seeing, even when wearing glasses/contacts?: No Does the patient have difficulty concentrating, remembering, or making decisions?: No Patient able to express need for assistance with ADLs?: Yes Does the patient have difficulty dressing or bathing?: No Independently performs ADLs?: Yes (appropriate for developmental age) Does the patient have difficulty walking or climbing stairs?: Yes Weakness of Legs: Both Weakness of Arms/Hands: None  Permission Sought/Granted                  Emotional Assessment              Admission diagnosis:  Acute on chronic diastolic CHF (congestive heart failure) (HCC) [I50.33] Patient Active Problem List   Diagnosis Date Noted   HLD (hyperlipidemia) 01/27/2022   CAD (coronary artery disease) 01/27/2022   Fall at home, initial encounter 01/27/2022   Leg weakness, bilateral 01/27/2022   Hyperkalemia    Acute CHF (congestive heart failure) (HCC) 08/15/2021   Benign prostatic hyperplasia with urinary hesitancy    Lower abdominal pain    Elevated troponin    Rash     Uncontrolled type 2 diabetes mellitus with hyperglycemia (HCC) 01/04/2021   Acute on chronic diastolic CHF (congestive heart failure) (HCC) 01/04/2021   CHF (congestive heart failure), NYHA class I, acute, diastolic (HCC) 01/04/2021   OSA (obstructive sleep apnea) 01/04/2021   Obesity (BMI 30-39.9) 01/04/2021   Chronic respiratory failure with hypoxia (HCC) 01/04/2021   Acute on chronic congestive heart failure (HCC)    Acute on chronic respiratory failure with hypoxia (HCC) 06/20/2020   NSTEMI (non-ST elevated myocardial infarction) (HCC) 02/25/2018   Obesity, Class III, BMI 40-49.9 (morbid obesity) (HCC) 01/19/2017   COPD with acute exacerbation (HCC) 05/13/2014   Type 2 diabetes mellitus with hyperglycemia, with long-term current use of insulin (HCC) 05/13/2014   Essential hypertension 05/13/2014   PCP:  Gildardo Pounds, PA Pharmacy:   [facility] - [place], [place] - [address] [place] [address] Phone: [phone] Fax: [phone]     Social Determinants of Health (SDOH) Interventions    Readmission Risk Interventions     View : No data to display.

## 2022-01-28 NOTE — Progress Notes (Signed)
Eeg done 

## 2022-01-29 LAB — BASIC METABOLIC PANEL
Anion gap: 5 (ref 5–15)
BUN: 18 mg/dL (ref 8–23)
CO2: 40 mmol/L — ABNORMAL HIGH (ref 22–32)
Calcium: 8.4 mg/dL — ABNORMAL LOW (ref 8.9–10.3)
Chloride: 94 mmol/L — ABNORMAL LOW (ref 98–111)
Creatinine, Ser: 1.05 mg/dL (ref 0.61–1.24)
GFR, Estimated: >60 mL/min (ref >60)
Glucose, Bld: 153 mg/dL — ABNORMAL HIGH (ref 70–99)
Potassium: 3.8 mmol/L (ref 3.5–5.1)
Sodium: 139 mmol/L (ref 135–145)

## 2022-01-29 LAB — GLUCOSE, CAPILLARY
Glucose-Capillary: 156 mg/dL — ABNORMAL HIGH (ref 70–99)
Glucose-Capillary: 209 mg/dL — ABNORMAL HIGH (ref 70–99)

## 2022-01-29 MED ORDER — PREDNISONE 20 MG PO TABS: 40.0000 mg | ORAL_TABLET | Freq: Every day | ORAL | 0 refills | Status: DC

## 2022-01-29 NOTE — Progress Notes (Signed)
Physical Therapy Treatment Patient Details Name: Cody Alexander MRN: [id number] DOB: 1956/04/21 Today's Date: 01/29/2022   History of Present Illness Cody Alexander is a 66 y.o. male  here with chest pain. Pt reports that over the past few days, he has had progressively worsening leg swelling, weakness, and SOB. He has had difficulty even getting out of his chair and has been essentially unable to support himself.    PT Comments    Pt improving in safe tolerance and progression in OOB mobility. Pt reporting absence of asterixis in standing today with ability to stand and ambulate with supervision and without AD. Chair follow provided for safety throughout gait.  Pt ambulating with mild unsteadiness with intermittent light touch of Ue's on hand rail in hallway and antalgic gait on LLE but pt reports this is baseline as pt has history of musculoskeletal injuries at knee and ankle on L side. Pt maintaining balance and stability with no observable asterixis in Ue's/LE's. Pt returning to room performing standing balance tasks with reduced BOS and removing vision leading to posterior bias with intermittent minA needed to maintain neutral upright posture. Most balance impairments today are with vestibular system but overall pt able to safely perform household distance walking at a low falls risk. Pt placed in recliner with all needs in reach. Updating d/c recs to home with HHPT for addressing minor balance deficits to return to PLOF and to assist in safe transition back to home environment.   Recommendations for follow up therapy are one component of a multi-disciplinary discharge planning process, led by the attending physician.  Recommendations may be updated based on patient status, additional functional criteria and insurance authorization.  Follow Up Recommendations  Home health PT     Assistance Recommended at Discharge Intermittent Supervision/Assistance  Patient can return home with the  following Help with stairs or ramp for entrance   Equipment Recommendations  None recommended by PT    Recommendations for Other Services       Precautions / Restrictions Precautions Precautions: Fall Restrictions Weight Bearing Restrictions: No     Mobility  Bed Mobility               General bed mobility comments: NT. In recliner pre and post session Patient Response: Cooperative  Transfers Overall transfer level: Needs assistance Equipment used: None Transfers: Sit to/from Stand Sit to Stand: Supervision                Ambulation/Gait Ambulation/Gait assistance: Supervision Gait Distance (Feet): 120 Feet Assistive device: None Gait Pattern/deviations: Step-through pattern, Decreased step length - left, Decreased step length - right, Decreased stance time - left       General Gait Details: Decreased stance on LLE due to chronic knee issues. No asterixis noted in WB positions. Mild unsteadiness with gait but no overt LOB   Stairs             Wheelchair Mobility    Modified Rankin (Stroke Patients Only)       Balance Overall balance assessment: Needs assistance Sitting-balance support: No upper extremity supported, Feet supported Sitting balance-Leahy Scale: Good     Standing balance support: Bilateral upper extremity supported, During functional activity, Reliant on assistive device for balance Standing balance-Leahy Scale: Fair Standing balance comment: Maintains static standing without AD               High Level Balance Comments: Feet together EO/EC: 15 sec each; tandem stance RLE/LLE under BOS and SUE support: <  10 sec each LE; normal BOS EC for 15 sec            Cognition Arousal/Alertness: Awake/alert Behavior During Therapy: WFL for tasks assessed/performed Overall Cognitive Status: Within Functional Limits for tasks assessed                                          Exercises      General  Comments General comments (skin integrity, edema, etc.): Posterior bias with all balance exercises. Increased posterior bias and imbalance with narrowed BOS and eyes closed requiring minA to correct indicating greater balance deficits in balance to vestibular system.      Pertinent Vitals/Pain Pain Assessment Pain Assessment: No/denies pain    Home Living                          Prior Function            PT Goals (current goals can now be found in the care plan section) Acute Rehab PT Goals Patient Stated Goal: to improve standing and walking abilities PT Goal Formulation: With patient Time For Goal Achievement: 02/11/22 Potential to Achieve Goals: Good Progress towards PT goals: Progressing toward goals    Frequency    Min 2X/week      PT Plan Discharge plan needs to be updated    Co-evaluation              AM-PAC PT "6 Clicks" Mobility   Outcome Measure  Help needed turning from your back to your side while in a flat bed without using bedrails?: A Little Help needed moving from lying on your back to sitting on the side of a flat bed without using bedrails?: A Little Help needed moving to and from a bed to a chair (including a wheelchair)?: A Little Help needed standing up from a chair using your arms (e.g., wheelchair or bedside chair)?: None Help needed to walk in hospital room?: A Little Help needed climbing 3-5 steps with a railing? : A Little 6 Click Score: 19    End of Session Equipment Utilized During Treatment: Gait belt;Oxygen Activity Tolerance: Patient tolerated treatment well Patient left: in chair;with call bell/phone within reach Nurse Communication: Mobility status PT Visit Diagnosis: Unsteadiness on feet (R26.81);Muscle weakness (generalized) (M62.81);History of falling (Z91.81);Other symptoms and signs involving the nervous system (Y10.175)     Time: 1025-8527 PT Time Calculation (min) (ACUTE ONLY): 11 min  Charges:   $Neuromuscular Re-education: 8-22 mins                     Delphia Grates. Fairly IV, PT, DPT Physical Therapist- [place]  [facility]  01/29/2022, 11:47 AM

## 2022-01-29 NOTE — Progress Notes (Signed)
   01/29/22 1600  Clinical Encounter Type  Visited With Patient  Visit Type Initial  Referral From Nurse  Consult/Referral To Chaplain   Chaplain responded to nurse consult. Chaplain provided compassionate non-anxious presence as patient spoke about hospital stay. Chaplain provided reflective listening as patient discussed his faith and beliefs. Chaplain provided prayer as requested.

## 2022-01-29 NOTE — TOC Progression Note (Addendum)
Transition of Care [facility]) - Progression Note    Patient Details  Name: Cody Alexander MRN: [id number] Date of Birth: 12/21/1955  Transition [facility] Care [facility]) CM/SW Contact  Truddie Hidden, RN Phone Number: 01/29/2022, 1:22 PM  Clinical Narrative:    Spoke with patient regarding discharge plan and new recommendation for HHPT. Patient agreeable. He does not have a preference of HH agencies. Referral sent to [facility]. Referral accepted by [facility]. Patient notified of initiation of services          Expected Discharge Plan and Services           Expected Discharge Date: 01/29/22                                     Social Determinants of Health (SDOH) Interventions    Readmission Risk Interventions     View : No data to display.

## 2022-01-29 NOTE — Procedures (Signed)
Routine EEG Report  Cody Alexander is a 66 y.o. male with a history of jerking spells who is undergoing an EEG to evaluate for seizures.  Report: This EEG was acquired with electrodes placed according to the International 10-20 electrode system (including Fp1, Fp2, F3, F4, C3, C4, P3, P4, O1, O2, T3, T4, T5, T6, A1, A2, Fz, Cz, Pz). The following electrodes were missing or displaced: none.  The occipital dominant rhythm was 8.5 Hz. This activity is reactive to stimulation. Drowsiness was manifested by background fragmentation; deeper stages of sleep were not identified. There was no focal slowing. There were no interictal epileptiform discharges. There were no electrographic seizures identified. There was no abnormal response to photic stimulation or hyperventilation.   Impression: This EEG was obtained while awake and drowsy and is normal.    Clinical Correlation: Normal EEGs, however, do not rule out epilepsy.  Bing Neighbors, MD [facility] [phone]  If 7pm- 7am, please page neurology on call as listed in [facility].

## 2022-01-29 NOTE — Progress Notes (Addendum)
Discharge instructions (including medications) discussed with and copy provided to patient/caregiver. All belongings sent with patient. Cab voucher sent with patient per Case Manager.   [id number]: Still waiting on cab, called to confirm they are still in route.

## 2022-01-29 NOTE — Discharge Summary (Signed)
Cody Alexander [id number] DOB: 1956-05-27 DOA: 01/27/2022  PCP: Rutherford Limerick, PA  Admit date: 01/27/2022 Discharge date: 01/29/2022  Time spent: 40 minutes  Recommendations for Outpatient Follow-up:  Pcp f/u, needs to start cpap     Discharge Diagnoses:  Principal Problem:   Acute on chronic respiratory failure with hypoxia [facility]) Active Problems:   Acute on chronic diastolic CHF (congestive heart failure) (HCC)   COPD with acute exacerbation (HCC)   CAD (coronary artery disease)   Elevated troponin   Essential hypertension   Fall at home, initial encounter   Type 2 diabetes mellitus with hyperglycemia, with long-term current use of insulin (HCC)   HLD (hyperlipidemia)   Obesity, Class III, BMI 40-49.9 (morbid obesity) (HCC)   OSA (obstructive sleep apnea)   Leg weakness, bilateral   Discharge Condition: improved  Diet recommendation: heart healthy  Filed Weights   01/27/22 0806 01/28/22 0600 01/29/22 0500  Weight: (!) 139.3 kg (!) 143.8 kg (!) 141.2 kg    History of present illness:  From admission h and p Cody Alexander is a 66 y.o. male with medical history significant of dCHF, HTN, HLD, DM, COPD on 3 L oxygen, CAD, non-STEMI, morbid obesity with BMI 40.5, OSA not on CPAP, BPH, who presents with shortness breath, chest pain, bilateral leg weakness and fall.   Patient states that he has shortness of breath in the past several days, which has been progressively worsening.  Patient has cough with little mucus production.  No fever or chills.  He states that he had some chest pain earlier, which has resolved.  Patient has worsening bilateral lower leg edema.  Currently no active chest pain.  No nausea, vomiting, diarrhea or abdominal pain.  No symptoms of UTI.  Patient states that he has bilateral leg weakness for more than 3 days, associated with decreased sensation.  Denies back pain.  Patient states that he fell out of the chair 2 days ago. He is not sure if he lost  consciousness.  He states that he hit his head.  Currently no headache or neck pain.  Hospital Course:  Patient presented with difficulty ambulating and asterixis. These problems resolved spontaneously. Neuro evaluated. EEG negative. Hypercarbia (untreated OSA, possible OHS) the likely culprit. Patient strongly advised for pcp f/u, needs titration study for cpap. Also referred to pulmonology. Asterixis resolved and patient ambulated successfully with PT day of discharge. HH PT ordered. Patient presented with mild copd exacerbation treated with steroids with improvement in symptoms. Respiratory status stable on home 3 L [place] O2.   Procedures: EEG   Consultations: neurology  Discharge Exam: Vitals:   01/29/22 0837 01/29/22 1156  BP:  110/72  Pulse:  72  Resp:  17  Temp:  97.8 F (36.6 C)  SpO2: 94% 94%    General: NAD Cardiovascular: RRR, soft systolic murmur Respiratory: few scattered rhonchi, wheezing much improved Ext: trace LE edema  Discharge Instructions   Discharge Instructions     Ambulatory referral to Pulmonology   Complete by: As directed    Reason for referral: Other   Diet - low sodium heart healthy   Complete by: As directed    Face-to-face encounter (required for Medicare/Medicaid patients)   Complete by: As directed    I Desma Maxim certify that this patient is under my care and that I, or a nurse practitioner or physician's assistant working with me, had a face-to-face encounter that meets the physician face-to-face encounter requirements with this patient on 01/29/2022.  The encounter with the patient was in whole, or in part for the following medical condition(s) which is the primary reason for home health care (List medical condition): copd   The encounter with the patient was in whole, or in part, for the following medical condition, which is the primary reason for home health care: copd   I certify that, based on my findings, the following services are  medically necessary home health services: Physical therapy   Reason for Medically Necessary Home Health Services: Therapy- Therapeutic Exercises to Increase Strength and Endurance   My clinical findings support the need for the above services: Shortness of breath with activity   Further, I certify that my clinical findings support that this patient is homebound due to: Shortness of Breath with activity   Home Health   Complete by: As directed    To provide the following care/treatments: PT   Increase activity slowly   Complete by: As directed    No wound care   Complete by: As directed       Allergies as of 01/29/2022       Reactions   Erythromycin Anaphylaxis, Swelling   Had eye swelling with erythromycin during a time when he had perf ear drum. Tolerated azithromycin.        Medication List     STOP taking these medications    gabapentin 600 MG tablet Commonly known as: NEURONTIN       TAKE these medications    acetaminophen 500 MG tablet Commonly known as: TYLENOL Take 1,000 mg by mouth every 6 (six) hours as needed for mild pain or moderate pain.   amLODipine 10 MG tablet Commonly known as: NORVASC Take 10 mg by mouth daily.   atorvastatin 40 MG tablet Commonly known as: LIPITOR Take 40 mg by mouth daily.   budesonide-formoterol 160-4.5 MCG/ACT inhaler Commonly known as: SYMBICORT Inhale 2 puffs into the lungs 2 (two) times daily.   furosemide 80 MG tablet Commonly known as: LASIX Take 80 mg by mouth 2 (two) times daily.   insulin detemir 100 UNIT/ML FlexPen Commonly known as: LEVEMIR Inject 50 Units into the skin in the morning and at bedtime.   insulin lispro 100 UNIT/ML KwikPen Commonly known as: HUMALOG Inject 16 Units into the skin 3 (three) times daily with meals.   isosorbide dinitrate 30 MG tablet Commonly known as: ISORDIL Take 30 mg by mouth every morning.   lisinopril 20 MG tablet Commonly known as: ZESTRIL Take 20 mg by mouth  daily.   metFORMIN 1000 MG tablet Commonly known as: GLUCOPHAGE Take 1,000 mg by mouth 2 (two) times daily.   metoprolol tartrate 25 MG tablet Commonly known as: LOPRESSOR Take 25 mg by mouth 2 (two) times daily.   predniSONE 20 MG tablet Commonly known as: DELTASONE Take 2 tablets (40 mg total) by mouth daily with breakfast. Start taking on: January 30, 2022   ProAir HFA 108 (90 Base) MCG/ACT inhaler Generic drug: albuterol Inhale 2 puffs into the lungs every 4 (four) hours as needed for wheezing.   spironolactone 25 MG tablet Commonly known as: ALDACTONE Take 12.5 mg by mouth daily.       Allergies  Allergen Reactions   Erythromycin Anaphylaxis and Swelling    Had eye swelling with erythromycin during a time when he had perf ear drum.  Tolerated azithromycin.    Follow-up Information     August Luz A, PA Follow up.   Specialty: Physician Assistant Contact information: [phone]  [place] [id number] [phone]                  The results of significant diagnostics from this hospitalization (including imaging, microbiology, ancillary and laboratory) are listed below for reference.    Significant Diagnostic Studies: DG Chest 2 View  Result Date: 01/27/2022 CLINICAL DATA:  66 year old male with history of chest pain. EXAM: CHEST - 2 VIEW COMPARISON:  Chest x-ray 08/15/2021. FINDINGS: Images under penetrated limiting the diagnostic sensitivity and specificity of the examination. With these limitations in mind, lung volumes are low. No consolidative airspace disease. No pleural effusions. No pneumothorax. No pulmonary nodule or mass noted. Pulmonary vasculature and the cardiomediastinal silhouette are within normal limits. Atherosclerotic calcifications are noted in the thoracic aorta. IMPRESSION: 1. Low lung volumes without radiographic evidence of acute cardiopulmonary disease. 2. Aortic atherosclerosis. Electronically Signed   By: Vinnie Langton M.D.    On: 01/27/2022 08:55   CT HEAD WO CONTRAST (5MM)  Result Date: 01/27/2022 CLINICAL DATA:  Chest pain, head trauma EXAM: CT HEAD WITHOUT CONTRAST TECHNIQUE: Contiguous axial images were obtained from the base of the skull through the vertex without intravenous contrast. RADIATION DOSE REDUCTION: This exam was performed according to the departmental dose-optimization program which includes automated exposure control, adjustment of the mA and/or kV according to patient size and/or use of iterative reconstruction technique. COMPARISON:  04/21/2021 CT maxillofacial, 03/13/2020 CT head FINDINGS: Brain: No evidence of acute infarction, hemorrhage, cerebral edema, mass, mass effect, or midline shift. No hydrocephalus or extra-axial fluid collection. Vascular: No hyperdense vessel. Skull: Normal. Negative for fracture or focal lesion. Sinuses/Orbits: Mild mucosal thickening in the ethmoid air cells. The orbits are unremarkable. Other: The mastoid air cells are well aerated. IMPRESSION: No acute intracranial process. Electronically Signed   By: Merilyn Baba M.D.   On: 01/27/2022 11:26   EEG adult  Result Date: 01/29/2022 Derek Jack, MD     01/29/2022 12:51 PM Routine EEG Report Monserrat Linarez is a 66 y.o. male with a history of jerking spells who is undergoing an EEG to evaluate for seizures. Report: This EEG was acquired with electrodes placed according to the International 10-20 electrode system (including Fp1, Fp2, F3, F4, C3, C4, P3, P4, O1, O2, T3, T4, T5, T6, A1, A2, Fz, Cz, Pz). The following electrodes were missing or displaced: none. The occipital dominant rhythm was 8.5 Hz. This activity is reactive to stimulation. Drowsiness was manifested by background fragmentation; deeper stages of sleep were not identified. There was no focal slowing. There were no interictal epileptiform discharges. There were no electrographic seizures identified. There was no abnormal response to photic stimulation or  hyperventilation. Impression: This EEG was obtained while awake and drowsy and is normal.   Clinical Correlation: Normal EEGs, however, do not rule out epilepsy. Su Monks, MD [facility] [phone] If 7pm- 7am, please page neurology on call as listed in [place].    Microbiology: Recent Results (from the past 240 hour(s))  SARS Coronavirus 2 by RT PCR (hospital order, performed in [facility] hospital lab) *cepheid single result test* Anterior Nasal Swab     Status: None   Collection Time: 01/27/22  8:12 AM   Specimen: Anterior Nasal Swab  Result Value Ref Range Status   SARS Coronavirus 2 by RT PCR NEGATIVE NEGATIVE Final    Comment: (NOTE) SARS-CoV-2 target nucleic acids are NOT DETECTED.  The SARS-CoV-2 RNA is generally detectable in upper and lower respiratory specimens during the acute  phase of infection. The lowest concentration of SARS-CoV-2 viral copies this assay can detect is 250 copies / mL. A negative result does not preclude SARS-CoV-2 infection and should not be used as the sole basis for treatment or other patient management decisions.  A negative result may occur with improper specimen collection / handling, submission of specimen other than nasopharyngeal swab, presence of viral mutation(s) within the areas targeted by this assay, and inadequate number of viral copies (<250 copies / mL). A negative result must be combined with clinical observations, patient history, and epidemiological information.  Fact Sheet for Patients:   https://www.patel.info/  Fact Sheet for Healthcare Providers: https://hall.com/  This test is not yet approved or  cleared by the [place] FDA and has been authorized for detection and/or diagnosis of SARS-CoV-2 by FDA under an Emergency Use Authorization (EUA).  This EUA will remain in effect (meaning this test can be used) for the duration of the COVID-19 declaration under Section  564(b)(1) of the Act, 21 U.S.C. section 360bbb-3(b)(1), unless the authorization is terminated or revoked sooner.  Performed at [facility], [address] [place], [place] [address]      Labs: Basic Metabolic Panel: Recent Labs  Lab 01/27/22 0812 01/28/22 0310 01/29/22 0445  NA 139 139 139  K 4.3 4.9 3.8  CL 102 97* 94*  CO2 36* 37* 40*  GLUCOSE 88 309* 153*  BUN 11 19 18   CREATININE 0.83 1.11 1.05  CALCIUM 8.6* 8.5* 8.4*  MG 2.3 2.2  --    Liver Function Tests: Recent Labs  Lab 01/27/22 0812  AST 16  ALT 23  ALKPHOS 60  BILITOT 0.6  PROT 7.0  ALBUMIN 3.6   No results for input(s): LIPASE, AMYLASE in the last 168 hours. Recent Labs  Lab 01/28/22 0310  AMMONIA 39*   CBC: Recent Labs  Lab 01/27/22 0812  WBC 8.1  NEUTROABS 5.5  HGB 13.4  HCT 44.6  MCV 95.9  PLT 193   Cardiac Enzymes: No results for input(s): CKTOTAL, CKMB, CKMBINDEX, TROPONINI in the last 168 hours. BNP: BNP (last 3 results) Recent Labs    08/15/21 1905 08/16/21 0110 01/27/22 0812  BNP 136.7* 167.8* 50.9    ProBNP (last 3 results) No results for input(s): PROBNP in the last 8760 hours.  CBG: Recent Labs  Lab 01/28/22 0835 01/28/22 1711 01/28/22 2224 01/29/22 0740 01/29/22 1156  GLUCAP 257* 251* 181* 156* 209*       Signed:  Desma Maxim MD.  [facility] 01/29/2022, 1:14 PM

## 2022-02-02 NOTE — Progress Notes (Deleted)
   Patient ID: Cody Alexander, male    DOB: November 24, 1955, 66 y.o.   MRN: [id number]  HPI  Mr Folkerts is a 66 y/o male with a history of  Echo report from 08/16/21 reviewed and showed an EF of 60-65% along with moderate LVH  LHC done 02/27/18 and showed: Prox LAD lesion is 35% stenosed.  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 Noncardiac chest pain.  Admitted 01/27/22 due to shortness breath, chest pain, bilateral leg weakness and fall resulting in hitting his head. Neurology consult obtained. EEG negative. Given steroids due to COPD exacerbation. Neuro symptoms resolved. Discharged after 2 days.   He presents today for his initial visit with a chief complaint of   Review of Systems    Physical Exam  Assessment & Plan:  1: Chronic heart failure with preserved ejection fraction with structural change (LVH)- -  NYHA - saw [facility] cardiology 11/07/20 - BNP 01/27/22 was 50.9  2: HTN- - BP - BMP 01/29/22 reviewed and showed sodium 139, potassium 3.8, creatinine 1.05 and GFR >60  3: DM- - A1c 01/27/22 was 10.2%  4: COPD- -  5: OSA- - not wearing CPAP - to see pulmonology Volanda Napoleon) 11/07/20  6: Lymphedema- - saw vascular 12/07/21 with light wrapping of legs - possibly not a candidate for further compression due to HF

## 2022-02-04 ENCOUNTER — Ambulatory Visit: Payer: Medicare Other | Admitting: Family

## 2022-02-04 ENCOUNTER — Telehealth: Payer: Self-pay | Admitting: Family

## 2022-02-04 NOTE — Telephone Encounter (Signed)
Patient did not show for his [facility] appointment on 02/04/22. Will attempt to reschedule.   

## 2022-03-14 ENCOUNTER — Emergency Department: Payer: Medicare Other

## 2022-03-14 ENCOUNTER — Inpatient Hospital Stay: Payer: Medicare Other

## 2022-03-14 ENCOUNTER — Other Ambulatory Visit: Payer: Self-pay

## 2022-03-14 ENCOUNTER — Inpatient Hospital Stay
Admission: EM | Admit: 2022-03-14 | Discharge: 2022-03-21 | DRG: 189 | Disposition: A | Discharge status: Home / Self Care | Payer: Medicare Other | Attending: Internal Medicine | Admitting: Internal Medicine

## 2022-03-14 DIAGNOSIS — I5033 Acute on chronic diastolic (congestive) heart failure: Secondary | ICD-10-CM

## 2022-03-14 DIAGNOSIS — M5442 Lumbago with sciatica, left side: Secondary | ICD-10-CM | POA: Diagnosis not present

## 2022-03-14 DIAGNOSIS — Z881 Allergy status to other antibiotic agents status: Secondary | ICD-10-CM | POA: Diagnosis not present

## 2022-03-14 DIAGNOSIS — M5126 Other intervertebral disc displacement, lumbar region: Secondary | ICD-10-CM | POA: Diagnosis present

## 2022-03-14 DIAGNOSIS — I110 Hypertensive heart disease with heart failure: Secondary | ICD-10-CM | POA: Diagnosis present

## 2022-03-14 DIAGNOSIS — E785 Hyperlipidemia, unspecified: Secondary | ICD-10-CM | POA: Diagnosis present

## 2022-03-14 DIAGNOSIS — E1140 Type 2 diabetes mellitus with diabetic neuropathy, unspecified: Secondary | ICD-10-CM | POA: Diagnosis present

## 2022-03-14 DIAGNOSIS — M545 Low back pain, unspecified: Secondary | ICD-10-CM | POA: Diagnosis not present

## 2022-03-14 DIAGNOSIS — W19XXXA Unspecified fall, initial encounter: Secondary | ICD-10-CM | POA: Diagnosis not present

## 2022-03-14 DIAGNOSIS — R29898 Other symptoms and signs involving the musculoskeletal system: Secondary | ICD-10-CM | POA: Diagnosis not present

## 2022-03-14 DIAGNOSIS — G4733 Obstructive sleep apnea (adult) (pediatric): Secondary | ICD-10-CM | POA: Diagnosis present

## 2022-03-14 DIAGNOSIS — M48061 Spinal stenosis, lumbar region without neurogenic claudication: Secondary | ICD-10-CM | POA: Diagnosis present

## 2022-03-14 DIAGNOSIS — J9621 Acute and chronic respiratory failure with hypoxia: Secondary | ICD-10-CM | POA: Diagnosis present

## 2022-03-14 DIAGNOSIS — R21 Rash and other nonspecific skin eruption: Secondary | ICD-10-CM | POA: Diagnosis present

## 2022-03-14 DIAGNOSIS — G8929 Other chronic pain: Secondary | ICD-10-CM

## 2022-03-14 DIAGNOSIS — Z6841 Body Mass Index (BMI) 40.0 and over, adult: Secondary | ICD-10-CM

## 2022-03-14 DIAGNOSIS — J9602 Acute respiratory failure with hypercapnia: Principal | ICD-10-CM

## 2022-03-14 DIAGNOSIS — E8729 Other acidosis: Secondary | ICD-10-CM | POA: Diagnosis present

## 2022-03-14 DIAGNOSIS — M5136 Other intervertebral disc degeneration, lumbar region: Secondary | ICD-10-CM | POA: Diagnosis present

## 2022-03-14 DIAGNOSIS — I10 Essential (primary) hypertension: Secondary | ICD-10-CM | POA: Diagnosis present

## 2022-03-14 DIAGNOSIS — J441 Chronic obstructive pulmonary disease with (acute) exacerbation: Secondary | ICD-10-CM | POA: Diagnosis present

## 2022-03-14 DIAGNOSIS — Z9981 Dependence on supplemental oxygen: Secondary | ICD-10-CM

## 2022-03-14 DIAGNOSIS — S30810A Abrasion of lower back and pelvis, initial encounter: Secondary | ICD-10-CM | POA: Diagnosis present

## 2022-03-14 DIAGNOSIS — E119 Type 2 diabetes mellitus without complications: Secondary | ICD-10-CM

## 2022-03-14 DIAGNOSIS — R0602 Shortness of breath: Secondary | ICD-10-CM | POA: Diagnosis present

## 2022-03-14 DIAGNOSIS — Z8249 Family history of ischemic heart disease and other diseases of the circulatory system: Secondary | ICD-10-CM

## 2022-03-14 DIAGNOSIS — I5032 Chronic diastolic (congestive) heart failure: Secondary | ICD-10-CM

## 2022-03-14 DIAGNOSIS — Z7951 Long term (current) use of inhaled steroids: Secondary | ICD-10-CM

## 2022-03-14 DIAGNOSIS — J449 Chronic obstructive pulmonary disease, unspecified: Secondary | ICD-10-CM | POA: Diagnosis present

## 2022-03-14 DIAGNOSIS — E1165 Type 2 diabetes mellitus with hyperglycemia: Secondary | ICD-10-CM | POA: Diagnosis present

## 2022-03-14 DIAGNOSIS — Z823 Family history of stroke: Secondary | ICD-10-CM | POA: Diagnosis not present

## 2022-03-14 DIAGNOSIS — N3000 Acute cystitis without hematuria: Secondary | ICD-10-CM | POA: Diagnosis not present

## 2022-03-14 DIAGNOSIS — J9622 Acute and chronic respiratory failure with hypercapnia: Secondary | ICD-10-CM | POA: Diagnosis present

## 2022-03-14 DIAGNOSIS — R296 Repeated falls: Secondary | ICD-10-CM | POA: Diagnosis present

## 2022-03-14 DIAGNOSIS — W109XXA Fall (on) (from) unspecified stairs and steps, initial encounter: Secondary | ICD-10-CM | POA: Diagnosis present

## 2022-03-14 DIAGNOSIS — Y92019 Unspecified place in single-family (private) house as the place of occurrence of the external cause: Secondary | ICD-10-CM | POA: Diagnosis not present

## 2022-03-14 DIAGNOSIS — B9620 Unspecified Escherichia coli [E. coli] as the cause of diseases classified elsewhere: Secondary | ICD-10-CM | POA: Diagnosis present

## 2022-03-14 DIAGNOSIS — N390 Urinary tract infection, site not specified: Secondary | ICD-10-CM | POA: Diagnosis present

## 2022-03-14 DIAGNOSIS — Z7984 Long term (current) use of oral hypoglycemic drugs: Secondary | ICD-10-CM

## 2022-03-14 DIAGNOSIS — Z794 Long term (current) use of insulin: Secondary | ICD-10-CM

## 2022-03-14 DIAGNOSIS — Z79899 Other long term (current) drug therapy: Secondary | ICD-10-CM

## 2022-03-14 DIAGNOSIS — Z7952 Long term (current) use of systemic steroids: Secondary | ICD-10-CM

## 2022-03-14 DIAGNOSIS — J9620 Acute and chronic respiratory failure, unspecified whether with hypoxia or hypercapnia: Secondary | ICD-10-CM

## 2022-03-14 DIAGNOSIS — Y92009 Unspecified place in unspecified non-institutional (private) residence as the place of occurrence of the external cause: Secondary | ICD-10-CM | POA: Diagnosis not present

## 2022-03-14 DIAGNOSIS — M5441 Lumbago with sciatica, right side: Secondary | ICD-10-CM | POA: Diagnosis not present

## 2022-03-14 DIAGNOSIS — E669 Obesity, unspecified: Secondary | ICD-10-CM | POA: Diagnosis present

## 2022-03-14 LAB — COMPREHENSIVE METABOLIC PANEL
ALT: 18 U/L (ref 0–44)
AST: 17 U/L (ref 15–41)
Albumin: 3.9 g/dL (ref 3.5–5.0)
Alkaline Phosphatase: 55 U/L (ref 38–126)
Anion gap: 6 (ref 5–15)
BUN: 28 mg/dL — ABNORMAL HIGH (ref 8–23)
CO2: 41 mmol/L — ABNORMAL HIGH (ref 22–32)
Calcium: 9.1 mg/dL (ref 8.9–10.3)
Chloride: 93 mmol/L — ABNORMAL LOW (ref 98–111)
Creatinine, Ser: 1.27 mg/dL — ABNORMAL HIGH (ref 0.61–1.24)
GFR, Estimated: >60 mL/min (ref >60)
Glucose, Bld: 171 mg/dL — ABNORMAL HIGH (ref 70–99)
Potassium: 3.8 mmol/L (ref 3.5–5.1)
Sodium: 140 mmol/L (ref 135–145)
Total Bilirubin: 0.9 mg/dL (ref 0.3–1.2)
Total Protein: 7.2 g/dL (ref 6.5–8.1)

## 2022-03-14 LAB — TROPONIN I (HIGH SENSITIVITY)
Troponin I (High Sensitivity): 91 ng/L — ABNORMAL HIGH (ref <18)
Troponin I (High Sensitivity): 78 ng/L — ABNORMAL HIGH (ref <18)

## 2022-03-14 LAB — BLOOD GAS, VENOUS
Acid-Base Excess: 17.9 mmol/L — ABNORMAL HIGH (ref 0.0–2.0)
Acid-Base Excess: 13.4 mmol/L — ABNORMAL HIGH (ref 0.0–2.0)
Bicarbonate: 49.3 mmol/L — ABNORMAL HIGH (ref 20.0–28.0)
Bicarbonate: 44.6 mmol/L — ABNORMAL HIGH (ref 20.0–28.0)
O2 Saturation: 39.2 %
O2 Saturation: 24.5 %
Patient temperature: 37
Patient temperature: 37
pCO2, Ven: 98 mmHg (ref 44–60)
pCO2, Ven: 95 mmHg (ref 44–60)
pH, Ven: 7.31 (ref 7.25–7.43)
pH, Ven: 7.28 (ref 7.25–7.43)
pO2, Ven: <31 mmHg — CL (ref 32–45)

## 2022-03-14 LAB — URINALYSIS, ROUTINE W REFLEX MICROSCOPIC
Bilirubin Urine: NEGATIVE
Glucose, UA: NEGATIVE mg/dL
Hgb urine dipstick: NEGATIVE
Ketones, ur: NEGATIVE mg/dL
Nitrite: NEGATIVE
Protein, ur: NEGATIVE mg/dL
Specific Gravity, Urine: 1.01 (ref 1.005–1.030)
Squamous Epithelial / HPF: NONE SEEN (ref 0–5)
pH: 6 (ref 5.0–8.0)

## 2022-03-14 LAB — CBC
HCT: 43.8 % (ref 39.0–52.0)
Hemoglobin: 13.3 g/dL (ref 13.0–17.0)
MCH: 28.5 pg (ref 26.0–34.0)
MCHC: 30.4 g/dL (ref 30.0–36.0)
MCV: 94 fL (ref 80.0–100.0)
Platelets: 238 10*3/uL (ref 150–400)
RBC: 4.66 MIL/uL (ref 4.22–5.81)
RDW: 13.3 % (ref 11.5–15.5)
WBC: 7.5 10*3/uL (ref 4.0–10.5)
nRBC: 0 % (ref 0.0–0.2)

## 2022-03-14 LAB — GLUCOSE, CAPILLARY
Glucose-Capillary: 330 mg/dL — ABNORMAL HIGH (ref 70–99)
Glucose-Capillary: 369 mg/dL — ABNORMAL HIGH (ref 70–99)

## 2022-03-14 LAB — BRAIN NATRIURETIC PEPTIDE: B Natriuretic Peptide: 39.1 pg/mL (ref 0.0–100.0)

## 2022-03-14 LAB — CBG MONITORING, ED
Glucose-Capillary: 154 mg/dL — ABNORMAL HIGH (ref 70–99)
Glucose-Capillary: 186 mg/dL — ABNORMAL HIGH (ref 70–99)

## 2022-03-14 MED ORDER — METOPROLOL TARTRATE 25 MG PO TABS
25.0000 mg | ORAL_TABLET | Freq: Two times a day (BID) | ORAL | Status: DC
  Administered 2022-03-14 – 2022-03-21 (×15): 25 mg via ORAL
  Filled 2022-03-14 (×15): qty 1

## 2022-03-14 MED ORDER — IPRATROPIUM-ALBUTEROL 0.5-2.5 (3) MG/3ML IN SOLN
3.0000 mL | Freq: Once | RESPIRATORY_TRACT | Status: AC
  Administered 2022-03-14: 3 mL via RESPIRATORY_TRACT
  Filled 2022-03-14: qty 3

## 2022-03-14 MED ORDER — FUROSEMIDE 10 MG/ML IJ SOLN
60.0000 mg | Freq: Once | INTRAMUSCULAR | Status: AC
  Administered 2022-03-14: 60 mg via INTRAVENOUS
  Filled 2022-03-14: qty 8

## 2022-03-14 MED ORDER — LISINOPRIL 20 MG PO TABS
20.0000 mg | ORAL_TABLET | Freq: Every day | ORAL | Status: DC
  Administered 2022-03-14 – 2022-03-21 (×8): 20 mg via ORAL
  Filled 2022-03-14 (×5): qty 1
  Filled 2022-03-14: qty 2
  Filled 2022-03-14 (×2): qty 1

## 2022-03-14 MED ORDER — ACETAMINOPHEN 500 MG PO TABS
1000.0000 mg | ORAL_TABLET | Freq: Four times a day (QID) | ORAL | Status: DC | PRN
  Administered 2022-03-15 – 2022-03-17 (×4): 1000 mg via ORAL
  Filled 2022-03-14 (×4): qty 2

## 2022-03-14 MED ORDER — INSULIN LISPRO (1 UNIT DIAL) 100 UNIT/ML (KWIKPEN): 16.0000 [IU] | PEN_INJECTOR | Freq: Three times a day (TID) | SUBCUTANEOUS | Status: DC

## 2022-03-14 MED ORDER — INSULIN DETEMIR 100 UNIT/ML ~~LOC~~ SOLN
50.0000 [IU] | Freq: Every day | SUBCUTANEOUS | Status: DC
  Administered 2022-03-15 – 2022-03-20 (×6): 50 [IU] via SUBCUTANEOUS
  Filled 2022-03-14 (×7): qty 0.5

## 2022-03-14 MED ORDER — ONDANSETRON HCL 4 MG/2ML IJ SOLN
4.0000 mg | Freq: Once | INTRAMUSCULAR | Status: AC
  Administered 2022-03-14: 4 mg via INTRAVENOUS
  Filled 2022-03-14: qty 2

## 2022-03-14 MED ORDER — INSULIN ASPART 100 UNIT/ML IJ SOLN
0.0000 [IU] | Freq: Three times a day (TID) | INTRAMUSCULAR | Status: DC
  Administered 2022-03-14: 11 [IU] via SUBCUTANEOUS
  Administered 2022-03-14: 3 [IU] via SUBCUTANEOUS
  Administered 2022-03-15 (×2): 8 [IU] via SUBCUTANEOUS
  Administered 2022-03-15: 11 [IU] via SUBCUTANEOUS
  Administered 2022-03-16: 3 [IU] via SUBCUTANEOUS
  Administered 2022-03-16: 5 [IU] via SUBCUTANEOUS
  Administered 2022-03-17: 2 [IU] via SUBCUTANEOUS
  Administered 2022-03-17 – 2022-03-18 (×2): 3 [IU] via SUBCUTANEOUS
  Administered 2022-03-18 – 2022-03-19 (×3): 5 [IU] via SUBCUTANEOUS
  Administered 2022-03-19 – 2022-03-20 (×2): 3 [IU] via SUBCUTANEOUS
  Administered 2022-03-20: 2 [IU] via SUBCUTANEOUS
  Administered 2022-03-20: 8 [IU] via SUBCUTANEOUS
  Administered 2022-03-21 (×2): 3 [IU] via SUBCUTANEOUS
  Filled 2022-03-14 (×20): qty 1

## 2022-03-14 MED ORDER — ENOXAPARIN SODIUM 80 MG/0.8ML IJ SOSY
0.5000 mg/kg | PREFILLED_SYRINGE | INTRAMUSCULAR | Status: DC
Start: 2022-03-14 — End: 2022-03-21
  Administered 2022-03-14 – 2022-03-20 (×7): 65 mg via SUBCUTANEOUS
  Filled 2022-03-14 (×7): qty 0.8

## 2022-03-14 MED ORDER — METFORMIN HCL 500 MG PO TABS: 1000.0000 mg | ORAL_TABLET | Freq: Two times a day (BID) | ORAL | Status: DC

## 2022-03-14 MED ORDER — ISOSORBIDE MONONITRATE ER 30 MG PO TB24
30.0000 mg | ORAL_TABLET | Freq: Every day | ORAL | Status: DC
  Administered 2022-03-14 – 2022-03-21 (×8): 30 mg via ORAL
  Filled 2022-03-14 (×8): qty 1

## 2022-03-14 MED ORDER — INSULIN DETEMIR 100 UNIT/ML FLEXPEN
50.0000 [IU] | PEN_INJECTOR | Freq: Two times a day (BID) | SUBCUTANEOUS | Status: DC
Start: 2022-03-14 — End: 2022-03-14

## 2022-03-14 MED ORDER — MOMETASONE FURO-FORMOTEROL FUM 200-5 MCG/ACT IN AERO
2.0000 | INHALATION_SPRAY | Freq: Two times a day (BID) | RESPIRATORY_TRACT | Status: DC
  Administered 2022-03-15 – 2022-03-21 (×12): 2 via RESPIRATORY_TRACT
  Filled 2022-03-14: qty 8.8

## 2022-03-14 MED ORDER — PREDNISONE 20 MG PO TABS
40.0000 mg | ORAL_TABLET | Freq: Every day | ORAL | Status: DC
  Administered 2022-03-15: 40 mg via ORAL
  Filled 2022-03-14: qty 2

## 2022-03-14 MED ORDER — MORPHINE SULFATE (PF) 4 MG/ML IV SOLN
4.0000 mg | INTRAVENOUS | Status: DC | PRN
  Administered 2022-03-14 (×3): 4 mg via INTRAVENOUS
  Filled 2022-03-14 (×3): qty 1

## 2022-03-14 MED ORDER — ISOSORBIDE DINITRATE 30 MG PO TABS: 30.0000 mg | ORAL_TABLET | Freq: Every morning | ORAL | Status: DC

## 2022-03-14 MED ORDER — METHYLPREDNISOLONE SODIUM SUCC 125 MG IJ SOLR
125.0000 mg | Freq: Once | INTRAMUSCULAR | Status: AC
  Administered 2022-03-14: 125 mg via INTRAVENOUS
  Filled 2022-03-14: qty 2

## 2022-03-14 MED ORDER — ATORVASTATIN CALCIUM 20 MG PO TABS
40.0000 mg | ORAL_TABLET | Freq: Every day | ORAL | Status: DC
  Administered 2022-03-14 – 2022-03-21 (×8): 40 mg via ORAL
  Filled 2022-03-14 (×8): qty 2

## 2022-03-14 MED ORDER — SODIUM CHLORIDE 0.9 % IV SOLN
1.0000 g | INTRAVENOUS | Status: AC
  Administered 2022-03-14 – 2022-03-16 (×3): 1 g via INTRAVENOUS
  Filled 2022-03-14 (×3): qty 10

## 2022-03-14 MED ORDER — SODIUM CHLORIDE 0.9 % IV SOLN
250.0000 mL | INTRAVENOUS | Status: DC | PRN
  Administered 2022-03-15: 250 mL via INTRAVENOUS

## 2022-03-14 MED ORDER — MORPHINE SULFATE (PF) 2 MG/ML IV SOLN: 2.0000 mg | INTRAVENOUS | Status: DC | PRN

## 2022-03-14 MED ORDER — IPRATROPIUM-ALBUTEROL 0.5-2.5 (3) MG/3ML IN SOLN: 3.0000 mL | Freq: Four times a day (QID) | RESPIRATORY_TRACT | Status: DC | PRN

## 2022-03-14 MED ORDER — SODIUM CHLORIDE 0.9% FLUSH
3.0000 mL | INTRAVENOUS | Status: DC | PRN
  Administered 2022-03-19: 3 mL via INTRAVENOUS

## 2022-03-14 MED ORDER — SPIRONOLACTONE 25 MG PO TABS
12.5000 mg | ORAL_TABLET | Freq: Every day | ORAL | Status: DC
  Administered 2022-03-14 – 2022-03-21 (×8): 12.5 mg via ORAL
  Filled 2022-03-14 (×3): qty 1
  Filled 2022-03-14: qty 0.5
  Filled 2022-03-14: qty 1
  Filled 2022-03-14 (×3): qty 0.5
  Filled 2022-03-14: qty 1
  Filled 2022-03-14: qty 0.5
  Filled 2022-03-14: qty 1
  Filled 2022-03-14 (×3): qty 0.5
  Filled 2022-03-14 (×2): qty 1

## 2022-03-14 MED ORDER — AMLODIPINE BESYLATE 10 MG PO TABS
10.0000 mg | ORAL_TABLET | Freq: Every day | ORAL | Status: DC
  Administered 2022-03-14 – 2022-03-21 (×8): 10 mg via ORAL
  Filled 2022-03-14: qty 2
  Filled 2022-03-14 (×7): qty 1

## 2022-03-14 MED ORDER — OXYCODONE-ACETAMINOPHEN 5-325 MG PO TABS
1.0000 | ORAL_TABLET | ORAL | Status: DC | PRN
  Administered 2022-03-14 – 2022-03-17 (×10): 1 via ORAL
  Filled 2022-03-14 (×10): qty 1

## 2022-03-14 MED ORDER — ISOSORBIDE DINITRATE 30 MG PO TABS: 30.0000 mg | ORAL_TABLET | Freq: Two times a day (BID) | ORAL | Status: DC

## 2022-03-14 MED ORDER — ONDANSETRON HCL 4 MG PO TABS: 4.0000 mg | ORAL_TABLET | Freq: Four times a day (QID) | ORAL | Status: DC | PRN

## 2022-03-14 MED ORDER — ONDANSETRON HCL 4 MG/2ML IJ SOLN: 4.0000 mg | Freq: Four times a day (QID) | INTRAMUSCULAR | Status: DC | PRN

## 2022-03-14 MED ORDER — FUROSEMIDE 40 MG PO TABS
80.0000 mg | ORAL_TABLET | Freq: Two times a day (BID) | ORAL | Status: DC
  Administered 2022-03-14 – 2022-03-21 (×15): 80 mg via ORAL
  Filled 2022-03-14 (×15): qty 2

## 2022-03-14 MED ORDER — SODIUM CHLORIDE 0.9% FLUSH
3.0000 mL | Freq: Two times a day (BID) | INTRAVENOUS | Status: DC
  Administered 2022-03-14 – 2022-03-21 (×14): 3 mL via INTRAVENOUS

## 2022-03-14 NOTE — Assessment & Plan Note (Signed)
Patient presents to the ER for evaluation of multiple falls at home over the last several days due to bilateral lower extremity weakness. Place patient on fall precautions Follow-up results of MRI of lumbar spine PT evaluation

## 2022-03-14 NOTE — Assessment & Plan Note (Signed)
Stable and not acutely exacerbated Last known LVEF of 60 to 65% from a 2D echocardiogram which was done 12 /21 Continue furosemide, lisinopril, spironolactone and metoprolol

## 2022-03-14 NOTE — ED Triage Notes (Addendum)
Pt presents to ER via ems from home c/o lower back pain x3 days.  Pt states pain shoots down BIL legs but is worse on right side.  Pt also endorses some dysuria that pt states has been ongoing, but has become worse since back pain started.  Pt endorses increased swelling to BIL lower extremities.  States he has hx of CHF and takes lasix at home.  Pt is A&O x4 at this time.  Pt appears uncomfortable in triage.  Also states he had fall landing on his buttocks.    Pt also endorses some central chest pain that is non-radiating in nature.

## 2022-03-14 NOTE — ED Notes (Signed)
Pt brought to ED rm 16 at this time, this RN now assuming care. 

## 2022-03-14 NOTE — ED Notes (Signed)
Pt taken to radiology at this time.

## 2022-03-14 NOTE — Assessment & Plan Note (Signed)
Patient has a history of COPD with chronic respiratory failure and is usually on 4 L of oxygen. He required BiPAP in the ER due to uncompensated respiratory acidosis and to reduce work of breathing. We will attempt to wean patient off BiPAP as tolerated and place back on his baseline home oxygen at 4 L.

## 2022-03-14 NOTE — ED Provider Notes (Signed)
[facility] Provider Note    Event Date/Time   First MD Initiated Contact with Patient 03/14/22 [phone]     (approximate)   History   Back Pain   HPI  Cody Alexander is a 66 y.o. male with extensive past medical history including degenerative disc disease CHF COPD on home oxygen presents to the ER for evaluation of worsening shortness of breath as well as low back pain.  Symptoms low back pain is his primary concerns today and that he had a fall few days ago and since then has been having shooting pain down his right leg.  States that he will fall because he is having pain in that leg.  Denies any fevers.  Denies any pain with deep inspiration.  States he has been taking his home Lasix but he feels like the swelling in his legs is increasing.     Physical Exam   Triage Vital Signs: ED Triage Vitals  Enc Vitals Group     BP 03/14/22 0540 (!) 147/98     Pulse Rate 03/14/22 0540 72     Resp 03/14/22 0540 20     Temp 03/14/22 0540 97.8 F (36.6 C)     Temp Source 03/14/22 0540 Oral     SpO2 03/14/22 0540 100 %     Weight 03/14/22 0541 290 lb (131.5 kg)     Height 03/14/22 0541 6\' 1"  (1.854 m)     Head Circumference --      Peak Flow --      Pain Score 03/14/22 0541 10     Pain Loc --      Pain Edu? --      Excl. in GC? --     Most recent vital signs: Vitals:   03/14/22 0631 03/14/22 0757  BP: (!) 147/96 (!) 153/96  Pulse: 70 92  Resp: 20 20  Temp:    SpO2: 96% 98%     Constitutional: Alert  Eyes: Conjunctivae are normal.  Head: Atraumatic. Nose: No congestion/rhinnorhea. Mouth/Throat: Mucous membranes are moist.   Neck: Painless ROM.  Cardiovascular:   Good peripheral circulation. Respiratory: Mild tachypnea with diminished breath sounds faint expiratory wheeze posteriorly Gastrointestinal: Soft and nontender.  Musculoskeletal:  no deformity, ble pitting edema Neurologic:  MAE spontaneously. No gross focal neurologic deficits are  appreciated.  Skin:  Skin is warm, dry and intact. No rash noted. Psychiatric: Mood and affect are normal. Speech and behavior are normal.    ED Results / Procedures / Treatments   Labs (all labs ordered are listed, but only abnormal results are displayed) Labs Reviewed  COMPREHENSIVE METABOLIC PANEL - Abnormal; Notable for the following components:      Result Value   Chloride 93 (*)    CO2 41 (*)    Glucose, Bld 171 (*)    BUN 28 (*)    Creatinine, Ser 1.27 (*)    All other components within normal limits  URINALYSIS, ROUTINE W REFLEX MICROSCOPIC - Abnormal; Notable for the following components:   Color, Urine YELLOW (*)    APPearance CLEAR (*)    Leukocytes,Ua MODERATE (*)    Bacteria, UA RARE (*)    All other components within normal limits  BLOOD GAS, VENOUS - Abnormal; Notable for the following components:   pCO2, Ven 98 (*)    Bicarbonate 49.3 (*)    Acid-Base Excess 17.9 (*)    All other components within normal limits  TROPONIN I (HIGH SENSITIVITY) -  Abnormal; Notable for the following components:   Troponin I (High Sensitivity) 91 (*)    All other components within normal limits  TROPONIN I (HIGH SENSITIVITY) - Abnormal; Notable for the following components:   Troponin I (High Sensitivity) 78 (*)    All other components within normal limits  CBC  BRAIN NATRIURETIC PEPTIDE     EKG ED ECG REPORT I, Willy Eddy, the attending physician, personally viewed and interpreted this ECG.   Date: 03/14/2022  EKG Time: 5:51  Rate: 74  Rhythm: sinus  Axis: normal  Intervals:normal qt  ST&T Change: no stemi, no depression     RADIOLOGY Please see ED Course for my review and interpretation.  I personally reviewed all radiographic images ordered to evaluate for the above acute complaints and reviewed radiology reports and findings.  These findings were personally discussed with the patient.  Please see medical record for radiology  report.    PROCEDURES:  Critical Care performed: Yes, see critical care procedure note(s)  .Critical Care  Performed by: Willy Eddy, MD Authorized by: Willy Eddy, MD   Critical care provider statement:    Critical care time (minutes):  35   Critical care was necessary to treat or prevent imminent or life-threatening deterioration of the following conditions:  Respiratory failure   Critical care was time spent personally by me on the following activities:  Ordering and performing treatments and interventions, ordering and review of laboratory studies, ordering and review of radiographic studies, pulse oximetry, re-evaluation of patient's condition, review of old charts, obtaining history from patient or surrogate, examination of patient, evaluation of patient's response to treatment, discussions with primary provider, discussions with consultants and development of treatment plan with patient or surrogate    MEDICATIONS ORDERED IN ED: Medications  morphine (PF) 4 MG/ML injection 4 mg (4 mg Intravenous Given 03/14/22 0754)  cefTRIAXone (ROCEPHIN) 1 g in sodium chloride 0.9 % 100 mL IVPB (has no administration in time range)  furosemide (LASIX) injection 60 mg (60 mg Intravenous Given 03/14/22 0756)  ondansetron (ZOFRAN) injection 4 mg (4 mg Intravenous Given 03/14/22 0754)  ipratropium-albuterol (DUONEB) 0.5-2.5 (3) MG/3ML nebulizer solution 3 mL (3 mLs Nebulization Given 03/14/22 0856)  ipratropium-albuterol (DUONEB) 0.5-2.5 (3) MG/3ML nebulizer solution 3 mL (3 mLs Nebulization Given 03/14/22 0856)  methylPREDNISolone sodium succinate (SOLU-MEDROL) 125 mg/2 mL injection 125 mg (125 mg Intravenous Given 03/14/22 0855)     IMPRESSION / MDM / ASSESSMENT AND PLAN / ED COURSE  I reviewed the triage vital signs and the nursing notes.                              Differential diagnosis includes, but is not limited to, fracture, contusion, lumbago, sciatica, cauda equina, spinal  stenosis, radiculopathy, COPD, CHF, pneumonia  Patient presented to the ER for evaluation of symptoms as described above.  This presenting complaint could reflect a potentially life-threatening illness therefore the patient will be placed on continuous pulse oximetry and telemetry for monitoring.  Laboratory evaluation will be sent to evaluate for the above complaints.   Patient is somewhat drowsy on my evaluation does have findings concerning for COPD exacerbation.  No focal neurodeficits.  Given his comorbidities will order blood work including VBG.  X-ray ordered at triage does not show evidence of fracture will order MRI.   Clinical Course as of 03/14/22 0922  Thu Mar 14, 2022  0829 Patient significantly hypercapnic.  Will place on  BiPAP.  We will give nebulizer treatments as well as Solu-Medrol.  Troponins downtrending.  Hypercapnia might be causing his increasing falls.  Possibly related to simply worsening of underlying COPD or polypharmacy.  Given his presentation will consult hospitalist for admission. [PR]  0919 Chest x-ray on my review and interpretation does not show any evidence of pneumothorax or consolidation. [PR]    Clinical Course User Index [PR] Willy Eddy, MD     FINAL CLINICAL IMPRESSION(S) / ED DIAGNOSES   Final diagnoses:  Acute respiratory failure with hypercapnia (HCC)  Acute low back pain, unspecified back pain laterality, unspecified whether sciatica present     Rx / DC Orders   ED Discharge Orders     None        Note:  This document was prepared using Dragon voice recognition software and may include unintentional dictation errors.    Willy Eddy, MD 03/14/22 [phone]

## 2022-03-14 NOTE — Assessment & Plan Note (Signed)
Patient noted to have pyuria We will treat empirically with Rocephin until urine culture results become available

## 2022-03-14 NOTE — ED Notes (Signed)
Pt to MRi 

## 2022-03-14 NOTE — Assessment & Plan Note (Signed)
Patient has insulin-dependent diabetes mellitus Continue long-acting insulin and Premeal insulin Check blood sugars with meals

## 2022-03-14 NOTE — Assessment & Plan Note (Signed)
Stable and not acutely exacerbated Continue as needed bronchodilator therapy Continue inhaled and systemic steroids

## 2022-03-14 NOTE — Assessment & Plan Note (Signed)
BMI 38.26 kg/m2 Complicates overall prognosis and care

## 2022-03-14 NOTE — H&P (Signed)
History and Physical    Patient: Cody Alexander LSL:[id number] DOB: Jan 18, 1956 DOA: 03/14/2022 DOS: the patient was seen and examined on 03/14/2022 PCP: Rutherford Limerick, PA  Patient coming from: Home  Chief Complaint:  Chief Complaint  Patient presents with   Back Pain   HPI: Cody Alexander is a 66 y.o. 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 Patient states that he fell walking down the stairs landing on his right buttock.  Since then he has had worsening pain in his lower back which he rates an 8 x 10 in intensity at its worst associated with bilateral lower extremity swelling and inability to walk.  He has had multiple falls over the last 5 days.  He denies having any urinary or fecal incontinence and denies having any saddle anesthesia.  He denies having any frequency of urination, no dysuria or nocturia.  He complains of bilateral lower extremity swelling and has a cough that is nonproductive.  He also complains of midsternal chest pain and has shortness of breath unchanged from his baseline.  Chest pain is nonradiating and he denies having any associated nausea, no vomiting, no palpitations.  He denies having any fever or chills. He had an arterial blood gas done in the ER which showed uncompensated respiratory acidosis and was placed on a BiPAP.     Review of Systems: As mentioned in the history of present illness. All other systems reviewed and are negative. Past Medical History:  Diagnosis Date   CHF (congestive heart failure) (HCC)    COPD (chronic obstructive pulmonary disease) (HCC)    Diabetes mellitus without complication ([place])    Hypertension    Neuropathy    Past Surgical History:   Procedure Laterality Date   LEFT HEART CATH AND CORONARY ANGIOGRAPHY Right 02/27/2018   Procedure: LEFT HEART CATH AND CORONARY ANGIOGRAPHY;  Surgeon: Dionisio David, MD;  Location: [place] [facility];  Service: Cardiovascular;  Laterality: Right;   Social History:  reports that he has never smoked. He has never used smokeless tobacco. He reports that he does not drink alcohol and does not use drugs.  Allergies  Allergen Reactions   Erythromycin Anaphylaxis and Swelling    Had eye swelling with erythromycin during a time when he had perf ear drum.  Tolerated azithromycin.    Family History  Problem Relation Age of Onset   Stroke Mother    Hypertension Father     Prior to Admission medications   Medication Sig Start Date End Date Taking? Authorizing Provider  acetaminophen (TYLENOL) 500 MG tablet Take 1,000 mg by mouth every 6 (six) hours as needed for mild pain or moderate pain.    [provider]  amLODipine (NORVASC) 10 MG tablet Take 10 mg by mouth daily.    [provider]  atorvastatin (LIPITOR) 40 MG tablet Take 40 mg by mouth daily.    [provider]  budesonide-formoterol (SYMBICORT) 160-4.5 MCG/ACT inhaler Inhale 2 puffs into the lungs 2 (two) times daily.    [provider]  furosemide (LASIX) 80 MG tablet Take 80 mg by mouth 2 (two) times daily.    [provider]  insulin detemir (LEVEMIR) 100 UNIT/ML FlexPen Inject 50 Units into  the skin in the morning and at bedtime.    [provider]  insulin lispro (HUMALOG) 100 UNIT/ML KwikPen Inject 16 Units into the skin 3 (three) times daily with meals.    [provider]  isosorbide dinitrate (ISORDIL) 30 MG tablet Take 30 mg by mouth every morning. 11/14/20   [provider]  lisinopril (ZESTRIL) 20 MG tablet Take 20 mg by mouth daily.    [provider]  metFORMIN (GLUCOPHAGE) 1000 MG tablet Take 1,000 mg by mouth 2 (two) times daily. 06/02/20    [provider]  metoprolol tartrate (LOPRESSOR) 25 MG tablet Take 25 mg by mouth 2 (two) times daily.    [provider]  predniSONE (DELTASONE) 20 MG tablet Take 2 tablets (40 mg total) by mouth daily with breakfast. 01/30/22   Wouk, Ailene Rud, MD  PROAIR HFA [phone] Base) MCG/ACT inhaler Inhale 2 puffs into the lungs every 4 (four) hours as needed for wheezing. 08/18/21   Loletha Grayer, MD  spironolactone (ALDACTONE) 25 MG tablet Take 12.5 mg by mouth daily.    [provider]    Physical Exam: Vitals:   03/14/22 1030 03/14/22 1048 03/14/22 1130 03/14/22 1236  BP: (!) 135/96  (!) 133/93 115/81  Pulse: 67  66 86  Resp:    16  Temp:  98.3 F (36.8 C)    TempSrc:  Axillary    SpO2: 96%  91% 90%  Weight:      Height:       Physical Exam Vitals and nursing note reviewed.  Constitutional:      Appearance: He is obese.  HENT:     Head: Normocephalic and atraumatic.     Nose: Nose normal.     Mouth/Throat:     Mouth: Mucous membranes are moist.  Eyes:     Pupils: Pupils are equal, round, and reactive to light.  Cardiovascular:     Rate and Rhythm: Normal rate and regular rhythm.  Pulmonary:     Effort: Pulmonary effort is normal.     Breath sounds: Normal breath sounds.  Abdominal:     General: Bowel sounds are normal.     Palpations: Abdomen is soft.     Comments: Central adiposity  Musculoskeletal:     Cervical back: Normal range of motion and neck supple.     Right lower leg: Edema present.     Left lower leg: Edema present.  Skin:    General: Skin is warm and dry.  Neurological:     General: No focal deficit present.     Mental Status: He is alert and oriented to person, place, and time.  Psychiatric:        Mood and Affect: Mood normal.        Behavior: Behavior normal.     Data Reviewed: Relevant notes from primary care and specialist visits, past discharge summaries as available in EHR, including Care Everywhere. Prior  diagnostic testing as pertinent to current admission diagnoses Updated medications and problem lists for reconciliation ED course, including vitals, labs, imaging, treatment and response to treatment Triage notes, nursing and pharmacy notes and ED provider's notes Notable results as noted in HPI VBG 7.31/98/49.3/17.9 UA shows moderate leukocyte esterase Troponin 91 >> 78, BNP 39.1, sodium 140, potassium 3.8, chloride 93, bicarb 41, glucose 171, BUN 28, creatinine 1.27, calcium 9.1, total protein 7.2, albumin 3.9, AST 17, ALT 18, alk phos 55, total bilirubin 0.9, white count 7.5, hemoglobin 13.3, hematocrit 43.8,  MCV 94, RDW 13.3, platelet count 230 Chest x-ray reviewed by me shows Foci of linear atelectasis within the left mid lung. Otherwise, no evidence of acute cardiopulmonary abnormality. Aortic Atherosclerosis  Lower extremity venous Doppler is negative CT scan of the lumbar spine shows No recent fracture is seen in the lumbar spine. Moderate to marked degenerative changes are noted with disc space narrowing, bony spurs and facet hypertrophy at multiple levels. Twelve-lead EKG reviewed by me shows sinus rhythm with first-degree AV block. There are no new results to review at this time.  Assessment and Plan: * Acute on chronic respiratory failure ([place]) Patient has a history of COPD with chronic respiratory failure and is usually on 4 L of oxygen. He required BiPAP in the ER due to uncompensated respiratory acidosis and to reduce work of breathing. We will attempt to wean patient off BiPAP as tolerated and place back on his baseline home oxygen at 4 L.   COPD (chronic obstructive pulmonary disease) (HCC) Stable and not acutely exacerbated Continue as needed bronchodilator therapy Continue inhaled and systemic steroids  Chronic diastolic CHF (congestive heart failure) (HCC) Stable and not acutely exacerbated Last known LVEF of 60 to 65% from a 2D echocardiogram which was done 12  /21 Continue furosemide, lisinopril, spironolactone and metoprolol  Acute exacerbation of chronic low back pain Patient presents to the ER for evaluation of bilateral lower extremity weakness and worsening low back pain and has had several falls at home secondary to same. We will place patient on fall precautions Follow-up results of MRI of lumbar spine PT evaluation Pain control  Leg weakness, bilateral Patient presents to the ER for evaluation of bilateral lower extremity weakness and acute on chronic low back pain. He has had multiple falls at home related to same. Place patient on fall precautions Follow-up results of MRI of lumbar spine Will need PT evaluation  Essential hypertension Blood pressure is stable Continue amlodipine, lisinopril and metoprolol   Fall at home, initial encounter Patient presents to the ER for evaluation of multiple falls at home over the last several days due to bilateral lower extremity weakness. Place patient on fall precautions Follow-up results of MRI of lumbar spine PT evaluation  Obesity (BMI 30-39.9) BMI 21.19 kg/m2 Complicates overall prognosis and care  OSA (obstructive sleep apnea) Patient with a history of obstructive sleep apnea secondary to morbid obesity CPAP at bedtime  UTI (urinary tract infection) Patient noted to have pyuria We will treat empirically with Rocephin until urine culture results become available  Diabetes mellitus without complication ([place]) Patient has insulin-dependent diabetes mellitus Continue long-acting insulin and Premeal insulin Check blood sugars with meals      Advance Care Planning:   Code Status: Full Code   Consults: None  Family Communication: Greater than 50% of time was spent discussing patient's condition and plan of care with him at the bedside.  All questions and concerns have been addressed.  He verbalizes understanding and agrees with the plan.  Severity of Illness: The appropriate  patient status for this patient is INPATIENT. Inpatient status is judged to be reasonable and necessary in order to provide the required intensity of service to ensure the patient's safety. The patient's presenting symptoms, physical exam findings, and initial radiographic and laboratory data in the context of their chronic comorbidities is felt to place them at high risk for further clinical deterioration. Furthermore, it is not anticipated that the patient will be medically stable for discharge from the hospital within 2  midnights of admission.   * I certify that at the point of admission it is my clinical judgment that the patient will require inpatient hospital care spanning beyond 2 midnights from the point of admission due to high intensity of service, high risk for further deterioration and high frequency of surveillance required.*  Author: Collier Bullock, MD 03/14/2022 12:57 PM  For on call review [email or web address].

## 2022-03-14 NOTE — Assessment & Plan Note (Signed)
Patient presents to the ER for evaluation of bilateral lower extremity weakness and acute on chronic low back pain. He has had multiple falls at home related to same. Place patient on fall precautions Follow-up results of MRI of lumbar spine Will need PT evaluation

## 2022-03-14 NOTE — Assessment & Plan Note (Signed)
Patient presents to the ER for evaluation of bilateral lower extremity weakness and worsening low back pain and has had several falls at home secondary to same. We will place patient on fall precautions Follow-up results of MRI of lumbar spine PT evaluation Pain control

## 2022-03-14 NOTE — Assessment & Plan Note (Signed)
Patient with a history of obstructive sleep apnea secondary to morbid obesity CPAP at bedtime

## 2022-03-14 NOTE — Assessment & Plan Note (Signed)
Blood pressure is stable Continue amlodipine, lisinopril and metoprolol

## 2022-03-15 ENCOUNTER — Encounter: Payer: Self-pay | Admitting: Internal Medicine

## 2022-03-15 DIAGNOSIS — W19XXXA Unspecified fall, initial encounter: Secondary | ICD-10-CM | POA: Diagnosis not present

## 2022-03-15 DIAGNOSIS — I5032 Chronic diastolic (congestive) heart failure: Secondary | ICD-10-CM | POA: Diagnosis not present

## 2022-03-15 DIAGNOSIS — J9622 Acute and chronic respiratory failure with hypercapnia: Principal | ICD-10-CM

## 2022-03-15 DIAGNOSIS — J441 Chronic obstructive pulmonary disease with (acute) exacerbation: Secondary | ICD-10-CM

## 2022-03-15 DIAGNOSIS — M5441 Lumbago with sciatica, right side: Secondary | ICD-10-CM

## 2022-03-15 DIAGNOSIS — R29898 Other symptoms and signs involving the musculoskeletal system: Secondary | ICD-10-CM

## 2022-03-15 DIAGNOSIS — M5442 Lumbago with sciatica, left side: Secondary | ICD-10-CM

## 2022-03-15 DIAGNOSIS — Y92009 Unspecified place in unspecified non-institutional (private) residence as the place of occurrence of the external cause: Secondary | ICD-10-CM

## 2022-03-15 DIAGNOSIS — J9621 Acute and chronic respiratory failure with hypoxia: Secondary | ICD-10-CM | POA: Diagnosis not present

## 2022-03-15 DIAGNOSIS — N3000 Acute cystitis without hematuria: Secondary | ICD-10-CM

## 2022-03-15 DIAGNOSIS — M545 Low back pain, unspecified: Secondary | ICD-10-CM | POA: Diagnosis not present

## 2022-03-15 LAB — BASIC METABOLIC PANEL
Anion gap: 6 (ref 5–15)
BUN: 26 mg/dL — ABNORMAL HIGH (ref 8–23)
CO2: 41 mmol/L — ABNORMAL HIGH (ref 22–32)
Calcium: 9.1 mg/dL (ref 8.9–10.3)
Chloride: 91 mmol/L — ABNORMAL LOW (ref 98–111)
Creatinine, Ser: 1.22 mg/dL (ref 0.61–1.24)
GFR, Estimated: >60 mL/min (ref >60)
Glucose, Bld: 298 mg/dL — ABNORMAL HIGH (ref 70–99)
Potassium: 4.4 mmol/L (ref 3.5–5.1)
Sodium: 138 mmol/L (ref 135–145)

## 2022-03-15 LAB — BLOOD GAS, ARTERIAL
Acid-Base Excess: 15.5 mmol/L — ABNORMAL HIGH (ref 0.0–2.0)
Bicarbonate: 43.7 mmol/L — ABNORMAL HIGH (ref 20.0–28.0)
O2 Content: 4 L/min
O2 Saturation: 98.4 %
Patient temperature: 37
pCO2 arterial: 69 mmHg (ref 32–48)
pH, Arterial: 7.41 (ref 7.35–7.45)
pO2, Arterial: 84 mmHg (ref 83–108)

## 2022-03-15 LAB — GLUCOSE, CAPILLARY
Glucose-Capillary: 270 mg/dL — ABNORMAL HIGH (ref 70–99)
Glucose-Capillary: 272 mg/dL — ABNORMAL HIGH (ref 70–99)
Glucose-Capillary: 343 mg/dL — ABNORMAL HIGH (ref 70–99)
Glucose-Capillary: 390 mg/dL — ABNORMAL HIGH (ref 70–99)

## 2022-03-15 LAB — CBC
HCT: 40.8 % (ref 39.0–52.0)
Hemoglobin: 12.7 g/dL — ABNORMAL LOW (ref 13.0–17.0)
MCH: 29.1 pg (ref 26.0–34.0)
MCHC: 31.1 g/dL (ref 30.0–36.0)
MCV: 93.4 fL (ref 80.0–100.0)
Platelets: 260 10*3/uL (ref 150–400)
RBC: 4.37 MIL/uL (ref 4.22–5.81)
RDW: 13.3 % (ref 11.5–15.5)
WBC: 11.1 10*3/uL — ABNORMAL HIGH (ref 4.0–10.5)
nRBC: 0 % (ref 0.0–0.2)

## 2022-03-15 MED ORDER — TROLAMINE SALICYLATE 10 % EX CREA
TOPICAL_CREAM | Freq: Two times a day (BID) | CUTANEOUS | Status: DC | PRN
  Filled 2022-03-15: qty 85

## 2022-03-15 MED ORDER — METHOCARBAMOL 500 MG PO TABS
500.0000 mg | ORAL_TABLET | Freq: Once | ORAL | Status: AC
  Administered 2022-03-15: 500 mg via ORAL
  Filled 2022-03-15: qty 1

## 2022-03-15 MED ORDER — BACITRACIN-NEOMYCIN-POLYMYXIN OINTMENT TUBE
TOPICAL_OINTMENT | Freq: Two times a day (BID) | CUTANEOUS | Status: AC
  Filled 2022-03-15 (×2): qty 14.17

## 2022-03-15 MED ORDER — INSULIN ASPART 100 UNIT/ML IJ SOLN
5.0000 [IU] | Freq: Three times a day (TID) | INTRAMUSCULAR | Status: DC
Start: 2022-03-15 — End: 2022-03-21
  Administered 2022-03-15 – 2022-03-21 (×17): 5 [IU] via SUBCUTANEOUS
  Filled 2022-03-15 (×17): qty 1

## 2022-03-15 NOTE — Progress Notes (Addendum)
Inpatient Diabetes Program Recommendations  AACE/ADA: New Consensus Statement on Inpatient Glycemic Control (2015)  Target Ranges:  Prepandial:   less than 140 mg/dL      Peak postprandial:   less than 180 mg/dL (1-2 hours)      Critically ill patients:  140 - 180 mg/dL   Lab Results  Component Value Date   GLUCAP 270 (H) 03/15/2022   HGBA1C 10.2 (H) 01/27/2022    Review of Glycemic Control  Latest Reference Range & Units 03/14/22 21:23 03/15/22 07:58  Glucose-Capillary 70 - 99 mg/dL 106 (H) 269 (H)   Diabetes history: DM 2 Outpatient Diabetes medications:  Levemir 50 units q AM and PM Humalog 16 units tid with meals Metformin 1000 mg bid Current orders for Inpatient glycemic control:  Novolog moderate tid with meals Levemir 50 units q PM Predisone 40 mg daily  Inpatient Diabetes Program Recommendations:    Consider changing Levemir to 25 units bid (start this morning). Also consider adding Novolog 4 units tid with meals (hold if patient eats less than 50% or NPO).   Thanks,  Beryl Meager, RN, BC-ADM Inpatient Diabetes Coordinator Pager [phone]  (8a-5p)

## 2022-03-15 NOTE — Evaluation (Signed)
Occupational Therapy Evaluation Patient Details Name: Cody Alexander MRN: [id number] DOB: 08-Jul-1956 Today's Date: 03/15/2022   History of Present Illness 66 y.o. 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  Patient states that he fell walking down the stairs landing on his right buttock.  Since then he has had worsening pain in his lower back which he rates an 8 x 10 in intensity at its worst associated with bilateral lower extremity swelling and inability to walk.  He has had multiple falls over the last 5 days. MRI with results of disc protrusion.   Clinical Impression   Patient presenting with decreased Ind in self care, balance, functional mobility/transfers, endurance, and safety awareness. Patient reports living with 23 y/o roommate . 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 Pt with multiple falls recently at home and reports occasional B knee buckling with pain radiating down R LE. Pt is on O2 at baseline but reports taking it off to go outside and do yardwork. Pt stands supervision at sink to wash face and declines returning to bed secondary to wet sheets and clothing from sweat. Pt ambulates around bed without use of AD with supervision - min guard. No knee buckling noted but pt is requesting pain medication from RN. RN notified of pt needing linens changed and assist with hygiene.  Patient will benefit from acute OT to increase overall independence in the areas of ADLs, functional mobility, and safety awareness in order to safely discharge home.      Recommendations for follow up therapy are one component of a multi-disciplinary discharge planning process, led by the  attending physician.  Recommendations may be updated based on patient status, additional functional criteria and insurance authorization.   Follow Up Recommendations  Home health OT    Assistance Recommended at Discharge PRN  Patient can return home with the following A little help with walking and/or transfers;A little help with bathing/dressing/bathroom;Help with stairs or ramp for entrance    Functional Status Assessment  Patient has had a recent decline in their functional status and demonstrates the ability to make significant improvements in function in a reasonable and predictable amount of time.  Equipment Recommendations  None recommended by OT       Precautions / Restrictions Precautions Precautions: Fall Precaution Comments: back precautions for comfort Restrictions Weight Bearing Restrictions: No      Mobility Bed Mobility Overal bed mobility: Modified Independent             General bed mobility comments: HOB elevated and increased time and effort but no physical assistance    Transfers Overall transfer level: Needs assistance Equipment used: 1 person hand held assist, None Transfers: Sit to/from Stand Sit to Stand: Supervision                  Balance Overall balance assessment: Needs assistance Sitting-balance support: Feet supported Sitting balance-Leahy Scale: Good     Standing balance support: During functional activity, Single extremity supported Standing balance-Leahy Scale: Fair Standing balance comment: supervision - min guard  ADL either performed or assessed with clinical judgement   ADL                                         General ADL Comments: supervision overall without use of AD for pt to stand at sink and wash face. Pt demonstrates figure four position while seated EOB for LB clothing management.     Vision Patient Visual Report: No change from baseline               Pertinent Vitals/Pain Pain Assessment Pain Assessment: 0-10 Pain Score: 8  Pain Location: lower back Pain Descriptors / Indicators: Aching, Discomfort, Guarding Pain Intervention(s): Limited activity within patient's tolerance, Monitored during session, Repositioned, Patient requesting pain meds-RN notified     Hand Dominance Right   Extremity/Trunk Assessment Upper Extremity Assessment Upper Extremity Assessment: Generalized weakness;Overall [facility] for tasks assessed   Lower Extremity Assessment Lower Extremity Assessment: Generalized weakness       Communication Communication Communication: No difficulties   Cognition Arousal/Alertness: Awake/alert Behavior During Therapy: WFL for tasks assessed/performed Overall Cognitive Status: Within Functional Limits for tasks assessed                                                  Home Living Family/patient expects to be discharged to:: Private residence Living Arrangements: Non-relatives/Friends Available Help at Discharge: Family;Available PRN/intermittently Type of Home: Mobile home Home Access: Stairs to enter Entrance Stairs-Number of Steps: 5 Entrance Stairs-Rails: Right Home Layout: One level     Bathroom Shower/Tub: Chief Strategy Officer: Standard Bathroom Accessibility: No   Home Equipment: Cane - single point;Rolling Walker (2 wheels);Hospital bed   Additional Comments: Pt had shower chair that broke while he was sleeping in it      Prior Functioning/Environment Prior Level of Function : Independent/Modified Independent             Mobility Comments: Pt reports no use of AD at baseline ADLs Comments: Indep with ADL/IADL but increased time to complete tasks and some modifications such as wearing slip on shoes        OT Problem List: Decreased strength;Cardiopulmonary status limiting activity;Decreased activity tolerance;Decreased safety awareness;Impaired balance  (sitting and/or standing);Decreased knowledge of use of DME or AE      OT Treatment/Interventions: Self-care/ADL training;Therapeutic exercise;Therapeutic activities;Energy conservation;DME and/or AE instruction;Balance training    OT Goals(Current goals can be found in the care plan section) Acute Rehab OT Goals Patient Stated Goal: "I want to go to one of those assisted living places" OT Goal Formulation: With patient Time For Goal Achievement: 03/29/22 Potential to Achieve Goals: Fair ADL Goals Pt Will Perform Grooming: with modified independence;standing Pt Will Perform Lower Body Dressing: with modified independence;sit to/from stand Pt Will Transfer to Toilet: with modified independence;ambulating Pt Will Perform Toileting - Clothing Manipulation and hygiene: with modified independence;sit to/from stand  OT Frequency: Min 2X/week       AM-PAC OT "6 Clicks" Daily Activity     Outcome Measure Help from another person eating meals?: None Help from another person taking care of personal grooming?: None Help from another person toileting, which includes using toliet, bedpan, or urinal?: A Little Help from another person bathing (including washing, rinsing, drying)?: A  Little Help from another person to put on and taking off regular upper body clothing?: None Help from another person to put on and taking off regular lower body clothing?: A Little 6 Click Score: 21   End of Session Equipment Utilized During Treatment: Oxygen Nurse Communication: Mobility status;Patient requests pain meds;Other (comment) (pt placed in chair for assistance with hygiene and change of bed linens)  Activity Tolerance: Patient limited by pain Patient left: in chair;with call bell/phone within reach  OT Visit Diagnosis: Unsteadiness on feet (R26.81);Repeated falls (R29.6);Muscle weakness (generalized) (M62.81);History of falling (Z91.81)                Time: 1448-1856 OT Time Calculation (min): 25  min Charges:  OT General Charges $OT Visit: 1 Visit OT Evaluation $OT Eval Moderate Complexity: 1 Mod OT Treatments $Self Care/Home Management : 8-22 mins  Jackquline Denmark, MS, OTR/L , CBIS ascom [phone]  03/15/22, 11:35 AM

## 2022-03-15 NOTE — Consult Note (Signed)
Referring Physician:  No referring provider defined for this encounter.  Primary Physician:  Gildardo Pounds, PA  History of Present Illness: 03/15/2022 Mr. Cody Alexander is here with a chief complaint of back pain and difficulty breathing.  He has baseline COPD on 4L oxygen.  He has had multiple exacerbations of back and leg pain.  He had a fall a few days ago that precipitated this episode of back pain.  He has pain down both legs at times.  He has history of neuropathy, which has left him with altered sensation in his legs.  He has no weakness.  Review of Systems:  A 10 point review of systems is negative, except for the pertinent positives and negatives detailed in the HPI.  Past Medical History: Past Medical History:  Diagnosis Date   CHF (congestive heart failure) (HCC)    COPD (chronic obstructive pulmonary disease) (HCC)    Diabetes mellitus without complication (HCC)    Hypertension    Neuropathy     Past Surgical History: Past Surgical History:  Procedure Laterality Date   LEFT HEART CATH AND CORONARY ANGIOGRAPHY Right 02/27/2018   Procedure: LEFT HEART CATH AND CORONARY ANGIOGRAPHY;  Surgeon: Laurier Nancy, MD;  Location: [facility] INVASIVE CV LAB;  Service: Cardiovascular;  Laterality: Right;    Allergies: Allergies as of 03/14/2022 - Review Complete 03/14/2022  Allergen Reaction Noted   Erythromycin Anaphylaxis and Swelling 09/25/2016    Medications: No outpatient medications have been marked as taking for the 03/14/22 encounter [facility] Encounter).    Social History: Social History   Tobacco Use   Smoking status: Never   Smokeless tobacco: Never  Substance Use Topics   Alcohol use: No   Drug use: Never    Family Medical History: Family History  Problem Relation Age of Onset   Stroke Mother    Hypertension Father     Physical Examination: Vitals:   03/15/22 1534 03/15/22 2035  BP: 115/78 135/78  Pulse: 73 76  Resp: 17 20  Temp:  97.9 F (36.6 C) 97.6 F (36.4 C)  SpO2: 95% 98%    General: Patient is well developed, well nourished, calm, collected, and in no apparent distress. Attention to examination is appropriate.  Neck:   Supple.  Full range of motion.  Respiratory: Patient is breathing without any difficulty on 4L oxygen.   NEUROLOGICAL:     Awake, alert, oriented to person, place, and time.  Speech is clear and fluent. Fund of knowledge is appropriate.   Cranial Nerves: Pupils equal round and reactive to light.  Facial tone is symmetric.  Facial sensation is symmetric. Shoulder shrug is symmetric. Tongue protrusion is midline.  There is no pronator drift.  ROM of spine: full.    Strength: Side Biceps Triceps Deltoid Interossei Grip Wrist Ext. Wrist Flex.  R 5 5 5 5 5 5 5   L 5 5 5 5 5 5 5    Side Iliopsoas Quads Hamstring PF DF EHL  R 5 5 5 5 5 5   L 5 5 5 5 5 5    Reflexes are 1+ and symmetric at the biceps, triceps, brachioradialis, patella and achilles.   Hoffman's is absent.  Clonus is not present.  Toes are down-going.  Bilateral upper and lower extremity sensation is symmetric to light touch.   He is numb below mid-thigh bilaterally. No evidence of dysmetria noted.  Gait not tested  Medical Decision Making  Imaging: MRI L spine 03/14/2022 IMPRESSION: 1. Disc protrusion  at L4-L5 which contacts the traversing left L5 nerve root is slightly increased in size since the prior study from 08/15/2020. Mild-to-moderate left and mild right neural foraminal stenosis at this level is not significantly changed. 2. Overall mild degenerative changes at the other levels as above are overall stable since 2021 without high-grade spinal canal or neural foraminal stenosis or other evidence of nerve root impingement.     Electronically Signed   By: Lesia Hausen M.D.   On: 03/14/2022 13:31    I have personally reviewed the images and agree with the above interpretation.  Assessment and Plan: Mr.  Cody Alexander is a pleasant 66 y.o. male with exacerbation of back and leg pain.  He has baseline COPD and CHF that require he be on continuous oxygen.  - No surgery indicated - Consider NSAIDs once off steroids - Would limit narcotics as much as possible - PT daily - May need injections at some point. I would recommend outpatient referral to [facility] for consideration of injections - He is not a surgical candidate. He should follow up with his PCP and with [facility] pain to manage his symptoms on an ongoing basis.       Arista Kettlewell K. Myer Haff MD, [facility]

## 2022-03-15 NOTE — Progress Notes (Addendum)
Progress Note    [place]in Board  [id number] DOB: 11-30-1955  DOA: 03/14/2022 PCP: Rutherford Limerick, PA      Brief Narrative:    Medical records reviewed and are as summarized below:  Cody Alexander is a 66 y.o. 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  He said he fell while walking down the steps and landed on his buttocks.  He has had difficulty walking because of the back pain.  He also complained of dysuria, swelling in bilateral legs, shortness of breath and dry cough.  He was admitted to the hospital for acute on chronic low back pain.  MRI lumbar spine showed disc protrusion at L4-L5 which contacts the traversing left L5 nerve root (slightly increased in size since prior study from 08/15/2020). He was also found to have acute on chronic hypoxic and hypercapnic respiratory failure with increased work of breathing.  He was placed on BiPAP initially for acute respiratory failure.  Acute respiratory failure suspected to be from COPD exacerbation.       Assessment/Plan:   Principal Problem:   Acute on chronic respiratory failure (HCC) Active Problems:   COPD exacerbation (HCC)   Chronic diastolic CHF (congestive heart failure) (HCC)   Acute exacerbation of chronic low back pain   Essential hypertension   Leg weakness, bilateral   Fall at home, initial encounter   Obesity (BMI 30-39.9)   OSA (obstructive sleep apnea)   UTI (urinary tract infection)   Diabetes mellitus without complication (HCC)    Body mass index is 40.4 kg/m.  (Morbid obesity)  Acute on chronic hypoxic and hypercapnic respiratory  failure: He is off BiPAP and tolerating 4 L/min oxygen via nasal cannula (at baseline).  ABG showed pH 7.41, PCO2 69 and oxygen saturation of 98.4% on 4 L/min oxygen.  He may qualify for trilogy machine at home.  Discussed with Judson Roch, Education officer, museum to secure a trilogy machine for patient.  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  Analgesics as needed for pain.  PT and OT evaluation.  PT recommended discharge to SNF.  Chronic diastolic CHF, hypertension: Continue diuretics and antihypertensives  Insulin-dependent diabetes mellitus with hyperglycemia: Continue Levemir and NovoLog.  Suspected acute UTI.  He complained of dysuria.  Urine culture has been ordered.  Continue IV ceftriaxone for now.    Diet Order             Diet Carb Modified Fluid consistency: Thin; Room service appropriate? Yes  Diet effective now                            Consultants: Neurosurgeon  Procedures: None    Medications:    amLODipine  10 mg Oral Daily   atorvastatin  40 mg  Oral Daily   enoxaparin (LOVENOX) injection  0.5 mg/kg Subcutaneous Q24H   furosemide  80 mg Oral BID   insulin aspart  0-15 Units Subcutaneous TID WC   insulin aspart  5 Units Subcutaneous TID WC   insulin detemir  50 Units Subcutaneous Q2200   isosorbide mononitrate  30 mg Oral Daily   lisinopril  20 mg Oral Daily   metoprolol tartrate  25 mg Oral BID   mometasone-formoterol  2 puff Inhalation BID   neomycin-bacitracin-polymyxin   Topical BID   predniSONE  40 mg Oral Q breakfast   sodium  chloride flush  3 mL Intravenous Q12H   spironolactone  12.5 mg Oral Daily   Continuous Infusions:  sodium chloride 250 mL (03/15/22 0951)   cefTRIAXone (ROCEPHIN)  IV 1 g (03/15/22 0951)     Anti-infectives (From admission, onward)    Start     Dose/Rate Route Frequency Ordered Stop   03/14/22 1000  cefTRIAXone (ROCEPHIN) 1 g in sodium chloride 0.9 % 100 mL IVPB        1 g 200 mL/hr over 30 Minutes Intravenous Every 24 hours 03/14/22 0850 03/17/22 0959              Family Communication/Anticipated D/C date and plan/Code Status   DVT prophylaxis:      Code Status: Full Code  Family Communication: None Disposition Plan: To be determined.  He may need to go to SNF   Status is: Inpatient Remains inpatient appropriate because: COPD exacerbation, acute on chronic back pain, UTI on IV antibiotics       Subjective:   Interval events noted.  He complains of severe low back pain.  He requested to have a shower because he has been sweating so much.  He complains of pain from right buttock wound that he developed following a fall at home.  Objective:    Vitals:   03/14/22 2354 03/15/22 0409 03/15/22 0818 03/15/22 1151  BP: 101/64 117/74 (!) 118/95 122/77  Pulse: 69 64 85 69  Resp: 18 20  19   Temp: 98.4 F (36.9 C) 97.6 F (36.4 C) 98.1 F (36.7 C) 98 F (36.7 C)  TempSrc: Oral Axillary    SpO2: 98% 97% (!) 89% 98%  Weight:      Height:       No data found.   Intake/Output Summary (Last 24 hours) at 03/15/2022 1514 Last data filed at 03/15/2022 1358 Gross per 24 hour  Intake 1531.67 ml  Output 2245 ml  Net -713.33 ml   Filed Weights   03/14/22 0541 03/14/22 1632  Weight: 131.5 kg (!) 138.9 kg    Exam:  GEN: NAD SKIN: Right buttock wound from recent fall. Abrasion on left buttock EYES: EOMI ENT: MMM CV: RRR PULM: Decreased air entry bilaterally, bilateral expiratory wheezing, no rales heard ABD: soft, obese, NT, +BS CNS: AAO x 3, non  focal EXT: Bilateral leg edema, no tenderness MSK: no lumbar spinal or paraspinal tenderness        Data Reviewed:   I have personally reviewed following labs and imaging studies:  Labs: Labs show the following:   Basic Metabolic Panel: Recent Labs  Lab 03/14/22 0551 03/15/22 0446  NA 140 138  K 3.8 4.4  CL 93* 91*  CO2 41* 41*  GLUCOSE 171* 298*  BUN 28* 26*  CREATININE 1.27* 1.22  CALCIUM 9.1 9.1   GFR Estimated Creatinine Clearance: 88.4 mL/min (by C-G formula based on SCr of 1.22 mg/dL).  Liver Function Tests: Recent Labs  Lab 03/14/22 0551  AST 17  ALT 18  ALKPHOS 55  BILITOT 0.9  PROT 7.2  ALBUMIN 3.9   No results for input(s): "LIPASE", "AMYLASE" in the last 168 hours. No results for input(s): "AMMONIA" in the last 168 hours. Coagulation profile No results for input(s): "INR", "PROTIME" in the last 168 hours.  CBC: Recent Labs  Lab 03/14/22 0551 03/15/22 0446  WBC 7.5 11.1*  HGB 13.3 12.7*  HCT 43.8 40.8  MCV 94.0 93.4  PLT 238 260   Cardiac Enzymes: No results for input(s): "CKTOTAL", "CKMB", "CKMBINDEX", "TROPONINI" in the last 168 hours. BNP (last 3 results) No results for input(s): "PROBNP" in the last 8760 hours. CBG: Recent Labs  Lab 03/14/22 1222 03/14/22 1647 03/14/22 2123 03/15/22 0758 03/15/22 1148  GLUCAP 186* 330* 369* 270* 272*   D-Dimer: No results for input(s): "DDIMER" in the last 72 hours. Hgb A1c: No results for input(s): "HGBA1C" in the last 72 hours. Lipid Profile: No results for input(s): "CHOL", "HDL", "LDLCALC", "TRIG", "CHOLHDL", "LDLDIRECT" in the last 72 hours. Thyroid function studies: No results for input(s): "TSH", "T4TOTAL", "T3FREE", "THYROIDAB" in the last 72 hours.  Invalid input(s): "FREET3" Anemia work up: No results for input(s): "VITAMINB12", "FOLATE", "FERRITIN", "TIBC", "IRON", "RETICCTPCT" in the last 72 hours. Sepsis Labs: Recent Labs  Lab 03/14/22 0551 03/15/22 0446  WBC 7.5 11.1*     Microbiology No results found for this or any previous visit (from the past 240 hour(s)).  Procedures and diagnostic studies:  MR LUMBAR SPINE WO CONTRAST  Result Date: 03/14/2022 CLINICAL DATA:  History of degenerative disc disease, low back pain EXAM: MRI LUMBAR SPINE WITHOUT CONTRAST TECHNIQUE: Multiplanar, multisequence MR imaging of the lumbar spine was performed. No intravenous contrast was administered. COMPARISON:  Lumbar spine MRI 08/15/2020 FINDINGS: Segmentation: Standard; the lowest formed disc space is designated L5-S1. Alignment: There is trace retrolisthesis of L2 on L3 and L3 on L4, unchanged. There is mild levocurvature centered at L2, similar to the prior study. Vertebrae: Vertebral body heights are preserved. Background marrow signal is normal. There is a prominent Schmorl's node indenting the superior T12 endplate which is increased since 2021 but without significant marrow edema. There is mild degenerative endplate marrow signal abnormality at L2-L3 through L5-S1 without edema. There is no suspicious marrow signal abnormality or evidence of acute injury. Conus medullaris and cauda equina: Conus extends to the T12-L1 level. Conus and cauda equina appear normal. Paraspinal and other soft tissues: Unremarkable. Disc levels: There is overall mild multilevel disc desiccation and narrowing, similar to 2021. T12-L1: No significant spinal canal or neural foraminal stenosis. L1-L2: There is a minimal disc bulge without significant spinal canal or neural foraminal stenosis, unchanged. L2-L3: There is a mild disc bulge and mild facet arthropathy with trace effusions without significant spinal canal or neural foraminal stenosis, unchanged. L3-L4: There is a mild disc bulge, degenerative endplate spurring, and mild facet arthropathy with small effusions resulting in mild bilateral neural foraminal stenosis without significant spinal canal stenosis, unchanged. L4-L5: There is a mild disc bulge  with superimposed central protrusion, degenerative endplate spurring, and mild facet arthropathy with small effusions resulting in mild narrowing of the left subarticular zone with contact of the traversing left L5 nerve root and mild-to-moderate left and mild right neural foraminal stenosis. The disc protrusion is slightly increased in size since 2021. L5-S1: No significant spinal canal or neural foraminal stenosis. IMPRESSION: 1. Disc protrusion at L4-L5 which contacts the  traversing left L5 nerve root is slightly increased in size since the prior study from 08/15/2020. Mild-to-moderate left and mild right neural foraminal stenosis at this level is not significantly changed. 2. Overall mild degenerative changes at the other levels as above are overall stable since 2021 without high-grade spinal canal or neural foraminal stenosis or other evidence of nerve root impingement. Electronically Signed   By: Lesia Hausen M.D.   On: 03/14/2022 13:31   DG Chest Portable 1 View  Result Date: 03/14/2022 CLINICAL DATA:  Provided history: Shortness of breath, evaluate for edema. EXAM: PORTABLE CHEST 1 VIEW COMPARISON:  Prior chest radiographs 01/27/2022 and earlier FINDINGS: Heart size at the upper limits of normal. Aortic atherosclerosis. Foci of linear atelectasis within the left mid lung. No appreciable airspace consolidation or pulmonary edema. No evidence of pleural effusion or pneumothorax. No acute bony abnormality identified. IMPRESSION: 1. Foci of linear atelectasis within the left mid lung. 2. Otherwise, no evidence of acute cardiopulmonary abnormality. 3. Aortic Atherosclerosis (ICD10-I70.0). Electronically Signed   By: Jackey Loge D.O.   On: 03/14/2022 09:37   US Venous Img Lower Bilateral  Result Date: 03/14/2022 CLINICAL DATA:  Leg pain and swelling for 5 days. EXAM: BILATERAL LOWER EXTREMITY VENOUS DOPPLER ULTRASOUND TECHNIQUE: Gray-scale sonography with compression, as well as color and duplex  ultrasound, were performed to evaluate the deep venous system(s) from the level of the common femoral vein through the popliteal and proximal calf veins. COMPARISON:  None Available. FINDINGS: VENOUS Normal compressibility of the common femoral, superficial femoral, and popliteal veins, as well as the visualized calf veins. Visualized portions of profunda femoral vein and great saphenous vein unremarkable. No filling defects to suggest DVT on grayscale or color Doppler imaging. Doppler waveforms show normal direction of venous flow, normal respiratory plasticity and response to augmentation. Limited views of the contralateral common femoral vein are unremarkable. OTHER None. Limitations: none IMPRESSION: Negative. Electronically Signed   By: Larose Hires D.O.   On: 03/14/2022 08:42   DG Lumbar Spine Complete  Result Date: 03/14/2022 CLINICAL DATA:  Back pain EXAM: LUMBAR SPINE - COMPLETE 4+ VIEW COMPARISON:  MRI lumbar spine done on 08/15/2020 FINDINGS: No recent fracture is seen. Alignment of posterior margins of the vertebral bodies is unremarkable. Degenerative changes are noted with disc space narrowing, bony spurs and facet hypertrophy throughout the lumbar spine. No significant interval changes are noted. IMPRESSION: No recent fracture is seen in the lumbar spine. Moderate to marked degenerative changes are noted with disc space narrowing, bony spurs and facet hypertrophy at multiple levels. Electronically Signed   By: Ernie Avena M.D.   On: 03/14/2022 08:11               LOS: 1 day   Yisel Megill  [facility]   Pager on [email or web address]. If 7PM-7AM, please contact night-coverage at [email or web address]     03/15/2022, 3:14 PM

## 2022-03-15 NOTE — TOC Initial Note (Signed)
Transition of Care [facility]) - Initial/Assessment Note    Patient Details  Name: Cody Alexander MRN: [id number] Date of Birth: [birth year]  [facility] CM/SW Contact:    Candie Chroman, LCSW Phone Number: 03/15/2022, 3:53 PM  Clinical Narrative:    CSW met with patient. No supports at bedside. CSW introduced role and explained that PT recommendations would be discussed. Patient is agreeable to SNF placement. He is interested in ALF placement after rehab. Included this information on referral. He lives with a roommate that is 70 years old. No further concerns. CSW encouraged patient to contact CSW as needed. CSW will continue to follow patient for support and facilitate discharge to SNF once medically stable.              Expected Discharge Plan: [place] Barriers to Discharge: Continued Medical Work up   Patient Goals and CMS Choice        Expected Discharge Plan and Services Expected Discharge Plan: [place] Choice: [place] arrangements for the past 2 months: Single Family Home                                      Prior Living Arrangements/Services Living arrangements for the past 2 months: Single Family Home Lives with:: Roommate Patient language and need for interpreter reviewed:: Yes Do you feel safe going back to the place where you live?: Yes            Criminal Activity/Legal Involvement Pertinent to Current Situation/Hospitalization: No - Comment as needed  Activities of Daily Living Home Assistive Devices/Equipment: Oxygen, Walker (specify type), Wheelchair ADL Screening (condition at time of admission) Patient's cognitive ability adequate to safely complete daily activities?: No Is the patient deaf or have difficulty hearing?: No Does the patient have difficulty seeing, even when wearing glasses/contacts?: No Does the patient have difficulty concentrating,  remembering, or making decisions?: No Patient able to express need for assistance with ADLs?: Yes Does the patient have difficulty dressing or bathing?: Yes Independently performs ADLs?: No Does the patient have difficulty walking or climbing stairs?: Yes Weakness of Legs: Both Weakness of Arms/Hands: Both  Permission Sought/Granted Permission sought to share information with : Facility Art therapist granted to share information with : Yes, Verbal Permission Granted     Permission granted to share info w AGENCY: SNF's        Emotional Assessment Appearance:: Appears stated age Attitude/Demeanor/Rapport: Engaged, Gracious Affect (typically observed): Accepting, Appropriate, Calm, Pleasant Orientation: : Oriented to Self, Oriented to Place, Oriented to  Time, Oriented to Situation Alcohol / Substance Use: Not Applicable Psych Involvement: No (comment)  Admission diagnosis:  Acute respiratory failure with hypercapnia (HCC) [J96.02] Acute on chronic respiratory failure (HCC) [J96.20] Acute low back pain, unspecified back pain laterality, unspecified whether sciatica present [M54.50] Patient Active Problem List   Diagnosis Date Noted   Acute on chronic respiratory failure ([place]) 03/14/2022   Diabetes mellitus without complication (HCC)    Acute exacerbation of chronic low back pain    UTI (urinary tract infection)    HLD (hyperlipidemia) 01/27/2022   CAD (coronary artery disease) 01/27/2022   Fall at home, initial encounter 01/27/2022   Leg weakness, bilateral 01/27/2022   Hyperkalemia    Acute CHF (congestive heart failure) ([place]) 08/15/2021   Benign prostatic hyperplasia with urinary hesitancy  Lower abdominal pain    Elevated troponin    Rash    Uncontrolled type 2 diabetes mellitus with hyperglycemia (HCC) 01/04/2021   Chronic diastolic CHF (congestive heart failure) ([place]) 01/04/2021   CHF (congestive heart failure), NYHA class I, acute, diastolic ([place])  47/04/6282   OSA (obstructive sleep apnea) 01/04/2021   Obesity (BMI 30-39.9) 01/04/2021   Chronic respiratory failure with hypoxia ([place]) 01/04/2021   Acute on chronic congestive heart failure (HCC)    Acute on chronic respiratory failure with hypoxia ([place]) 06/20/2020   NSTEMI (non-ST elevated myocardial infarction) ([place]) 02/25/2018   Obesity, Class III, BMI 40-49.9 (morbid obesity) ([place]) 01/19/2017   COPD exacerbation ([place]) 05/13/2014   Type 2 diabetes mellitus with hyperglycemia, with long-term current use of insulin ([place]) 05/13/2014   Essential hypertension 05/13/2014   PCP:  Rutherford Limerick, PA Pharmacy:   [id number] [place], [id number]: [phone]: [phone]    Social Determinants of Health (SDOH) Interventions    Readmission Risk Interventions     No data to display

## 2022-03-15 NOTE — NC FL2 (Signed)
[place] MEDICAID FL2 LEVEL OF CARE SCREENING TOOL     IDENTIFICATION  [name] Birthdate: [birth year] Sex: male Admission Date (Current Location): 03/14/2022  [place] and [place] Number:  Chiropodist and Address:  [facility], [address] [place], [place] [address]      Provider Number: [id number]  Attending Physician Name and Address:  Lurene Shadow, MD  Relative Name and Phone Number:       Current Level of Care: Hospital Recommended Level of Care: Skilled Nursing Facility Prior Approval Number:    Date Approved/Denied:   PASRR Number: [id number]  Discharge Plan: SNF    Current Diagnoses: Patient Active Problem List   Diagnosis Date Noted   Acute on chronic respiratory failure (HCC) 03/14/2022   Diabetes mellitus without complication (HCC)    Acute exacerbation of chronic low back pain    UTI (urinary tract infection)    HLD (hyperlipidemia) 01/27/2022   CAD (coronary artery disease) 01/27/2022   Fall at home, initial encounter 01/27/2022   Leg weakness, bilateral 01/27/2022   Hyperkalemia    Acute CHF (congestive heart failure) (HCC) 08/15/2021   Benign prostatic hyperplasia with urinary hesitancy    Lower abdominal pain    Elevated troponin    Rash    Uncontrolled type 2 diabetes mellitus with hyperglycemia (HCC) 01/04/2021   Chronic diastolic CHF (congestive heart failure) (HCC) 01/04/2021   CHF (congestive heart failure), NYHA class I, acute, diastolic (HCC) 01/04/2021   OSA (obstructive sleep apnea) 01/04/2021   Obesity (BMI 30-39.9) 01/04/2021   Chronic respiratory failure with hypoxia (HCC) 01/04/2021   Acute on chronic congestive heart failure (HCC)    Acute on chronic respiratory failure with hypoxia (HCC) 06/20/2020   NSTEMI (non-ST elevated myocardial infarction) (HCC) 02/25/2018   Obesity, Class III, BMI 40-49.9 (morbid obesity) (HCC) 01/19/2017   COPD exacerbation (HCC) 05/13/2014    Type 2 diabetes mellitus with hyperglycemia, with long-term current use of insulin (HCC) 05/13/2014   Essential hypertension 05/13/2014    Orientation RESPIRATION BLADDER Height & Weight     Self, Time, Situation, Place  O2, Other (Comment) (Nasal Cannula 4 L. Bipap QHS: 15/6 at 35%.) Continent Weight: (!) 306 lb 3.5 oz (138.9 kg) Height:  6\' 1"  (185.4 cm)  BEHAVIORAL SYMPTOMS/MOOD NEUROLOGICAL BOWEL NUTRITION STATUS   (None)  (None) Continent Diet (Carb modified)  AMBULATORY STATUS COMMUNICATION OF NEEDS Skin   Supervision Verbally Normal                       Personal Care Assistance Level of Assistance  Bathing, Feeding, Dressing Bathing Assistance: Limited assistance Feeding assistance: Limited assistance Dressing Assistance: Limited assistance     Functional Limitations Info  Sight, Hearing, Speech Sight Info: Adequate Hearing Info: Adequate Speech Info: Adequate    SPECIAL CARE FACTORS FREQUENCY  PT (By licensed PT), OT (By licensed OT)     PT Frequency: 5 x week OT Frequency: 5 x week            Contractures Contractures Info: Not present    Additional Factors Info  Code Status, Allergies Code Status Info: Full code Allergies Info: Erythromycin           Current Medications (03/15/2022):  This is the current hospital active medication list Current Facility-Administered Medications  Medication Dose Route Frequency Provider Last Rate Last Admin   0.9 %  sodium chloride infusion  250 mL Intravenous PRN Agbata, Tochukwu, MD 5  mL/hr at 03/15/22 0951 250 mL at 03/15/22 0951   acetaminophen (TYLENOL) tablet 1,000 mg  1,000 mg Oral Q6H PRN Agbata, Tochukwu, MD       amLODipine (NORVASC) tablet 10 mg  10 mg Oral Daily Agbata, Tochukwu, MD   10 mg at 03/15/22 0925   atorvastatin (LIPITOR) tablet 40 mg  40 mg Oral Daily Agbata, Tochukwu, MD   40 mg at 03/15/22 0926   cefTRIAXone (ROCEPHIN) 1 g in sodium chloride 0.9 % 100 mL IVPB  1 g Intravenous Q24H  Agbata, Tochukwu, MD 200 mL/hr at 03/15/22 0951 1 g at 03/15/22 0951   enoxaparin (LOVENOX) injection 65 mg  0.5 mg/kg Subcutaneous Q24H Agbata, Tochukwu, MD   65 mg at 03/14/22 2125   furosemide (LASIX) tablet 80 mg  80 mg Oral BID Agbata, Tochukwu, MD   80 mg at 03/15/22 0925   insulin aspart (novoLOG) injection 0-15 Units  0-15 Units Subcutaneous TID WC Agbata, Tochukwu, MD   8 Units at 03/15/22 1214   insulin aspart (novoLOG) injection 5 Units  5 Units Subcutaneous TID WC Lurene Shadow, MD       insulin detemir (LEVEMIR) injection 50 Units  50 Units Subcutaneous Q2200 Agbata, Tochukwu, MD       ipratropium-albuterol (DUONEB) 0.5-2.5 (3) MG/3ML nebulizer solution 3 mL  3 mL Nebulization Q6H PRN Agbata, Tochukwu, MD       isosorbide mononitrate (IMDUR) 24 hr tablet 30 mg  30 mg Oral Daily Agbata, Tochukwu, MD   30 mg at 03/15/22 0925   lisinopril (ZESTRIL) tablet 20 mg  20 mg Oral Daily Agbata, Tochukwu, MD   20 mg at 03/15/22 0926   metoprolol tartrate (LOPRESSOR) tablet 25 mg  25 mg Oral BID Agbata, Tochukwu, MD   25 mg at 03/15/22 0925   mometasone-formoterol (DULERA) 200-5 MCG/ACT inhaler 2 puff  2 puff Inhalation BID Agbata, Tochukwu, MD   2 puff at 03/15/22 1214   neomycin-bacitracin-polymyxin (NEOSPORIN) ointment   Topical BID Lurene Shadow, MD   Given at 03/15/22 1517   ondansetron (ZOFRAN) tablet 4 mg  4 mg Oral Q6H PRN Agbata, Tochukwu, MD       Or   ondansetron (ZOFRAN) injection 4 mg  4 mg Intravenous Q6H PRN Agbata, Tochukwu, MD       oxyCODONE-acetaminophen (PERCOCET/ROXICET) 5-325 MG per tablet 1 tablet  1 tablet Oral Q4H PRN Agbata, Tochukwu, MD   1 tablet at 03/15/22 1108   predniSONE (DELTASONE) tablet 40 mg  40 mg Oral Q breakfast Agbata, Tochukwu, MD   40 mg at 03/15/22 0926   sodium chloride flush (NS) 0.9 % injection 3 mL  3 mL Intravenous Q12H Agbata, Tochukwu, MD   3 mL at 03/15/22 1047   sodium chloride flush (NS) 0.9 % injection 3 mL  3 mL Intravenous PRN Agbata,  Tochukwu, MD       spironolactone (ALDACTONE) tablet 12.5 mg  12.5 mg Oral Daily Agbata, Tochukwu, MD   12.5 mg at 03/15/22 2330     Discharge Medications: Please see discharge summary for a list of discharge medications.  Relevant Imaging Results:  Relevant Lab Results:   Additional Information SS#: [id number]. Interested in ALF placement after rehab if possible. [facility] will be following for trilogy after he discharges from SNF.  Margarito Liner, LCSW

## 2022-03-15 NOTE — TOC CM/SW Note (Signed)
Mr. Cosby presents with acute on chronic respiratory failure due to COPD.  The use of the NIV will treat patient's high PC02 levels (69 with an elevated bicarb of 43.7).  Therefore, NIV use can reduce risk of exacerbations and future hospitalizations when used at night and during the day.  All alternate devices [phone] and [id number], specifically a BiPAP ST 15/6 with a backup rate of 20) BiLevel/RAD have been considered and ruled out as patient requires continuous alarms, backup battery, and portability which are not possible with BiLevel/RAD devices.  An NIV with volume-targeted pressure support is necessary to prevent patient from life-threatening harm.  Interruption or failure to provide NIV would quickly lead to exacerbation of the patient's condition, hospital re-admission (this is the patient's second hospitalization since June 2023), and likely harm to the patient. Continued use is preferred.  Patient is able to protect their airways and clear secretions on their own.

## 2022-03-15 NOTE — Progress Notes (Signed)
PT Cancellation Note  Patient Details Name: Cody Alexander MRN: [id number] DOB: 12/21/1955   Cancelled Treatment:    Reason Eval/Treat Not Completed: Patient not medically ready PT orders received, chart reviewed. Attending reports plans to consult neurosurgery. Will hold PT evaluation until neurosurgery consult has been completed.   Aleda Grana, PT, DPT 03/15/22, 10:37 AM  Sandi Mariscal 03/15/2022, 10:37 AM

## 2022-03-15 NOTE — TOC CM/SW Note (Signed)
Per MD, patient may benefit from NIV. Referral made to [facility].  Charlynn Court, CSW [phone]

## 2022-03-15 NOTE — Evaluation (Signed)
Physical Therapy Evaluation Patient Details Name: Cody Alexander MRN: [id number] DOB: June 06, 1956 Today's Date: 03/15/2022  History of Present Illness  Pt is a 66 y/o M admitted on 03/14/22 after presenting to the ED with c/o worsening back pain over the last 5 days. Pt reports he fell down the stairs & landed on his R buttock & has had worsening LBP, BLE swelling, & inability to walk. Pt endorses multiple falls over the last 5 days. Pt is being treated for acute on chronic respiratory failure & acute exacerbation of LBP. MRI with results of disc protrusion.  PMH: COPD with chronic respiratory failure on 4L O2, IDDM, HTN, chronic diastolic dysfunction CHF, chronic LBP, neuropathy  Clinical Impression  MD cleared pt for participation in PT session. Pt received in recliner reporting prior to admission he was independent without AD, living with a 9 y/o roommate, independent with all mobility & outside yard work, & driving a motorcycle. On this date, pt requires supervision for STS & at least 1UE support for gait in room. Pt with decreased RLE strength & also pain so PT provided pt with RW & pt with improved safety with mobility with AD. Pt inquiring about rehab following d/c & pt would benefit from STR upon d/c to maximize independence with functional mobility & reduce fall risk prior to return home. Will continue to follow pt acutely to address balance, strengthening, and gait with LRAD.    Recommendations for follow up therapy are one component of a multi-disciplinary discharge planning process, led by the attending physician.  Recommendations may be updated based on patient status, additional functional criteria and insurance authorization.  Follow Up Recommendations Skilled nursing-short term rehab (<3 hours/day) Can patient physically be transported by private vehicle: Yes    Assistance Recommended at Discharge Intermittent Supervision/Assistance  Patient can return home with the following  A  little help with walking and/or transfers;A little help with bathing/dressing/bathroom;Assistance with cooking/housework;Assist for transportation;Direct supervision/assist for medications management;Help with stairs or ramp for entrance    Equipment Recommendations None recommended by PT (pt reports he has a RW)  Recommendations for Other Services       Functional Status Assessment Patient has had a recent decline in their functional status and demonstrates the ability to make significant improvements in function in a reasonable and predictable amount of time.     Precautions / Restrictions Precautions Precautions: Fall Precaution Comments: back precautions for comfort Restrictions Weight Bearing Restrictions: No      Mobility  Bed Mobility               General bed mobility comments: not observed, pt received & left sitting in recliner    Transfers Overall transfer level: Needs assistance Equipment used: None Transfers: Sit to/from Stand Sit to Stand: Supervision                Ambulation/Gait Ambulation/Gait assistance: Supervision Gait Distance (Feet): 5 Feet Assistive device: None Gait Pattern/deviations: Decreased stance time - right, Decreased stride length, Decreased step length - right, Decreased step length - left Gait velocity: decreased     General Gait Details: Pt ambulates without AD but holds to tray table, bed, etc throughout gait with at least 1 UE. Pt with decreased weight shifting RLE & decreased weight bearing through RLE. Provided pt with RW & pt ambulated + 15 ft with cuing for proper use of AD but pt with forward trunk lean & BUE reliance on AD.  Stairs  Wheelchair Mobility    Modified Rankin (Stroke Patients Only)       Balance Overall balance assessment: Needs assistance Sitting-balance support: Feet supported Sitting balance-Leahy Scale: Good     Standing balance support: During functional activity, Single  extremity supported Standing balance-Leahy Scale: Fair                               Pertinent Vitals/Pain Pain Assessment Pain Assessment: Faces Faces Pain Scale: Hurts even more (pt also c/o "pin-like" in his "heart" that has been ongoing for ~7 days but is improving - MD Notified) Pain Location: R lower back, RLE Pain Descriptors / Indicators: Grimacing, Discomfort, Guarding Pain Intervention(s): Monitored during session, Limited activity within patient's tolerance    Home Living Family/patient expects to be discharged to:: Private residence Living Arrangements: Non-relatives/Friends Available Help at Discharge: Family;Available PRN/intermittently Type of Home: Mobile home Home Access: Stairs to enter Entrance Stairs-Rails: Left (rail on L side, trailer on R side) Entrance Stairs-Number of Steps: 5   Home Layout: One level Home Equipment: Cane - single point;Rolling Walker (2 wheels) Additional Comments: Endorses 2 falls prior to admission.    Prior Function Prior Level of Function : Independent/Modified Independent             Mobility Comments: Pt reports he's independent without AD, drives motorcycle, cooks, cleans, performs yardwork (removes O2 to do this) ADLs Comments: cooks, Conservation officer, historic buildings Dominance   Dominant Hand: Right    Extremity/Trunk Assessment   Upper Extremity Assessment Upper Extremity Assessment: Overall WFL for tasks assessed    Lower Extremity Assessment Lower Extremity Assessment: RLE deficits/detail RLE Deficits / Details: 2/5 R knee extension in sitting 2/2 pain & weakness       Communication   Communication: No difficulties  Cognition Arousal/Alertness: Awake/alert Behavior During Therapy: WFL for tasks assessed/performed Overall Cognitive Status: Within Functional Limits for tasks assessed                                          General Comments General comments (skin integrity, edema, etc.): Pt  on 4L/min via nasal cannula with SPO2 88% after gait, but increasing to >/= 90% with rest. HR 74 bpm at end of session. Educated pt on importance of using supplemental O2 at all times at home (as pt reports he removes it to perform yardwork).    Exercises     Assessment/Plan    PT Assessment Patient needs continued PT services  PT Problem List Decreased strength;Cardiopulmonary status limiting activity;Pain;Decreased range of motion;Decreased activity tolerance;Decreased balance;Decreased knowledge of use of DME;Decreased mobility       PT Treatment Interventions Therapeutic exercise;DME instruction;Balance training;Gait training;Stair training;Neuromuscular re-education;Functional mobility training;Patient/family education;Therapeutic activities    PT Goals (Current goals can be found in the Care Plan section)  Acute Rehab PT Goals Patient Stated Goal: decreased pain, go to rehab PT Goal Formulation: With patient Time For Goal Achievement: 03/29/22 Potential to Achieve Goals: Good    Frequency Min 2X/week     Co-evaluation               AM-PAC PT "6 Clicks" Mobility  Outcome Measure Help needed turning from your back to your side while in a flat bed without using bedrails?: None Help needed moving from lying on your back to sitting on  the side of a flat bed without using bedrails?: None Help needed moving to and from a bed to a chair (including a wheelchair)?: A Little Help needed standing up from a chair using your arms (e.g., wheelchair or bedside chair)?: A Little Help needed to walk in hospital room?: A Little Help needed climbing 3-5 steps with a railing? : A Little 6 Click Score: 20    End of Session   Activity Tolerance: Patient tolerated treatment well;Patient limited by pain Patient left: in chair;with call bell/phone within reach Nurse Communication: Mobility status PT Visit Diagnosis: Other abnormalities of gait and mobility (R26.89);Difficulty in walking,  not elsewhere classified (R26.2);Muscle weakness (generalized) (M62.81);Pain Pain - Right/Left: Right Pain - part of body:  (low back/R leg)    Time: 1350-1404 PT Time Calculation (min) (ACUTE ONLY): 14 min   Charges:   PT Evaluation $PT Eval Moderate Complexity: 1 Mod          Aleda Grana, PT, DPT 03/15/22, 2:17 PM   Sandi Mariscal 03/15/2022, 2:15 PM

## 2022-03-16 DIAGNOSIS — J9621 Acute and chronic respiratory failure with hypoxia: Secondary | ICD-10-CM | POA: Diagnosis not present

## 2022-03-16 DIAGNOSIS — I5032 Chronic diastolic (congestive) heart failure: Secondary | ICD-10-CM | POA: Diagnosis not present

## 2022-03-16 DIAGNOSIS — M545 Low back pain, unspecified: Secondary | ICD-10-CM | POA: Diagnosis not present

## 2022-03-16 DIAGNOSIS — J441 Chronic obstructive pulmonary disease with (acute) exacerbation: Secondary | ICD-10-CM | POA: Diagnosis not present

## 2022-03-16 LAB — GLUCOSE, CAPILLARY
Glucose-Capillary: 188 mg/dL — ABNORMAL HIGH (ref 70–99)
Glucose-Capillary: 205 mg/dL — ABNORMAL HIGH (ref 70–99)
Glucose-Capillary: 330 mg/dL — ABNORMAL HIGH (ref 70–99)
Glucose-Capillary: 242 mg/dL — ABNORMAL HIGH (ref 70–99)

## 2022-03-16 MED ORDER — SODIUM CHLORIDE 0.9 % IV SOLN
1.0000 g | Freq: Every day | INTRAVENOUS | Status: DC
  Administered 2022-03-17: 1 g via INTRAVENOUS
  Filled 2022-03-16: qty 1

## 2022-03-16 NOTE — Progress Notes (Signed)
Progress Note    Cody Alexander  HGD:[id number] DOB: May 05, 1956  DOA: 03/14/2022 PCP: Gildardo Pounds, PA      Brief Narrative:    Medical records reviewed and are as summarized below:  Cody Alexander is a 66 y.o. 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  He said he fell while walking down the steps and landed on his buttocks.  He has had difficulty walking because of the back pain.  He also complained of dysuria, swelling in bilateral legs, shortness of breath and dry cough.  He was admitted to the hospital for acute on chronic low back pain.  MRI lumbar spine showed disc protrusion at L4-L5 which contacts the traversing left L5 nerve root (slightly increased in size since prior study from 08/15/2020). He was also found to have acute on chronic hypoxic and hypercapnic respiratory failure with increased work of breathing.  He was placed on BiPAP initially for acute respiratory failure.  Acute respiratory failure suspected to be from COPD exacerbation.       Assessment/Plan:   Principal Problem:   Acute on chronic respiratory failure (HCC) Active Problems:   COPD exacerbation (HCC)   Chronic diastolic CHF (congestive heart failure) (HCC)   Acute exacerbation of chronic low back pain   Essential hypertension   Leg weakness, bilateral   Fall at home, initial encounter   Obesity (BMI 30-39.9)   OSA (obstructive sleep apnea)   UTI (urinary tract infection)   Diabetes mellitus without complication (HCC)    Body mass index is 40.4 kg/m.  (Morbid obesity)  Acute on chronic hypoxic and hypercapnic respiratory  failure: He is off BiPAP and tolerating 4 L/min oxygen via nasal cannula (at baseline).  ABG showed pH 7.41, PCO2 69 and oxygen saturation of 98.4% on 4 L/min oxygen.  Arrangements are underway for him to have a trilogy machine at home.   Probable COPD exacerbation: Continue bronchodilators.  Discontinue prednisone because of hyperglycemia.  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 Myer Haff, neurosurgeon.  There is no indication for surgery.  Continue analgesics as needed for pain.  Continue PT and OT.   Chronic diastolic CHF, hypertension: Continue diuretics and antihypertensives  Insulin-dependent diabetes mellitus with hyperglycemia: Continue Levemir and NovoLog.  Suspected acute UTI.  He complained of dysuria.  Urine culture is growing gram-negative rods.  Continue IV ceftriaxone.     Diet Order             Diet Carb Modified Fluid consistency: Thin; Room service appropriate? Yes  Diet effective now                            Consultants: Neurosurgeon  Procedures: None    Medications:    amLODipine  10 mg Oral Daily   atorvastatin  40  mg Oral Daily   enoxaparin (LOVENOX) injection  0.5 mg/kg Subcutaneous Q24H   furosemide  80 mg Oral BID   insulin aspart  0-15 Units Subcutaneous TID WC   insulin aspart  5 Units Subcutaneous TID WC   insulin detemir  50 Units Subcutaneous Q2200   isosorbide mononitrate  30 mg Oral Daily   lisinopril  20 mg Oral Daily   metoprolol tartrate  25 mg Oral BID   mometasone-formoterol  2 puff Inhalation BID   neomycin-bacitracin-polymyxin   Topical BID   sodium chloride flush  3 mL Intravenous Q12H    spironolactone  12.5 mg Oral Daily   Continuous Infusions:  sodium chloride 250 mL (03/15/22 0951)   cefTRIAXone (ROCEPHIN)  IV 1 g (03/16/22 0914)     Anti-infectives (From admission, onward)    Start     Dose/Rate Route Frequency Ordered Stop   03/14/22 1000  cefTRIAXone (ROCEPHIN) 1 g in sodium chloride 0.9 % 100 mL IVPB        1 g 200 mL/hr over 30 Minutes Intravenous Every 24 hours 03/14/22 0850 03/17/22 0959              Family Communication/Anticipated D/C date and plan/Code Status   DVT prophylaxis:      Code Status: Full Code  Family Communication: None Disposition Plan: To be determined.  He may need to go to SNF   Status is: Inpatient Remains inpatient appropriate because: COPD exacerbation, acute on chronic back pain, UTI on IV antibiotics       Subjective:   He complains of low back pain.  Breathing is better.  Objective:    Vitals:   03/15/22 2322 03/16/22 0437 03/16/22 0823 03/16/22 0827  BP: 118/62 114/72  127/80  Pulse: 68 (!) 57  (!) 58  Resp: 20 18 16 16   Temp:  97.8 F (36.6 C)  97.6 F (36.4 C)  TempSrc:      SpO2: 97% 94%  97%  Weight:      Height:       No data found.   Intake/Output Summary (Last 24 hours) at 03/16/2022 0933 Last data filed at 03/16/2022 0315 Gross per 24 hour  Intake 1402.19 ml  Output 1850 ml  Net -447.81 ml   Filed Weights   03/14/22 0541 03/14/22 1632  Weight: 131.5 kg (!) 138.9 kg    Exam:   GEN: NAD SKIN: Lump on mid upper back and right mid back.  Right buttock wound from recent fall.  He also has an abrasion on the left buttock. EYES: No pallor or icterus ENT: MMM CV: RRR PULM: Air entry is better.  Mild bilateral wheezing ABD: soft, obese, NT, +BS CNS: AAO x 3, non focal EXT: Mild bilateral leg edema, no tenderness      Data Reviewed:   I have personally reviewed following labs and imaging studies:  Labs: Labs show the following:   Basic Metabolic Panel: Recent Labs   Lab 03/14/22 0551 03/15/22 0446  NA 140 138  K 3.8 4.4  CL 93* 91*  CO2 41* 41*  GLUCOSE 171* 298*  BUN 28* 26*  CREATININE 1.27* 1.22  CALCIUM 9.1 9.1   GFR Estimated Creatinine Clearance: 88.4 mL/min (by C-G formula based on SCr of 1.22 mg/dL). Liver Function Tests: Recent Labs  Lab 03/14/22 0551  AST 17  ALT 18  ALKPHOS 55  BILITOT 0.9  PROT 7.2  ALBUMIN 3.9   No results for input(s): "LIPASE", "AMYLASE" in  the last 168 hours. No results for input(s): "AMMONIA" in the last 168 hours. Coagulation profile No results for input(s): "INR", "PROTIME" in the last 168 hours.  CBC: Recent Labs  Lab 03/14/22 0551 03/15/22 0446  WBC 7.5 11.1*  HGB 13.3 12.7*  HCT 43.8 40.8  MCV 94.0 93.4  PLT 238 260   Cardiac Enzymes: No results for input(s): "CKTOTAL", "CKMB", "CKMBINDEX", "TROPONINI" in the last 168 hours. BNP (last 3 results) No results for input(s): "PROBNP" in the last 8760 hours. CBG: Recent Labs  Lab 03/15/22 0758 03/15/22 1148 03/15/22 1612 03/15/22 2036 03/16/22 0828  GLUCAP 270* 272* 343* 390* 188*   D-Dimer: No results for input(s): "DDIMER" in the last 72 hours. Hgb A1c: No results for input(s): "HGBA1C" in the last 72 hours. Lipid Profile: No results for input(s): "CHOL", "HDL", "LDLCALC", "TRIG", "CHOLHDL", "LDLDIRECT" in the last 72 hours. Thyroid function studies: No results for input(s): "TSH", "T4TOTAL", "T3FREE", "THYROIDAB" in the last 72 hours.  Invalid input(s): "FREET3" Anemia work up: No results for input(s): "VITAMINB12", "FOLATE", "FERRITIN", "TIBC", "IRON", "RETICCTPCT" in the last 72 hours. Sepsis Labs: Recent Labs  Lab 03/14/22 0551 03/15/22 0446  WBC 7.5 11.1*    Microbiology No results found for this or any previous visit (from the past 240 hour(s)).  Procedures and diagnostic studies:  MR LUMBAR SPINE WO CONTRAST  Result Date: 03/14/2022 CLINICAL DATA:  History of degenerative disc disease, low back pain  EXAM: MRI LUMBAR SPINE WITHOUT CONTRAST TECHNIQUE: Multiplanar, multisequence MR imaging of the lumbar spine was performed. No intravenous contrast was administered. COMPARISON:  Lumbar spine MRI 08/15/2020 FINDINGS: Segmentation: Standard; the lowest formed disc space is designated L5-S1. Alignment: There is trace retrolisthesis of L2 on L3 and L3 on L4, unchanged. There is mild levocurvature centered at L2, similar to the prior study. Vertebrae: Vertebral body heights are preserved. Background marrow signal is normal. There is a prominent Schmorl's node indenting the superior T12 endplate which is increased since 2021 but without significant marrow edema. There is mild degenerative endplate marrow signal abnormality at L2-L3 through L5-S1 without edema. There is no suspicious marrow signal abnormality or evidence of acute injury. Conus medullaris and cauda equina: Conus extends to the T12-L1 level. Conus and cauda equina appear normal. Paraspinal and other soft tissues: Unremarkable. Disc levels: There is overall mild multilevel disc desiccation and narrowing, similar to 2021. T12-L1: No significant spinal canal or neural foraminal stenosis. L1-L2: There is a minimal disc bulge without significant spinal canal or neural foraminal stenosis, unchanged. L2-L3: There is a mild disc bulge and mild facet arthropathy with trace effusions without significant spinal canal or neural foraminal stenosis, unchanged. L3-L4: There is a mild disc bulge, degenerative endplate spurring, and mild facet arthropathy with small effusions resulting in mild bilateral neural foraminal stenosis without significant spinal canal stenosis, unchanged. L4-L5: There is a mild disc bulge with superimposed central protrusion, degenerative endplate spurring, and mild facet arthropathy with small effusions resulting in mild narrowing of the left subarticular zone with contact of the traversing left L5 nerve root and mild-to-moderate left and mild  right neural foraminal stenosis. The disc protrusion is slightly increased in size since 2021. L5-S1: No significant spinal canal or neural foraminal stenosis. IMPRESSION: 1. Disc protrusion at L4-L5 which contacts the traversing left L5 nerve root is slightly increased in size since the prior study from 08/15/2020. Mild-to-moderate left and mild right neural foraminal stenosis at this level is not significantly changed. 2. Overall mild degenerative changes  at the other levels as above are overall stable since 2021 without high-grade spinal canal or neural foraminal stenosis or other evidence of nerve root impingement. Electronically Signed   By: Lesia Hausen M.D.   On: 03/14/2022 13:31               LOS: 2 days   Zakeria Kulzer  [facility]   Pager on [email or web address]. If 7PM-7AM, please contact night-coverage at [email or web address]     03/16/2022, 9:33 AM

## 2022-03-16 NOTE — Progress Notes (Signed)
Bipap refused 

## 2022-03-16 NOTE — Plan of Care (Signed)

## 2022-03-16 NOTE — TOC Progression Note (Addendum)
Transition of Care [facility]) - Progression Note    Patient Details  Name: Cody Alexander MRN: [id number] Date of Birth: [birth year]  [facility] CM/SW Contact  Truddie Hidden, RN Phone Number: 03/16/2022, 11:28 AM  Clinical Narrative:    Spoke with patient regarding discharge plan. Patient agreeable to bed off at [facility]. Navi auth started.    Expected Discharge Plan: Skilled Nursing Facility Barriers to Discharge: Continued Medical Work up  Expected Discharge Plan and Services Expected Discharge Plan: Skilled Nursing Facility     Post Acute Care Choice: Skilled Nursing Facility Living arrangements for the past 2 months: Single Family Home                                       Social Determinants of Health (SDOH) Interventions    Readmission Risk Interventions     No data to display

## 2022-03-16 NOTE — Plan of Care (Signed)
°  Problem: Activity: °Goal: Ability to tolerate increased activity will improve °Outcome: Progressing °Goal: Will verbalize the importance of balancing activity with adequate rest periods °Outcome: Progressing °  °

## 2022-03-17 DIAGNOSIS — J441 Chronic obstructive pulmonary disease with (acute) exacerbation: Secondary | ICD-10-CM | POA: Diagnosis not present

## 2022-03-17 DIAGNOSIS — M545 Low back pain, unspecified: Secondary | ICD-10-CM | POA: Diagnosis not present

## 2022-03-17 DIAGNOSIS — J9621 Acute and chronic respiratory failure with hypoxia: Secondary | ICD-10-CM | POA: Diagnosis not present

## 2022-03-17 DIAGNOSIS — I5032 Chronic diastolic (congestive) heart failure: Secondary | ICD-10-CM | POA: Diagnosis not present

## 2022-03-17 LAB — GLUCOSE, CAPILLARY
Glucose-Capillary: 108 mg/dL — ABNORMAL HIGH (ref 70–99)
Glucose-Capillary: 180 mg/dL — ABNORMAL HIGH (ref 70–99)
Glucose-Capillary: 174 mg/dL — ABNORMAL HIGH (ref 70–99)
Glucose-Capillary: 149 mg/dL — ABNORMAL HIGH (ref 70–99)
Glucose-Capillary: 182 mg/dL — ABNORMAL HIGH (ref 70–99)

## 2022-03-17 LAB — URINE CULTURE: Culture: >=100000 — AB

## 2022-03-17 MED ORDER — HYDROCORTISONE 1 % EX CREA
TOPICAL_CREAM | Freq: Two times a day (BID) | CUTANEOUS | Status: DC
Start: 2022-03-17 — End: 2022-03-21
  Filled 2022-03-17 (×2): qty 28

## 2022-03-17 MED ORDER — AMOXICILLIN 500 MG PO CAPS
500.0000 mg | ORAL_CAPSULE | Freq: Three times a day (TID) | ORAL | Status: AC
  Administered 2022-03-18 – 2022-03-20 (×9): 500 mg via ORAL
  Filled 2022-03-17 (×9): qty 1

## 2022-03-17 MED ORDER — OXYCODONE HCL 5 MG PO TABS
5.0000 mg | ORAL_TABLET | Freq: Four times a day (QID) | ORAL | Status: DC | PRN
  Administered 2022-03-17 – 2022-03-20 (×12): 5 mg via ORAL
  Filled 2022-03-17 (×13): qty 1

## 2022-03-17 MED ORDER — ACETAMINOPHEN 325 MG PO TABS
650.0000 mg | ORAL_TABLET | Freq: Four times a day (QID) | ORAL | Status: DC | PRN
  Administered 2022-03-17 – 2022-03-19 (×5): 650 mg via ORAL
  Filled 2022-03-17 (×5): qty 2

## 2022-03-17 NOTE — TOC Progression Note (Signed)
Transition of Care [facility]) - Progression Note    Patient Details  Name: Teal Bontrager MRN: [id number] Date of Birth: November 12, 1955  Transition of [facility]) CM/SW Contact  Chapman Fitch, RN Phone Number: 03/17/2022, 1:59 PM  Clinical Narrative:      Vesta Mixer approved. Milinda Pointer [phone] 03/18/2022-03/20/2022 Expected Discharge Plan: Skilled Nursing Facility Barriers to Discharge: Continued Medical Work up  Expected Discharge Plan and Services Expected Discharge Plan: Skilled Nursing Facility     Post Acute Care Choice: Skilled Nursing Facility Living arrangements for the past 2 months: Single Family Home                                       Social Determinants of Health (SDOH) Interventions    Readmission Risk Interventions     No data to display

## 2022-03-17 NOTE — Progress Notes (Signed)
Per Sobi RRT, patient refused BiPAP tonight.

## 2022-03-17 NOTE — Progress Notes (Addendum)
Progress Note    Cody Alexander  CNO:[id number] DOB: 09/30/1955  DOA: 03/14/2022 PCP: Gildardo Pounds, PA      Brief Narrative:    Medical records reviewed and are as summarized below:  Cody Alexander is a 66 y.o. 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  He said he fell while walking down the steps and landed on his buttocks.  He has had difficulty walking because of the back pain.  He also complained of dysuria, swelling in bilateral legs, shortness of breath and dry cough.  He was admitted to the hospital for acute on chronic low back pain.  MRI lumbar spine showed disc protrusion at L4-L5 which contacts the traversing left L5 nerve root (slightly increased in size since prior study from 08/15/2020). He was also found to have acute on chronic hypoxic and hypercapnic respiratory failure with increased work of breathing.  He was placed on BiPAP initially for acute respiratory failure.  Acute respiratory failure suspected to be from COPD exacerbation.       Assessment/Plan:   Principal Problem:   Acute on chronic respiratory failure (HCC) Active Problems:   COPD exacerbation (HCC)   Chronic diastolic CHF (congestive heart failure) (HCC)   Acute exacerbation of chronic low back pain   Essential hypertension   Leg weakness, bilateral   Fall at home, initial encounter   Obesity (BMI 30-39.9)   OSA (obstructive sleep apnea)   UTI (urinary tract infection)   Diabetes mellitus without complication (HCC)    Body mass index is 40.4 kg/m.  (Morbid obesity)  Acute on chronic hypoxic and hypercapnic respiratory  failure: He is off BiPAP and tolerating 4 L/min oxygen via nasal cannula (at baseline).  ABG showed pH 7.41, PCO2 69 and oxygen saturation of 98.4% on 4 L/min oxygen.  Arrangements are underway for him to have a trilogy machine at home.  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 Myer Haff, neurosurgeon.  There is no indication for surgery.  Continue analgesics as needed for pain.  Continue PT and OT.   Chronic diastolic CHF, hypertension: Continue diuretics and antihypertensives  Insulin-dependent diabetes mellitus with hyperglycemia: Continue Levemir and NovoLog.  Acute UTI.  He complained of dysuria.  Urine culture showed pansensitive E. coli.  Urine culture is growing gram-negative rods.  Switch IV ceftriaxone to amoxicillin    Diet Order             Diet Carb Modified Fluid consistency: Thin; Room service appropriate? Yes  Diet effective now                            Consultants: Neurosurgeon  Procedures: None    Medications:    amLODipine  10  mg Oral Daily   [START ON 03/18/2022] amoxicillin  500 mg Oral Q8H   atorvastatin  40 mg Oral Daily   enoxaparin (LOVENOX) injection  0.5 mg/kg Subcutaneous Q24H   furosemide  80 mg Oral BID   hydrocortisone cream   Topical BID   insulin aspart  0-15 Units Subcutaneous TID WC   insulin aspart  5 Units Subcutaneous TID WC   insulin detemir  50 Units Subcutaneous Q2200   isosorbide mononitrate  30 mg Oral Daily   lisinopril  20 mg Oral Daily   metoprolol tartrate  25 mg Oral BID    mometasone-formoterol  2 puff Inhalation BID   neomycin-bacitracin-polymyxin   Topical BID   sodium chloride flush  3 mL Intravenous Q12H   spironolactone  12.5 mg Oral Daily   Continuous Infusions:  sodium chloride 250 mL (03/15/22 0951)     Anti-infectives (From admission, onward)    Start     Dose/Rate Route Frequency Ordered Stop   03/18/22 0600  amoxicillin (AMOXIL) capsule 500 mg        500 mg Oral Every 8 hours 03/17/22 1200 03/21/22 0559   03/17/22 1000  cefTRIAXone (ROCEPHIN) 1 g in sodium chloride 0.9 % 100 mL IVPB  Status:  Discontinued        1 g 200 mL/hr over 30 Minutes Intravenous Daily 03/16/22 1258 03/17/22 1200   03/14/22 1000  cefTRIAXone (ROCEPHIN) 1 g in sodium chloride 0.9 % 100 mL IVPB        1 g 200 mL/hr over 30 Minutes Intravenous Every 24 hours 03/14/22 0850 03/16/22 0944              Family Communication/Anticipated D/C date and plan/Code Status   DVT prophylaxis:      Code Status: Full Code  Family Communication: None Disposition Plan: Plan to discharge to SNF   Status is: Inpatient Remains inpatient appropriate because: Awaiting placement to SNF      Subjective:   He still reports severe back pain requiring analgesics.  He also complained of an itchy rash on his back.  He said he has had the rash intermittently for about 2 years now.  Objective:    Vitals:   03/17/22 0300 03/17/22 0456 03/17/22 0844 03/17/22 1138  BP:  124/81 123/76 107/68  Pulse:  60 80 63  Resp: 14 19 19 18   Temp:  98.6 F (37 C) 98 F (36.7 C) 97.8 F (36.6 C)  TempSrc:  Oral    SpO2:  98% 94% 94%  Weight:      Height:       No data found.   Intake/Output Summary (Last 24 hours) at 03/17/2022 1200 Last data filed at 03/17/2022 1125 Gross per 24 hour  Intake 580 ml  Output 1500 ml  Net -920 ml   Filed Weights   03/14/22 0541 03/14/22 1632  Weight: 131.5 kg (!) 138.9 kg    Exam:   GEN: NAD SKIN: Erythematous maculopapular rash on  the mid back EYES: No pallor or icterus ENT: MMM CV: RRR PULM: Occasional wheezing ABD: soft, obese, NT, +BS CNS: AAO x 3, non focal EXT: No edema or tenderness        Data Reviewed:   I have personally reviewed following labs and imaging studies:  Labs: Labs show the following:   Basic Metabolic Panel: Recent Labs  Lab 03/14/22 0551 03/15/22 0446  NA 140 138  K 3.8 4.4  CL 93* 91*  CO2 41*  41*  GLUCOSE 171* 298*  BUN 28* 26*  CREATININE 1.27* 1.22  CALCIUM 9.1 9.1   GFR Estimated Creatinine Clearance: 88.4 mL/min (by C-G formula based on SCr of 1.22 mg/dL). Liver Function Tests: Recent Labs  Lab 03/14/22 0551  AST 17  ALT 18  ALKPHOS 55  BILITOT 0.9  PROT 7.2  ALBUMIN 3.9   No results for input(s): "LIPASE", "AMYLASE" in the last 168 hours. No results for input(s): "AMMONIA" in the last 168 hours. Coagulation profile No results for input(s): "INR", "PROTIME" in the last 168 hours.  CBC: Recent Labs  Lab 03/14/22 0551 03/15/22 0446  WBC 7.5 11.1*  HGB 13.3 12.7*  HCT 43.8 40.8  MCV 94.0 93.4  PLT 238 260   Cardiac Enzymes: No results for input(s): "CKTOTAL", "CKMB", "CKMBINDEX", "TROPONINI" in the last 168 hours. BNP (last 3 results) No results for input(s): "PROBNP" in the last 8760 hours. CBG: Recent Labs  Lab 03/16/22 1938 03/16/22 2217 03/17/22 0654 03/17/22 0846 03/17/22 1140  GLUCAP 330* 242* 108* 180* 174*   D-Dimer: No results for input(s): "DDIMER" in the last 72 hours. Hgb A1c: No results for input(s): "HGBA1C" in the last 72 hours. Lipid Profile: No results for input(s): "CHOL", "HDL", "LDLCALC", "TRIG", "CHOLHDL", "LDLDIRECT" in the last 72 hours. Thyroid function studies: No results for input(s): "TSH", "T4TOTAL", "T3FREE", "THYROIDAB" in the last 72 hours.  Invalid input(s): "FREET3" Anemia work up: No results for input(s): "VITAMINB12", "FOLATE", "FERRITIN", "TIBC", "IRON", "RETICCTPCT" in the last 72  hours. Sepsis Labs: Recent Labs  Lab 03/14/22 0551 03/15/22 0446  WBC 7.5 11.1*    Microbiology Recent Results (from the past 240 hour(s))  Urine Culture     Status: Abnormal   Collection Time: 03/14/22  9:00 AM   Specimen: Urine, Clean Catch  Result Value Ref Range Status   Specimen Description   Final    URINE, CLEAN CATCH Performed at [facility], [address], [place], [place] [address]    Special Requests   Final    NONE Performed at [facility], [address] [place], [place] [address]    Culture >=100,000 COLONIES/mL ESCHERICHIA COLI (A)  Final   Report Status 03/17/2022 FINAL  Final   Organism ID, Bacteria ESCHERICHIA COLI (A)  Final      Susceptibility   Escherichia coli - MIC*    AMPICILLIN 4 SENSITIVE Sensitive     CEFAZOLIN <=4 SENSITIVE Sensitive     CEFEPIME <=0.12 SENSITIVE Sensitive     CEFTRIAXONE <=0.25 SENSITIVE Sensitive     CIPROFLOXACIN <=0.25 SENSITIVE Sensitive     GENTAMICIN <=1 SENSITIVE Sensitive     IMIPENEM <=0.25 SENSITIVE Sensitive     NITROFURANTOIN <=16 SENSITIVE Sensitive     TRIMETH/SULFA <=20 SENSITIVE Sensitive     AMPICILLIN/SULBACTAM <=2 SENSITIVE Sensitive     PIP/TAZO <=4 SENSITIVE Sensitive     * >=100,000 COLONIES/mL ESCHERICHIA COLI    Procedures and diagnostic studies:  No results found.             LOS: 3 days   Yoon Barca  [facility]   Pager on [email or web address]. If 7PM-7AM, please contact night-coverage at [email or web address]     03/17/2022, 12:00 PM

## 2022-03-18 DIAGNOSIS — M545 Low back pain, unspecified: Secondary | ICD-10-CM | POA: Diagnosis not present

## 2022-03-18 DIAGNOSIS — J441 Chronic obstructive pulmonary disease with (acute) exacerbation: Secondary | ICD-10-CM | POA: Diagnosis not present

## 2022-03-18 DIAGNOSIS — I5032 Chronic diastolic (congestive) heart failure: Secondary | ICD-10-CM | POA: Diagnosis not present

## 2022-03-18 DIAGNOSIS — J9621 Acute and chronic respiratory failure with hypoxia: Secondary | ICD-10-CM | POA: Diagnosis not present

## 2022-03-18 LAB — GLUCOSE, CAPILLARY
Glucose-Capillary: 111 mg/dL — ABNORMAL HIGH (ref 70–99)
Glucose-Capillary: 172 mg/dL — ABNORMAL HIGH (ref 70–99)
Glucose-Capillary: 215 mg/dL — ABNORMAL HIGH (ref 70–99)
Glucose-Capillary: 197 mg/dL — ABNORMAL HIGH (ref 70–99)

## 2022-03-18 NOTE — Progress Notes (Signed)
Progress Note    Cody Alexander  IOM:[id number] DOB: Apr 09, 1956  DOA: 03/14/2022 PCP: Gildardo Pounds, PA      Brief Narrative:    Medical records reviewed and are as summarized below:  Cody Alexander is a 66 y.o. 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  He said he fell while walking down the steps and landed on his buttocks.  He has had difficulty walking because of the back pain.  He also complained of dysuria, swelling in bilateral legs, shortness of breath and dry cough.  He was admitted to the hospital for acute on chronic low back pain.  MRI lumbar spine showed disc protrusion at L4-L5 which contacts the traversing left L5 nerve root (slightly increased in size since prior study from 08/15/2020). He was also found to have acute on chronic hypoxic and hypercapnic respiratory failure with increased work of breathing.  He was placed on BiPAP initially for acute respiratory failure.  Acute respiratory failure suspected to be from COPD exacerbation.       Assessment/Plan:   Principal Problem:   Acute on chronic respiratory failure (HCC) Active Problems:   COPD exacerbation (HCC)   Chronic diastolic CHF (congestive heart failure) (HCC)   Acute exacerbation of chronic low back pain   Essential hypertension   Leg weakness, bilateral   Fall at home, initial encounter   Obesity (BMI 30-39.9)   OSA (obstructive sleep apnea)   UTI (urinary tract infection)   Diabetes mellitus without complication (HCC)    Body mass index is 40.4 kg/m.  (Morbid obesity)  Acute on chronic hypoxic and hypercapnic respiratory  failure: He is off BiPAP and tolerating 4 L/min oxygen via nasal cannula (at baseline).  ABG showed pH 7.41, PCO2 69 and oxygen saturation of 98.4% on 4 L/min oxygen.  Arrangements are underway for him to have a trilogy machine at home.  Continue 4 L/min oxygen via nasal cannula  Probable COPD exacerbation: Continue bronchodilators.  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 Myer Haff, neurosurgeon.  There is no indication for surgery.  Continue analgesics as needed for pain.  Continue PT and OT.   Chronic diastolic CHF, hypertension: Continue diuretics and antihypertensives  Insulin-dependent diabetes mellitus with hyperglycemia: Continue Levemir and NovoLog.  Acute UTI.  Continue amoxicillin through 03/19/2022    Diet Order             Diet Carb Modified Fluid consistency: Thin; Room service appropriate? Yes  Diet effective now                            Consultants: Neurosurgeon  Procedures: None    Medications:    amLODipine  10 mg Oral Daily   amoxicillin  500 mg Oral Q8H   atorvastatin  40 mg Oral Daily  enoxaparin (LOVENOX) injection  0.5 mg/kg Subcutaneous Q24H   furosemide  80 mg Oral BID   hydrocortisone cream   Topical BID   insulin aspart  0-15 Units Subcutaneous TID WC   insulin aspart  5 Units Subcutaneous TID WC   insulin detemir  50 Units Subcutaneous Q2200   isosorbide mononitrate  30 mg Oral Daily   lisinopril  20 mg Oral Daily   metoprolol tartrate  25 mg Oral BID   mometasone-formoterol  2 puff Inhalation BID   sodium chloride flush  3 mL Intravenous Q12H   spironolactone  12.5 mg Oral Daily   Continuous  Infusions:  sodium chloride 250 mL (03/15/22 0951)     Anti-infectives (From admission, onward)    Start     Dose/Rate Route Frequency Ordered Stop   03/18/22 0600  amoxicillin (AMOXIL) capsule 500 mg        500 mg Oral Every 8 hours 03/17/22 1200 03/21/22 0559   03/17/22 1000  cefTRIAXone (ROCEPHIN) 1 g in sodium chloride 0.9 % 100 mL IVPB  Status:  Discontinued        1 g 200 mL/hr over 30 Minutes Intravenous Daily 03/16/22 1258 03/17/22 1200   03/14/22 1000  cefTRIAXone (ROCEPHIN) 1 g in sodium chloride 0.9 % 100 mL IVPB        1 g 200 mL/hr over 30 Minutes Intravenous Every 24 hours 03/14/22 0850 03/16/22 0944              Family Communication/Anticipated D/C date and plan/Code Status   DVT prophylaxis:      Code Status: Full Code  Family Communication: None Disposition Plan: Plan to discharge to SNF   Status is: Inpatient Remains inpatient appropriate because: Awaiting placement to SNF      Subjective:   He complains of low back pain.  He said the "the pain medicine helps".  Objective:    Vitals:   03/18/22 0500 03/18/22 0800 03/18/22 1220 03/18/22 1525  BP:  (!) 135/94 114/82 120/70  Pulse:  81 74 72  Resp: 20 20 20 20   Temp:  97.8 F (36.6 C) 97.9 F (36.6 C) 98.4 F (36.9 C)  TempSrc:      SpO2:  97% 95% 94%  Weight:      Height:       No data found.   Intake/Output Summary (Last 24 hours) at 03/18/2022 1527 Last data filed at 03/18/2022 1139 Gross per 24 hour  Intake 600 ml  Output 1350 ml  Net -750 ml   Filed Weights   03/14/22 0541 03/14/22 1632  Weight: 131.5 kg (!) 138.9 kg    Exam:  GEN: NAD SKIN: Erythematous maculopapular rash on the mid back, lipoma on the upper mid back and right mid back EYES: No pallor or icterus ENT: MMM CV: RRR PULM: CTA B ABD: soft, obese, NT, +BS CNS: AAO x 3, non focal EXT: No edema or tenderness          Data Reviewed:   I have personally reviewed following labs and imaging  studies:  Labs: Labs show the following:   Basic Metabolic Panel: Recent Labs  Lab 03/14/22 0551 03/15/22 0446  NA 140 138  K 3.8 4.4  CL 93* 91*  CO2 41* 41*  GLUCOSE 171* 298*  BUN 28* 26*  CREATININE 1.27* 1.22  CALCIUM 9.1 9.1   GFR Estimated Creatinine Clearance: 88.4 mL/min (by C-G formula based on SCr of 1.22 mg/dL). Liver Function Tests: Recent  Labs  Lab 03/14/22 0551  AST 17  ALT 18  ALKPHOS 55  BILITOT 0.9  PROT 7.2  ALBUMIN 3.9   No results for input(s): "LIPASE", "AMYLASE" in the last 168 hours. No results for input(s): "AMMONIA" in the last 168 hours. Coagulation profile No results for input(s): "INR", "PROTIME" in the last 168 hours.  CBC: Recent Labs  Lab 03/14/22 0551 03/15/22 0446  WBC 7.5 11.1*  HGB 13.3 12.7*  HCT 43.8 40.8  MCV 94.0 93.4  PLT 238 260   Cardiac Enzymes: No results for input(s): "CKTOTAL", "CKMB", "CKMBINDEX", "TROPONINI" in the last 168 hours. BNP (last 3 results) No results for input(s): "PROBNP" in the last 8760 hours. CBG: Recent Labs  Lab 03/17/22 1140 03/17/22 1636 03/17/22 2126 03/18/22 0822 03/18/22 1136  GLUCAP 174* 149* 182* 111* 172*   D-Dimer: No results for input(s): "DDIMER" in the last 72 hours. Hgb A1c: No results for input(s): "HGBA1C" in the last 72 hours. Lipid Profile: No results for input(s): "CHOL", "HDL", "LDLCALC", "TRIG", "CHOLHDL", "LDLDIRECT" in the last 72 hours. Thyroid function studies: No results for input(s): "TSH", "T4TOTAL", "T3FREE", "THYROIDAB" in the last 72 hours.  Invalid input(s): "FREET3" Anemia work up: No results for input(s): "VITAMINB12", "FOLATE", "FERRITIN", "TIBC", "IRON", "RETICCTPCT" in the last 72 hours. Sepsis Labs: Recent Labs  Lab 03/14/22 0551 03/15/22 0446  WBC 7.5 11.1*    Microbiology Recent Results (from the past 240 hour(s))  Urine Culture     Status: Abnormal   Collection Time: 03/14/22  9:00 AM   Specimen: Urine, Clean Catch  Result  Value Ref Range Status   Specimen Description   Final    URINE, CLEAN CATCH Performed at [facility], [address], [place], [place] [address]    Special Requests   Final    NONE Performed at [facility], [address] [place], [place] [address]    Culture >=100,000 COLONIES/mL ESCHERICHIA COLI (A)  Final   Report Status 03/17/2022 FINAL  Final   Organism ID, Bacteria ESCHERICHIA COLI (A)  Final      Susceptibility   Escherichia coli - MIC*    AMPICILLIN 4 SENSITIVE Sensitive     CEFAZOLIN <=4 SENSITIVE Sensitive     CEFEPIME <=0.12 SENSITIVE Sensitive     CEFTRIAXONE <=0.25 SENSITIVE Sensitive     CIPROFLOXACIN <=0.25 SENSITIVE Sensitive     GENTAMICIN <=1 SENSITIVE Sensitive     IMIPENEM <=0.25 SENSITIVE Sensitive     NITROFURANTOIN <=16 SENSITIVE Sensitive     TRIMETH/SULFA <=20 SENSITIVE Sensitive     AMPICILLIN/SULBACTAM <=2 SENSITIVE Sensitive     PIP/TAZO <=4 SENSITIVE Sensitive     * >=100,000 COLONIES/mL ESCHERICHIA COLI    Procedures and diagnostic studies:  No results found.             LOS: 4 days   Inez Rosato  [facility]   Pager on [email or web address]. If 7PM-7AM, please contact night-coverage at [email or web address]     03/18/2022, 3:27 PM

## 2022-03-18 NOTE — TOC Progression Note (Signed)
Transition of Care [facility]) - Progression Note    Patient Details  Name: Cody Alexander MRN: [id number] Date of Birth: [birth year]  Transition of Care [facility]) CM/SW Contact  Chapman Fitch, RN Phone Number: 03/18/2022, 3:03 PM  Clinical Narrative:     Per MD patient medically ready for discharge Per Tanya at [facility] they have to order BIPAP, Bariatric bed, and o2 concentrator.  Earliest patient could be admitted is tomorrow.   Patient also requesting pain medication at discharge.  MD notified   Expected Discharge Plan: Skilled Nursing Facility Barriers to Discharge: Continued Medical Work up  Expected Discharge Plan and Services Expected Discharge Plan: Skilled Nursing Facility     Post Acute Care Choice: Skilled Nursing Facility Living arrangements for the past 2 months: Single Family Home                                       Social Determinants of Health (SDOH) Interventions    Readmission Risk Interventions     No data to display

## 2022-03-18 NOTE — Progress Notes (Signed)
Occupational Therapy Treatment Patient Details Name: Cody Alexander MRN: [id number] DOB: Jan 15, 1956 Today's Date: 03/18/2022   History of present illness Pt is a 66 y/o M admitted on 03/14/22 after presenting to the ED with c/o worsening back pain over the last 5 days. Pt reports he fell down the stairs & landed on his R buttock & has had worsening LBP, BLE swelling, & inability to walk. Pt endorses multiple falls over the last 5 days. Pt is being treated for acute on chronic respiratory failure & acute exacerbation of LBP. MRI with results of disc protrusion.  PMH: COPD with chronic respiratory failure on 4L O2, IDDM, HTN, chronic diastolic dysfunction CHF, chronic LBP, neuropathy   OT comments  Pt declined OOB activities d/t high LBP levels this date.  Nursing stated pt is not due for pain meds until 530p, but pt was agreeable to education/instruction in use of LB AE aids, including long handled sponge, reacher, sockaid, and long handled shoe horn.  OT demonstrated donning/doffing sock/shoe, shorts.  OT advised on various options to obtain equipment.  Pt is a Games developer by trade and stated he plans to make a sockaid, and has seen a reacher and long handled sponge at a local thrift store and plans to obtain.  Pt found demo helpful but declined to attempt AE himself d/t pain levels, but pt verbalized understanding and was appreciative of education and reports these items will be helpful as pt has struggled for "awhile" now to manage his LB ADLs d/t pain and limited reach.  Will continue to follow in the acute setting to work towards OT goals/poc.  Pt hoping to d/c to SNF.        Recommendations for follow up therapy are one component of a multi-disciplinary discharge planning process, led by the attending physician.  Recommendations may be updated based on patient status, additional functional criteria and insurance authorization.    Follow Up Recommendations  Skilled nursing-short term rehab (<3  hours/day)    Assistance Recommended at Discharge PRN  Patient can return home with the following  A little help with walking and/or transfers;A little help with bathing/dressing/bathroom;Help with stairs or ramp for entrance   Equipment Recommendations  Other (comment) (hip kit; though pt states he will likely make his own sockaid and hunt for other items at thrift stores)    Recommendations for Other Services      Precautions / Restrictions Precautions Precautions: Fall Precaution Comments: back precautions for comfort Restrictions Weight Bearing Restrictions: No       Mobility Bed Mobility               General bed mobility comments: declined all but agreeable to OT demo of LB ADL AE. Patient Response: Cooperative  Transfers                         Balance       Sitting balance - Comments: NT this date       Standing balance comment: NT this date                           ADL either performed or assessed with clinical judgement   ADL                                         General  ADL Comments: Pt declined OOB activities this date d/t LBP but was agreeable to LB dressing training with use of long handled devices with OT providing demo.  See narrative for details.    Extremity/Trunk Assessment Upper Extremity Assessment Upper Extremity Assessment: Overall WFL for tasks assessed   Lower Extremity Assessment Lower Extremity Assessment: Defer to PT evaluation        Vision Patient Visual Report: No change from baseline                Cognition Arousal/Alertness: Awake/alert   Overall Cognitive Status: Within Functional Limits for tasks assessed                                          Exercises Other Exercises Other Exercises: instruction in LB AE (hip kit items)    Shoulder Instructions       General Comments Pt declined OOB activities d/t back pain, but was receptive to LB ADL  AE training as a compensatory strategy to minimize back pain during LB ADLs as pt states he's struggled with these activities for awhile now, even before his falls.    Pertinent Vitals/ Pain       Pain Assessment Pain Score: 9  Pain Location: low back, RLE Pain Descriptors / Indicators: Grimacing, Discomfort Pain Intervention(s): Limited activity within patient's tolerance                                                          Frequency  Min 2X/week        Progress Toward Goals  OT Goals(current goals can now be found in the care plan section)  Progress towards OT goals: OT to reassess next treatment  Acute Rehab OT Goals Patient Stated Goal: "I want to go to one of those assisted living places." OT Goal Formulation: With patient Time For Goal Achievement: 03/29/22 Potential to Achieve Goals: [place] Discharge plan needs to be updated                     AM-PAC OT "6 Clicks" Daily Activity     Outcome Measure   Help from another person eating meals?: None Help from another person taking care of personal grooming?: None Help from another person toileting, which includes using toliet, bedpan, or urinal?: A Little Help from another person bathing (including washing, rinsing, drying)?: A Little Help from another person to put on and taking off regular upper body clothing?: None Help from another person to put on and taking off regular lower body clothing?: A Little 6 Click Score: 21    End of Session Equipment Utilized During Treatment: Oxygen  OT Visit Diagnosis: Unsteadiness on feet (R26.81);Repeated falls (R29.6);Muscle weakness (generalized) (M62.81);History of falling (Z91.81)   Activity Tolerance Patient limited by pain   Patient Left in bed;with call bell/phone within reach   Nurse Communication Other (comment) (Notified nursing of pt declining OOB actvities d/t pain but was receptive to LB dressing AE training.  Nursing  stated pt was due for pain meds at 530p and she was aware of pain level.)        Time: 5427-0623 OT Time Calculation (min): 16 min  Charges: OT  General Charges $OT Visit: 1 Visit OT Treatments $Self Care/Home Management : 8-22 mins  Leta Speller, MS, OTR/L   Darleene Cleaver 03/18/2022, 4:58 PM

## 2022-03-18 NOTE — Progress Notes (Signed)
Physical Therapy Treatment Patient Details Name: Cody Alexander MRN: [id number] DOB: 1956/02/28 Today's Date: 03/18/2022   History of Present Illness Pt is a 66 y/o M admitted on 03/14/22 after presenting to the ED with c/o worsening back pain over the last 5 days. Pt reports he fell down the stairs & landed on his R buttock & has had worsening LBP, BLE swelling, & inability to walk. Pt endorses multiple falls over the last 5 days. Pt is being treated for acute on chronic respiratory failure & acute exacerbation of LBP. MRI with results of disc protrusion.  PMH: COPD with chronic respiratory failure on 4L O2, IDDM, HTN, chronic diastolic dysfunction CHF, chronic LBP, neuropathy    PT Comments    Pt declined all but below supine therex this session secondary to increased RLE and low back pain this date. Pt education provided on physiological benefits of activity but pt continued to decline.  Pt put forth fair effort with below therex with no reported exacerbation of pain. Pt will benefit from PT services in a SNF setting upon discharge to safely address deficits listed in patient problem list for decreased caregiver assistance and eventual return to PLOF.   Recommendations for follow up therapy are one component of a multi-disciplinary discharge planning process, led by the attending physician.  Recommendations may be updated based on patient status, additional functional criteria and insurance authorization.  Follow Up Recommendations  Skilled nursing-short term rehab (<3 hours/day) Can patient physically be transported by private vehicle: Yes   Assistance Recommended at Discharge Intermittent Supervision/Assistance  Patient can return home with the following A little help with walking and/or transfers;A little help with bathing/dressing/bathroom;Assistance with cooking/housework;Assist for transportation;Direct supervision/assist for medications management;Help with stairs or ramp for  entrance   Equipment Recommendations  None recommended by PT    Recommendations for Other Services       Precautions / Restrictions Precautions Precautions: Fall Precaution Comments: back precautions for comfort Restrictions Weight Bearing Restrictions: No     Mobility  Bed Mobility               General bed mobility comments: Declined all but supine therex per below secondary to back pain    Transfers                        Ambulation/Gait                   Stairs             Wheelchair Mobility    Modified Rankin (Stroke Patients Only)       Balance                                            Cognition Arousal/Alertness: Awake/alert Behavior During Therapy: WFL for tasks assessed/performed Overall Cognitive Status: Within Functional Limits for tasks assessed                                          Exercises Total Joint Exercises Ankle Circles/Pumps: Strengthening, Both, 5 reps, 10 reps (with manual resistance) Quad Sets: Strengthening, Both, 5 reps, 10 reps Gluteal Sets: Strengthening, Both, 5 reps, 10 reps Short Arc Quad: Strengthening, Both, 10 reps Hip ABduction/ADduction: Strengthening, Left, 10 reps (  with manual resistance) Straight Leg Raises: Strengthening, Left, 10 reps Other Exercises Other Exercises: HEP education for BLE APs, QS, and GS and LLE hip abd/add, SLR    General Comments        Pertinent Vitals/Pain Pain Assessment Pain Assessment: 0-10 Pain Score: 9  Pain Location: low back, RLE Pain Descriptors / Indicators: Grimacing, Discomfort, Guarding Pain Intervention(s): Premedicated before session, Monitored during session    Home Living                          Prior Function            PT Goals (current goals can now be found in the care plan section) Progress towards PT goals: PT to reassess next treatment    Frequency    Min  2X/week      PT Plan Current plan remains appropriate    Co-evaluation              AM-PAC PT "6 Clicks" Mobility   Outcome Measure  Help needed turning from your back to your side while in a flat bed without using bedrails?: None Help needed moving from lying on your back to sitting on the side of a flat bed without using bedrails?: None Help needed moving to and from a bed to a chair (including a wheelchair)?: A Little Help needed standing up from a chair using your arms (e.g., wheelchair or bedside chair)?: A Little Help needed to walk in hospital room?: A Little Help needed climbing 3-5 steps with a railing? : A Little 6 Click Score: 20    End of Session   Activity Tolerance: Patient limited by pain Patient left: in bed;with call bell/phone within reach;with bed alarm set Nurse Communication: Mobility status PT Visit Diagnosis: Other abnormalities of gait and mobility (R26.89);Difficulty in walking, not elsewhere classified (R26.2);Muscle weakness (generalized) (M62.81);Pain Pain - Right/Left: Right Pain - part of body: Leg (and low back)     Time: 7342-8768 PT Time Calculation (min) (ACUTE ONLY): 20 min  Charges:  $Therapeutic Exercise: 8-22 mins                     D. Elly Modena PT, DPT 03/18/22, 3:51 PM

## 2022-03-18 NOTE — Care Management Important Message (Signed)
Important Message  Patient Details  Name: Cody Alexander MRN: [id number] Date of Birth: Nov 04, 1955   Medicare Important Message Given:  Yes     [name] 03/18/2022, 11:43 AM

## 2022-03-19 DIAGNOSIS — J441 Chronic obstructive pulmonary disease with (acute) exacerbation: Secondary | ICD-10-CM | POA: Diagnosis not present

## 2022-03-19 DIAGNOSIS — J9621 Acute and chronic respiratory failure with hypoxia: Secondary | ICD-10-CM | POA: Diagnosis not present

## 2022-03-19 DIAGNOSIS — M545 Low back pain, unspecified: Secondary | ICD-10-CM | POA: Diagnosis not present

## 2022-03-19 DIAGNOSIS — I5032 Chronic diastolic (congestive) heart failure: Secondary | ICD-10-CM | POA: Diagnosis not present

## 2022-03-19 LAB — GLUCOSE, CAPILLARY
Glucose-Capillary: 160 mg/dL — ABNORMAL HIGH (ref 70–99)
Glucose-Capillary: 207 mg/dL — ABNORMAL HIGH (ref 70–99)
Glucose-Capillary: 212 mg/dL — ABNORMAL HIGH (ref 70–99)
Glucose-Capillary: 164 mg/dL — ABNORMAL HIGH (ref 70–99)

## 2022-03-19 MED ORDER — KETOROLAC TROMETHAMINE 15 MG/ML IJ SOLN
15.0000 mg | Freq: Four times a day (QID) | INTRAMUSCULAR | Status: AC | PRN
  Administered 2022-03-19: 15 mg via INTRAVENOUS
  Filled 2022-03-19: qty 1

## 2022-03-19 NOTE — Progress Notes (Addendum)
Progress Note    [name]  EXH:[id number] DOB: [birth year]  DOA: 03/14/2022 PCP: Gildardo Pounds, PA      Brief Narrative:    Medical records reviewed and are as summarized below:  Cody Alexander is a 66 y.o. 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  He said he fell while walking down the steps and landed on his buttocks.  He has had difficulty walking because of the back pain.  He also complained of dysuria, swelling in bilateral legs, shortness of breath and dry cough.  He was admitted to the hospital for acute on chronic low back pain.  MRI lumbar spine showed disc protrusion at L4-L5 which contacts the traversing left L5 nerve root (slightly increased in size since prior study from 08/15/2020). He was also found to have acute on chronic hypoxic and hypercapnic respiratory failure with increased work of breathing.  He was placed on BiPAP initially for acute respiratory failure.  Acute respiratory failure suspected to be from COPD exacerbation.       Assessment/Plan:   Principal Problem:   Acute on chronic respiratory failure (HCC) Active Problems:   COPD exacerbation (HCC)   Chronic diastolic CHF (congestive heart failure) (HCC)   Acute exacerbation of chronic low back pain   Essential hypertension   Leg weakness, bilateral   Fall at home, initial encounter   Obesity (BMI 30-39.9)   OSA (obstructive sleep apnea)   UTI (urinary tract infection)   Diabetes mellitus without complication (HCC)    Body mass index is 40.4 kg/m.  (Morbid obesity)  Acute on chronic hypoxic and hypercapnic respiratory  failure: He is off BiPAP and tolerating 4 L/min oxygen via nasal cannula (at baseline).  ABG showed pH 7.41, PCO2 69 and oxygen saturation of 98.4% on 4 L/min oxygen.  He will be discharged to SNF with BiPAP.  Arrangements are underway for him to have a trilogy machine at home.  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 Myer Haff, neurosurgeon.  There is no indication for surgery.  Continue analgesics as needed for pain.  Continue PT and OT while inpatient Recommended that he go to the pain clinic for management of chronic low back pain. I discouraged the use of increasing doses of opioids because of lung disease and chronic respiratory failure.  IV Toradol as needed for moderate to severe pain.  Chronic diastolic CHF, hypertension: Continue diuretics and antihypertensives  Insulin-dependent diabetes mellitus with hyperglycemia: Continue Levemir and NovoLog.  Acute UTI.  Continue amoxicillin through 03/19/2022    Diet Order             Diet Carb Modified Fluid consistency: Thin; Room service appropriate? Yes  Diet effective now  Consultants: Neurosurgeon  Procedures: None    Medications:    amLODipine  10 mg Oral Daily   amoxicillin  500 mg Oral Q8H   atorvastatin  40 mg Oral Daily   enoxaparin (LOVENOX) injection  0.5 mg/kg Subcutaneous Q24H   furosemide  80 mg Oral BID   hydrocortisone cream   Topical BID   insulin aspart  0-15 Units Subcutaneous TID WC   insulin aspart  5 Units Subcutaneous TID WC   insulin detemir  50 Units  Subcutaneous Q2200   isosorbide mononitrate  30 mg Oral Daily   lisinopril  20 mg Oral Daily   metoprolol tartrate  25 mg Oral BID   mometasone-formoterol  2 puff Inhalation BID   sodium chloride flush  3 mL Intravenous Q12H   spironolactone  12.5 mg Oral Daily   Continuous Infusions:  sodium chloride 250 mL (03/15/22 0951)     Anti-infectives (From admission, onward)    Start     Dose/Rate Route Frequency Ordered Stop   03/18/22 0600  amoxicillin (AMOXIL) capsule 500 mg        500 mg Oral Every 8 hours 03/17/22 1200 03/21/22 0559   03/17/22 1000  cefTRIAXone (ROCEPHIN) 1 g in sodium chloride 0.9 % 100 mL IVPB  Status:  Discontinued        1 g 200 mL/hr over 30 Minutes Intravenous Daily 03/16/22 1258 03/17/22 1200   03/14/22 1000  cefTRIAXone (ROCEPHIN) 1 g in sodium chloride 0.9 % 100 mL IVPB        1 g 200 mL/hr over 30 Minutes Intravenous Every 24 hours 03/14/22 0850 03/16/22 0944              Family Communication/Anticipated D/C date and plan/Code Status   DVT prophylaxis:      Code Status: Full Code  Family Communication: None Disposition Plan: Plan to discharge to SNF tomorrow   Status is: Inpatient Remains inpatient appropriate because: Awaiting placement to SNF      Subjective:   He still complains of low back pain.  No shortness of breath or chest pain.  He requested more frequent dosing of oxycodone.  Objective:    Vitals:   03/19/22 0400 03/19/22 0802 03/19/22 1131 03/19/22 1652  BP:  109/75 107/79 109/86  Pulse:  63 81 73  Resp: 14 17 18 18   Temp:  97.8 F (36.6 C) 97.8 F (36.6 C) 97.7 F (36.5 C)  TempSrc:      SpO2:  97% 96% 97%  Weight:      Height:       No data found.   Intake/Output Summary (Last 24 hours) at 03/19/2022 1724 Last data filed at 03/19/2022 1652 Gross per 24 hour  Intake 960 ml  Output 2300 ml  Net -1340 ml   Filed Weights   03/14/22 0541 03/14/22 1632  Weight: 131.5 kg (!) 138.9 kg     Exam:  GEN: NAD SKIN: Warm and dry.  Erythematous maculopapular rash on the mid back, bipolar upper back and right mid back. EYES: No pallor or icterus ENT: MMM CV: RRR PULM: Wheezing has improved ABD: soft, obese, NT, +BS CNS: AAO x 3, non focal EXT: No edema or tenderness           Data Reviewed:   I have personally reviewed following labs and imaging studies:  Labs: Labs show the following:   Basic Metabolic Panel: Recent Labs  Lab 03/14/22 0551 03/15/22 0446  NA 140 138  K 3.8 4.4  CL 93* 91*  CO2 41* 41*  GLUCOSE 171* 298*  BUN 28* 26*  CREATININE 1.27* 1.22  CALCIUM 9.1 9.1   GFR Estimated Creatinine Clearance: 88.4 mL/min (by C-G formula based on SCr of 1.22 mg/dL). Liver Function Tests: Recent Labs  Lab 03/14/22 0551  AST 17  ALT 18  ALKPHOS 55  BILITOT 0.9  PROT 7.2  ALBUMIN 3.9   No results for input(s): "LIPASE", "AMYLASE" in the last 168 hours. No results for input(s): "AMMONIA" in the last 168 hours. Coagulation profile No results for input(s): "INR", "PROTIME" in the last 168 hours.  CBC: Recent Labs  Lab 03/14/22 0551 03/15/22 0446  WBC 7.5 11.1*  HGB 13.3 12.7*  HCT 43.8 40.8  MCV 94.0 93.4  PLT 238 260   Cardiac Enzymes: No results for input(s): "CKTOTAL", "CKMB", "CKMBINDEX", "TROPONINI" in the last 168 hours. BNP (last 3 results) No results for input(s): "PROBNP" in the last 8760 hours. CBG: Recent Labs  Lab 03/18/22 1527 03/18/22 2105 03/19/22 0802 03/19/22 1126 03/19/22 1652  GLUCAP 215* 197* 160* 207* 212*   D-Dimer: No results for input(s): "DDIMER" in the last 72 hours. Hgb A1c: No results for input(s): "HGBA1C" in the last 72 hours. Lipid Profile: No results for input(s): "CHOL", "HDL", "LDLCALC", "TRIG", "CHOLHDL", "LDLDIRECT" in the last 72 hours. Thyroid function studies: No results for input(s): "TSH", "T4TOTAL", "T3FREE", "THYROIDAB" in the last 72 hours.  Invalid input(s):  "FREET3" Anemia work up: No results for input(s): "VITAMINB12", "FOLATE", "FERRITIN", "TIBC", "IRON", "RETICCTPCT" in the last 72 hours. Sepsis Labs: Recent Labs  Lab 03/14/22 0551 03/15/22 0446  WBC 7.5 11.1*    Microbiology Recent Results (from the past 240 hour(s))  Urine Culture     Status: Abnormal   Collection Time: 03/14/22  9:00 AM   Specimen: Urine, Clean Catch  Result Value Ref Range Status   Specimen Description   Final    URINE, CLEAN CATCH Performed at [facility], [address], [place], [place] [address]    Special Requests   Final    NONE Performed at [facility], [address] [place], [place] [address]    Culture >=100,000 COLONIES/mL ESCHERICHIA COLI (A)  Final   Report Status 03/17/2022 FINAL  Final   Organism ID, Bacteria ESCHERICHIA COLI (A)  Final      Susceptibility   Escherichia coli - MIC*    AMPICILLIN 4 SENSITIVE Sensitive     CEFAZOLIN <=4 SENSITIVE Sensitive     CEFEPIME <=0.12 SENSITIVE Sensitive     CEFTRIAXONE <=0.25 SENSITIVE Sensitive     CIPROFLOXACIN <=0.25 SENSITIVE Sensitive     GENTAMICIN <=1 SENSITIVE Sensitive     IMIPENEM <=0.25 SENSITIVE Sensitive     NITROFURANTOIN <=16 SENSITIVE Sensitive     TRIMETH/SULFA <=20 SENSITIVE Sensitive     AMPICILLIN/SULBACTAM <=2 SENSITIVE Sensitive     PIP/TAZO <=4 SENSITIVE Sensitive     * >=100,000 COLONIES/mL ESCHERICHIA COLI    Procedures and diagnostic studies:  No results found.             LOS: 5 days   Christyann Manolis  [facility]   Pager on [email or web address]. If 7PM-7AM, please contact night-coverage at [email or web address]     03/19/2022, 5:24 PM

## 2022-03-19 NOTE — TOC Progression Note (Signed)
Transition of Care [facility]) - Progression Note    Patient Details  Name: Cody Alexander MRN: [id number] Date of Birth: [birth year]  Transition of [facility]) CM/SW Contact  Margarito Liner, LCSW Phone Number: 03/19/2022, 11:28 AM  Clinical Narrative:   Equipment has not been delivered to the SNF yet.  Expected Discharge Plan: Skilled Nursing Facility Barriers to Discharge: Continued Medical Work up  Expected Discharge Plan and Services Expected Discharge Plan: Skilled Nursing Facility     Post Acute Care Choice: Skilled Nursing Facility Living arrangements for the past 2 months: Single Family Home                                       Social Determinants of Health (SDOH) Interventions    Readmission Risk Interventions     No data to display

## 2022-03-19 NOTE — Progress Notes (Signed)
Occupational Therapy Treatment Patient Details Name: Cody Alexander MRN: [id number] DOB: 03-May-1956 Today's Date: 03/19/2022   History of present illness Pt is a 66 y/o M admitted on 03/14/22 after presenting to the ED with c/o worsening back pain over the last 5 days. Pt reports he fell down the stairs & landed on his R buttock & has had worsening LBP, BLE swelling, & inability to walk. Pt endorses multiple falls over the last 5 days. Pt is being treated for acute on chronic respiratory failure & acute exacerbation of LBP. MRI with results of disc protrusion.  PMH: COPD with chronic respiratory failure on 4L O2, IDDM, HTN, chronic diastolic dysfunction CHF, chronic LBP, neuropathy   OT comments  Upon entering the room, pt supine in bed but agreeable to OT intervention. RN present in room to give pain medications via IV. Pt performs bed mobility independently without physical assistance. Pt stands with supervision and ambulates 20' in room with therapist managing O2 line and pt utilizing RW. Pt does appear to have increased R foot drag with ambulation but no LOB during session. Pt requesting to just remain seated on edge of bed at end of session and he is able to return to supine on his own. Pt reporting pain of 8.5/10 in lower back after functional ambulation.    Recommendations for follow up therapy are one component of a multi-disciplinary discharge planning process, led by the attending physician.  Recommendations may be updated based on patient status, additional functional criteria and insurance authorization.    Follow Up Recommendations  Skilled nursing-short term rehab (<3 hours/day)    Assistance Recommended at Discharge PRN  Patient can return home with the following  A little help with walking and/or transfers;A little help with bathing/dressing/bathroom;Help with stairs or ramp for entrance   Equipment Recommendations  Other (comment) (defer to next venue of care)        Precautions / Restrictions Precautions Precautions: Fall Precaution Comments: back precautions for comfort Restrictions Weight Bearing Restrictions: No       Mobility Bed Mobility Overal bed mobility: Modified Independent                  Transfers Overall transfer level: Needs assistance Equipment used: None Transfers: Sit to/from Stand Sit to Stand: Supervision                 Balance Overall balance assessment: Needs assistance Sitting-balance support: Feet supported Sitting balance-Leahy Scale: Good     Standing balance support: During functional activity, Single extremity supported, Reliant on assistive device for balance Standing balance-Leahy Scale: Fair                             ADL either performed or assessed with clinical judgement    Extremity/Trunk Assessment Upper Extremity Assessment Upper Extremity Assessment: Overall WFL for tasks assessed   Lower Extremity Assessment Lower Extremity Assessment: Defer to PT evaluation        Vision Patient Visual Report: No change from baseline            Cognition Arousal/Alertness: Awake/alert Behavior During Therapy: WFL for tasks assessed/performed Overall Cognitive Status: Within Functional Limits for tasks assessed  Pertinent Vitals/ Pain       Pain Assessment Pain Assessment: 0-10 Pain Score: 8  Pain Location: low back Pain Descriptors / Indicators: Grimacing, Discomfort Pain Intervention(s): Monitored during session, Premedicated before session, Repositioned         Frequency  Min 2X/week        Progress Toward Goals  OT Goals(current goals can now be found in the care plan section)  Progress towards OT goals: Progressing toward goals  Acute Rehab OT Goals Patient Stated Goal: " I want to go to one of those assisted living places" OT Goal Formulation: With patient Time For Goal  Achievement: 03/29/22 Potential to Achieve Goals: Fair  Plan Discharge plan remains appropriate;Frequency remains appropriate       AM-PAC OT "6 Clicks" Daily Activity     Outcome Measure   Help from another person eating meals?: None Help from another person taking care of personal grooming?: None Help from another person toileting, which includes using toliet, bedpan, or urinal?: A Little Help from another person bathing (including washing, rinsing, drying)?: A Little Help from another person to put on and taking off regular upper body clothing?: None Help from another person to put on and taking off regular lower body clothing?: A Little 6 Click Score: 21    End of Session Equipment Utilized During Treatment: Oxygen  OT Visit Diagnosis: Unsteadiness on feet (R26.81);Repeated falls (R29.6);Muscle weakness (generalized) (M62.81);History of falling (Z91.81)   Activity Tolerance Patient limited by pain;Patient tolerated treatment well   Patient Left in bed;with call bell/phone within reach   Nurse Communication Mobility status        Time: 6962-9528 OT Time Calculation (min): 26 min  Charges: OT General Charges $OT Visit: 1 Visit OT Evaluation $OT Eval Moderate Complexity: 1 Mod OT Treatments $Therapeutic Activity: 8-22 mins  Jackquline Denmark, MS, OTR/L , CBIS ascom [phone]  03/19/22, 3:35 PM

## 2022-03-20 DIAGNOSIS — J441 Chronic obstructive pulmonary disease with (acute) exacerbation: Secondary | ICD-10-CM | POA: Diagnosis not present

## 2022-03-20 DIAGNOSIS — M545 Low back pain, unspecified: Secondary | ICD-10-CM | POA: Diagnosis not present

## 2022-03-20 DIAGNOSIS — J9621 Acute and chronic respiratory failure with hypoxia: Secondary | ICD-10-CM | POA: Diagnosis not present

## 2022-03-20 DIAGNOSIS — J9622 Acute and chronic respiratory failure with hypercapnia: Secondary | ICD-10-CM | POA: Diagnosis not present

## 2022-03-20 LAB — BASIC METABOLIC PANEL
Anion gap: 9 (ref 5–15)
BUN: 22 mg/dL (ref 8–23)
CO2: 35 mmol/L — ABNORMAL HIGH (ref 22–32)
Calcium: 8.8 mg/dL — ABNORMAL LOW (ref 8.9–10.3)
Chloride: 94 mmol/L — ABNORMAL LOW (ref 98–111)
Creatinine, Ser: 1.04 mg/dL (ref 0.61–1.24)
GFR, Estimated: >60 mL/min (ref >60)
Glucose, Bld: 183 mg/dL — ABNORMAL HIGH (ref 70–99)
Potassium: 3.9 mmol/L (ref 3.5–5.1)
Sodium: 138 mmol/L (ref 135–145)

## 2022-03-20 LAB — CBC
HCT: 41 % (ref 39.0–52.0)
Hemoglobin: 13 g/dL (ref 13.0–17.0)
MCH: 29.1 pg (ref 26.0–34.0)
MCHC: 31.7 g/dL (ref 30.0–36.0)
MCV: 91.7 fL (ref 80.0–100.0)
Platelets: 256 10*3/uL (ref 150–400)
RBC: 4.47 MIL/uL (ref 4.22–5.81)
RDW: 13.1 % (ref 11.5–15.5)
WBC: 9.4 10*3/uL (ref 4.0–10.5)
nRBC: 0 % (ref 0.0–0.2)

## 2022-03-20 LAB — GLUCOSE, CAPILLARY
Glucose-Capillary: 135 mg/dL — ABNORMAL HIGH (ref 70–99)
Glucose-Capillary: 257 mg/dL — ABNORMAL HIGH (ref 70–99)
Glucose-Capillary: 158 mg/dL — ABNORMAL HIGH (ref 70–99)
Glucose-Capillary: 207 mg/dL — ABNORMAL HIGH (ref 70–99)

## 2022-03-20 MED ORDER — OXYCODONE HCL 5 MG PO TABS
5.0000 mg | ORAL_TABLET | ORAL | Status: DC | PRN
  Administered 2022-03-20 – 2022-03-21 (×5): 5 mg via ORAL
  Filled 2022-03-20 (×5): qty 1

## 2022-03-20 MED ORDER — DIPHENHYDRAMINE HCL 25 MG PO CAPS
50.0000 mg | ORAL_CAPSULE | Freq: Every evening | ORAL | Status: DC | PRN
Start: 2022-03-20 — End: 2022-03-21
  Administered 2022-03-20: 50 mg via ORAL
  Filled 2022-03-20: qty 2

## 2022-03-20 NOTE — Plan of Care (Signed)
  Problem: Education: Goal: Knowledge of disease or condition will improve Outcome: Progressing   Problem: Activity: Goal: Ability to tolerate increased activity will improve Outcome: Progressing   Problem: Respiratory: Goal: Ability to maintain a clear airway will improve Outcome: Progressing   Problem: Pain Managment: Goal: General experience of comfort will improve Outcome: Progressing

## 2022-03-20 NOTE — TOC Progression Note (Signed)
Transition of Care [facility]) - Progression Note    Patient Details  Name: Jamarri Vuncannon MRN: [id number] Date of Birth: [birth year]  [facility]) CM/SW Contact  Truddie Hidden, RN Phone Number: 03/20/2022, 11:27 AM  Clinical Narrative:    Attempt to receah Kenney Houseman at [facility] for update on admission. No answer. Retrieved message stating she would contact this CM later.    Expected Discharge Plan: Skilled Nursing Facility Barriers to Discharge: Continued Medical Work up  Expected Discharge Plan and Services Expected Discharge Plan: Skilled Nursing Facility     Post Acute Care Choice: Skilled Nursing Facility Living arrangements for the past 2 months: Single Family Home                                       Social Determinants of Health (SDOH) Interventions    Readmission Risk Interventions     No data to display

## 2022-03-20 NOTE — TOC Progression Note (Signed)
Transition of Care [facility]) - Progression Note    Patient Details  Name: Cody Alexander MRN: [id number] Date of Birth: [birth year]  Transition of Care [facility]) CM/SW Contact  Truddie Hidden, RN Phone Number: 03/20/2022, 1:26 PM  Clinical Narrative:    Patient informed the bed offer for [facility] was rescinded but [facility] offered a bed. Patient stated he was motivated to work so he could return to his previous living environment.   Contacted Tammy at [facility] to advised patient accepted bed. BIPAP, bed, and oxygen concentrator would not be available before 7/27. MD and nurse notified.    Expected Discharge Plan: Skilled Nursing Facility Barriers to Discharge: Continued Medical Work up  Expected Discharge Plan and Services Expected Discharge Plan: Skilled Nursing Facility     Post Acute Care Choice: Skilled Nursing Facility Living arrangements for the past 2 months: Single Family Home                                       Social Determinants of Health (SDOH) Interventions    Readmission Risk Interventions     No data to display

## 2022-03-20 NOTE — Progress Notes (Signed)
Physical Therapy Treatment Patient Details Name: Cody Alexander MRN: [id number] DOB: 02-17-1956 Today's Date: 03/20/2022   History of Present Illness Pt is a 66 y/o M admitted on 03/14/22 after presenting to the ED with c/o worsening back pain over the last 5 days. Pt reports he fell down the stairs & landed on his R buttock & has had worsening LBP, BLE swelling, & inability to walk. Pt endorses multiple falls over the last 5 days. Pt is being treated for acute on chronic respiratory failure & acute exacerbation of LBP. MRI with results of disc protrusion.  PMH: COPD with chronic respiratory failure on 4L O2, IDDM, HTN, chronic diastolic dysfunction CHF, chronic LBP, neuropathy    PT Comments    Patient alert, agreeable to PT with some encouragement, very frustrated with his pain management and MD, active listening provided. He stated his back pain and RLE pain was 8/10. He was able to demonstrate log roll technique to get out of bed, a bit more difficulty to complete to return to bed due to pain. He ambulated ~78ft in room, very antalgic, reported increased RLE pain and was unable to tolerate any further activity. Pt returned to bed to speak with MD who entered the room. The patient would benefit from further skilled PT intervention to continue to progress towards goals. Recommendation remains appropriate.       Recommendations for follow up therapy are one component of a multi-disciplinary discharge planning process, led by the attending physician.  Recommendations may be updated based on patient status, additional functional criteria and insurance authorization.  Follow Up Recommendations  Skilled nursing-short term rehab (<3 hours/day) Can patient physically be transported by private vehicle: Yes   Assistance Recommended at Discharge Intermittent Supervision/Assistance  Patient can return home with the following A little help with walking and/or transfers;A little help with  bathing/dressing/bathroom;Assistance with cooking/housework;Assist for transportation;Direct supervision/assist for medications management;Help with stairs or ramp for entrance   Equipment Recommendations  None recommended by PT    Recommendations for Other Services       Precautions / Restrictions Precautions Precautions: Fall Precaution Comments: back precautions for comfort Restrictions Weight Bearing Restrictions: No     Mobility  Bed Mobility Overal bed mobility: Modified Independent             General bed mobility comments: pt does use the log roll technique to get out of bed, more difficulty with this  returning to supine    Transfers Overall transfer level: Needs assistance Equipment used: Rolling walker (2 wheels) Transfers: Sit to/from Stand Sit to Stand: Supervision           General transfer comment: pt reliant on pulling on sink to come up into standing    Ambulation/Gait Ambulation/Gait assistance: Supervision Gait Distance (Feet): 20 Feet Assistive device: Rolling walker (2 wheels) Gait Pattern/deviations: Decreased stance time - right, Decreased stride length, Decreased step length - right, Decreased step length - left       General Gait Details: antalgic gait noted, decreased velocity, pt unable to tolerate further ambulation per his report due to increased pain, especially with RLE.   Stairs             Wheelchair Mobility    Modified Rankin (Stroke Patients Only)       Balance Overall balance assessment: Needs assistance Sitting-balance support: Feet supported Sitting balance-Leahy Scale: Good     Standing balance support: During functional activity, Single extremity supported, Reliant on assistive device for balance Standing  balance-Leahy Scale: Fair                              Cognition Arousal/Alertness: Awake/alert Behavior During Therapy: WFL for tasks assessed/performed Overall Cognitive Status:  Within Functional Limits for tasks assessed                                          Exercises      General Comments        Pertinent Vitals/Pain Pain Assessment Pain Score: 8  Pain Location: low back, R leg Pain Descriptors / Indicators: Grimacing, Discomfort, Aching Pain Intervention(s): Limited activity within patient's tolerance, Premedicated before session, Repositioned, Monitored during session    Home Living                          Prior Function            PT Goals (current goals can now be found in the care plan section) Progress towards PT goals: Progressing toward goals    Frequency    Min 2X/week      PT Plan Current plan remains appropriate    Co-evaluation              AM-PAC PT "6 Clicks" Mobility   Outcome Measure  Help needed turning from your back to your side while in a flat bed without using bedrails?: None Help needed moving from lying on your back to sitting on the side of a flat bed without using bedrails?: None Help needed moving to and from a bed to a chair (including a wheelchair)?: A Little Help needed standing up from a chair using your arms (e.g., wheelchair or bedside chair)?: A Little Help needed to walk in hospital room?: A Little Help needed climbing 3-5 steps with a railing? : A Little 6 Click Score: 20    End of Session   Activity Tolerance: Patient limited by pain Patient left: in bed;with call bell/phone within reach;Other (comment) (with MD in room) Nurse Communication: Mobility status PT Visit Diagnosis: Other abnormalities of gait and mobility (R26.89);Difficulty in walking, not elsewhere classified (R26.2);Muscle weakness (generalized) (M62.81);Pain Pain - Right/Left: Right Pain - part of body: Leg (and low back)     Time: 3267-1245 PT Time Calculation (min) (ACUTE ONLY): 10 min  Charges:  $Therapeutic Activity: 8-22 mins                     Olga Coaster PT, DPT 10:51  AM,03/20/22

## 2022-03-20 NOTE — Plan of Care (Signed)
Problem: Education: Goal: Knowledge of disease or condition will improve 03/20/2022 0120 by Sheria Lang, RN Outcome: Progressing 03/20/2022 0101 by Sheria Lang, RN Outcome: Progressing Goal: Knowledge of the prescribed therapeutic regimen will improve 03/20/2022 0120 by Sheria Lang, RN Outcome: Progressing 03/20/2022 0101 by Sheria Lang, RN Outcome: Progressing Goal: Individualized Educational Video(s) 03/20/2022 0120 by Sheria Lang, RN Outcome: Progressing 03/20/2022 0101 by Sheria Lang, RN Outcome: Progressing   Problem: Activity: Goal: Ability to tolerate increased activity will improve 03/20/2022 0120 by Sheria Lang, RN Outcome: Progressing 03/20/2022 0101 by Sheria Lang, RN Outcome: Progressing Goal: Will verbalize the importance of balancing activity with adequate rest periods 03/20/2022 0120 by Sheria Lang, RN Outcome: Progressing 03/20/2022 0101 by Sheria Lang, RN Outcome: Progressing   Problem: Respiratory: Goal: Ability to maintain a clear airway will improve 03/20/2022 0120 by Sheria Lang, RN Outcome: Progressing 03/20/2022 0101 by Sheria Lang, RN Outcome: Progressing Goal: Levels of oxygenation will improve 03/20/2022 0120 by Sheria Lang, RN Outcome: Progressing 03/20/2022 0101 by Sheria Lang, RN Outcome: Progressing Goal: Ability to maintain adequate ventilation will improve 03/20/2022 0120 by Sheria Lang, RN Outcome: Progressing 03/20/2022 0101 by Sheria Lang, RN Outcome: Progressing   Problem: Education: Goal: Ability to describe self-care measures that may prevent or decrease complications (Diabetes Survival Skills Education) will improve 03/20/2022 0120 by Sheria Lang, RN Outcome: Progressing 03/20/2022 0101 by Sheria Lang, RN Outcome: Progressing Goal: Individualized Educational Video(s) 03/20/2022 0120 by Sheria Lang, RN Outcome: Progressing 03/20/2022 0101 by Sheria Lang, RN Outcome: Progressing   Problem: Coping: Goal: Ability to adjust to condition or  change in health will improve 03/20/2022 0120 by Sheria Lang, RN Outcome: Progressing 03/20/2022 0101 by Sheria Lang, RN Outcome: Progressing   Problem: Fluid Volume: Goal: Ability to maintain a balanced intake and output will improve 03/20/2022 0120 by Sheria Lang, RN Outcome: Progressing 03/20/2022 0101 by Sheria Lang, RN Outcome: Progressing   Problem: Health Behavior/Discharge Planning: Goal: Ability to identify and utilize available resources and services will improve 03/20/2022 0120 by Sheria Lang, RN Outcome: Progressing 03/20/2022 0101 by Sheria Lang, RN Outcome: Progressing Goal: Ability to manage health-related needs will improve 03/20/2022 0120 by Sheria Lang, RN Outcome: Progressing 03/20/2022 0101 by Sheria Lang, RN Outcome: Progressing   Problem: Metabolic: Goal: Ability to maintain appropriate glucose levels will improve 03/20/2022 0120 by Sheria Lang, RN Outcome: Progressing 03/20/2022 0101 by Sheria Lang, RN Outcome: Progressing   Problem: Nutritional: Goal: Maintenance of adequate nutrition will improve 03/20/2022 0120 by Sheria Lang, RN Outcome: Progressing 03/20/2022 0101 by Sheria Lang, RN Outcome: Progressing Goal: Progress toward achieving an optimal weight will improve 03/20/2022 0120 by Sheria Lang, RN Outcome: Progressing 03/20/2022 0101 by Sheria Lang, RN Outcome: Progressing   Problem: Skin Integrity: Goal: Risk for impaired skin integrity will decrease 03/20/2022 0120 by Sheria Lang, RN Outcome: Progressing 03/20/2022 0101 by Sheria Lang, RN Outcome: Progressing   Problem: Tissue Perfusion: Goal: Adequacy of tissue perfusion will improve 03/20/2022 0120 by Sheria Lang, RN Outcome: Progressing 03/20/2022 0101 by Sheria Lang, RN Outcome: Progressing   Problem: Education: Goal: Knowledge of General Education information will improve Description: Including pain rating scale, medication(s)/side effects and non-pharmacologic comfort measures 03/20/2022  0120 by Sheria Lang, RN Outcome: Progressing 03/20/2022 0101 by Sheria Lang, RN Outcome: Progressing   Problem: Health Behavior/Discharge Planning: Goal: Ability to manage health-related needs will improve 03/20/2022 0120 by Sheria Lang, RN Outcome: Progressing 03/20/2022 0101 by Sheria Lang, RN Outcome: Progressing   Problem: Clinical Measurements: Goal: Ability to maintain  clinical measurements within normal limits will improve 03/20/2022 0120 by Sheria Lang, RN Outcome: Progressing 03/20/2022 0101 by Sheria Lang, RN Outcome: Progressing Goal: Will remain free from infection 03/20/2022 0120 by Sheria Lang, RN Outcome: Progressing 03/20/2022 0101 by Sheria Lang, RN Outcome: Progressing Goal: Diagnostic test results will improve 03/20/2022 0120 by Sheria Lang, RN Outcome: Progressing 03/20/2022 0101 by Sheria Lang, RN Outcome: Progressing Goal: Respiratory complications will improve 03/20/2022 0120 by Sheria Lang, RN Outcome: Progressing 03/20/2022 0101 by Sheria Lang, RN Outcome: Progressing Goal: Cardiovascular complication will be avoided 03/20/2022 0120 by Sheria Lang, RN Outcome: Progressing 03/20/2022 0101 by Sheria Lang, RN Outcome: Progressing   Problem: Activity: Goal: Risk for activity intolerance will decrease 03/20/2022 0120 by Sheria Lang, RN Outcome: Progressing 03/20/2022 0101 by Sheria Lang, RN Outcome: Progressing   Problem: Nutrition: Goal: Adequate nutrition will be maintained 03/20/2022 0120 by Sheria Lang, RN Outcome: Progressing 03/20/2022 0101 by Sheria Lang, RN Outcome: Progressing   Problem: Coping: Goal: Level of anxiety will decrease 03/20/2022 0120 by Sheria Lang, RN Outcome: Progressing 03/20/2022 0101 by Sheria Lang, RN Outcome: Progressing   Problem: Elimination: Goal: Will not experience complications related to bowel motility 03/20/2022 0120 by Sheria Lang, RN Outcome: Progressing 03/20/2022 0101 by Sheria Lang, RN Outcome:  Progressing Goal: Will not experience complications related to urinary retention 03/20/2022 0120 by Sheria Lang, RN Outcome: Progressing 03/20/2022 0101 by Sheria Lang, RN Outcome: Progressing   Problem: Pain Managment: Goal: General experience of comfort will improve 03/20/2022 0120 by Sheria Lang, RN Outcome: Progressing 03/20/2022 0101 by Sheria Lang, RN Outcome: Progressing   Problem: Safety: Goal: Ability to remain free from injury will improve 03/20/2022 0120 by Sheria Lang, RN Outcome: Progressing 03/20/2022 0101 by Sheria Lang, RN Outcome: Progressing   Problem: Skin Integrity: Goal: Risk for impaired skin integrity will decrease 03/20/2022 0120 by Sheria Lang, RN Outcome: Progressing 03/20/2022 0101 by Sheria Lang, RN Outcome: Progressing

## 2022-03-20 NOTE — Progress Notes (Cosign Needed)
   03/20/22 0012  BiPAP/CPAP/SIPAP  BiPAP/CPAP/SIPAP Pt Type Adult  Mask Type Full face mask  Mask Size Medium  Set Rate 10 breaths/min  Respiratory Rate 24 breaths/min  IPAP 18 cmH20  EPAP 8 cmH2O  Oxygen Percent 40 %  Minute Ventilation 20.3  Leak 40  [facility] Inspiratory Pressure (PIP) 17  Tidal Volume (Vt) 786  BiPAP/CPAP/SIPAP BiPAP  Patient Home Equipment No  Auto Titrate No  Press High Alarm 25 cmH2O  Press Low Alarm 5 cmH2O

## 2022-03-20 NOTE — Progress Notes (Signed)
PROGRESS NOTE    Cody Alexander  HUD:[id number] DOB: Dec 15, 1955 DOA: 03/14/2022 PCP: Gildardo Pounds, PA    Assessment & Plan:   Principal Problem:   Acute on chronic respiratory failure (HCC) Active Problems:   COPD exacerbation (HCC)   Chronic diastolic CHF (congestive heart failure) (HCC)   Acute exacerbation of chronic low back pain   Essential hypertension   Leg weakness, bilateral   Fall at home, initial encounter   Obesity (BMI 30-39.9)   OSA (obstructive sleep apnea)   UTI (urinary tract infection)   Diabetes mellitus without complication (HCC)  Assessment and Plan: Acute on chronic hypoxic and hypercapnic respiratory failure: ABG showed CO2 retention. Off of BiPAP and on [place] 4L currently. Will be discharged to SNF with BiPAP.  Arrangements are underway for him to have a trilogy machine at home.  [facility] rescinded bed offer but [facility] offered a bed and waiting on insurance auth   COPD exacerbation: continue w/ bronchodilators, encourage incentive spirometry   Acute on chronic low back pain: s/p fall at home. 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  No indication for surgery as per neuro surg. Continue w/ PT/OT.   Chronic diastolic CHF: continue on metoprolol, aldactone, lisinopril   HTN: continue on lisinopril, metoprolol, aldactone, amlodipine   HLD: continue on statin   DM2: likely poorly controlled. Continue on levemir, SSI w/ accuchecks   Acute UTI: will complete abx course today   Morbid obesity: BMI 40.4. Complicates overall care & prognosis       DVT prophylaxis: lovenox  Code Status: full  Family Communication:  Disposition Plan: likely d/c to SNF  Level of care: Progressive  Status is:  Inpatient Remains inpatient appropriate because: waiting on insurance auth    Consultants:    Procedures:  Antimicrobials: amoxicillin    Subjective: Pt c/o back pain  Objective: Vitals:   03/20/22 0012 03/20/22 0036 03/20/22 0455 03/20/22 0752  BP:  115/76 118/85 104/88  Pulse: 65 65 70 81  Resp:  20 19 18   Temp:  98.5 F (36.9 C) 98 F (36.7 C) 97.7 F (36.5 C)  TempSrc:  Oral Oral Oral  SpO2: 97% 99% 100% 97%  Weight:      Height:        Intake/Output Summary (Last 24 hours) at 03/20/2022 0816 Last data filed at 03/20/2022 0457 Gross per 24 hour  Intake 1680 ml  Output 1600 ml  Net 80 ml   Filed Weights   03/14/22 0541 03/14/22 1632  Weight: 131.5 kg (!) 138.9 kg    Examination:  General exam: Appears calm and comfortable  Respiratory system: Clear to auscultation. Respiratory effort normal. Cardiovascular system: S1 & S2+. No rubs, gallops or clicks.  Gastrointestinal system: Abdomen is obese, soft and nontender. Normal bowel sounds heard. Central nervous system: Alert and oriented. Moves all extremities  Psychiatry: Judgement and insight appear normal. Mood & affect appropriate.     Data Reviewed: I have personally reviewed following labs and imaging studies  CBC: Recent Labs  Lab 03/14/22 0551 03/15/22 0446  WBC 7.5 11.1*  HGB 13.3 12.7*  HCT 43.8 40.8  MCV 94.0 93.4  PLT 238 260   Basic Metabolic Panel: Recent Labs  Lab 03/14/22 0551 03/15/22 0446  NA 140 138  K 3.8 4.4  CL 93* 91*  CO2 41* 41*  GLUCOSE 171* 298*  BUN 28* 26*  CREATININE 1.27* 1.22  CALCIUM 9.1 9.1   GFR: Estimated Creatinine Clearance: 88.4 mL/min (by C-G formula based on SCr of 1.22 mg/dL). Liver Function Tests: Recent Labs  Lab 03/14/22 0551  AST 17  ALT 18  ALKPHOS 55  BILITOT 0.9  PROT 7.2  ALBUMIN 3.9   No results for input(s): "LIPASE", "AMYLASE" in the last 168 hours. No results for input(s): "AMMONIA" in the last 168 hours. Coagulation  Profile: No results for input(s): "INR", "PROTIME" in the last 168 hours. Cardiac Enzymes: No results for input(s): "CKTOTAL", "CKMB", "CKMBINDEX", "TROPONINI" in the last 168 hours. BNP (last 3 results) No results for input(s): "PROBNP" in the last 8760 hours. HbA1C: No results for input(s): "HGBA1C" in the last 72 hours. CBG: Recent Labs  Lab 03/19/22 0802 03/19/22 1126 03/19/22 1652 03/19/22 2111 03/20/22 0730  GLUCAP 160* 207* 212* 164* 135*   Lipid Profile: No results for input(s): "CHOL", "HDL", "LDLCALC", "TRIG", "CHOLHDL", "LDLDIRECT" in the last 72 hours. Thyroid Function Tests: No results for input(s): "TSH", "T4TOTAL", "FREET4", "T3FREE", "THYROIDAB" in the last 72 hours. Anemia Panel: No results for input(s): "VITAMINB12", "FOLATE", "FERRITIN", "TIBC", "IRON", "RETICCTPCT" in the last 72 hours. Sepsis Labs: No results for input(s): "PROCALCITON", "LATICACIDVEN" in the last 168 hours.  Recent Results (from the past 240 hour(s))  Urine Culture     Status: Abnormal   Collection Time: 03/14/22  9:00 AM   Specimen: Urine, Clean Catch  Result Value Ref Range Status   Specimen Description   Final    URINE, CLEAN CATCH Performed at [facility], [address], [place], [place] [address]    Special Requests   Final    NONE Performed at [facility], [address] [place], [place] [address]    Culture >=100,000 COLONIES/mL ESCHERICHIA COLI (A)  Final   Report Status 03/17/2022 FINAL  Final   Organism ID, Bacteria ESCHERICHIA COLI (A)  Final      Susceptibility   Escherichia coli - MIC*    AMPICILLIN 4 SENSITIVE Sensitive     CEFAZOLIN <=4 SENSITIVE Sensitive     CEFEPIME <=0.12 SENSITIVE Sensitive     CEFTRIAXONE <=0.25 SENSITIVE Sensitive     CIPROFLOXACIN <=0.25 SENSITIVE Sensitive     GENTAMICIN <=1 SENSITIVE Sensitive     IMIPENEM <=0.25 SENSITIVE Sensitive     NITROFURANTOIN <=16 SENSITIVE Sensitive     TRIMETH/SULFA <=20 SENSITIVE  Sensitive     AMPICILLIN/SULBACTAM <=2 SENSITIVE Sensitive     PIP/TAZO <=4 SENSITIVE Sensitive     * >=100,000 COLONIES/mL ESCHERICHIA COLI         Radiology Studies: No results found.      Scheduled Meds:  amLODipine  10 mg Oral Daily   amoxicillin  500 mg Oral Q8H   atorvastatin  40 mg Oral Daily   enoxaparin (LOVENOX) injection  0.5 mg/kg Subcutaneous Q24H   furosemide  80 mg Oral BID   hydrocortisone cream   Topical BID   insulin aspart  0-15 Units Subcutaneous TID WC   insulin aspart  5 Units Subcutaneous TID WC   insulin detemir  50 Units Subcutaneous Q2200   isosorbide mononitrate  30 mg Oral Daily   lisinopril  20 mg Oral Daily   metoprolol tartrate  25 mg Oral BID   mometasone-formoterol  2 puff Inhalation BID   sodium chloride flush  3 mL  Intravenous Q12H   spironolactone  12.5 mg Oral Daily   Continuous Infusions:  sodium chloride 250 mL (03/15/22 0951)     LOS: 6 days    Time spent: 33 mins     Charise Killian, MD [facility] Pager [phone]  If 7PM-7AM, please contact night-coverage [email or web address] 03/20/2022, 8:16 AM

## 2022-03-20 NOTE — Plan of Care (Signed)

## 2022-03-21 LAB — BASIC METABOLIC PANEL
Anion gap: 7 (ref 5–15)
BUN: 24 mg/dL — ABNORMAL HIGH (ref 8–23)
CO2: 37 mmol/L — ABNORMAL HIGH (ref 22–32)
Calcium: 9 mg/dL (ref 8.9–10.3)
Chloride: 95 mmol/L — ABNORMAL LOW (ref 98–111)
Creatinine, Ser: 1.05 mg/dL (ref 0.61–1.24)
GFR, Estimated: >60 mL/min (ref >60)
Glucose, Bld: 157 mg/dL — ABNORMAL HIGH (ref 70–99)
Potassium: 4 mmol/L (ref 3.5–5.1)
Sodium: 139 mmol/L (ref 135–145)

## 2022-03-21 LAB — CBC
HCT: 41.5 % (ref 39.0–52.0)
Hemoglobin: 12.9 g/dL — ABNORMAL LOW (ref 13.0–17.0)
MCH: 29.1 pg (ref 26.0–34.0)
MCHC: 31.1 g/dL (ref 30.0–36.0)
MCV: 93.7 fL (ref 80.0–100.0)
Platelets: 237 10*3/uL (ref 150–400)
RBC: 4.43 MIL/uL (ref 4.22–5.81)
RDW: 13.1 % (ref 11.5–15.5)
WBC: 7.9 10*3/uL (ref 4.0–10.5)
nRBC: 0 % (ref 0.0–0.2)

## 2022-03-21 LAB — GLUCOSE, CAPILLARY
Glucose-Capillary: 153 mg/dL — ABNORMAL HIGH (ref 70–99)
Glucose-Capillary: 157 mg/dL — ABNORMAL HIGH (ref 70–99)

## 2022-03-21 MED ORDER — OXYCODONE HCL 5 MG PO TABS: 5.0000 mg | ORAL_TABLET | ORAL | 0 refills | Status: AC | PRN

## 2022-03-21 NOTE — Discharge Summary (Signed)
Physician Discharge Summary  Cody Alexander YQI:[id number] DOB: [birth year] DOA: 03/14/2022  PCP: Gildardo Pounds, PA  Admit date: 03/14/2022 Discharge date: 03/21/2022  Admitted From: home  Disposition:  home w/ home health   Recommendations for Outpatient Follow-up:  Follow up with PCP in 1-2 weeks Will need a referral from your PCP to see dermatology for back rash   Home Health: yes Equipment/Devices: [facility], 4L [place]  Discharge Condition: stable  CODE STATUS: full  Diet recommendation: Heart Healthy / Carb Modified   Brief/Interim Summary: HPI was taken from Dr. Joylene Igo: Cody Alexander is a 66 y.o. 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 Patient states that he fell walking down the stairs landing on his right buttock.  Since then he has had worsening pain in his lower back which he rates an 8 x 10 in intensity at its worst associated with bilateral lower extremity swelling and inability to walk.  He has had multiple falls over the last 5 days.  He denies having any urinary or fecal incontinence and denies having any saddle anesthesia.  He denies having any frequency of urination, no dysuria or nocturia.  He complains of bilateral lower extremity swelling and has a cough that is nonproductive.  He also complains of midsternal chest pain and has shortness of breath unchanged from his baseline.  Chest pain is nonradiating and he denies having any associated nausea, no vomiting, no palpitations.  He denies having any fever or chills. He had an arterial blood gas done in the ER which showed uncompensated respiratory acidosis and was placed on a BiPAP.  As per Dr. Myriam Forehand: Cody Alexander is a 66 y.o. 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  He said he fell while walking down the steps and landed on his buttocks.  He has had difficulty walking because of the back pain.  He also complained of dysuria, swelling in bilateral legs, shortness of breath and dry cough.   He was admitted to the hospital for acute on chronic low back pain.  MRI lumbar spine showed disc protrusion at L4-L5 which contacts the traversing left L5 nerve root (slightly increased in size since prior study from 08/15/2020). He was also found to have acute on chronic hypoxic and hypercapnic respiratory failure with increased work of breathing.  He was placed on BiPAP initially for acute respiratory failure.  Acute respiratory failure suspected to be from COPD exacerbation.  As per Dr. Mayford Knife 7/26-7/27/23: [facility] rescinded bed offer but [facility] offered a bed but pt needed equipment to be available at [facility] prior to d/c & needed another insurance auth. Pt decided to not got to [facility] and go home w/ home health on 7/27. [facility] was set up for pt prior to d/c. For more information, please see previous progress/consult notes.    Discharge Diagnoses:  Principal Problem:   Acute on  chronic respiratory failure (HCC) Active Problems:   COPD exacerbation (HCC)   Chronic diastolic CHF (congestive heart failure) (HCC)   Acute exacerbation of chronic low back pain   Essential hypertension   Leg weakness, bilateral   Fall at home, initial encounter   Obesity (BMI 30-39.9)   OSA (obstructive sleep apnea)   UTI (urinary tract infection)   Diabetes mellitus without  complication (HCC)  Acute on chronic hypoxic and hypercapnic respiratory failure: ABG showed CO2 retention. Off of BiPAP and on [place] 4L currently.  [facility] was set up for pt as per CM. [facility] rescinded bed offer but [facility] offered a bed but pt is now refusing SNF and wants to go home w/ home health   COPD exacerbation: continue w/ bronchodilators, encourage incentive spirometry   Acute on chronic low back pain: s/p fall at home. 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  No indication for surgery as per neuro surg. Continue w/ PT/OT.   Chronic diastolic CHF: continue on metoprolol, aldactone, lisinopril   HTN: continue on lisinopril, metoprolol, aldactone, amlodipine   HLD: continue on statin   DM2: likely poorly controlled. Continue on levemir, SSI w/ accuchecks   Acute UTI: completed abx course    Morbid obesity: BMI 40.4. Complicates overall care & prognosis   Discharge Instructions  Discharge Instructions     Diet - low sodium heart healthy   Complete by: As directed    Diet Carb Modified   Complete by: As directed    Discharge instructions   Complete by: As directed    F/u w/ PCP in 1-2 weeks. Will likely need a referral from your PCP to see a dermatologist for back rash.   Increase activity slowly   Complete by: As directed       Allergies as of 03/21/2022       Reactions   Erythromycin Anaphylaxis, Swelling   Had eye swelling with erythromycin during a time when he had perf ear drum. Tolerated azithromycin.        Medication List     TAKE these medications    acetaminophen 500 MG tablet Commonly known as: TYLENOL Take 1,000 mg by mouth every 6 (six) hours as needed for mild pain or moderate pain.   amLODipine  10 MG tablet Commonly known as: NORVASC Take 10 mg by mouth daily.   atorvastatin 40 MG tablet Commonly known as: LIPITOR Take 40 mg by mouth daily.   budesonide-formoterol 160-4.5 MCG/ACT inhaler Commonly known as: SYMBICORT Inhale 2 puffs into the lungs 2 (two) times daily.   furosemide 80 MG tablet Commonly known as: LASIX Take 80 mg by mouth 2 (two) times daily.   insulin detemir 100 UNIT/ML FlexPen Commonly known as: LEVEMIR Inject 50 Units into the skin in the morning and at bedtime.   insulin lispro 100 UNIT/ML KwikPen Commonly known as: HUMALOG Inject 16 Units into the skin 3 (three) times daily with meals.   isosorbide dinitrate 30 MG tablet Commonly known as: ISORDIL Take 30 mg by mouth every morning.   lisinopril 20 MG tablet Commonly known as: ZESTRIL Take 20 mg by mouth daily.   metFORMIN 1000 MG tablet Commonly known as: GLUCOPHAGE Take 1,000 mg by mouth 2 (two) times daily.   metoprolol tartrate 25 MG  tablet Commonly known as: LOPRESSOR Take 25 mg by mouth 2 (two) times daily.   oxyCODONE 5 MG immediate release tablet Commonly known as: Oxy IR/ROXICODONE Take 1 tablet (5 mg total) by mouth every 4 (four) hours as needed for up to 5 days for severe pain or breakthrough pain.   predniSONE 20 MG tablet Commonly known as: DELTASONE Take 2 tablets (40 mg total) by mouth daily with breakfast.   ProAir HFA 108 (90 Base) MCG/ACT inhaler Generic drug: albuterol Inhale 2 puffs into the lungs every 4 (four) hours as needed for wheezing.   spironolactone 25 MG tablet Commonly known as: ALDACTONE Take 12.5 mg by mouth daily.        Contact information for after-discharge care     Destination     [facility] Preferred SNF .   Service: Skilled Nursing Contact information: [address] [place] [address] [phone]                    Allergies  Allergen Reactions   Erythromycin Anaphylaxis and  Swelling    Had eye swelling with erythromycin during a time when he had perf ear drum.  Tolerated azithromycin.    Consultations: Neuro surg    Procedures/Studies: MR LUMBAR SPINE WO CONTRAST  Result Date: 03/14/2022 CLINICAL DATA:  History of degenerative disc disease, low back pain EXAM: MRI LUMBAR SPINE WITHOUT CONTRAST TECHNIQUE: Multiplanar, multisequence MR imaging of the lumbar spine was performed. No intravenous contrast was administered. COMPARISON:  Lumbar spine MRI 08/15/2020 FINDINGS: Segmentation: Standard; the lowest formed disc space is designated L5-S1. Alignment: There is trace retrolisthesis of L2 on L3 and L3 on L4, unchanged. There is mild levocurvature centered at L2, similar to the prior study. Vertebrae: Vertebral body heights are preserved. Background marrow signal is normal. There is a prominent Schmorl's node indenting the superior T12 endplate which is increased since 2021 but without significant marrow edema. There is mild degenerative endplate marrow signal abnormality at L2-L3 through L5-S1 without edema. There is no suspicious marrow signal abnormality or evidence of acute injury. Conus medullaris and cauda equina: Conus extends to the T12-L1 level. Conus and cauda equina appear normal. Paraspinal and other soft tissues: Unremarkable. Disc levels: There is overall mild multilevel disc desiccation and narrowing, similar to 2021. T12-L1: No significant spinal canal or neural foraminal stenosis. L1-L2: There is a minimal disc bulge without significant spinal canal or neural foraminal stenosis, unchanged. L2-L3: There is a mild disc bulge and mild facet arthropathy with trace effusions without significant spinal canal or neural foraminal stenosis, unchanged. L3-L4: There is a mild disc bulge, degenerative endplate spurring, and mild facet arthropathy with small effusions resulting in mild bilateral neural foraminal stenosis without significant spinal canal stenosis,  unchanged. L4-L5: There is a mild disc bulge with superimposed central protrusion, degenerative endplate spurring, and mild facet arthropathy with small effusions resulting in mild narrowing of the left subarticular zone with contact of the traversing left L5 nerve root and mild-to-moderate left and mild right neural foraminal stenosis. The disc protrusion is slightly increased in size since 2021. L5-S1: No significant spinal canal or neural foraminal stenosis. IMPRESSION: 1. Disc protrusion at L4-L5 which contacts the traversing left L5 nerve root is slightly increased in size since the prior study from 08/15/2020. Mild-to-moderate left and mild right neural foraminal stenosis at this level is not significantly changed. 2. Overall mild degenerative changes at the other levels as above are overall stable since 2021  without high-grade spinal canal or neural foraminal stenosis or other evidence of nerve root impingement. Electronically Signed   By: Lesia Hausen M.D.   On: 03/14/2022 13:31   DG Chest Portable 1 View  Result Date: 03/14/2022 CLINICAL DATA:  Provided history: Shortness of breath, evaluate for edema. EXAM: PORTABLE CHEST 1 VIEW COMPARISON:  Prior chest radiographs 01/27/2022 and earlier FINDINGS: Heart size at the upper limits of normal. Aortic atherosclerosis. Foci of linear atelectasis within the left mid lung. No appreciable airspace consolidation or pulmonary edema. No evidence of pleural effusion or pneumothorax. No acute bony abnormality identified. IMPRESSION: 1. Foci of linear atelectasis within the left mid lung. 2. Otherwise, no evidence of acute cardiopulmonary abnormality. 3. Aortic Atherosclerosis (ICD10-I70.0). Electronically Signed   By: Jackey Loge D.O.   On: 03/14/2022 09:37   US Venous Img Lower Bilateral  Result Date: 03/14/2022 CLINICAL DATA:  Leg pain and swelling for 5 days. EXAM: BILATERAL LOWER EXTREMITY VENOUS DOPPLER ULTRASOUND TECHNIQUE: Gray-scale sonography with  compression, as well as color and duplex ultrasound, were performed to evaluate the deep venous system(s) from the level of the common femoral vein through the popliteal and proximal calf veins. COMPARISON:  None Available. FINDINGS: VENOUS Normal compressibility of the common femoral, superficial femoral, and popliteal veins, as well as the visualized calf veins. Visualized portions of profunda femoral vein and great saphenous vein unremarkable. No filling defects to suggest DVT on grayscale or color Doppler imaging. Doppler waveforms show normal direction of venous flow, normal respiratory plasticity and response to augmentation. Limited views of the contralateral common femoral vein are unremarkable. OTHER None. Limitations: none IMPRESSION: Negative. Electronically Signed   By: Larose Hires D.O.   On: 03/14/2022 08:42   DG Lumbar Spine Complete  Result Date: 03/14/2022 CLINICAL DATA:  Back pain EXAM: LUMBAR SPINE - COMPLETE 4+ VIEW COMPARISON:  MRI lumbar spine done on 08/15/2020 FINDINGS: No recent fracture is seen. Alignment of posterior margins of the vertebral bodies is unremarkable. Degenerative changes are noted with disc space narrowing, bony spurs and facet hypertrophy throughout the lumbar spine. No significant interval changes are noted. IMPRESSION: No recent fracture is seen in the lumbar spine. Moderate to marked degenerative changes are noted with disc space narrowing, bony spurs and facet hypertrophy at multiple levels. Electronically Signed   By: Ernie Avena M.D.   On: 03/14/2022 08:11   (Echo, Carotid, EGD, Colonoscopy, ERCP)    Subjective: Pt c/o intermittent back pain    Discharge Exam: Vitals:   03/21/22 0810 03/21/22 1225  BP: 106/84 107/70  Pulse: 72 67  Resp: 18 18  Temp: 98.4 F (36.9 C) 98.3 F (36.8 C)  SpO2: 99% 97%   Vitals:   03/20/22 2323 03/21/22 0506 03/21/22 0810 03/21/22 1225  BP: 107/76 111/72 106/84 107/70  Pulse: 65 65 72 67  Resp: Temp: 97.9 F (36.6 C) (!) 97.5 F (36.4 C) 98.4 F (36.9 C) 98.3 F (36.8 C)  TempSrc: Oral Oral    SpO2: 100% 97% 99% 97%  Weight:      Height:        General: Pt is alert, awake, not in acute distress Cardiovascular:  S1/S2 +, no rubs, no gallops Respiratory: decreased breath sounds b/l  Abdominal: Soft, NT, obese, bowel sounds + Extremities: no cyanosis    The results of significant diagnostics from this hospitalization (including imaging, microbiology, ancillary and laboratory) are listed below for reference.     Microbiology:  Recent Results (from the past 240 hour(s))  Urine Culture     Status: Abnormal   Collection Time: 03/14/22  9:00 AM   Specimen: Urine, Clean Catch  Result Value Ref Range Status   Specimen Description   Final    URINE, CLEAN CATCH Performed at [facility], [address] [place], [place] [address]    Special Requests   Final    NONE Performed at [facility], [address] [place], [place] [address]    Culture >=100,000 COLONIES/mL ESCHERICHIA COLI (A)  Final   Report Status 03/17/2022 FINAL  Final   Organism ID, Bacteria ESCHERICHIA COLI (A)  Final      Susceptibility   Escherichia coli - MIC*    AMPICILLIN 4 SENSITIVE Sensitive     CEFAZOLIN <=4 SENSITIVE Sensitive     CEFEPIME <=0.12 SENSITIVE Sensitive     CEFTRIAXONE <=0.25 SENSITIVE Sensitive     CIPROFLOXACIN <=0.25 SENSITIVE Sensitive     GENTAMICIN <=1 SENSITIVE Sensitive     IMIPENEM <=0.25 SENSITIVE Sensitive     NITROFURANTOIN <=16 SENSITIVE Sensitive     TRIMETH/SULFA <=20 SENSITIVE Sensitive     AMPICILLIN/SULBACTAM <=2 SENSITIVE Sensitive     PIP/TAZO <=4 SENSITIVE Sensitive     * >=100,000 COLONIES/mL ESCHERICHIA COLI     Labs: BNP (last 3 results) Recent Labs    08/16/21 0110 01/27/22 0812 03/14/22 0551  BNP 167.8* 50.9 39.1   Basic Metabolic Panel: Recent Labs  Lab 03/15/22 0446 03/20/22 0957 03/21/22 0456  NA 138 138 139  K  4.4 3.9 4.0  CL 91* 94* 95*  CO2 41* 35* 37*  GLUCOSE 298* 183* 157*  BUN 26* 22 24*  CREATININE 1.22 1.04 1.05  CALCIUM 9.1 8.8* 9.0   Liver Function Tests: No results for input(s): "AST", "ALT", "ALKPHOS", "BILITOT", "PROT", "ALBUMIN" in the last 168 hours. No results for input(s): "LIPASE", "AMYLASE" in the last 168 hours. No results for input(s): "AMMONIA" in the last 168 hours. CBC: Recent Labs  Lab 03/15/22 0446 03/20/22 0957 03/21/22 0456  WBC 11.1* 9.4 7.9  HGB 12.7* 13.0 12.9*  HCT 40.8 41.0 41.5  MCV 93.4 91.7 93.7  PLT 260 256 237   Cardiac Enzymes: No results for input(s): "CKTOTAL", "CKMB", "CKMBINDEX", "TROPONINI" in the last 168 hours. BNP: Invalid input(s): "POCBNP" CBG: Recent Labs  Lab 03/20/22 1134 03/20/22 1511 03/20/22 2103 03/21/22 0733 03/21/22 1225  GLUCAP 257* 158* 207* 153* 157*   D-Dimer No results for input(s): "DDIMER" in the last 72 hours. Hgb A1c No results for input(s): "HGBA1C" in the last 72 hours. Lipid Profile No results for input(s): "CHOL", "HDL", "LDLCALC", "TRIG", "CHOLHDL", "LDLDIRECT" in the last 72 hours. Thyroid function studies No results for input(s): "TSH", "T4TOTAL", "T3FREE", "THYROIDAB" in the last 72 hours.  Invalid input(s): "FREET3" Anemia work up No results for input(s): "VITAMINB12", "FOLATE", "FERRITIN", "TIBC", "IRON", "RETICCTPCT" in the last 72 hours. Urinalysis    Component Value Date/Time   COLORURINE YELLOW (A) 03/14/2022 0640   APPEARANCEUR CLEAR (A) 03/14/2022 0640   APPEARANCEUR Clear 08/20/2013 0849   LABSPEC 1.010 03/14/2022 0640   LABSPEC 1.014 08/20/2013 0849   PHURINE 6.0 03/14/2022 0640   GLUCOSEU NEGATIVE 03/14/2022 0640   GLUCOSEU Negative 08/20/2013 0849   HGBUR NEGATIVE 03/14/2022 0640   BILIRUBINUR NEGATIVE 03/14/2022 0640   BILIRUBINUR Negative 08/20/2013 0849   KETONESUR NEGATIVE 03/14/2022 0640   PROTEINUR NEGATIVE 03/14/2022 0640   NITRITE NEGATIVE 03/14/2022 0640    LEUKOCYTESUR  MODERATE (A) 03/14/2022 0640   LEUKOCYTESUR Negative 08/20/2013 0849   Sepsis Labs Recent Labs  Lab 03/15/22 0446 03/20/22 0957 03/21/22 0456  WBC 11.1* 9.4 7.9   Microbiology Recent Results (from the past 240 hour(s))  Urine Culture     Status: Abnormal   Collection Time: 03/14/22  9:00 AM   Specimen: Urine, Clean Catch  Result Value Ref Range Status   Specimen Description   Final    URINE, CLEAN CATCH Performed at [facility], [address], [place], [place] [address]    Special Requests   Final    NONE Performed at [facility] [address] [place], [place] [address]    Culture >=100,000 COLONIES/mL ESCHERICHIA COLI (A)  Final   Report Status 03/17/2022 FINAL  Final   Organism ID, Bacteria ESCHERICHIA COLI (A)  Final      Susceptibility   Escherichia coli - MIC*    AMPICILLIN 4 SENSITIVE Sensitive     CEFAZOLIN <=4 SENSITIVE Sensitive     CEFEPIME <=0.12 SENSITIVE Sensitive     CEFTRIAXONE <=0.25 SENSITIVE Sensitive     CIPROFLOXACIN <=0.25 SENSITIVE Sensitive     GENTAMICIN <=1 SENSITIVE Sensitive     IMIPENEM <=0.25 SENSITIVE Sensitive     NITROFURANTOIN <=16 SENSITIVE Sensitive     TRIMETH/SULFA <=20 SENSITIVE Sensitive     AMPICILLIN/SULBACTAM <=2 SENSITIVE Sensitive     PIP/TAZO <=4 SENSITIVE Sensitive     * >=100,000 COLONIES/mL ESCHERICHIA COLI     Time coordinating discharge: Over 30 minutes  SIGNED:   Charise Killian, MD  [facility] 03/21/2022, 2:16 PM Pager   If 7PM-7AM, please contact night-coverage [email or web address]

## 2022-03-21 NOTE — Care Management Important Message (Signed)
Important Message  Patient Details  Name: Cody Alexander MRN: [id number] Date of Birth: 10-07-1955   Medicare Important Message Given:  Yes     [name] 03/21/2022, 11:02 AM

## 2022-03-21 NOTE — TOC Progression Note (Addendum)
Transition of Care [facility]) - Progression Note    Patient Details  Name: Cody Alexander MRN: [id number] Date of Birth: October 21, 1955  [facility] CM/SW Contact  Truddie Hidden, RN Phone Number: 03/21/2022, 10:40 AM  Clinical Narrative:    Notified by MD patient refused SNF.   Spoke with patient regarding discharge. Patient stated he needed to get back home to help care for the 65 year old friend he was living with and assisting prior to this admission due to her cost of $100 per day personal care. Patient stated he did not have a ride home. CM to arrange transportation.   Patient is agreeable to  [facility]. He stated he was "Cut loose" prior to this admission. Referral sent and accepted by Feliberto Gottron of [facility].    Call placed to Lloyd Huger, Admissions Director at [facility]. Updated Tammy about change in discharge plan.  Contacted Zack Blank of [employer] to order BIPAP. BIPAP previously ordered. Waiting on details of delivery scheduling and approval.      Expected Discharge Plan: Skilled Nursing Facility Barriers to Discharge: Continued Medical Work up  Expected Discharge Plan and Services Expected Discharge Plan: Skilled Nursing Facility     Post Acute Care Choice: Skilled Nursing Facility Living arrangements for the past 2 months: Single Family Home                                       Social Determinants of Health (SDOH) Interventions    Readmission Risk Interventions     No data to display

## 2022-03-21 NOTE — TOC Transition Note (Signed)
Transition of Care [facility]) - CM/SW Discharge Note   Patient Details  Name: Cody Alexander MRN: [id number] Date of Birth: 20-Sep-1955  Transition of Care [facility]) CM/SW Contact:  Truddie Hidden, RN Phone Number: 03/21/2022, 2:24 PM   Clinical Narrative:    Cody Alexander with [name] from [employer]. Patient BIPAP has been ordered and scheduled for delivery.  Spoke with patient. He has located his own transportation pending he can be discharged within the next 20 minutes. MD and nurse notified. TOC signing off.      Barriers to Discharge: Continued Medical Work up   Patient Goals and CMS Choice        Discharge Placement                       Discharge Plan and Services     Post Acute Care Choice: Skilled Nursing Facility                               Social Determinants of Health (SDOH) Interventions     Readmission Risk Interventions     No data to display

## 2022-03-21 NOTE — Progress Notes (Signed)
Occupational Therapy Treatment Patient Details Name: Cody Alexander MRN: [id number] DOB: 10-31-1955 Today's Date: 03/21/2022   History of present illness Pt is a 66 y/o M admitted on 03/14/22 after presenting to the ED with c/o worsening back pain over the last 5 days. Pt reports he fell down the stairs & landed on his R buttock & has had worsening LBP, BLE swelling, & inability to walk. Pt endorses multiple falls over the last 5 days. Pt is being treated for acute on chronic respiratory failure & acute exacerbation of LBP. MRI with results of disc protrusion.  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 sitting up in the chair upon arrival without O2 on. Pt. reports having eaten breakfast this morning without it on. SO2 94%,  HR 91 bpms on 4L O2. Pt. reports 8/10 LBP, and left sided neck pain. Pt. education was provided about energy conservation, work simplification techniques for ADLs, and IADLs, and PLB techniques. A visual handout was provided to the pt. Pt. continues to benefit from OT services for ADL training, A/E training, and pt. education about energy conservation/work simplification techniques, home modification, and DME. Pt. would benefit from follow-up OT services at discharge.   Recommendations for follow up therapy are one component of a multi-disciplinary discharge planning process, led by the attending physician.  Recommendations may be updated based on patient status, additional functional criteria and insurance authorization.    Follow Up Recommendations  Skilled nursing-short term rehab (<3 hours/day)    Assistance Recommended at Discharge PRN  Patient can return home with the following  A little help with walking and/or transfers;A little help with bathing/dressing/bathroom;Help with stairs or ramp for entrance   Equipment Recommendations       Recommendations for Other  Services      Precautions / Restrictions Precautions Precautions: Fall Precaution Comments: back precautions for comfort Restrictions Weight Bearing Restrictions: No       Mobility Bed Mobility    Pt. Sitting up in chair                Transfers    Sit to stand: Independent  Transfers: Independent                     Unsupported standing balance: Fair  Balance                                           ADL either performed or assessed with clinical judgement   ADL  Pt. education was provided about energy conservation/work simplification techniques for ADLs, and IADLs. Reviewed A/E use for LE ADLs.                                             Extremity/Trunk Assessment Upper Extremity Assessment Upper Extremity Assessment: Overall WFL for tasks assessed            Vision       Perception     Praxis      Cognition Arousal/Alertness: Awake/alert Behavior During Therapy: WFL for tasks assessed/performed Overall Cognitive Status: Within Functional Limits for tasks assessed  Exercises      Shoulder Instructions       General Comments      Pertinent Vitals/ Pain       Pain Assessment Pain Assessment: 0-10 Pain Score: 8 LBP, and left sided neck Pain Descriptors / Indicators: Grimacing, Discomfort, Aching Pain Intervention(s): Limited activity within patient's tolerance, Monitored during session, Premedicated before session  Home Living                                          Prior Functioning/Environment              Frequency  Min 2X/week        Progress Toward Goals  OT Goals(current goals can now be found in the care plan section)  Progress towards OT goals: Progressing toward goals  Acute Rehab OT Goals Patient Stated Goal: To return home OT Goal Formulation: With patient Time For Goal Achievement:  03/29/22 Potential to Achieve Goals: Fair  Plan Discharge plan remains appropriate;Frequency remains appropriate    Co-evaluation                 AM-PAC OT "6 Clicks" Daily Activity     Outcome Measure   Help from another person eating meals?: None Help from another person taking care of personal grooming?: None Help from another person toileting, which includes using toliet, bedpan, or urinal?: None Help from another person bathing (including washing, rinsing, drying)?: A Little Help from another person to put on and taking off regular upper body clothing?: None Help from another person to put on and taking off regular lower body clothing?: A Little 6 Click Score: 22    End of Session Equipment Utilized During Treatment: Oxygen  OT Visit Diagnosis: Unsteadiness on feet (R26.81);Repeated falls (R29.6);Muscle weakness (generalized) (M62.81);History of falling (Z91.81)   Activity Tolerance Patient limited by pain;Patient tolerated treatment well   Patient Left in chair   Nurse Communication Mobility status        Time: 3825-0539 OT Time Calculation (min): 15 min  Charges: OT General Charges $OT Visit: 1 Visit OT Treatments $Self Care/Home Management : 8-22 mins   Olegario Messier, MS, OTR/L   Olegario Messier 03/21/2022, 9:49 AM

## 2022-04-08 ENCOUNTER — Institutional Professional Consult (permissible substitution): Payer: Medicare Other | Admitting: Primary Care

## 2022-06-19 ENCOUNTER — Inpatient Hospital Stay
Admission: EM | Admit: 2022-06-19 | Discharge: 2022-06-25 | DRG: 312 | Disposition: A | Discharge status: Home Health | Payer: Medicare Other | Attending: Hospitalist | Admitting: Hospitalist

## 2022-06-19 ENCOUNTER — Encounter: Payer: Self-pay | Admitting: Medical Oncology

## 2022-06-19 ENCOUNTER — Emergency Department: Payer: Medicare Other

## 2022-06-19 DIAGNOSIS — I5032 Chronic diastolic (congestive) heart failure: Secondary | ICD-10-CM | POA: Diagnosis present

## 2022-06-19 DIAGNOSIS — G4733 Obstructive sleep apnea (adult) (pediatric): Secondary | ICD-10-CM | POA: Diagnosis present

## 2022-06-19 DIAGNOSIS — B9620 Unspecified Escherichia coli [E. coli] as the cause of diseases classified elsewhere: Secondary | ICD-10-CM | POA: Diagnosis present

## 2022-06-19 DIAGNOSIS — Z9981 Dependence on supplemental oxygen: Secondary | ICD-10-CM | POA: Diagnosis not present

## 2022-06-19 DIAGNOSIS — I10 Essential (primary) hypertension: Secondary | ICD-10-CM | POA: Diagnosis present

## 2022-06-19 DIAGNOSIS — R29898 Other symptoms and signs involving the musculoskeletal system: Secondary | ICD-10-CM | POA: Diagnosis present

## 2022-06-19 DIAGNOSIS — Z8249 Family history of ischemic heart disease and other diseases of the circulatory system: Secondary | ICD-10-CM

## 2022-06-19 DIAGNOSIS — J440 Chronic obstructive pulmonary disease with acute lower respiratory infection: Secondary | ICD-10-CM | POA: Diagnosis present

## 2022-06-19 DIAGNOSIS — I110 Hypertensive heart disease with heart failure: Secondary | ICD-10-CM | POA: Diagnosis present

## 2022-06-19 DIAGNOSIS — I5033 Acute on chronic diastolic (congestive) heart failure: Secondary | ICD-10-CM | POA: Diagnosis present

## 2022-06-19 DIAGNOSIS — Z794 Long term (current) use of insulin: Secondary | ICD-10-CM

## 2022-06-19 DIAGNOSIS — E1140 Type 2 diabetes mellitus with diabetic neuropathy, unspecified: Secondary | ICD-10-CM | POA: Diagnosis present

## 2022-06-19 DIAGNOSIS — M549 Dorsalgia, unspecified: Secondary | ICD-10-CM | POA: Diagnosis present

## 2022-06-19 DIAGNOSIS — Z6841 Body Mass Index (BMI) 40.0 and over, adult: Secondary | ICD-10-CM

## 2022-06-19 DIAGNOSIS — N390 Urinary tract infection, site not specified: Secondary | ICD-10-CM | POA: Diagnosis present

## 2022-06-19 DIAGNOSIS — I2510 Atherosclerotic heart disease of native coronary artery without angina pectoris: Secondary | ICD-10-CM | POA: Diagnosis present

## 2022-06-19 DIAGNOSIS — R531 Weakness: Secondary | ICD-10-CM

## 2022-06-19 DIAGNOSIS — J441 Chronic obstructive pulmonary disease with (acute) exacerbation: Secondary | ICD-10-CM | POA: Diagnosis present

## 2022-06-19 DIAGNOSIS — J9611 Chronic respiratory failure with hypoxia: Secondary | ICD-10-CM | POA: Diagnosis present

## 2022-06-19 DIAGNOSIS — Z881 Allergy status to other antibiotic agents status: Secondary | ICD-10-CM | POA: Diagnosis not present

## 2022-06-19 DIAGNOSIS — M25562 Pain in left knee: Secondary | ICD-10-CM | POA: Diagnosis present

## 2022-06-19 DIAGNOSIS — R7989 Other specified abnormal findings of blood chemistry: Secondary | ICD-10-CM | POA: Diagnosis present

## 2022-06-19 DIAGNOSIS — R55 Syncope and collapse: Principal | ICD-10-CM

## 2022-06-19 DIAGNOSIS — Z7951 Long term (current) use of inhaled steroids: Secondary | ICD-10-CM

## 2022-06-19 DIAGNOSIS — E66813 Obesity, class 3: Secondary | ICD-10-CM | POA: Diagnosis present

## 2022-06-19 DIAGNOSIS — J9612 Chronic respiratory failure with hypercapnia: Secondary | ICD-10-CM | POA: Diagnosis present

## 2022-06-19 DIAGNOSIS — M25561 Pain in right knee: Secondary | ICD-10-CM | POA: Diagnosis present

## 2022-06-19 DIAGNOSIS — N3000 Acute cystitis without hematuria: Secondary | ICD-10-CM | POA: Diagnosis present

## 2022-06-19 DIAGNOSIS — Z1152 Encounter for screening for COVID-19: Secondary | ICD-10-CM

## 2022-06-19 DIAGNOSIS — Z79899 Other long term (current) drug therapy: Secondary | ICD-10-CM

## 2022-06-19 DIAGNOSIS — G8929 Other chronic pain: Secondary | ICD-10-CM | POA: Diagnosis present

## 2022-06-19 DIAGNOSIS — E1165 Type 2 diabetes mellitus with hyperglycemia: Secondary | ICD-10-CM | POA: Diagnosis present

## 2022-06-19 DIAGNOSIS — J209 Acute bronchitis, unspecified: Secondary | ICD-10-CM | POA: Diagnosis present

## 2022-06-19 DIAGNOSIS — J449 Chronic obstructive pulmonary disease, unspecified: Secondary | ICD-10-CM

## 2022-06-19 DIAGNOSIS — R079 Chest pain, unspecified: Secondary | ICD-10-CM

## 2022-06-19 DIAGNOSIS — Z9181 History of falling: Secondary | ICD-10-CM

## 2022-06-19 DIAGNOSIS — Z7984 Long term (current) use of oral hypoglycemic drugs: Secondary | ICD-10-CM

## 2022-06-19 LAB — URINALYSIS, ROUTINE W REFLEX MICROSCOPIC
Bilirubin Urine: NEGATIVE
Glucose, UA: NEGATIVE mg/dL
Ketones, ur: NEGATIVE mg/dL
Nitrite: NEGATIVE
Protein, ur: >=300 mg/dL — AB
Specific Gravity, Urine: 1.014 (ref 1.005–1.030)
WBC, UA: >50 WBC/hpf — ABNORMAL HIGH (ref 0–5)
pH: 5 (ref 5.0–8.0)

## 2022-06-19 LAB — RESP PANEL BY RT-PCR (FLU A&B, COVID) ARPGX2
Influenza A by PCR: NEGATIVE
Influenza B by PCR: NEGATIVE
SARS Coronavirus 2 by RT PCR: NEGATIVE

## 2022-06-19 LAB — BASIC METABOLIC PANEL
Anion gap: 11 (ref 5–15)
BUN: 20 mg/dL (ref 8–23)
CO2: 33 mmol/L — ABNORMAL HIGH (ref 22–32)
Calcium: 8.8 mg/dL — ABNORMAL LOW (ref 8.9–10.3)
Chloride: 91 mmol/L — ABNORMAL LOW (ref 98–111)
Creatinine, Ser: 1.17 mg/dL (ref 0.61–1.24)
GFR, Estimated: >60 mL/min (ref >60)
Glucose, Bld: 221 mg/dL — ABNORMAL HIGH (ref 70–99)
Potassium: 3.7 mmol/L (ref 3.5–5.1)
Sodium: 135 mmol/L (ref 135–145)

## 2022-06-19 LAB — MAGNESIUM: Magnesium: 1.7 mg/dL (ref 1.7–2.4)

## 2022-06-19 LAB — CBC
HCT: 45.1 % (ref 39.0–52.0)
Hemoglobin: 14.3 g/dL (ref 13.0–17.0)
MCH: 29.2 pg (ref 26.0–34.0)
MCHC: 31.7 g/dL (ref 30.0–36.0)
MCV: 92 fL (ref 80.0–100.0)
Platelets: 255 10*3/uL (ref 150–400)
RBC: 4.9 MIL/uL (ref 4.22–5.81)
RDW: 13.1 % (ref 11.5–15.5)
WBC: 21.4 10*3/uL — ABNORMAL HIGH (ref 4.0–10.5)
nRBC: 0 % (ref 0.0–0.2)

## 2022-06-19 LAB — TROPONIN I (HIGH SENSITIVITY): Troponin I (High Sensitivity): 95 ng/L — ABNORMAL HIGH (ref <18)

## 2022-06-19 MED ORDER — ONDANSETRON HCL 4 MG/2ML IJ SOLN
4.0000 mg | Freq: Four times a day (QID) | INTRAMUSCULAR | Status: DC | PRN
  Administered 2022-06-21 (×2): 4 mg via INTRAVENOUS
  Filled 2022-06-19 (×2): qty 2

## 2022-06-19 MED ORDER — TRAMADOL HCL 50 MG PO TABS
50.0000 mg | ORAL_TABLET | Freq: Two times a day (BID) | ORAL | Status: DC | PRN
  Filled 2022-06-19: qty 1

## 2022-06-19 MED ORDER — SODIUM CHLORIDE 0.9 % IV SOLN
1.0000 g | Freq: Once | INTRAVENOUS | Status: AC
  Administered 2022-06-19: 1 g via INTRAVENOUS
  Filled 2022-06-19: qty 10

## 2022-06-19 MED ORDER — ACETAMINOPHEN 650 MG RE SUPP: 650.0000 mg | Freq: Four times a day (QID) | RECTAL | Status: DC | PRN

## 2022-06-19 MED ORDER — ALBUTEROL SULFATE (2.5 MG/3ML) 0.083% IN NEBU
2.5000 mg | INHALATION_SOLUTION | RESPIRATORY_TRACT | Status: DC | PRN
  Administered 2022-06-23: 2.5 mg via RESPIRATORY_TRACT
  Filled 2022-06-19: qty 3

## 2022-06-19 MED ORDER — PREDNISONE 20 MG PO TABS
60.0000 mg | ORAL_TABLET | Freq: Once | ORAL | Status: AC
  Administered 2022-06-19: 60 mg via ORAL
  Filled 2022-06-19: qty 3

## 2022-06-19 MED ORDER — SODIUM CHLORIDE 0.9 % IV SOLN
1.0000 g | INTRAVENOUS | Status: DC
  Administered 2022-06-20 – 2022-06-21 (×2): 1 g via INTRAVENOUS
  Filled 2022-06-19: qty 1
  Filled 2022-06-19: qty 10

## 2022-06-19 MED ORDER — ACETAMINOPHEN 325 MG PO TABS
650.0000 mg | ORAL_TABLET | Freq: Four times a day (QID) | ORAL | Status: DC | PRN
  Administered 2022-06-20 – 2022-06-24 (×4): 650 mg via ORAL
  Filled 2022-06-19 (×4): qty 2

## 2022-06-19 MED ORDER — INSULIN ASPART 100 UNIT/ML IJ SOLN
0.0000 [IU] | Freq: Three times a day (TID) | INTRAMUSCULAR | Status: DC
  Administered 2022-06-20: 4 [IU] via SUBCUTANEOUS
  Administered 2022-06-20: 11 [IU] via SUBCUTANEOUS
  Administered 2022-06-20: 15 [IU] via SUBCUTANEOUS
  Administered 2022-06-21: 4 [IU] via SUBCUTANEOUS
  Administered 2022-06-21: 7 [IU] via SUBCUTANEOUS
  Administered 2022-06-22: 4 [IU] via SUBCUTANEOUS
  Administered 2022-06-22: 11 [IU] via SUBCUTANEOUS
  Administered 2022-06-22: 7 [IU] via SUBCUTANEOUS
  Administered 2022-06-23: 4 [IU] via SUBCUTANEOUS
  Administered 2022-06-23: 11 [IU] via SUBCUTANEOUS
  Administered 2022-06-24 (×2): 7 [IU] via SUBCUTANEOUS
  Filled 2022-06-19 (×12): qty 1

## 2022-06-19 MED ORDER — IPRATROPIUM-ALBUTEROL 0.5-2.5 (3) MG/3ML IN SOLN
3.0000 mL | Freq: Four times a day (QID) | RESPIRATORY_TRACT | Status: DC
  Administered 2022-06-20 – 2022-06-22 (×8): 3 mL via RESPIRATORY_TRACT
  Filled 2022-06-19 (×9): qty 3

## 2022-06-19 MED ORDER — INSULIN ASPART 100 UNIT/ML IJ SOLN
0.0000 [IU] | Freq: Every day | INTRAMUSCULAR | Status: DC
  Administered 2022-06-22: 4 [IU] via SUBCUTANEOUS
  Administered 2022-06-23: 2 [IU] via SUBCUTANEOUS
  Filled 2022-06-19 (×2): qty 1

## 2022-06-19 MED ORDER — GUAIFENESIN-DM 100-10 MG/5ML PO SYRP
15.0000 mL | ORAL_SOLUTION | ORAL | Status: DC | PRN
  Administered 2022-06-20 (×3): 15 mL via ORAL
  Filled 2022-06-19 (×5): qty 20

## 2022-06-19 MED ORDER — LISINOPRIL 20 MG PO TABS
20.0000 mg | ORAL_TABLET | Freq: Every day | ORAL | Status: DC
  Administered 2022-06-20 – 2022-06-21 (×2): 20 mg via ORAL
  Filled 2022-06-19 (×2): qty 1

## 2022-06-19 MED ORDER — PREDNISONE 20 MG PO TABS: 40.0000 mg | ORAL_TABLET | Freq: Every day | ORAL | Status: DC

## 2022-06-19 MED ORDER — ONDANSETRON HCL 4 MG PO TABS: 4.0000 mg | ORAL_TABLET | Freq: Four times a day (QID) | ORAL | Status: DC | PRN

## 2022-06-19 MED ORDER — IPRATROPIUM-ALBUTEROL 0.5-2.5 (3) MG/3ML IN SOLN
3.0000 mL | Freq: Once | RESPIRATORY_TRACT | Status: AC
  Administered 2022-06-19: 3 mL via RESPIRATORY_TRACT
  Filled 2022-06-19: qty 3

## 2022-06-19 MED ORDER — ENOXAPARIN SODIUM 80 MG/0.8ML IJ SOSY
0.5000 mg/kg | PREFILLED_SYRINGE | INTRAMUSCULAR | Status: DC
  Administered 2022-06-21: 67.5 mg via SUBCUTANEOUS
  Filled 2022-06-19: qty 0.68
  Filled 2022-06-19: qty 0.8
  Filled 2022-06-19 (×3): qty 0.68

## 2022-06-19 MED ORDER — METOPROLOL TARTRATE 25 MG PO TABS: 25.0000 mg | ORAL_TABLET | Freq: Two times a day (BID) | ORAL | Status: DC

## 2022-06-19 MED ORDER — IOHEXOL 350 MG/ML SOLN
75.0000 mL | Freq: Once | INTRAVENOUS | Status: AC | PRN
  Administered 2022-06-19: 75 mL via INTRAVENOUS

## 2022-06-19 MED ORDER — INSULIN DETEMIR 100 UNIT/ML ~~LOC~~ SOLN: 20.0000 [IU] | Freq: Every day | SUBCUTANEOUS | Status: DC

## 2022-06-19 NOTE — Assessment & Plan Note (Signed)
Complicating factor to overall prognosis and care 

## 2022-06-19 NOTE — Assessment & Plan Note (Addendum)
Hold amlodipine, lisinopril, metoprolol, Lasix and aldactone due to orthostasis

## 2022-06-19 NOTE — ED Provider Notes (Signed)
[facility] Provider Note    Event Date/Time   First MD Initiated Contact with Patient 06/19/22 2026     (approximate)   History   Chief Complaint Loss of Consciousness and Chest Pain   HPI  Cody Alexander is a 66 y.o. male with past medical history of hypertension, diabetes, CAD, COPD, diastolic CHF, and chronic hypoxic respiratory failure on 4 L nasal cannula who presents to the ED complaining of syncope.  Patient reports that he has been dealing with 2 to 3 days of cough productive of yellowish sputum with some chest congestion and mild difficulty breathing.  This is also been associated with intermittent sharp stabbing pain in the center of his chest, worse when he goes to cough.  He denies any associated fevers, does report some swelling in both of his legs but denies associated pain.  He went to get up to try to walk to the bathroom this morning at home, states that he syncopized and fell backwards, striking his head.  He believes he was unconscious for a couple of minutes, now complains of headache and neck pain.  He does not take any blood thinners.  He also endorses recent dysuria, denies abdominal pain, nausea, vomiting, fever, or flank pain.     Physical Exam   Triage Vital Signs: ED Triage Vitals  Enc Vitals Group     BP 06/19/22 2033 102/67     Pulse Rate 06/19/22 1705 (!) 112     Resp 06/19/22 1705 20     Temp 06/19/22 1705 98 F (36.7 C)     Temp Source 06/19/22 1705 Oral     SpO2 06/19/22 1705 94 %     Weight 06/19/22 1706 300 lb (136.1 kg)     Height 06/19/22 1706 6\' 1"  (1.854 m)     Head Circumference --      Peak Flow --      Pain Score 06/19/22 1706 7     Pain Loc --      Pain Edu? --      Excl. in GC? --     Most recent vital signs: Vitals:   06/19/22 2033 06/19/22 2218  BP: 102/67 114/80  Pulse: 84 89  Resp: 16 18  Temp: 98.1 F (36.7 C) 98 F (36.7 C)  SpO2: 99% 99%    Constitutional: Alert and oriented. Eyes:  Conjunctivae are normal. Head: Atraumatic. Nose: No congestion/rhinnorhea. Mouth/Throat: Mucous membranes are moist.  Cardiovascular: Normal rate, regular rhythm. Grossly normal heart sounds.  2+ radial pulses bilaterally. Respiratory: Normal respiratory effort.  No retractions. Lungs with expiratory wheezing bilaterally. Gastrointestinal: Soft and nontender. No distention. Musculoskeletal: No lower extremity tenderness nor edema.  Neurologic:  Normal speech and language. No gross focal neurologic deficits are appreciated.    ED Results / Procedures / Treatments   Labs (all labs ordered are listed, but only abnormal results are displayed) Labs Reviewed  BASIC METABOLIC PANEL - Abnormal; Notable for the following components:      Result Value   Chloride 91 (*)    CO2 33 (*)    Glucose, Bld 221 (*)    Calcium 8.8 (*)    All other components within normal limits  CBC - Abnormal; Notable for the following components:   WBC 21.4 (*)    All other components within normal limits  URINALYSIS, ROUTINE W REFLEX MICROSCOPIC - Abnormal; Notable for the following components:   Color, Urine AMBER (*)    APPearance  CLOUDY (*)    Hgb urine dipstick MODERATE (*)    Protein, ur >=300 (*)    Leukocytes,Ua LARGE (*)    WBC, UA >50 (*)    Bacteria, UA MANY (*)    All other components within normal limits  TROPONIN I (HIGH SENSITIVITY) - Abnormal; Notable for the following components:   Troponin I (High Sensitivity) 95 (*)    All other components within normal limits  RESP PANEL BY RT-PCR (FLU A&B, COVID) [id number]  URINE CULTURE  MAGNESIUM     EKG  ED ECG REPORT I, Chesley Noon, the attending physician, personally viewed and interpreted this ECG.   Date: 06/19/2022  EKG Time: 20:27  Rate: 87  Rhythm: normal sinus rhythm  Axis: Normal  Intervals: Prolonged QT  ST&T Change: T wave inversions laterally  RADIOLOGY CT head reviewed and interpreted by me with no hemorrhage or midline  shift.  CTA chest reviewed and interpreted by me with no PE or focal infiltrate.  PROCEDURES:  Critical Care performed: No  Procedures   MEDICATIONS ORDERED IN ED: Medications  cefTRIAXone (ROCEPHIN) 1 g in sodium chloride 0.9 % 100 mL IVPB (1 g Intravenous New Bag/Given 06/19/22 2221)  iohexol (OMNIPAQUE) 350 MG/ML injection 75 mL (75 mLs Intravenous Contrast Given 06/19/22 2129)  predniSONE (DELTASONE) tablet 60 mg (60 mg Oral Given 06/19/22 2229)  ipratropium-albuterol (DUONEB) 0.5-2.5 (3) MG/3ML nebulizer solution 3 mL (3 mLs Nebulization Given 06/19/22 2229)     IMPRESSION / MDM / ASSESSMENT AND PLAN / ED COURSE  I reviewed the triage vital signs and the nursing notes.                              66 y.o. male with past medical history of hypertension, diabetes, CAD, diastolic CHF, COPD, and chronic hypoxic respiratory failure on 4 L nasal cannula who presents to the ED complaining of 2 to 3 days of sharp chest pain and cough with some mild difficulty breathing, now with syncopal episode earlier today.  Patient's presentation is most consistent with acute presentation with potential threat to life or bodily function.  Differential diagnosis includes, but is not limited to, ACS, PE, pneumonia, COPD exacerbation, COVID-19, influenza, AKI, electrolyte abnormality, dehydration, vasovagal episode, orthostatic hypotension.  Patient nontoxic-appearing and in no acute distress, vital signs are unremarkable.  He is not in any respiratory distress and maintaining oxygen saturations on his usual 4 L nasal cannula.  He does have some faint expiratory wheezing throughout which we will treat with dose of prednisone and DuoNeb.  With his chest pain and syncope, CTA chest was performed and negative for PE or focal pneumonia.  EKG shows no evidence of arrhythmia or ischemia, however patient does have elevation in troponin.  This appears stable compared to previous levels and we will trend, overall  low suspicion for ACS at this time.  Remainder of labs remarkable for significant leukocytosis, however vital signs do not appear concerning for sepsis.  No significant AKI or electrolyte abnormality noted.  Patient does appear to have a UTI, states that he continues to feel significantly weak and does not think he can walk on his own.  Given UTI, severe weakness, syncopal episode, and mild COPD exacerbation, patient would benefit from admission to the hospitalist service.  Case discussed with hospitalist for admission.      FINAL CLINICAL IMPRESSION(S) / ED DIAGNOSES   Final diagnoses:  Syncope, unspecified syncope type  Acute cystitis without hematuria  Generalized weakness  COPD exacerbation ([place])     Rx / DC Orders   ED Discharge Orders     None        Note:  This document was prepared using Dragon voice recognition software and may include unintentional dictation errors.   Blake Divine, MD 06/19/22 2239

## 2022-06-19 NOTE — Assessment & Plan Note (Addendum)
Chronic Lower extremity edema, otherwise mostly euvolemic --home lisinopril, Imdur, furosemide and spironolactone held on presentation due to orthostasis. Plan: --cont home metop  --resume home Lasix and aldactone today

## 2022-06-19 NOTE — Assessment & Plan Note (Addendum)
Blood sugar 211 in the emergency room Basal insulin Sliding scale insulin coverage  Follow A1c

## 2022-06-19 NOTE — Assessment & Plan Note (Addendum)
--  reported some burning with voiding.  Urine cx pos for pan-sensitive E coli. --completed 3 days of ceftriaxone

## 2022-06-19 NOTE — ED Triage Notes (Signed)
Pt reports he has been having central chest stabbing pain for a few days. States today he had a syncopal episode while at home and fell to the ground. Reports hitting back of head. No use of blood thinners.

## 2022-06-19 NOTE — Assessment & Plan Note (Signed)
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

## 2022-06-19 NOTE — Assessment & Plan Note (Addendum)
Pt refused bipap QHS

## 2022-06-19 NOTE — Assessment & Plan Note (Addendum)
Chest pain, atypical Elevated troponin chronic, 90's flat. CTA chest negative for PE Patient had left heart cath July 2019 that showed mild CAD  --repeat Echo no acute finding. --Continue aspirin, atorvastatin

## 2022-06-19 NOTE — ED Notes (Signed)
Pt to rm 25 via wc. Pt assisted to bed, changed into a hospital gown, and placed on the cardiac monitor. Warm blanket and urinal given to pt. Call light within reach. Pt c/o "feels like something is stabbing me in the chest" while clinching his chest with his fist. Repeat EKG preformed at 2027. Pt has no further needs at this time. [name], RN made aware that pt is in rm.

## 2022-06-19 NOTE — Assessment & Plan Note (Addendum)
Chronic respiratory failure with hypoxia and hypercapnia on 4L O2 baseline VBG with normal pH and PCO2 of 80, baseline appears to be in the 70s CTA chest without evidence of pneumonia or edema or PE.  Pt likely has URI with bad cough.  RVP neg. Plan: --resume prednisone 40 mg daily for likely brondhitis --Schedule and as needed nebulized bronchodilator treatment

## 2022-06-19 NOTE — H&P (Incomplete)
History and Physical    Patient: Cody Alexander OFH:[id number] DOB: September 12, 1955 DOA: 06/19/2022 DOS: the patient was seen and examined on 06/19/2022 PCP: Gildardo Pounds, PA  Patient coming from: Home  Chief Complaint:  Chief Complaint  Patient presents with   Loss of Consciousness   Chest Pain    HPI: Cody Alexander is a 66 y.o. male with medical history significant for COPD on home O2 at 4 L, obesity, OSA, HTN, mild CAD on cardiac cath 02/2018, diastolic heart failure, last EF 60-65% 07/2021 morbid obesity, COPD with chronic respiratory failure on 4 L of oxygen, OSA, insulin-dependent diabetes mellitus, hypertension, chronic diastolic dysfunction CHF (EF of 60 - 65% in December 2022), chronic back pain, who presents to the ED following a syncopal event that occurred as he stood up to walk to the bathroom.  He fell backwards, striking his head on hardwood floor and was out for couple of minutes.  He denied preceding chest pain, shortness of breath or palpitations.  Patient has had 3-day history of cough productive of yellow sputum associated with wheezing and stabbing chest pain when coughing.  He denies fever or chills.  He has lower extremity edema bilaterally but not much more than baseline.  Denies orthopnea.   ED course and data review: On arrival afebrile, BP 102/67 with pulse 112, O2 sat 94% on home O2 at 4 L.  Labs with WBC of 21,000, glucose 221, bicarb 33.  Troponin 95, COVID and flu negative.  Urinalysis with large leukocyte esterase, WBC over 50 and many bacteria. EKG, personally viewed and interpreted showing sinus at 87 with nonspecific ST-T wave changes. CT head and C-spine nonacute CTA chest showing the following: IMPRESSION: 1. No evidence of pulmonary embolism. 2. Main pulmonary trunk is dilated measuring up to 3.8 cm concerning for pulmonary arterial hypertension. 3. No evidence of pneumonia or pulmonary edema. 4. 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 No  acute osseous abnormality. 5. Advanced fatty infiltration of the pancreas.  Patient was treated with DuoNebs and prednisone, started on ceftriaxone and hospitalist consulted for admission.   Review of Systems: As mentioned in the history of present illness. All other systems reviewed and are negative.  Past Medical History:  Diagnosis Date   CHF (congestive heart failure) (HCC)    COPD (chronic obstructive pulmonary disease) (HCC)    Diabetes mellitus without complication (HCC)    Hypertension    Neuropathy    Past Surgical History:  Procedure Laterality Date   LEFT HEART CATH AND CORONARY ANGIOGRAPHY Right 02/27/2018   Procedure: LEFT HEART CATH AND CORONARY ANGIOGRAPHY;  Surgeon: Laurier Nancy, MD;  Location: [facility] INVASIVE CV LAB;  Service: Cardiovascular;  Laterality: Right;   Social History:  reports that he has never smoked. He has never used smokeless tobacco. He reports that he does not drink alcohol and does not use drugs.  Allergies  Allergen Reactions   Erythromycin Anaphylaxis and Swelling    Had eye swelling with erythromycin during a time when he had perf ear drum.  Tolerated azithromycin.    Family History  Problem Relation Age of Onset   Stroke Mother    Hypertension Father     Prior to Admission medications   Medication Sig Start Date End Date Taking? Authorizing Provider  furosemide (LASIX) 80 MG tablet Take 80 mg by mouth 2 (two) times daily.   Yes [provider]  gabapentin (NEURONTIN) 600 MG tablet Take 600 mg by mouth 3 (three) times daily.  04/18/22  Yes [provider]  hydrOXYzine (ATARAX) 10 MG tablet Take 10 mg by mouth 3 (three) times daily. 05/10/22  Yes [provider]  insulin detemir (LEVEMIR) 100 UNIT/ML FlexPen Inject 50 Units into the skin in the morning and at bedtime.   Yes [provider]  insulin lispro (HUMALOG) 100 UNIT/ML KwikPen Inject 16 Units into the skin 3 (three) times daily with meals.   Yes  [provider]  lisinopril (ZESTRIL) 20 MG tablet Take 20 mg by mouth daily.   Yes [provider]  terbinafine (LAMISIL) 250 MG tablet Take 250 mg by mouth daily. 05/10/22  Yes [provider]  acetaminophen (TYLENOL) 500 MG tablet Take 1,000 mg by mouth every 6 (six) hours as needed for mild pain or moderate pain.    [provider]  amLODipine (NORVASC) 10 MG tablet Take 10 mg by mouth daily. Patient not taking: Reported on 06/19/2022    [provider]  atorvastatin (LIPITOR) 40 MG tablet Take 40 mg by mouth daily. Patient not taking: Reported on 06/19/2022    [provider]  budesonide-formoterol (SYMBICORT) 160-4.5 MCG/ACT inhaler Inhale 2 puffs into the lungs 2 (two) times daily. Patient not taking: Reported on 06/19/2022    [provider]  isosorbide dinitrate (ISORDIL) 30 MG tablet Take 30 mg by mouth every morning. Patient not taking: Reported on 06/19/2022 11/14/20   [provider]  metFORMIN (GLUCOPHAGE) 1000 MG tablet Take 1,000 mg by mouth 2 (two) times daily. Patient not taking: Reported on 06/19/2022 06/02/20   [provider]  metoprolol tartrate (LOPRESSOR) 25 MG tablet Take 25 mg by mouth 2 (two) times daily. Patient not taking: Reported on 06/19/2022    [provider]  PROAIR HFA 108 [phone](90 Base) MCG/ACT inhaler Inhale 2 puffs into the lungs every 4 (four) hours as needed for wheezing. 08/18/21   Alford HighlandWieting, Richard, MD  spironolactone (ALDACTONE) 25 MG tablet Take 12.5 mg by mouth daily. Patient not taking: Reported on 06/19/2022    [provider]  traMADol (ULTRAM) 50 MG tablet Take 50 mg by mouth 2 (two) times daily as needed. 05/10/22   [provider]    Physical Exam: Vitals:   06/19/22 1705 06/19/22 1706 06/19/22 2033 06/19/22 2218  BP:   102/67 114/80  Pulse: (!) 112  84 89  Resp: 20  16 18   Temp: 98 F (36.7 C)  98.1 F (36.7 C) 98 F (36.7 C)  TempSrc:  Oral   Oral  SpO2: 94%  99% 99%  Weight:  136.1 kg    Height:  6\' 1"  (1.854 m)     Physical Exam Vitals and nursing note reviewed.  Constitutional:      General: He is not in acute distress. HENT:     Head: Normocephalic and atraumatic.  Cardiovascular:     Rate and Rhythm: Normal rate and regular rhythm.     Heart sounds: Normal heart sounds.  Pulmonary:     Effort: Pulmonary effort is normal.     Breath sounds: Wheezing present.     Comments: Few scattered wheezes Abdominal:     Palpations: Abdomen is soft.     Tenderness: There is no abdominal tenderness.  Musculoskeletal:     Right lower leg: Edema present.     Left lower leg: Edema present.     Comments: 1-2+ lower extremity edema  Neurological:     Mental Status: Mental status is at baseline.  Labs on Admission: I have personally reviewed following labs and imaging studies  CBC: Recent Labs  Lab 06/19/22 1708  WBC 21.4*  HGB 14.3  HCT 45.1  MCV 92.0  PLT 255   Basic Metabolic Panel: Recent Labs  Lab 06/19/22 1708 06/19/22 2054  NA 135  --   K 3.7  --   CL 91*  --   CO2 33*  --   GLUCOSE 221*  --   BUN 20  --   CREATININE 1.17  --   CALCIUM 8.8*  --   MG  --  1.7   GFR: Estimated Creatinine Clearance: 91.2 mL/min (by C-G formula based on SCr of 1.17 mg/dL). Liver Function Tests: No results for input(s): "AST", "ALT", "ALKPHOS", "BILITOT", "PROT", "ALBUMIN" in the last 168 hours. No results for input(s): "LIPASE", "AMYLASE" in the last 168 hours. No results for input(s): "AMMONIA" in the last 168 hours. Coagulation Profile: No results for input(s): "INR", "PROTIME" in the last 168 hours. Cardiac Enzymes: No results for input(s): "CKTOTAL", "CKMB", "CKMBINDEX", "TROPONINI" in the last 168 hours. BNP (last 3 results) No results for input(s): "PROBNP" in the last 8760 hours. HbA1C: No results for input(s): "HGBA1C" in the last 72 hours. CBG: No results for input(s): "GLUCAP" in the last 168  hours. Lipid Profile: No results for input(s): "CHOL", "HDL", "LDLCALC", "TRIG", "CHOLHDL", "LDLDIRECT" in the last 72 hours. Thyroid Function Tests: No results for input(s): "TSH", "T4TOTAL", "FREET4", "T3FREE", "THYROIDAB" in the last 72 hours. Anemia Panel: No results for input(s): "VITAMINB12", "FOLATE", "FERRITIN", "TIBC", "IRON", "RETICCTPCT" in the last 72 hours. Urine analysis:    Component Value Date/Time   COLORURINE AMBER (A) 06/19/2022 2055   APPEARANCEUR CLOUDY (A) 06/19/2022 2055   APPEARANCEUR Clear 08/20/2013 0849   LABSPEC 1.014 06/19/2022 2055   LABSPEC 1.014 08/20/2013 0849   PHURINE 5.0 06/19/2022 2055   GLUCOSEU NEGATIVE 06/19/2022 2055   GLUCOSEU Negative 08/20/2013 0849   HGBUR MODERATE (A) 06/19/2022 2055   BILIRUBINUR NEGATIVE 06/19/2022 2055   BILIRUBINUR Negative 08/20/2013 0849   KETONESUR NEGATIVE 06/19/2022 2055   PROTEINUR >=300 (A) 06/19/2022 2055   NITRITE NEGATIVE 06/19/2022 2055   LEUKOCYTESUR LARGE (A) 06/19/2022 2055   LEUKOCYTESUR Negative 08/20/2013 0849    Radiological Exams on Admission: CT Cervical Spine Wo Contrast  Result Date: 06/19/2022 CLINICAL DATA:  Syncopal episode and subsequent fall. EXAM: CT CERVICAL SPINE WITHOUT CONTRAST TECHNIQUE: Multidetector CT imaging of the cervical spine was performed without intravenous contrast. Multiplanar CT image reconstructions were also generated. RADIATION DOSE REDUCTION: This exam was performed according to the departmental dose-optimization program which includes automated exposure control, adjustment of the mA and/or kV according to patient size and/or use of iterative reconstruction technique. COMPARISON:  None Available. FINDINGS: Alignment: There is straightening of the normal cervical spine lordosis. Skull base and vertebrae: No acute fracture. Chronic and degenerative changes seen along the tip of the dens and adjacent portion of the anterior arch of C1. Soft tissues and spinal canal: No  prevertebral fluid or swelling. No visible canal hematoma. Disc levels: Moderate severity endplate sclerosis and mild to moderate severity anterior osteophyte formation is seen at the level of C5-C6. Mild to moderate severity anterior osteophyte formation is also seen at the levels of C3-C4 and C6-C7. There is marked severity narrowing of the anterior atlantoaxial articulation. Moderate to marked severity intervertebral disc space narrowing is seen at the level of C5-C6. Bilateral moderate to marked severity intervertebral disc space narrowing is seen at the  level of C2-C3. Upper chest: Negative. Other: None. IMPRESSION: 1. No acute fracture or subluxation in the cervical spine. 2. Moderate to marked severity degenerative changes at the levels of C2-C3, C5-C6 and C6-C7. Electronically Signed   By: Virgina Norfolk M.D.   On: 06/19/2022 21:49   CT Angio Chest PE W/Cm &/Or Wo Cm  Result Date: 06/19/2022 CLINICAL DATA:  Central chest pain for few days.  Syncope episode. EXAM: CT ANGIOGRAPHY CHEST WITH CONTRAST TECHNIQUE: Multidetector CT imaging of the chest was performed using the standard protocol during bolus administration of intravenous contrast. Multiplanar CT image reconstructions and MIPs were obtained to evaluate the vascular anatomy. RADIATION DOSE REDUCTION: This exam was performed according to the departmental dose-optimization program which includes automated exposure control, adjustment of the mA and/or kV according to patient size and/or use of iterative reconstruction technique. CONTRAST:  62mL OMNIPAQUE IOHEXOL 350 MG/ML SOLN COMPARISON:  Chest radiograph dated March 14, 2022 FINDINGS: Cardiovascular: Satisfactory opacification of the pulmonary arteries to the segmental level. No evidence of pulmonary embolism. Normal heart size. No pericardial effusion. Main pulmonary trunk is dilated measuring up to 3.8 cm concerning for arterial hypertension. Mild coronary artery atherosclerotic calcifications.  Mediastinum/Nodes: No enlarged mediastinal, hilar, or axillary lymph nodes. Thyroid gland, trachea, and esophagus demonstrate no significant findings. Lungs/Pleura: Subsegmental linear atelectasis of the left lower lobe. No evidence of pneumonia or pulmonary edema. No pleural effusion pneumothorax. Upper Abdomen: Advanced fatty infiltration of the pancreas. No acute abnormality. 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 No acute osseous abnormality. Review of the MIP images confirms the above findings. IMPRESSION: 1. No evidence of pulmonary embolism. 2. Main pulmonary trunk is dilated measuring up to 3.8 cm concerning for pulmonary arterial hypertension. 3. No evidence of pneumonia or pulmonary edema. 4. 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 No acute osseous abnormality. 5. Advanced fatty infiltration of the pancreas. Aortic Atherosclerosis (ICD10-I70.0). Electronically Signed   By: Keane Police D.O.   On: 06/19/2022 21:47   CT Head Wo Contrast  Result Date: 06/19/2022 CLINICAL DATA:  Central chest pain. EXAM: CT HEAD WITHOUT CONTRAST TECHNIQUE: Contiguous axial images were obtained from the base of the skull through the vertex without intravenous contrast. RADIATION DOSE REDUCTION: This exam was performed according to the departmental dose-optimization program which includes automated exposure control, adjustment of the mA and/or kV according to patient size and/or use of iterative reconstruction technique. COMPARISON:  January 27, 2022 FINDINGS: Brain: There is mild cerebral atrophy with widening of the extra-axial spaces and ventricular dilatation. There are areas of decreased attenuation within the white matter tracts of the supratentorial brain, consistent with microvascular disease changes. Vascular: No hyperdense vessel or unexpected calcification. Skull: Normal. Negative for fracture or focal lesion. Sinuses/Orbits: There is mild left maxillary sinus mucosal thickening.  Other: None. IMPRESSION: 1. No acute intracranial abnormality. 2. Generalized cerebral atrophy and microvascular disease changes of the supratentorial brain. 3. Mild left maxillary sinus disease. Electronically Signed   By: Virgina Norfolk M.D.   On: 06/19/2022 21:45     Data Reviewed: Relevant notes from primary care and specialist visits, past discharge summaries as available in EHR, including Care Everywhere. Prior diagnostic testing as pertinent to current admission diagnoses Updated medications and problem lists for reconciliation ED course, including vitals, labs, imaging, treatment and response to treatment Triage notes, nursing and pharmacy notes and ED provider's notes Notable results as noted in HPI   Assessment and Plan: * Syncope and collapse History of falls  Possibly medication related given soft BP on arrival Daily orthostatics Head CT was nonacute Continue neurologic checks Suspecting related to acute illness We will monitor for now on telemetry Check VBG given elevated bicarb to 33    UTI (urinary tract infection) IV Rocephin and follow cultures  COPD with acute bronchitis (HCC) Chronic respiratory failure with hypoxia and hypercapnia VBG with normal pH and PCO2 of 80, baseline appears to be in the 70s CTA chest without evidence of pneumonia or edema or PE Schedule and as needed nebulized bronchodilator treatment Oral steroids Antitussives, incentive spirometry, flutter valve Supplemental oxygen to maintain sats over 90% Continue BiPAP nightly and when asleep   CAD (coronary artery disease) Chest pain, atypical Elevated troponin suspect related to demand ischemia but will continue to trend Chest pain at this time suspect related to bronchitis CTA chest negative for PE Patient had left heart cath July 2019 that showed mild CAD with mild CAD mid LAD  Continue aspirin, atorvastatin and Imdur Nitroglycerin sublingual as needed chest pain with morphine for  breakthrough     Chronic diastolic CHF (congestive heart failure) (HCC) Lower extremity edema, otherwise mostly euvolemic Continue home lisinopril, metoprolol and Imdur, furosemide and spironolactone diastolic heart failure, last EF 60-65% 07/2021  Essential hypertension Will hold off on amlodipine due to soft BP on arrival Continue lisinopril, metoprolol   Type 2 diabetes mellitus with hyperglycemia, with long-term current use of insulin (HCC) Blood sugar 211 in the emergency room Basal insulin Sliding scale insulin coverage  Follow A1c  Chronic back pain Chronic bilateral leg weakness Patient was hospitalized in July for acute on chronic low back pain MRI showed disc protrusion at L4-L5 transverse left L5 nerve root, mild to moderate left and mild right neural foraminal stenosis, multilevel degenerative disc disease of lumbar spine.   OSA (obstructive sleep apnea) Continue bipap QHS  Obesity, Class III, BMI 40-49.9 (morbid obesity) (HCC) Complicating factor to overall prognosis and care        DVT prophylaxis: Lovenox  Consults: none  Advance Care Planning:   Code Status: Prior   Family Communication: none  Disposition Plan: Back to previous home environment  Severity of Illness: The appropriate patient status for this patient is INPATIENT. Inpatient status is judged to be reasonable and necessary in order to provide the required intensity of service to ensure the patient's safety. The patient's presenting symptoms, physical exam findings, and initial radiographic and laboratory data in the context of their chronic comorbidities is felt to place them at high risk for further clinical deterioration. Furthermore, it is not anticipated that the patient will be medically stable for discharge from the hospital within 2 midnights of admission.   * I certify that at the point of admission it is my clinical judgment that the patient will require inpatient hospital care  spanning beyond 2 midnights from the point of admission due to high intensity of service, high risk for further deterioration and high frequency of surveillance required.*  Author: Andris Baumann, MD 06/19/2022 11:26 PM  For on call review [email or web address].

## 2022-06-19 NOTE — Assessment & Plan Note (Addendum)
Had 2 prior episodes.  Pt said there was no warning, but also said his legs gave out.   --Echo unremarkable.  Tele unremarkable.  Carotid US unremarkable.   --Orthostatic BP positive today, with systolic dropped from 983 lying, to 93 sitting, to 85 standing, with associated dizziness and leg buckling. Plan: --NS@75  for 12 hours --hold all BP meds

## 2022-06-20 ENCOUNTER — Other Ambulatory Visit: Payer: Self-pay

## 2022-06-20 ENCOUNTER — Inpatient Hospital Stay: Payer: Medicare Other

## 2022-06-20 ENCOUNTER — Inpatient Hospital Stay
Admit: 2022-06-20 | Discharge: 2022-06-20 | Disposition: A | Discharge status: Home / Self Care | Payer: Medicare Other | Attending: Hospitalist | Admitting: Hospitalist

## 2022-06-20 ENCOUNTER — Encounter: Payer: Self-pay | Admitting: Internal Medicine

## 2022-06-20 DIAGNOSIS — R55 Syncope and collapse: Secondary | ICD-10-CM | POA: Diagnosis not present

## 2022-06-20 LAB — RESPIRATORY PANEL BY PCR

## 2022-06-20 LAB — GLUCOSE, CAPILLARY
Glucose-Capillary: 176 mg/dL — ABNORMAL HIGH (ref 70–99)
Glucose-Capillary: 195 mg/dL — ABNORMAL HIGH (ref 70–99)
Glucose-Capillary: 208 mg/dL — ABNORMAL HIGH (ref 70–99)
Glucose-Capillary: 284 mg/dL — ABNORMAL HIGH (ref 70–99)
Glucose-Capillary: 312 mg/dL — ABNORMAL HIGH (ref 70–99)

## 2022-06-20 LAB — BLOOD GAS, VENOUS
Acid-Base Excess: 15.6 mmol/L — ABNORMAL HIGH (ref 0.0–2.0)
Bicarbonate: 45.2 mmol/L — ABNORMAL HIGH (ref 20.0–28.0)
O2 Saturation: 54.7 %
Patient temperature: 37
pCO2, Ven: 80 mmHg (ref 44–60)
pH, Ven: 7.36 (ref 7.25–7.43)
pO2, Ven: 39 mmHg (ref 32–45)

## 2022-06-20 LAB — ECHOCARDIOGRAM COMPLETE
Height: 73 in
S' Lateral: 2.8 cm
Weight: 4895.98 oz

## 2022-06-20 LAB — TROPONIN I (HIGH SENSITIVITY): Troponin I (High Sensitivity): 90 ng/L — ABNORMAL HIGH (ref <18)

## 2022-06-20 MED ORDER — FUROSEMIDE 40 MG PO TABS
40.0000 mg | ORAL_TABLET | Freq: Two times a day (BID) | ORAL | Status: DC
  Administered 2022-06-20 – 2022-06-21 (×3): 40 mg via ORAL
  Filled 2022-06-20 (×3): qty 1

## 2022-06-20 MED ORDER — KETOROLAC TROMETHAMINE 15 MG/ML IJ SOLN
15.0000 mg | Freq: Four times a day (QID) | INTRAMUSCULAR | Status: AC | PRN
  Administered 2022-06-20: 15 mg via INTRAVENOUS
  Filled 2022-06-20: qty 1

## 2022-06-20 MED ORDER — HYDROCODONE-ACETAMINOPHEN 5-325 MG PO TABS
1.0000 | ORAL_TABLET | Freq: Four times a day (QID) | ORAL | Status: DC | PRN
  Administered 2022-06-20 – 2022-06-23 (×5): 1 via ORAL
  Filled 2022-06-20 (×5): qty 1

## 2022-06-20 MED ORDER — SPIRONOLACTONE 12.5 MG HALF TABLET
12.5000 mg | ORAL_TABLET | Freq: Every day | ORAL | Status: DC
  Administered 2022-06-20 – 2022-06-21 (×2): 12.5 mg via ORAL
  Filled 2022-06-20 (×2): qty 1

## 2022-06-20 MED ORDER — SODIUM CHLORIDE 0.9 % IV SOLN: INTRAVENOUS | Status: DC | PRN

## 2022-06-20 MED ORDER — ISOSORBIDE DINITRATE 30 MG PO TABS
30.0000 mg | ORAL_TABLET | Freq: Every morning | ORAL | Status: DC
  Administered 2022-06-20 – 2022-06-21 (×2): 30 mg via ORAL
  Filled 2022-06-20 (×2): qty 1

## 2022-06-20 MED ORDER — ATORVASTATIN CALCIUM 20 MG PO TABS
40.0000 mg | ORAL_TABLET | Freq: Every day | ORAL | Status: DC
  Administered 2022-06-20 – 2022-06-25 (×6): 40 mg via ORAL
  Filled 2022-06-20 (×6): qty 2

## 2022-06-20 MED ORDER — INSULIN ASPART 100 UNIT/ML IJ SOLN
4.0000 [IU] | Freq: Three times a day (TID) | INTRAMUSCULAR | Status: DC
  Administered 2022-06-20 – 2022-06-24 (×12): 4 [IU] via SUBCUTANEOUS
  Filled 2022-06-20 (×12): qty 1

## 2022-06-20 MED ORDER — INSULIN DETEMIR 100 UNIT/ML ~~LOC~~ SOLN
20.0000 [IU] | Freq: Two times a day (BID) | SUBCUTANEOUS | Status: DC
  Administered 2022-06-20 – 2022-06-25 (×11): 20 [IU] via SUBCUTANEOUS
  Filled 2022-06-20 (×12): qty 0.2

## 2022-06-20 NOTE — Progress Notes (Signed)
Patient complains of dizziness and syncopal episodes while coughing (especially when cough is very forceful).  Patient also endorses bilateral knee pain (due to fall), chest pain, upper abdominal pain (lower ribcage area) and an inability to walk.   MD made aware.     Per patient- tramadol makes him sick.  MD ordered toradol.

## 2022-06-20 NOTE — Progress Notes (Signed)
Pt refused Bipap. Bipap remains at bedside, pt aware if he changes his mind we can place it on.

## 2022-06-20 NOTE — Progress Notes (Signed)
Per patient- toradol did not help pain.  MD aware.    X-rays ordered for bilateral knees, pain meds changed to norco  Will continue to monitor

## 2022-06-20 NOTE — Inpatient Diabetes Management (Signed)
Inpatient Diabetes Program Recommendations  AACE/ADA: New Consensus Statement on Inpatient Glycemic Control   Target Ranges:  Prepandial:   less than 140 mg/dL      Peak postprandial:   less than 180 mg/dL (1-2 hours)      Critically ill patients:  140 - 180 mg/dL    Latest Reference Range & Units 06/20/22 00:55 06/20/22 07:51  Glucose-Capillary 70 - 99 mg/dL 208 (H) 312 (H)   Review of Glycemic Control  Diabetes history: DM2 Outpatient Diabetes medications: Levemir 50 units BID, Humalog 16 units TID with meals, Metformin 1000 mg BID Current orders for Inpatient glycemic control: Levemir 20 units QHS, Novolog 0-20 units TID with meals, Novolog 0-5 units QHS; Prednisone 40 mg QAM  Inpatient Diabetes Program Recommendations:    Insulin: No Levemir given last night (current order to start today at 10pm). If steroids continued as ordered, please consider changing Levemir to 20 units BID (to start now) and ordering Novolog 4 units TID with meals for meal coverage if patient eats at least 50% of meals.   Thanks, Barnie Alderman, RN, MSN, [facility] Coordinator Inpatient Diabetes Program [phone] (Team Pager from 8am to [place])

## 2022-06-20 NOTE — Progress Notes (Signed)
*  PRELIMINARY RESULTS* Echocardiogram 2D Echocardiogram has been performed.  Cody Alexander 06/20/2022, 9:03 AM

## 2022-06-20 NOTE — Assessment & Plan Note (Addendum)
--  appeared to be a chronic problem.  Pt walks with a cane or no assistance PTA --PT rec SNF rehab today

## 2022-06-20 NOTE — Progress Notes (Addendum)
PROGRESS NOTE    Cody Alexander  [id number]:[id number] DOB: July 13, 1956 DOA: 06/19/2022 PCP: Rutherford Limerick, PA  [id number]  LOS: 1 day   Brief hospital course:   Assessment & Plan: Cody Alexander is a 66 y.o. male with medical history significant for COPD on home O2 at 4 L, obesity, OSA, HTN, mild CAD on cardiac cath 03/1855, diastolic heart failure, last EF 60-65% 07/2021 morbid obesity, COPD with chronic respiratory failure on 4 L of oxygen, OSA, insulin-dependent diabetes mellitus, hypertension, chronic diastolic dysfunction CHF (EF of 60 - 65% in December 2022), chronic back pain, who presents to the ED following a syncopal event that occurred as he stood up to walk to the bathroom.  He fell backwards, striking his head on hardwood floor and was out for couple of minutes.   * Syncope and collapse Had 2 prior episodes.  Pt said there was no warning, but also said his legs gave out.  Unclear etiology. --Echo unremarkable.  Tele unremarkable. --had bilateral knee pain Plan: --carotid US --monitor on tele --Norco PRN for pain --xray both knees    UTI (urinary tract infection) --reported some burning with voiding --cont ceftriaxone pending urine cx  COPD with acute bronchitis (HCC) Chronic respiratory failure with hypoxia and hypercapnia on 4L O2 baseline VBG with normal pH and PCO2 of 80, baseline appears to be in the 70s CTA chest without evidence of pneumonia or edema or PE Plan: --hold steroid burst for now  Schedule and as needed nebulized bronchodilator treatment   CAD (coronary artery disease) Chest pain, atypical Elevated troponin chronic, 90's flat. CTA chest negative for PE Patient had left heart cath July 2019 that showed mild CAD  --repeat Echo today no acute finding. --cont  --Continue aspirin, atorvastatin and Imdur     Chronic diastolic CHF (congestive heart failure) (HCC) Chronic Lower extremity edema, otherwise mostly euvolemic Continue home  lisinopril, metoprolol and Imdur, furosemide and spironolactone  Leg weakness, bilateral --appeared to be a chronic problem.  Pt walks with a cane or no assistance. --PT  Essential hypertension Hold amlodipine due to soft BP on arrival Continue lisinopril, metoprolol, Lasix and aldactone  Type 2 diabetes mellitus with hyperglycemia, with long-term current use of insulin (HCC) --Levemir 20u BID --mealtime 4u TID --SSI  Chronic back pain Chronic bilateral leg weakness Patient was hospitalized in July for acute on chronic low back pain MRI showed disc protrusion at L4-L5 transverse left L5 nerve root, mild to moderate left and mild right neural foraminal stenosis, multilevel degenerative disc disease of lumbar spine.   OSA (obstructive sleep apnea) Continue bipap QHS  Obesity, Class III, BMI 40-49.9 (morbid obesity) (HCC) Complicating factor to overall prognosis and care   DVT prophylaxis: Lovenox SQ Code Status: Full code  Family Communication:  Level of care: Med-Surg Dispo:   The patient is from: home Anticipated d/c is to: home Anticipated d/c date is: 1-2 days Patient currently is not medically ready to d/c due to: continued workup for syncope and collapse    Subjective and Interval History:  Pt reported feeling congested, had a lot of cough.  Reported intermittent diarrhea that's chronic, and BLE swelling that's chronic.   Objective: Vitals:   06/20/22 1126 06/20/22 1300 06/20/22 1722 06/20/22 1726  BP: 118/75  114/71 121/78  Pulse: 88  75 80  Resp: 19  17   Temp: 98.4 F (36.9 C)  98.4 F (36.9 C)   TempSrc: Oral  Oral   SpO2: 94% 94% 94% 94%  Weight:      Height:        Intake/Output Summary (Last 24 hours) at 06/20/2022 1850 Last data filed at 06/20/2022 1810 Gross per 24 hour  Intake 580 ml  Output 1325 ml  Net -745 ml   Filed Weights   06/19/22 1706 06/20/22 0045  Weight: 136.1 kg (!) 138.8 kg    Examination:   Constitutional: NAD,  AAOx3 HEENT: conjunctivae and lids normal, EOMI CV: No cyanosis.   RESP: frequent coughs, on RA Extremities: non-pitting edema in BLE SKIN: warm, dry Neuro: II - XII grossly intact.   Psych: Normal mood and affect.  Appropriate judgement and reason   Data Reviewed: I have personally reviewed labs and imaging studies  Time spent: 50 minutes  [name], MD [facility] If 7PM-7AM, please contact night-coverage 06/20/2022, 6:50 PM

## 2022-06-21 DIAGNOSIS — R55 Syncope and collapse: Secondary | ICD-10-CM | POA: Diagnosis not present

## 2022-06-21 LAB — CBC
HCT: 41.6 % (ref 39.0–52.0)
Hemoglobin: 13.1 g/dL (ref 13.0–17.0)
MCH: 29.7 pg (ref 26.0–34.0)
MCHC: 31.5 g/dL (ref 30.0–36.0)
MCV: 94.3 fL (ref 80.0–100.0)
Platelets: 219 10*3/uL (ref 150–400)
RBC: 4.41 MIL/uL (ref 4.22–5.81)
RDW: 13.2 % (ref 11.5–15.5)
WBC: 15 10*3/uL — ABNORMAL HIGH (ref 4.0–10.5)
nRBC: 0 % (ref 0.0–0.2)

## 2022-06-21 LAB — BASIC METABOLIC PANEL
Anion gap: 7 (ref 5–15)
BUN: 24 mg/dL — ABNORMAL HIGH (ref 8–23)
CO2: 40 mmol/L — ABNORMAL HIGH (ref 22–32)
Calcium: 8.5 mg/dL — ABNORMAL LOW (ref 8.9–10.3)
Chloride: 92 mmol/L — ABNORMAL LOW (ref 98–111)
Creatinine, Ser: 1.21 mg/dL (ref 0.61–1.24)
GFR, Estimated: >60 mL/min (ref >60)
Glucose, Bld: 132 mg/dL — ABNORMAL HIGH (ref 70–99)
Potassium: 3.5 mmol/L (ref 3.5–5.1)
Sodium: 139 mmol/L (ref 135–145)

## 2022-06-21 LAB — GLUCOSE, CAPILLARY
Glucose-Capillary: 244 mg/dL — ABNORMAL HIGH (ref 70–99)
Glucose-Capillary: 157 mg/dL — ABNORMAL HIGH (ref 70–99)
Glucose-Capillary: 118 mg/dL — ABNORMAL HIGH (ref 70–99)
Glucose-Capillary: 128 mg/dL — ABNORMAL HIGH (ref 70–99)
Glucose-Capillary: 178 mg/dL — ABNORMAL HIGH (ref 70–99)

## 2022-06-21 LAB — MAGNESIUM: Magnesium: 2 mg/dL (ref 1.7–2.4)

## 2022-06-21 MED ORDER — SODIUM CHLORIDE 0.9 % IV SOLN: INTRAVENOUS | Status: AC

## 2022-06-21 MED ORDER — PREDNISONE 20 MG PO TABS
40.0000 mg | ORAL_TABLET | Freq: Every day | ORAL | Status: DC
  Administered 2022-06-21 – 2022-06-25 (×5): 40 mg via ORAL
  Filled 2022-06-21 (×5): qty 2

## 2022-06-21 MED ORDER — HYDROCOD POLI-CHLORPHE POLI ER 10-8 MG/5ML PO SUER
5.0000 mL | Freq: Four times a day (QID) | ORAL | Status: DC | PRN
  Administered 2022-06-21 – 2022-06-25 (×12): 5 mL via ORAL
  Filled 2022-06-21 (×13): qty 5

## 2022-06-21 NOTE — Care Management Important Message (Signed)
Important Message  Patient Details  Name: Cody Alexander MRN: [id number] Date of Birth: 06-28-1956   Medicare Important Message Given:  Yes  Reviewed Medicare IM with patient via room phone due to isolation status.  Copy of Medicare IM to be delivered to patient via nursing staff.   Dannette Barbara 06/21/2022, 2:47 PM

## 2022-06-21 NOTE — Progress Notes (Signed)
PROGRESS NOTE    Cody Alexander  GLO:[id number] DOB: Nov 11, 1955 DOA: 06/19/2022 PCP: Cody Pounds, PA  [id number]  LOS: 2 days   Brief hospital course:   Assessment & Plan: Cody Alexander is a 66 y.o. male with medical history significant for COPD on home O2 at 4 L, obesity, OSA, HTN, mild CAD on cardiac cath 02/2018, diastolic heart failure, last EF 60-65% 07/2021 morbid obesity, COPD with chronic respiratory failure on 4 L of oxygen, OSA, insulin-dependent diabetes mellitus, hypertension, chronic diastolic dysfunction CHF (EF of 60 - 65% in December 2022), chronic back pain, who presents to the ED following a syncopal event that occurred as he stood up to walk to the bathroom.  He fell backwards, striking his head on hardwood floor and was out for couple of minutes.   * Syncope and collapse Had 2 prior episodes.  Pt said there was no warning, but also said his legs gave out.   --Echo unremarkable.  Tele unremarkable.  Carotid US unremarkable.   --Orthostatic BP positive today, with systolic dropped from 121 lying, to 93 sitting, to 85 standing, with associated dizziness and leg buckling. Plan: --NS@75  for 12 hours --hold all BP meds    UTI (urinary tract infection) --reported some burning with voiding --cont ceftriaxone pending urine cx  COPD with acute bronchitis (HCC) Chronic respiratory failure with hypoxia and hypercapnia on 4L O2 baseline VBG with normal pH and PCO2 of 80, baseline appears to be in the 70s CTA chest without evidence of pneumonia or edema or PE.  Pt likely has URI with bad cough.  RVP neg. Plan: --resume prednisone 40 mg daily for likely brondhitis --Schedule and as needed nebulized bronchodilator treatment   CAD (coronary artery disease) Chest pain, atypical Elevated troponin chronic, 90's flat. CTA chest negative for PE Patient had left heart cath July 2019 that showed mild CAD  --repeat Echo no acute finding. --Continue aspirin,  atorvastatin      Chronic diastolic CHF (congestive heart failure) (HCC) Chronic Lower extremity edema, otherwise mostly euvolemic --Hold home lisinopril, metoprolol and Imdur, furosemide and spironolactone due to orthostasis.  Leg weakness, bilateral --appeared to be a chronic problem.  Pt walks with a cane or no assistance. --PT  Essential hypertension Hold amlodipine, lisinopril, metoprolol, Lasix and aldactone due to orthostasis  Type 2 diabetes mellitus with hyperglycemia, with long-term current use of insulin (HCC) --Levemir 20u BID --mealtime 4u TID --SSI  Chronic back pain Chronic bilateral leg weakness Patient was hospitalized in July for acute on chronic low back pain MRI showed disc protrusion at L4-L5 transverse left L5 nerve root, mild to moderate left and mild right neural foraminal stenosis, multilevel degenerative disc disease of lumbar spine.   OSA (obstructive sleep apnea) Pt refused bipap QHS  Obesity, Class III, BMI 40-49.9 (morbid obesity) (HCC) Complicating factor to overall prognosis and care   DVT prophylaxis: Lovenox SQ Code Status: Full code  Family Communication:  Level of care: Med-Surg Dispo:   The patient is from: home Anticipated d/c is to: home Anticipated d/c date is: 1-2 days Patient currently is not medically ready to d/c due to: orthostatic   Subjective and Interval History:  Pt complained of bad cough and being very sweaty.    Orthostatic BP positive, with systolic dropped from 121 lying, to 93 sitting, to 85 standing, with associated dizziness and leg buckling.   Objective: Vitals:   06/21/22 0459 06/21/22 0809 06/21/22 1217 06/21/22 1257  BP: 109/75 115/83 (!) 101/54  Pulse: 86 (!) 103 82 81  Resp: 20 20 18    Temp: 98 F (36.7 C) 97.7 F (36.5 C) (!) 97.4 F (36.3 C)   TempSrc: Oral Oral Oral   SpO2: 91% 94% (!) 87% 96%  Weight:      Height:        Intake/Output Summary (Last 24 hours) at 06/21/2022 1611 Last  data filed at 06/21/2022 1500 Gross per 24 hour  Intake 736.09 ml  Output 1550 ml  Net -813.91 ml   Filed Weights   06/19/22 1706 06/20/22 0045  Weight: 136.1 kg (!) 138.8 kg    Examination:   Constitutional: NAD, AAOx3 HEENT: conjunctivae and lids normal, EOMI CV: No cyanosis.   RESP: No wheezes, reduced lung sounds, on 4L, frequent coughs Neuro: II - XII grossly intact.   Psych: Normal mood and affect.  Appropriate judgement and reason   Data Reviewed: I have personally reviewed labs and imaging studies  Time spent: 50 minutes  Enzo Bi, MD [facility] If 7PM-7AM, please contact night-coverage 06/21/2022, 4:11 PM

## 2022-06-21 NOTE — TOC CM/SW Note (Signed)
  Transition of Care [facility]) Screening Note   Patient Details  Name: Cody Alexander Date of Birth: 05-04-1956   Transition of Care [facility]) CM/SW Contact:    Candie Chroman, LCSW Phone Number: 06/21/2022, 9:09 AM    Transition of Care Department [facility]) has reviewed patient and no TOC needs have been identified at this time. We will continue to monitor patient advancement through interdisciplinary progression rounds. If new patient transition needs arise, please place a TOC consult.

## 2022-06-21 NOTE — TOC Initial Note (Addendum)
Transition of Care [facility]) - Initial/Assessment Note    Patient Details  Name: Cody Alexander MRN: [id number] Date of Birth: [birth year]  Transition of Care [facility]) CM/SW Contact:    Candie Chroman, LCSW Phone Number: 06/21/2022, 1:19 PM  Clinical Narrative:   CSW met with patient. No supports at bedside. CSW introduced role and explained that PT recommendations would be discussed. Patient is agreeable to home health services. He worked with [facility] this summer so he would like to see if the can accept him back. Liaison is reviewing referral. Patient does not have a ride home and no money to cover a cab. Will likely need to set up door-to-door transport home. Patient has home oxygen through [facility] but does not have a tank here. CSW offered to order one to be delivered to the hospital prior to discharge but patient declined. He said he should be fine for the 20 minute ride home and often mows the yard without oxygen on. His roommate will let him in when he returns home. No further concerns. CSW encouraged patient to contact CSW as needed. CSW will continue to follow patient for support and facilitate return home once stable.      2:23 pm: [facility] is unable to accept referral. Checking with other agencies.    3:47 pm: [facility], [facility], and [facility] unable to accept. [facility] might be able to accept but would not be able to start until next week. [facility] Friend is reviewing referral. Left messages for [facility] and [facility].      4:03 pm: [facility] and [facility] are unable to accept referral. [facility] is checking.  4:30 pm: [facility] can accept for PT and RN but start of care would not be until Wednesday or Thursday. Called patient to notify but no answer.  Expected Discharge Plan: [place] Barriers to Discharge: Continued Medical Work up   Patient Goals and CMS Choice   CMS Medicare.gov Compare Post Acute Care list provided to:: Patient    Expected Discharge  Plan and Services Expected Discharge Plan: [place] Choice: [place] arrangements for the past 2 months: Single Family Home                                      Prior Living Arrangements/Services Living arrangements for the past 2 months: Single Family Home Lives with:: Roommate Patient language and need for interpreter reviewed:: Yes Do you feel safe going back to the place where you live?: Yes      Need for Family Participation in Patient Care: Yes (Comment) Care giver support system in place?: Yes (comment)   Criminal Activity/Legal Involvement Pertinent to Current Situation/Hospitalization: No - Comment as needed  Activities of Daily Living Home Assistive Devices/Equipment: BIPAP, Eyeglasses, Oxygen ADL Screening (condition at time of admission) Patient's cognitive ability adequate to safely complete daily activities?: Yes Is the patient deaf or have difficulty hearing?: No Does the patient have difficulty seeing, even when wearing glasses/contacts?: No Does the patient have difficulty concentrating, remembering, or making decisions?: No Patient able to express need for assistance with ADLs?: Yes Does the patient have difficulty dressing or bathing?: No Independently performs ADLs?: Yes (appropriate for developmental age) Communication: Independent Dressing (OT): Needs assistance Is this a change from baseline?: Pre-admission baseline Grooming: Needs assistance Is this a change from baseline?: Pre-admission  baseline Feeding: Independent Bathing: Needs assistance Is this a change from baseline?: Pre-admission baseline Toileting: Independent In/Out Bed: Needs assistance Is this a change from baseline?: Pre-admission baseline Walks in Home: Needs assistance Is this a change from baseline?: Pre-admission baseline Does the patient have difficulty walking or climbing stairs?: Yes Weakness of Legs: Both Weakness of  Arms/Hands: None  Permission Sought/Granted Permission sought to share information with : Facility Art therapist granted to share information with : Yes, Verbal Permission Granted     Permission granted to share info w AGENCY: Home Health Agencies        Emotional Assessment Appearance:: Appears stated age Attitude/Demeanor/Rapport: Engaged, Gracious Affect (typically observed): Accepting, Appropriate, Calm, Pleasant Orientation: : Oriented to Self, Oriented to Place, Oriented to  Time, Oriented to Situation Alcohol / Substance Use: Not Applicable Psych Involvement: No (comment)  Admission diagnosis:  Syncope and collapse [R55] COPD exacerbation (HCC) [J44.1] Acute cystitis without hematuria [N30.00] Generalized weakness [R53.1] Syncope, unspecified syncope type [R55] Patient Active Problem List   Diagnosis Date Noted   Chest pain    Syncope and collapse    Chronic back pain    Acute on chronic respiratory failure ([place]) 03/14/2022   Diabetes mellitus without complication (HCC)    Acute exacerbation of chronic low back pain    UTI (urinary tract infection)    HLD (hyperlipidemia) 01/27/2022   CAD (coronary artery disease) 01/27/2022   Fall at home, initial encounter 01/27/2022   Leg weakness, bilateral 01/27/2022   Hyperkalemia    Acute CHF (congestive heart failure) ([place]) 08/15/2021   Benign prostatic hyperplasia with urinary hesitancy    Lower abdominal pain    Elevated troponin    Rash    Uncontrolled type 2 diabetes mellitus with hyperglycemia (HCC) 01/04/2021   Chronic diastolic CHF (congestive heart failure) ([place]) 01/04/2021   CHF (congestive heart failure), NYHA class I, acute, diastolic ([place]) 16/05/9603   OSA (obstructive sleep apnea) 01/04/2021   Obesity (BMI 30-39.9) 01/04/2021   Chronic respiratory failure with hypoxia ([place]) 01/04/2021   Acute on chronic congestive heart failure (HCC)    Acute on chronic respiratory failure with hypoxia  ([place]) 06/20/2020   NSTEMI (non-ST elevated myocardial infarction) ([place]) 02/25/2018   Obesity, Class III, BMI 40-49.9 (morbid obesity) ([place]) 01/19/2017   COPD with acute bronchitis ([place]) 05/13/2014   Type 2 diabetes mellitus with hyperglycemia, with long-term current use of insulin ([place]) 05/13/2014   Essential hypertension 05/13/2014   PCP:  Rutherford Limerick, PA Pharmacy:   [id number] [id number]: [phone]: [phone]  [id number] [address] [id number]: [phone]: [phone]   Social Determinants of Health (SDOH) Interventions Housing Interventions: Intervention Not Indicated  Readmission Risk Interventions     No data to display

## 2022-06-21 NOTE — Evaluation (Signed)
Physical Therapy Evaluation Patient Details Name: Deakyn Stolzenburg MRN: [id number] DOB: [birth year] Today's Date: 06/21/2022  History of Present Illness  Patient is a 66 year old male with medical history significant for COPD on home O2 at 4 L, obesity, OSA, HTN, mild CAD on cardiac cath 02/2018, diastolic heart failure, morbid obesity, COPD with chronic respiratory failure on 4 L of oxygen, OSA, insulin-dependent diabetes mellitus, hypertension, chronic diastolic dysfunction CHF, chronic back pain who presents following syncopal episode   Clinical Impression  The patient is agreeable to PT evaluation. He reports multiple falls at home recently where he reports "blacking out" and hitting the floor. He reports he lives with a 60 year old roommate in a mobile home with 4 steps to enter. He uses 4 L02 and does not typically use DME with ambulation. He does not drive.   Today, the patient required physical assistance for bed mobility for trunk support to sit upright. Dizziness reported with sitting upright. Assistance required for standing with dizziness reported while standing. Bilateral knees buckling in standing and ambulation not attempted for safety concerns. Orthostatic vitals taken during session. He was unable to stand long enough to get the 3 minute standing blood pressure.   Orthostatic VS for the past 24 hrs:  BP- Lying Pulse- Lying BP- Sitting Pulse- Sitting BP- Standing at 0 minutes Pulse- Standing at 0 minutes  06/21/22 0900 121/63 103 93/69 115 (!) 85/61 117   He appears to have generalized weakness and limited activity tolerance overall. Sp02 89% on 4 L02, intermittent coughing during session. He reports he is not interested in going to SNF for short term rehab and wants to return home when he feels better. Recommend PT follow up to maximize independence and decrease caregiver burden. Recommend HHPT at a minimum and patient likely to require caregiver assistance at home.       Recommendations for follow up therapy are one component of a multi-disciplinary discharge planning process, led by the attending physician.  Recommendations may be updated based on patient status, additional functional criteria and insurance authorization.  Follow Up Recommendations Home health PT (patient is refusing any type of short term rehab placement, although he could benefit)      Assistance Recommended at Discharge Intermittent Supervision/Assistance  Patient can return home with the following  A lot of help with walking and/or transfers;A little help with bathing/dressing/bathroom;Assist for transportation;Help with stairs or ramp for entrance;Assistance with cooking/housework    Equipment Recommendations None recommended by PT  Recommendations for Other Services       Functional Status Assessment Patient has had a recent decline in their functional status and demonstrates the ability to make significant improvements in function in a reasonable and predictable amount of time.     Precautions / Restrictions Precautions Precautions: Fall Precaution Comments: monitor BP Restrictions Weight Bearing Restrictions: No      Mobility  Bed Mobility Overal bed mobility: Needs Assistance Bed Mobility: Supine to Sit, Sit to Supine     Supine to sit: Mod assist, HOB elevated Sit to supine: Min assist   General bed mobility comments: assistance for trunk support to sit upright. verbal cues for technique. dizziness reported with sitting upright    Transfers Overall transfer level: Needs assistance Equipment used: Rolling walker (2 wheels) Transfers: Sit to/from Stand Sit to Stand: Mod assist           General transfer comment: lifting assistance required for standing. dizziness reported and orthostatics positive.    Ambulation/Gait  General Gait Details: unsafe to attempt at this time due to blood pressure, dizziness with standing, knees  buckling  Stairs            Wheelchair Mobility    Modified Rankin (Stroke Patients Only)       Balance Overall balance assessment: Needs assistance Sitting-balance support: Feet supported Sitting balance-Leahy Scale: Good     Standing balance support: Bilateral upper extremity supported, During functional activity, Reliant on assistive device for balance Standing balance-Leahy Scale: Fair Standing balance comment: no external support required to maintain standing balance. patient unable to stand long enough to get the 3 minute blood pressure reading with the orthostatic vitals                             Pertinent Vitals/Pain Pain Assessment Pain Assessment: No/denies pain    Home Living Family/patient expects to be discharged to:: Private residence Living Arrangements: Non-relatives/Friends Available Help at Discharge: Family;Available PRN/intermittently (66 year old) Type of Home: Mobile home Home Access: Stairs to enter Entrance Stairs-Rails: Left Entrance Stairs-Number of Steps: 4   Home Layout: One level Home Equipment: Cane - single point;Rolling Walker (2 wheels) (Bi-Pap) Additional Comments: multiple falls recently at home where patient had apparent syncopal episodes    Prior Function Prior Level of Function : Independent/Modified Independent;History of Falls (last six months)             Mobility Comments: independent, 4 L02 at baseline, does not drive ADLs Comments: independent     Hand Dominance        Extremity/Trunk Assessment   Upper Extremity Assessment Upper Extremity Assessment: Generalized weakness    Lower Extremity Assessment Lower Extremity Assessment: Generalized weakness       Communication   Communication: No difficulties  Cognition Arousal/Alertness: Awake/alert Behavior During Therapy: WFL for tasks assessed/performed Overall Cognitive Status: Within Functional Limits for tasks assessed                                           General Comments General comments (skin integrity, edema, etc.): activity tolerance limited by dizziness in standing. see vitals signs flowsheet for blood pressure and heart rate readings    Exercises     Assessment/Plan    PT Assessment Patient needs continued PT services  PT Problem List Decreased strength;Decreased range of motion;Decreased activity tolerance;Decreased balance;Decreased mobility;Cardiopulmonary status limiting activity       PT Treatment Interventions DME instruction;Gait training;Stair training;Functional mobility training;Therapeutic activities;Therapeutic exercise;Balance training;Neuromuscular re-education;Patient/family education    PT Goals (Current goals can be found in the Care Plan section)  Acute Rehab PT Goals Patient Stated Goal: to go home in a few days PT Goal Formulation: With patient Time For Goal Achievement: 07/05/22 Potential to Achieve Goals: Fair    Frequency Min 2X/week     Co-evaluation               AM-PAC PT "6 Clicks" Mobility  Outcome Measure Help needed turning from your back to your side while in a flat bed without using bedrails?: A Little Help needed moving from lying on your back to sitting on the side of a flat bed without using bedrails?: A Lot Help needed moving to and from a bed to a chair (including a wheelchair)?: A Lot Help needed standing up from a chair using your  arms (e.g., wheelchair or bedside chair)?: A Little Help needed to walk in hospital room?: A Little Help needed climbing 3-5 steps with a railing? : A Little 6 Click Score: 16    End of Session   Activity Tolerance: Patient tolerated treatment well Patient left: in bed;with call bell/phone within reach;with bed alarm set Nurse Communication:  (discussed orthostatic blood pressure readings with MD via secure chat) PT Visit Diagnosis: Unsteadiness on feet (R26.81);Muscle weakness (generalized) (M62.81)     Time: [id number] PT Time Calculation (min) (ACUTE ONLY): 37 min   Charges:   PT Evaluation $PT Eval Moderate Complexity: 1 Mod PT Treatments $Therapeutic Activity: 8-22 mins        Minna Merritts, PT, MPT   Percell Locus 06/21/2022, 9:53 AM

## 2022-06-22 ENCOUNTER — Encounter: Payer: Self-pay | Admitting: Internal Medicine

## 2022-06-22 ENCOUNTER — Inpatient Hospital Stay: Payer: Medicare Other

## 2022-06-22 DIAGNOSIS — R55 Syncope and collapse: Secondary | ICD-10-CM | POA: Diagnosis not present

## 2022-06-22 LAB — CBC
HCT: 42 % (ref 39.0–52.0)
Hemoglobin: 13 g/dL (ref 13.0–17.0)
MCH: 29.1 pg (ref 26.0–34.0)
MCHC: 31 g/dL (ref 30.0–36.0)
MCV: 94.2 fL (ref 80.0–100.0)
Platelets: 240 10*3/uL (ref 150–400)
RBC: 4.46 MIL/uL (ref 4.22–5.81)
RDW: 13.2 % (ref 11.5–15.5)
WBC: 11.1 10*3/uL — ABNORMAL HIGH (ref 4.0–10.5)
nRBC: 0 % (ref 0.0–0.2)

## 2022-06-22 LAB — HEPATIC FUNCTION PANEL
ALT: 11 U/L (ref 0–44)
AST: 12 U/L — ABNORMAL LOW (ref 15–41)
Albumin: 2.9 g/dL — ABNORMAL LOW (ref 3.5–5.0)
Alkaline Phosphatase: 71 U/L (ref 38–126)
Bilirubin, Direct: 0.1 mg/dL (ref 0.0–0.2)
Indirect Bilirubin: 0.5 mg/dL (ref 0.3–0.9)
Total Bilirubin: 0.6 mg/dL (ref 0.3–1.2)
Total Protein: 6.9 g/dL (ref 6.5–8.1)

## 2022-06-22 LAB — GLUCOSE, CAPILLARY
Glucose-Capillary: 187 mg/dL — ABNORMAL HIGH (ref 70–99)
Glucose-Capillary: 225 mg/dL — ABNORMAL HIGH (ref 70–99)
Glucose-Capillary: 290 mg/dL — ABNORMAL HIGH (ref 70–99)
Glucose-Capillary: 315 mg/dL — ABNORMAL HIGH (ref 70–99)

## 2022-06-22 LAB — BASIC METABOLIC PANEL
Anion gap: 8 (ref 5–15)
BUN: 25 mg/dL — ABNORMAL HIGH (ref 8–23)
CO2: 34 mmol/L — ABNORMAL HIGH (ref 22–32)
Calcium: 8.4 mg/dL — ABNORMAL LOW (ref 8.9–10.3)
Chloride: 96 mmol/L — ABNORMAL LOW (ref 98–111)
Creatinine, Ser: 1.25 mg/dL — ABNORMAL HIGH (ref 0.61–1.24)
GFR, Estimated: >60 mL/min (ref >60)
Glucose, Bld: 229 mg/dL — ABNORMAL HIGH (ref 70–99)
Potassium: 4.2 mmol/L (ref 3.5–5.1)
Sodium: 138 mmol/L (ref 135–145)

## 2022-06-22 LAB — URINE CULTURE: Culture: >=100000 — AB

## 2022-06-22 LAB — MAGNESIUM: Magnesium: 2.2 mg/dL (ref 1.7–2.4)

## 2022-06-22 LAB — AMMONIA: Ammonia: 27 umol/L (ref 9–35)

## 2022-06-22 MED ORDER — ACETYLCYSTEINE 20 % IN SOLN
4.0000 mL | Freq: Two times a day (BID) | RESPIRATORY_TRACT | Status: DC
  Administered 2022-06-22 – 2022-06-25 (×7): 4 mL via RESPIRATORY_TRACT
  Filled 2022-06-22 (×8): qty 4

## 2022-06-22 MED ORDER — IPRATROPIUM-ALBUTEROL 0.5-2.5 (3) MG/3ML IN SOLN
3.0000 mL | Freq: Four times a day (QID) | RESPIRATORY_TRACT | Status: DC
  Administered 2022-06-22 – 2022-06-24 (×9): 3 mL via RESPIRATORY_TRACT
  Filled 2022-06-22 (×8): qty 3

## 2022-06-22 MED ORDER — PHENOL 1.4 % MT LIQD
1.0000 | OROMUCOSAL | Status: DC | PRN
  Administered 2022-06-22: 1 via OROMUCOSAL
  Filled 2022-06-22: qty 177

## 2022-06-22 NOTE — Plan of Care (Signed)

## 2022-06-22 NOTE — Progress Notes (Signed)
Physical Therapy Treatment Patient Details Name: Cody Alexander MRN: [id number] DOB: 19-Aug-1956 Today's Date: 06/22/2022   History of Present Illness Patient is a 66 year old male with medical history significant for COPD on home O2 at 4L, obesity, OSA, HTN, mild CAD on cardiac cath 04/9370, diastolic heart failure, morbid obesity, COPD with chronic respiratory failure on 4L of oxygen, OSA, insulin-dependent diabetes mellitus, hypertension, chronic diastolic dysfunction CHF, chronic back pain who presents following syncopal episode.    PT Comments    Pt seen again due to continued inability to tolerate sustained standing. BP stable today without orthostasis seen previous day. Pt very weak, requires modA for bed mobility and minA to rise to standing, still has significant CP and frequent coughing in session. Upon standing, random jerking of legs bilat, frequent buckling. 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 Pt provided knee block for safety for subsequent standing practice and exercise, also noted to have asterixis jerking in arms while supporting self in stance. I am not aware of any workup thus far that would speak to this phenomenon, pt denies any prior difficulty with it save for his buckling/falling event immediately PTA. MD made aware, as prognosis is difficult to determine at this time. Pt left at EOB, all needs met, assisted with ordering meal.      Recommendations for follow up therapy are one component of a multi-disciplinary discharge planning process, led by the attending physician.  Recommendations may be updated based on patient status, additional functional criteria and insurance authorization.  Follow Up Recommendations  Skilled nursing-short term rehab (<3 hours/day) Can patient physically be transported by private vehicle: No   Assistance Recommended  at Discharge Intermittent Supervision/Assistance  Patient can return home with the following A lot of help with walking and/or transfers;A little help with bathing/dressing/bathroom;Assist for transportation;Help with stairs or ramp for entrance;Assistance with cooking/housework   Equipment Recommendations  None recommended by PT    Recommendations for Other Services       Precautions / Restrictions Precautions Precautions: Fall Restrictions Weight Bearing Restrictions: No     Mobility  Bed Mobility Overal bed mobility: Needs Assistance Bed Mobility: Supine to Sit     Supine to sit: Mod assist     General bed mobility comments: brief dizziness at EOB, resolves within 2 minutes, no BP drop    Transfers Overall transfer level: Needs assistance Equipment used: Rolling walker (2 wheels) (chair back) Transfers: Sit to/from Stand Sit to Stand: Min assist           General transfer comment: Knee block in place due to buckling, jerking of BUE on RW as well    Ambulation/Gait Ambulation/Gait assistance:  (unsafe to attempt)                 Stairs             Wheelchair Mobility    Modified Rankin (Stroke Patients Only)       Balance                                            Cognition Arousal/Alertness: Awake/alert Behavior During Therapy: WFL for tasks assessed/performed Overall Cognitive Status: Within Functional Limits for tasks assessed  Exercises Other Exercises Other Exercises: EOB face weashing with towel Other Exercises: EOB urine voiding with urinal Other Exercises: STS from EOB x5, elevated surface, 2 hands on chair back, Other Exercises: Sustained standing at EOB x90 sec (BUE supported) then Left leg marching x7 (unable to get to 10)    General Comments        Pertinent Vitals/Pain Pain Assessment Pain Assessment: No/denies pain    Home Living                           Prior Function            PT Goals (current goals can now be found in the care plan section) Acute Rehab PT Goals Patient Stated Goal: to go home in a few days PT Goal Formulation: With patient Time For Goal Achievement: 07/05/22 Potential to Achieve Goals: Fair Progress towards PT goals: Progressing toward goals    Frequency    Min 2X/week      PT Plan Current plan remains appropriate    Co-evaluation              AM-PAC PT "6 Clicks" Mobility   Outcome Measure  Help needed turning from your back to your side while in a flat bed without using bedrails?: A Lot Help needed moving from lying on your back to sitting on the side of a flat bed without using bedrails?: A Lot Help needed moving to and from a bed to a chair (including a wheelchair)?: A Lot Help needed standing up from a chair using your arms (e.g., wheelchair or bedside chair)?: A Lot Help needed to walk in hospital room?: Total Help needed climbing 3-5 steps with a railing? : Total 6 Click Score: 10    End of Session Equipment Utilized During Treatment: Gait belt Activity Tolerance: Patient tolerated treatment well Patient left: in bed;with call bell/phone within reach Nurse Communication: Mobility status PT Visit Diagnosis: Unsteadiness on feet (R26.81);Muscle weakness (generalized) (M62.81)     Time: 1062-6948 PT Time Calculation (min) (ACUTE ONLY): 33 min  Charges:  $Therapeutic Exercise: 8-22 mins $Therapeutic Activity: 8-22 mins                    12:38 PM, 06/22/22 Etta Grandchild, PT, DPT Physical Therapist - [facility]  [phone] ([place])   Cody Alexander 06/22/2022, 12:30 PM

## 2022-06-22 NOTE — TOC Progression Note (Addendum)
Transition of Care [facility]) - Progression Note    Patient Details  Name: Lamoyne Hessel MRN: [id number] Date of Birth: [birth year]  Transition of Care [facility]) CM/SW Contact  Valente David, RN Phone Number: 06/22/2022, 12:28 PM  Clinical Narrative:     Spoke with patient, aware that [facility] has accepted his case for services once medically ready for discharge.  He report he is not able to walk and unsure if he will be able to go home.  He would like to work with PT again for reassessment.  Per PT evaluation yesterday, patient refusing SNF for short term rehab.  TOC will continue to follow, will initiate bed search if agrees to SNF.  Update 1438: PT notes now recommending SNF for rehab, MD requesting SNF workup.  Spoke with patient, he is hesitant with agreement to start bed search.  Safety in the home and current mobility discussed, agrees to SNF at this time.  FL2 completed, bed search initiated as patient does not have a preference.   Expected Discharge Plan: [place] Barriers to Discharge: Continued Medical Work up  Expected Discharge Plan and Services Expected Discharge Plan: [place] Choice: [place] arrangements for the past 2 months: Single Family Home                                       Social Determinants of Health (SDOH) Interventions Housing Interventions: Intervention Not Indicated  Readmission Risk Interventions     No data to display

## 2022-06-22 NOTE — NC FL2 (Signed)
[place] LEVEL OF CARE SCREENING TOOL     IDENTIFICATION  [name] Birthdate: [birth year] Sex: male Admission Date (Current Location): 06/19/2022  [place] and [place] Number:  Engineering geologist and Address:  [facility], [address] [place], [place] [address]      Provider Number: [id number]  Attending Physician Name and Address:  Enzo Bi, MD  Relative Name and Phone Number:       Current Level of Care: Hospital Recommended Level of Care: [place] Prior Approval Number:    Date Approved/Denied:   PASRR Number: [id number]  Discharge Plan: SNF    Current Diagnoses: Patient Active Problem List   Diagnosis Date Noted   Chest pain    Syncope and collapse    Chronic back pain    Acute on chronic respiratory failure ([place]) 03/14/2022   Diabetes mellitus without complication (HCC)    Acute exacerbation of chronic low back pain    UTI (urinary tract infection)    HLD (hyperlipidemia) 01/27/2022   CAD (coronary artery disease) 01/27/2022   Fall at home, initial encounter 01/27/2022   Leg weakness, bilateral 01/27/2022   Hyperkalemia    Acute CHF (congestive heart failure) ([place]) 08/15/2021   Benign prostatic hyperplasia with urinary hesitancy    Lower abdominal pain    Elevated troponin    Rash    Uncontrolled type 2 diabetes mellitus with hyperglycemia (HCC) 01/04/2021   Chronic diastolic CHF (congestive heart failure) ([place]) 01/04/2021   CHF (congestive heart failure), NYHA class I, acute, diastolic (HCC) 40/81/4481   OSA (obstructive sleep apnea) 01/04/2021   Obesity (BMI 30-39.9) 01/04/2021   Chronic respiratory failure with hypoxia ([place]) 01/04/2021   Acute on chronic congestive heart failure (HCC)    Acute on chronic respiratory failure with hypoxia ([place]) 06/20/2020   NSTEMI (non-ST elevated myocardial infarction) ([place]) 02/25/2018   Obesity, Class III, BMI 40-49.9 (morbid  obesity) ([place]) 01/19/2017   COPD with acute bronchitis ([place]) 05/13/2014   Type 2 diabetes mellitus with hyperglycemia, with long-term current use of insulin ([place]) 05/13/2014   Essential hypertension 05/13/2014    Orientation RESPIRATION BLADDER Height & Weight     Self, Time, Situation, Place  Normal Continent Weight: (!) 138.8 kg Height:  6\' 1"  (185.4 cm)  BEHAVIORAL SYMPTOMS/MOOD NEUROLOGICAL BOWEL NUTRITION STATUS      Continent Diet  AMBULATORY STATUS COMMUNICATION OF NEEDS Skin   Extensive Assist Verbally Normal                       Personal Care Assistance Level of Assistance  Bathing, Dressing Bathing Assistance: Limited assistance   Dressing Assistance: Limited assistance     Functional Limitations Info             SPECIAL CARE FACTORS FREQUENCY  PT (By licensed PT), OT (By licensed OT)     PT Frequency: 5 times a week OT Frequency: 5 times a week            Contractures Contractures Info: Not present    Additional Factors Info  Code Status, Allergies Code Status Info: Full Allergies Info: Erythromycin           Current Medications (06/22/2022):  This is the current hospital active medication list Current Facility-Administered Medications  Medication Dose Route Frequency Provider Last Rate Last Admin   0.9 %  sodium chloride infusion   Intravenous PRN Enzo Bi, MD 10 mL/hr at 06/20/22 2250  New Bag at 06/20/22 2250   acetaminophen (TYLENOL) tablet 650 mg  650 mg Oral Q6H PRN Andris Baumann, MD   650 mg at 06/21/22 2029   Or   acetaminophen (TYLENOL) suppository 650 mg  650 mg Rectal Q6H PRN Andris Baumann, MD       acetylcysteine (MUCOMYST) 20 % nebulizer / oral solution 4 mL  4 mL Nebulization BID [name], MD   4 mL at 06/22/22 1203   albuterol (PROVENTIL) (2.5 MG/3ML) 0.083% nebulizer solution 2.5 mg  2.5 mg Nebulization Q2H PRN Andris Baumann, MD       atorvastatin (LIPITOR) tablet 40 mg  40 mg Oral Daily Lindajo Royal V, MD   40 mg  at 06/22/22 0840   chlorpheniramine-HYDROcodone (TUSSIONEX) 10-8 MG/5ML suspension 5 mL  5 mL Oral Q6H PRN [name], MD   5 mL at 06/22/22 0840   enoxaparin (LOVENOX) injection 67.5 mg  0.5 mg/kg Subcutaneous Q24H Lindajo Royal V, MD   67.5 mg at 06/21/22 2300   guaiFENesin-dextromethorphan (ROBITUSSIN DM) 100-10 MG/5ML syrup 15 mL  15 mL Oral Q4H PRN Andris Baumann, MD   15 mL at 06/20/22 2231   HYDROcodone-acetaminophen (NORCO/VICODIN) 5-325 MG per tablet 1 tablet  1 tablet Oral Q6H PRN [name], MD   1 tablet at 06/22/22 0840   insulin aspart (novoLOG) injection 0-20 Units  0-20 Units Subcutaneous TID WC Lindajo Royal V, MD   7 Units at 06/22/22 1157   insulin aspart (novoLOG) injection 0-5 Units  0-5 Units Subcutaneous QHS Lindajo Royal V, MD       insulin aspart (novoLOG) injection 4 Units  4 Units Subcutaneous TID WC [name], MD   4 Units at 06/22/22 1157   insulin detemir (LEVEMIR) injection 20 Units  20 Units Subcutaneous BID [name], MD   20 Units at 06/22/22 0941   ipratropium-albuterol (DUONEB) 0.5-2.5 (3) MG/3ML nebulizer solution 3 mL  3 mL Nebulization QID [name], MD   3 mL at 06/22/22 1203   ketorolac (TORADOL) 15 MG/ML injection 15 mg  15 mg Intravenous Q6H PRN [name], MD   15 mg at 06/20/22 1336   ondansetron (ZOFRAN) tablet 4 mg  4 mg Oral Q6H PRN Andris Baumann, MD       Or   ondansetron [facility]) injection 4 mg  4 mg Intravenous Q6H PRN Andris Baumann, MD   4 mg at 06/21/22 1713   predniSONE (DELTASONE) tablet 40 mg  40 mg Oral Q breakfast [name], MD   40 mg at 06/22/22 0840     Discharge Medications: Please see discharge summary for a list of discharge medications.  Relevant Imaging Results:  Relevant Lab Results:   Additional Information SSN# [id number]  Kemper Durie, RN

## 2022-06-22 NOTE — Progress Notes (Signed)
PROGRESS NOTE    Cody Alexander  WUJ:[id number] DOB: August 06, 1956 DOA: 06/19/2022 PCP: Gildardo Pounds, PA  218A/[id number]-AA  LOS: 3 days   Brief hospital course:   Assessment & Plan: Cody Alexander is a 66 y.o. male with medical history significant for COPD on home O2 at 4 L, obesity, OSA, HTN, mild CAD on cardiac cath 02/2018, diastolic heart failure, last EF 60-65% 07/2021 morbid obesity, COPD with chronic respiratory failure on 4 L of oxygen, OSA, insulin-dependent diabetes mellitus, hypertension, chronic diastolic dysfunction CHF (EF of 60 - 65% in December 2022), chronic back pain, who presents to the ED following a syncopal event that occurred as he stood up to walk to the bathroom.  He fell backwards, striking his head on hardwood floor and was out for couple of minutes.   * Syncope and collapse Had 2 prior episodes.  Pt said there was no warning, but also said his legs gave out.   --Echo unremarkable.  Tele unremarkable.  Carotid US unremarkable.   --Orthostatic BP positive on 10/27, with systolic dropped from 121 lying, to 93 sitting, to 85 standing, with associated dizziness and leg buckling.  Orhtostasis resolved next day after gentle IVF hydration. Plan: --hold all BP meds    UTI (urinary tract infection) --reported some burning with voiding --completed 3 days of ceftriaxone  COPD with acute bronchitis (HCC) Chronic respiratory failure with hypoxia and hypercapnia on 4L O2 baseline VBG with normal pH and PCO2 of 80, baseline appears to be in the 70s CTA chest without evidence of pneumonia or edema or PE.  Pt likely has URI with bad cough.  RVP neg. Plan: --cont prednisone 40 mg daily for likely brondhitis --Schedule and as needed nebulized bronchodilator treatment --add mucomyst neb BID   CAD (coronary artery disease) Chest pain, atypical Elevated troponin chronic, 90's flat. CTA chest negative for PE Patient had left heart cath July 2019 that showed mild CAD   --repeat Echo no acute finding. --Continue aspirin, atorvastatin      Chronic diastolic CHF (congestive heart failure) (HCC) Chronic Lower extremity edema, otherwise mostly euvolemic --Hold home lisinopril, metoprolol and Imdur, furosemide and spironolactone due to orthostasis.  Leg weakness, bilateral --appeared to be a chronic problem.  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   OSA (obstructive sleep apnea) Pt refused bipap QHS  Obesity, Class III, BMI 40-49.9 (morbid obesity) (HCC) Complicating factor to overall prognosis and care   DVT prophylaxis: Lovenox SQ Code Status: Full code  Family Communication:  Level of care: Med-Surg Dispo:   The patient is from: home Anticipated d/c is to: SNF rehab Anticipated d/c date is: whenever bed available   Subjective and Interval History:  Pt still has a bad cough.    Orthostatic BP neg today, however, pt felt dizzy and weak upon standing, and exhibiting asterixis in his legs.       Objective: Vitals:   06/21/22 2159 06/21/22 2211 06/22/22 0421 06/22/22 0807  BP:  126/69 (!) 140/76 121/83  Pulse:  98 95 90  Resp:  17 18 18   Temp: 98.5 F (36.9 C) 98.5 F (36.9 C) 98.8 F (37.1 C) 97.8 F (36.6 C)  TempSrc:  Oral  Oral  SpO2:  94% 98% 94%  Weight:      Height:        Intake/Output Summary (Last 24 hours) at 06/22/2022 1338 Last data filed at 06/22/2022  1000 Gross per 24 hour  Intake 739.09 ml  Output 1050 ml  Net -310.91 ml   Filed Weights   06/19/22 1706 06/20/22 0045  Weight: 136.1 kg (!) 138.8 kg    Examination:   Constitutional: NAD, AAOx3 HEENT: conjunctivae and lids normal, EOMI CV: No cyanosis.   RESP: frequent cough Neuro: II - XII grossly intact.   Psych: Normal mood and affect.  Appropriate judgement and reason   Data Reviewed: I have personally reviewed labs and imaging studies  Time spent: 35 minutes  Enzo Bi, MD [facility] If 7PM-7AM, please contact night-coverage 06/22/2022, 1:38 PM

## 2022-06-23 DIAGNOSIS — R55 Syncope and collapse: Secondary | ICD-10-CM | POA: Diagnosis not present

## 2022-06-23 LAB — CBC
HCT: 40.6 % (ref 39.0–52.0)
Hemoglobin: 12.7 g/dL — ABNORMAL LOW (ref 13.0–17.0)
MCH: 30.1 pg (ref 26.0–34.0)
MCHC: 31.3 g/dL (ref 30.0–36.0)
MCV: 96.2 fL (ref 80.0–100.0)
Platelets: 240 10*3/uL (ref 150–400)
RBC: 4.22 MIL/uL (ref 4.22–5.81)
RDW: 12.9 % (ref 11.5–15.5)
WBC: 10 10*3/uL (ref 4.0–10.5)
nRBC: 0 % (ref 0.0–0.2)

## 2022-06-23 LAB — GLUCOSE, CAPILLARY
Glucose-Capillary: 112 mg/dL — ABNORMAL HIGH (ref 70–99)
Glucose-Capillary: 190 mg/dL — ABNORMAL HIGH (ref 70–99)
Glucose-Capillary: 285 mg/dL — ABNORMAL HIGH (ref 70–99)
Glucose-Capillary: 207 mg/dL — ABNORMAL HIGH (ref 70–99)

## 2022-06-23 LAB — BASIC METABOLIC PANEL
Anion gap: 7 (ref 5–15)
BUN: 27 mg/dL — ABNORMAL HIGH (ref 8–23)
CO2: 38 mmol/L — ABNORMAL HIGH (ref 22–32)
Calcium: 8.8 mg/dL — ABNORMAL LOW (ref 8.9–10.3)
Chloride: 94 mmol/L — ABNORMAL LOW (ref 98–111)
Creatinine, Ser: 1.33 mg/dL — ABNORMAL HIGH (ref 0.61–1.24)
GFR, Estimated: 59 mL/min — ABNORMAL LOW (ref >60)
Glucose, Bld: 111 mg/dL — ABNORMAL HIGH (ref 70–99)
Potassium: 3.6 mmol/L (ref 3.5–5.1)
Sodium: 139 mmol/L (ref 135–145)

## 2022-06-23 LAB — MAGNESIUM: Magnesium: 2.3 mg/dL (ref 1.7–2.4)

## 2022-06-23 MED ORDER — METOPROLOL TARTRATE 25 MG PO TABS
25.0000 mg | ORAL_TABLET | Freq: Two times a day (BID) | ORAL | Status: DC
  Administered 2022-06-23 – 2022-06-25 (×4): 25 mg via ORAL
  Filled 2022-06-23 (×4): qty 1

## 2022-06-23 MED ORDER — OXYCODONE-ACETAMINOPHEN 5-325 MG PO TABS
1.0000 | ORAL_TABLET | Freq: Four times a day (QID) | ORAL | Status: DC | PRN
  Administered 2022-06-23 – 2022-06-24 (×3): 1 via ORAL
  Filled 2022-06-23 (×4): qty 1

## 2022-06-23 NOTE — Progress Notes (Signed)
PROGRESS NOTE    Cody Alexander  [id number]:[id number] DOB: [birth year] DOA: 06/19/2022 PCP: Rutherford Limerick, PA  [id number]-AA  LOS: 4 days   Brief hospital course:   Assessment & Plan: Cody Alexander is a 66 y.o. male with medical history significant for COPD on home O2 at 4 L, obesity, OSA, HTN, mild CAD on cardiac cath 12/5730, diastolic heart failure, last EF 60-65% 07/2021 morbid obesity, COPD with chronic respiratory failure on 4 L of oxygen, OSA, insulin-dependent diabetes mellitus, hypertension, chronic diastolic dysfunction CHF (EF of 60 - 65% in December 2022), chronic back pain, who presents to the ED following a syncopal event that occurred as he stood up to walk to the bathroom.  He fell backwards, striking his head on hardwood floor and was out for couple of minutes.   * Syncope and collapse Had 2 prior episodes.  Pt said there was no warning, but also said his legs gave out.   --Echo unremarkable.  Tele unremarkable.  Carotid US unremarkable.   --Orthostatic BP positive on 20/25, with systolic dropped from 427 lying, to 93 sitting, to 85 standing, with associated dizziness and leg buckling.  Orhtostasis resolved next day after gentle IVF hydration.    UTI (urinary tract infection) --reported some burning with voiding.  Urine cx pos for pan-sensitive E coli. --completed 3 days of ceftriaxone  COPD with acute bronchitis (HCC) Chronic respiratory failure with hypoxia and hypercapnia on 4L O2 baseline VBG with normal pH and PCO2 of 80, baseline appears to be in the 70s CTA chest without evidence of pneumonia or edema or PE.  Pt likely has URI with bad cough.  RVP neg. Plan: --cont prednisone 40 mg daily for likely brondhitis --Schedule and as needed nebulized bronchodilator treatment --cont mucomyst neb BID   CAD (coronary artery disease) Chest pain, atypical Elevated troponin chronic, 90's flat. CTA chest negative for PE Patient had left heart cath July 2019 that  showed mild CAD  --repeat Echo no acute finding. --Continue aspirin, atorvastatin      Chronic diastolic CHF (congestive heart failure) (HCC) Chronic Lower extremity edema, otherwise mostly euvolemic --Hold home lisinopril, Imdur, furosemide and spironolactone due to orthostasis. --resume home metop today  Leg weakness, bilateral --appeared to be a chronic problem.  Pt walks with a cane or no assistance PTA --PT found upon standing, random jerking of legs and arms, suggesting asterixis --curb-side discussed with neuro, unlikely to have generalized asterixis from stroke or neurological defect. --rec SNF rehab  Essential hypertension Hold amlodipine, lisinopril, Lasix and aldactone due to orthostasis --resume metop today  Type 2 diabetes mellitus with hyperglycemia, with long-term current use of insulin (HCC) --Levemir 20u BID --mealtime 4u TID --SSI  Chronic back pain Chronic bilateral leg weakness Patient was hospitalized in July for acute on chronic low back pain MRI showed disc protrusion at L4-L5 transverse left L5 nerve root, mild to moderate left and mild right neural foraminal stenosis, multilevel degenerative disc disease of lumbar spine.   OSA (obstructive sleep apnea) Pt refused bipap QHS  Obesity, Class III, BMI 40-49.9 (morbid obesity) (HCC) Complicating factor to overall prognosis and care   DVT prophylaxis: Lovenox SQ Code Status: Full code  Family Communication:  Level of care: Med-Surg Dispo:   The patient is from: home Anticipated d/c is to: SNF rehab Anticipated d/c date is: whenever bed available   Subjective and Interval History:  Continued to have severe cough causing chest pain.        Objective: Vitals:  06/23/22 0143 06/23/22 0434 06/23/22 0835 06/23/22 1611  BP:  (!) 140/107 (!) 137/90 (!) 151/93  Pulse:  79 79 79  Resp:  18 19 18   Temp:  98.2 F (36.8 C) (!) 97.2 F (36.2 C) 97.8 F (36.6 C)  TempSrc:   Oral   SpO2: 95% 94% (!)  88% 94%  Weight:      Height:        Intake/Output Summary (Last 24 hours) at 06/23/2022 1622 Last data filed at 06/23/2022 1622 Gross per 24 hour  Intake 480 ml  Output 2260 ml  Net -1780 ml   Filed Weights   06/19/22 1706 06/20/22 0045  Weight: 136.1 kg (!) 138.8 kg    Examination:   Constitutional: NAD, AAOx3 HEENT: conjunctivae and lids normal, EOMI CV: No cyanosis.   RESP: frequent prolonged cough Neuro: II - XII grossly intact.   Psych: Normal mood and affect.  Appropriate judgement and reason   Data Reviewed: I have personally reviewed labs and imaging studies  Time spent: 35 minutes  Enzo Bi, MD [facility] If 7PM-7AM, please contact night-coverage 06/23/2022, 4:22 PM

## 2022-06-24 DIAGNOSIS — R55 Syncope and collapse: Secondary | ICD-10-CM | POA: Diagnosis not present

## 2022-06-24 LAB — BASIC METABOLIC PANEL
Anion gap: 9 (ref 5–15)
BUN: 26 mg/dL — ABNORMAL HIGH (ref 8–23)
CO2: 38 mmol/L — ABNORMAL HIGH (ref 22–32)
Calcium: 9 mg/dL (ref 8.9–10.3)
Chloride: 95 mmol/L — ABNORMAL LOW (ref 98–111)
Creatinine, Ser: 1.1 mg/dL (ref 0.61–1.24)
GFR, Estimated: >60 mL/min (ref >60)
Glucose, Bld: 103 mg/dL — ABNORMAL HIGH (ref 70–99)
Potassium: 3.5 mmol/L (ref 3.5–5.1)
Sodium: 142 mmol/L (ref 135–145)

## 2022-06-24 LAB — CBC
HCT: 40.7 % (ref 39.0–52.0)
Hemoglobin: 12.8 g/dL — ABNORMAL LOW (ref 13.0–17.0)
MCH: 29.2 pg (ref 26.0–34.0)
MCHC: 31.4 g/dL (ref 30.0–36.0)
MCV: 92.7 fL (ref 80.0–100.0)
Platelets: 247 10*3/uL (ref 150–400)
RBC: 4.39 MIL/uL (ref 4.22–5.81)
RDW: 12.8 % (ref 11.5–15.5)
WBC: 9.4 10*3/uL (ref 4.0–10.5)
nRBC: 0 % (ref 0.0–0.2)

## 2022-06-24 LAB — GLUCOSE, CAPILLARY
Glucose-Capillary: 125 mg/dL — ABNORMAL HIGH (ref 70–99)
Glucose-Capillary: 201 mg/dL — ABNORMAL HIGH (ref 70–99)
Glucose-Capillary: 241 mg/dL — ABNORMAL HIGH (ref 70–99)
Glucose-Capillary: 180 mg/dL — ABNORMAL HIGH (ref 70–99)

## 2022-06-24 LAB — MAGNESIUM: Magnesium: 2.2 mg/dL (ref 1.7–2.4)

## 2022-06-24 MED ORDER — OXYCODONE-ACETAMINOPHEN 5-325 MG PO TABS: 1.0000 | ORAL_TABLET | Freq: Three times a day (TID) | ORAL | Status: DC | PRN

## 2022-06-24 MED ORDER — FUROSEMIDE 40 MG PO TABS
40.0000 mg | ORAL_TABLET | Freq: Every day | ORAL | Status: DC
  Administered 2022-06-24 – 2022-06-25 (×2): 40 mg via ORAL
  Filled 2022-06-24 (×2): qty 1

## 2022-06-24 MED ORDER — SPIRONOLACTONE 12.5 MG HALF TABLET
12.5000 mg | ORAL_TABLET | Freq: Every day | ORAL | Status: DC
  Administered 2022-06-24 – 2022-06-25 (×2): 12.5 mg via ORAL
  Filled 2022-06-24 (×2): qty 1

## 2022-06-24 MED ORDER — BUTALBITAL-APAP-CAFFEINE 50-325-40 MG PO TABS: 2.0000 | ORAL_TABLET | Freq: Four times a day (QID) | ORAL | Status: DC | PRN

## 2022-06-24 MED ORDER — IPRATROPIUM-ALBUTEROL 0.5-2.5 (3) MG/3ML IN SOLN
3.0000 mL | Freq: Four times a day (QID) | RESPIRATORY_TRACT | Status: DC
  Administered 2022-06-24 – 2022-06-25 (×2): 3 mL via RESPIRATORY_TRACT
  Filled 2022-06-24 (×3): qty 3

## 2022-06-24 MED ORDER — BUTALBITAL-APAP-CAFFEINE 50-325-40 MG PO TABS
2.0000 | ORAL_TABLET | Freq: Once | ORAL | Status: AC
  Administered 2022-06-24: 2 via ORAL
  Filled 2022-06-24: qty 2

## 2022-06-24 NOTE — Progress Notes (Signed)
PROGRESS NOTE    Cody Alexander  [id number] DOB: 1955-11-14 DOA: 06/19/2022 PCP: Rutherford Limerick, PA  [id number]  LOS: 5 days   Brief hospital course:   Assessment & Plan: Cody Alexander is a 66 y.o. male with medical history significant for COPD on home O2 at 4 L, obesity, OSA, HTN, mild CAD on cardiac cath 09/7251, diastolic heart failure, last EF 60-65% 07/2021 morbid obesity, COPD with chronic respiratory failure on 4 L of oxygen, OSA, insulin-dependent diabetes mellitus, hypertension, chronic diastolic dysfunction CHF (EF of 60 - 65% in December 2022), chronic back pain, who presents to the ED following a syncopal event that occurred as he stood up to walk to the bathroom.  He fell backwards, striking his head on hardwood floor and was out for couple of minutes.   * Syncope and collapse Had 2 prior episodes.  Pt said there was no warning, but also said his legs gave out.   --Echo unremarkable.  Tele unremarkable.  Carotid US unremarkable.   --Orthostatic BP positive on 66/44, with systolic dropped from 034 lying, to 93 sitting, to 85 standing, with associated dizziness and leg buckling.  Orhtostasis resolved next day after gentle IVF hydration.    UTI (urinary tract infection) --reported some burning with voiding.  Urine cx pos for pan-sensitive E coli. --completed 3 days of ceftriaxone  COPD with acute bronchitis (HCC) Chronic respiratory failure with hypoxia and hypercapnia on 4L O2 baseline VBG with normal pH and PCO2 of 80, baseline appears to be in the 70s CTA chest without evidence of pneumonia or edema or PE.  Pt likely has URI with bad cough.  RVP neg. Plan: --cont prednisone 40 mg daily for likely brondhitis --Schedule and as needed nebulized bronchodilator treatment --cont mucomyst neb BID   CAD (coronary artery disease) Chest pain, atypical Elevated troponin chronic, 90's flat. CTA chest negative for PE Patient had left heart cath July 2019 that  showed mild CAD  --repeat Echo no acute finding. --Continue aspirin, atorvastatin      Chronic diastolic CHF (congestive heart failure) (HCC) Chronic Lower extremity edema, otherwise mostly euvolemic --home lisinopril, Imdur, furosemide and spironolactone held on presentation due to orthostasis. Plan: --cont home metop  --resume home Lasix and aldactone today  Leg weakness, bilateral --appeared to be a chronic problem.  Pt walks with a cane or no assistance PTA --PT found upon standing, random jerking of legs and arms, suggesting asterixis --curb-side discussed with neuro, unlikely to have generalized asterixis from stroke or neurological defect. 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   OSA (obstructive sleep apnea) Pt refused bipap QHS  Obesity, Class III, BMI 40-49.9 (morbid obesity) (HCC) Complicating factor to overall prognosis and care   DVT prophylaxis: Lovenox SQ Code Status: Full code  Family Communication:  Level of care: Med-Surg Dispo:   The patient is from: home Anticipated d/c is to: pt declined SNF rehab Anticipated d/c date is: tomorrow   Subjective and Interval  History:  Pt complained of persistent headache.  Reported BLE swelling improved although pt has had no diuretic since presentation.    Objective: Vitals:   06/24/22 0510 06/24/22 0643 06/24/22 0733 06/24/22  1505  BP: (!) 152/103 (!) 171/99 (!) 168/104 (!) 168/112  Pulse: 72 76 74 (!) 59  Resp: 16 20 18 18   Temp: 97.9 F (36.6 C) 97.6 F (36.4 C) 97.6 F (36.4 C) 98 F (36.7 C)  TempSrc:  Oral Oral Oral  SpO2: 94% 94% 94% 94%  Weight:      Height:        Intake/Output Summary (Last 24 hours) at 06/24/2022 1800 Last data filed at 06/24/2022 1500 Gross per 24 hour  Intake 820 ml  Output 875 ml  Net -55 ml   Filed Weights   06/19/22 1706 06/20/22 0045  Weight: 136.1 kg (!) 138.8 kg    Examination:   Constitutional: NAD, AAOx3 HEENT: conjunctivae and lids normal, EOMI CV: No cyanosis.   RESP: normal respiratory effort, still coughing frequently, on RA Extremities: improved edema in BLE SKIN: warm, dry Neuro: II - XII grossly intact.   Psych: Normal mood and affect.  Appropriate judgement and reason   Data Reviewed: I have personally reviewed labs and imaging studies  Time spent: 35 minutes  06/22/22, MD [facility] If 7PM-7AM, please contact night-coverage 06/24/2022, 6:00 PM

## 2022-06-24 NOTE — TOC Progression Note (Signed)
Transition of Care [facility]) - Progression Note    Patient Details  Name: Cody Alexander MRN: [id number] Date of Birth: 30-Nov-1955  [facility] of [facility]) CM/SW Contact  Beverly Sessions, RN Phone Number: 06/24/2022, 4:17 PM  Clinical Narrative:    Met with patient at bedside to present offers Patient states that he is no longer interested in SNF, and plans to move forward home with home health services through [facility]   Last week patient declined for a portable O2 tank to be delivered to the room for transport at discharge.  Followed up again with patient, and he again confirms he does not want a tank delivered.  MD Notified   Patient requesting cab voucher at discharge PT to see patient again tomorrow  Notified patient of safety concerns transporting without O2 via cab.  TOC supervisor notified     Expected Discharge Plan: [place] Barriers to Discharge: Continued Medical Work up  Expected Discharge Plan and Services Expected Discharge Plan: [place] Choice: [place] arrangements for the past 2 months: Single Family Home                                       Social Determinants of Health (SDOH) Interventions Housing Interventions: Intervention Not Indicated  Readmission Risk Interventions     No data to display

## 2022-06-24 NOTE — Progress Notes (Signed)
Patient states he does not want to wear CPAP unit tonight. Unit remains at bedside.

## 2022-06-24 NOTE — Care Management Important Message (Signed)
Important Message  Patient Details  Name: Cody Alexander MRN: [id number] Date of Birth: [birth year]   Medicare Important Message Given:  Yes     Dannette Barbara 06/24/2022, 11:58 AM

## 2022-06-24 NOTE — Plan of Care (Signed)
  Problem: Respiratory: Goal: Ability to maintain a clear airway will improve Outcome: Progressing Goal: Levels of oxygenation will improve Outcome: Progressing   Problem: Nutrition: Goal: Adequate nutrition will be maintained Outcome: Progressing   Problem: Coping: Goal: Level of anxiety will decrease Outcome: Progressing   Problem: Safety: Goal: Ability to remain free from injury will improve Outcome: Progressing   Problem: Skin Integrity: Goal: Risk for impaired skin integrity will decrease Outcome: Progressing

## 2022-06-25 LAB — CBC
HCT: 41.4 % (ref 39.0–52.0)
Hemoglobin: 12.8 g/dL — ABNORMAL LOW (ref 13.0–17.0)
MCH: 28.6 pg (ref 26.0–34.0)
MCHC: 30.9 g/dL (ref 30.0–36.0)
MCV: 92.6 fL (ref 80.0–100.0)
Platelets: 268 10*3/uL (ref 150–400)
RBC: 4.47 MIL/uL (ref 4.22–5.81)
RDW: 12.7 % (ref 11.5–15.5)
WBC: 9.7 10*3/uL (ref 4.0–10.5)
nRBC: 0 % (ref 0.0–0.2)

## 2022-06-25 LAB — BASIC METABOLIC PANEL
Anion gap: 6 (ref 5–15)
BUN: 24 mg/dL — ABNORMAL HIGH (ref 8–23)
CO2: 41 mmol/L — ABNORMAL HIGH (ref 22–32)
Calcium: 8.9 mg/dL (ref 8.9–10.3)
Chloride: 96 mmol/L — ABNORMAL LOW (ref 98–111)
Creatinine, Ser: 0.98 mg/dL (ref 0.61–1.24)
GFR, Estimated: >60 mL/min (ref >60)
Glucose, Bld: 105 mg/dL — ABNORMAL HIGH (ref 70–99)
Potassium: 3.6 mmol/L (ref 3.5–5.1)
Sodium: 143 mmol/L (ref 135–145)

## 2022-06-25 LAB — MAGNESIUM: Magnesium: 2.2 mg/dL (ref 1.7–2.4)

## 2022-06-25 LAB — GLUCOSE, CAPILLARY
Glucose-Capillary: 103 mg/dL — ABNORMAL HIGH (ref 70–99)
Glucose-Capillary: 214 mg/dL — ABNORMAL HIGH (ref 70–99)

## 2022-06-25 MED ORDER — HYDROXYZINE HCL 10 MG PO TABS: 10.0000 mg | ORAL_TABLET | Freq: Three times a day (TID) | ORAL | 0 refills | Status: DC | PRN

## 2022-06-25 MED ORDER — IPRATROPIUM-ALBUTEROL 0.5-2.5 (3) MG/3ML IN SOLN: 3.0000 mL | Freq: Four times a day (QID) | RESPIRATORY_TRACT | 1 refills | Status: DC | PRN

## 2022-06-25 MED ORDER — ISOSORBIDE DINITRATE 30 MG PO TABS: 30.0000 mg | ORAL_TABLET | Freq: Every morning | ORAL | Status: DC

## 2022-06-25 MED ORDER — INSULIN DETEMIR 100 UNIT/ML FLEXPEN: 30.0000 [IU] | PEN_INJECTOR | Freq: Two times a day (BID) | SUBCUTANEOUS | 11 refills | Status: DC

## 2022-06-25 MED ORDER — IBUPROFEN 400 MG PO TABS
600.0000 mg | ORAL_TABLET | Freq: Once | ORAL | Status: AC
  Administered 2022-06-25: 600 mg via ORAL
  Filled 2022-06-25: qty 2

## 2022-06-25 MED ORDER — FUROSEMIDE 80 MG PO TABS: 80.0000 mg | ORAL_TABLET | Freq: Every day | ORAL | Status: DC

## 2022-06-25 MED ORDER — HYDROCOD POLI-CHLORPHE POLI ER 10-8 MG/5ML PO SUER: 5.0000 mL | Freq: Four times a day (QID) | ORAL | 0 refills | Status: AC | PRN

## 2022-06-25 MED ORDER — ACETAMINOPHEN 500 MG PO TABS
1000.0000 mg | ORAL_TABLET | Freq: Once | ORAL | Status: AC
  Administered 2022-06-25: 1000 mg via ORAL
  Filled 2022-06-25: qty 2

## 2022-06-25 MED ORDER — LISINOPRIL 20 MG PO TABS
20.0000 mg | ORAL_TABLET | Freq: Every day | ORAL | Status: DC
  Administered 2022-06-25: 20 mg via ORAL
  Filled 2022-06-25: qty 1

## 2022-06-25 MED ORDER — HYDROCOD POLI-CHLORPHE POLI ER 10-8 MG/5ML PO SUER: 5.0000 mL | Freq: Four times a day (QID) | ORAL | 0 refills | Status: DC | PRN

## 2022-06-25 NOTE — TOC Transition Note (Signed)
Transition of Care [facility]) - CM/SW Discharge Note   Patient Details  Name: Cody Alexander MRN: [id number] Date of Birth: [birth year]  Transition of Care [facility]) CM/SW Contact:  Beverly Sessions, RN Phone Number: 06/25/2022, 10:20 AM   Clinical Narrative:     Patient to discharge today.  [place] with [place] notified of discharge Cab voucher completed and printed to unit  Patient has been educated on risk of transporting via cab with out O2.  Patient still would like to proceed. MD, bedside RN, TOC supervisor, and unit director aware  Patient declined PT session this morning    Barriers to Discharge: Continued Medical Work up   Patient Goals and CMS Choice   CMS Medicare.gov Compare Post Acute Care list provided to:: Patient    Discharge Placement                       Discharge Plan and Services     Post Acute Care Choice: Home Health                               Social Determinants of Health (SDOH) Interventions Housing Interventions: Intervention Not Indicated   Readmission Risk Interventions    06/25/2022   10:13 AM  Readmission Risk Prevention Plan  Transportation Screening Complete  PCP or Specialist Appt within 3-5 Days Complete  HRI or [place] Complete  Social Work Consult for [place] Planning/Counseling Complete  Palliative Care Screening Not Applicable  Medication Review [employer]) Complete

## 2022-06-25 NOTE — Plan of Care (Signed)
  Problem: Education: Goal: Knowledge of disease or condition will improve Outcome: Completed/Met Goal: Knowledge of the prescribed therapeutic regimen will improve Outcome: Completed/Met Goal: Individualized Educational Video(s) Outcome: Completed/Met   Problem: Education: Goal: Knowledge of disease or condition will improve Outcome: Completed/Met Goal: Knowledge of the prescribed therapeutic regimen will improve Outcome: Completed/Met Goal: Individualized Educational Video(s) Outcome: Completed/Met   Problem: Activity: Goal: Ability to tolerate increased activity will improve Outcome: Completed/Met Goal: Will verbalize the importance of balancing activity with adequate rest periods Outcome: Completed/Met   Problem: Respiratory: Goal: Ability to maintain a clear airway will improve Outcome: Completed/Met Goal: Levels of oxygenation will improve Outcome: Completed/Met Goal: Ability to maintain adequate ventilation will improve Outcome: Completed/Met

## 2022-06-25 NOTE — Discharge Summary (Signed)
Physician Discharge Summary   Cody Alexander  male DOB: 09-15-1955  ZOX:[id number]  PCP: Gildardo Pounds, PA  Admit date: 06/19/2022 Discharge date: 06/25/2022  Admitted From: home Disposition:  home (pt declined SNF rehab) Home Health: Yes CODE STATUS: Full code  Discharge Instructions     Diet - low sodium heart healthy   Complete by: As directed    Discharge instructions   Complete by: As directed    Since you came in with low blood pressure, I have stopped your amlodipine, and reduced your lasix from 80 mg twice a day to 80 mg daily.    Decreased your Levemir from 50 units twice a day to 30 units twice a day.  Please take your Symbicort, and I have prescribed you DuoNeb breathing treatment to take as needed.  Please keep a low sodium diet, which will help with your leg swelling.   [name] Course:  For full details, please see H&P, progress notes, consult notes and ancillary notes.  Briefly,  Cody Alexander is a 66 y.o. male with medical history significant for COPD on home 4L O2, OSA, HTN, mild CAD on cardiac cath 02/2018, diastolic heart failure, morbid obesity, insulin-dependent diabetes mellitus, hypertension, chronic back pain, who presented to the ED following a syncopal event that occurred as he stood up to walk to the bathroom.  He fell backwards, striking his head on hardwood floor and was out for couple of minutes.   * Syncope and collapse 2/2 Orthostasis Reportedly had 2 prior episodes.  Pt said there was no warning, but also said his legs gave out.   --Echo unremarkable.  Tele unremarkable.  Carotid US unremarkable.   --Orthostatic BP positive on 10/27, with systolic dropped from 121 lying, to 93 sitting, to 85 standing, with associated dizziness and leg buckling.  Orhtostasis resolved next day after gentle IVF hydration. --home BP medication were held on presentation, and resumed 1 by 1 as BP started to trend up.   UTI  (urinary tract infection) --reported some burning with voiding.  Urine cx pos for pan-sensitive E coli. --completed 3 days of ceftriaxone   COPD with acute bronchitis (HCC) Chronic respiratory failure with hypoxia and hypercapnia on 4L O2 baseline VBG with normal pH and PCO2 of 80, baseline appears to be in the 70s CTA chest without evidence of pneumonia or edema or PE.  Pt likely has URI with bad cough.  RVP neg. --received 6 days of prednisone for likely brondhitis --Schedule and as needed nebulized bronchodilator treatment --received mucomyst neb BID for mucus clearance. --discharged on home Symbicort, and DuoNeb PRN   CAD (coronary artery disease) Chest pain, atypical Elevated troponin chronic, 90's flat. CTA chest negative for PE Patient had left heart cath July 2019 that showed mild CAD  --repeat Echo no acute finding. --Continue atorvastatin    Chronic diastolic CHF (congestive heart failure) (HCC) Chronic Lower extremity edema, otherwise mostly euvolemic --home lisinopril, Imdur, furosemide and spironolactone held on presentation due to orthostasis. --cont home metop  --home lasix reduced from 80 mg BID to 80 mg daily.   --Isordil, Lisinopril, spironolactone resumed prior to discharge.   Leg weakness, bilateral --appeared to be a chronic problem.  Pt walks with a cane or no assistance PTA --PT found upon standing, random jerking of legs and arms, suggesting asterixis --curb-side discussed with neuro, unlikely to have generalized asterixis from stroke or neurological defect. --PT rec SNF rehab, search  started, however, pt later declined.   Essential hypertension --home amlodipine, lisinopril, Lasix and aldactone held on presentation due to orthostasis --cont home metop  --home lasix reduced from 80 mg BID to 80 mg daily.   --Isordil, Lisinopril, spironolactone resumed prior to discharge. --home amlodipine d/c'ed   Type 2 diabetes mellitus with hyperglycemia, with  long-term current use of insulin (HCC) --pt received Levemir 20u BID, mealtime 4u TID and SSI while inpatient. --Pt reported taking Levemir 50u BID PTA, which seems too much, so Levemir was reduced to 30u BID. --discharged back on mealtime 16u TID since pt admitted to eating much more at home. --resume metformin after discharge.   Chronic back pain Chronic bilateral leg weakness Patient was hospitalized in July for acute on chronic low back pain MRI showed disc protrusion at L4-L5 transverse left L5 nerve root, mild to moderate left and mild right neural foraminal stenosis, multilevel degenerative disc disease of lumbar spine.    OSA (obstructive sleep apnea) Pt refused bipap QHS   Obesity, Class III, BMI 40-49.9 (morbid obesity) (HCC) Complicating factor to overall prognosis and care   Discharge Diagnoses:  Principal Problem:   Syncope and collapse Active Problems:   COPD with acute bronchitis (HCC)   UTI (urinary tract infection)   Chronic diastolic CHF (congestive heart failure) (HCC)   CAD (coronary artery disease)   Elevated troponin   Essential hypertension   Leg weakness, bilateral   Type 2 diabetes mellitus with hyperglycemia, with long-term current use of insulin (HCC)   Obesity, Class III, BMI 40-49.9 (morbid obesity) (HCC)   OSA (obstructive sleep apnea)   Chronic respiratory failure with hypoxia (HCC)   Chest pain   Chronic back pain   30 Day Unplanned Readmission Risk Score    Flowsheet Row ED to Hosp-Admission (Current) from [facility]  30 Day Unplanned Readmission Risk Score (%) 27.3 Filed at 06/25/2022 0801       This score is the patient's risk of an unplanned readmission within 30 days of being discharged (0 -100%). The score is based on dignosis, age, lab data, medications, orders, and past utilization.   Low:  0-14.9   Medium: 15-21.9   High: 22-29.9   Extreme: 30 and above         Discharge  Instructions:  Allergies as of 06/25/2022       Reactions   Erythromycin Anaphylaxis, Swelling   Had eye swelling with erythromycin during a time when he had perf ear drum. Tolerated azithromycin.        Medication List     STOP taking these medications    amLODipine 10 MG tablet Commonly known as: NORVASC       TAKE these medications    acetaminophen 500 MG tablet Commonly known as: TYLENOL Take 1,000 mg by mouth every 6 (six) hours as needed for mild pain or moderate pain.   atorvastatin 40 MG tablet Commonly known as: LIPITOR Take 40 mg by mouth daily.   budesonide-formoterol 160-4.5 MCG/ACT inhaler Commonly known as: SYMBICORT Inhale 2 puffs into the lungs 2 (two) times daily.   chlorpheniramine-HYDROcodone 10-8 MG/5ML Commonly known as: TUSSIONEX Take 5 mLs by mouth every 6 (six) hours as needed for up to 7 days for cough.   furosemide 80 MG tablet Commonly known as: LASIX Take 1 tablet (80 mg total) by mouth daily. What changed: when to take this   gabapentin 600 MG tablet Commonly known as: NEURONTIN Take 600  mg by mouth 3 (three) times daily.   hydrOXYzine 10 MG tablet Commonly known as: ATARAX Take 1 tablet (10 mg total) by mouth 3 (three) times daily as needed. Home med. What changed:  when to take this reasons to take this additional instructions   insulin detemir 100 UNIT/ML FlexPen Commonly known as: LEVEMIR Inject 30 Units into the skin in the morning and at bedtime. What changed: how much to take   insulin lispro 100 UNIT/ML KwikPen Commonly known as: HUMALOG Inject 16 Units into the skin 3 (three) times daily with meals.   ipratropium-albuterol 0.5-2.5 (3) MG/3ML Soln Commonly known as: DUONEB Take 3 mLs by nebulization every 6 (six) hours as needed.   isosorbide dinitrate 30 MG tablet Commonly known as: ISORDIL Take 30 mg by mouth every morning.   lisinopril 20 MG tablet Commonly known as: ZESTRIL Take 20 mg by mouth  daily.   metFORMIN 1000 MG tablet Commonly known as: GLUCOPHAGE Take 1,000 mg by mouth 2 (two) times daily.   metoprolol tartrate 25 MG tablet Commonly known as: LOPRESSOR Take 25 mg by mouth 2 (two) times daily.   ProAir HFA 108 (90 Base) MCG/ACT inhaler Generic drug: albuterol Inhale 2 puffs into the lungs every 4 (four) hours as needed for wheezing.   spironolactone 25 MG tablet Commonly known as: ALDACTONE Take 12.5 mg by mouth daily.   terbinafine 250 MG tablet Commonly known as: LAMISIL Take 250 mg by mouth daily.   traMADol 50 MG tablet Commonly known as: ULTRAM Take 50 mg by mouth 2 (two) times daily as needed.         Follow-up Information     Unknown Foley A, PA Follow up in 1 week(s).   Specialty: Physician Assistant Contact information: [address] [place] [address] [phone]                 Allergies  Allergen Reactions   Erythromycin Anaphylaxis and Swelling    Had eye swelling with erythromycin during a time when he had perf ear drum.  Tolerated azithromycin.     The results of significant diagnostics from this hospitalization (including imaging, microbiology, ancillary and laboratory) are listed below for reference.   Consultations:   Procedures/Studies: US Abdomen Limited RUQ (LIVER/GB)  Result Date: 06/22/2022 CLINICAL DATA:  Pain. EXAM: ULTRASOUND ABDOMEN LIMITED RIGHT UPPER QUADRANT COMPARISON:  None Available. FINDINGS: Gallbladder: No gallstones or wall thickening visualized. No sonographic Murphy sign noted by sonographer. Common bile duct: Diameter: 6 mm Liver: No focal lesion identified. Increased in parenchymal echogenicity. Portal vein is patent on color Doppler imaging with normal direction of blood flow towards the liver. Other: None. IMPRESSION: Hepatic steatosis. Please note limited evaluation for focal hepatic masses in a patient with hepatic steatosis due to decreased penetration of the acoustic ultrasound  waves. Electronically Signed   By: Darliss Cheney M.D.   On: 06/22/2022 19:55   DG Knee 1-2 Views Left  Result Date: 06/20/2022 CLINICAL DATA:  Patient fell today. Left knee pain with bump laterally. EXAM: LEFT KNEE - 1-2 VIEW COMPARISON:  None Available. FINDINGS: Degenerative changes in the left knee with medial greater than lateral compartment narrowing and moderate osteophyte formation in all 3 compartments. There is a small left knee effusion. No evidence of acute fracture or dislocation. No focal bone lesion or bone destruction. IMPRESSION: Moderate tricompartment degenerative changes in the left knee. Small left knee effusion. No acute fractures. Electronically Signed   By: Burman Nieves  M.D.   On: 06/20/2022 21:15   DG Knee 1-2 Views Right  Result Date: 06/20/2022 CLINICAL DATA:  Fall. EXAM: RIGHT KNEE - 1-2 VIEW COMPARISON:  None Available. FINDINGS: There is no acute fracture or dislocation. The bones are well mineralized. There is arthritic changes of the knee with moderate narrowing of the medial compartment. No joint effusion. The soft tissues are unremarkable. IMPRESSION: 1. No acute fracture or dislocation. 2. Arthritic changes of the knee. Electronically Signed   By: Elgie Collard M.D.   On: 06/20/2022 21:09   US Carotid Bilateral  Result Date: 06/20/2022 CLINICAL DATA:  Syncope and collapse. History of CAD, hypertension, hyperlipidemia and diabetes. EXAM: BILATERAL CAROTID DUPLEX ULTRASOUND TECHNIQUE: Wallace Cullens scale imaging, color Doppler and duplex ultrasound were performed of bilateral carotid and vertebral arteries in the neck. COMPARISON:  None Available. FINDINGS: Criteria: Quantification of carotid stenosis is based on velocity parameters that correlate the residual internal carotid diameter with NASCET-based stenosis levels, using the diameter of the distal internal carotid lumen as the denominator for stenosis measurement. The following velocity measurements were obtained:  RIGHT ICA: 78/19 cm/sec CCA: 81/9 cm/sec SYSTOLIC ICA/CCA RATIO:  1.0 ECA: 176 cm/sec LEFT ICA: 73/11 cm/sec CCA: 109/10 cm/sec SYSTOLIC ICA/CCA RATIO:  0.7 ECA: 156 cm/sec RIGHT CAROTID ARTERY: There is a very minimal amount of eccentric mixed echogenic plaque within the right carotid bulb (image 14), extending to involve the origin and proximal aspects of the right internal carotid artery (image 22), not resulting in elevated peak systolic velocities within the interrogated course of the right internal carotid artery to suggest a hemodynamically significant stenosis. RIGHT VERTEBRAL ARTERY:  Antegrade flow LEFT CAROTID ARTERY: There is a very minimal amount of eccentric echogenic plaque within the left carotid bulb (image 47), extending to involve the origin and proximal aspects of the left internal carotid artery (image 54), not resulting in elevated peak systolic velocities within the interrogated course of the left internal carotid artery to suggest a hemodynamically significant stenosis. LEFT VERTEBRAL ARTERY:  Antegrade flow IMPRESSION: Very minimal amount of bilateral atherosclerotic plaque, not resulting in a hemodynamically significant stenosis within either internal carotid artery. Electronically Signed   By: Simonne Come M.D.   On: 06/20/2022 16:36   ECHOCARDIOGRAM COMPLETE  Result Date: 06/20/2022    ECHOCARDIOGRAM REPORT   Patient Name:   WERNER LABELLA Date of Exam: 06/20/2022 Medical Rec #:  [id number]         Height:       73.0 in Accession #:    [id number]        Weight:       306.0 lb Date of Birth:  1955/09/02         BSA:          2.578 m Patient Age:    66 years          BP:           110/65 mmHg Patient Gender: M                 HR:           69 bpm. Exam Location:  [facility] Procedure: 2D Echo, Cardiac Doppler and Color Doppler Indications:     Syncope R55  History:         Patient has prior history of Echocardiogram examinations, most                  recent 08/16/2021. CHF, COPD; Risk  Factors:Diabetes and  Hypertension.  Sonographer:     Sherrie Sport Referring Phys:  [id number] Cameron Diagnosing Phys: Serafina Royals MD  Sonographer Comments: Technically challenging study due to limited acoustic windows, no apical window and no subcostal window. IMPRESSIONS  1. Left ventricular ejection fraction, by estimation, is 70 to 75%. The left ventricle has hyperdynamic function. The left ventricle has no regional wall motion abnormalities. Left ventricular diastolic parameters were normal.  2. Right ventricular systolic function is normal. The right ventricular size is normal.  3. The mitral valve is normal in structure. No evidence of mitral valve regurgitation.  4. The aortic valve is normal in structure. Aortic valve regurgitation is not visualized. FINDINGS  Left Ventricle: Left ventricular ejection fraction, by estimation, is 70 to 75%. The left ventricle has hyperdynamic function. The left ventricle has no regional wall motion abnormalities. The left ventricular internal cavity size was small. There is no  left ventricular hypertrophy. Left ventricular diastolic parameters were normal. Right Ventricle: The right ventricular size is normal. No increase in right ventricular wall thickness. Right ventricular systolic function is normal. Left Atrium: Left atrial size was normal in size. Right Atrium: Right atrial size was normal in size. Pericardium: There is no evidence of pericardial effusion. Mitral Valve: The mitral valve is normal in structure. No evidence of mitral valve regurgitation. Tricuspid Valve: The tricuspid valve is normal in structure. Tricuspid valve regurgitation is trivial. Aortic Valve: The aortic valve is normal in structure. Aortic valve regurgitation is not visualized. Pulmonic Valve: The pulmonic valve was normal in structure. Pulmonic valve regurgitation is trivial. Aorta: The aortic root and ascending aorta are structurally normal, with no evidence of dilitation.  IAS/Shunts: No atrial level shunt detected by color flow Doppler.  LEFT VENTRICLE PLAX 2D LVIDd:         4.30 cm LVIDs:         2.80 cm LV PW:         1.40 cm LV IVS:        1.20 cm LVOT diam:     2.30 cm LVOT Area:     4.15 cm  LEFT ATRIUM         Index LA diam:    3.40 cm 1.32 cm/m   AORTA Ao Root diam: 3.90 cm  SHUNTS Systemic Diam: 2.30 cm Serafina Royals MD Electronically signed by Serafina Royals MD Signature Date/Time: 06/20/2022/12:30:25 PM    Final    CT Cervical Spine Wo Contrast  Result Date: 06/19/2022 CLINICAL DATA:  Syncopal episode and subsequent fall. EXAM: CT CERVICAL SPINE WITHOUT CONTRAST TECHNIQUE: Multidetector CT imaging of the cervical spine was performed without intravenous contrast. Multiplanar CT image reconstructions were also generated. RADIATION DOSE REDUCTION: This exam was performed according to the departmental dose-optimization program which includes automated exposure control, adjustment of the mA and/or kV according to patient size and/or use of iterative reconstruction technique. COMPARISON:  None Available. FINDINGS: Alignment: There is straightening of the normal cervical spine lordosis. Skull base and vertebrae: No acute fracture. Chronic and degenerative changes seen along the tip of the dens and adjacent portion of the anterior arch of C1. Soft tissues and spinal canal: No prevertebral fluid or swelling. No visible canal hematoma. Disc levels: Moderate severity endplate sclerosis and mild to moderate severity anterior osteophyte formation is seen at the level of C5-C6. Mild to moderate severity anterior osteophyte formation is also seen at the levels of C3-C4 and C6-C7. There is marked severity narrowing of the anterior atlantoaxial articulation. Moderate  to marked severity intervertebral disc space narrowing is seen at the level of C5-C6. Bilateral moderate to marked severity intervertebral disc space narrowing is seen at the level of C2-C3. Upper chest: Negative.  Other: None. IMPRESSION: 1. No acute fracture or subluxation in the cervical spine. 2. Moderate to marked severity degenerative changes at the levels of C2-C3, C5-C6 and C6-C7. Electronically Signed   By: Aram Candela M.D.   On: 06/19/2022 21:49   CT Angio Chest PE W/Cm &/Or Wo Cm  Result Date: 06/19/2022 CLINICAL DATA:  Central chest pain for few days.  Syncope episode. EXAM: CT ANGIOGRAPHY CHEST WITH CONTRAST TECHNIQUE: Multidetector CT imaging of the chest was performed using the standard protocol during bolus administration of intravenous contrast. Multiplanar CT image reconstructions and MIPs were obtained to evaluate the vascular anatomy. RADIATION DOSE REDUCTION: This exam was performed according to the departmental dose-optimization program which includes automated exposure control, adjustment of the mA and/or kV according to patient size and/or use of iterative reconstruction technique. CONTRAST:  75mL OMNIPAQUE IOHEXOL 350 MG/ML SOLN COMPARISON:  Chest radiograph dated March 14, 2022 FINDINGS: Cardiovascular: Satisfactory opacification of the pulmonary arteries to the segmental level. No evidence of pulmonary embolism. Normal heart size. No pericardial effusion. Main pulmonary trunk is dilated measuring up to 3.8 cm concerning for arterial hypertension. Mild coronary artery atherosclerotic calcifications. Mediastinum/Nodes: No enlarged mediastinal, hilar, or axillary lymph nodes. Thyroid gland, trachea, and esophagus demonstrate no significant findings. Lungs/Pleura: Subsegmental linear atelectasis of the left lower lobe. No evidence of pneumonia or pulmonary edema. No pleural effusion pneumothorax. Upper Abdomen: Advanced fatty infiltration of the pancreas. No acute abnormality. 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 No acute osseous abnormality. Review of the MIP images confirms the above findings. IMPRESSION: 1. No evidence of pulmonary embolism. 2. Main pulmonary trunk  is dilated measuring up to 3.8 cm concerning for pulmonary arterial hypertension. 3. No evidence of pneumonia or pulmonary edema. 4. 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 No acute osseous abnormality. 5. Advanced fatty infiltration of the pancreas. Aortic Atherosclerosis (ICD10-I70.0). Electronically Signed   By: Larose Hires D.O.   On: 06/19/2022 21:47   CT Head Wo Contrast  Result Date: 06/19/2022 CLINICAL DATA:  Central chest pain. EXAM: CT HEAD WITHOUT CONTRAST TECHNIQUE: Contiguous axial images were obtained from the base of the skull through the vertex without intravenous contrast. RADIATION DOSE REDUCTION: This exam was performed according to the departmental dose-optimization program which includes automated exposure control, adjustment of the mA and/or kV according to patient size and/or use of iterative reconstruction technique. COMPARISON:  January 27, 2022 FINDINGS: Brain: There is mild cerebral atrophy with widening of the extra-axial spaces and ventricular dilatation. There are areas of decreased attenuation within the white matter tracts of the supratentorial brain, consistent with microvascular disease changes. Vascular: No hyperdense vessel or unexpected calcification. Skull: Normal. Negative for fracture or focal lesion. Sinuses/Orbits: There is mild left maxillary sinus mucosal thickening. Other: None. IMPRESSION: 1. No acute intracranial abnormality. 2. Generalized cerebral atrophy and microvascular disease changes of the supratentorial brain. 3. Mild left maxillary sinus disease. Electronically Signed   By: Aram Candela M.D.   On: 06/19/2022 21:45      Labs: BNP (last 3 results) Recent Labs    08/16/21 0110 01/27/22 0812 03/14/22 0551  BNP 167.8* 50.9 39.1   Basic Metabolic Panel: Recent Labs  Lab 06/21/22 0502 06/22/22 0512 06/23/22 0449 06/24/22 0529 06/25/22 0524  NA 139 138 139 142 143  K 3.5 4.2 3.6 3.5 3.6  CL 92* 96* 94* 95* 96*  CO2 40* 34* 38* 38*  41*  GLUCOSE 132* 229* 111* 103* 105*  BUN 24* 25* 27* 26* 24*  CREATININE 1.21 1.25* 1.33* 1.10 0.98  CALCIUM 8.5* 8.4* 8.8* 9.0 8.9  MG 2.0 2.2 2.3 2.2 2.2   Liver Function Tests: Recent Labs  Lab 06/22/22 0508  AST 12*  ALT 11  ALKPHOS 71  BILITOT 0.6  PROT 6.9  ALBUMIN 2.9*   No results for input(s): "LIPASE", "AMYLASE" in the last 168 hours. Recent Labs  Lab 06/22/22 1227  AMMONIA 27   CBC: Recent Labs  Lab 06/21/22 0502 06/22/22 0512 06/23/22 0449 06/24/22 0529 06/25/22 0524  WBC 15.0* 11.1* 10.0 9.4 9.7  HGB 13.1 13.0 12.7* 12.8* 12.8*  HCT 41.6 42.0 40.6 40.7 41.4  MCV 94.3 94.2 96.2 92.7 92.6  PLT 219 240 240 247 268   Cardiac Enzymes: No results for input(s): "CKTOTAL", "CKMB", "CKMBINDEX", "TROPONINI" in the last 168 hours. BNP: Invalid input(s): "POCBNP" CBG: Recent Labs  Lab 06/24/22 0735 06/24/22 1135 06/24/22 1639 06/24/22 2114 06/25/22 0809  GLUCAP 125* 201* 241* 180* 103*   D-Dimer No results for input(s): "DDIMER" in the last 72 hours. Hgb A1c No results for input(s): "HGBA1C" in the last 72 hours. Lipid Profile No results for input(s): "CHOL", "HDL", "LDLCALC", "TRIG", "CHOLHDL", "LDLDIRECT" in the last 72 hours. Thyroid function studies No results for input(s): "TSH", "T4TOTAL", "T3FREE", "THYROIDAB" in the last 72 hours.  Invalid input(s): "FREET3" Anemia work up No results for input(s): "VITAMINB12", "FOLATE", "FERRITIN", "TIBC", "IRON", "RETICCTPCT" in the last 72 hours. Urinalysis    Component Value Date/Time   COLORURINE AMBER (A) 06/19/2022 2055   APPEARANCEUR CLOUDY (A) 06/19/2022 2055   APPEARANCEUR Clear 08/20/2013 0849   LABSPEC 1.014 06/19/2022 2055   LABSPEC 1.014 08/20/2013 0849   PHURINE 5.0 06/19/2022 2055   GLUCOSEU NEGATIVE 06/19/2022 2055   GLUCOSEU Negative 08/20/2013 0849   HGBUR MODERATE (A) 06/19/2022 2055   BILIRUBINUR NEGATIVE 06/19/2022 2055   BILIRUBINUR Negative 08/20/2013 0849   KETONESUR  NEGATIVE 06/19/2022 2055   PROTEINUR >=300 (A) 06/19/2022 2055   NITRITE NEGATIVE 06/19/2022 2055   LEUKOCYTESUR LARGE (A) 06/19/2022 2055   LEUKOCYTESUR Negative 08/20/2013 0849   Sepsis Labs Recent Labs  Lab 06/22/22 0512 06/23/22 0449 06/24/22 0529 06/25/22 0524  WBC 11.1* 10.0 9.4 9.7   Microbiology Recent Results (from the past 240 hour(s))  Urine Culture     Status: Abnormal   Collection Time: 06/19/22  8:54 PM   Specimen: Urine, Clean Catch  Result Value Ref Range Status   Specimen Description   Final    URINE, CLEAN CATCH Performed at [facility], [address] [place], [place] [address]    Special Requests   Final    NONE Performed at [facility], [address] [place], [place] [address]    Culture >=100,000 COLONIES/mL ESCHERICHIA COLI (A)  Final   Report Status 06/22/2022 FINAL  Final   Organism ID, Bacteria ESCHERICHIA COLI (A)  Final      Susceptibility   Escherichia coli - MIC*    AMPICILLIN <=2 SENSITIVE Sensitive     CEFAZOLIN <=4 SENSITIVE Sensitive     CEFEPIME <=0.12 SENSITIVE Sensitive     CEFTRIAXONE <=0.25 SENSITIVE Sensitive     CIPROFLOXACIN <=0.25 SENSITIVE Sensitive     GENTAMICIN <=1 SENSITIVE Sensitive     IMIPENEM <=0.25 SENSITIVE Sensitive  NITROFURANTOIN <=16 SENSITIVE Sensitive     TRIMETH/SULFA <=20 SENSITIVE Sensitive     AMPICILLIN/SULBACTAM <=2 SENSITIVE Sensitive     PIP/TAZO <=4 SENSITIVE Sensitive     * >=100,000 COLONIES/mL ESCHERICHIA COLI  Resp Panel by RT-PCR (Flu A&B, Covid) Anterior Nasal Swab     Status: None   Collection Time: 06/19/22  9:58 PM   Specimen: Anterior Nasal Swab  Result Value Ref Range Status   SARS Coronavirus 2 by RT PCR NEGATIVE NEGATIVE Final    Comment: (NOTE) SARS-CoV-2 target nucleic acids are NOT DETECTED.  The SARS-CoV-2 RNA is generally detectable in upper respiratory specimens during the acute phase of infection. The lowest concentration of SARS-CoV-2 viral  copies this assay can detect is 138 copies/mL. A negative result does not preclude SARS-Cov-2 infection and should not be used as the sole basis for treatment or other patient management decisions. A negative result may occur with  improper specimen collection/handling, submission of specimen other than nasopharyngeal swab, presence of viral mutation(s) within the areas targeted by this assay, and inadequate number of viral copies(<138 copies/mL). A negative result must be combined with clinical observations, patient history, and epidemiological information. The expected result is Negative.  Fact Sheet for Patients:  BloggerCourse.com  Fact Sheet for Healthcare Providers:  SeriousBroker.it  This test is no t yet approved or cleared by the [place] FDA and  has been authorized for detection and/or diagnosis of SARS-CoV-2 by FDA under an Emergency Use Authorization (EUA). This EUA will remain  in effect (meaning this test can be used) for the duration of the COVID-19 declaration under Section 564(b)(1) of the Act, 21 U.S.C.section 360bbb-3(b)(1), unless the authorization is terminated  or revoked sooner.       Influenza A by PCR NEGATIVE NEGATIVE Final   Influenza B by PCR NEGATIVE NEGATIVE Final    Comment: (NOTE) The Xpert Xpress SARS-CoV-2/FLU/RSV plus assay is intended as an aid in the diagnosis of influenza from Nasopharyngeal swab specimens and should not be used as a sole basis for treatment. Nasal washings and aspirates are unacceptable for Xpert Xpress SARS-CoV-2/FLU/RSV testing.  Fact Sheet for Patients: BloggerCourse.com  Fact Sheet for Healthcare Providers: SeriousBroker.it  This test is not yet approved or cleared by the [place] FDA and has been authorized for detection and/or diagnosis of SARS-CoV-2 by FDA under an Emergency Use Authorization (EUA). This  EUA will remain in effect (meaning this test can be used) for the duration of the COVID-19 declaration under Section 564(b)(1) of the Act, 21 U.S.C. section 360bbb-3(b)(1), unless the authorization is terminated or revoked.  Performed at [facility], [address] [place], [place] [address]   Respiratory (~20 pathogens) panel by PCR     Status: None   Collection Time: 06/20/22  2:41 PM   Specimen: Nasopharyngeal Swab; Respiratory  Result Value Ref Range Status   Adenovirus NOT DETECTED NOT DETECTED Final   Coronavirus 229E NOT DETECTED NOT DETECTED Final    Comment: (NOTE) The Coronavirus on the Respiratory Panel, DOES NOT test for the novel  Coronavirus (2019 nCoV)    Coronavirus HKU1 NOT DETECTED NOT DETECTED Final   Coronavirus NL63 NOT DETECTED NOT DETECTED Final   Coronavirus OC43 NOT DETECTED NOT DETECTED Final   Metapneumovirus NOT DETECTED NOT DETECTED Final   Rhinovirus / Enterovirus NOT DETECTED NOT DETECTED Final   Influenza A NOT DETECTED NOT DETECTED Final   Influenza B NOT DETECTED NOT DETECTED Final   Parainfluenza Virus 1 NOT  DETECTED NOT DETECTED Final   Parainfluenza Virus 2 NOT DETECTED NOT DETECTED Final   Parainfluenza Virus 3 NOT DETECTED NOT DETECTED Final   Parainfluenza Virus 4 NOT DETECTED NOT DETECTED Final   Respiratory Syncytial Virus NOT DETECTED NOT DETECTED Final   Bordetella pertussis NOT DETECTED NOT DETECTED Final   Bordetella Parapertussis NOT DETECTED NOT DETECTED Final   Chlamydophila pneumoniae NOT DETECTED NOT DETECTED Final   Mycoplasma pneumoniae NOT DETECTED NOT DETECTED Final    Comment: Performed at [facility], [address] [place], [place] [address]     Total time spend on discharging this patient, including the last patient exam, discussing the hospital stay, instructions for ongoing care as it relates to all pertinent caregivers, as well as preparing the medical discharge records, prescriptions, and/or  referrals as applicable, is 50 minutes.    [name], MD  [facility] 06/25/2022, 8:50 AM

## 2022-06-25 NOTE — Progress Notes (Signed)
PT Cancellation Note  Patient Details Name: Cody Alexander MRN: [id number] DOB: 07-Aug-1956   Cancelled Treatment:    Reason Eval/Treat Not Completed: Other (comment)  Offered and encouraged session.  Pt in bed stating he has bee getting up to bathroom on his own without AD.  Stated his legs are still unsteady and he doesn't feel safe.  "I just want to go home like they want me to."  Reviewed that recommendation was for rehab to get stronger "They don't do anything there."  And again stated he just wanted to go home.  Offered and encouraged PT session but he declined again stated he just wanted to go home.   Chesley Noon 06/25/2022, 10:20 AM

## 2022-09-11 NOTE — Progress Notes (Deleted)
   Patient ID: Edward Guthmiller, male    DOB: September 16, 1955, 67 y.o.   MRN: [id number]  HPI  Mr Housman is a 67 y/o male with a history of  Echo 07/23/22 showed an EF of 65% along with mild LAE  LHC done 02/27/18:  Prox LAD lesion is 35% stenosed.  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  Admitted  Review of Systems    Physical Exam    Assessment & Plan:  1: Chronic heart failure with preserved ejection fraction with LAE- - NYHA class

## 2022-09-12 ENCOUNTER — Telehealth: Payer: Self-pay | Admitting: Family

## 2022-09-12 ENCOUNTER — Encounter: Payer: Medicare Other | Admitting: Family

## 2022-09-12 NOTE — Telephone Encounter (Signed)
Patient did not show for his initial [facility] appointment on 09/12/22. Will attempt to reschedule.   

## 2022-10-02 IMAGING — CR DG CHEST 2V
1 series · 2 of 2 positions shown · non-contrast
Comparison: Chest x-ray 08/15/2021.

CLINICAL DATA: 66-year-old male with history of chest pain.

EXAM:
CHEST - 2 VIEW

[Series 1: dg chest 2 view · 0.14mm/px · 2 of 2 slices shown]
[im 1/2]
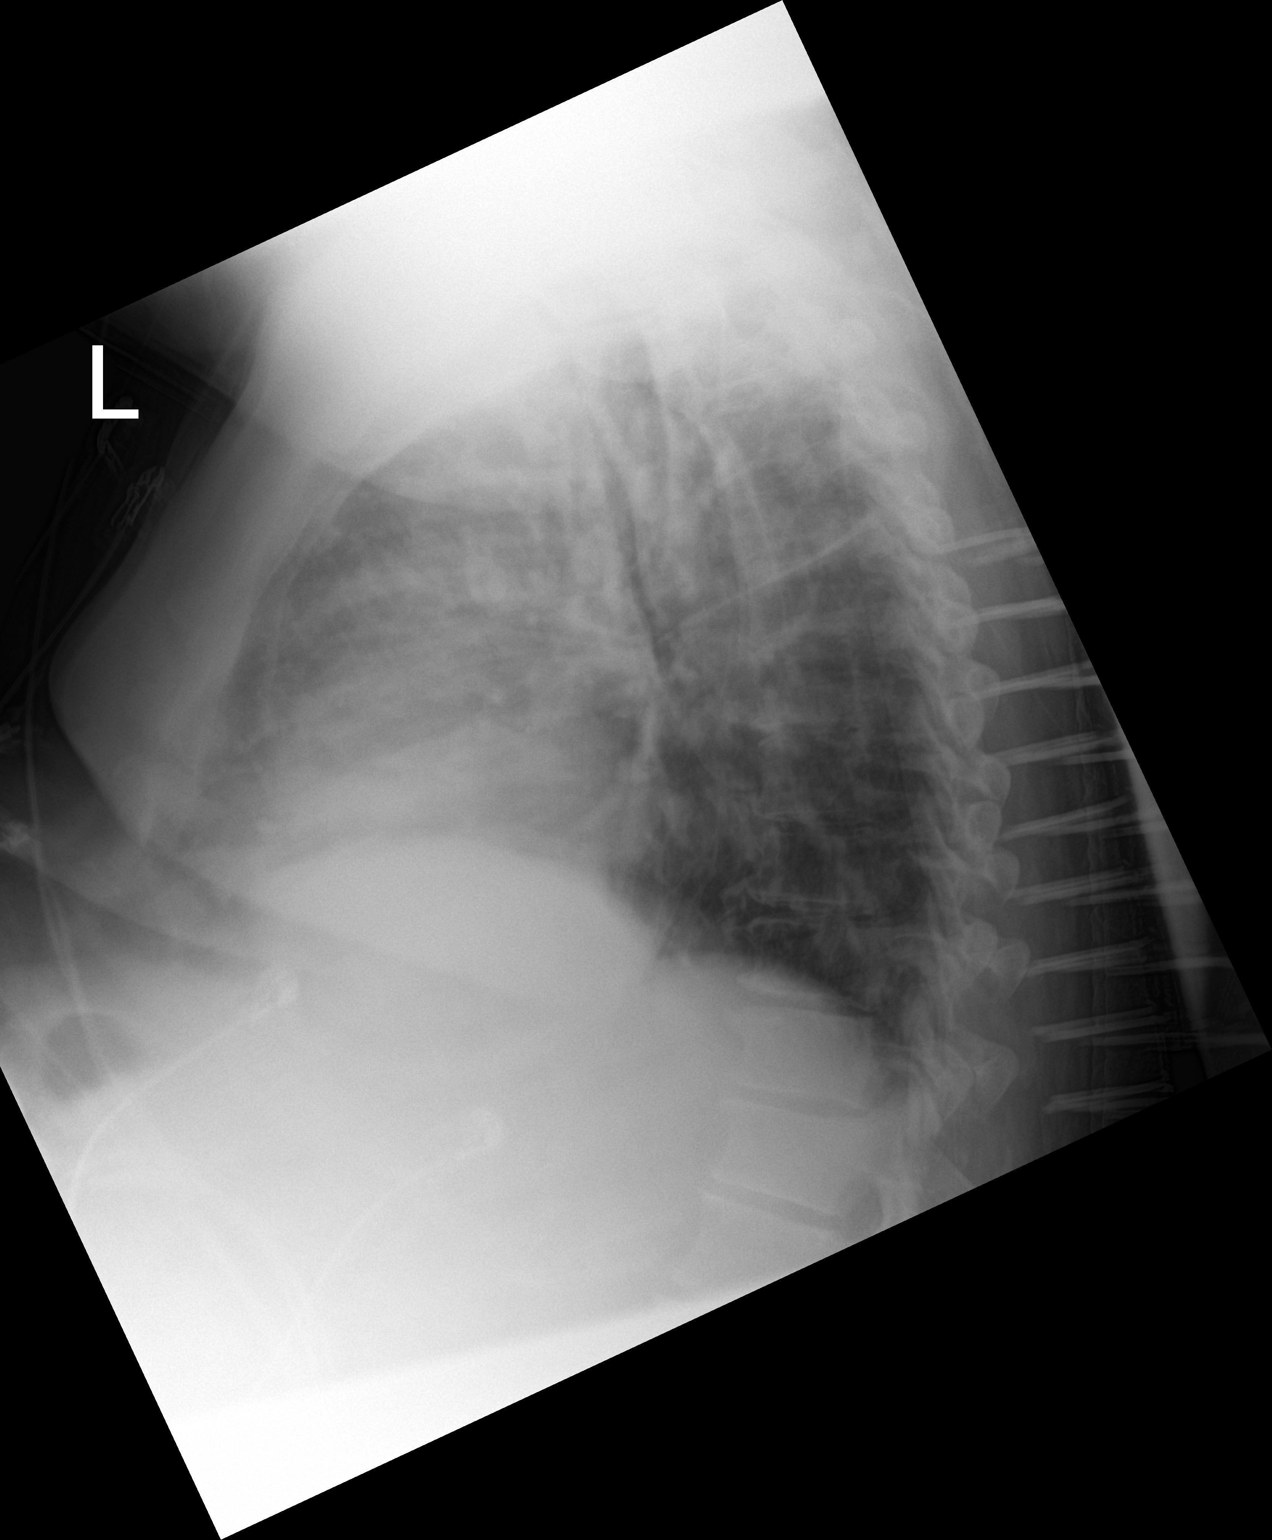
[im 2/2]
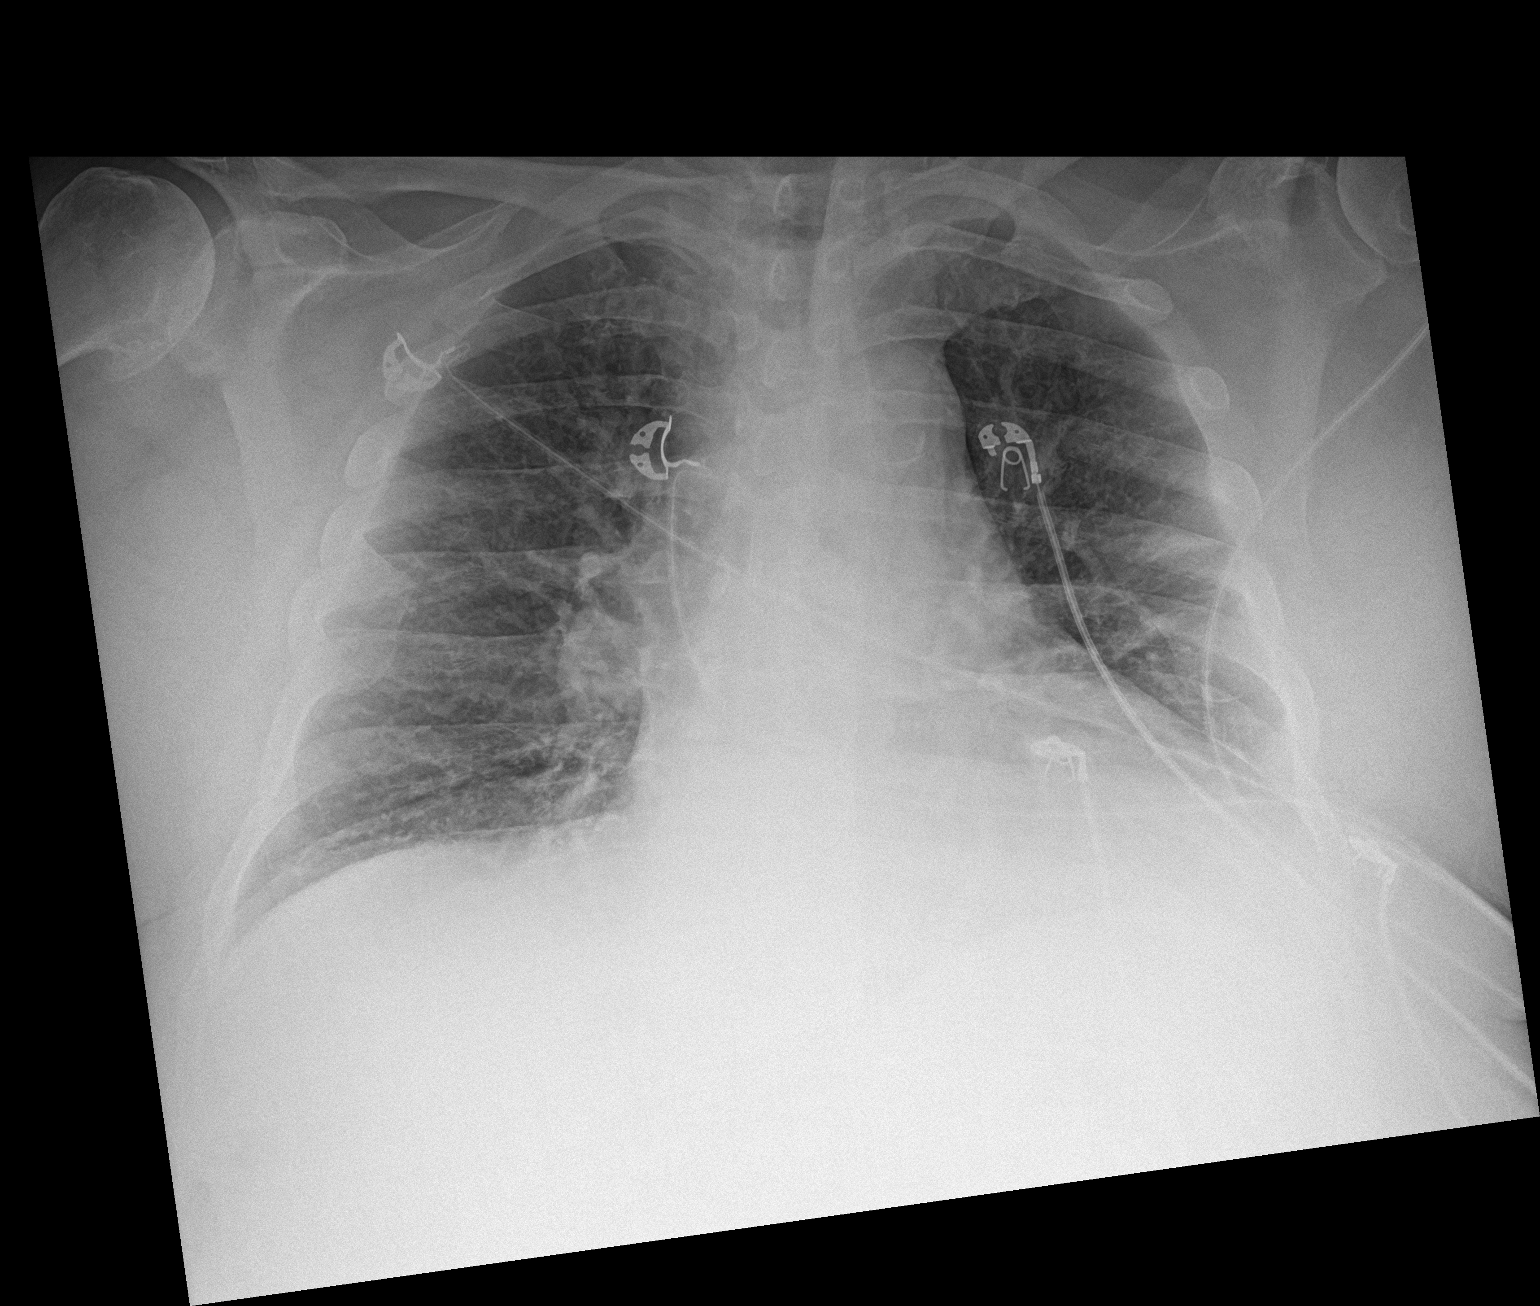

[2 of 2 positions shown; findings below may reference images not displayed]

FINDINGS: Images under penetrated limiting the diagnostic sensitivity and
specificity of the examination. With these limitations in mind, lung
volumes are low. No consolidative airspace disease. No pleural
effusions. No pneumothorax. No pulmonary nodule or mass noted.
Pulmonary vasculature and the cardiomediastinal silhouette are
within normal limits. Atherosclerotic calcifications are noted in
the thoracic aorta.
IMPRESSION: 1. Low lung volumes without radiographic evidence of acute
cardiopulmonary disease.
2. Aortic atherosclerosis.

## 2022-11-09 ENCOUNTER — Emergency Department: Payer: 59

## 2022-11-09 ENCOUNTER — Encounter: Payer: Self-pay | Admitting: Intensive Care

## 2022-11-09 ENCOUNTER — Other Ambulatory Visit: Payer: Self-pay

## 2022-11-09 ENCOUNTER — Inpatient Hospital Stay
Admission: EM | Admit: 2022-11-09 | Discharge: 2022-11-15 | DRG: 291 | Disposition: A | Discharge status: Skilled Nursing Facility | Payer: 59 | Attending: Internal Medicine | Admitting: Internal Medicine

## 2022-11-09 DIAGNOSIS — I110 Hypertensive heart disease with heart failure: Principal | ICD-10-CM | POA: Diagnosis present

## 2022-11-09 DIAGNOSIS — J9611 Chronic respiratory failure with hypoxia: Secondary | ICD-10-CM | POA: Diagnosis present

## 2022-11-09 DIAGNOSIS — I5033 Acute on chronic diastolic (congestive) heart failure: Secondary | ICD-10-CM | POA: Diagnosis present

## 2022-11-09 DIAGNOSIS — Z79899 Other long term (current) drug therapy: Secondary | ICD-10-CM | POA: Diagnosis not present

## 2022-11-09 DIAGNOSIS — E119 Type 2 diabetes mellitus without complications: Secondary | ICD-10-CM

## 2022-11-09 DIAGNOSIS — J449 Chronic obstructive pulmonary disease, unspecified: Secondary | ICD-10-CM | POA: Diagnosis present

## 2022-11-09 DIAGNOSIS — E1165 Type 2 diabetes mellitus with hyperglycemia: Secondary | ICD-10-CM | POA: Diagnosis present

## 2022-11-09 DIAGNOSIS — M549 Dorsalgia, unspecified: Secondary | ICD-10-CM | POA: Diagnosis present

## 2022-11-09 DIAGNOSIS — E11649 Type 2 diabetes mellitus with hypoglycemia without coma: Secondary | ICD-10-CM | POA: Diagnosis not present

## 2022-11-09 DIAGNOSIS — R079 Chest pain, unspecified: Secondary | ICD-10-CM | POA: Diagnosis present

## 2022-11-09 DIAGNOSIS — G4733 Obstructive sleep apnea (adult) (pediatric): Secondary | ICD-10-CM | POA: Diagnosis present

## 2022-11-09 DIAGNOSIS — Z91148 Patient's other noncompliance with medication regimen for other reason: Secondary | ICD-10-CM

## 2022-11-09 DIAGNOSIS — Z955 Presence of coronary angioplasty implant and graft: Secondary | ICD-10-CM

## 2022-11-09 DIAGNOSIS — Z7984 Long term (current) use of oral hypoglycemic drugs: Secondary | ICD-10-CM

## 2022-11-09 DIAGNOSIS — Z8249 Family history of ischemic heart disease and other diseases of the circulatory system: Secondary | ICD-10-CM

## 2022-11-09 DIAGNOSIS — M79604 Pain in right leg: Secondary | ICD-10-CM | POA: Diagnosis present

## 2022-11-09 DIAGNOSIS — Z6841 Body Mass Index (BMI) 40.0 and over, adult: Secondary | ICD-10-CM | POA: Diagnosis not present

## 2022-11-09 DIAGNOSIS — Z794 Long term (current) use of insulin: Secondary | ICD-10-CM

## 2022-11-09 DIAGNOSIS — M79605 Pain in left leg: Secondary | ICD-10-CM | POA: Diagnosis present

## 2022-11-09 DIAGNOSIS — Z87891 Personal history of nicotine dependence: Secondary | ICD-10-CM

## 2022-11-09 DIAGNOSIS — I2510 Atherosclerotic heart disease of native coronary artery without angina pectoris: Secondary | ICD-10-CM | POA: Diagnosis present

## 2022-11-09 DIAGNOSIS — Z9989 Dependence on other enabling machines and devices: Secondary | ICD-10-CM

## 2022-11-09 DIAGNOSIS — Z881 Allergy status to other antibiotic agents status: Secondary | ICD-10-CM

## 2022-11-09 DIAGNOSIS — E66813 Obesity, class 3: Secondary | ICD-10-CM | POA: Diagnosis present

## 2022-11-09 DIAGNOSIS — I10 Essential (primary) hypertension: Secondary | ICD-10-CM | POA: Diagnosis not present

## 2022-11-09 DIAGNOSIS — E1140 Type 2 diabetes mellitus with diabetic neuropathy, unspecified: Secondary | ICD-10-CM | POA: Diagnosis present

## 2022-11-09 DIAGNOSIS — Z7982 Long term (current) use of aspirin: Secondary | ICD-10-CM

## 2022-11-09 DIAGNOSIS — Z7951 Long term (current) use of inhaled steroids: Secondary | ICD-10-CM

## 2022-11-09 DIAGNOSIS — T502X6A Underdosing of carbonic-anhydrase inhibitors, benzothiadiazides and other diuretics, initial encounter: Secondary | ICD-10-CM | POA: Diagnosis present

## 2022-11-09 DIAGNOSIS — Z87892 Personal history of anaphylaxis: Secondary | ICD-10-CM

## 2022-11-09 DIAGNOSIS — I509 Heart failure, unspecified: Principal | ICD-10-CM

## 2022-11-09 DIAGNOSIS — J441 Chronic obstructive pulmonary disease with (acute) exacerbation: Secondary | ICD-10-CM | POA: Diagnosis present

## 2022-11-09 DIAGNOSIS — Z91198 Patient's noncompliance with other medical treatment and regimen for other reason: Secondary | ICD-10-CM | POA: Diagnosis not present

## 2022-11-09 DIAGNOSIS — R0789 Other chest pain: Secondary | ICD-10-CM | POA: Diagnosis present

## 2022-11-09 DIAGNOSIS — Z823 Family history of stroke: Secondary | ICD-10-CM

## 2022-11-09 DIAGNOSIS — R7989 Other specified abnormal findings of blood chemistry: Secondary | ICD-10-CM | POA: Diagnosis present

## 2022-11-09 LAB — BASIC METABOLIC PANEL
Anion gap: 4 — ABNORMAL LOW (ref 5–15)
BUN: 9 mg/dL (ref 8–23)
CO2: 36 mmol/L — ABNORMAL HIGH (ref 22–32)
Calcium: 8.7 mg/dL — ABNORMAL LOW (ref 8.9–10.3)
Chloride: 94 mmol/L — ABNORMAL LOW (ref 98–111)
Creatinine, Ser: 0.9 mg/dL (ref 0.61–1.24)
GFR, Estimated: >60 mL/min (ref >60)
Glucose, Bld: 227 mg/dL — ABNORMAL HIGH (ref 70–99)
Potassium: 3.9 mmol/L (ref 3.5–5.1)
Sodium: 134 mmol/L — ABNORMAL LOW (ref 135–145)

## 2022-11-09 LAB — CBC
HCT: 43.3 % (ref 39.0–52.0)
Hemoglobin: 13.3 g/dL (ref 13.0–17.0)
MCH: 29.2 pg (ref 26.0–34.0)
MCHC: 30.7 g/dL (ref 30.0–36.0)
MCV: 95.2 fL (ref 80.0–100.0)
Platelets: 245 10*3/uL (ref 150–400)
RBC: 4.55 MIL/uL (ref 4.22–5.81)
RDW: 13.3 % (ref 11.5–15.5)
WBC: 8.6 10*3/uL (ref 4.0–10.5)
nRBC: 0 % (ref 0.0–0.2)

## 2022-11-09 LAB — TROPONIN I (HIGH SENSITIVITY)
Troponin I (High Sensitivity): 100 ng/L (ref <18)
Troponin I (High Sensitivity): 104 ng/L (ref <18)

## 2022-11-09 LAB — D-DIMER, QUANTITATIVE: D-Dimer, Quant: 0.86 ug/mL-FEU — ABNORMAL HIGH (ref 0.00–0.50)

## 2022-11-09 MED ORDER — IOHEXOL 350 MG/ML SOLN
100.0000 mL | Freq: Once | INTRAVENOUS | Status: AC | PRN
  Administered 2022-11-09: 100 mL via INTRAVENOUS

## 2022-11-09 MED ORDER — FUROSEMIDE 10 MG/ML IJ SOLN
160.0000 mg | Freq: Once | INTRAVENOUS | Status: AC
  Administered 2022-11-09: 160 mg via INTRAVENOUS
  Filled 2022-11-09: qty 16

## 2022-11-09 NOTE — Assessment & Plan Note (Signed)
Not acutely exacerbated ?Continue home inhalers with DuoNebs as needed ?

## 2022-11-09 NOTE — Progress Notes (Signed)
Received pt from ED at 2325. Patient is AAOX4. Sats at 94% on 4L via [place]. Currently complaining of 9/10 chest pain. Skin assessment performed in presence of 2 RN's. Noted with sore to right buttocks, 2 painless cysts to his back and + 3 edema to bilateral lower extremities. Patient was oriented to room and call bell placed within reach.

## 2022-11-09 NOTE — Assessment & Plan Note (Signed)
CPAP nightly

## 2022-11-09 NOTE — Assessment & Plan Note (Signed)
Secondary to medication and adherence. Review of recent discharge summary from [facility] reveals that patient has had several hospitalizations for CHF for similar reasons EF 65% G1 DD 07/22/2022 - IV Lasix - Continue spironolactone, metoprolol, isosorbide - Daily weights with intake and output monitoring - Counseled on importance of adherence with medication and diet

## 2022-11-09 NOTE — ED Notes (Signed)
This RN attempted IV x 4. No blood. Called lab for collection.

## 2022-11-09 NOTE — Progress Notes (Incomplete)
Received pt from ED at 2325. Patient is AAOX4.On 4L via [place]. Currently complaining of 9/10 chest pain

## 2022-11-09 NOTE — ED Triage Notes (Signed)
Patient presents with chest pain. Describes it as a needle feeling. Also c/o SOB and bilateral leg swelling and pain.  Wears 4L continuously   Hx CHF

## 2022-11-09 NOTE — Assessment & Plan Note (Addendum)
Elevated troponin and atypical chest pain Troponin 104 and patient reports chest pain feeling like needles piercing his chest Suspect troponin elevation is related to demand ischemia CTA chest ruled out PE as etiology for his chest pain - Continue to trend troponins - Continue metoprolol, isosorbide, atorvastatin - Nitroglycerin as needed chest pain

## 2022-11-09 NOTE — Assessment & Plan Note (Signed)
Diabetic neuropathy Blood sugar 227.  A1c in February was over 10 Continue basal insulin Sliding scale insulin coverage Lyrica 50 mg twice daily

## 2022-11-09 NOTE — Assessment & Plan Note (Signed)
-  Continue metoprolol and lisinopril 

## 2022-11-09 NOTE — ED Provider Notes (Signed)
[facility] [place] Provider Note    Event Date/Time   First MD Initiated Contact with Patient 11/09/22 1919     (approximate)   History   Chief Complaint: Shortness of Breath, Chest Pain, and Leg Swelling   HPI  Cody Alexander is a 67 y.o. male with a history of diabetes, hypertension, COPD, CHF, morbid obesity who comes ED complaining of of chest pain described as sharp, worse with breathing, associated with shortness of breath.  Also reports worsening bilateral leg swelling, dyspnea on exertion, orthopnea over the past 1 to 2 weeks.  This is in the setting of running out of his diuretic Bumex a few weeks ago.  No syncope or trauma, no fever.     Physical Exam   Triage Vital Signs: ED Triage Vitals  Enc Vitals Group     BP 11/09/22 1720 (!) 173/108     Pulse Rate 11/09/22 1720 90     Resp 11/09/22 1720 (!) 22     Temp 11/09/22 1720 98.2 F (36.8 C)     Temp Source 11/09/22 1720 Oral     SpO2 11/09/22 1720 95 %     Weight 11/09/22 1721 (!) 318 lb (144.2 kg)     Height 11/09/22 1721 6\' 1"  (1.854 m)     Head Circumference --      Peak Flow --      Pain Score 11/09/22 1721 5     Pain Loc --      Pain Edu? --      Excl. in [place]? --     Most recent vital signs: Vitals:   11/09/22 2341 11/10/22 0014  BP: (!) 150/95 135/83  Pulse: 99 90  Resp: 20   Temp: 98.3 F (36.8 C) 98.1 F (36.7 C)  SpO2: 94% 96%    General: Awake, no distress.  CV:  Good peripheral perfusion.  Regular rate and rhythm.  Normal distal pulses Resp:  Normal effort.  Mild basilar crackles bilaterally.  No wheezing. Abd:  No distention.  Soft nontender Other:  Bilateral lymphedema with 2+ pitting edema bilateral lower extremities.     ED Results / Procedures / Treatments   Labs (all labs ordered are listed, but only abnormal results are displayed) Labs Reviewed  BASIC METABOLIC PANEL - Abnormal; Notable for the following components:      Result Value   Sodium 134 (*)     Chloride 94 (*)    CO2 36 (*)    Glucose, Bld 227 (*)    Calcium 8.7 (*)    Anion gap 4 (*)    All other components within normal limits  D-DIMER, QUANTITATIVE (NOT AT [facility]) - Abnormal; Notable for the following components:   D-Dimer, Quant 0.86 (*)    All other components within normal limits  TROPONIN I (HIGH SENSITIVITY) - Abnormal; Notable for the following components:   Troponin I (High Sensitivity) 100 (*)    All other components within normal limits  TROPONIN I (HIGH SENSITIVITY) - Abnormal; Notable for the following components:   Troponin I (High Sensitivity) 104 (*)    All other components within normal limits  CBC  CREATININE, SERUM  HIV ANTIBODY (ROUTINE TESTING W REFLEX)  HEMOGLOBIN A1C  BRAIN NATRIURETIC PEPTIDE  BASIC METABOLIC PANEL  CBC         RADIOLOGY Chest x-ray interpreted by me, appears unremarkable.  Radiology report reviewed  CT angiogram of the chest negative for PE.  Does show some evidence  of bronchitis   PROCEDURES:  Procedures   MEDICATIONS ORDERED IN ED: Medications  atorvastatin (LIPITOR) tablet 40 mg (has no administration in time range)  isosorbide dinitrate (ISORDIL) tablet 30 mg (has no administration in time range)  lisinopril (ZESTRIL) tablet 20 mg (has no administration in time range)  metoprolol tartrate (LOPRESSOR) tablet 25 mg (25 mg Oral Given 11/10/22 0015)  spironolactone (ALDACTONE) tablet 12.5 mg (has no administration in time range)  insulin detemir (LEVEMIR) injection 30 Units (has no administration in time range)  insulin aspart (novoLOG) injection 16 Units (has no administration in time range)  gabapentin (NEURONTIN) capsule 600 mg (600 mg Oral Given 11/10/22 0015)  mometasone-formoterol (DULERA) 200-5 MCG/ACT inhaler 2 puff (has no administration in time range)  nitroGLYCERIN (NITROSTAT) SL tablet 0.4 mg (has no administration in time range)  morphine (PF) 2 MG/ML injection 2 mg (has no administration in time  range)  enoxaparin (LOVENOX) injection 72.5 mg (has no administration in time range)  acetaminophen (TYLENOL) tablet 650 mg (has no administration in time range)    Or  acetaminophen (TYLENOL) suppository 650 mg (has no administration in time range)  ondansetron (ZOFRAN) tablet 4 mg (has no administration in time range)    Or  ondansetron (ZOFRAN) injection 4 mg (has no administration in time range)  furosemide (LASIX) injection 80 mg (has no administration in time range)  insulin aspart (novoLOG) injection 0-20 Units (has no administration in time range)  insulin aspart (novoLOG) injection 0-5 Units (has no administration in time range)  HYDROcodone-acetaminophen (NORCO/VICODIN) 5-325 MG per tablet 1-2 tablet (has no administration in time range)  furosemide (LASIX) 160 mg in dextrose 5 % 50 mL IVPB (0 mg Intravenous Stopped 11/09/22 2244)  iohexol (OMNIPAQUE) 350 MG/ML injection 100 mL (100 mLs Intravenous Contrast Given 11/09/22 2134)     IMPRESSION / MDM / ASSESSMENT AND PLAN / ED COURSE  I reviewed the triage vital signs and the nursing notes.  DDx: CHF exacerbation, non-STEMI, COPD exacerbation, pulmonary embolism, electrolyte abnormality, anemia  Patient's presentation is most consistent with acute presentation with potential threat to life or bodily function.  Patient presents with symptoms of CHF exacerbation.  Vital signs are unremarkable, oxygenation is normal on his chronic 4 L nasal cannula.  Labs unremarkable with troponin at chronic baseline.  Due to severity of symptoms and functional decline as a result, he will need IV diuresis and hospitalization.  Case discussed with the hospitalist.  IV Lasix ordered.       FINAL CLINICAL IMPRESSION(S) / ED DIAGNOSES   Final diagnoses:  Acute on chronic congestive heart failure, unspecified heart failure type ([place])  Morbid obesity ([place])  Type 2 diabetes mellitus without complication, with long-term current use of insulin ([place])      Rx / DC Orders   ED Discharge Orders     None        Note:  This document was prepared using Dragon voice recognition software and may include unintentional dictation errors.   Carrie Mew, MD 11/10/22 [phone]

## 2022-11-09 NOTE — H&P (Incomplete)
History and Physical    Patient: Cody Alexander DOB: 11/08/1955 DOA: 11/09/2022 DOS: the patient was seen and examined on 11/09/2022 PCP: [place]  Patient coming from: Home  Chief Complaint:  Chief Complaint  Patient presents with   Shortness of Breath   Chest Pain   Leg Swelling   HPI: Cody Alexander is a 67 y.o. male with medical history significant of Combined CHF with recovered EF (EF 65% G1 DD 07/22/2022), COPD, CAD s/p stent x1, HTN, T2DM , class III obesity, OSA, recently hospitalized from 2/25 to 2/29 at [facility] for CHF exacerbation after presenting with a 20 pound weight gain believe related to diuretic nonadherence who presents to the ED with shortness of breath, lower extremity edema and piercing chest pain.  Patient states that when he left the hospital he was given a 1 week supply of Lasix to replace his Bumex but then ran out and had trouble getting a refill and as such has been out of diuretics for the past 2 weeks.  He denies cough fever or chills. ED course and data review: BP 173/108 with mild tachypnea of 22 and otherwise normal vitals.  Troponin 100>104.  No BNP done.  Labs otherwise notable for D-dimer of 0.86, blood glucose 227.   EKG, independently interpreted NSR at 85 with no acute ST-T wave changes.  CTA chest as follows: IMPRESSION: 1. No pulmonary embolus. 2. Morphologic changes in keeping with pulmonary arterial hypertension, similar to prior examination. 3. Mild multi-vessel coronary artery calcification. Mild cardiomegaly with asymmetric left ventricular hypertrophy with thinning at the left ventricular apex suggesting ischemia or prior myocardial infarction in this location. 4. Bronchial wall thickening centrally in keeping with changes of airway inflammation.  Patient treated with IV Lasix.  Hospitalist consulted for admission for CHF exacerbation  Review of Systems: As mentioned in the history of present illness.  All other systems reviewed and are negative. Past Medical History:  Diagnosis Date   CHF (congestive heart failure) (HCC)    COPD (chronic obstructive pulmonary disease) (HCC)    Diabetes mellitus without complication ([place])    Hypertension    Neuropathy    Past Surgical History:  Procedure Laterality Date   LEFT HEART CATH AND CORONARY ANGIOGRAPHY Right 02/27/2018   Procedure: LEFT HEART CATH AND CORONARY ANGIOGRAPHY;  Surgeon: Dionisio David, MD;  Location: [facility];  Service: Cardiovascular;  Laterality: Right;   Social History:  reports that he has quit smoking. His smoking use included cigarettes. He has never used smokeless tobacco. He reports that he does not drink alcohol and does not use drugs.  Allergies  Allergen Reactions   Erythromycin Anaphylaxis and Swelling    Had eye swelling with erythromycin during a time when he had perf ear drum.  Tolerated azithromycin.    Family History  Problem Relation Age of Onset   Stroke Mother    Hypertension Father     Prior to Admission medications   Medication Sig Start Date End Date Taking? Authorizing Provider  acetaminophen (TYLENOL) 500 MG tablet Take 1,000 mg by mouth every 6 (six) hours as needed for mild pain or moderate pain.    [provider]  atorvastatin (LIPITOR) 40 MG tablet Take 40 mg by mouth daily. Patient not taking: Reported on 06/19/2022    [provider]  budesonide-formoterol (SYMBICORT) 160-4.5 MCG/ACT inhaler Inhale 2 puffs into the lungs 2 (two) times daily. Patient not taking: Reported on 06/19/2022    [provider]  furosemide (LASIX) 80 MG tablet Take 1 tablet (80 mg total) by mouth daily. 06/25/22   Enzo Bi, MD  gabapentin (NEURONTIN) 600 MG tablet Take 600 mg by mouth 3 (three) times daily. 04/18/22   [provider]  hydrOXYzine (ATARAX) 10 MG tablet Take 1 tablet (10 mg total) by mouth 3 (three) times daily as needed. Home med. 06/25/22   Enzo Bi,  MD  insulin detemir (LEVEMIR) 100 UNIT/ML FlexPen Inject 30 Units into the skin in the morning and at bedtime. 06/25/22   Enzo Bi, MD  insulin lispro (HUMALOG) 100 UNIT/ML KwikPen Inject 16 Units into the skin 3 (three) times daily with meals.    [provider]  ipratropium-albuterol (DUONEB) 0.5-2.5 (3) MG/3ML SOLN Take 3 mLs by nebulization every 6 (six) hours as needed. 06/25/22   Enzo Bi, MD  isosorbide dinitrate (ISORDIL) 30 MG tablet Take 30 mg by mouth every morning. Patient not taking: Reported on 06/19/2022 11/14/20   [provider]  lisinopril (ZESTRIL) 20 MG tablet Take 20 mg by mouth daily.    [provider]  metFORMIN (GLUCOPHAGE) 1000 MG tablet Take 1,000 mg by mouth 2 (two) times daily. Patient not taking: Reported on 06/19/2022 06/02/20   [provider]  metoprolol tartrate (LOPRESSOR) 25 MG tablet Take 25 mg by mouth 2 (two) times daily. Patient not taking: Reported on 06/19/2022    [provider]  PROAIR HFA 108 [phone] Base) MCG/ACT inhaler Inhale 2 puffs into the lungs every 4 (four) hours as needed for wheezing. 08/18/21   Loletha Grayer, MD  spironolactone (ALDACTONE) 25 MG tablet Take 12.5 mg by mouth daily. Patient not taking: Reported on 06/19/2022    [provider]  terbinafine (LAMISIL) 250 MG tablet Take 250 mg by mouth daily. 05/10/22   [provider]  traMADol (ULTRAM) 50 MG tablet Take 50 mg by mouth 2 (two) times daily as needed. 05/10/22   [provider]    Physical Exam: Vitals:   11/09/22 2100 11/09/22 2214 11/09/22 2250 11/09/22 2341  BP: (!) 169/115  (!) 160/98 (!) 150/95  Pulse: 85  85 99  Resp: (!) 22 20 20 20   Temp: 98.6 F (37 C)  98.4 F (36.9 C) 98.3 F (36.8 C)  TempSrc: Oral  Oral Oral  SpO2: 94%  96% 94%  Weight:      Height:       Physical Exam  Data Reviewed: Relevant notes from primary care and specialist visits, past discharge summaries as available in  EHR, including Care Everywhere. Prior diagnostic testing as pertinent to current admission diagnoses Updated medications and problem lists for reconciliation ED course, including vitals, labs, imaging, treatment and response to treatment Triage notes, nursing and pharmacy notes and ED provider's notes Notable results as noted in HPI   Assessment and Plan: * Acute on chronic diastolic CHF (congestive heart failure) ([place]) Secondary to medication and adherence. Review of recent discharge summary from [facility] reveals that patient has had several hospitalizations for CHF for similar reasons EF 65% G1 DD 07/22/2022 - IV Lasix - Continue spironolactone, metoprolol, isosorbide - Daily weights with intake and output monitoring - Counseled on importance of adherence with medication and diet  CAD (coronary artery disease) Elevated troponin and atypical chest pain Troponin 104 and patient reports chest pain feeling like needles piercing his chest Suspect troponin elevation is related to demand ischemia CTA chest ruled out PE as etiology for his chest pain - Continue  to trend troponins - Continue metoprolol, isosorbide, atorvastatin - Nitroglycerin as needed chest pain  Essential hypertension Continue metoprolol and lisinopril  Chronic obstructive pulmonary disease (COPD) (HCC) Not acutely exacerbated Continue home inhalers with DuoNebs as needed  Type 2 diabetes mellitus with hyperglycemia, with long-term current use of insulin (HCC) Blood sugar 227 Continue basal insulin Sliding scale insulin coverage  OSA (obstructive sleep apnea) CPAP nightly  Obesity, Class III, BMI 40-49.9 (morbid obesity) ([place]) Complicating factor to overall prognosis and care      Advance Care Planning:   Code Status: Prior   Consults: none  Family Communication: none  Severity of Illness: The appropriate patient status for this patient is OBSERVATION. Observation status is judged to be reasonable and  necessary in order to provide the required intensity of service to ensure the patient's safety. The patient's presenting symptoms, physical exam findings, and initial radiographic and laboratory data in the context of their medical condition is felt to place them at decreased risk for further clinical deterioration. Furthermore, it is anticipated that the patient will be medically stable for discharge from the hospital within 2 midnights of admission.   Author: Athena Masse, MD 11/09/2022 11:51 PM  For on call review [email or web address].

## 2022-11-09 NOTE — Assessment & Plan Note (Signed)
Complicating factor to overall prognosis and care 

## 2022-11-10 ENCOUNTER — Inpatient Hospital Stay: Payer: 59

## 2022-11-10 DIAGNOSIS — I5033 Acute on chronic diastolic (congestive) heart failure: Secondary | ICD-10-CM | POA: Diagnosis not present

## 2022-11-10 DIAGNOSIS — M79604 Pain in right leg: Secondary | ICD-10-CM

## 2022-11-10 LAB — GLUCOSE, CAPILLARY
Glucose-Capillary: 172 mg/dL — ABNORMAL HIGH (ref 70–99)
Glucose-Capillary: 155 mg/dL — ABNORMAL HIGH (ref 70–99)
Glucose-Capillary: 118 mg/dL — ABNORMAL HIGH (ref 70–99)
Glucose-Capillary: 217 mg/dL — ABNORMAL HIGH (ref 70–99)
Glucose-Capillary: 155 mg/dL — ABNORMAL HIGH (ref 70–99)
Glucose-Capillary: 107 mg/dL — ABNORMAL HIGH (ref 70–99)
Glucose-Capillary: 60 mg/dL — ABNORMAL LOW (ref 70–99)
Glucose-Capillary: 63 mg/dL — ABNORMAL LOW (ref 70–99)
Glucose-Capillary: 105 mg/dL — ABNORMAL HIGH (ref 70–99)

## 2022-11-10 LAB — BASIC METABOLIC PANEL
Anion gap: 3 — ABNORMAL LOW (ref 5–15)
BUN: 8 mg/dL (ref 8–23)
CO2: 41 mmol/L — ABNORMAL HIGH (ref 22–32)
Calcium: 8.3 mg/dL — ABNORMAL LOW (ref 8.9–10.3)
Chloride: 90 mmol/L — ABNORMAL LOW (ref 98–111)
Creatinine, Ser: 0.83 mg/dL (ref 0.61–1.24)
GFR, Estimated: >60 mL/min (ref >60)
Glucose, Bld: 214 mg/dL — ABNORMAL HIGH (ref 70–99)
Potassium: 3.6 mmol/L (ref 3.5–5.1)
Sodium: 134 mmol/L — ABNORMAL LOW (ref 135–145)

## 2022-11-10 LAB — BRAIN NATRIURETIC PEPTIDE: B Natriuretic Peptide: 82.7 pg/mL (ref 0.0–100.0)

## 2022-11-10 LAB — CBC
HCT: 42.9 % (ref 39.0–52.0)
Hemoglobin: 13.2 g/dL (ref 13.0–17.0)
MCH: 29 pg (ref 26.0–34.0)
MCHC: 30.8 g/dL (ref 30.0–36.0)
MCV: 94.3 fL (ref 80.0–100.0)
Platelets: 244 10*3/uL (ref 150–400)
RBC: 4.55 MIL/uL (ref 4.22–5.81)
RDW: 13.2 % (ref 11.5–15.5)
WBC: 9 10*3/uL (ref 4.0–10.5)
nRBC: 0 % (ref 0.0–0.2)

## 2022-11-10 LAB — TROPONIN I (HIGH SENSITIVITY)
Troponin I (High Sensitivity): 113 ng/L (ref <18)
Troponin I (High Sensitivity): 99 ng/L — ABNORMAL HIGH (ref <18)

## 2022-11-10 LAB — HIV ANTIBODY (ROUTINE TESTING W REFLEX): HIV Screen 4th Generation wRfx: NONREACTIVE

## 2022-11-10 MED ORDER — ACETAMINOPHEN 325 MG PO TABS
650.0000 mg | ORAL_TABLET | Freq: Four times a day (QID) | ORAL | Status: DC | PRN
  Administered 2022-11-11: 650 mg via ORAL
  Filled 2022-11-10: qty 2

## 2022-11-10 MED ORDER — IPRATROPIUM-ALBUTEROL 0.5-2.5 (3) MG/3ML IN SOLN
3.0000 mL | Freq: Four times a day (QID) | RESPIRATORY_TRACT | Status: DC
  Administered 2022-11-10 (×2): 3 mL via RESPIRATORY_TRACT
  Filled 2022-11-10 (×2): qty 3

## 2022-11-10 MED ORDER — INSULIN DETEMIR 100 UNIT/ML ~~LOC~~ SOLN
22.0000 [IU] | Freq: Once | SUBCUTANEOUS | Status: AC
  Administered 2022-11-10: 22 [IU] via SUBCUTANEOUS
  Filled 2022-11-10: qty 0.22

## 2022-11-10 MED ORDER — IPRATROPIUM-ALBUTEROL 0.5-2.5 (3) MG/3ML IN SOLN
3.0000 mL | Freq: Three times a day (TID) | RESPIRATORY_TRACT | Status: DC
  Administered 2022-11-10 – 2022-11-11 (×3): 3 mL via RESPIRATORY_TRACT
  Filled 2022-11-10 (×3): qty 3

## 2022-11-10 MED ORDER — ACETAMINOPHEN 650 MG RE SUPP: 650.0000 mg | Freq: Four times a day (QID) | RECTAL | Status: DC | PRN

## 2022-11-10 MED ORDER — FUROSEMIDE 10 MG/ML IJ SOLN
80.0000 mg | Freq: Two times a day (BID) | INTRAMUSCULAR | Status: DC
  Administered 2022-11-10 – 2022-11-14 (×9): 80 mg via INTRAVENOUS
  Filled 2022-11-10 (×9): qty 8

## 2022-11-10 MED ORDER — MOMETASONE FURO-FORMOTEROL FUM 200-5 MCG/ACT IN AERO
2.0000 | INHALATION_SPRAY | Freq: Two times a day (BID) | RESPIRATORY_TRACT | Status: DC
  Administered 2022-11-10 – 2022-11-15 (×10): 2 via RESPIRATORY_TRACT
  Filled 2022-11-10: qty 8.8

## 2022-11-10 MED ORDER — INSULIN DETEMIR 100 UNIT/ML ~~LOC~~ SOLN
30.0000 [IU] | Freq: Every day | SUBCUTANEOUS | Status: DC
  Administered 2022-11-12 – 2022-11-14 (×3): 30 [IU] via SUBCUTANEOUS
  Filled 2022-11-10 (×4): qty 0.3

## 2022-11-10 MED ORDER — MORPHINE SULFATE (PF) 2 MG/ML IV SOLN
2.0000 mg | INTRAVENOUS | Status: DC | PRN
  Administered 2022-11-10 – 2022-11-13 (×7): 2 mg via INTRAVENOUS
  Filled 2022-11-10 (×7): qty 1

## 2022-11-10 MED ORDER — LISINOPRIL 20 MG PO TABS
20.0000 mg | ORAL_TABLET | Freq: Every day | ORAL | Status: DC
  Administered 2022-11-10 – 2022-11-15 (×6): 20 mg via ORAL
  Filled 2022-11-10 (×8): qty 1

## 2022-11-10 MED ORDER — INSULIN ASPART 100 UNIT/ML IJ SOLN
16.0000 [IU] | Freq: Three times a day (TID) | INTRAMUSCULAR | Status: DC
  Administered 2022-11-10 – 2022-11-15 (×15): 16 [IU] via SUBCUTANEOUS
  Filled 2022-11-10 (×14): qty 1

## 2022-11-10 MED ORDER — ATORVASTATIN CALCIUM 20 MG PO TABS
40.0000 mg | ORAL_TABLET | Freq: Every day | ORAL | Status: DC
  Administered 2022-11-10 – 2022-11-15 (×6): 40 mg via ORAL
  Filled 2022-11-10 (×7): qty 2

## 2022-11-10 MED ORDER — NITROGLYCERIN 0.4 MG SL SUBL
0.4000 mg | SUBLINGUAL_TABLET | SUBLINGUAL | Status: DC | PRN
  Administered 2022-11-10 (×3): 0.4 mg via SUBLINGUAL
  Filled 2022-11-10 (×2): qty 1

## 2022-11-10 MED ORDER — ISOSORBIDE DINITRATE 30 MG PO TABS
30.0000 mg | ORAL_TABLET | Freq: Every morning | ORAL | Status: DC
  Administered 2022-11-10 – 2022-11-15 (×6): 30 mg via ORAL
  Filled 2022-11-10 (×6): qty 1

## 2022-11-10 MED ORDER — INSULIN ASPART 100 UNIT/ML IJ SOLN
0.0000 [IU] | Freq: Every day | INTRAMUSCULAR | Status: DC
  Administered 2022-11-10: 2 [IU] via SUBCUTANEOUS
  Filled 2022-11-10 (×2): qty 1

## 2022-11-10 MED ORDER — ENOXAPARIN SODIUM 80 MG/0.8ML IJ SOSY
0.5000 mg/kg | PREFILLED_SYRINGE | INTRAMUSCULAR | Status: DC
  Administered 2022-11-10 – 2022-11-11 (×2): 72.5 mg via SUBCUTANEOUS
  Filled 2022-11-10 (×5): qty 0.8

## 2022-11-10 MED ORDER — METOPROLOL TARTRATE 25 MG PO TABS
25.0000 mg | ORAL_TABLET | Freq: Two times a day (BID) | ORAL | Status: DC
  Administered 2022-11-10 – 2022-11-15 (×11): 25 mg via ORAL
  Filled 2022-11-10 (×12): qty 1

## 2022-11-10 MED ORDER — PREGABALIN 50 MG PO CAPS
50.0000 mg | ORAL_CAPSULE | Freq: Two times a day (BID) | ORAL | Status: DC
  Administered 2022-11-10 – 2022-11-15 (×7): 50 mg via ORAL
  Filled 2022-11-10 (×9): qty 1

## 2022-11-10 MED ORDER — SPIRONOLACTONE 12.5 MG HALF TABLET
12.5000 mg | ORAL_TABLET | Freq: Every day | ORAL | Status: DC
  Administered 2022-11-10 – 2022-11-15 (×6): 12.5 mg via ORAL
  Filled 2022-11-10 (×6): qty 1

## 2022-11-10 MED ORDER — ONDANSETRON HCL 4 MG PO TABS: 4.0000 mg | ORAL_TABLET | Freq: Four times a day (QID) | ORAL | Status: DC | PRN

## 2022-11-10 MED ORDER — GABAPENTIN 300 MG PO CAPS
600.0000 mg | ORAL_CAPSULE | Freq: Three times a day (TID) | ORAL | Status: DC
  Administered 2022-11-10: 600 mg via ORAL
  Filled 2022-11-10 (×2): qty 2

## 2022-11-10 MED ORDER — INSULIN DETEMIR 100 UNIT/ML ~~LOC~~ SOLN
30.0000 [IU] | Freq: Every day | SUBCUTANEOUS | Status: DC
  Administered 2022-11-10: 30 [IU] via SUBCUTANEOUS
  Filled 2022-11-10 (×2): qty 0.3

## 2022-11-10 MED ORDER — ONDANSETRON HCL 4 MG/2ML IJ SOLN: 4.0000 mg | Freq: Four times a day (QID) | INTRAMUSCULAR | Status: DC | PRN

## 2022-11-10 MED ORDER — HYDROCODONE-ACETAMINOPHEN 5-325 MG PO TABS
1.0000 | ORAL_TABLET | ORAL | Status: DC | PRN
  Administered 2022-11-10 – 2022-11-11 (×6): 2 via ORAL
  Filled 2022-11-10 (×6): qty 2

## 2022-11-10 MED ORDER — INSULIN ASPART 100 UNIT/ML IJ SOLN
0.0000 [IU] | Freq: Three times a day (TID) | INTRAMUSCULAR | Status: DC
  Administered 2022-11-10 (×2): 4 [IU] via SUBCUTANEOUS
  Administered 2022-11-10 – 2022-11-11 (×2): 7 [IU] via SUBCUTANEOUS
  Filled 2022-11-10 (×4): qty 1

## 2022-11-10 MED ORDER — ACETAMINOPHEN 500 MG PO TABS: 1000.0000 mg | ORAL_TABLET | Freq: Four times a day (QID) | ORAL | Status: DC | PRN

## 2022-11-10 NOTE — Assessment & Plan Note (Signed)
Suspect secondary to a combination of diabetic neuropathy and swelling from CHF Patient was given Lyrica 50 mg twice daily when hospitalized in February Patient says oxycodone helped with his leg pain - Will get lower extremity venous Doppler to evaluate for DVT given no CTA chest negative - Will resume Lyrica

## 2022-11-10 NOTE — Progress Notes (Signed)
Anticoagulation monitoring(Lovenox):  67 yo male ordered Lovenox 40 mg Q24h    Filed Weights   11/09/22 1721  Weight: (!) 144.2 kg (318 lb)   BMI 42   Lab Results  Component Value Date   CREATININE 0.90 11/09/2022   CREATININE 0.98 06/25/2022   CREATININE 1.10 06/24/2022   Estimated Creatinine Clearance: 122.2 mL/min (by C-G formula based on SCr of 0.9 mg/dL). Hemoglobin & Hematocrit     Component Value Date/Time   HGB 13.3 11/09/2022 1723   HGB 15.5 08/20/2013 0656   HCT 43.3 11/09/2022 1723   HCT 45.7 08/20/2013 0656     Per Protocol for Patient with estCrcl > 30 ml/min and BMI > 30, will transition to Lovenox 72.5 mg Q24h.

## 2022-11-10 NOTE — Evaluation (Signed)
Occupational Therapy Evaluation Patient Details Name: Cody Alexander MRN: [id number] DOB: 07-24-1956 Today's Date: 11/10/2022   History of Present Illness Cody Alexander is a 67 y.o. male with medical history significant of Combined CHF with recovered EF (EF 65% G1 DD 07/22/2022), COPD, CAD s/p stent x1, HTN, T2DM , class III obesity, OSA, recently hospitalized from 2/25 to 2/29 at [facility] for CHF exacerbation after presenting with a 20 pound weight gain believe related to diuretic nonadherence who presents to the ED with shortness of breath, lower extremity edema and piercing chest pain.  Patient states that when he left the hospital he was given a 1 week supply of Lasix to replace his Bumex but then ran out and had trouble getting a refill and as such has been out of diuretics for the past 2 weeks.  He denies cough fever or chills.  He complains of severe lower extremity pain which started 2 months ago but over the past few days has been worse.   Clinical Impression   Patient agreeable to OT evaluation. Pt presenting with decreased independence in self care, balance, functional mobility/transfers, endurance, and safety awareness. PTA pt was independent in ADLs, IADLs, and functional mobility. Pt A&Ox4. Very tangential with conversation requiring frequent VCs for redirection as well as constant VCs required for safety awareness during OOB mobility. Pt currently functioning at [place] for bed mobility, Min A for simulated toilet transfer, and Min A to take steps near EOB using RW. B knee buckling noted and pt required seated rest break on EOB between standing attempts. He required Max A for LB dressing. Anticipate set up-supervision for seated grooming and UB dressing tasks. Pt on 4L O2 via [place] t/o session and VSS. Pt will benefit from acute OT to increase overall independence in the areas of ADLs and functional mobility in order to safely discharge to next venue of care. OT recommends ongoing therapy upon  discharge to maximize safety and independence with ADLs, decrease fall risk, decrease caregiver burden, and promote return to PLOF.     Recommendations for follow up therapy are one component of a multi-disciplinary discharge planning process, led by the attending physician.  Recommendations may be updated based on patient status, additional functional criteria and insurance authorization.   Follow Up Recommendations  Skilled nursing-short term rehab (<3 hours/day)     Assistance Recommended at Discharge Frequent or constant Supervision/Assistance  Patient can return home with the following A lot of help with bathing/dressing/bathroom;Assistance with cooking/housework;Assist for transportation;Help with stairs or ramp for entrance;A lot of help with walking and/or transfers    Functional Status Assessment  Patient has had a recent decline in their functional status and demonstrates the ability to make significant improvements in function in a reasonable and predictable amount of time.  Equipment Recommendations  BSC/3in1 (bariatric BSC)    Recommendations for Other Services       Precautions / Restrictions Precautions Precautions: Fall Restrictions Weight Bearing Restrictions: No      Mobility Bed Mobility Overal bed mobility: Needs Assistance Bed Mobility: Supine to Sit, Sit to Supine     Supine to sit: Min assist, HOB elevated (for trunk elevation) Sit to supine: Supervision        Transfers Overall transfer level: Needs assistance Equipment used: Rolling walker (2 wheels) Transfers: Sit to/from Stand Sit to Stand: Min assist        General transfer comment: STS 2x, B knee buckling noted - required seated rest break on EOB due to posterior  LOB           Balance Overall balance assessment: Needs assistance Sitting-balance support: Feet supported Sitting balance-Leahy Scale: Fair     Standing balance support: Bilateral upper extremity supported, During  functional activity, Reliant on assistive device for balance Standing balance-Leahy Scale: Poor                             ADL either performed or assessed with clinical judgement   ADL Overall ADL's : Needs assistance/impaired     Grooming: Set up;Sitting; Supervision/safety  Grooming Details (indicate cue type and reason): anticipate     Upper Body Dressing : Set up;Sitting; Supervision/safety  Upper Body Dressing Details (indicate cue type and reason): anticipate  Lower Body Dressing: Maximal assistance;Sitting/lateral leans Lower Body Dressing Details (indicate cue type and reason): socks  Toilet Transfer: Rolling walker (2 wheels);Minimal assistance Toilet Transfer Details (indicate cue type and reason): simulated Toileting- Clothing Manipulation and Hygiene: Maximal assistance;Sit to/from stand       Functional mobility during ADLs: Minimal assistance;Rolling walker (2 wheels) (2 ft forward/backward, ~2 lateral steps at EOB, B knee buckling)       Vision Patient Visual Report: No change from baseline       Perception     Praxis      Pertinent Vitals/Pain Pain Assessment Pain Assessment: Faces Faces Pain Scale: Hurts little more Pain Location: B LEs Pain Descriptors / Indicators: Discomfort, Tightness Pain Intervention(s): Monitored during session, Repositioned     Hand Dominance Right   Extremity/Trunk Assessment Upper Extremity Assessment Upper Extremity Assessment: Generalized weakness   Lower Extremity Assessment Lower Extremity Assessment: Generalized weakness (very jerky with movement, pt endorsed this is not new but has gotten worse recently)   Cervical / Trunk Assessment Cervical / Trunk Assessment: Normal   Communication Communication Communication: No difficulties   Cognition Arousal/Alertness: Awake/alert Behavior During Therapy: WFL for tasks assessed/performed Overall Cognitive Status: Within Functional Limits for tasks  assessed           General Comments: Tangential with conversation requiring frequent VCs for redirection. Constant VCs for safety awareness during mobility.     General Comments  Pt received on 4L O2 via [place]. Before activity: SpO2 95%, HR 60s. After activity: SpO2 92%, HR 70s.    Exercises Other Exercises Other Exercises: OT provided education re: role of OT, OT POC, post acute recs, sitting up for all meals, EOB/OOB mobility with assistance, home/fall safety.   Other Exercises: Pt requesting to walk to the bathroom for toileting. OT provided education re: safety with OOB mobility due to being very shaky & knees buckling with PT earlier today. Encouraged pt to use [facility] for safe toileting, however, pt reported he is unable to have BM at this time (gets the urge suddenly).   Shoulder Instructions      Home Living Family/patient expects to be discharged to:: Private residence Living Arrangements: Non-relatives/Friends Available Help at Discharge: Friend(s);Available PRN/intermittently;Family Type of Home: Mobile home Home Access: Stairs to enter Entrance Stairs-Number of Steps: 4 Entrance Stairs-Rails: Left Home Layout: One level     Bathroom Shower/Tub: Teacher, early years/pre: Standard Bathroom Accessibility: No   Home Equipment: Cane - single point;Rolling Walker (2 wheels);Shower seat   Additional Comments: multiple falls recently at home where patient had apparent syncopal episodes- patient says his shower seat is broken      Prior Functioning/Environment Prior Level of Function : Independent/Modified  Independent;Driving;History of Falls (last six months)             Mobility Comments: independent, drives ADLs Comments: independent        OT Problem List: Decreased strength;Decreased activity tolerance;Impaired balance (sitting and/or standing);Decreased coordination;Decreased safety awareness;Cardiopulmonary status limiting activity;Pain;Increased  edema;Obesity;Decreased knowledge of precautions      OT Treatment/Interventions: Self-care/ADL training;Therapeutic exercise;Neuromuscular education;Energy conservation;DME and/or AE instruction;Manual therapy;Modalities;Balance training;Patient/family education;Visual/perceptual remediation/compensation;Cognitive remediation/compensation;Therapeutic activities;Splinting    OT Goals(Current goals can be found in the care plan section) Acute Rehab OT Goals Patient Stated Goal: to walk OT Goal Formulation: With patient Time For Goal Achievement: 11/24/22 Potential to Achieve Goals: Fair   OT Frequency: Min 2X/week    Co-evaluation              AM-PAC OT "6 Clicks" Daily Activity     Outcome Measure Help from another person eating meals?: None Help from another person taking care of personal grooming?: A Little Help from another person toileting, which includes using toliet, bedpan, or urinal?: A Lot Help from another person bathing (including washing, rinsing, drying)?: A Lot Help from another person to put on and taking off regular upper body clothing?: A Little Help from another person to put on and taking off regular lower body clothing?: A Lot 6 Click Score: 16   End of Session Equipment Utilized During Treatment: Gait belt;Rolling walker (2 wheels);Oxygen Nurse Communication: Mobility status  Activity Tolerance: Patient tolerated treatment well;Patient limited by fatigue Patient left: in bed;with call bell/phone within reach;with bed alarm set;Other (comment) (NT present in room to give bed bath)  OT Visit Diagnosis: Muscle weakness (generalized) (M62.81);Pain;Unsteadiness on feet (R26.81); History of falling (Z91.81)  Pain - Right/Left:  (bilateral) Pain - part of body: Leg                Time: [id number] OT Time Calculation (min): 22 min Charges:  OT General Charges $OT Visit: 1 Visit OT Evaluation $OT Eval Moderate Complexity: 1 Mod  [facility],  OTR/L ascom [phone]  11/10/22, 5:06 PM

## 2022-11-10 NOTE — Progress Notes (Signed)
Hypoglycemic Event  CBG: 60 @2032   Treatment: 4 oz juice/soda  Symptoms: Shaky and Hungry  Follow-up CBG: Time:2059 CBG Result:53  Treatment: 4 oz juice/soda  Symptoms: Shaky  Follow-up CBG: Time:2118 CBG Result:107  Possible Reasons for Event: Inadequate meal intake and Medication regimen: Excessive Insulin Coverage  Comments/MD notified:K.Foust, NP of [facility] notified and orders received. See her note.    Sharlotte Alamo

## 2022-11-10 NOTE — Progress Notes (Signed)
Progress Note   Patient: Cody Alexander [id number] DOB: [birth year] DOA: 11/09/2022     1 DOS: the patient was seen and examined on 11/10/2022    Subjective:  Patient seen and examined this morning in the presence of patient's nurse at bedside. Still has some pain secondary to chest pain which he states is relieved by Percocet Denies any diaphoresis, nausea vomiting abdominal pain or worsening shortness of breath    Brief hospital course:  Cody Alexander is a 67 y.o. male with medical history significant of Combined CHF with recovered EF (EF 65% G1 DD 07/22/2022), COPD, CAD s/p stent x1, HTN, T2DM , class III obesity, OSA, recently hospitalized from 2/25 to 2/29 at [facility] for CHF exacerbation after presenting with a 20 pound weight gain believe related to diuretic nonadherence who presents to the ED with shortness of breath, lower extremity edema and piercing chest pain.  Patient states that when he left the hospital he was given a 1 week supply of Lasix to replace his Bumex but then ran out and had trouble getting a refill and as such has been out of diuretics for the past 2 weeks.  He denies cough fever or chills.  He complains of severe lower extremity pain which started 2 months ago but over the past few days has been worse. ED course and data review: BP 173/108 with mild tachypnea of 22 and otherwise normal vitals.  Troponin 100>104.   EKG, independently interpreted NSR at 85 with no acute ST-T wave changes.  CTA chest showed no PE   Assessment and Plan: Acute on chronic diastolic CHF (congestive heart failure) (HCC) likely secondary to medication nonadherence Review of recent discharge summary from [facility] reveals that patient has had several hospitalizations for CHF for similar reasons EF 65% G1 DD 07/22/2022 - Continue IV Lasix - Continue spironolactone, metoprolol, isosorbide - Daily weights with intake and output monitoring - Counseled on importance of adherence with medication  and diet Neurologist consulted we appreciate input   CAD (coronary artery disease) Atypical chest pain with elevated troponin Troponin 100>104>113> 99 and patient reports chest pain feeling like needles piercing his chest Suspect troponin elevation is related to demand ischemia CTA chest ruled out PE as etiology for his chest pain Patient had a similar pain when hospitalized at [facility] in February and was referred for outpatient stress test but did not get it - Continue to trend troponins - Continue metoprolol, isosorbide, atorvastatin - Nitroglycerin as needed chest pain - Follow-up echocardiogram to evaluate for wall motion abnormality - Cardiology consulted appreciate input   Bilateral leg pain Suspect secondary to a combination of diabetic neuropathy and swelling from CHF Patient was given Lyrica 50 mg twice daily when hospitalized in February Patient says oxycodone helped with his leg pain we will continue this Lower extremity venous Doppler showed no DVT CTA chest negative for PE - Will resume Lyrica   Essential hypertension Continue metoprolol and lisinopril   Chronic obstructive pulmonary disease (COPD) ([place]) Chronic respiratory failure with hypoxia Continue home inhalers DuoNebs as needed Continue supplemental oxygen   Type 2 diabetes mellitus with hyperglycemia, with long-term current use of insulin (HCC) Diabetic neuropathy Blood sugar 227.  A1c in February was over 10 Continue basal insulin Sliding scale insulin coverage Lyrica 50 mg twice daily   OSA (obstructive sleep apnea) CPAP nightly   Obesity, Class III, BMI 40-49.9 (morbid obesity) ([place]) Complicating factor to overall prognosis and care     Advance Care Planning:  Code Status: Full code   Consults: cardiology   Family Communication: No family at bedside   Data Reviewed: Cody Alexander results reviewed by me showing hypochloremic hyponatremia: Uremic hyponatremia   Disposition: Status is:  Inpatient Remains inpatient appropriate because: Still requiring IV medications and awaiting echocardiogram   Planned Discharge Destination: Ending clinical course   Physical Exam Vitals and nursing note reviewed.  Constitutional:      General: He is not in acute distress. HENT:     Head: Normocephalic and atraumatic.  Cardiovascular:     Rate and Rhythm: Normal rate and regular rhythm.     Heart sounds: Normal heart sounds.  Pulmonary:     Effort: Pulmonary effort is normal.     Breath sounds: Wheezing present but better.  Abdominal:     Palpations: Abdomen is soft.     Tenderness: There is no abdominal tenderness.  Musculoskeletal:  Lateral lower extremity pitting edema Neurological:     Mental Status: Mental status is at baseline.   Time spent: 40 minutes  Vitals:   11/10/22 0400 11/10/22 0446 11/10/22 0500 11/10/22 0923  BP: 118/75   129/88  Pulse: 72   75  Resp: 16   18  Temp:    98.7 F (37.1 C)  TempSrc:    Oral  SpO2: 96% 93%  99%  Weight:   (!) 144 kg   Height:         Author: Verline Lema, MD 11/10/2022 11:44 AM  For on call review [email or web address].

## 2022-11-10 NOTE — Progress Notes (Incomplete)
       CROSS COVER NOTE  NAME: Cody Alexander MRN: [id number] DOB : 07/27/1956 ATTENDING PHYSICIAN: Verline Lema, MD    Date of Service   11/10/2022   HPI/Events of Note   Report ***hypoglycemia 60, treated-->107 On Review of chart *** Bedside eval*** HPI***  Interventions   Assessment/Plan: 25% dose reduction tonight22U Lantus Consider decreasing meal time insulin, 16U Novolog X    *** professional thanks      To reach the provider On-Call:   7AM- 7PM see care teams to locate the attending and reach out to them via [email or web address] Password: [email or web address] 7PM-7AM contact night-coverage If you still have difficulty reaching the appropriate provider, please page the [facility] (Director on Call) for [facility] on amion for assistance  This document was prepared using Systems analyst and may include unintentional dictation errors.  Neomia Glass DNP, MBA, FNP-BC, PMHNP-BC Nurse Practitioner [facility] Pager [phone]

## 2022-11-10 NOTE — Consult Note (Incomplete)
CARDIOLOGY CONSULT NOTE               Patient ID: Cody Alexander MRN: [id number] DOB/AGE: 1955/10/31 67 y.o.  Admit date: 11/09/2022 Referring Physician *** Primary Physician *** Primary Cardiologist *** Reason for Consultation ***  HPI: ***  Review of systems complete and found to be negative unless listed above     Past Medical History:  Diagnosis Date   CHF (congestive heart failure) ([place])    COPD (chronic obstructive pulmonary disease) ([place])    Diabetes mellitus without complication ([place])    Hypertension    Neuropathy     Past Surgical History:  Procedure Laterality Date   LEFT HEART CATH AND CORONARY ANGIOGRAPHY Right 02/27/2018   Procedure: LEFT HEART CATH AND CORONARY ANGIOGRAPHY;  Surgeon: Dionisio David, MD;  Location: [facility];  Service: Cardiovascular;  Laterality: Right;    Medications Prior to Admission  Medication Sig Dispense Refill Last Dose   acetaminophen (TYLENOL) 500 MG tablet Take 1,000 mg by mouth every 6 (six) hours as needed for mild pain or moderate pain.      atorvastatin (LIPITOR) 40 MG tablet Take 40 mg by mouth daily. (Patient not taking: Reported on 06/19/2022)      budesonide-formoterol (SYMBICORT) 160-4.5 MCG/ACT inhaler Inhale 2 puffs into the lungs 2 (two) times daily. (Patient not taking: Reported on 06/19/2022)      furosemide (LASIX) 80 MG tablet Take 1 tablet (80 mg total) by mouth daily.      gabapentin (NEURONTIN) 600 MG tablet Take 600 mg by mouth 3 (three) times daily.      hydrOXYzine (ATARAX) 10 MG tablet Take 1 tablet (10 mg total) by mouth 3 (three) times daily as needed. Home med. 30 tablet 0    insulin detemir (LEVEMIR) 100 UNIT/ML FlexPen Inject 30 Units into the skin in the morning and at bedtime. 15 mL 11    insulin lispro (HUMALOG) 100 UNIT/ML KwikPen Inject 16 Units into the skin 3 (three) times daily with meals.      ipratropium-albuterol (DUONEB) 0.5-2.5 (3) MG/3ML SOLN Take 3 mLs by nebulization every  6 (six) hours as needed. 120 mL 1    isosorbide dinitrate (ISORDIL) 30 MG tablet Take 30 mg by mouth every morning. (Patient not taking: Reported on 06/19/2022)      lisinopril (ZESTRIL) 20 MG tablet Take 20 mg by mouth daily.      metFORMIN (GLUCOPHAGE) 1000 MG tablet Take 1,000 mg by mouth 2 (two) times daily. (Patient not taking: Reported on 06/19/2022)      metoprolol tartrate (LOPRESSOR) 25 MG tablet Take 25 mg by mouth 2 (two) times daily. (Patient not taking: Reported on 06/19/2022)      PROAIR HFA 108 (90 Base) MCG/ACT inhaler Inhale 2 puffs into the lungs every 4 (four) hours as needed for wheezing. 18 g 0    spironolactone (ALDACTONE) 25 MG tablet Take 12.5 mg by mouth daily. (Patient not taking: Reported on 06/19/2022)      terbinafine (LAMISIL) 250 MG tablet Take 250 mg by mouth daily.      traMADol (ULTRAM) 50 MG tablet Take 50 mg by mouth 2 (two) times daily as needed.      Social History   Socioeconomic History   Marital status: Single    Spouse name: Not on file   Number of children: Not on file   Years of education: Not on file   Highest education level: Not on file  Occupational History  Not on file  Tobacco Use   Smoking status: Former    Types: Cigarettes   Smokeless tobacco: Never  Vaping Use   Vaping Use: Never used  Substance and Sexual Activity   Alcohol use: No   Drug use: Never   Sexual activity: Not on file  Other Topics Concern   Not on file  Social History Narrative   Not on file   Social Determinants of Health   Financial Resource Strain: Not on file  Food Insecurity: No Food Insecurity (11/10/2022)   Hunger Vital Sign    Worried About Running Out of Food in the Last Year: Never true    Ran Out of Food in the Last Year: Never true  Transportation Needs: No Transportation Needs (11/10/2022)   PRAPARE - Hydrologist (Medical): No    Lack of Transportation (Non-Medical): No  Physical Activity: Not on file  Stress:  Not on file  Social Connections: Not on file  Intimate Partner Violence: Not At Risk (11/10/2022)   Humiliation, Afraid, Rape, and Kick questionnaire    Fear of Current or Ex-Partner: No    Emotionally Abused: No    Physically Abused: No    Sexually Abused: No    Family History  Problem Relation Age of Onset   Stroke Mother    Hypertension Father       Review of systems complete and found to be negative unless listed above      PHYSICAL EXAM  General: Well developed, well nourished, in no acute distress HEENT:  Normocephalic and atramatic Neck:  No JVD.  Lungs: Clear bilaterally to auscultation and percussion. Heart: HRRR . Normal S1 and S2 without gallops or murmurs.  Abdomen: Bowel sounds are positive, abdomen soft and non-tender  Msk:  Back normal, normal gait. Normal strength and tone for age. Extremities: No clubbing, cyanosis or edema.   Neuro: Alert and oriented X 3. Psych:  Good affect, responds appropriately  Labs:   Lab Results  Component Value Date   WBC 9.0 11/10/2022   HGB 13.2 11/10/2022   HCT 42.9 11/10/2022   MCV 94.3 11/10/2022   PLT 244 11/10/2022    Recent Labs  Lab 11/10/22 0036  NA 134*  K 3.6  CL 90*  CO2 41*  BUN 8  CREATININE 0.83  CALCIUM 8.3*  GLUCOSE 214*   Lab Results  Component Value Date   CKTOTAL 151 01/06/2021   CKMB 1.3 08/20/2013   TROPONINI 0.09 (HH) 02/26/2018    Lab Results  Component Value Date   CHOL 148 01/28/2022   CHOL 173 01/04/2021   CHOL 131 02/26/2018   Lab Results  Component Value Date   HDL 39 (L) 01/28/2022   HDL 54 01/04/2021   HDL 30 (L) 02/26/2018   Lab Results  Component Value Date   LDLCALC 93 01/28/2022   LDLCALC 103 (H) 01/04/2021   LDLCALC 79 02/26/2018   Lab Results  Component Value Date   TRIG 82 01/28/2022   TRIG 82 01/04/2021   TRIG 109 02/26/2018   Lab Results  Component Value Date   CHOLHDL 3.8 01/28/2022   CHOLHDL 3.2 01/04/2021   CHOLHDL 4.4 02/26/2018   No  results found for: "LDLDIRECT"    Radiology: US Venous Img Lower Bilateral (DVT)  Result Date: 11/10/2022 CLINICAL DATA:  Leg pain EXAM: BILATERAL LOWER EXTREMITY VENOUS DOPPLER ULTRASOUND TECHNIQUE: Gray-scale sonography with graded compression, as well as color Doppler and duplex ultrasound were performed  to evaluate the lower extremity deep venous systems from the level of the common femoral vein and including the common femoral, femoral, profunda femoral, popliteal and calf veins including the posterior tibial, peroneal and gastrocnemius veins when visible. The superficial great saphenous vein was also interrogated. Spectral Doppler was utilized to evaluate flow at rest and with distal augmentation maneuvers in the common femoral, femoral and popliteal veins. COMPARISON:  None Available. FINDINGS: RIGHT LOWER EXTREMITY Common Femoral Vein: No evidence of thrombus. Normal compressibility, respiratory phasicity and response to augmentation. Saphenofemoral Junction: No evidence of thrombus. Normal compressibility and flow on color Doppler imaging. Profunda Femoral Vein: No evidence of thrombus. Normal compressibility and flow on color Doppler imaging. Femoral Vein: No evidence of thrombus. Normal compressibility, respiratory phasicity and response to augmentation. Popliteal Vein: No evidence of thrombus. Normal compressibility, respiratory phasicity and response to augmentation. Calf Veins: No evidence of thrombus. Normal compressibility and flow on color Doppler imaging. Superficial Great Saphenous Vein: No evidence of thrombus. Normal compressibility. Venous Reflux:  None. Other Findings:  None. LEFT LOWER EXTREMITY Common Femoral Vein: No evidence of thrombus. Normal compressibility, respiratory phasicity and response to augmentation. Saphenofemoral Junction: No evidence of thrombus. Normal compressibility and flow on color Doppler imaging. Profunda Femoral Vein: No evidence of thrombus. Normal  compressibility and flow on color Doppler imaging. Femoral Vein: No evidence of thrombus. Normal compressibility, respiratory phasicity and response to augmentation. Popliteal Vein: No evidence of thrombus. Normal compressibility, respiratory phasicity and response to augmentation. Calf Veins: No evidence of thrombus. Normal compressibility and flow on color Doppler imaging. Superficial Great Saphenous Vein: No evidence of thrombus. Normal compressibility. Venous Reflux:  None. Other Findings:  None. IMPRESSION: No evidence of deep venous thrombosis in either lower extremity. Electronically Signed   By: Inez Catalina M.D.   On: 11/10/2022 11:32   CT Angio Chest PE W and/or Wo Contrast  Result Date: 11/09/2022 CLINICAL DATA:  Pulmonary embolism (PE) suspected, low to intermediate prob, positive D-dimer EXAM: CT ANGIOGRAPHY CHEST WITH CONTRAST TECHNIQUE: Multidetector CT imaging of the chest was performed using the standard protocol during bolus administration of intravenous contrast. Multiplanar CT image reconstructions and MIPs were obtained to evaluate the vascular anatomy. RADIATION DOSE REDUCTION: This exam was performed according to the departmental dose-optimization program which includes automated exposure control, adjustment of the mA and/or kV according to patient size and/or use of iterative reconstruction technique. CONTRAST:  166mL OMNIPAQUE IOHEXOL 350 MG/ML SOLN COMPARISON:  06/19/2022 FINDINGS: Cardiovascular: There is adequate opacification the pulmonary arterial tree. No intraluminal filling defect is identified through the segmental level to suggest acute pulmonary embolus. The central pulmonary arteries are enlarged in keeping with changes of pulmonary arterial hypertension, similar to prior examination. Mild multi-vessel coronary artery calcification. Mild cardiomegaly, similar to prior examination with asymmetric left ventricular hypertrophy with thinning at the left ventricular apex  suggesting ischemia or prior myocardial infarction in this location. No pericardial effusion. Mild atherosclerotic calcification within the thoracic aorta. No aortic aneurysm. Mediastinum/Nodes: No enlarged mediastinal, hilar, or axillary lymph nodes. Thyroid gland, trachea, and esophagus demonstrate no significant findings. Lungs/Pleura: Mild bibasilar atelectasis. No superimposed confluent pulmonary infiltrate. No pneumothorax or pleural effusion. Bronchial wall thickening is noted centrally in keeping with changes of airway inflammation. Upper Abdomen: No acute abnormality. Musculoskeletal: No chest wall abnormality. No acute or significant osseous findings. Review of the MIP images confirms the above findings. IMPRESSION: 1. No pulmonary embolus. 2. Morphologic changes in keeping with pulmonary arterial hypertension, similar to prior examination.  3. Mild multi-vessel coronary artery calcification. Mild cardiomegaly with asymmetric left ventricular hypertrophy with thinning at the left ventricular apex suggesting ischemia or prior myocardial infarction in this location. 4. Bronchial wall thickening centrally in keeping with changes of airway inflammation. Aortic Atherosclerosis (ICD10-I70.0). Electronically Signed   By: Fidela Salisbury M.D.   On: 11/09/2022 21:57   DG Chest 2 View  Result Date: 11/09/2022 CLINICAL DATA:  Shortness of breath and chest pain EXAM: CHEST - 2 VIEW COMPARISON:  03/14/2022 FINDINGS: Mild bronchitic changes. No acute airspace disease or pleural effusion. Scarring or atelectasis at the right base. Borderline cardiomegaly. No pneumothorax IMPRESSION: Borderline cardiomegaly. Mild bronchitic changes with scarring or atelectasis at the right base Electronically Signed   By: Donavan Foil M.D.   On: 11/09/2022 18:05    EKG: ***  ASSESSMENT AND PLAN:  ***  Signed: Yolonda Kida MD, PHD, [facility] 11/10/2022, 12:45 PM

## 2022-11-10 NOTE — Plan of Care (Signed)
  Problem: Pain Managment: Goal: General experience of comfort will improve Outcome: Progressing   Problem: Safety: Goal: Ability to remain free from injury will improve Outcome: Progressing   Problem: Skin Integrity: Goal: Risk for impaired skin integrity will decrease Outcome: Progressing   Problem: Education: Goal: Ability to demonstrate management of disease process will improve Outcome: Progressing Goal: Ability to verbalize understanding of medication therapies will improve Outcome: Progressing   Problem: Activity: Goal: Capacity to carry out activities will improve Outcome: Progressing

## 2022-11-10 NOTE — Evaluation (Signed)
Physical Therapy Evaluation Patient Details Name: Cody Alexander MRN: [id number] DOB: [birth year] Today's Date: 11/10/2022  History of Present Illness  Cody Alexander is a 67 y.o. 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  Patient states that when he left the hospital he was given a 1 week supply of Lasix to replace his Bumex but then ran out and had trouble getting a refill and as such has been out of diuretics for the past 2 weeks.  He denies cough fever or chills.  He complains of severe lower extremity pain which started 2 months ago but over the past few days has been worse.  Clinical Impression  Patient received in bed, he is pleasant, very talkative. Patient performed bed mobility with mod I-heavy use of bed rails and increased effort needed to get seated on side of bed. Patient has a lot of jerking motions with mobility. Stood with min A and attempted to walk, but legs were jerking and buckling too much to safely continue. Patient attempted standing 2 more times with similar result. Patient is unsafe to ambulate at this time. He will continue to benefit from skilled PT to improve functional independence and safety.   Says he lives with 38 something year old woman in mobile home.        Recommendations for follow up therapy are one component of a multi-disciplinary discharge planning process, led by the attending physician.  Recommendations may be updated based on patient status, additional functional criteria and insurance authorization.  Follow Up Recommendations Skilled nursing-short term rehab (<3 hours/day) Can patient  physically be transported by private vehicle: No    Assistance Recommended at Discharge Frequent or constant Supervision/Assistance  Patient can return home with the following  A lot of help with walking and/or transfers;A lot of help with bathing/dressing/bathroom;Help with stairs or ramp for entrance;Assist for transportation;Assistance with cooking/housework    Equipment Recommendations Rolling walker (2 wheels)  Recommendations for Other Services       Functional Status Assessment Patient has had a recent decline in their functional status and demonstrates the ability to make significant improvements in function in a reasonable and predictable amount of time.     Precautions / Restrictions Precautions Precautions: Fall Restrictions Weight Bearing Restrictions: No      Mobility  Bed Mobility Overal bed mobility: Needs Assistance Bed Mobility: Supine to Sit, Sit to Supine     Supine to sit: Supervision, HOB elevated Sit to supine: Supervision   General bed mobility comments: Heavy use of bed rails, increased effort to get seated edge of bed    Transfers Overall transfer level: Needs assistance Equipment used: None Transfers: Sit to/from Stand Sit to Stand: Min assist           General transfer comment: Patient VERY jerky with mobility including sit to stand.    Ambulation/Gait Ambulation/Gait assistance: Min assist Gait Distance (Feet): 4 Feet Assistive device: None Gait Pattern/deviations: Step-through pattern, Decreased step length - right, Decreased step length - left, Wide base of support, Knees buckling Gait velocity: decr     General Gait Details: Jerky movements, knees buckling at times. Patient holding to  bed, table etc. Unable to walk more than a few feet-unsafe  Stairs            Wheelchair Mobility    Modified Rankin (Stroke Patients Only)       Balance Overall balance assessment: Needs assistance Sitting-balance support: Feet  supported Sitting balance-Leahy Scale: Fair     Standing balance support: Bilateral upper extremity supported, During functional activity, Reliant on assistive device for balance Standing balance-Leahy Scale: Poor Standing balance comment: poor balance due to jerking motions                             Pertinent Vitals/Pain Pain Assessment Pain Assessment: Faces Faces Pain Scale: Hurts little more Pain Location: B LEs Pain Descriptors / Indicators: Discomfort, Tightness Pain Intervention(s): Monitored during session, Repositioned    Home Living Family/patient expects to be discharged to:: Private residence Living Arrangements: Non-relatives/Friends Available Help at Discharge: Friend(s);Available PRN/intermittently;Family Type of Home: Mobile home Home Access: Stairs to enter Entrance Stairs-Rails: Left Entrance Stairs-Number of Steps: 4   Home Layout: One level Home Equipment: Cane - single Barista (2 wheels);Shower seat Additional Comments: multiple falls recently at home where patient had apparent syncopal episodes- patient says his shower seat is broken    Prior Function Prior Level of Function : Independent/Modified Independent;Driving;History of Falls (last six months)             Mobility Comments: independent, drives ADLs Comments: independent     Hand Dominance   Dominant Hand: Right    Extremity/Trunk Assessment   Upper Extremity Assessment Upper Extremity Assessment: Generalized weakness    Lower Extremity Assessment Lower Extremity Assessment: Generalized weakness    Cervical / Trunk Assessment Cervical / Trunk Assessment: Normal  Communication   Communication: No difficulties;Other (comment) (VERY talkative)  Cognition Arousal/Alertness: Awake/alert Behavior During Therapy: WFL for tasks assessed/performed Overall Cognitive Status: Within Functional Limits for tasks assessed                                           General Comments      Exercises     Assessment/Plan    PT Assessment Patient needs continued PT services  PT Problem List Decreased strength;Decreased activity tolerance;Decreased balance;Decreased mobility;Decreased safety awareness;Decreased knowledge of use of DME;Pain       PT Treatment Interventions DME instruction;Gait training;Stair training;Functional mobility training;Therapeutic activities;Patient/family education;Balance training;Therapeutic exercise    PT Goals (Current goals can be found in the Care Plan section)  Acute Rehab PT Goals Patient Stated Goal: be able to walk PT Goal Formulation: With patient Time For Goal Achievement: 11/24/22 Potential to Achieve Goals: Fair    Frequency Min 2X/week     Co-evaluation               AM-PAC PT "6 Clicks" Mobility  Outcome Measure Help needed turning from your back to your side while in a flat bed without using bedrails?: A Little Help needed moving from lying on your back to sitting on the side of a flat bed without using bedrails?: A Lot Help needed moving to and from a bed to a chair (including a wheelchair)?: A Lot Help needed standing up from a chair using your arms (e.g., wheelchair or bedside chair)?: A Little Help needed to walk in hospital room?: A Lot Help needed climbing 3-5 steps  with a railing? : Total 6 Click Score: 13    End of Session Equipment Utilized During Treatment: Oxygen Activity Tolerance: Patient limited by fatigue;Other (comment) (unsafe) Patient left: in bed;with call bell/phone within reach;with nursing/sitter in room Nurse Communication: Mobility status PT Visit Diagnosis: Other abnormalities of gait and mobility (R26.89);Difficulty in walking, not elsewhere classified (R26.2);Muscle weakness (generalized) (M62.81);Unsteadiness on feet (R26.81);History of falling (Z91.81);Pain Pain - Right/Left:  (B) Pain - part of body: Leg    Time: 1422-1449 PT Time  Calculation (min) (ACUTE ONLY): 27 min   Charges:   PT Evaluation $PT Eval Moderate Complexity: 1 Mod PT Treatments $Therapeutic Activity: 8-22 mins        Lanea Vankirk, PT, GCS 11/10/22,3:05 PM

## 2022-11-10 NOTE — Progress Notes (Signed)
   11/10/22 1300  Spiritual Encounters  Type of Visit Initial  Care provided to: Patient  Referral source Patient request  Reason for visit Religious ritual  OnCall Visit Yes  Interventions  Spiritual Care Interventions Made Prayer  Intervention Outcomes  Outcomes Reduced anxiety   Chaplain responded to request to provide spiritual comfort and support to patient facing many health challenges. Patient requested prayer and assurance.

## 2022-11-11 ENCOUNTER — Inpatient Hospital Stay (HOSPITAL_COMMUNITY)
Admit: 2022-11-11 | Discharge: 2022-11-11 | Disposition: A | Discharge status: Home / Self Care | Payer: 59 | Attending: Internal Medicine | Admitting: Internal Medicine

## 2022-11-11 DIAGNOSIS — I5033 Acute on chronic diastolic (congestive) heart failure: Secondary | ICD-10-CM | POA: Diagnosis not present

## 2022-11-11 DIAGNOSIS — R079 Chest pain, unspecified: Secondary | ICD-10-CM

## 2022-11-11 LAB — ECHOCARDIOGRAM COMPLETE
AR max vel: 3.03 cm2
AV Area VTI: 2.9 cm2
AV Area mean vel: 3.17 cm2
AV Mean grad: 6 mmHg
AV Peak grad: 11 mmHg
Ao pk vel: 1.66 m/s
Area-P 1/2: 2.6 cm2
Height: 73 in
MV VTI: 3.84 cm2
S' Lateral: 2.7 cm
Weight: 5139.36 oz

## 2022-11-11 LAB — HEMOGLOBIN A1C
Hgb A1c MFr Bld: 11.2 % — ABNORMAL HIGH (ref 4.8–5.6)
Mean Plasma Glucose: 275 mg/dL

## 2022-11-11 LAB — GLUCOSE, CAPILLARY
Glucose-Capillary: 218 mg/dL — ABNORMAL HIGH (ref 70–99)
Glucose-Capillary: 199 mg/dL — ABNORMAL HIGH (ref 70–99)
Glucose-Capillary: 76 mg/dL (ref 70–99)
Glucose-Capillary: 180 mg/dL — ABNORMAL HIGH (ref 70–99)

## 2022-11-11 MED ORDER — OXYCODONE HCL 5 MG PO TABS
15.0000 mg | ORAL_TABLET | Freq: Four times a day (QID) | ORAL | Status: DC | PRN
  Administered 2022-11-11 – 2022-11-15 (×12): 15 mg via ORAL
  Filled 2022-11-11 (×12): qty 3

## 2022-11-11 MED ORDER — INSULIN ASPART 100 UNIT/ML IJ SOLN
0.0000 [IU] | Freq: Three times a day (TID) | INTRAMUSCULAR | Status: DC
  Administered 2022-11-11: 2 [IU] via SUBCUTANEOUS
  Administered 2022-11-12: 7 [IU] via SUBCUTANEOUS
  Administered 2022-11-12 (×2): 5 [IU] via SUBCUTANEOUS
  Administered 2022-11-13: 3 [IU] via SUBCUTANEOUS
  Administered 2022-11-13: 5 [IU] via SUBCUTANEOUS
  Administered 2022-11-13: 2 [IU] via SUBCUTANEOUS
  Administered 2022-11-14: 1 [IU] via SUBCUTANEOUS
  Administered 2022-11-14 – 2022-11-15 (×3): 2 [IU] via SUBCUTANEOUS
  Filled 2022-11-11 (×10): qty 1

## 2022-11-11 MED ORDER — PREDNISONE 20 MG PO TABS
40.0000 mg | ORAL_TABLET | Freq: Every day | ORAL | Status: AC
  Administered 2022-11-11 – 2022-11-15 (×5): 40 mg via ORAL
  Filled 2022-11-11 (×6): qty 2

## 2022-11-11 MED ORDER — INSULIN ASPART 100 UNIT/ML IJ SOLN
0.0000 [IU] | Freq: Every day | INTRAMUSCULAR | Status: DC
  Administered 2022-11-13: 2 [IU] via SUBCUTANEOUS
  Administered 2022-11-14: 4 [IU] via SUBCUTANEOUS
  Filled 2022-11-11 (×2): qty 1

## 2022-11-11 MED ORDER — ASPIRIN 81 MG PO TBEC
81.0000 mg | DELAYED_RELEASE_TABLET | Freq: Every day | ORAL | Status: DC
  Administered 2022-11-11 – 2022-11-15 (×5): 81 mg via ORAL
  Filled 2022-11-11 (×6): qty 1

## 2022-11-11 MED ORDER — IPRATROPIUM-ALBUTEROL 0.5-2.5 (3) MG/3ML IN SOLN
3.0000 mL | Freq: Two times a day (BID) | RESPIRATORY_TRACT | Status: DC
  Administered 2022-11-11 – 2022-11-15 (×7): 3 mL via RESPIRATORY_TRACT
  Filled 2022-11-11 (×8): qty 3

## 2022-11-11 NOTE — NC FL2 (Signed)
[place] LEVEL OF CARE FORM     IDENTIFICATION  [name] Birthdate: 20-Mar-1956 Sex: male Admission Date (Current Location): 11/09/2022  [place] and [place] Number:  Selena Lesser [id number] [place] and Address:  [facility], [address], [place], [place] [address]      Provider Number: [id number]  Attending Physician Name and Address:  Verline Lema, MD  Relative Name and Phone Number:  Vela Prose   [phone]    Current Level of Care: Hospital Recommended Level of Care: [place] Prior Approval Number:    Date Approved/Denied:   PASRR Number: [id number]  Discharge Plan: SNF    Current Diagnoses: Patient Active Problem List   Diagnosis Date Noted   Bilateral leg pain 11/10/2022   Chest pain    Syncope and collapse    Chronic back pain    Acute on chronic respiratory failure ([place]) 03/14/2022   Diabetes mellitus without complication (HCC)    Acute exacerbation of chronic low back pain    UTI (urinary tract infection)    HLD (hyperlipidemia) 01/27/2022   CAD (coronary artery disease) 01/27/2022   Fall at home, initial encounter 01/27/2022   Leg weakness, bilateral 01/27/2022   Hyperkalemia    Acute CHF (congestive heart failure) ([place]) 08/15/2021   Benign prostatic hyperplasia with urinary hesitancy    Lower abdominal pain    Elevated troponin    Rash    Uncontrolled type 2 diabetes mellitus with hyperglycemia (HCC) 01/04/2021   Acute on chronic diastolic CHF (congestive heart failure) ([place]) 01/04/2021   CHF (congestive heart failure), NYHA class I, acute, diastolic (HCC) [id number]   OSA (obstructive sleep apnea) 01/04/2021   Obesity (BMI 30-39.9) 01/04/2021   Chronic respiratory failure with hypoxia (HCC) 01/04/2021   Acute on chronic congestive heart failure (HCC)    Acute on chronic respiratory failure with hypoxia ([place]) 06/20/2020   NSTEMI (non-ST elevated myocardial  infarction) ([place]) 02/25/2018   Obesity, Class III, BMI 40-49.9 (morbid obesity) ([place]) 01/19/2017   Chronic obstructive pulmonary disease (COPD) ([place]) 05/13/2014   Type 2 diabetes mellitus with hyperglycemia, with long-term current use of insulin ([place]) 05/13/2014   Essential hypertension 05/13/2014    Orientation RESPIRATION BLADDER Height & Weight     Self, Time, Situation, Place  O2 (4L) Continent Weight: (!) 321 lb 3.4 oz (145.7 kg) Height:  6\' 1"  (185.4 cm)  BEHAVIORAL SYMPTOMS/MOOD NEUROLOGICAL BOWEL NUTRITION STATUS      Continent Diet  AMBULATORY STATUS COMMUNICATION OF NEEDS Skin   Limited Assist Verbally Normal                       Personal Care Assistance Level of Assistance  Bathing, Feeding, Dressing Bathing Assistance: Limited assistance Feeding assistance: Independent Dressing Assistance: Limited assistance     Functional Limitations Info  Sight, Hearing, Speech Sight Info: Adequate Hearing Info: Adequate Speech Info: Adequate    SPECIAL CARE FACTORS FREQUENCY  OT (By licensed OT), PT (By licensed PT)     PT Frequency: Minimum 5x a week OT Frequency: Minimum 5X a week            Contractures Contractures Info: Not present    Additional Factors Info  Code Status, Allergies, Insulin Sliding Scale Code Status Info: Full Code Allergies Info: Erythromycin   Insulin Sliding Scale Info: insulin aspart (novoLOG) injection 0-9 Units 3x a day with meals       Current Medications (11/11/2022):  This is  the current hospital active medication list Current Facility-Administered Medications  Medication Dose Route Frequency Provider Last Rate Last Admin   acetaminophen (TYLENOL) tablet 650 mg  650 mg Oral Q6H PRN Athena Masse, MD   650 mg at 11/11/22 1655   Or   acetaminophen (TYLENOL) suppository 650 mg  650 mg Rectal Q6H PRN Athena Masse, MD       aspirin EC tablet 81 mg  81 mg Oral Daily Marguerita Merles T, MD   81 mg at 11/11/22 1655   atorvastatin  (LIPITOR) tablet 40 mg  40 mg Oral Daily Judd Gaudier V, MD   40 mg at 11/11/22 0945   enoxaparin (LOVENOX) injection 72.5 mg  0.5 mg/kg Subcutaneous Q24H Judd Gaudier V, MD   72.5 mg at 11/11/22 0945   furosemide (LASIX) injection 80 mg  80 mg Intravenous BID Judd Gaudier V, MD   80 mg at 11/11/22 1655   insulin aspart (novoLOG) injection 0-5 Units  0-5 Units Subcutaneous QHS Marguerita Merles T, MD       insulin aspart (novoLOG) injection 0-9 Units  0-9 Units Subcutaneous TID WC Marguerita Merles T, MD   2 Units at 11/11/22 1201   insulin aspart (novoLOG) injection 16 Units  16 Units Subcutaneous TID WC Judd Gaudier V, MD   16 Units at 11/11/22 1201   insulin detemir (LEVEMIR) injection 30 Units  30 Units Subcutaneous [id number] Foust, Katy L, NP       ipratropium-albuterol (DUONEB) 0.5-2.5 (3) MG/3ML nebulizer solution 3 mL  3 mL Nebulization BID Marguerita Merles T, MD       isosorbide dinitrate (ISORDIL) tablet 30 mg  30 mg Oral q morning Judd Gaudier V, MD   30 mg at 11/11/22 0945   lisinopril (ZESTRIL) tablet 20 mg  20 mg Oral Daily Judd Gaudier V, MD   20 mg at 11/11/22 0951   metoprolol tartrate (LOPRESSOR) tablet 25 mg  25 mg Oral BID Athena Masse, MD   25 mg at 11/11/22 0945   mometasone-formoterol (DULERA) 200-5 MCG/ACT inhaler 2 puff  2 puff Inhalation BID Athena Masse, MD   2 puff at 11/11/22 0947   morphine (PF) 2 MG/ML injection 2 mg  2 mg Intravenous Q2H PRN Athena Masse, MD   2 mg at 11/10/22 0340   nitroGLYCERIN (NITROSTAT) SL tablet 0.4 mg  0.4 mg Sublingual Q5 min PRN Athena Masse, MD   0.4 mg at 11/10/22 0034   ondansetron (ZOFRAN) tablet 4 mg  4 mg Oral Q6H PRN Athena Masse, MD       Or   ondansetron [facility]) injection 4 mg  4 mg Intravenous Q6H PRN Athena Masse, MD       oxyCODONE (Oxy IR/ROXICODONE) immediate release tablet 15 mg  15 mg Oral Q6H PRN Marguerita Merles T, MD   15 mg at 11/11/22 1655   predniSONE (DELTASONE) tablet 40 mg  40 mg Oral Q breakfast Marguerita Merles T,  MD   40 mg at 11/11/22 1735   pregabalin (LYRICA) capsule 50 mg  50 mg Oral BID Athena Masse, MD   50 mg at 11/10/22 2313   spironolactone (ALDACTONE) tablet 12.5 mg  12.5 mg Oral Daily Athena Masse, MD   12.5 mg at 11/11/22 [id number]     Discharge Medications: Please see discharge summary for a list of discharge medications.  Relevant Imaging Results:  Relevant Lab Results:   Additional Information SSN [id number]  Nathian Stencil,  Jones Broom, LCSW

## 2022-11-11 NOTE — TOC Initial Note (Signed)
Transition of Care [facility]) - Initial/Assessment Note    Patient Details  Name: Cody Alexander MRN: [id number] Date of Birth: 01/27/1956  Transition of Care [facility]) CM/SW Contact:    Ross Ludwig, LCSW Phone Number: 11/11/2022, 8:48 PM  Clinical Narrative:                  [place] is from home, patient would like to go home if possible,and does not want to go to SNF.  Patient is participating with therapy, and on 4L of oxygen.  Patient is very talkative, and working hard with therapy.  TOC to continue to follow patient's progress throughout discharge planning.    Expected Discharge Plan: Skilled Nursing Facility Barriers to Discharge: Continued Medical Work up   Patient Goals and CMS Choice Patient states their goals for this hospitalization and ongoing recovery are:: To go to SNF fo rshort term rehab, then return back home. CMS Medicare.gov Compare Post Acute Care list provided to:: Patient Choice offered to / list presented to : Patient [place] ownership interest in [facility].provided to:: Patient    Expected Discharge Plan and [place] Choice: [place] arrangements for the past 2 months: [place]                                      Prior Living Arrangements/Services Living arrangements for the past 2 months: [place] Lives with:: Self Patient language and need for interpreter reviewed:: Yes Do you feel safe going back to the place where you live?: No   Patient feels he needs some rehab before returning back home.  Need for Family Participation in Patient Care: No (Comment) Care giver support system in place?: No (comment)   Criminal Activity/Legal Involvement Pertinent to Current Situation/Hospitalization: No - Comment as needed  Activities of Daily Living Home Assistive Devices/Equipment: Cane (specify quad or straight), Wheelchair, Environmental consultant (specify type), Oxygen, BIPAP,  Nebulizer, CBG Meter ADL Screening (condition at time of admission) Patient's cognitive ability adequate to safely complete daily activities?: Yes Is the patient deaf or have difficulty hearing?: No Does the patient have difficulty seeing, even when wearing glasses/contacts?: No Does the patient have difficulty concentrating, remembering, or making decisions?: No Patient able to express need for assistance with ADLs?: Yes Does the patient have difficulty dressing or bathing?: Yes Independently performs ADLs?: Yes (appropriate for developmental age) Does the patient have difficulty walking or climbing stairs?: Yes Weakness of Legs: Both Weakness of Arms/Hands: None  Permission Sought/Granted Permission sought to share information with : Case Manager, Family Supports, Customer service manager Permission granted to share information with : Yes, Verbal Permission Granted  Share Information with NAME: Cody Alexander   [phone]  Permission granted to share info w AGENCY: SNF admissions        Emotional Assessment Appearance:: Appears stated age   Affect (typically observed): Accepting, Calm, Pleasant Orientation: : Oriented to Self, Oriented to Place, Oriented to  Time, Oriented to Situation Alcohol / Substance Use: Not Applicable Psych Involvement: No (comment)  Admission diagnosis:  Acute on chronic diastolic (congestive) heart failure (HCC) [I50.33] Patient Active Problem List   Diagnosis Date Noted   Bilateral leg pain 11/10/2022   Chest pain    Syncope and collapse    Chronic back pain    Acute on chronic respiratory failure ([place]) 03/14/2022  Diabetes mellitus without complication (HCC)    Acute exacerbation of chronic low back pain    UTI (urinary tract infection)    HLD (hyperlipidemia) 01/27/2022   CAD (coronary artery disease) 01/27/2022   Fall at home, initial encounter 01/27/2022   Leg weakness, bilateral 01/27/2022   Hyperkalemia    Acute CHF  (congestive heart failure) ([place]) 08/15/2021   Benign prostatic hyperplasia with urinary hesitancy    Lower abdominal pain    Elevated troponin    Rash    Uncontrolled type 2 diabetes mellitus with hyperglycemia ([place]) 01/04/2021   Acute on chronic diastolic CHF (congestive heart failure) ([place]) 01/04/2021   CHF (congestive heart failure), NYHA class I, acute, diastolic ([place]) [id number]   OSA (obstructive sleep apnea) 01/04/2021   Obesity (BMI 30-39.9) 01/04/2021   Chronic respiratory failure with hypoxia ([place]) 01/04/2021   Acute on chronic congestive heart failure ([place])    Acute on chronic respiratory failure with hypoxia ([place]) 06/20/2020   NSTEMI (non-ST elevated myocardial infarction) ([place]) 02/25/2018   Obesity, Class III, BMI 40-49.9 (morbid obesity) ([place]) 01/19/2017   Chronic obstructive pulmonary disease (COPD) ([place]) 05/13/2014   Type 2 diabetes mellitus with hyperglycemia, with long-term current use of insulin ([place]) 05/13/2014   Essential hypertension 05/13/2014   PCP:  [place]:   [id number], [place] [address] [place] [address] Phone: [phone] Fax: [phone]  [id number], [place] [address] Phone: [phone] Fax: [phone]     Social Determinants of Health (SDOH) Social History: SDOH Screenings   Food Insecurity: No Food Insecurity (11/10/2022)  Housing: Low Risk  (11/10/2022)  Transportation Needs: No Transportation Needs (11/10/2022)  Utilities: Not At Risk (11/10/2022)  Tobacco Use: Medium Risk (11/09/2022)   SDOH Interventions:     Readmission Risk Interventions    06/25/2022   10:13 AM  Readmission Risk Prevention Plan  Transportation Screening Complete  PCP or Specialist Appt within 3-5 Days Complete  HRI or [place] Complete  Social Work Consult for [place] Planning/Counseling Complete   Palliative Care Screening Not Applicable  Medication Review Press photographer) Complete

## 2022-11-11 NOTE — Progress Notes (Signed)
*  PRELIMINARY RESULTS* Echocardiogram 2D Echocardiogram has been performed.  Cody Alexander 11/11/2022, 1:54 PM

## 2022-11-11 NOTE — Consult Note (Signed)
   Heart Failure Nurse Navigator Note  HFpEF 70-75%.  Right ventricular systolic function is normal.  Left ventricular diastolic parameters were normal.  Echocardiogram to be performed on this admission results are pending.  He presented to the emergency room with complaints of shortness of breath, leg swelling, 20 pound weight gain and chest discomfort.  BNP was 82.7.  CT of the chest revealed cardiomegaly.  Comorbidities:  Hypertension Type 2 diabetes Obesity Obstructive sleep apnea COPD Coronary artery disease with stenting Obstructive sleep apnea with CPAP use  Medications:  Atorvastatin 40 mg daily Furosemide 80 mg IV 2 times a day Isosorbide dinitrate 30 mg every a.m. Lisinopril 20 mg daily Metoprolol tartrate 25 mg 2 times a day Spironolactone 12 and half milligrams daily  Labs:  Sodium 134, potassium 3.6, chloride 90, CO2 41, BUN 8, creatinine 0.83, estimated GFR 60, hemoglobin 13.2, hematocrit 42.9, troponin 100, 104, 113, 99.  Weight is 145.7 kg Intake 1320 mL Output 2500 mL  Initial meeting with patient on this admission.  He is currently sitting up in the chair remains on O2 per nasal cannula but in no acute distress.  He continues to live with his 88 year old roommate.  He states that he did does not always get to the store to buy low-sodium foods due to problems with ambulation.  He also admits to not following with his fluid restriction of 64 ounces.  He states that he knows that he is taking in more than that.  He does not weigh himself on a daily basis as he states his scale does not go that high.  He states that he had not been taking his diuretic, due to the fact pharmacist said that it was too early to know, and he also states that he had tried to get hold of his primary doctor but was not able to to complete those calls.  In the past he has been noncompliant with his follow-up in the outpatient heart failure clinic, he is at least missed 3 appointments.   He has been scheduled for March 28 at 11:30 AM.  He has an 18% no-show which is 5 out of 28 appointments.  He was given teaching booklet ane weight chart.  Will continue to follow.  Pricilla Riffle RN CHFN

## 2022-11-11 NOTE — Inpatient Diabetes Management (Signed)
Inpatient Diabetes Program Recommendations  AACE/ADA: New Consensus Statement on Inpatient Glycemic Control (2015)  Target Ranges:  Prepandial:   less than 140 mg/dL      Peak postprandial:   less than 180 mg/dL (1-2 hours)      Critically ill patients:  140 - 180 mg/dL   Lab Results  Component Value Date   GLUCAP 218 (H) 11/11/2022   HGBA1C 10.2 (H) 01/27/2022    Latest Reference Range & Units Most Recent 11/10/22 14:51 11/10/22 20:32 11/10/22 20:59 11/10/22 21:18 11/10/22 22:37 11/11/22 08:11  Glucose-Capillary 70 - 99 mg/dL 218 (H) 11/11/22 08:11 155 (H) 60 (L) 63 (L) 107 (H) 105 (H) 218 (H)  (H): Data is abnormally high (L): Data is abnormally low  Diabetes history: DM2 Outpatient Diabetes medications: Levemir 30 units bid Humalog 16 units tid meal coverage  Current orders for Inpatient glycemic control:  Levemir 30 qd Novolog 16 units tid meal coverage Novolog 0-20 units tid, 0-5 units hs correction  Inpatient Diabetes Program Recommendations:   Patient had hypoglycemia post meal coverage + correction Please consider: -Decrease Novolog correction to 0-9 units tid, 0-5 units hs -Decrease Novolog meal coverage to 10 units tid if eats 50%  Thank you, Bethena Roys E. Latrel Szymczak, RN, MSN, CDE  Diabetes Coordinator Inpatient Glycemic Control Team Team Pager [phone] (8am-5pm) 11/11/2022 10:29 AM

## 2022-11-11 NOTE — Progress Notes (Signed)
Physical Therapy Treatment Patient Details Name: Cody Alexander MRN: [id number] DOB: 03-06-1956 Today's Date: 11/11/2022   History of Present Illness Cody Alexander is a 67 y.o. male with medical history significant of Combined CHF with recovered EF (EF 65% G1 DD 07/22/2022), COPD, CAD s/p stent x1, HTN, T2DM , class III obesity, OSA, recently hospitalized from 2/25 to 2/29 at [facility] for CHF exacerbation after presenting with a 20 pound weight gain believe related to diuretic nonadherence who presents to the ED with shortness of breath, lower extremity edema and piercing chest pain.  Patient states that when he left the hospital he was given a 1 week supply of Lasix to replace his Bumex but then ran out and had trouble getting a refill and as such has been out of diuretics for the past 2 weeks.  He denies cough fever or chills.  He complains of severe lower extremity pain which started 2 months ago but over the past few days has been worse.    PT Comments    Pt received upright in bed agreeable to PT services. Pt very talkative and impulsive relying on frequent VC's and redirection to maintain task at hand. He is able to transfer at supervision level resting on his baseline 4 L/min. Pt satting at 85% relying on max VC's and PT demo on appropriate PLB. Pt very talkative which may limit pt's ability to breath adequately to maintain sats > 90%. With pt focusing on his breathing he is able to maintain > 90%. Pt impulsive with x2 STS from minguard to supervision reliant on max VC's for safety awareness (I.e. safe use of UE's on RW, staying close to seated surfaces in setting of BLE buckling". Pt able to forwards and backwards step using RW with buckling of LE's relying on seated rest. Upin second STS pt able to improve buckling of LE's with prolonged standing but when pt begins to transfer to recliner pt's LE's begin buckling again lending to need seated rest break in recliner with poor eccentric control  relying on minguard to assist. Pt reports not wanting to d/c to SNF but pt educated on poor safety awareness and inability to safely ambulate in combination with frequent falls at home and lack of support that this would lead to falls and high risk for injury and/or readmission. Pt understanding with all needs in reach and RN at bedside. Continue to rec additional PT services at discharge to return to PLOF prior to transition to home setting.     Recommendations for follow up therapy are one component of a multi-disciplinary discharge planning process, led by the attending physician.  Recommendations may be updated based on patient status, additional functional criteria and insurance authorization.  Follow Up Recommendations  Skilled nursing-short term rehab (<3 hours/day) Can patient physically be transported by private vehicle: No   Assistance Recommended at Discharge Frequent or constant Supervision/Assistance  Patient can return home with the following     Equipment Recommendations  Rolling walker (2 wheels)    Recommendations for Other Services       Precautions / Restrictions Precautions Precautions: Fall     Mobility  Bed Mobility Overal bed mobility: Needs Assistance Bed Mobility: Supine to Sit     Supine to sit: Modified independent (Device/Increase time)     General bed mobility comments: bed features Patient Response: Cooperative, Impulsive  Transfers Overall transfer level: Needs assistance Equipment used: Rolling walker (2 wheels) Transfers: Sit to/from Stand, Bed to chair/wheelchair/BSC Sit to Stand: Min guard,  Supervision   Step pivot transfers: Min guard       General transfer comment: x2 STS efforts. Initially minguard but then performs at supervision level to RW.    Ambulation/Gait     Assistive device: Rolling walker (2 wheels), None Gait Pattern/deviations: Step-through pattern, Decreased step length - right, Decreased step length - left, Wide  base of support, Knees buckling       General Gait Details: Remains very jerky with mobility R knee > L knee in buckling. Relies on max VC's for safety awareness, use of RW. With prolonged standing pt's knee buckling ceases. Returns when gait/steps continues.   Stairs             Wheelchair Mobility    Modified Rankin (Stroke Patients Only)       Balance Overall balance assessment: Needs assistance Sitting-balance support: Feet supported Sitting balance-Leahy Scale: Fair     Standing balance support: Bilateral upper extremity supported, During functional activity, Reliant on assistive device for balance Standing balance-Leahy Scale: Poor Standing balance comment: reliant on UE support to safely stand                            Cognition Arousal/Alertness: Awake/alert   Overall Cognitive Status: Within Functional Limits for tasks assessed                                 General Comments: Impulsive, needs frequent redirection for task at hand to reduce falls risk due to poor safety awareness        Exercises      General Comments General comments (skin integrity, edema, etc.): 4 L/min at 85% at rest. Relies on PLB and seated rest to maintain > 90%.      Pertinent Vitals/Pain Pain Assessment Pain Assessment: Faces Faces Pain Scale: Hurts little more Pain Location: B LEs Pain Descriptors / Indicators: Discomfort, Tightness Pain Intervention(s): Monitored during session, Repositioned    Home Living                          Prior Function            PT Goals (current goals can now be found in the care plan section) Acute Rehab PT Goals Patient Stated Goal: be able to walk PT Goal Formulation: With patient Time For Goal Achievement: 11/24/22 Potential to Achieve Goals: Fair Progress towards PT goals: Progressing toward goals    Frequency    Min 2X/week      PT Plan Current plan remains appropriate     Co-evaluation              AM-PAC PT "6 Clicks" Mobility   Outcome Measure  Help needed turning from your back to your side while in a flat bed without using bedrails?: A Little Help needed moving from lying on your back to sitting on the side of a flat bed without using bedrails?: A Little Help needed moving to and from a bed to a chair (including a wheelchair)?: A Lot Help needed standing up from a chair using your arms (e.g., wheelchair or bedside chair)?: A Little Help needed to walk in hospital room?: A Lot Help needed climbing 3-5 steps with a railing? : Total 6 Click Score: 14    End of Session Equipment Utilized During Treatment: Gait belt;Oxygen Activity Tolerance: Patient tolerated  treatment well Patient left: in chair;with call bell/phone within reach;with chair alarm set;with nursing/sitter in room Nurse Communication: Mobility status PT Visit Diagnosis: Other abnormalities of gait and mobility (R26.89);Difficulty in walking, not elsewhere classified (R26.2);Muscle weakness (generalized) (M62.81);Unsteadiness on feet (R26.81);History of falling (Z91.81);Pain Pain - part of body: Leg     Time: [id number] PT Time Calculation (min) (ACUTE ONLY): 16 min  Charges:  $Gait Training: 8-22 mins                    Salem Caster. Fairly IV, PT, DPT Physical Therapist- [facility]  11/11/2022, 10:45 AM

## 2022-11-11 NOTE — Progress Notes (Signed)
Progress Note   Patient: Cody Alexander [id number] DOB: 1955/09/02 DOA: 11/09/2022     2 DOS: the patient was seen and examined on 11/11/2022    Subjective:  Patient seen and examined this morning Patient still complaining of back pain requesting for his pain medication to be changed to oxycodone which helps him a lot whole lot better. Denies any diaphoresis, nausea vomiting abdominal pain or worsening shortness of breath     Brief hospital course:   Cody Alexander is a 67 y.o. 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  Patient states that when he left the hospital he was given a 1 week supply of Lasix to replace his Bumex but then ran out and had trouble getting a refill and as such has been out of diuretics for the past 2 weeks.  He denies cough fever or chills.  He complains of severe lower extremity pain which started 2 months ago but over the past few days has been worse. ED course and data review: BP 173/108 with mild tachypnea of 22 and otherwise normal vitals.  Troponin 100>104.   EKG, independently interpreted NSR at 85 with no acute ST-T wave changes.  CTA chest showed no PE     Assessment and Plan: Acute on chronic diastolic CHF (congestive heart failure) (HCC) likely secondary to medication nonadherence Review of recent discharge summary from [facility] reveals that patient has had several hospitalizations for CHF for similar reasons EF 65% G1 DD 07/22/2022 - Continue IV Lasix - Continue spironolactone, metoprolol, isosorbide - Daily weights with intake and output monitoring - Counseled on importance of adherence  with medication and diet Cardiologist consulted we appreciate input   CAD (coronary artery disease) Atypical chest pain with elevated troponin Troponin 100>104>113> 99 and patient reports chest pain feeling like needles piercing his chest Suspect troponin elevation is related to demand ischemia CTA chest ruled out PE as etiology for his chest pain Patient had a similar pain when hospitalized at [facility] in February and was referred for outpatient stress test but did not get it - Continue metoprolol, isosorbide, atorvastatin - Nitroglycerin as needed chest pain - Echocardiogram done but results pending - Cardiology consulted appreciate input   Bilateral leg pain Suspect secondary to a combination of diabetic neuropathy and swelling from CHF Patient was given Lyrica 50 mg twice daily when hospitalized in February Patient says oxycodone helped with his leg pain we will continue this Lower extremity venous Doppler showed no DVT CTA chest negative for PE - Continue Lyrica   Essential hypertension Continue metoprolol and lisinopril   Chronic obstructive pulmonary disease (COPD) (HCC) Chronic respiratory failure with hypoxia Continue home inhalers DuoNebs as needed Continue supplemental oxygen To complete 5 days of prednisone 40 mg daily   Type 2 diabetes mellitus with hyperglycemia, with long-term current use of insulin (HCC) Diabetic neuropathy Blood sugar 227.  A1c in February was over 10 Continue basal insulin Sliding scale insulin coverage Lyrica 50 mg twice daily   OSA (obstructive sleep apnea) CPAP nightly   Obesity, Class III, BMI 40-49.9 (morbid obesity) ([place]) Complicating factor to overall prognosis and care  Advance Care Planning:   Code Status: Full code   Consults: cardiology   Family Communication: No family at bedside     Data Reviewed: [name] results reviewed by me showing hypochloremic hyponatremia: Uremic hyponatremia     Disposition: Status is:  Inpatient Remains inpatient appropriate because: Still requiring IV medications      Planned Discharge Destination: PT has recommended discharge to skilled nursing facility     Physical Exam Vitals and nursing note reviewed.  Constitutional:      General: He is not in acute distress. HENT:     Head: Normocephalic and atraumatic.  Cardiovascular:     Rate and Rhythm: Normal rate and regular rhythm.     Heart sounds: Normal heart sounds.  Pulmonary:     Effort: Pulmonary effort is normal.     Breath sounds: Wheezing present but better.  Abdominal:     Palpations: Abdomen is soft.     Tenderness: There is no abdominal tenderness.  Musculoskeletal:  Lateral lower extremity pitting edema Neurological:     Mental Status: Mental status is at baseline.       Vitals:   11/11/22 0431 11/11/22 0814 11/11/22 1201 11/11/22 1614  BP:  114/76 96/70 103/73  Pulse:  65 70 77  Resp:  16 20 20   Temp:  97.9 F (36.6 C) (!) 97.4 F (36.3 C) 97.8 F (36.6 C)  TempSrc:  Oral Oral Oral  SpO2:  95% 94% 91%  Weight: (!) 145.7 kg     Height:        Author: Verline Lema, MD 11/11/2022 4:47 PM  For on call review [email or web address].

## 2022-11-12 DIAGNOSIS — I5033 Acute on chronic diastolic (congestive) heart failure: Secondary | ICD-10-CM

## 2022-11-12 LAB — CBC WITH DIFFERENTIAL/PLATELET
Abs Immature Granulocytes: 0.05 10*3/uL (ref 0.00–0.07)
Basophils Absolute: 0 10*3/uL (ref 0.0–0.1)
Basophils Relative: 0 %
Eosinophils Absolute: 0 10*3/uL (ref 0.0–0.5)
Eosinophils Relative: 0 %
HCT: 41.2 % (ref 39.0–52.0)
Hemoglobin: 12.7 g/dL — ABNORMAL LOW (ref 13.0–17.0)
Immature Granulocytes: 1 %
Lymphocytes Relative: 6 %
Lymphs Abs: 0.5 10*3/uL — ABNORMAL LOW (ref 0.7–4.0)
MCH: 28.6 pg (ref 26.0–34.0)
MCHC: 30.8 g/dL (ref 30.0–36.0)
MCV: 92.8 fL (ref 80.0–100.0)
Monocytes Absolute: 0.2 10*3/uL (ref 0.1–1.0)
Monocytes Relative: 2 %
Neutro Abs: 8.2 10*3/uL — ABNORMAL HIGH (ref 1.7–7.7)
Neutrophils Relative %: 91 %
Platelets: 229 10*3/uL (ref 150–400)
RBC: 4.44 MIL/uL (ref 4.22–5.81)
RDW: 13.1 % (ref 11.5–15.5)
WBC: 9 10*3/uL (ref 4.0–10.5)
nRBC: 0 % (ref 0.0–0.2)

## 2022-11-12 LAB — GLUCOSE, CAPILLARY
Glucose-Capillary: 304 mg/dL — ABNORMAL HIGH (ref 70–99)
Glucose-Capillary: 281 mg/dL — ABNORMAL HIGH (ref 70–99)
Glucose-Capillary: 290 mg/dL — ABNORMAL HIGH (ref 70–99)
Glucose-Capillary: 198 mg/dL — ABNORMAL HIGH (ref 70–99)

## 2022-11-12 LAB — BASIC METABOLIC PANEL
Anion gap: 9 (ref 5–15)
BUN: 19 mg/dL (ref 8–23)
CO2: 38 mmol/L — ABNORMAL HIGH (ref 22–32)
Calcium: 8.7 mg/dL — ABNORMAL LOW (ref 8.9–10.3)
Chloride: 87 mmol/L — ABNORMAL LOW (ref 98–111)
Creatinine, Ser: 1 mg/dL (ref 0.61–1.24)
GFR, Estimated: >60 mL/min (ref >60)
Glucose, Bld: 322 mg/dL — ABNORMAL HIGH (ref 70–99)
Potassium: 4.3 mmol/L (ref 3.5–5.1)
Sodium: 134 mmol/L — ABNORMAL LOW (ref 135–145)

## 2022-11-12 MED ORDER — TERBINAFINE HCL 1 % EX CREA
TOPICAL_CREAM | Freq: Two times a day (BID) | CUTANEOUS | Status: DC
  Filled 2022-11-12: qty 12

## 2022-11-12 NOTE — Progress Notes (Signed)
Physical Therapy Treatment Patient Details Name: Cody Alexander MRN: [id number] DOB: 08-03-1956 Today's Date: 11/12/2022   History of Present Illness Cody Alexander is a 67 y.o. male with medical history significant of Combined CHF with recovered EF (EF 65% G1 DD 07/22/2022), COPD, CAD s/p stent x1, HTN, T2DM , class III obesity, OSA, recently hospitalized from 2/25 to 2/29 at [facility] for CHF exacerbation after presenting with a 20 pound weight gain believe related to diuretic nonadherence who presents to the ED with shortness of breath, lower extremity edema and piercing chest pain.  Patient states that when he left the hospital he was given a 1 week supply of Lasix to replace his Bumex but then ran out and had trouble getting a refill and as such has been out of diuretics for the past 2 weeks.  He denies cough fever or chills.  He complains of severe lower extremity pain which started 2 months ago but over the past few days has been worse.    PT Comments    Pt sleeping on arrival but quick to wake and eager to show that he is feeling better and do a prolonged walk.  Pt was able to rise to standing from recliner 3X w/o assist or AD and though he had some mild impulsivity he was able to confidently maintain balance.  Self selected to do small bouts of ambulation in room on room air, SpO2 did drop to low 80s.  Pt was able to circumambulate the nurses' station on 4L O2 with good confidence though his swollen LEs did cause him to assume wide BOS and with consistent low grade unsteadiness that he purports is near baseline. Pt feeling confident about his ability to manage at home once his LE swelling improves.  Continue with PT POC.    Recommendations for follow up therapy are one component of a multi-disciplinary discharge planning process, led by the attending physician.  Recommendations may be updated based on patient status, additional functional criteria and insurance authorization.  Follow Up  Recommendations  Home health PT Can patient physically be transported by private vehicle: Yes   Assistance Recommended at Discharge Intermittent Supervision/Assistance  Patient can return home with the following A little help with bathing/dressing/bathroom;Assistance with cooking/housework;Assist for transportation;Help with stairs or ramp for entrance   Equipment Recommendations  Rolling walker (2 wheels)    Recommendations for Other Services       Precautions / Restrictions Precautions Precautions: Fall Restrictions Weight Bearing Restrictions: No     Mobility  Bed Mobility               General bed mobility comments: in recliner pre/post session    Transfers Overall transfer level: Needs assistance Equipment used: Rolling walker (2 wheels) Transfers: Sit to/from Stand Sit to Stand: Supervision           General transfer comment: Pt showed good confidence, needed UEs but able to rise w/o assist or AD.  STS 3x t/o session w/o hesitation    Ambulation/Gait Ambulation/Gait assistance: Supervision Gait Distance (Feet): 200 Feet Assistive device: None (PT managing O2)         General Gait Details: Still displaying some impulsivity with mild unsteadiness but overall greatly improved safety as compared to prior attempts with PT.  Pt did not have any LOBs but still having tighness with LE swelling with guarded mechanics.  Pt on 4L during ambulation with SpO2 dropping to mid 80s, quickly back to 90s at rest   Stairs  Wheelchair Mobility    Modified Rankin (Stroke Patients Only)       Balance Overall balance assessment: Needs assistance Sitting-balance support: Feet supported Sitting balance-Leahy Scale: Good       Standing balance-Leahy Scale: Fair Standing balance comment: able to stand and perform prolonged bout of ambulation w/o AD, mild unsteadiness/impulsivity with no overt LOBs                            Cognition  Arousal/Alertness: Awake/alert Behavior During Therapy: Impulsive, WFL for tasks assessed/performed Overall Cognitive Status: Within Functional Limits for tasks assessed                                          Exercises      General Comments General comments (skin integrity, edema, etc.): Able to do multiple small bouts of ambulation w/o O2 (per his request) - SpO2 dropped relatively quickly to mid/low 80s      Pertinent Vitals/Pain Pain Assessment Pain Assessment: 0-10 Pain Score: 4  Pain Location: chronic LBP, B LE swelling Pain Descriptors / Indicators: Discomfort, Tightness    Home Living                          Prior Function            PT Goals (current goals can now be found in the care plan section) Progress towards PT goals: Progressing toward goals    Frequency    Min 2X/week      PT Plan Current plan remains appropriate    Co-evaluation              AM-PAC PT "6 Clicks" Mobility   Outcome Measure  Help needed turning from your back to your side while in a flat bed without using bedrails?: A Little Help needed moving from lying on your back to sitting on the side of a flat bed without using bedrails?: A Little Help needed moving to and from a bed to a chair (including a wheelchair)?: A Little Help needed standing up from a chair using your arms (e.g., wheelchair or bedside chair)?: None Help needed to walk in hospital room?: A Little Help needed climbing 3-5 steps with a railing? : A Little 6 Click Score: 19    End of Session Equipment Utilized During Treatment: Gait belt;Oxygen Activity Tolerance: Patient tolerated treatment well Patient left: in chair;with call bell/phone within reach;with chair alarm set;with nursing/sitter in room Nurse Communication: Mobility status PT Visit Diagnosis: Other abnormalities of gait and mobility (R26.89);Difficulty in walking, not elsewhere classified (R26.2);Muscle weakness  (generalized) (M62.81);Unsteadiness on feet (R26.81);History of falling (Z91.81);Pain Pain - Right/Left: Right Pain - part of body: Leg     Time: 1515-1540 PT Time Calculation (min) (ACUTE ONLY): 25 min  Charges:  $Gait Training: 8-22 mins $Therapeutic Activity: 8-22 mins                     Kreg Shropshire, DPT 11/12/2022, 4:58 PM

## 2022-11-12 NOTE — Progress Notes (Signed)
   Heart Failure Nurse Navigator Note  Met with patient today, he was just ambulating to his bed from the bathroom.  Gait appears steady.  Attempted Reds Vest reading - x2 with results of "low quality."  Discussed with patient his not showing for follow up heart failure clinic appointments, he states the one time he did show up he was told he did not have an appointment, when I reviewed his chart - it was documented he" no showed" for at least 5 appointments.  Will continue to follow.  Pricilla Riffle RN [facility]

## 2022-11-12 NOTE — Progress Notes (Signed)
Progress Note   Patient: Cody Alexander [id number] DOB: 1955/12/29 DOA: 11/09/2022     3 DOS: the patient was seen and examined on 11/12/2022     Subjective:  Patient seen and examined this morning He tells me his back pain significantly improved when medication was changed to oxycodone  He feels more motivated to work with physical therapist and looking forward to going home rather than going to SNF. Denies any diaphoresis, nausea vomiting abdominal pain or worsening shortness of breath     Brief hospital course:   Cody Alexander is a 67 y.o. male with medical history significant of Combined CHF with recovered EF (EF 65% G1 DD 07/22/2022), COPD, CAD s/p stent x1, HTN, T2DM , class III obesity, OSA, recently hospitalized from 2/25 to 2/29 at [facility] for CHF exacerbation after presenting with a 20 pound weight gain believe related to diuretic nonadherence who presents to the ED with shortness of breath, lower extremity edema and piercing chest pain.  Patient states that when he left the hospital he was given a 1 week supply of Lasix to replace his Bumex but then ran out and had trouble getting a refill and as such has been out of diuretics for the past 2 weeks.  He denies cough fever or chills.  He complains of severe lower extremity pain which started 2 months ago but over the past few days has been worse. ED course and data review: BP 173/108 with mild tachypnea of 22 and otherwise normal vitals.  Troponin 100>104.   EKG, independently interpreted NSR at 85 with no acute ST-T wave changes.  CTA chest showed no PE Patient currently undergoing IV diuresis with significant improvement PT have recommended skilled nursing facility placement however patient would like to be discharged home when medically ready     Assessment and Plan: Acute on chronic diastolic CHF (congestive heart failure) ([place]) likely secondary to medication nonadherence Review of recent discharge summary from [facility]  reveals that patient has had several hospitalizations for CHF for similar reasons EF 65% G1 DD 07/22/2022 - Continue IV Lasix --Patient is diuresing much better and has significant improvement in lower extremity swelling as well as his respiratory function - Continue spironolactone, metoprolol, isosorbide - Daily weights with intake and output monitoring - Counseled on importance of adherence with medication and diet Cardiologist consulted we appreciate input   CAD (coronary artery disease) Atypical chest pain with elevated troponin Troponin 100>104>113> 99 and patient reports chest pain feeling like needles piercing his chest Suspect troponin elevation is related to demand ischemia CTA chest ruled out PE as etiology for his chest pain Patient had a similar pain when hospitalized at [facility] in February and was referred for outpatient stress test but did not get it - Continue metoprolol, isosorbide, atorvastatin - Nitroglycerin as needed chest pain - Echocardiogram done but results pending - Cardiology consulted appreciate input -I have Reached out to cardiology team today and they will be leaving a note   Bilateral leg pain Suspect secondary to a combination of diabetic neuropathy and swelling from CHF Patient was given Lyrica 50 mg twice daily when hospitalized in February Patient says oxycodone helped with his leg pain we will continue this Lower extremity venous Doppler showed no DVT CTA chest negative for PE - Continue Lyrica   Essential hypertension Continue metoprolol and lisinopril   Chronic obstructive pulmonary disease (COPD) ([place]) Chronic respiratory failure with hypoxia Continue home inhalers DuoNebs as needed Continue supplemental oxygen To complete 5 days of  prednisone 40 mg daily   Type 2 diabetes mellitus with hyperglycemia, with long-term current use of insulin (HCC) Diabetic neuropathy Blood sugar 227.  A1c in February was over 10 Continue basal insulin Sliding  scale insulin coverage Lyrica 50 mg twice daily   OSA (obstructive sleep apnea) CPAP nightly   Obesity, Class III, BMI 40-49.9 (morbid obesity) (HCC) Complicating factor to overall prognosis and care      Advance Care Planning:   Code Status: Full code   Consults: cardiology   Family Communication: No family at bedside     Data Reviewed: Cody Alexander results reviewed by me showing hypochloremic hyponatremia: Uremic hyponatremia     Disposition: Status is: Inpatient Remains inpatient appropriate because: Still requiring IV medications       Planned Discharge Destination: PT has recommended discharge to skilled nursing facility     Physical Exam Vitals and nursing note reviewed.  Constitutional:      General: He is not in acute distress. HENT:     Head: Normocephalic and atraumatic.  Cardiovascular:     Rate and Rhythm: Normal rate and regular rhythm.     Heart sounds: Normal heart sounds.  Pulmonary:     Effort: Pulmonary effort is normal.     Breath sounds: Wheezing present but better.  Abdominal:     Palpations: Abdomen is soft.     Tenderness: There is no abdominal tenderness.  Musculoskeletal:  Lateral lower extremity pitting edema Neurological:     Mental Status: Mental status is at baseline.        Vitals:   11/12/22 0220 11/12/22 0349 11/12/22 0753 11/12/22 1231  BP: 112/86 127/89 (!) 148/95 124/86  Pulse: 73 67 69 73  Resp: 18 18 20 20   Temp: 98.2 F (36.8 C) 98.5 F (36.9 C) (!) 97.5 F (36.4 C) 97.7 F (36.5 C)  TempSrc: Oral     SpO2: 91% 96% 95% 90%  Weight:      Height:        Author: Verline Lema, MD 11/12/2022 12:49 PM  For on call review [email or web address].

## 2022-11-12 NOTE — Progress Notes (Signed)
ReDS Vest / Clip - 11/12/22 1000       ReDS Vest / Clip   Station Marker D    Ruler Value 43    Anatomical Comments low quality

## 2022-11-12 NOTE — Consult Note (Signed)
Cody Alexander is a 67 y.o. male  [id number]  Primary Cardiologist: [facility] Reason for Consultation: CHF exacerbation  HPI: Patient is a 67 y.o. male with a history of diabetes, hypertension, COPD, CHF, morbid obesity who presented to the ED on 11/09/22 complaining of of chest pain described as sharp, worse with breathing, associated with shortness of breath.  Also reports worsening bilateral leg swelling, dyspnea on exertion, orthopnea over the past 1 to 2 weeks.  This is in the setting of running out of his diuretic Bumex a few weeks ago.  No syncope or trauma, no fever.   Patient has been admitted several times in the last 3 months for similar presentations, [facility]. Has significant social barriers (transportation, finances) which limits his adherence and follow up.   Cardiology is consulted for assistance with patient's care due to CHF exacerbation.   Review of Systems: negative unless otherwise stated in HPI.    Past Medical History:  Diagnosis Date   CHF (congestive heart failure) (HCC)    COPD (chronic obstructive pulmonary disease) (HCC)    Diabetes mellitus without complication ([place])    Hypertension    Neuropathy     Medications Prior to Admission  Medication Sig Dispense Refill   acetaminophen (TYLENOL) 500 MG tablet Take 1,000 mg by mouth every 6 (six) hours as needed for mild pain or moderate pain.     albuterol (PROVENTIL) (2.5 MG/3ML) 0.083% nebulizer solution Take 2.5 mg by nebulization every 4 (four) hours as needed.     diclofenac Sodium (VOLTAREN) 1 % GEL Apply 2 g topically in the morning and at bedtime.     hydrOXYzine (ATARAX) 10 MG tablet Take 1 tablet (10 mg total) by mouth 3 (three) times daily as needed. Home med. 30 tablet 0   ipratropium-albuterol (DUONEB) 0.5-2.5 (3) MG/3ML SOLN Take 3 mLs by nebulization every 6 (six) hours as needed. 120 mL 1   KLOR-CON M10 10 MEQ tablet Take 10 mEq by mouth daily.     LANTUS SOLOSTAR 100 UNIT/ML  Solostar Pen Inject 40 Units into the skin daily.     magnesium oxide (MAG-OX) 400 MG tablet Take 1 tablet by mouth 2 (two) times daily.     metoprolol succinate (TOPROL-XL) 25 MG 24 hr tablet Take 1 tablet by mouth daily.     pregabalin (LYRICA) 25 MG capsule Take 25 mg by mouth 2 (two) times daily.     Selenium Sulfide 2.25 % SHAM SMARTSIG:Liberally Topical Daily     terbinafine (LAMISIL) 250 MG tablet Take 250 mg by mouth daily.     traMADol (ULTRAM) 50 MG tablet Take 50 mg by mouth 2 (two) times daily as needed.     triamcinolone cream (KENALOG) 0.1 % Apply 1 Application topically 2 (two) times daily.     atorvastatin (LIPITOR) 40 MG tablet Take 40 mg by mouth daily.     budesonide-formoterol (SYMBICORT) 160-4.5 MCG/ACT inhaler Inhale 2 puffs into the lungs 2 (two) times daily.     bumetanide (BUMEX) 1 MG tablet Take 3 mg by mouth daily.     furosemide (LASIX) 40 MG tablet Take 40 mg by mouth 3 (three) times daily. (Patient not taking: Reported on 11/11/2022)     furosemide (LASIX) 80 MG tablet Take 1 tablet (80 mg total) by mouth daily. (Patient not taking: Reported on 11/11/2022)     gabapentin (NEURONTIN) 600 MG tablet Take 600 mg by mouth 3 (three) times daily.     insulin detemir (  LEVEMIR) 100 UNIT/ML FlexPen Inject 30 Units into the skin in the morning and at bedtime. 15 mL 11   insulin lispro (HUMALOG) 100 UNIT/ML KwikPen Inject 10 Units into the skin 3 (three) times daily with meals.     isosorbide dinitrate (ISORDIL) 30 MG tablet Take 30 mg by mouth every morning.     lisinopril (ZESTRIL) 10 MG tablet Take 10 mg by mouth daily.     lisinopril (ZESTRIL) 20 MG tablet Take 20 mg by mouth daily. (Patient not taking: Reported on 11/11/2022)     metFORMIN (GLUCOPHAGE) 1000 MG tablet Take 1,000 mg by mouth 2 (two) times daily.     metoprolol tartrate (LOPRESSOR) 25 MG tablet Take 25 mg by mouth 2 (two) times daily. (Patient not taking: Reported on 06/19/2022)     PROAIR HFA 108 (90 Base)  MCG/ACT inhaler Inhale 2 puffs into the lungs every 4 (four) hours as needed for wheezing. 18 g 0   spironolactone (ALDACTONE) 25 MG tablet Take 12.5 mg by mouth daily. (Patient not taking: Reported on 06/19/2022)        aspirin EC  81 mg Oral Daily   atorvastatin  40 mg Oral Daily   enoxaparin (LOVENOX) injection  0.5 mg/kg Subcutaneous Q24H   furosemide  80 mg Intravenous BID   insulin aspart  0-5 Units Subcutaneous QHS   insulin aspart  0-9 Units Subcutaneous TID WC   insulin aspart  16 Units Subcutaneous TID WC   insulin detemir  30 Units Subcutaneous Q2200   ipratropium-albuterol  3 mL Nebulization BID   isosorbide dinitrate  30 mg Oral q morning   lisinopril  20 mg Oral Daily   metoprolol tartrate  25 mg Oral BID   mometasone-formoterol  2 puff Inhalation BID   predniSONE  40 mg Oral Q breakfast   pregabalin  50 mg Oral BID   spironolactone  12.5 mg Oral Daily   terbinafine   Topical BID    Infusions:   Allergies  Allergen Reactions   Erythromycin Anaphylaxis and Swelling    Had eye swelling with erythromycin during a time when he had perf ear drum.  Tolerated azithromycin.    Social History   Socioeconomic History   Marital status: Single    Spouse name: Not on file   Number of children: Not on file   Years of education: Not on file   Highest education level: Not on file  Occupational History   Not on file  Tobacco Use   Smoking status: Former    Types: Cigarettes   Smokeless tobacco: Never  Vaping Use   Vaping Use: Never used  Substance and Sexual Activity   Alcohol use: No   Drug use: Never   Sexual activity: Not on file  Other Topics Concern   Not on file  Social History Narrative   Not on file   Social Determinants of Health   Financial Resource Strain: Not on file  Food Insecurity: No Food Insecurity (11/10/2022)   Hunger Vital Sign    Worried About Running Out of Food in the Last Year: Never true    Ran Out of Food in the Last Year: Never  true  Transportation Needs: No Transportation Needs (11/10/2022)   PRAPARE - Hydrologist (Medical): No    Lack of Transportation (Non-Medical): No  Physical Activity: Not on file  Stress: Not on file  Social Connections: Not on file  Intimate Partner Violence: Not At Risk (  11/10/2022)   Humiliation, Afraid, Rape, and Kick questionnaire    Fear of Current or Ex-Partner: No    Emotionally Abused: No    Physically Abused: No    Sexually Abused: No    Family History  Problem Relation Age of Onset   Stroke Mother    Hypertension Father     PHYSICAL EXAM: Vitals:   11/12/22 0753 11/12/22 1231  BP: (!) 148/95 124/86  Pulse: 69 73  Resp: 20 20  Temp: (!) 97.5 F (36.4 C) 97.7 F (36.5 C)  SpO2: 95% 90%     Intake/Output Summary (Last 24 hours) at 11/12/2022 1420 Last data filed at 11/12/2022 1245 Gross per 24 hour  Intake 480 ml  Output 1950 ml  Net -1470 ml    General:  Well appearing. No respiratory difficulty HEENT: normal Neck: supple. no JVD. Carotids 2+ bilat; no bruits. No lymphadenopathy or thryomegaly appreciated. Cor: PMI nondisplaced. Regular rate & rhythm. No rubs, gallops or murmurs. Lungs: clear Abdomen: soft, nontender, nondistended. No hepatosplenomegaly. No bruits or masses. Good bowel sounds. Extremities: no cyanosis, clubbing, rash, edema Neuro: alert & oriented x 3, cranial nerves grossly intact. moves all 4 extremities w/o difficulty. Affect pleasant.  ECG: normal sinus rhythm, HR 85 bpm, no acute changes  Results for orders placed or performed during the hospital encounter of 11/09/22 (from the past 24 hour(s))  Glucose, capillary     Status: None   Collection Time: 11/11/22  4:10 PM  Result Value Ref Range   Glucose-Capillary 76 70 - 99 mg/dL  Glucose, capillary     Status: Abnormal   Collection Time: 11/11/22  8:51 PM  Result Value Ref Range   Glucose-Capillary 180 (H) 70 - 99 mg/dL  CBC with Differential/Platelet      Status: Abnormal   Collection Time: 11/12/22  5:40 AM  Result Value Ref Range   WBC 9.0 4.0 - 10.5 K/uL   RBC 4.44 4.22 - 5.81 MIL/uL   Hemoglobin 12.7 (L) 13.0 - 17.0 g/dL   HCT 41.2 39.0 - 52.0 %   MCV 92.8 80.0 - 100.0 fL   MCH 28.6 26.0 - 34.0 pg   MCHC 30.8 30.0 - 36.0 g/dL   RDW 13.1 11.5 - 15.5 %   Platelets 229 150 - 400 K/uL   nRBC 0.0 0.0 - 0.2 %   Neutrophils Relative % 91 %   Neutro Abs 8.2 (H) 1.7 - 7.7 K/uL   Lymphocytes Relative 6 %   Lymphs Abs 0.5 (L) 0.7 - 4.0 K/uL   Monocytes Relative 2 %   Monocytes Absolute 0.2 0.1 - 1.0 K/uL   Eosinophils Relative 0 %   Eosinophils Absolute 0.0 0.0 - 0.5 K/uL   Basophils Relative 0 %   Basophils Absolute 0.0 0.0 - 0.1 K/uL   Immature Granulocytes 1 %   Abs Immature Granulocytes 0.05 0.00 - 0.07 K/uL  Basic metabolic panel     Status: Abnormal   Collection Time: 11/12/22  5:40 AM  Result Value Ref Range   Sodium 134 (L) 135 - 145 mmol/L   Potassium 4.3 3.5 - 5.1 mmol/L   Chloride 87 (L) 98 - 111 mmol/L   CO2 38 (H) 22 - 32 mmol/L   Glucose, Bld 322 (H) 70 - 99 mg/dL   BUN 19 8 - 23 mg/dL   Creatinine, Ser 1.00 0.61 - 1.24 mg/dL   Calcium 8.7 (L) 8.9 - 10.3 mg/dL   GFR, Estimated >60 >60 mL/min  Anion gap 9 5 - 15  Glucose, capillary     Status: Abnormal   Collection Time: 11/12/22  7:52 AM  Result Value Ref Range   Glucose-Capillary 304 (H) 70 - 99 mg/dL  Glucose, capillary     Status: Abnormal   Collection Time: 11/12/22 12:32 PM  Result Value Ref Range   Glucose-Capillary 281 (H) 70 - 99 mg/dL   ECHOCARDIOGRAM COMPLETE  Result Date: 11/11/2022    ECHOCARDIOGRAM REPORT   Patient Name:   Cody Alexander Date of Exam: 11/11/2022 Medical Rec #:  [id number]         Height:       73.0 in Accession #:    [id number]        Weight:       321.2 lb Date of Birth:  12-21-1955         BSA:          2.632 m Patient Age:    9 years          BP:           96/70 mmHg Patient Gender: M                 HR:           66 bpm.  Exam Location:  [facility] Procedure: 2D Echo, Cardiac Doppler and Color Doppler Indications:     Chest Pain  History:         Patient has prior history of Echocardiogram examinations, most                  recent 06/20/2022. CHF, CAD and Previous Myocardial Infarction,                  COPD, Signs/Symptoms:Chest Pain and Syncope; Risk                  Factors:Hypertension, Diabetes, Dyslipidemia and Sleep Apnea.  Sonographer:     Wenda Low Referring Phys:  [id number] Athena Masse Diagnosing Phys: Kathlyn Sacramento MD  Sonographer Comments: Technically difficult study due to poor echo windows and patient is obese. Image acquisition challenging due to COPD. IMPRESSIONS  1. Left ventricular ejection fraction, by estimation, is 65 to 70%. The left ventricle has normal function. The left ventricle has no regional wall motion abnormalities. There is moderate concentric left ventricular hypertrophy. Left ventricular diastolic parameters are consistent with Grade I diastolic dysfunction (impaired relaxation).  2. Right ventricular systolic function is normal. The right ventricular size is normal. Tricuspid regurgitation signal is inadequate for assessing PA pressure.  3. Left atrial size was mild to moderately dilated.  4. Right atrial size was mildly dilated.  5. The mitral valve is normal in structure. No evidence of mitral valve regurgitation. No evidence of mitral stenosis.  6. The aortic valve is normal in structure. Aortic valve regurgitation is not visualized. No aortic stenosis is present.  7. Aortic dilatation noted. There is mild dilatation of the aortic root, measuring 41 mm.  8. The inferior vena cava is normal in size with greater than 50% respiratory variability, suggesting right atrial pressure of 3 mmHg. FINDINGS  Left Ventricle: Left ventricular ejection fraction, by estimation, is 65 to 70%. The left ventricle has normal function. The left ventricle has no regional wall motion abnormalities. The left  ventricular internal cavity size was normal in size. There is  moderate concentric left ventricular hypertrophy. Left ventricular diastolic parameters are consistent with Grade I diastolic dysfunction (impaired  relaxation). Right Ventricle: The right ventricular size is normal. No increase in right ventricular wall thickness. Right ventricular systolic function is normal. Tricuspid regurgitation signal is inadequate for assessing PA pressure. The tricuspid regurgitant velocity is 1.85 m/s, and with an assumed right atrial pressure of 3 mmHg, the estimated right ventricular systolic pressure is 123456 mmHg. Left Atrium: Left atrial size was mild to moderately dilated. Right Atrium: Right atrial size was mildly dilated. Pericardium: There is no evidence of pericardial effusion. Mitral Valve: The mitral valve is normal in structure. No evidence of mitral valve regurgitation. No evidence of mitral valve stenosis. MV peak gradient, 3.6 mmHg. The mean mitral valve gradient is 1.0 mmHg. Tricuspid Valve: The tricuspid valve is normal in structure. Tricuspid valve regurgitation is not demonstrated. No evidence of tricuspid stenosis. Aortic Valve: The aortic valve is normal in structure. Aortic valve regurgitation is not visualized. No aortic stenosis is present. Aortic valve mean gradient measures 6.0 mmHg. Aortic valve peak gradient measures 11.0 mmHg. Aortic valve area, by VTI measures 2.90 cm. Pulmonic Valve: The pulmonic valve was normal in structure. Pulmonic valve regurgitation is not visualized. No evidence of pulmonic stenosis. Aorta: Aortic dilatation noted. There is mild dilatation of the aortic root, measuring 41 mm. Venous: The inferior vena cava is normal in size with greater than 50% respiratory variability, suggesting right atrial pressure of 3 mmHg. IAS/Shunts: No atrial level shunt detected by color flow Doppler.  LEFT VENTRICLE PLAX 2D LVIDd:         4.60 cm   Diastology LVIDs:         2.70 cm   LV e'  medial:    7.40 cm/s LV PW:         1.80 cm   LV E/e' medial:  9.4 LV IVS:        2.10 cm   LV e' lateral:   7.83 cm/s LVOT diam:     2.10 cm   LV E/e' lateral: 8.8 LV SV:         113 LV SV Index:   43 LVOT Area:     3.46 cm  RIGHT VENTRICLE RV Basal diam:  4.10 cm RV Mid diam:    3.20 cm RV S prime:     17.20 cm/s TAPSE (M-mode): 2.5 cm LEFT ATRIUM             Index        RIGHT ATRIUM           Index LA diam:        4.10 cm 1.56 cm/m   RA Area:     22.90 cm LA Vol (A2C):   95.3 ml 36.21 ml/m  RA Volume:   65.70 ml  24.97 ml/m LA Vol (A4C):   92.8 ml 35.26 ml/m LA Biplane Vol: 94.1 ml 35.76 ml/m  AORTIC VALVE AV Area (Vmax):    3.03 cm AV Area (Vmean):   3.17 cm AV Area (VTI):     2.90 cm AV Vmax:           166.00 cm/s AV Vmean:          116.000 cm/s AV VTI:            0.388 m AV Peak Grad:      11.0 mmHg AV Mean Grad:      6.0 mmHg LVOT Vmax:         145.00 cm/s LVOT Vmean:        106.000 cm/s  LVOT VTI:          0.325 m LVOT/AV VTI ratio: 0.84  AORTA Ao Root diam: 4.10 cm MITRAL VALVE               TRICUSPID VALVE MV Area (PHT): 2.60 cm    TR Peak grad:   13.7 mmHg MV Area VTI:   3.84 cm    TR Vmax:        185.00 cm/s MV Peak grad:  3.6 mmHg MV Mean grad:  1.0 mmHg    SHUNTS MV Vmax:       0.95 m/s    Systemic VTI:  0.32 m MV Vmean:      52.5 cm/s   Systemic Diam: 2.10 cm MV Decel Time: 292 msec MV E velocity: 69.20 cm/s MV A velocity: 91.70 cm/s MV E/A ratio:  0.75 Kathlyn Sacramento MD Electronically signed by Kathlyn Sacramento MD Signature Date/Time: 11/11/2022/5:31:55 PM    Final      ASSESSMENT AND PLAN: Patient is a 67 year old male who presented to the ED on 11/09/22 with complaints of worsening shortness of breath, lower extremity edema, and orthopnea secondary to running out of his diuretic. Echo 10/25/22 revealed normal EF, grade I DD. Troponin levels 100>104>113>99, likely from demand ischemia. Patient started on IV Lasix on 11/10/22 and has been diuresing well since then. Patient sitting in bed side  chair at time of consult. Reports shortness of breath, chest pain and lower extremity edema improved. Discussed importance of following up as an outpatient with regular cardiologist and the heart failure clinic to prevent re-hospitalization. Recommend continuing IV Lasix. No further recommendation at this time. Will continue to follow.   Engineer, drilling FNP-C
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
## 2022-11-12 NOTE — TOC Progression Note (Signed)
Transition of Care [facility]) - Progression Note    Patient Details  Name: Cody Alexander MRN: [id number] Date of Birth: [birth year]  [facility] of [facility] CM/SW Contact  Laurena Slimmer, RN Phone Number: 11/12/2022, 9:41 PM  Clinical Narrative:    Case reviewed for DME  Patient not found in room to discuss therapy recommendation and disposition.   Expected Discharge Plan: [place] Barriers to Discharge: Continued Medical Work up  Expected Discharge Plan and [place] Choice: [place] Living arrangements for the past 2 months: [place]                                       Social Determinants of Health (SDOH) Interventions SDOH Screenings   Food Insecurity: No Food Insecurity (11/10/2022)  Housing: Low Risk  (11/10/2022)  Transportation Needs: No Transportation Needs (11/10/2022)  Utilities: Not At Risk (11/10/2022)  Tobacco Use: Medium Risk (11/09/2022)    Readmission Risk Interventions    06/25/2022   10:13 AM  Readmission Risk Prevention Plan  Transportation Screening Complete  PCP or Specialist Appt within 3-5 Days Complete  HRI or [place] Complete  Social Work Consult for [place] Planning/Counseling Complete  Palliative Care Screening Not Applicable  Medication Review Press photographer) Complete

## 2022-11-13 DIAGNOSIS — I5033 Acute on chronic diastolic (congestive) heart failure: Secondary | ICD-10-CM | POA: Diagnosis not present

## 2022-11-13 LAB — CBC WITH DIFFERENTIAL/PLATELET
Abs Immature Granulocytes: 0.03 10*3/uL (ref 0.00–0.07)
Basophils Absolute: 0 10*3/uL (ref 0.0–0.1)
Basophils Relative: 0 %
Eosinophils Absolute: 0 10*3/uL (ref 0.0–0.5)
Eosinophils Relative: 0 %
HCT: 41.4 % (ref 39.0–52.0)
Hemoglobin: 12.7 g/dL — ABNORMAL LOW (ref 13.0–17.0)
Immature Granulocytes: 0 %
Lymphocytes Relative: 14 %
Lymphs Abs: 1.3 10*3/uL (ref 0.7–4.0)
MCH: 28.5 pg (ref 26.0–34.0)
MCHC: 30.7 g/dL (ref 30.0–36.0)
MCV: 92.8 fL (ref 80.0–100.0)
Monocytes Absolute: 0.9 10*3/uL (ref 0.1–1.0)
Monocytes Relative: 10 %
Neutro Abs: 7 10*3/uL (ref 1.7–7.7)
Neutrophils Relative %: 76 %
Platelets: 235 10*3/uL (ref 150–400)
RBC: 4.46 MIL/uL (ref 4.22–5.81)
RDW: 13.3 % (ref 11.5–15.5)
WBC: 9.4 10*3/uL (ref 4.0–10.5)
nRBC: 0 % (ref 0.0–0.2)

## 2022-11-13 LAB — GLUCOSE, CAPILLARY
Glucose-Capillary: 249 mg/dL — ABNORMAL HIGH (ref 70–99)
Glucose-Capillary: 187 mg/dL — ABNORMAL HIGH (ref 70–99)
Glucose-Capillary: 276 mg/dL — ABNORMAL HIGH (ref 70–99)
Glucose-Capillary: 225 mg/dL — ABNORMAL HIGH (ref 70–99)

## 2022-11-13 LAB — BASIC METABOLIC PANEL
Anion gap: 8 (ref 5–15)
BUN: 29 mg/dL — ABNORMAL HIGH (ref 8–23)
CO2: 37 mmol/L — ABNORMAL HIGH (ref 22–32)
Calcium: 8.8 mg/dL — ABNORMAL LOW (ref 8.9–10.3)
Chloride: 91 mmol/L — ABNORMAL LOW (ref 98–111)
Creatinine, Ser: 1.08 mg/dL (ref 0.61–1.24)
GFR, Estimated: >60 mL/min (ref >60)
Glucose, Bld: 306 mg/dL — ABNORMAL HIGH (ref 70–99)
Potassium: 3.8 mmol/L (ref 3.5–5.1)
Sodium: 136 mmol/L (ref 135–145)

## 2022-11-13 MED ORDER — HYDROCERIN EX CREA
TOPICAL_CREAM | Freq: Two times a day (BID) | CUTANEOUS | Status: DC
  Filled 2022-11-13: qty 113

## 2022-11-13 NOTE — Progress Notes (Signed)
PT Cancellation Note  Patient Details Name: Cody Alexander MRN: [id number] DOB: 12-22-1955   Cancelled Treatment:    Reason Eval/Treat Not Completed: Other (comment) Pt laying in bed in NAD on arrival. Pleasant and interactive but states that he has been up in the room a few times today and this afternoon was able to walk the loop 2 times earlier this afternoon and does not feel like he is up for doing much more this afternoon.  Pt requests pain meds, spoke with RN.    Kreg Shropshire, DPT 11/13/2022, 4:29 PM

## 2022-11-13 NOTE — Care Management Important Message (Signed)
Important Message  Patient Details  Name: Regionald Schuelke MRN: [id number] Date of Birth: [birth year]   Medicare Important Message Given:  Yes     Dannette Barbara 11/13/2022, 11:24 AM

## 2022-11-13 NOTE — Progress Notes (Signed)
  Progress Note   Patient: Cody Alexander [id number] DOB: 1956/07/28 DOA: 11/09/2022     4 DOS: the patient was seen and examined on 11/13/2022   Brief hospital course:  Assessment and Plan: * Acute on chronic diastolic CHF (congestive heart failure) (HCC) - IV lasix 80 mg bid  - ASA 81 mg PO daily  - Lipitor 40 mg PO daily  - Aldactone 12.5 mg PO daily   CAD (coronary artery disease) - Management as above  - Cardiology is following   Bilateral leg pain - IV morphine 2 mg q2 hr PRN  - Lyrica 50 mg PO bid  - Oxycodone 15 mg PO q6 hr PRN   Essential hypertension - Isordil 30 mg PO daily  - Lisinopril 20 mg PO daily  - Lopressor 25 mg PO bid   Chronic obstructive pulmonary disease (COPD) (HCC) Chronic respiratory failure with hypoxia - Dulera 2 puff bid  - Duoneb bid  - Prednisone 40 mg PO daily   Type 2 diabetes mellitus with hyperglycemia, with long-term current use of insulin (HCC) Diabetic neuropathy - Novolog SS ACHS  - Novolog 16 units sq tid  - Detemir 30 units sq daily   OSA (obstructive sleep apnea) - CPAP nightly  Obesity, Class III, BMI 40-49.9 (morbid obesity) ([place]) - Complicating care   DVT prophylaxis: Lovenox 72.5 mg sq daily       Subjective: Pt seen and examined at the bedside. He still has lower extremity edema and he continues on IV lasix. Appreciate cardiology following along. PT/OT following.  Physical Exam: Vitals:   11/12/22 2042 11/12/22 2335 11/13/22 0456 11/13/22 0806  BP: 128/81 127/77 127/88 115/75  Pulse: 70 70 60 61  Resp: 20 18 19 18   Temp: (!) 97.3 F (36.3 C) 97.8 F (36.6 C) 98 F (36.7 C) 97.6 F (36.4 C)  TempSrc:      SpO2: 100% 100% 99% 93%  Weight:    (!) 140.3 kg  Height:       Physical Exam Constitutional:      Appearance: He is well-developed.  HENT:     Head: Normocephalic and atraumatic.  Cardiovascular:     Rate and Rhythm: Normal rate and regular rhythm.  Pulmonary:     Effort: Pulmonary effort  is normal.  Abdominal:     Palpations: Abdomen is soft.     Comments: Obese  Musculoskeletal:     Cervical back: Normal range of motion.  Skin:    General: Skin is warm.  Neurological:     Mental Status: He is alert and oriented to person, place, and time.  Psychiatric:        Mood and Affect: Mood normal.    Data Reviewed:   Disposition: Status is: Inpatient  Planned Discharge Destination: Barriers to discharge: IV lasix     Time spent: 35 minutes  Author: Lucienne Minks , MD 11/13/2022 10:33 AM  For on call review [email or web address].

## 2022-11-13 NOTE — Progress Notes (Signed)
Occupational Therapy Treatment Patient Details Name: Cody Alexander MRN: [id number] DOB: [birth year] Today's Date: 11/13/2022   History of present illness Cody Alexander is a 67 y.o. male with medical history significant of Combined CHF with recovered EF (EF 65% G1 DD 07/22/2022), COPD, CAD s/p stent x1, HTN, T2DM , class III obesity, OSA, recently hospitalized from 2/25 to 2/29 at [facility] for CHF exacerbation after presenting with a 20 pound weight gain believe related to diuretic nonadherence who presents to the ED with shortness of breath, lower extremity edema and piercing chest pain.  Patient states that when he left the hospital he was given a 1 week supply of Lasix to replace his Bumex but then ran out and had trouble getting a refill and as such has been out of diuretics for the past 2 weeks.  He denies cough fever or chills.  He complains of severe lower extremity pain which started 2 months ago but over the past few days has been worse.   OT comments  Pt received seated EOB, on 2L O2. Appearing alert, drinking coffee; willing to work with OT on functional mobility; declines ADLs at this time; able to don BIL shoes (although intially asked OT to do it for him; OT directed him to try it himself and he was able to don BIL shoes at EOB). T/f supervision; O2 bumped to 4L, as was in 80's on 2L. See flowsheet below for further details of session. Left semi-reclined in bed with all needs in reach.     Recommendations for follow up therapy are one component of a multi-disciplinary discharge planning process, led by the attending physician.  Recommendations may be updated based on patient status, additional functional criteria and insurance authorization.    Follow Up Recommendations  Home health OT     Assistance Recommended at Discharge Set up Supervision/Assistance  Patient can return home with the following  A little help with bathing/dressing/bathroom;A little help with walking and/or  transfers;Assistance with cooking/housework;Direct supervision/assist for medications management;Direct supervision/assist for financial management;Assist for transportation   Equipment Recommendations  BSC/3in1 (bariatric)    Recommendations for Other Services      Precautions / Restrictions Precautions Precautions: Fall Restrictions Weight Bearing Restrictions: No       Mobility Bed Mobility Overal bed mobility: Independent                  Transfers Overall transfer level: Needs assistance Equipment used: None Transfers: Sit to/from Stand Sit to Stand: Supervision           General transfer comment: Pt declining use of RW during t/f and mobility today. Pt able to walk approx 380 feet in hallway without RW, no loss of balance, on 4L O2.     Balance                                           ADL either performed or assessed with clinical judgement   ADL                                         General ADL Comments: Pt declining ADLs today. Agreeable to functional mobility.    Extremity/Trunk Assessment Upper Extremity Assessment Upper Extremity Assessment: Overall WFL for tasks assessed   Lower Extremity Assessment Lower Extremity  Assessment: Overall WFL for tasks assessed;Generalized weakness        Vision       Perception     Praxis      Cognition Arousal/Alertness: Awake/alert Behavior During Therapy: Impulsive, WFL for tasks assessed/performed Overall Cognitive Status: Within Functional Limits for tasks assessed                                 General Comments: Impulsive; cues for keeping O2 on.        Exercises      Shoulder Instructions       General Comments Pt found on 2L when OT arrived, O2 sat 90%. OT bumped O2 to 4L 2/2 medical record indicating that O2 is usually 4L; improved to 98%. Pt took off O2 to don shoes; needed cue to don O2; OT changed to O2 tank on 4L for mobility.  After mobility in hallway, pt O2 sat 86%, but quickly (approx 1 min) recovered to 96%; left semi-reclined in bed on 4L. Education provided on taking standing rest breaks with pursed lip breathing, as pt reported feeling "slightly" short of breath at the end of session when O2 was low.    Pertinent Vitals/ Pain       Pain Assessment Pain Assessment: No/denies pain  Home Living                                          Prior Functioning/Environment              Frequency  Min 2X/week        Progress Toward Goals  OT Goals(current goals can now be found in the care plan section)  Progress towards OT goals: Progressing toward goals  Acute Rehab OT Goals Patient Stated Goal: go home OT Goal Formulation: With patient Time For Goal Achievement: 11/24/22 Potential to Achieve Goals: Good ADL Goals Pt Will Perform Grooming: Independently;standing Pt Will Perform Lower Body Dressing: Independently;sit to/from stand Pt Will Transfer to Toilet: with modified independence;ambulating Pt Will Perform Toileting - Clothing Manipulation and hygiene: Independently;sit to/from stand  Plan Discharge plan needs to be updated    Co-evaluation                 AM-PAC OT "6 Clicks" Daily Activity     Outcome Measure   Help from another person eating meals?: None Help from another person taking care of personal grooming?: A Little Help from another person toileting, which includes using toliet, bedpan, or urinal?: A Lot Help from another person bathing (including washing, rinsing, drying)?: A Lot Help from another person to put on and taking off regular upper body clothing?: A Little Help from another person to put on and taking off regular lower body clothing?: A Lot 6 Click Score: 16    End of Session Equipment Utilized During Treatment: Oxygen  OT Visit Diagnosis: Muscle weakness (generalized) (M62.81);Pain;Unsteadiness on feet (R26.81);History of falling  (Z91.81)   Activity Tolerance Patient tolerated treatment well   Patient Left in bed;with call bell/phone within reach   Nurse Communication Mobility status;Other (comment) (oxygen status)        Time: [id number] OT Time Calculation (min): 20 min  Charges: OT General Charges $OT Visit: 1 Visit OT Treatments $Therapeutic Activity: 8-22 mins  Cody Amato, MS, OTR/L   Juel Burrow  Megan Salon 11/13/2022, 3:53 PM

## 2022-11-14 DIAGNOSIS — I5033 Acute on chronic diastolic (congestive) heart failure: Secondary | ICD-10-CM | POA: Diagnosis not present

## 2022-11-14 LAB — COMPREHENSIVE METABOLIC PANEL
ALT: 12 U/L (ref 0–44)
AST: 15 U/L (ref 15–41)
Albumin: 3.6 g/dL (ref 3.5–5.0)
Alkaline Phosphatase: 54 U/L (ref 38–126)
Anion gap: 8 (ref 5–15)
BUN: 30 mg/dL — ABNORMAL HIGH (ref 8–23)
CO2: 37 mmol/L — ABNORMAL HIGH (ref 22–32)
Calcium: 8.5 mg/dL — ABNORMAL LOW (ref 8.9–10.3)
Chloride: 88 mmol/L — ABNORMAL LOW (ref 98–111)
Creatinine, Ser: 1.03 mg/dL (ref 0.61–1.24)
GFR, Estimated: >60 mL/min (ref >60)
Glucose, Bld: 198 mg/dL — ABNORMAL HIGH (ref 70–99)
Potassium: 3.4 mmol/L — ABNORMAL LOW (ref 3.5–5.1)
Sodium: 133 mmol/L — ABNORMAL LOW (ref 135–145)
Total Bilirubin: 0.9 mg/dL (ref 0.3–1.2)
Total Protein: 6.9 g/dL (ref 6.5–8.1)

## 2022-11-14 LAB — CBC
HCT: 42.4 % (ref 39.0–52.0)
Hemoglobin: 13.1 g/dL (ref 13.0–17.0)
MCH: 28.3 pg (ref 26.0–34.0)
MCHC: 30.9 g/dL (ref 30.0–36.0)
MCV: 91.6 fL (ref 80.0–100.0)
Platelets: 235 10*3/uL (ref 150–400)
RBC: 4.63 MIL/uL (ref 4.22–5.81)
RDW: 13.4 % (ref 11.5–15.5)
WBC: 8.6 10*3/uL (ref 4.0–10.5)
nRBC: 0 % (ref 0.0–0.2)

## 2022-11-14 LAB — GLUCOSE, CAPILLARY
Glucose-Capillary: 166 mg/dL — ABNORMAL HIGH (ref 70–99)
Glucose-Capillary: 192 mg/dL — ABNORMAL HIGH (ref 70–99)
Glucose-Capillary: 142 mg/dL — ABNORMAL HIGH (ref 70–99)
Glucose-Capillary: 335 mg/dL — ABNORMAL HIGH (ref 70–99)

## 2022-11-14 LAB — PHOSPHORUS: Phosphorus: 3.5 mg/dL (ref 2.5–4.6)

## 2022-11-14 LAB — MAGNESIUM: Magnesium: 2 mg/dL (ref 1.7–2.4)

## 2022-11-14 MED ORDER — FUROSEMIDE 40 MG PO TABS
40.0000 mg | ORAL_TABLET | Freq: Two times a day (BID) | ORAL | Status: DC
  Administered 2022-11-15: 40 mg via ORAL
  Filled 2022-11-14: qty 1

## 2022-11-14 MED ORDER — SENNOSIDES-DOCUSATE SODIUM 8.6-50 MG PO TABS
1.0000 | ORAL_TABLET | Freq: Every day | ORAL | Status: DC
  Administered 2022-11-14: 1 via ORAL
  Filled 2022-11-14: qty 1

## 2022-11-14 MED ORDER — POLYETHYLENE GLYCOL 3350 17 G PO PACK
17.0000 g | PACK | Freq: Once | ORAL | Status: AC
  Administered 2022-11-14: 17 g via ORAL
  Filled 2022-11-14: qty 1

## 2022-11-14 MED ORDER — DOCUSATE SODIUM 100 MG PO CAPS
100.0000 mg | ORAL_CAPSULE | Freq: Two times a day (BID) | ORAL | Status: DC
  Administered 2022-11-14 (×2): 100 mg via ORAL
  Filled 2022-11-14 (×3): qty 1

## 2022-11-14 MED ORDER — POTASSIUM CHLORIDE CRYS ER 20 MEQ PO TBCR
40.0000 meq | EXTENDED_RELEASE_TABLET | Freq: Once | ORAL | Status: AC
  Administered 2022-11-14: 40 meq via ORAL
  Filled 2022-11-14: qty 2

## 2022-11-14 NOTE — Progress Notes (Signed)
SUBJECTIVE: Patient is feeling much better denies any chest pain or shortness of breath HPI   Vitals:   11/14/22 0416 11/14/22 0819 11/14/22 0847 11/14/22 1206  BP: (!) 140/104 (!) 98/51  103/68  Pulse: 62 62  60  Resp: 18 17  20   Temp: 97.8 F (36.6 C) 97.7 F (36.5 C)  97.8 F (36.6 C)  TempSrc:      SpO2: 97% 97%  95%  Weight:   (!) 140.1 kg   Height:        Intake/Output Summary (Last 24 hours) at 11/14/2022 1419 Last data filed at 11/14/2022 1413 Gross per 24 hour  Intake 1200 ml  Output 850 ml  Net 350 ml    LABS: Basic Metabolic Panel: Recent Labs    11/13/22 0439 11/14/22 0455  NA 136 133*  K 3.8 3.4*  CL 91* 88*  CO2 37* 37*  GLUCOSE 306* 198*  BUN 29* 30*  CREATININE 1.08 1.03  CALCIUM 8.8* 8.5*  MG  --  2.0  PHOS  --  3.5   Liver Function Tests: Recent Labs    11/14/22 0455  AST 15  ALT 12  ALKPHOS 54  BILITOT 0.9  PROT 6.9  ALBUMIN 3.6   No results for input(s): "LIPASE", "AMYLASE" in the last 72 hours. CBC: Recent Labs    11/12/22 0540 11/13/22 0439 11/14/22 0455  WBC 9.0 9.4 8.6  NEUTROABS 8.2* 7.0  --   HGB 12.7* 12.7* 13.1  HCT 41.2 41.4 42.4  MCV 92.8 92.8 91.6  PLT 229 235 235   Cardiac Enzymes: No results for input(s): "CKTOTAL", "CKMB", "CKMBINDEX", "TROPONINI" in the last 72 hours. BNP: Invalid input(s): "POCBNP" D-Dimer: No results for input(s): "DDIMER" in the last 72 hours. Hemoglobin A1C: No results for input(s): "HGBA1C" in the last 72 hours. Fasting Lipid Panel: No results for input(s): "CHOL", "HDL", "LDLCALC", "TRIG", "CHOLHDL", "LDLDIRECT" in the last 72 hours. Thyroid Function Tests: No results for input(s): "TSH", "T4TOTAL", "T3FREE", "THYROIDAB" in the last 72 hours.  Invalid input(s): "FREET3" Anemia Panel: No results for input(s): "VITAMINB12", "FOLATE", "FERRITIN", "TIBC", "IRON", "RETICCTPCT" in the last 72 hours.   PHYSICAL EXAM General: Well developed, well nourished, in no acute  distress HEENT:  Normocephalic and atramatic Neck:  No JVD.  Lungs: Clear bilaterally to auscultation and percussion. Heart: HRRR . Normal S1 and S2 without gallops or murmurs.  Abdomen: Bowel sounds are positive, abdomen soft and non-tender  Msk:  Back normal, normal gait. Normal strength and tone for age. Extremities: No clubbing, cyanosis or edema.   Neuro: Alert and oriented X 3. Psych:  Good affect, responds appropriately  TELEMETRY: Normal sinus rhythm  ASSESSMENT AND PLAN: Congestive heart failure.  Heart failure is compensated now with chest pain resolved and shortness of breath.  Swelling of the legs has significantly improved.  Switch to p.o. Lasix and can be discharged with follow-up his primary cardiologist.  Will sign off.   ICD-10-CM   1. Acute on chronic congestive heart failure, unspecified heart failure type ([employer])  I50.9     2. Morbid obesity ([place])  E66.01     3. Type 2 diabetes mellitus without complication, with long-term current use of insulin (HCC)  E11.9    Z79.4       Principal Problem:   Acute on chronic diastolic CHF (congestive heart failure) (HCC) Active Problems:   Chronic obstructive pulmonary disease (COPD) (HCC)   Type 2 diabetes mellitus with hyperglycemia, with long-term current use of  insulin (HCC)   Essential hypertension   Obesity, Class III, BMI 40-49.9 (morbid obesity) (HCC)   OSA (obstructive sleep apnea)   Chronic respiratory failure with hypoxia (HCC)   Elevated troponin   CAD (coronary artery disease)   Chest pain   Bilateral leg pain    Neoma Laming, MD, [facility] 11/14/2022 2:19 PM

## 2022-11-14 NOTE — TOC Progression Note (Signed)
Transition of Care [facility]) - Progression Note    Patient Details  Name: Cody Alexander MRN: [id number] Date of Birth: [birth year]  Transition of Care [facility]) CM/SW Contact  Laurena Slimmer, RN Phone Number: 11/14/2022, 3:52 PM  Clinical Narrative:    Patient is active with [place].  Message sent to [place] at [place].    Expected Discharge Plan: [place] Barriers to Discharge: Continued Medical Work up  Expected Discharge Plan and [place] Choice: [place] Living arrangements for the past 2 months: [place]                                       Social Determinants of Health (SDOH) Interventions SDOH Screenings   Food Insecurity: No Food Insecurity (11/10/2022)  Housing: Low Risk  (11/10/2022)  Transportation Needs: No Transportation Needs (11/10/2022)  Utilities: Not At Risk (11/10/2022)  Tobacco Use: Medium Risk (11/09/2022)    Readmission Risk Interventions    06/25/2022   10:13 AM  Readmission Risk Prevention Plan  Transportation Screening Complete  PCP or Specialist Appt within 3-5 Days Complete  HRI or [place] Complete  Social Work Consult for [place] Planning/Counseling Complete  Palliative Care Screening Not Applicable  Medication Review Press photographer) Complete

## 2022-11-14 NOTE — Progress Notes (Signed)
Physical Therapy Treatment Patient Details Name: Cody Alexander MRN: [id number] DOB: Jul 13, 1956 Today's Date: 11/14/2022   History of Present Illness Cody Alexander is a 67 y.o. male with medical history significant of Combined CHF with recovered EF (EF 65% G1 DD 07/22/2022), COPD, CAD s/p stent x1, HTN, T2DM , class III obesity, OSA, recently hospitalized from 2/25 to 2/29 at [facility] for CHF exacerbation after presenting with a 20 pound weight gain believe related to diuretic nonadherence who presents to the ED with shortness of breath, lower extremity edema and piercing chest pain.  Patient states that when he left the hospital he was given a 1 week supply of Lasix to replace his Bumex but then ran out and had trouble getting a refill and as such has been out of diuretics for the past 2 weeks.  He denies cough fever or chills.  He complains of severe lower extremity pain which started 2 months ago but over the past few days has been worse.    PT Comments    Pt feels fluid retention/edema is improving. SpO2 95% at rest on 4L O2. Pt completed gait training in hallway on RA (pt's request) ~153ft with Supervision and vc's to slow cadence, slight drifting, no LOB. Pt's SpO2 upon completion was 80%, 4L O2 applied, SpO2 at 90% after one minute. Pt educated on pacing himself and slowing cadence down while ambulating to preserve energy and O2 exchange. Continue PT per POC.   Recommendations for follow up therapy are one component of a multi-disciplinary discharge planning process, led by the attending physician.  Recommendations may be updated based on patient status, additional functional criteria and insurance authorization.  Follow Up Recommendations  Home health PT Can patient physically be transported by private vehicle: Yes   Assistance Recommended at Discharge Intermittent Supervision/Assistance  Patient can return home with the following A little help with bathing/dressing/bathroom;Assistance  with cooking/housework;Assist for transportation;Help with stairs or ramp for entrance   Equipment Recommendations  Rolling walker (2 wheels)    Recommendations for Other Services       Precautions / Restrictions Precautions Precautions: Fall Restrictions Weight Bearing Restrictions: No     Mobility  Bed Mobility Overal bed mobility: Independent Bed Mobility: Supine to Sit     Supine to sit: Modified independent (Device/Increase time) Sit to supine: Modified independent (Device/Increase time)        Transfers Overall transfer level: Needs assistance Equipment used: None Transfers: Sit to/from Stand Sit to Stand: Supervision           General transfer comment:  (Supervision due to impulsivity and trouble slowing self down)    Ambulation/Gait Ambulation/Gait assistance: Supervision Gait Distance (Feet): 180 Feet Assistive device: None Gait Pattern/deviations: Step-through pattern, Drifts right/left, Wide base of support       General Gait Details:  (Still slightly unsteady due to pt's difficulty in slowing pace down. No LOB)   Stairs             Wheelchair Mobility    Modified Rankin (Stroke Patients Only)       Balance Overall balance assessment: Needs assistance Sitting-balance support: Feet supported Sitting balance-Leahy Scale: Normal     Standing balance support: No upper extremity supported Standing balance-Leahy Scale: Fair Standing balance comment: able to stand and perform prolonged bout of ambulation w/o AD, mild unsteadiness/impulsivity with no overt LOBs  Cognition Arousal/Alertness: Awake/alert Behavior During Therapy: Impulsive, WFL for tasks assessed/performed Overall Cognitive Status: Within Functional Limits for tasks assessed                                 General Comments: Impulsive, talkative        Exercises      General Comments General comments (skin  integrity, edema, etc.): Pt has been on home O2 for 3 years, tends to remove it for outdoor activity      Pertinent Vitals/Pain Pain Assessment Pain Assessment: No/denies pain    Home Living                          Prior Function            PT Goals (current goals can now be found in the care plan section) Acute Rehab PT Goals Patient Stated Goal: be able to walk Progress towards PT goals: Progressing toward goals    Frequency    Min 2X/week      PT Plan Current plan remains appropriate    Co-evaluation              AM-PAC PT "6 Clicks" Mobility   Outcome Measure  Help needed turning from your back to your side while in a flat bed without using bedrails?: A Little Help needed moving from lying on your back to sitting on the side of a flat bed without using bedrails?: A Little Help needed moving to and from a bed to a chair (including a wheelchair)?: A Little Help needed standing up from a chair using your arms (e.g., wheelchair or bedside chair)?: None Help needed to walk in hospital room?: A Little Help needed climbing 3-5 steps with a railing? : A Little 6 Click Score: 19    End of Session   Activity Tolerance: Patient tolerated treatment well Patient left: in chair;with call bell/phone within reach Nurse Communication: Mobility status (SpO2) PT Visit Diagnosis: Other abnormalities of gait and mobility (R26.89);Difficulty in walking, not elsewhere classified (R26.2);Muscle weakness (generalized) (M62.81);Unsteadiness on feet (R26.81);History of falling (Z91.81);Pain     Time: 1337-1350 PT Time Calculation (min) (ACUTE ONLY): 13 min  Charges:  $Gait Training: 8-22 mins                    Mikel Cella, PTA   Josie Dixon 11/14/2022, 2:01 PM

## 2022-11-14 NOTE — Progress Notes (Signed)
  Progress Note   Patient: Cody Alexander [id number] DOB: 1956-07-10 DOA: 11/09/2022     5 DOS: the patient was seen and examined on 11/14/2022   Brief hospital course:  Assessment and Plan: * Acute on chronic diastolic CHF (congestive heart failure) (HCC) - IV lasix 80 mg bid  - ASA 81 mg PO daily  - Lipitor 40 mg PO daily  - Aldactone 12.5 mg PO daily    CAD (coronary artery disease) - Management as above  - Cardiology is following    Bilateral leg pain - IV morphine 2 mg q2 hr PRN  - Lyrica 50 mg PO bid  - Oxycodone 15 mg PO q6 hr PRN    Essential hypertension - Isordil 30 mg PO daily  - Lisinopril 20 mg PO daily  - Lopressor 25 mg PO bid    Chronic obstructive pulmonary disease (COPD) (HCC) Chronic respiratory failure with hypoxia - Dulera 2 puff bid  - Duoneb bid  - Prednisone 40 mg PO daily    Type 2 diabetes mellitus with hyperglycemia, with long-term current use of insulin (HCC) Diabetic neuropathy - Novolog SS ACHS  - Novolog 16 units sq tid  - Detemir 30 units sq daily    OSA (obstructive sleep apnea) - CPAP nightly   Obesity, Class III, BMI 40-49.9 (morbid obesity) ([place]) - Complicating care    DVT prophylaxis: Lovenox 72.5 mg sq daily       Subjective: Pt seen and examined at the bedside. His lower legs are indeed smaller. Dr. Humphrey Rolls will see the pt today in follow up. He continues on IV lasix.  Physical Exam: Vitals:   11/13/22 2338 11/14/22 0416 11/14/22 0819 11/14/22 0847  BP: 129/85 (!) 140/104 (!) 98/51   Pulse: 71 62 62   Resp: 18 18 17    Temp: 97.9 F (36.6 C) 97.8 F (36.6 C) 97.7 F (36.5 C)   TempSrc:      SpO2: 99% 97% 97%   Weight:    (!) 140.1 kg  Height:       Constitutional:      Appearance: He is well-developed.  HENT:     Head: Normocephalic and atraumatic.  Cardiovascular:     Rate and Rhythm: Normal rate and regular rhythm.  Pulmonary:     Effort: Pulmonary effort is normal.  Abdominal:     Palpations:  Abdomen is soft.     Comments: Obese  Musculoskeletal:     Cervical back: Normal range of motion.  Skin:    General: Skin is warm.  Neurological:     Mental Status: He is alert and oriented to person, place, and time.  Psychiatric:        Mood and Affect: Mood normal.   Data Reviewed:   Disposition: Status is: Inpatient  Planned Discharge Destination: Barriers to discharge: IV lasix and PT eval     Time spent: 35 minutes  Author: Lucienne Minks , MD 11/14/2022 11:51 AM  For on call review [email or web address].

## 2022-11-15 DIAGNOSIS — I5033 Acute on chronic diastolic (congestive) heart failure: Secondary | ICD-10-CM | POA: Diagnosis not present

## 2022-11-15 LAB — COMPREHENSIVE METABOLIC PANEL
ALT: 14 U/L (ref 0–44)
AST: 16 U/L (ref 15–41)
Albumin: 3.4 g/dL — ABNORMAL LOW (ref 3.5–5.0)
Alkaline Phosphatase: 51 U/L (ref 38–126)
Anion gap: 14 (ref 5–15)
BUN: 31 mg/dL — ABNORMAL HIGH (ref 8–23)
CO2: 38 mmol/L — ABNORMAL HIGH (ref 22–32)
Calcium: 9.2 mg/dL (ref 8.9–10.3)
Chloride: 88 mmol/L — ABNORMAL LOW (ref 98–111)
Creatinine, Ser: 1 mg/dL (ref 0.61–1.24)
GFR, Estimated: >60 mL/min (ref >60)
Glucose, Bld: 206 mg/dL — ABNORMAL HIGH (ref 70–99)
Potassium: 4.2 mmol/L (ref 3.5–5.1)
Sodium: 137 mmol/L (ref 135–145)
Total Bilirubin: 0.7 mg/dL (ref 0.3–1.2)
Total Protein: 6.6 g/dL (ref 6.5–8.1)

## 2022-11-15 LAB — MAGNESIUM: Magnesium: 2.3 mg/dL (ref 1.7–2.4)

## 2022-11-15 LAB — CBC
HCT: 40.5 % (ref 39.0–52.0)
Hemoglobin: 12.7 g/dL — ABNORMAL LOW (ref 13.0–17.0)
MCH: 28.9 pg (ref 26.0–34.0)
MCHC: 31.4 g/dL (ref 30.0–36.0)
MCV: 92 fL (ref 80.0–100.0)
Platelets: 237 10*3/uL (ref 150–400)
RBC: 4.4 MIL/uL (ref 4.22–5.81)
RDW: 13.2 % (ref 11.5–15.5)
WBC: 9.1 10*3/uL (ref 4.0–10.5)
nRBC: 0 % (ref 0.0–0.2)

## 2022-11-15 LAB — GLUCOSE, CAPILLARY: Glucose-Capillary: 167 mg/dL — ABNORMAL HIGH (ref 70–99)

## 2022-11-15 LAB — PHOSPHORUS: Phosphorus: 3.2 mg/dL (ref 2.5–4.6)

## 2022-11-15 MED ORDER — ASPIRIN 81 MG PO TBEC: 81.0000 mg | DELAYED_RELEASE_TABLET | Freq: Every day | ORAL | 0 refills | Status: AC

## 2022-11-15 MED ORDER — OXYCODONE-ACETAMINOPHEN 5-325 MG PO TABS: 1.0000 | ORAL_TABLET | Freq: Four times a day (QID) | ORAL | 0 refills | Status: AC | PRN

## 2022-11-15 MED ORDER — FUROSEMIDE 40 MG PO TABS: 40.0000 mg | ORAL_TABLET | Freq: Two times a day (BID) | ORAL | 0 refills | Status: DC

## 2022-11-15 MED ORDER — DOCUSATE SODIUM 100 MG PO CAPS: 100.0000 mg | ORAL_CAPSULE | Freq: Two times a day (BID) | ORAL | 0 refills | Status: DC

## 2022-11-15 NOTE — Progress Notes (Signed)
SUBJECTIVE: Patient denies any chest pain or shortness of breath HPI   Vitals:   11/14/22 2011 11/14/22 2012 11/15/22 0005 11/15/22 0411  BP:  126/80 (!) 130/92 (!) 141/93  Pulse:  72 61 (!) 58  Resp:  18 18 20   Temp:  97.7 F (36.5 C) 97.8 F (36.6 C) 98.1 F (36.7 C)  TempSrc:      SpO2: 97% 97% 97% 96%  Weight:      Height:        Intake/Output Summary (Last 24 hours) at 11/15/2022 0753 Last data filed at 11/15/2022 0500 Gross per 24 hour  Intake 1700 ml  Output 1050 ml  Net 650 ml    LABS: Basic Metabolic Panel: Recent Labs    11/14/22 0455 11/15/22 0435  NA 133* 137  K 3.4* 4.2  CL 88* 88*  CO2 37* 38*  GLUCOSE 198* 206*  BUN 30* 31*  CREATININE 1.03 1.00  CALCIUM 8.5* 9.2  MG 2.0 2.3  PHOS 3.5 3.2   Liver Function Tests: Recent Labs    11/14/22 0455 11/15/22 0435  AST 15 16  ALT 12 14  ALKPHOS 54 51  BILITOT 0.9 0.7  PROT 6.9 6.6  ALBUMIN 3.6 3.4*   No results for input(s): "LIPASE", "AMYLASE" in the last 72 hours. CBC: Recent Labs    11/13/22 0439 11/14/22 0455 11/15/22 0435  WBC 9.4 8.6 9.1  NEUTROABS 7.0  --   --   HGB 12.7* 13.1 12.7*  HCT 41.4 42.4 40.5  MCV 92.8 91.6 92.0  PLT 235 235 237   Cardiac Enzymes: No results for input(s): "CKTOTAL", "CKMB", "CKMBINDEX", "TROPONINI" in the last 72 hours. BNP: Invalid input(s): "POCBNP" D-Dimer: No results for input(s): "DDIMER" in the last 72 hours. Hemoglobin A1C: No results for input(s): "HGBA1C" in the last 72 hours. Fasting Lipid Panel: No results for input(s): "CHOL", "HDL", "LDLCALC", "TRIG", "CHOLHDL", "LDLDIRECT" in the last 72 hours. Thyroid Function Tests: No results for input(s): "TSH", "T4TOTAL", "T3FREE", "THYROIDAB" in the last 72 hours.  Invalid input(s): "FREET3" Anemia Panel: No results for input(s): "VITAMINB12", "FOLATE", "FERRITIN", "TIBC", "IRON", "RETICCTPCT" in the last 72 hours.   PHYSICAL EXAM General: Well developed, well nourished, in no acute  distress HEENT:  Normocephalic and atramatic Neck:  No JVD.  Lungs: Clear bilaterally to auscultation and percussion. Heart: HRRR . Normal S1 and S2 without gallops or murmurs.  Abdomen: Bowel sounds are positive, abdomen soft and non-tender  Msk:  Back normal, normal gait. Normal strength and tone for age. Extremities: No clubbing, cyanosis or edema.   Neuro: Alert and oriented X 3. Psych:  Good affect, responds appropriately  TELEMETRY: Sinus rhythm  ASSESSMENT AND PLAN: Patient appears to be feeling much better denies any chest pain or shortness of breath and swelling of the legs has resolved.  Patient can be discharged on p.o. Lasix with follow-up with primary cardiologist.  Will sign off.   ICD-10-CM   1. Acute on chronic congestive heart failure, unspecified heart failure type ([place])  I50.9     2. Morbid obesity ([place])  E66.01     3. Type 2 diabetes mellitus without complication, with long-term current use of insulin (HCC)  E11.9    Z79.4       Principal Problem:   Acute on chronic diastolic CHF (congestive heart failure) (HCC) Active Problems:   Chronic obstructive pulmonary disease (COPD) ([place])   Type 2 diabetes mellitus with hyperglycemia, with long-term current use of insulin ([place])  Essential hypertension   Obesity, Class III, BMI 40-49.9 (morbid obesity) (HCC)   OSA (obstructive sleep apnea)   Chronic respiratory failure with hypoxia (HCC)   Elevated troponin   CAD (coronary artery disease)   Chest pain   Bilateral leg pain    Neoma Laming, MD, [facility] 11/15/2022 7:53 AM

## 2022-11-15 NOTE — TOC Transition Note (Signed)
Transition of Care [facility]) - CM/SW Discharge Note   Patient Details  Name: Cody Alexander MRN: [id number] Date of Birth: 11-Aug-1956  [facility]) CM/SW Contact:  Laurena Slimmer, RN Phone Number: 11/15/2022, 10:04 AM   Clinical Narrative:    Spoke with patient regarding discharge today.  He will transport himself home by car.  He was advised he was still activd with [facility] and they would be in contact with him for SOC  [place] from [place] notified of discharge.   TOC signing off.    Final next level of care: Skilled Nursing Facility Barriers to Discharge: Continued Medical Work up   Patient Goals and CMS Choice CMS Medicare.gov Compare Post Acute Care list provided to:: Patient Choice offered to / list presented to : Patient  Discharge Placement                         Discharge Plan and Services Additional resources added to the After Visit Summary for       Post Acute Care Choice: [place]                               Social Determinants of Health (SDOH) Interventions SDOH Screenings   Food Insecurity: No Food Insecurity (11/10/2022)  Housing: [place]  (11/10/2022)  Transportation Needs: No Transportation Needs (11/10/2022)  Utilities: Not At Risk (11/10/2022)  Tobacco Use: Medium Risk (11/09/2022)     Readmission Risk Interventions    06/25/2022   10:13 AM  Readmission Risk Prevention Plan  Transportation Screening Complete  PCP or Specialist Appt within 3-5 Days Complete  HRI or [place] Complete  Social Work Consult for [place] Planning/Counseling Complete  Palliative Care Screening Not Applicable  Medication Review Press photographer) Complete

## 2022-11-15 NOTE — Discharge Summary (Signed)
Physician Discharge Summary   Patient: Cody Alexander MRN: [id number] DOB: 03-02-1956  Admit date:     11/09/2022  Discharge date: 11/15/22  Discharge Physician: Lucienne Minks    PCP: Tat[place]   Recommendations at discharge:    Please take the lasix as prescribed   Discharge Diagnoses: Principal Problem:   Acute on chronic diastolic CHF (congestive heart failure) ([place]) Active Problems:   CAD (coronary artery disease)   Elevated troponin   Chest pain   Bilateral leg pain   Chronic obstructive pulmonary disease (COPD) ([place])   Essential hypertension   Type 2 diabetes mellitus with hyperglycemia, with long-term current use of insulin (HCC)   Obesity, Class III, BMI 40-49.9 (morbid obesity) (HCC)   OSA (obstructive sleep apnea)   Chronic respiratory failure with hypoxia ([place])  Resolved Problems:   * No resolved hospital problems. *  Hospital Course: 67 yo M who was treated for acute on chronic diastolic CHF. Cardiology was consulted. Pt was treated with IV lasix, ASA, lipitor and aldactone.  He required several days of IV lasix for adequate diuresis. On the afternoon of 11/15/2022 he was transitioned to PO lasix. HTN was controlled with isordil, lisinopril and lopressor. ECHO showed LVEF 65-70% and no regional wall motion abnormalities. PT/OT was consulted and he was safe for discharge home. He will go home with scripts for PO lasix as per the cardiologist (Dr. Neoma Laming).  Assessment and Plan: * Acute on chronic diastolic CHF (congestive heart failure) ([place]) Secondary to medication and adherence. Review of recent discharge summary from [facility] reveals that patient has had several hospitalizations for CHF for similar reasons EF 65% G1 DD 07/22/2022 - IV Lasix - Continue spironolactone, metoprolol, isosorbide - Daily weights with intake and output monitoring - Counseled on importance of adherence with medication and diet  CAD (coronary artery  disease) Atypical chest pain with elevated troponin Troponin 100>104 and patient reports chest pain feeling like needles piercing his chest Suspect troponin elevation is related to demand ischemia CTA chest ruled out PE as etiology for his chest pain Patient had a similar pain when hospitalized at [facility] in February and was referred for outpatient stress test but did not get it - Continue to trend troponins - Continue metoprolol, isosorbide, atorvastatin - Nitroglycerin as needed chest pain -Echocardiogram to evaluate for wall motion abnormality - Cardiology consult  Bilateral leg pain Suspect secondary to a combination of diabetic neuropathy and swelling from CHF Patient was given Lyrica 50 mg twice daily when hospitalized in February Patient says oxycodone helped with his leg pain - Will get lower extremity venous Doppler to evaluate for DVT given no CTA chest negative - Will resume Lyrica  Essential hypertension Continue metoprolol and lisinopril  Chronic obstructive pulmonary disease (COPD) ([place]) Chronic respiratory failure with hypoxia Wheezing heard Continue home inhalers DuoNebs as needed Continue supplemental oxygen  Type 2 diabetes mellitus with hyperglycemia, with long-term current use of insulin (HCC) Diabetic neuropathy Blood sugar 227.  A1c in February was over 10 Continue basal insulin Sliding scale insulin coverage Lyrica 50 mg twice daily  OSA (obstructive sleep apnea) CPAP nightly  Obesity, Class III, BMI 40-49.9 (morbid obesity) ([place]) Complicating factor to overall prognosis and care        Pain control - [facility] Controlled Substance Reporting System database was reviewed. and patient was instructed, not to drive, operate heavy machinery, perform activities at heights, swimming or participation in water activities or provide baby-sitting services while on Pain,  Sleep and Anxiety Medications; until their outpatient Physician has advised to do so  again. Also recommended to not to take more than prescribed Pain, Sleep and Anxiety Medications.  Consultants: Cardiology  Disposition: Home Diet recommendation:  Cardiac diet DISCHARGE MEDICATION: Allergies as of 11/15/2022       Reactions   Erythromycin Anaphylaxis, Swelling   Had eye swelling with erythromycin during a time when he had perf ear drum. Tolerated azithromycin.        Medication List     STOP taking these medications    acetaminophen 500 MG tablet Commonly known as: TYLENOL   bumetanide 1 MG tablet Commonly known as: BUMEX   metoprolol succinate 25 MG 24 hr tablet Commonly known as: TOPROL-XL   terbinafine 250 MG tablet Commonly known as: LAMISIL       TAKE these medications    aspirin EC 81 MG tablet Take 1 tablet (81 mg total) by mouth daily. Swallow whole.   atorvastatin 40 MG tablet Commonly known as: LIPITOR Take 40 mg by mouth daily.   budesonide-formoterol 160-4.5 MCG/ACT inhaler Commonly known as: SYMBICORT Inhale 2 puffs into the lungs 2 (two) times daily.   diclofenac Sodium 1 % Gel Commonly known as: VOLTAREN Apply 2 g topically in the morning and at bedtime.   docusate sodium 100 MG capsule Commonly known as: COLACE Take 1 capsule (100 mg total) by mouth 2 (two) times daily.   furosemide 40 MG tablet Commonly known as: LASIX Take 1 tablet (40 mg total) by mouth 2 (two) times daily. What changed:  when to take this Another medication with the same name was removed. Continue taking this medication, and follow the directions you see here.   gabapentin 600 MG tablet Commonly known as: NEURONTIN Take 600 mg by mouth 3 (three) times daily.   hydrOXYzine 10 MG tablet Commonly known as: ATARAX Take 1 tablet (10 mg total) by mouth 3 (three) times daily as needed. Home med.   insulin detemir 100 UNIT/ML FlexPen Commonly known as: LEVEMIR Inject 30 Units into the skin in the morning and at bedtime.   insulin lispro 100  UNIT/ML KwikPen Commonly known as: HUMALOG Inject 10 Units into the skin 3 (three) times daily with meals.   ipratropium-albuterol 0.5-2.5 (3) MG/3ML Soln Commonly known as: DUONEB Take 3 mLs by nebulization every 6 (six) hours as needed.   isosorbide dinitrate 30 MG tablet Commonly known as: ISORDIL Take 30 mg by mouth every morning.   Klor-Con M10 10 MEQ tablet Generic drug: potassium chloride Take 10 mEq by mouth daily.   Lantus SoloStar 100 UNIT/ML Solostar Pen Generic drug: insulin glargine Inject 40 Units into the skin daily.   lisinopril 20 MG tablet Commonly known as: ZESTRIL Take 20 mg by mouth daily. What changed: Another medication with the same name was removed. Continue taking this medication, and follow the directions you see here.   magnesium oxide 400 MG tablet Commonly known as: MAG-OX Take 1 tablet by mouth 2 (two) times daily.   metFORMIN 1000 MG tablet Commonly known as: GLUCOPHAGE Take 1,000 mg by mouth 2 (two) times daily.   metoprolol tartrate 25 MG tablet Commonly known as: LOPRESSOR Take 25 mg by mouth 2 (two) times daily.   oxyCODONE-acetaminophen 5-325 MG tablet Commonly known as: Percocet Take 1 tablet by mouth every 6 (six) hours as needed for up to 2 days for severe pain.   pregabalin 25 MG capsule Commonly known as: LYRICA Take 25 mg by  mouth 2 (two) times daily.   ProAir HFA 108 (90 Base) MCG/ACT inhaler Generic drug: albuterol Inhale 2 puffs into the lungs every 4 (four) hours as needed for wheezing.   albuterol (2.5 MG/3ML) 0.083% nebulizer solution Commonly known as: PROVENTIL Take 2.5 mg by nebulization every 4 (four) hours as needed.   Selenium Sulfide 2.25 % Sham SMARTSIG:Liberally Topical Daily   spironolactone 25 MG tablet Commonly known as: ALDACTONE Take 12.5 mg by mouth daily.   traMADol 50 MG tablet Commonly known as: ULTRAM Take 50 mg by mouth 2 (two) times daily as needed.   triamcinolone cream 0.1  % Commonly known as: KENALOG Apply 1 Application topically 2 (two) times daily.        Discharge Exam: Filed Weights   11/11/22 0431 11/13/22 0806 11/14/22 0847  Weight: (!) 145.7 kg (!) 140.3 kg (!) 140.1 kg   Condition at discharge: fair  The results of significant diagnostics from this hospitalization (including imaging, microbiology, ancillary and laboratory) are listed below for reference.   Imaging Studies: ECHOCARDIOGRAM COMPLETE  Result Date: 11/11/2022    ECHOCARDIOGRAM REPORT   Patient Name:   NIKLAS FAIRFIELD Date of Exam: 11/11/2022 Medical Rec #:  [id number]         Height:       73.0 in Accession #:    [id number]        Weight:       321.2 lb Date of Birth:  1956-06-14         BSA:          2.632 m Patient Age:    89 years          BP:           96/70 mmHg Patient Gender: M                 HR:           66 bpm. Exam Location:  [facility] Procedure: 2D Echo, Cardiac Doppler and Color Doppler Indications:     Chest Pain  History:         Patient has prior history of Echocardiogram examinations, most                  recent 06/20/2022. CHF, CAD and Previous Myocardial Infarction,                  COPD, Signs/Symptoms:Chest Pain and Syncope; Risk                  Factors:Hypertension, Diabetes, Dyslipidemia and Sleep Apnea.  Sonographer:     Wenda Low Referring Phys:  [id number] Athena Masse Diagnosing Phys: Kathlyn Sacramento MD  Sonographer Comments: Technically difficult study due to poor echo windows and patient is obese. Image acquisition challenging due to COPD. IMPRESSIONS  1. Left ventricular ejection fraction, by estimation, is 65 to 70%. The left ventricle has normal function. The left ventricle has no regional wall motion abnormalities. There is moderate concentric left ventricular hypertrophy. Left ventricular diastolic parameters are consistent with Grade I diastolic dysfunction (impaired relaxation).  2. Right ventricular systolic function is normal. The right ventricular size  is normal. Tricuspid regurgitation signal is inadequate for assessing PA pressure.  3. Left atrial size was mild to moderately dilated.  4. Right atrial size was mildly dilated.  5. The mitral valve is normal in structure. No evidence of mitral valve regurgitation. No evidence of mitral stenosis.  6. The aortic valve is normal in  structure. Aortic valve regurgitation is not visualized. No aortic stenosis is present.  7. Aortic dilatation noted. There is mild dilatation of the aortic root, measuring 41 mm.  8. The inferior vena cava is normal in size with greater than 50% respiratory variability, suggesting right atrial pressure of 3 mmHg. FINDINGS  Left Ventricle: Left ventricular ejection fraction, by estimation, is 65 to 70%. The left ventricle has normal function. The left ventricle has no regional wall motion abnormalities. The left ventricular internal cavity size was normal in size. There is  moderate concentric left ventricular hypertrophy. Left ventricular diastolic parameters are consistent with Grade I diastolic dysfunction (impaired relaxation). Right Ventricle: The right ventricular size is normal. No increase in right ventricular wall thickness. Right ventricular systolic function is normal. Tricuspid regurgitation signal is inadequate for assessing PA pressure. The tricuspid regurgitant velocity is 1.85 m/s, and with an assumed right atrial pressure of 3 mmHg, the estimated right ventricular systolic pressure is 123456 mmHg. Left Atrium: Left atrial size was mild to moderately dilated. Right Atrium: Right atrial size was mildly dilated. Pericardium: There is no evidence of pericardial effusion. Mitral Valve: The mitral valve is normal in structure. No evidence of mitral valve regurgitation. No evidence of mitral valve stenosis. MV peak gradient, 3.6 mmHg. The mean mitral valve gradient is 1.0 mmHg. Tricuspid Valve: The tricuspid valve is normal in structure. Tricuspid valve regurgitation is not  demonstrated. No evidence of tricuspid stenosis. Aortic Valve: The aortic valve is normal in structure. Aortic valve regurgitation is not visualized. No aortic stenosis is present. Aortic valve mean gradient measures 6.0 mmHg. Aortic valve peak gradient measures 11.0 mmHg. Aortic valve area, by VTI measures 2.90 cm. Pulmonic Valve: The pulmonic valve was normal in structure. Pulmonic valve regurgitation is not visualized. No evidence of pulmonic stenosis. Aorta: Aortic dilatation noted. There is mild dilatation of the aortic root, measuring 41 mm. Venous: The inferior vena cava is normal in size with greater than 50% respiratory variability, suggesting right atrial pressure of 3 mmHg. IAS/Shunts: No atrial level shunt detected by color flow Doppler.  LEFT VENTRICLE PLAX 2D LVIDd:         4.60 cm   Diastology LVIDs:         2.70 cm   LV e' medial:    7.40 cm/s LV PW:         1.80 cm   LV E/e' medial:  9.4 LV IVS:        2.10 cm   LV e' lateral:   7.83 cm/s LVOT diam:     2.10 cm   LV E/e' lateral: 8.8 LV SV:         113 LV SV Index:   43 LVOT Area:     3.46 cm  RIGHT VENTRICLE RV Basal diam:  4.10 cm RV Mid diam:    3.20 cm RV S prime:     17.20 cm/s TAPSE (M-mode): 2.5 cm LEFT ATRIUM             Index        RIGHT ATRIUM           Index LA diam:        4.10 cm 1.56 cm/m   RA Area:     22.90 cm LA Vol (A2C):   95.3 ml 36.21 ml/m  RA Volume:   65.70 ml  24.97 ml/m LA Vol (A4C):   92.8 ml 35.26 ml/m LA Biplane Vol: 94.1 ml 35.76 ml/m  AORTIC  VALVE AV Area (Vmax):    3.03 cm AV Area (Vmean):   3.17 cm AV Area (VTI):     2.90 cm AV Vmax:           166.00 cm/s AV Vmean:          116.000 cm/s AV VTI:            0.388 m AV Peak Grad:      11.0 mmHg AV Mean Grad:      6.0 mmHg LVOT Vmax:         145.00 cm/s LVOT Vmean:        106.000 cm/s LVOT VTI:          0.325 m LVOT/AV VTI ratio: 0.84  AORTA Ao Root diam: 4.10 cm MITRAL VALVE               TRICUSPID VALVE MV Area (PHT): 2.60 cm    TR Peak grad:   13.7 mmHg MV  Area VTI:   3.84 cm    TR Vmax:        185.00 cm/s MV Peak grad:  3.6 mmHg MV Mean grad:  1.0 mmHg    SHUNTS MV Vmax:       0.95 m/s    Systemic VTI:  0.32 m MV Vmean:      52.5 cm/s   Systemic Diam: 2.10 cm MV Decel Time: 292 msec MV E velocity: 69.20 cm/s MV A velocity: 91.70 cm/s MV E/A ratio:  0.75 Kathlyn Sacramento MD Electronically signed by Kathlyn Sacramento MD Signature Date/Time: 11/11/2022/5:31:55 PM    Final    US Venous Img Lower Bilateral (DVT)  Result Date: 11/10/2022 CLINICAL DATA:  Leg pain EXAM: BILATERAL LOWER EXTREMITY VENOUS DOPPLER ULTRASOUND TECHNIQUE: Gray-scale sonography with graded compression, as well as color Doppler and duplex ultrasound were performed to evaluate the lower extremity deep venous systems from the level of the common femoral vein and including the common femoral, femoral, profunda femoral, popliteal and calf veins including the posterior tibial, peroneal and gastrocnemius veins when visible. The superficial great saphenous vein was also interrogated. Spectral Doppler was utilized to evaluate flow at rest and with distal augmentation maneuvers in the common femoral, femoral and popliteal veins. COMPARISON:  None Available. FINDINGS: RIGHT LOWER EXTREMITY Common Femoral Vein: No evidence of thrombus. Normal compressibility, respiratory phasicity and response to augmentation. Saphenofemoral Junction: No evidence of thrombus. Normal compressibility and flow on color Doppler imaging. Profunda Femoral Vein: No evidence of thrombus. Normal compressibility and flow on color Doppler imaging. Femoral Vein: No evidence of thrombus. Normal compressibility, respiratory phasicity and response to augmentation. Popliteal Vein: No evidence of thrombus. Normal compressibility, respiratory phasicity and response to augmentation. Calf Veins: No evidence of thrombus. Normal compressibility and flow on color Doppler imaging. Superficial Great Saphenous Vein: No evidence of thrombus. Normal  compressibility. Venous Reflux:  None. Other Findings:  None. LEFT LOWER EXTREMITY Common Femoral Vein: No evidence of thrombus. Normal compressibility, respiratory phasicity and response to augmentation. Saphenofemoral Junction: No evidence of thrombus. Normal compressibility and flow on color Doppler imaging. Profunda Femoral Vein: No evidence of thrombus. Normal compressibility and flow on color Doppler imaging. Femoral Vein: No evidence of thrombus. Normal compressibility, respiratory phasicity and response to augmentation. Popliteal Vein: No evidence of thrombus. Normal compressibility, respiratory phasicity and response to augmentation. Calf Veins: No evidence of thrombus. Normal compressibility and flow on color Doppler imaging. Superficial Great Saphenous Vein: No evidence of thrombus. Normal compressibility. Venous Reflux:  None.  Other Findings:  None. IMPRESSION: No evidence of deep venous thrombosis in either lower extremity. Electronically Signed   By: Inez Catalina M.D.   On: 11/10/2022 11:32   CT Angio Chest PE W and/or Wo Contrast  Result Date: 11/09/2022 CLINICAL DATA:  Pulmonary embolism (PE) suspected, low to intermediate prob, positive D-dimer EXAM: CT ANGIOGRAPHY CHEST WITH CONTRAST TECHNIQUE: Multidetector CT imaging of the chest was performed using the standard protocol during bolus administration of intravenous contrast. Multiplanar CT image reconstructions and MIPs were obtained to evaluate the vascular anatomy. RADIATION DOSE REDUCTION: This exam was performed according to the departmental dose-optimization program which includes automated exposure control, adjustment of the mA and/or kV according to patient size and/or use of iterative reconstruction technique. CONTRAST:  141mL OMNIPAQUE IOHEXOL 350 MG/ML SOLN COMPARISON:  06/19/2022 FINDINGS: Cardiovascular: There is adequate opacification the pulmonary arterial tree. No intraluminal filling defect is identified through the segmental  level to suggest acute pulmonary embolus. The central pulmonary arteries are enlarged in keeping with changes of pulmonary arterial hypertension, similar to prior examination. Mild multi-vessel coronary artery calcification. Mild cardiomegaly, similar to prior examination with asymmetric left ventricular hypertrophy with thinning at the left ventricular apex suggesting ischemia or prior myocardial infarction in this location. No pericardial effusion. Mild atherosclerotic calcification within the thoracic aorta. No aortic aneurysm. Mediastinum/Nodes: No enlarged mediastinal, hilar, or axillary lymph nodes. Thyroid gland, trachea, and esophagus demonstrate no significant findings. Lungs/Pleura: Mild bibasilar atelectasis. No superimposed confluent pulmonary infiltrate. No pneumothorax or pleural effusion. Bronchial wall thickening is noted centrally in keeping with changes of airway inflammation. Upper Abdomen: No acute abnormality. Musculoskeletal: No chest wall abnormality. No acute or significant osseous findings. Review of the MIP images confirms the above findings. IMPRESSION: 1. No pulmonary embolus. 2. Morphologic changes in keeping with pulmonary arterial hypertension, similar to prior examination. 3. Mild multi-vessel coronary artery calcification. Mild cardiomegaly with asymmetric left ventricular hypertrophy with thinning at the left ventricular apex suggesting ischemia or prior myocardial infarction in this location. 4. Bronchial wall thickening centrally in keeping with changes of airway inflammation. Aortic Atherosclerosis (ICD10-I70.0). Electronically Signed   By: Fidela Salisbury M.D.   On: 11/09/2022 21:57   DG Chest 2 View  Result Date: 11/09/2022 CLINICAL DATA:  Shortness of breath and chest pain EXAM: CHEST - 2 VIEW COMPARISON:  03/14/2022 FINDINGS: Mild bronchitic changes. No acute airspace disease or pleural effusion. Scarring or atelectasis at the right base. Borderline cardiomegaly. No  pneumothorax IMPRESSION: Borderline cardiomegaly. Mild bronchitic changes with scarring or atelectasis at the right base Electronically Signed   By: Donavan Foil M.D.   On: 11/09/2022 18:05    Microbiology: Results for orders placed or performed during the hospital encounter of 06/19/22  Urine Culture     Status: Abnormal   Collection Time: 06/19/22  8:54 PM   Specimen: Urine, Clean Catch  Result Value Ref Range Status   Specimen Description   Final    URINE, CLEAN CATCH Performed at [facility], [address] [place], [place] [id number]    Special Requests   Final    NONE Performed at [facility], [address] [place], [place] [address]    Culture >=100,000 COLONIES/mL ESCHERICHIA COLI (A)  Final   Report Status 06/22/2022 FINAL  Final   Organism ID, Bacteria ESCHERICHIA COLI (A)  Final      Susceptibility   Escherichia coli - MIC*    AMPICILLIN <=2 SENSITIVE Sensitive     CEFAZOLIN <=4  SENSITIVE Sensitive     CEFEPIME <=0.12 SENSITIVE Sensitive     CEFTRIAXONE <=0.25 SENSITIVE Sensitive     CIPROFLOXACIN <=0.25 SENSITIVE Sensitive     GENTAMICIN <=1 SENSITIVE Sensitive     IMIPENEM <=0.25 SENSITIVE Sensitive     NITROFURANTOIN <=16 SENSITIVE Sensitive     TRIMETH/SULFA <=20 SENSITIVE Sensitive     AMPICILLIN/SULBACTAM <=2 SENSITIVE Sensitive     PIP/TAZO <=4 SENSITIVE Sensitive     * >=100,000 COLONIES/mL ESCHERICHIA COLI  Resp Panel by RT-PCR (Flu A&B, Covid) Anterior Nasal Swab     Status: None   Collection Time: 06/19/22  9:58 PM   Specimen: Anterior Nasal Swab  Result Value Ref Range Status   SARS Coronavirus 2 by RT PCR NEGATIVE NEGATIVE Final    Comment: (NOTE) SARS-CoV-2 target nucleic acids are NOT DETECTED.  The SARS-CoV-2 RNA is generally detectable in upper respiratory specimens during the acute phase of infection. The lowest concentration of SARS-CoV-2 viral copies this assay can detect is 138 copies/mL. A negative result does not  preclude SARS-Cov-2 infection and should not be used as the sole basis for treatment or other patient management decisions. A negative result may occur with  improper specimen collection/handling, submission of specimen other than nasopharyngeal swab, presence of viral mutation(s) within the areas targeted by this assay, and inadequate number of viral copies(<138 copies/mL). A negative result must be combined with clinical observations, patient history, and epidemiological information. The expected result is Negative.  Fact Sheet for Patients:  EntrepreneurPulse.com.au  Fact Sheet for Healthcare Providers:  IncredibleEmployment.be  This test is no t yet approved or cleared by the [place] FDA and  has been authorized for detection and/or diagnosis of SARS-CoV-2 by FDA under an Emergency Use Authorization (EUA). This EUA will remain  in effect (meaning this test can be used) for the duration of the COVID-19 declaration under Section 564(b)(1) of the Act, 21 U.S.C.section 360bbb-3(b)(1), unless the authorization is terminated  or revoked sooner.       Influenza A by PCR NEGATIVE NEGATIVE Final   Influenza B by PCR NEGATIVE NEGATIVE Final    Comment: (NOTE) The Xpert Xpress SARS-CoV-2/FLU/RSV plus assay is intended as an aid in the diagnosis of influenza from Nasopharyngeal swab specimens and should not be used as a sole basis for treatment. Nasal washings and aspirates are unacceptable for Xpert Xpress SARS-CoV-2/FLU/RSV testing.  Fact Sheet for Patients: EntrepreneurPulse.com.au  Fact Sheet for Healthcare Providers: IncredibleEmployment.be  This test is not yet approved or cleared by the [place] FDA and has been authorized for detection and/or diagnosis of SARS-CoV-2 by FDA under an Emergency Use Authorization (EUA). This EUA will remain in effect (meaning this test can be used) for the  duration of the COVID-19 declaration under Section 564(b)(1) of the Act, 21 U.S.C. section 360bbb-3(b)(1), unless the authorization is terminated or revoked.  Performed at [facility], [place], [place] [address]   Respiratory (~20 pathogens) panel by PCR     Status: None   Collection Time: 06/20/22  2:41 PM   Specimen: Nasopharyngeal Swab; Respiratory  Result Value Ref Range Status   Adenovirus NOT DETECTED NOT DETECTED Final   Coronavirus 229E NOT DETECTED NOT DETECTED Final    Comment: (NOTE) The Coronavirus on the Respiratory Panel, DOES NOT test for the novel  Coronavirus (2019 nCoV)    Coronavirus HKU1 NOT DETECTED NOT DETECTED Final   Coronavirus NL63 NOT DETECTED NOT DETECTED Final   Coronavirus OC43 NOT  DETECTED NOT DETECTED Final   Metapneumovirus NOT DETECTED NOT DETECTED Final   Rhinovirus / Enterovirus NOT DETECTED NOT DETECTED Final   Influenza A NOT DETECTED NOT DETECTED Final   Influenza B NOT DETECTED NOT DETECTED Final   Parainfluenza Virus 1 NOT DETECTED NOT DETECTED Final   Parainfluenza Virus 2 NOT DETECTED NOT DETECTED Final   Parainfluenza Virus 3 NOT DETECTED NOT DETECTED Final   Parainfluenza Virus 4 NOT DETECTED NOT DETECTED Final   Respiratory Syncytial Virus NOT DETECTED NOT DETECTED Final   Bordetella pertussis NOT DETECTED NOT DETECTED Final   Bordetella Parapertussis NOT DETECTED NOT DETECTED Final   Chlamydophila pneumoniae NOT DETECTED NOT DETECTED Final   Mycoplasma pneumoniae NOT DETECTED NOT DETECTED Final    Comment: Performed at [facility], [place] [address], [place], [place] [address]    Labs: CBC: Recent Labs  Lab 11/10/22 0036 11/12/22 0540 11/13/22 0439 11/14/22 0455 11/15/22 0435  WBC 9.0 9.0 9.4 8.6 9.1  NEUTROABS  --  8.2* 7.0  --   --   HGB 13.2 12.7* 12.7* 13.1 12.7*  HCT 42.9 41.2 41.4 42.4 40.5  MCV 94.3 92.8 92.8 91.6 92.0  PLT 244 229 235 235 [id number]   Basic Metabolic Panel: Recent Labs   Lab 11/10/22 0036 11/12/22 0540 11/13/22 0439 11/14/22 0455 11/15/22 0435  NA 134* 134* 136 133* 137  K 3.6 4.3 3.8 3.4* 4.2  CL 90* 87* 91* 88* 88*  CO2 41* 38* 37* 37* 38*  GLUCOSE 214* 322* 306* 198* 206*  BUN 8 19 29* 30* 31*  CREATININE 0.83 1.00 1.08 1.03 1.00  CALCIUM 8.3* 8.7* 8.8* 8.5* 9.2  MG  --   --   --  2.0 2.3  PHOS  --   --   --  3.5 3.2   Liver Function Tests: Recent Labs  Lab 11/14/22 0455 11/15/22 0435  AST 15 16  ALT 12 14  ALKPHOS 54 51  BILITOT 0.9 0.7  PROT 6.9 6.6  ALBUMIN 3.6 3.4*   CBG: Recent Labs  Lab 11/13/22 2121 11/14/22 0820 11/14/22 1207 11/14/22 1720 11/14/22 2059  GLUCAP 225* 166* 192* 142* 335*    Discharge time spent: greater than 30 minutes.  Signed: Lucienne Minks , MD [facility] 11/15/2022

## 2022-11-21 ENCOUNTER — Telehealth: Payer: Self-pay | Admitting: Family

## 2022-11-21 ENCOUNTER — Encounter: Payer: 59 | Admitting: Family

## 2022-11-21 NOTE — Progress Notes (Deleted)
   Patient ID: Cody Alexander, male    DOB: [birth year], 67 y.o.   MRN: [id number]  HPI  [name] is a 67 y/o male with a history of  Echo 11/11/22: EF 65-70% along with moderate LVH, mild/ moderate LAE and mild RAE and mild dilatation of aortic root (41 mm)  LHC 02/27/18: Prox LAD lesion is 35% stenosed. 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 Noncardiac chest pain.  Admitted 11/09/22 due to acute on chronic HF after being out of diuretics for ~ 2 weeks. Diuresed with IV lasix. CTA negative for PE. Admitted 10/20/22 due to SOB, chest pain and weight gain after questionable medication adherence to diuretic regimen. IV diuresed and transitioned to oral bumex.    He presents today for his initial visit with a chief complaint of   Review of Systems    Physical Exam    Assessment & Plan:  1: Ischemic cardiomyopathy with preserved ejection fraction- - NYHA class  - echo 11/11/22: EF 65-70% along with moderate LVH, mild/ moderate LAE and mild RAE and mild dilatation of aortic root (41 mm) - BNP 11/09/22 was 82.7  2: HTN- - BP  - BMP 11/15/22 showed sodium 137, potassium 4.2, creatinine 1.00 & GFR >60  3: DM- - A1c 11/10/22 was 11.2%  4: COPD-  5: CAD- - LHC 02/27/18: Prox LAD lesion is 35% stenosed. 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 Noncardiac chest pain. - troponin 11/10/22 was 99

## 2022-11-21 NOTE — Telephone Encounter (Signed)
Patient did not show for his initial [facility] appointment on 11/21/22. This is the 5th appt that he has missed.

## 2023-01-05 ENCOUNTER — Other Ambulatory Visit: Payer: Self-pay

## 2023-01-05 ENCOUNTER — Emergency Department: Payer: 59

## 2023-01-05 ENCOUNTER — Inpatient Hospital Stay
Admission: EM | Admit: 2023-01-05 | Discharge: 2023-01-21 | DRG: 280 | Disposition: A | Discharge status: Home Health | Payer: 59 | Attending: Obstetrics and Gynecology | Admitting: Obstetrics and Gynecology

## 2023-01-05 ENCOUNTER — Inpatient Hospital Stay: Payer: 59

## 2023-01-05 ENCOUNTER — Encounter: Payer: Self-pay | Admitting: Emergency Medicine

## 2023-01-05 DIAGNOSIS — Y95 Nosocomial condition: Secondary | ICD-10-CM | POA: Diagnosis not present

## 2023-01-05 DIAGNOSIS — Z7982 Long term (current) use of aspirin: Secondary | ICD-10-CM

## 2023-01-05 DIAGNOSIS — J9622 Acute and chronic respiratory failure with hypercapnia: Secondary | ICD-10-CM | POA: Diagnosis not present

## 2023-01-05 DIAGNOSIS — I77810 Thoracic aortic ectasia: Secondary | ICD-10-CM | POA: Diagnosis present

## 2023-01-05 DIAGNOSIS — Z888 Allergy status to other drugs, medicaments and biological substances status: Secondary | ICD-10-CM

## 2023-01-05 DIAGNOSIS — G4733 Obstructive sleep apnea (adult) (pediatric): Secondary | ICD-10-CM | POA: Diagnosis present

## 2023-01-05 DIAGNOSIS — Z7951 Long term (current) use of inhaled steroids: Secondary | ICD-10-CM

## 2023-01-05 DIAGNOSIS — I214 Non-ST elevation (NSTEMI) myocardial infarction: Secondary | ICD-10-CM | POA: Diagnosis not present

## 2023-01-05 DIAGNOSIS — R570 Cardiogenic shock: Secondary | ICD-10-CM | POA: Diagnosis not present

## 2023-01-05 DIAGNOSIS — J441 Chronic obstructive pulmonary disease with (acute) exacerbation: Secondary | ICD-10-CM | POA: Diagnosis not present

## 2023-01-05 DIAGNOSIS — Z955 Presence of coronary angioplasty implant and graft: Secondary | ICD-10-CM

## 2023-01-05 DIAGNOSIS — Z6841 Body Mass Index (BMI) 40.0 and over, adult: Secondary | ICD-10-CM | POA: Diagnosis not present

## 2023-01-05 DIAGNOSIS — Z91148 Patient's other noncompliance with medication regimen for other reason: Secondary | ICD-10-CM

## 2023-01-05 DIAGNOSIS — I110 Hypertensive heart disease with heart failure: Secondary | ICD-10-CM | POA: Diagnosis present

## 2023-01-05 DIAGNOSIS — N179 Acute kidney failure, unspecified: Secondary | ICD-10-CM | POA: Diagnosis not present

## 2023-01-05 DIAGNOSIS — G629 Polyneuropathy, unspecified: Secondary | ICD-10-CM | POA: Diagnosis present

## 2023-01-05 DIAGNOSIS — B9620 Unspecified Escherichia coli [E. coli] as the cause of diseases classified elsewhere: Secondary | ICD-10-CM | POA: Diagnosis present

## 2023-01-05 DIAGNOSIS — J440 Chronic obstructive pulmonary disease with acute lower respiratory infection: Secondary | ICD-10-CM | POA: Diagnosis not present

## 2023-01-05 DIAGNOSIS — N401 Enlarged prostate with lower urinary tract symptoms: Secondary | ICD-10-CM | POA: Diagnosis present

## 2023-01-05 DIAGNOSIS — Z87891 Personal history of nicotine dependence: Secondary | ICD-10-CM

## 2023-01-05 DIAGNOSIS — E7220 Disorder of urea cycle metabolism, unspecified: Secondary | ICD-10-CM | POA: Diagnosis present

## 2023-01-05 DIAGNOSIS — E66813 Obesity, class 3: Secondary | ICD-10-CM | POA: Diagnosis present

## 2023-01-05 DIAGNOSIS — I2510 Atherosclerotic heart disease of native coronary artery without angina pectoris: Secondary | ICD-10-CM | POA: Diagnosis present

## 2023-01-05 DIAGNOSIS — J449 Chronic obstructive pulmonary disease, unspecified: Secondary | ICD-10-CM | POA: Diagnosis present

## 2023-01-05 DIAGNOSIS — E870 Hyperosmolality and hypernatremia: Secondary | ICD-10-CM | POA: Diagnosis not present

## 2023-01-05 DIAGNOSIS — E1165 Type 2 diabetes mellitus with hyperglycemia: Secondary | ICD-10-CM

## 2023-01-05 DIAGNOSIS — E669 Obesity, unspecified: Secondary | ICD-10-CM | POA: Diagnosis present

## 2023-01-05 DIAGNOSIS — Z823 Family history of stroke: Secondary | ICD-10-CM

## 2023-01-05 DIAGNOSIS — J189 Pneumonia, unspecified organism: Secondary | ICD-10-CM | POA: Diagnosis not present

## 2023-01-05 DIAGNOSIS — I5033 Acute on chronic diastolic (congestive) heart failure: Secondary | ICD-10-CM | POA: Diagnosis present

## 2023-01-05 DIAGNOSIS — Z9981 Dependence on supplemental oxygen: Secondary | ICD-10-CM

## 2023-01-05 DIAGNOSIS — R079 Chest pain, unspecified: Secondary | ICD-10-CM | POA: Diagnosis not present

## 2023-01-05 DIAGNOSIS — Z7984 Long term (current) use of oral hypoglycemic drugs: Secondary | ICD-10-CM

## 2023-01-05 DIAGNOSIS — N390 Urinary tract infection, site not specified: Secondary | ICD-10-CM | POA: Diagnosis present

## 2023-01-05 DIAGNOSIS — J9621 Acute and chronic respiratory failure with hypoxia: Secondary | ICD-10-CM | POA: Diagnosis present

## 2023-01-05 DIAGNOSIS — E873 Alkalosis: Secondary | ICD-10-CM | POA: Diagnosis not present

## 2023-01-05 DIAGNOSIS — I10 Essential (primary) hypertension: Secondary | ICD-10-CM | POA: Diagnosis present

## 2023-01-05 DIAGNOSIS — Z7189 Other specified counseling: Secondary | ICD-10-CM | POA: Diagnosis not present

## 2023-01-05 DIAGNOSIS — E1140 Type 2 diabetes mellitus with diabetic neuropathy, unspecified: Secondary | ICD-10-CM | POA: Diagnosis present

## 2023-01-05 DIAGNOSIS — Z8249 Family history of ischemic heart disease and other diseases of the circulatory system: Secondary | ICD-10-CM

## 2023-01-05 DIAGNOSIS — Z515 Encounter for palliative care: Secondary | ICD-10-CM | POA: Diagnosis not present

## 2023-01-05 DIAGNOSIS — G253 Myoclonus: Secondary | ICD-10-CM | POA: Diagnosis not present

## 2023-01-05 DIAGNOSIS — Z794 Long term (current) use of insulin: Secondary | ICD-10-CM

## 2023-01-05 DIAGNOSIS — Z1152 Encounter for screening for COVID-19: Secondary | ICD-10-CM

## 2023-01-05 DIAGNOSIS — Z66 Do not resuscitate: Secondary | ICD-10-CM | POA: Diagnosis present

## 2023-01-05 DIAGNOSIS — E876 Hypokalemia: Secondary | ICD-10-CM | POA: Insufficient documentation

## 2023-01-05 DIAGNOSIS — R0603 Acute respiratory distress: Secondary | ICD-10-CM | POA: Diagnosis present

## 2023-01-05 DIAGNOSIS — R1310 Dysphagia, unspecified: Secondary | ICD-10-CM | POA: Diagnosis not present

## 2023-01-05 DIAGNOSIS — R4182 Altered mental status, unspecified: Secondary | ICD-10-CM | POA: Diagnosis not present

## 2023-01-05 DIAGNOSIS — R3911 Hesitancy of micturition: Secondary | ICD-10-CM | POA: Diagnosis not present

## 2023-01-05 DIAGNOSIS — G9341 Metabolic encephalopathy: Secondary | ICD-10-CM | POA: Diagnosis not present

## 2023-01-05 DIAGNOSIS — I21A1 Myocardial infarction type 2: Secondary | ICD-10-CM | POA: Diagnosis present

## 2023-01-05 DIAGNOSIS — N3000 Acute cystitis without hematuria: Secondary | ICD-10-CM | POA: Diagnosis not present

## 2023-01-05 DIAGNOSIS — R569 Unspecified convulsions: Secondary | ICD-10-CM | POA: Diagnosis not present

## 2023-01-05 DIAGNOSIS — I509 Heart failure, unspecified: Secondary | ICD-10-CM

## 2023-01-05 DIAGNOSIS — Z79899 Other long term (current) drug therapy: Secondary | ICD-10-CM

## 2023-01-05 DIAGNOSIS — Z87442 Personal history of urinary calculi: Secondary | ICD-10-CM | POA: Insufficient documentation

## 2023-01-05 LAB — URINALYSIS, ROUTINE W REFLEX MICROSCOPIC
Bilirubin Urine: NEGATIVE
Glucose, UA: 500 mg/dL — AB
Nitrite: NEGATIVE
Protein, ur: >300 mg/dL — AB
RBC / HPF: >50 RBC/hpf (ref 0–5)
Specific Gravity, Urine: 1.02 (ref 1.005–1.030)
WBC, UA: >50 WBC/hpf (ref 0–5)
pH: 5.5 (ref 5.0–8.0)

## 2023-01-05 LAB — CBC WITH DIFFERENTIAL/PLATELET
Abs Immature Granulocytes: 0.12 10*3/uL — ABNORMAL HIGH (ref 0.00–0.07)
Basophils Absolute: 0.1 10*3/uL (ref 0.0–0.1)
Basophils Relative: 0 %
Eosinophils Absolute: 0 10*3/uL (ref 0.0–0.5)
Eosinophils Relative: 0 %
HCT: 47.5 % (ref 39.0–52.0)
Hemoglobin: 15.1 g/dL (ref 13.0–17.0)
Immature Granulocytes: 1 %
Lymphocytes Relative: 2 %
Lymphs Abs: 0.5 10*3/uL — ABNORMAL LOW (ref 0.7–4.0)
MCH: 28.5 pg (ref 26.0–34.0)
MCHC: 31.8 g/dL (ref 30.0–36.0)
MCV: 89.8 fL (ref 80.0–100.0)
Monocytes Absolute: 1.6 10*3/uL — ABNORMAL HIGH (ref 0.1–1.0)
Monocytes Relative: 7 %
Neutro Abs: 20.6 10*3/uL — ABNORMAL HIGH (ref 1.7–7.7)
Neutrophils Relative %: 90 %
Platelets: 233 10*3/uL (ref 150–400)
RBC: 5.29 MIL/uL (ref 4.22–5.81)
RDW: 13.3 % (ref 11.5–15.5)
WBC: 22.9 10*3/uL — ABNORMAL HIGH (ref 4.0–10.5)
nRBC: 0 % (ref 0.0–0.2)

## 2023-01-05 LAB — CBG MONITORING, ED: Glucose-Capillary: 276 mg/dL — ABNORMAL HIGH (ref 70–99)

## 2023-01-05 LAB — BASIC METABOLIC PANEL
Anion gap: 13 (ref 5–15)
BUN: 19 mg/dL (ref 8–23)
CO2: 31 mmol/L (ref 22–32)
Calcium: 8.9 mg/dL (ref 8.9–10.3)
Chloride: 95 mmol/L — ABNORMAL LOW (ref 98–111)
Creatinine, Ser: 1.14 mg/dL (ref 0.61–1.24)
GFR, Estimated: >60 mL/min (ref >60)
Glucose, Bld: 261 mg/dL — ABNORMAL HIGH (ref 70–99)
Potassium: 3.3 mmol/L — ABNORMAL LOW (ref 3.5–5.1)
Sodium: 139 mmol/L (ref 135–145)

## 2023-01-05 LAB — TROPONIN I (HIGH SENSITIVITY)
Troponin I (High Sensitivity): 209 ng/L (ref <18)
Troponin I (High Sensitivity): 241 ng/L (ref <18)

## 2023-01-05 LAB — SARS CORONAVIRUS 2 BY RT PCR: SARS Coronavirus 2 by RT PCR: NEGATIVE

## 2023-01-05 LAB — BRAIN NATRIURETIC PEPTIDE: B Natriuretic Peptide: 272.1 pg/mL — ABNORMAL HIGH (ref 0.0–100.0)

## 2023-01-05 LAB — TSH: TSH: 0.389 u[IU]/mL (ref 0.350–4.500)

## 2023-01-05 MED ORDER — SODIUM CHLORIDE 0.9 % IV SOLN
2.0000 g | Freq: Once | INTRAVENOUS | Status: AC
  Administered 2023-01-05: 2 g via INTRAVENOUS
  Filled 2023-01-05: qty 12.5

## 2023-01-05 MED ORDER — LISINOPRIL 5 MG PO TABS
20.0000 mg | ORAL_TABLET | Freq: Every day | ORAL | Status: DC
  Administered 2023-01-06 – 2023-01-07 (×2): 20 mg via ORAL
  Filled 2023-01-05 (×2): qty 2

## 2023-01-05 MED ORDER — ASPIRIN 81 MG PO CHEW
324.0000 mg | CHEWABLE_TABLET | Freq: Once | ORAL | Status: AC
  Administered 2023-01-05: 324 mg via ORAL
  Filled 2023-01-05: qty 4

## 2023-01-05 MED ORDER — ALBUTEROL SULFATE (2.5 MG/3ML) 0.083% IN NEBU: 2.5000 mg | INHALATION_SOLUTION | RESPIRATORY_TRACT | Status: DC | PRN

## 2023-01-05 MED ORDER — FENTANYL CITRATE PF 50 MCG/ML IJ SOSY
50.0000 ug | PREFILLED_SYRINGE | Freq: Once | INTRAMUSCULAR | Status: AC
  Administered 2023-01-05: 50 ug via INTRAVENOUS
  Filled 2023-01-05: qty 1

## 2023-01-05 MED ORDER — ATORVASTATIN CALCIUM 20 MG PO TABS
40.0000 mg | ORAL_TABLET | Freq: Every day | ORAL | Status: DC
  Administered 2023-01-07: 40 mg via ORAL
  Filled 2023-01-05 (×2): qty 2

## 2023-01-05 MED ORDER — METOPROLOL TARTRATE 25 MG PO TABS
25.0000 mg | ORAL_TABLET | Freq: Two times a day (BID) | ORAL | Status: DC
  Administered 2023-01-06 – 2023-01-07 (×4): 25 mg via ORAL
  Filled 2023-01-05 (×5): qty 1

## 2023-01-05 MED ORDER — NITROGLYCERIN 0.4 MG SL SUBL
0.4000 mg | SUBLINGUAL_TABLET | SUBLINGUAL | Status: DC | PRN
  Filled 2023-01-05: qty 1

## 2023-01-05 MED ORDER — HEPARIN (PORCINE) 25000 UT/250ML-% IV SOLN
2200.0000 [IU]/h | INTRAVENOUS | Status: DC
  Administered 2023-01-05: 1300 [IU]/h via INTRAVENOUS
  Administered 2023-01-06: 1900 [IU]/h via INTRAVENOUS
  Administered 2023-01-07: 2200 [IU]/h via INTRAVENOUS
  Filled 2023-01-05 (×3): qty 250

## 2023-01-05 MED ORDER — HEPARIN BOLUS VIA INFUSION
4000.0000 [IU] | Freq: Once | INTRAVENOUS | Status: AC
  Administered 2023-01-05: 4000 [IU] via INTRAVENOUS
  Filled 2023-01-05: qty 4000

## 2023-01-05 MED ORDER — ASPIRIN 81 MG PO TBEC
81.0000 mg | DELAYED_RELEASE_TABLET | Freq: Every day | ORAL | Status: DC
  Administered 2023-01-06 – 2023-01-07 (×2): 81 mg via ORAL
  Filled 2023-01-05 (×2): qty 1

## 2023-01-05 MED ORDER — FUROSEMIDE 10 MG/ML IJ SOLN
40.0000 mg | Freq: Two times a day (BID) | INTRAMUSCULAR | Status: DC
  Administered 2023-01-05 – 2023-01-08 (×6): 40 mg via INTRAVENOUS
  Filled 2023-01-05 (×6): qty 4

## 2023-01-05 MED ORDER — POTASSIUM CHLORIDE CRYS ER 20 MEQ PO TBCR
10.0000 meq | EXTENDED_RELEASE_TABLET | Freq: Every day | ORAL | Status: DC
  Administered 2023-01-07: 10 meq via ORAL
  Filled 2023-01-05 (×2): qty 1

## 2023-01-05 MED ORDER — SPIRONOLACTONE 12.5 MG HALF TABLET
12.5000 mg | ORAL_TABLET | Freq: Every day | ORAL | Status: DC
  Administered 2023-01-07: 12.5 mg via ORAL
  Filled 2023-01-05 (×4): qty 1

## 2023-01-05 MED ORDER — IOHEXOL 350 MG/ML SOLN
75.0000 mL | Freq: Once | INTRAVENOUS | Status: AC | PRN
  Administered 2023-01-05: 75 mL via INTRAVENOUS

## 2023-01-05 MED ORDER — ONDANSETRON HCL 4 MG/2ML IJ SOLN
4.0000 mg | Freq: Four times a day (QID) | INTRAMUSCULAR | Status: DC | PRN
  Administered 2023-01-07: 4 mg via INTRAVENOUS
  Filled 2023-01-05: qty 2

## 2023-01-05 MED ORDER — INSULIN ASPART 100 UNIT/ML IJ SOLN
0.0000 [IU] | Freq: Every day | INTRAMUSCULAR | Status: DC
  Administered 2023-01-05: 3 [IU] via SUBCUTANEOUS
  Filled 2023-01-05: qty 1

## 2023-01-05 MED ORDER — INSULIN ASPART 100 UNIT/ML IJ SOLN
0.0000 [IU] | Freq: Three times a day (TID) | INTRAMUSCULAR | Status: DC
  Administered 2023-01-06 (×3): 3 [IU] via SUBCUTANEOUS
  Administered 2023-01-07 (×2): 2 [IU] via SUBCUTANEOUS
  Administered 2023-01-07: 3 [IU] via SUBCUTANEOUS
  Administered 2023-01-08: 5 [IU] via SUBCUTANEOUS
  Filled 2023-01-05 (×7): qty 1

## 2023-01-05 MED ORDER — GABAPENTIN 300 MG PO CAPS
600.0000 mg | ORAL_CAPSULE | Freq: Three times a day (TID) | ORAL | Status: DC
  Administered 2023-01-07 – 2023-01-08 (×4): 600 mg via ORAL
  Filled 2023-01-05 (×8): qty 2

## 2023-01-05 MED ORDER — INSULIN DETEMIR 100 UNIT/ML ~~LOC~~ SOLN
30.0000 [IU] | Freq: Every day | SUBCUTANEOUS | Status: DC
  Administered 2023-01-05 – 2023-01-07 (×2): 30 [IU] via SUBCUTANEOUS
  Filled 2023-01-05 (×4): qty 0.3

## 2023-01-05 MED ORDER — ISOSORBIDE DINITRATE 30 MG PO TABS
30.0000 mg | ORAL_TABLET | Freq: Every morning | ORAL | Status: DC
  Administered 2023-01-07: 30 mg via ORAL
  Filled 2023-01-05 (×3): qty 1

## 2023-01-05 MED ORDER — ACETAMINOPHEN 325 MG PO TABS
650.0000 mg | ORAL_TABLET | ORAL | Status: DC | PRN
  Administered 2023-01-06 – 2023-01-21 (×6): 650 mg via ORAL
  Filled 2023-01-05 (×6): qty 2

## 2023-01-05 NOTE — Assessment & Plan Note (Signed)
Acute exacerbation not suspected Continue home Symbicort/formulary substitution DuoNebs as needed

## 2023-01-05 NOTE — ED Notes (Signed)
Pt states he only takes his medications in the morning and does not want any medications tonight.

## 2023-01-05 NOTE — Assessment & Plan Note (Signed)
Acute on chronic respiratory failure with hypoxia Patient presents in respiratory distress requiring NRB at 15 L for transport transitioning to BiPAP on arrival No florid CHF on chest x-ray but BNP elevated at 272 Continue IV Lasix Continue spironolactone, metoprolol and ACE/ARB Daily weights with intake and output monitoring Follow-up repeat echo.  Last EF 65% with G1 DD 06/2022 Cardiology consult

## 2023-01-05 NOTE — Assessment & Plan Note (Addendum)
Continue home basal insulin Sliding scale insulin coverage

## 2023-01-05 NOTE — Assessment & Plan Note (Addendum)
History of BPH with LUTS Urinalysis with moderate leukocyte esterase and WBC 22,000 CT renal stone study negative for obstructive uropathy Patient did report urinary retention and hesitancy  Continue cefepime started in the emergency room

## 2023-01-05 NOTE — Assessment & Plan Note (Signed)
-   Continue home meds °

## 2023-01-05 NOTE — Progress Notes (Signed)
Transport to and from CT on bipap doc settings completed w/o incident. Pt now on 4 lpm [place] w/ bipap standby.

## 2023-01-05 NOTE — Assessment & Plan Note (Signed)
Currently on BiPAP 

## 2023-01-05 NOTE — Assessment & Plan Note (Signed)
Complicating factor to overall prognosis and care 

## 2023-01-05 NOTE — ED Notes (Signed)
Pt given sandwhich 

## 2023-01-05 NOTE — ED Triage Notes (Signed)
Pt via EMS from home. Pt called out due to difficulty breathing. Pt normally wear 4L [place] chronically states he hasn't been wearing his O2 like he is supposed to do. Pt has a hx of COPD/CHF. EMS reports diaphoresis and an initial O2 sat of 86% on chronic 4L. On arrival pt is diaphoretic and tachypneic. Pt placed on NRB at 15L stating 100%.  Pt states he also has not urinated since last night and he has been trying to go for the past couple of hours. Pt is A&Ox4 but respiratory distress.

## 2023-01-05 NOTE — H&P (Signed)
History and Physical    Patient: Cody Alexander [id number] DOB: 1956/08/13 DOA: 01/05/2023 DOS: the patient was seen and examined on 01/05/2023 PCP: [facility]  Patient coming from: Home  Chief Complaint:  Chief Complaint  Patient presents with   Respiratory Distress    HPI: Cody Alexander is a 67 y.o. 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 O2 sats with EMS on 4L was 86% and patient was placed on NRB at 15L for transport transitioning to Bipap on arrival.  ED course and data review: On arrival tachycardic to 133 and tachypneic to 36, O2 sat 98% on BiPAP.  BP 155/119. Labs: Troponin 209-241 and BNP 272.  WBC 22,000.  Glucose 261, potassium 3.3, urinalysis with moderate leukocyte esterase. EKG, personally viewed and interpreted showing sinus tachycardia at 114 with nonspecific ST-T wave changes. CTA chest showing no evidence of PE and no acute pulmonary parenchymal findings. CT renal stone study negative for obstructive uropathy.  Small amount of gas in the bladder presumably related to procedure and small bladder diverticula Chest x-ray cardiomegaly and mild interstitial edema. Patient treated with chewable aspirin 324 started on cefepime for UTI, given a dose of Lasix 40 mg IV and started on a heparin infusion for NSTEMI. Hospitalist consulted for admission. Review of Systems: As mentioned in the history of present illness. All other systems reviewed and are negative.  Past Medical History:  Diagnosis Date   CHF (congestive heart failure) (HCC)    COPD (chronic obstructive pulmonary disease) (HCC)    Diabetes mellitus without complication (HCC)    Hypertension     Neuropathy    Past Surgical History:  Procedure Laterality Date   LEFT HEART CATH AND CORONARY ANGIOGRAPHY Right 02/27/2018   Procedure: LEFT HEART CATH AND CORONARY ANGIOGRAPHY;  Surgeon: Laurier Nancy, MD;  Location: [facility];  Service: Cardiovascular;  Laterality: Right;   Social History:  reports that he has quit smoking. His smoking use included cigarettes. He has never used smokeless tobacco. He reports that he does not drink alcohol and does not use drugs.  Allergies  Allergen Reactions   Erythromycin Anaphylaxis and Swelling    Had eye swelling with erythromycin during a time when he had perf ear drum.  Tolerated azithromycin.    Family History  Problem Relation Age of Onset   Stroke Mother    Hypertension Father     Prior to Admission medications   Medication Sig Start Date End Date Taking? Authorizing Provider  albuterol (PROVENTIL) (2.5 MG/3ML) 0.083% nebulizer solution Take 2.5 mg by nebulization every 4 (four) hours as needed. 08/01/22 08/01/23  [provider]  atorvastatin (LIPITOR) 40 MG tablet Take 40 mg by mouth daily.    [provider]  budesonide-formoterol (SYMBICORT) 160-4.5 MCG/ACT inhaler Inhale 2 puffs into the lungs 2 (two) times daily.    [provider]  diclofenac Sodium (VOLTAREN) 1 % GEL Apply 2 g topically in the morning and at bedtime. 08/01/22   [provider]  docusate sodium (COLACE) 100 MG capsule Take 1 capsule (100 mg total) by mouth 2 (two) times daily. 11/15/22   Baron Hamper, MD  furosemide (LASIX) 40 MG tablet Take  1 tablet (40 mg total) by mouth 2 (two) times daily. 11/15/22 12/15/22  Baron Hamper, MD  gabapentin (NEURONTIN) 600 MG tablet Take 600 mg by mouth 3 (three) times daily. 04/18/22   [provider]  hydrOXYzine (ATARAX) 10 MG tablet Take 1 tablet (10 mg total) by mouth 3 (three) times daily as needed. Home med. 06/25/22   [name], MD  insulin detemir (LEVEMIR) 100 UNIT/ML  FlexPen Inject 30 Units into the skin in the morning and at bedtime. 06/25/22   [name], MD  insulin lispro (HUMALOG) 100 UNIT/ML KwikPen Inject 10 Units into the skin 3 (three) times daily with meals.    [provider]  ipratropium-albuterol (DUONEB) 0.5-2.5 (3) MG/3ML SOLN Take 3 mLs by nebulization every 6 (six) hours as needed. 06/25/22   [name], MD  isosorbide dinitrate (ISORDIL) 30 MG tablet Take 30 mg by mouth every morning. 11/14/20   [provider]  KLOR-CON M10 10 MEQ tablet Take 10 mEq by mouth daily. 10/24/22   [provider]  lisinopril (ZESTRIL) 20 MG tablet Take 20 mg by mouth daily. Patient not taking: Reported on 11/11/2022    [provider]  magnesium oxide (MAG-OX) 400 MG tablet Take 1 tablet by mouth 2 (two) times daily. 10/24/22   [provider]  metFORMIN (GLUCOPHAGE) 1000 MG tablet Take 1,000 mg by mouth 2 (two) times daily. 06/02/20   [provider]  metoprolol tartrate (LOPRESSOR) 25 MG tablet Take 25 mg by mouth 2 (two) times daily. Patient not taking: Reported on 06/19/2022    [provider]  PROAIR HFA 108 [phone] Base) MCG/ACT inhaler Inhale 2 puffs into the lungs every 4 (four) hours as needed for wheezing. 08/18/21   Alford Highland, MD  Selenium Sulfide 2.25 % SHAM SMARTSIG:Liberally Topical Daily 10/28/22   [provider]  spironolactone (ALDACTONE) 25 MG tablet Take 12.5 mg by mouth daily. Patient not taking: Reported on 06/19/2022    [provider]  traMADol (ULTRAM) 50 MG tablet Take 50 mg by mouth 2 (two) times daily as needed. 05/10/22   [provider]  triamcinolone cream (KENALOG) 0.1 % Apply 1 Application topically 2 (two) times daily. 10/25/22   [provider]    Physical Exam: Vitals:   01/05/23 1909 01/05/23 1930 01/05/23 1944 01/05/23 2000  BP:  (!) 146/93  (!) 111/98  Pulse:  (!) 108  (!) 105  Resp:  20  20  Temp:   98.5 F (36.9 C)   TempSrc:    Oral   SpO2: 95% 96%  93%  Weight:      Height:       Physical Exam Vitals and nursing note reviewed.  Constitutional:      General: He is not in acute distress. HENT:     Head: Normocephalic and atraumatic.  Cardiovascular:     Rate and Rhythm: Regular rhythm. Tachycardia present.     Heart sounds: Normal heart sounds.  Pulmonary:     Effort: Pulmonary effort is normal.     Breath sounds: Normal breath sounds.  Abdominal:     Palpations: Abdomen is soft.     Tenderness: There is no abdominal tenderness.  Neurological:     Mental Status: Mental status is at baseline.     Labs on Admission: I have personally reviewed following labs and imaging studies  CBC: Recent Labs  Lab 01/05/23 1535  WBC 22.9*  NEUTROABS 20.6*  HGB 15.1  HCT 47.5  MCV 89.8  PLT 233   Basic Metabolic Panel: Recent Labs  Lab 01/05/23 1535  NA 139  K 3.3*  CL 95*  CO2 31  GLUCOSE 261*  BUN 19  CREATININE 1.14  CALCIUM 8.9   GFR: Estimated Creatinine Clearance: 93.6 mL/min (by C-G formula based on SCr of 1.14 mg/dL). Liver Function Tests: No results for input(s): "AST", "ALT", "ALKPHOS", "BILITOT", "PROT", "ALBUMIN" in the last 168 hours. No results for input(s): "LIPASE", "AMYLASE" in the last 168 hours. No results for input(s): "AMMONIA" in the last 168 hours. Coagulation Profile: No results for input(s): "INR", "PROTIME" in the last 168 hours. Cardiac Enzymes: No results for input(s): "CKTOTAL", "CKMB", "CKMBINDEX", "TROPONINI" in the last 168 hours. BNP (last 3 results) No results for input(s): "PROBNP" in the last 8760 hours. HbA1C: No results for input(s): "HGBA1C" in the last 72 hours. CBG: No results for input(s): "GLUCAP" in the last 168 hours. Lipid Profile: No results for input(s): "CHOL", "HDL", "LDLCALC", "TRIG", "CHOLHDL", "LDLDIRECT" in the last 72 hours. Thyroid Function Tests: No results for input(s): "TSH", "T4TOTAL", "FREET4", "T3FREE", "THYROIDAB" in the last  72 hours. Anemia Panel: No results for input(s): "VITAMINB12", "FOLATE", "FERRITIN", "TIBC", "IRON", "RETICCTPCT" in the last 72 hours. Urine analysis:    Component Value Date/Time   COLORURINE AMBER (A) 01/05/2023 1652   APPEARANCEUR CLOUDY (A) 01/05/2023 1652   APPEARANCEUR Clear 08/20/2013 0849   LABSPEC 1.020 01/05/2023 1652   LABSPEC 1.014 08/20/2013 0849   PHURINE 5.5 01/05/2023 1652   GLUCOSEU 500 (A) 01/05/2023 1652   GLUCOSEU Negative 08/20/2013 0849   HGBUR LARGE (A) 01/05/2023 1652   BILIRUBINUR NEGATIVE 01/05/2023 1652   BILIRUBINUR Negative 08/20/2013 0849   KETONESUR TRACE (A) 01/05/2023 1652   PROTEINUR >300 (A) 01/05/2023 1652   NITRITE NEGATIVE 01/05/2023 1652   LEUKOCYTESUR MODERATE (A) 01/05/2023 1652   LEUKOCYTESUR Negative 08/20/2013 0849    Radiological Exams on Admission: CT Angio Chest PE W and/or Wo Contrast  Result Date: 01/05/2023 CLINICAL DATA:  Short of breath.  Home oxygen. EXAM: CT ANGIOGRAPHY CHEST WITH CONTRAST TECHNIQUE: Multidetector CT imaging of the chest was performed using the standard protocol during bolus administration of intravenous contrast. Multiplanar CT image reconstructions and MIPs were obtained to evaluate the vascular anatomy. RADIATION DOSE REDUCTION: This exam was performed according to the departmental dose-optimization program which includes automated exposure control, adjustment of the mA and/or kV according to patient size and/or use of iterative reconstruction technique. CONTRAST:  75mL OMNIPAQUE IOHEXOL 350 MG/ML SOLN COMPARISON:  CT 11/09/2022 FINDINGS: Cardiovascular: No filling defects within the pulmonary arteries to suggest acute pulmonary embolism. Mediastinum/Nodes: No axillary or supraclavicular adenopathy. No mediastinal or hilar adenopathy. No pericardial fluid. Esophagus normal. Lungs/Pleura: No pulmonary infarction. No pneumonia. No pleural fluid. No pneumothorax Upper Abdomen: Limited view of the liver, kidneys,  pancreas are unremarkable. Normal adrenal glands. Musculoskeletal: No acute osseous abnormality. Subcutaneous cyst in the central upper back measures 4 cm Review of the MIP images confirms the above findings. IMPRESSION: 1. No evidence acute pulmonary embolism. 2. No acute pulmonary parenchymal findings. Electronically Signed   By: Genevive Bi M.D.   On: 01/05/2023 18:05   DG Chest Portable 1 View  Result Date: 01/05/2023 CLINICAL DATA:  Short of breath, respiratory distress EXAM: PORTABLE CHEST 1 VIEW COMPARISON:  None Available. FINDINGS: Stable enlarged cardiac silhouette. There is central venous congestion mild interstitial edema pattern. No pleural fluid. No focal consolidation. No pneumothorax. IMPRESSION: Cardiomegaly and mild interstitial edema. Electronically Signed  By: Genevive Bi M.D.   On: 01/05/2023 16:28     Data Reviewed: Relevant notes from primary care and specialist visits, past discharge summaries as available in EHR, including Care Everywhere. Prior diagnostic testing as pertinent to current admission diagnoses Updated medications and problem lists for reconciliation ED course, including vitals, labs, imaging, treatment and response to treatment Triage notes, nursing and pharmacy notes and ED provider's notes Notable results as noted in HPI   Assessment and Plan: * NSTEMI (non-ST elevated myocardial infarction) (HCC) History of CAD s/p stent Patient with chest pain, elevated troponin 209>241 but without EKG changes so possible demand ischemia Continue heparin infusion Continue Isordil, metoprolol, lisinopril and atorvastatin Sublingual nitroglycerin as needed chest pain Echocardiogram to evaluate for segmental wall motion abnormality Cardiology consult    Acute on chronic diastolic CHF (congestive heart failure) (HCC) Acute on chronic respiratory failure with hypoxia Patient presents in respiratory distress requiring NRB at 15 L for transport  transitioning to BiPAP on arrival No florid CHF on chest x-ray but BNP elevated at 272 Continue IV Lasix Continue spironolactone, metoprolol and ACE/ARB Daily weights with intake and output monitoring Follow-up repeat echo.  Last EF 65% with G1 DD 06/2022 Cardiology consult  Urinary tract infection History of BPH with LUTS History of kidney stones Urinalysis with pain and burning on urination, moderate leukocyte esterase and WBC 22,000 and patient states he passed some kidney stones a few days ago CT renal stone study negative for obstructive uropathy Patient did report urinary retention and hesitancy  Continue cefepime started in the emergency room  Chronic obstructive pulmonary disease (COPD) (HCC) Acute exacerbation not suspected Continue home Symbicort/formulary substitution DuoNebs as needed  Essential hypertension Continue home meds  Type 2 diabetes mellitus with hyperglycemia, with long-term current use of insulin (HCC) Continue home basal insulin Sliding scale insulin coverage  Hypokalemia Oral repletion  OSA (obstructive sleep apnea) Currently on BiPAP  Obesity, Class III, BMI 40-49.9 (morbid obesity) (HCC) Complicating factor to overall prognosis and care    DVT prophylaxis: Lovenox  Consults: cardiology, Dr Darrold Junker covering for [facility]  Advance Care Planning:   Code Status: Prior   Family Communication: none  Disposition Plan: Back to previous home environment  Severity of Illness: The appropriate patient status for this patient is INPATIENT. Inpatient status is judged to be reasonable and necessary in order to provide the required intensity of service to ensure the patient's safety. The patient's presenting symptoms, physical exam findings, and initial radiographic and laboratory data in the context of their chronic comorbidities is felt to place them at high risk for further clinical deterioration. Furthermore, it is not anticipated that the patient  will be medically stable for discharge from the hospital within 2 midnights of admission.   * I certify that at the point of admission it is my clinical judgment that the patient will require inpatient hospital care spanning beyond 2 midnights from the point of admission due to high intensity of service, high risk for further deterioration and high frequency of surveillance required.*  Author: Andris Baumann, MD 01/05/2023 8:28 PM  For on call review [email or web address].

## 2023-01-05 NOTE — Assessment & Plan Note (Signed)
History of CAD s/p stent Patient with chest pain, elevated troponin 209>241>>157, but without EKG changes likely demand ischemia Continue heparin infusion Continue Isordil, metoprolol, lisinopril and atorvastatin Sublingual nitroglycerin as needed chest pain Echocardiogram to evaluate for segmental wall motion abnormality Cardiology is on board.

## 2023-01-05 NOTE — ED Provider Notes (Signed)
[facility] Provider Note    Event Date/Time   First MD Initiated Contact with Patient 01/05/23 1518     (approximate)   History   Respiratory Distress   HPI  Radley Twing is a 67 y.o. male who presents to the emergency department today via EMS because of concerns for shortness of breath, weakness and some chest discomfort.  Patient states that the symptoms started today.  Sounds like they gradually got worse until they became severe.  This has been accompanied by diaphoresis.  Patient does have history of CHF and did have hospitalization for this a couple months ago.  While he has noticed some lower extremity swelling he does not feel like this feels similar to his previous episode of CHF.     Physical Exam   Triage Vital Signs: ED Triage Vitals  Enc Vitals Group     BP 01/05/23 1530 (!) 155/119     Pulse Rate 01/05/23 1530 (!) 133     Resp 01/05/23 1530 (!) 23     Temp --      Temp src --      SpO2 01/05/23 1530 98 %     Weight 01/05/23 1548 300 lb (136.1 kg)     Height 01/05/23 1548 6\' 1"  (1.854 m)     Head Circumference --      Peak Flow --      Pain Score --      Pain Loc --      Pain Edu? --      Excl. in GC? --     Most recent vital signs: Vitals:   01/05/23 1530  BP: (!) 155/119  Pulse: (!) 133  Resp: (!) 23  SpO2: 98%   General: Awake, alert, oriented. CV:  Good peripheral perfusion. Tachycardia Resp:  Tachypnea. Increased work of breathing. Abd:  No distention.  Other:  Lower extremity edema. Diaphoretic.    ED Results / Procedures / Treatments   Labs (all labs ordered are listed, but only abnormal results are displayed) Labs Reviewed  CBC WITH DIFFERENTIAL/PLATELET - Abnormal; Notable for the following components:      Result Value   WBC 22.9 (*)    Neutro Abs 20.6 (*)    Lymphs Abs 0.5 (*)    Monocytes Absolute 1.6 (*)    Abs Immature Granulocytes 0.12 (*)    All other components within normal limits  SARS  CORONAVIRUS 2 BY RT PCR  BASIC METABOLIC PANEL  BRAIN NATRIURETIC PEPTIDE  TROPONIN I (HIGH SENSITIVITY)     EKG  I, Phineas Semen, attending physician, personally viewed and interpreted this EKG  EKG Time: 1527 Rate: 137 Rhythm: unclear rhythm Axis: normal Intervals: qtc 506 QRS: narrow ST changes: no st elevation Impression: abnormal EKG, artifact limits interpretation  I, Phineas Semen, attending physician, personally viewed and interpreted this EKG  EKG Time: 1612 Rate: 126 Rhythm: atrial fibrillation Axis: left axis deviation Intervals: qtc 501 QRS: narrow, q waves v1, v2 ST changes: no st elevation Impression: abnormal ekg   RADIOLOGY I independently interpreted and visualized the CXR. My interpretation: Pulmonary edema Radiology interpretation:  IMPRESSION:  Cardiomegaly and mild interstitial edema   I independently interpreted and visualized the CT angio PE. My interpretation: No pneumonia Radiology interpretation:  IMPRESSION:  1. No evidence acute pulmonary embolism.  2. No acute pulmonary parenchymal findings.       PROCEDURES:  Critical Care performed: Yes  CRITICAL CARE Performed by: Phineas Semen  Total critical care time: 40 minutes  Critical care time was exclusive of separately billable procedures and treating other patients.  Critical care was necessary to treat or prevent imminent or life-threatening deterioration.  Critical care was time spent personally by me on the following activities: development of treatment plan with patient and/or surrogate as well as nursing, discussions with consultants, evaluation of patient's response to treatment, examination of patient, obtaining history from patient or surrogate, ordering and performing treatments and interventions, ordering and review of laboratory studies, ordering and review of radiographic studies, pulse oximetry and re-evaluation of patient's condition.    Procedures    MEDICATIONS ORDERED IN ED: Medications  aspirin chewable tablet 324 mg (has no administration in time range)     IMPRESSION / MDM / ASSESSMENT AND PLAN / ED COURSE  I reviewed the triage vital signs and the nursing notes.                              Differential diagnosis includes, but is not limited to, CHF, pneumonia, PTX, COPD, ACS  Patient's presentation is most consistent with acute presentation with potential threat to life or bodily function.   The patient is on the cardiac monitor to evaluate for evidence of arrhythmia and/or significant heart rate changes.   Patient presented to the emergency department today because of concerns for shortness of breath that started today.  On initial exam patient did have increased work of breathing.  He was diaphoretic.  EKG without any ST elevation.  Given increased work of breathing did ask that patient be placed on BiPAP.  Chest x-ray was concerning for some pulmonary edema patient has history of heart failure.  Further workup was concerning for possible urinary tract infection and this could explain patient's complaint of decreased urination.  Did obtain a CT angio to evaluate for occult pneumonia or blood clots.  This was negative.  Patient did improve while being on BiPAP.  Discussed with Dr. Para March with the hospitalist service will plan on admission.     FINAL CLINICAL IMPRESSION(S) / ED DIAGNOSES   Final diagnoses:  Respiratory distress  Congestive heart failure, unspecified HF chronicity, unspecified heart failure type (HCC)  Lower urinary tract infection    Note:  This document was prepared using Dragon voice recognition software and may include unintentional dictation errors.    Phineas Semen, MD 01/05/23 2223

## 2023-01-05 NOTE — Assessment & Plan Note (Signed)
Oral repletion

## 2023-01-05 NOTE — Consult Note (Signed)
ANTICOAGULATION CONSULT NOTE - Initial Consult  Pharmacy Consult for Heparin infusion Indication: chest pain/ACS  Allergies  Allergen Reactions   Erythromycin Anaphylaxis and Swelling    Had eye swelling with erythromycin during a time when he had perf ear drum.  Tolerated azithromycin.    Patient Measurements: Height: 6\' 1"  (185.4 cm) Weight: 136.1 kg (300 lb) IBW/kg (Calculated) : 79.9 Heparin Dosing Weight: 111 kg  Vital Signs: BP: 176/99 (05/12 1830) Pulse Rate: 110 (05/12 1830)  Labs: Recent Labs    01/05/23 1535 01/05/23 1811  HGB 15.1  --   HCT 47.5  --   PLT 233  --   CREATININE 1.14  --   TROPONINIHS 209* 241*    Estimated Creatinine Clearance: 93.6 mL/min (by C-G formula based on SCr of 1.14 mg/dL).  Medical History: Past Medical History:  Diagnosis Date   CHF (congestive heart failure) (HCC)    COPD (chronic obstructive pulmonary disease) (HCC)    Diabetes mellitus without complication (HCC)    Hypertension    Neuropathy     Medications:  No AC prior to admission  Assessment: Patient admitted with increased shortness of breath and elevated troponin. PMH includes HFpEF, CAD, HTN, and DM. Pharmacy consulted to manage heparin infusion for ACS.  Baseline PLT and HGB appropriate to start heparin infusion. Baseline INR also ordered.  Goal of Therapy:  Heparin level 0.3-0.7 units/ml Monitor platelets by anticoagulation protocol: Yes   Plan:  Give 4000 units bolus x 1 Start heparin infusion at 1300 units/hr Check anti-Xa level in 6 hours and daily while on heparin Continue to monitor H&H and platelets  Tabor Bartram Rodriguez-Guzman PharmD, BCPS 01/05/2023 7:16 PM

## 2023-01-06 DIAGNOSIS — I5033 Acute on chronic diastolic (congestive) heart failure: Secondary | ICD-10-CM | POA: Diagnosis not present

## 2023-01-06 DIAGNOSIS — R3911 Hesitancy of micturition: Secondary | ICD-10-CM

## 2023-01-06 DIAGNOSIS — R0603 Acute respiratory distress: Secondary | ICD-10-CM | POA: Diagnosis not present

## 2023-01-06 DIAGNOSIS — J449 Chronic obstructive pulmonary disease, unspecified: Secondary | ICD-10-CM

## 2023-01-06 DIAGNOSIS — G4733 Obstructive sleep apnea (adult) (pediatric): Secondary | ICD-10-CM

## 2023-01-06 DIAGNOSIS — N3000 Acute cystitis without hematuria: Secondary | ICD-10-CM | POA: Diagnosis not present

## 2023-01-06 DIAGNOSIS — E1165 Type 2 diabetes mellitus with hyperglycemia: Secondary | ICD-10-CM

## 2023-01-06 DIAGNOSIS — N401 Enlarged prostate with lower urinary tract symptoms: Secondary | ICD-10-CM

## 2023-01-06 DIAGNOSIS — R079 Chest pain, unspecified: Secondary | ICD-10-CM | POA: Diagnosis not present

## 2023-01-06 DIAGNOSIS — Z794 Long term (current) use of insulin: Secondary | ICD-10-CM

## 2023-01-06 DIAGNOSIS — J9621 Acute and chronic respiratory failure with hypoxia: Secondary | ICD-10-CM

## 2023-01-06 DIAGNOSIS — Z87442 Personal history of urinary calculi: Secondary | ICD-10-CM | POA: Insufficient documentation

## 2023-01-06 DIAGNOSIS — I214 Non-ST elevation (NSTEMI) myocardial infarction: Secondary | ICD-10-CM

## 2023-01-06 LAB — BASIC METABOLIC PANEL
Anion gap: 13 (ref 5–15)
BUN: 20 mg/dL (ref 8–23)
CO2: 33 mmol/L — ABNORMAL HIGH (ref 22–32)
Calcium: 8.3 mg/dL — ABNORMAL LOW (ref 8.9–10.3)
Chloride: 93 mmol/L — ABNORMAL LOW (ref 98–111)
Creatinine, Ser: 1.05 mg/dL (ref 0.61–1.24)
GFR, Estimated: >60 mL/min (ref >60)
Glucose, Bld: 166 mg/dL — ABNORMAL HIGH (ref 70–99)
Potassium: 3.6 mmol/L (ref 3.5–5.1)
Sodium: 139 mmol/L (ref 135–145)

## 2023-01-06 LAB — TROPONIN I (HIGH SENSITIVITY): Troponin I (High Sensitivity): 157 ng/L (ref <18)

## 2023-01-06 LAB — HEPARIN LEVEL (UNFRACTIONATED)
Heparin Unfractionated: <0.1 IU/mL — ABNORMAL LOW (ref 0.30–0.70)
Heparin Unfractionated: <0.1 IU/mL — ABNORMAL LOW (ref 0.30–0.70)
Heparin Unfractionated: <0.1 IU/mL — ABNORMAL LOW (ref 0.30–0.70)
Heparin Unfractionated: <0.1 IU/mL — ABNORMAL LOW (ref 0.30–0.70)

## 2023-01-06 LAB — CBG MONITORING, ED
Glucose-Capillary: 162 mg/dL — ABNORMAL HIGH (ref 70–99)
Glucose-Capillary: 173 mg/dL — ABNORMAL HIGH (ref 70–99)
Glucose-Capillary: 174 mg/dL — ABNORMAL HIGH (ref 70–99)
Glucose-Capillary: 161 mg/dL — ABNORMAL HIGH (ref 70–99)

## 2023-01-06 LAB — PROTIME-INR
INR: 1.1 (ref 0.8–1.2)
Prothrombin Time: 14.3 seconds (ref 11.4–15.2)

## 2023-01-06 MED ORDER — HEPARIN BOLUS VIA INFUSION
3000.0000 [IU] | Freq: Once | INTRAVENOUS | Status: AC
  Administered 2023-01-06: 3000 [IU] via INTRAVENOUS
  Filled 2023-01-06: qty 3000

## 2023-01-06 MED ORDER — LIDOCAINE 5 % EX PTCH: 2.0000 | MEDICATED_PATCH | CUTANEOUS | Status: DC

## 2023-01-06 MED ORDER — MAGNESIUM OXIDE 400 MG PO TABS
400.0000 mg | ORAL_TABLET | Freq: Two times a day (BID) | ORAL | Status: DC
  Administered 2023-01-06 – 2023-01-07 (×4): 400 mg via ORAL
  Filled 2023-01-06 (×9): qty 1

## 2023-01-06 MED ORDER — TAMSULOSIN HCL 0.4 MG PO CAPS
0.4000 mg | ORAL_CAPSULE | Freq: Every day | ORAL | Status: DC
  Administered 2023-01-06 – 2023-01-11 (×5): 0.4 mg via ORAL
  Filled 2023-01-06 (×5): qty 1

## 2023-01-06 MED ORDER — SODIUM CHLORIDE 0.9 % IV SOLN: 2.0000 g | Freq: Two times a day (BID) | INTRAVENOUS | Status: DC

## 2023-01-06 MED ORDER — SODIUM CHLORIDE 0.9 % IV SOLN
1.0000 g | Freq: Two times a day (BID) | INTRAVENOUS | Status: DC
  Administered 2023-01-06: 1 g via INTRAVENOUS
  Filled 2023-01-06: qty 10

## 2023-01-06 MED ORDER — SODIUM CHLORIDE 0.9 % IV SOLN
1.0000 g | INTRAVENOUS | Status: AC
  Administered 2023-01-06 – 2023-01-10 (×5): 1 g via INTRAVENOUS
  Filled 2023-01-06 (×6): qty 10

## 2023-01-06 MED ORDER — KETOROLAC TROMETHAMINE 15 MG/ML IJ SOLN
15.0000 mg | Freq: Four times a day (QID) | INTRAMUSCULAR | Status: DC | PRN
  Administered 2023-01-07 – 2023-01-08 (×3): 15 mg via INTRAVENOUS
  Filled 2023-01-06 (×3): qty 1

## 2023-01-06 MED ORDER — OXYCODONE-ACETAMINOPHEN 5-325 MG PO TABS
1.0000 | ORAL_TABLET | ORAL | Status: DC | PRN
  Administered 2023-01-06 (×3): 1 via ORAL
  Filled 2023-01-06 (×3): qty 1

## 2023-01-06 MED ORDER — MORPHINE SULFATE (PF) 2 MG/ML IV SOLN: 2.0000 mg | INTRAVENOUS | Status: DC | PRN

## 2023-01-06 MED ORDER — FLUTICASONE FUROATE-VILANTEROL 200-25 MCG/ACT IN AEPB: 1.0000 | INHALATION_SPRAY | Freq: Every day | RESPIRATORY_TRACT | Status: DC

## 2023-01-06 MED ORDER — HEPARIN BOLUS VIA INFUSION
3300.0000 [IU] | Freq: Once | INTRAVENOUS | Status: AC
  Administered 2023-01-06: 3300 [IU] via INTRAVENOUS
  Filled 2023-01-06: qty 3300

## 2023-01-06 NOTE — Progress Notes (Signed)
Pharmacy Antibiotic Note  Blane Pask is a 67 y.o. male admitted on 01/05/2023 with UTI.  Pharmacy has been consulted for Cefepime dosing.  Plan: Cefepime 2 gm IV X 1 given in ED on 5/12 @ 1804. Cefepime 1 gm IV Q12H ordered to resume 5/13 @ 0600.   Height: 6\' 1"  (185.4 cm) Weight: 136.1 kg (300 lb) IBW/kg (Calculated) : 79.9  Temp (24hrs), Avg:98.5 F (36.9 C), Min:98.5 F (36.9 C), Max:98.5 F (36.9 C)  Recent Labs  Lab 01/05/23 1535  WBC 22.9*  CREATININE 1.14    Estimated Creatinine Clearance: 93.6 mL/min (by C-G formula based on SCr of 1.14 mg/dL).    Allergies  Allergen Reactions   Erythromycin Anaphylaxis and Swelling    Had eye swelling with erythromycin during a time when he had perf ear drum.  Tolerated azithromycin.    Antimicrobials this admission:   >>    >>   Dose adjustments this admission:   Microbiology results:  BCx:   UCx:    Sputum:    MRSA PCR:   Thank you for allowing pharmacy to be a part of this patient's care.  Luca Dyar D 01/06/2023 4:03 AM

## 2023-01-06 NOTE — ED Notes (Signed)
Provider messaged about pt refusing all of his night time medications. No new orders at this time.

## 2023-01-06 NOTE — Progress Notes (Signed)
Progress Note   Patient: Cody Alexander ZOX:[id number] DOB: 08/23/1956 DOA: 01/05/2023     1 DOS: the patient was seen and examined on 01/06/2023   Brief hospital course: Taken from H&P.  Cody Alexander is a 67 y.o. 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 O2 sats with EMS on 4L was 86% and patient was placed on NRB at 15L for transport transitioning to Bipap on arrival.   ED course and data review: On arrival tachycardic to 133 and tachypneic to 36, O2 sat 98% on BiPAP.  BP 155/119. Labs: Troponin 209-241 and BNP 272.  WBC 22,000.  Glucose 261, potassium 3.3, urinalysis with moderate leukocyte esterase. EKG, showing sinus tachycardia at 114 with nonspecific ST-T wave changes. CTA chest showing no evidence of PE and no acute pulmonary parenchymal findings. CT renal stone study negative for obstructive uropathy.  Small amount of gas in the bladder presumably related to procedure and small bladder diverticula Chest x-ray cardiomegaly and mild interstitial edema.  Patient treated with chewable aspirin 324 started on cefepime for UTI, given a dose of Lasix 40 mg IV and started on a heparin infusion for NSTEMI.  5/13: Vital stable.  Able to wean back to 4 L.  Continue to have significant dysuria.  No chest pain this morning, troponin started trending down, peaked at 241, most likely demand ischemia.  Cardiology is on board. Antibiotic switched with ceftriaxone as he has an history of prior pansensitive E. coli UTI, urine cultures ordered as add-on. No history of any recent bladder procedure.   Assessment and Plan: * NSTEMI (non-ST elevated myocardial infarction) (HCC) History of CAD s/p stent Patient  with chest pain, elevated troponin 209>241>>157, but without EKG changes likely demand ischemia Continue heparin infusion Continue Isordil, metoprolol, lisinopril and atorvastatin Sublingual nitroglycerin as needed chest pain Echocardiogram to evaluate for segmental wall motion abnormality Cardiology is on board.    Acute on chronic diastolic CHF (congestive heart failure) (HCC) Acute on chronic respiratory failure with hypoxia Patient presents in respiratory distress requiring NRB at 15 L for transport transitioning to BiPAP on arrival No florid CHF on chest x-ray but BNP elevated at 272 Continue IV Lasix Continue spironolactone, metoprolol and ACE/ARB Daily weights with intake and output monitoring Follow-up repeat echo.  Last EF 65% with G1 DD 06/2022 Cardiology consult  Urinary tract infection History of BPH with LUTS History of kidney stones Urinalysis with pain and burning on urination, moderate leukocyte esterase and WBC 22,000 and patient states he passed some kidney stones a few days ago CT renal stone study negative for obstructive uropathy Patient did report urinary retention, dysuria and hesitancy  He was started on cefepime in ED-switching to ceftriaxone Add on urine culture  Acute on chronic respiratory failure with hypoxia (HCC) Initially requiring BiPAP, now back to baseline oxygen requirement of 4 L.  Chronic obstructive pulmonary disease (COPD) (HCC) Acute exacerbation not suspected Continue home Symbicort/formulary substitution DuoNebs as needed  Essential hypertension Continue home meds  Type 2 diabetes mellitus with hyperglycemia, with long-term current use of insulin (HCC) Continue home basal insulin Sliding scale insulin coverage  Hypokalemia Oral repletion  Benign prostatic hyperplasia with urinary hesitancy -Continue home Flomax  OSA (  obstructive sleep apnea) Currently on BiPAP  Obesity, Class III, BMI 40-49.9 (morbid obesity)  (HCC) Complicating factor to overall prognosis and care   Subjective: Patient continued to have significant dysuria.  No chest pain this morning.  Physical Exam: Vitals:   01/06/23 1100 01/06/23 1130 01/06/23 1200 01/06/23 1230  BP: 129/76 125/83 128/79 121/76  Pulse: 80 76 82 88  Resp:      Temp:      TempSrc:      SpO2: 97% 97% 99% 95%  Weight:      Height:       General.  Obese gentleman, in no acute distress. Pulmonary.  Lungs clear bilaterally, normal respiratory effort. CV.  Regular rate and rhythm, no JVD, rub or murmur. Abdomen.  Soft, nontender, nondistended, BS positive. CNS.  Alert and oriented .  No focal neurologic deficit. Extremities.  Trace LE edema, no cyanosis, pulses intact and symmetrical. Psychiatry.  Judgment and insight appears normal.   Data Reviewed: Prior data reviewed  Family Communication: Discussed with patient  Disposition: Status is: Inpatient Remains inpatient appropriate because: Severity of illness  Planned Discharge Destination: Home  DVT prophylaxis.  Heparin Time spent: 50 minutes  This record has been created using Conservation officer, historic buildings. Errors have been sought and corrected,but may not always be located. Such creation errors do not reflect on the standard of care.   Author: Arnetha Courser, MD 01/06/2023 1:24 PM  For on call review [email or web address].

## 2023-01-06 NOTE — Assessment & Plan Note (Signed)
Initially requiring BiPAP, now back to baseline oxygen requirement of 4 L.

## 2023-01-06 NOTE — Consult Note (Signed)
ANTICOAGULATION CONSULT NOTE - Initial Consult  Pharmacy Consult for Heparin infusion Indication: chest pain/ACS  Allergies  Allergen Reactions   Erythromycin Anaphylaxis and Swelling    Had eye swelling with erythromycin during a time when he had perf ear drum.  Tolerated azithromycin.   Patient Measurements: Height: 6\' 1"  (185.4 cm) Weight: 136.1 kg (300 lb) IBW/kg (Calculated) : 79.9 Heparin Dosing Weight: 111 kg  Vital Signs: Temp: 98.3 F (36.8 C) (05/13 0722) Temp Source: Oral (05/13 0722) BP: 143/87 (05/13 0700) Pulse Rate: 84 (05/13 0700)  Labs: Recent Labs    01/05/23 1535 01/05/23 1811 01/05/23 2111 01/05/23 2113 01/06/23 0409  HGB 15.1  --   --   --   --   HCT 47.5  --   --   --   --   PLT 233  --   --   --   --   LABPROT  --   --  14.3  --   --   INR  --   --  1.1  --   --   HEPARINUNFRC  --   --   --  <0.10*  --   CREATININE 1.14  --   --   --  1.05  TROPONINIHS 209* 241*  --   --   --     Estimated Creatinine Clearance: 101.6 mL/min (by C-G formula based on SCr of 1.05 mg/dL).  Medical History: Past Medical History:  Diagnosis Date   CHF (congestive heart failure) (HCC)    COPD (chronic obstructive pulmonary disease) (HCC)    Diabetes mellitus without complication (HCC)    Hypertension    Neuropathy     Medications:  No AC prior to admission  Assessment: Patient admitted with increased shortness of breath and elevated troponin. PMH includes HFpEF, CAD, HTN, and DM. Pharmacy consulted to manage heparin infusion for ACS.  Results Date/Time aPTT/HL Comments 5/13@0500  HL < 0.10 Sub-therapeutic at 1300 un/hr ->1600 un/hr  Goal of Therapy:  Heparin level 0.3-0.7 units/ml Monitor platelets by anticoagulation protocol: Yes    Plan:  Give 3000 units bolus x 1 Increase heparin infusion rate to 1600 units/hr Check anti-Xa level 6 hours after rate chnage and daily while on heparin Continue to monitor H&H and platelets  Bobetta Korf  Rodriguez-Guzman PharmD, BCPS 01/06/2023 7:57 AM

## 2023-01-06 NOTE — Assessment & Plan Note (Signed)
-   Continue home Flomax 

## 2023-01-06 NOTE — ED Notes (Signed)
Pt pressed call bell because he "dropped his remote" referencing the tv remote. EDT picked up remote and moved bedside table beside pt.   Pt provided warm blanket, per request.

## 2023-01-06 NOTE — Discharge Instructions (Addendum)
Heart Healthy, Consistent Carbohydrate Nutrition Therapy   A heart-healthy and consistent carbohydrate diet is recommended to manage heart disease and diabetes. To follow a heart-healthy and consistent carbohydrate diet, Eat a balanced diet with whole grains, fruits and vegetables, and lean protein sources.  Choose heart-healthy unsaturated fats. Limit saturated fats, trans fats, and cholesterol intake. Eat more plant-based or vegetarian meals using beans and soy foods for protein.  Eat whole, unprocessed foods to limit the amount of sodium (salt) you eat.  Choose a consistent amount of carbohydrate at each meal and snack. Limit refined carbohydrates especially sugar, sweets and sugar-sweetened beverages.  If you drink alcohol, do so in moderation: one serving per day (women) and two servings per day (men). o One serving is equivalent to 12 ounces beer, 5 ounces wine, or 1.5 ounces distilled spirits  Tips Tips for Choosing Heart-Healthy Fats Choose lean protein and low-fat dairy foods to reduce saturated fat intake. Saturated fat is usually found in animal-based protein and is associated with certain health risks. Saturated fat is the biggest contributor to raise low-density lipoprotein (LDL) cholesterol levels. Research shows that limiting saturated fat lowers unhealthy cholesterol levels. Eat no more than 7% of your total calories each day from saturated fat. Ask your RDN to help you determine how much saturated fat is right for you. There are many foods that do not contain large amounts of saturated fats. Swapping these foods to replace foods high in saturated fats will help you limit the saturated fat you eat and improve your cholesterol levels. You can also try eating more plant-based or vegetarian meals. Instead of. Try:  Whole milk, cheese, yogurt, and ice cream 1% or skim milk, low-fat cheese, non-fat yogurt, and low-fat ice cream  Fatty, marbled beef and pork Lean beef, pork, or venison   Poultry with skin Poultry without skin  Butter, stick margarine Reduced-fat, whipped, or liquid spreads  Coconut oil, palm oil Liquid vegetable oils: corn, canola, olive, soybean and safflower oils   Avoid foods that contain trans fats. Trans fats increase levels of LDL-cholesterol. Hydrogenated fat in processed foods is the main source of trans fats in foods.  Trans fats can be found in stick margarine, shortening, processed sweets, baked goods, some fried foods, and packaged foods made with hydrogenated oils. Avoid foods with "partially hydrogenated oil" on the ingredient list such as: cookies, pastries, baked goods, biscuits, crackers, microwave popcorn, and frozen dinners. Choose foods with heart healthy fats. Polyunsaturated and monounsaturated fat are unsaturated fats that may help lower your blood cholesterol level when used in place of saturated fat in your diet. Ask your RDN about taking a dietary supplement with plant sterols and stanols to help lower your cholesterol level. Research shows that substituting saturated fats with unsaturated fats is beneficial to cholesterol levels. Try these easy swaps: Instead of. Try:  Butter, stick margarine, or solid shortening Reduced-fat, whipped, or liquid spreads  Beef, pork, or poultry with skin Fish and seafood  Chips, crackers, snack foods Raw or unsalted nuts and seeds or nut butters Hummus with vegetables Avocado on toast  Coconut oil, palm oil Liquid vegetable oils: corn, canola, olive, soybean and safflower oils  Limit the amount of cholesterol you eat to less than 200 milligrams per day. Cholesterol is a substance carried through the bloodstream via lipoproteins, which are known as "transporters" of fat. Some body functions need cholesterol to work properly, but too much cholesterol in the bloodstream can damage arteries and build up blood vessel linings (  which can lead to heart attack and stroke). You should eat less than 200 milligrams  cholesterol per day. People respond differently to eating cholesterol. There is no test available right now that can figure out which people will respond more to dietary cholesterol and which will respond less. For individuals with high intake of dietary cholesterol, different types of increase (none, small, moderate, large) in LDL-cholesterol levels are all possible.  Food sources of cholesterol include egg yolks and organ meats such as liver, gizzards. Limit egg yolks to two to four per week and avoid organ meats like liver and gizzards to control cholesterol intake. Tips for Choosing Heart-Healthy Carbohydrates Consume a consistent amount of carbohydrate It is important to eat foods with carbohydrates in moderation because they impact your blood glucose level. Carbohydrates can be found in many foods such as: Grains (breads, crackers, rice, pasta, and cereals)  Starchy Vegetables (potatoes, corn, and peas)  Beans and legumes  Milk, soy milk, and yogurt  Fruit and fruit juice  Sweets (cakes, cookies, ice cream, jam and jelly) Your RDN will help you set a goal for how many carbohydrate servings to eat at your meals and snacks. For many adults, eating 3 to 5 servings of carbohydrate foods at each meal and 1 or 2 carbohydrate servings for each snack works well.  Check your blood glucose level regularly. It can tell you if you need to adjust when you eat carbohydrates. Choose foods rich in viscous (soluble) fiber Viscous, or soluble, is found in the walls of plant cells. Viscous fiber is found only in plant-based foods. Eating foods with fiber helps to lower your unhealthy cholesterol and keep your blood glucose in range  Rich sources of viscous fiber include vegetables (asparagus, Brussels sprouts, sweet potatoes, turnips) fruit (apricots, mangoes, oranges), legumes, and whole grains (barley, oats, and oat bran).  As you increase your fiber intake gradually, also increase the amount of water you  drink. This will help prevent constipation.  If you have difficulty achieving this goal, ask your RDN about fiber laxatives. Choose fiber supplements made with viscous fibers such as psyllium seed husks or methylcellulose to help lower unhealthy cholesterol.  Limit refined carbohydrates  There are three types of carbohydrates: starches, sugar, and fiber. Some carbohydrates occur naturally in food, like the starches in rice or corn or the sugars in fruits and milk. Refined carbohydrates--foods with high amounts of simple sugars--can raise triglyceride levels. High triglyceride levels are associated with coronary heart disease. Some examples of refined carbohydrate foods are table sugar, sweets, and beverages sweetened with added sugar. Tips for Reducing Sodium (Salt) Although sodium is important for your body to function, too much sodium can be harmful for people with high blood pressure. As sodium and fluid buildup in your tissues and bloodstream, your blood pressure increases. High blood pressure may cause damage to other organs and increase your risk for a stroke. Even if you take a pill for blood pressure or a water pill (diuretic) to remove fluid, it is still important to have less salt in your diet. Ask your doctor and RDN what amount of sodium is right for you. Avoid processed foods. Eat more fresh foods.  Fresh fruits and vegetables are naturally low in sodium, as well as frozen vegetables and fruits that have no added juices or sauces.  Fresh meats are lower in sodium than processed meats, such as bacon, sausage, and hotdogs. Read the nutrition label or ask your butcher to help you find a  fresh meat that is low in sodium. Eat less salt--at the table and when cooking.  A single teaspoon of table salt has 2,300 mg of sodium.  Leave the salt out of recipes for pasta, casseroles, and soups.  Ask your RDN how to cook your favorite recipes without sodium Be a smart shopper.  Look for food packages  that say "salt-free" or "sodium-free." These items contain less than 5 milligrams of sodium per serving.  "Very low-sodium" products contain less than 35 milligrams of sodium per serving.  "Low-sodium" products contain less than 140 milligrams of sodium per serving.  Beware for "Unsalted" or "No Added Salt" products. These items may still be high in sodium. Check the nutrition label. Add flavors to your food without adding sodium.  Try lemon juice, lime juice, fruit juice or vinegar.  Dry or fresh herbs add flavor. Try basil, bay leaf, dill, rosemary, parsley, sage, dry mustard, nutmeg, thyme, and paprika.  Pepper, red pepper flakes, and cayenne pepper can add spice t your meals without adding sodium. Hot sauce contains sodium, but if you use just a drop or two, it will not add up to much.  Buy a sodium-free seasoning blend or make your own at home. Additional Lifestyle Tips Achieve and maintain a healthy weight. Talk with your RDN or your doctor about what is a healthy weight for you. Set goals to reach and maintain that weight.  To lose weight, reduce your calorie intake along with increasing your physical activity. A weight loss of 10 to 15 pounds could reduce LDL-cholesterol by 5 milligrams per deciliter. Participate in physical activity. Talk with your health care team to find out what types of physical activity are best for you. Set a plan to get about 30 minutes of exercise on most days.  Foods Recommended Food Group Foods Recommended  Grains Whole grain breads and cereals, including whole wheat, barley, rye, buckwheat, corn, teff, quinoa, millet, amaranth, brown or wild rice, sorghum, and oats Pasta, especially whole wheat or other whole grain types  [employer], quinoa or wild rice Whole grain crackers, bread, rolls, pitas Home-made bread with reduced-sodium baking soda  Protein Foods Lean cuts of beef and pork (loin, leg, round, extra lean hamburger)  Skinless Press photographer  and other wild game Dried beans and peas Nuts and nut butters Meat alternatives made with soy or textured vegetable protein  Egg whites or egg substitute Cold cuts made with lean meat or soy protein  Dairy Nonfat (skim), low-fat, or 1%-fat milk  Nonfat or low-fat yogurt or cottage cheese Fat-free and low-fat cheese  Vegetables Fresh, frozen, or canned vegetables without added fat or salt   Fruits Fresh, frozen, canned, or dried fruit   Oils Unsaturated oils (corn, olive, peanut, soy, sunflower, canola)  Soft or liquid margarines and vegetable oil spreads  Salad dressings Seeds and nuts  Avocado   Foods Not Recommended Food Group Foods Not Recommended  Grains Breads or crackers topped with salt Cereals (hot or cold) with more than 300 mg sodium per serving Biscuits, cornbread, and other "quick" breads prepared with baking soda Bread crumbs or stuffing mix from a store High-fat bakery products, such as doughnuts, biscuits, croissants, danish pastries, pies, cookies Instant cooking foods to which you add hot water and stir--potatoes, noodles, rice, etc. Packaged starchy foods--seasoned noodle or rice dishes, stuffing mix, macaroni and cheese dinner Snacks made with partially hydrogenated oils, including chips, cheese puffs, snack mixes, regular crackers, butter-flavored popcorn  Protein Foods  Higher-fat cuts of meats (ribs, t-bone steak, regular hamburger) Bacon, sausage, or hot dogs Cold cuts, such as salami or bologna, deli meats, cured meats, corned beef Organ meats (liver, brains, gizzards, sweetbreads) Poultry with skin Fried or smoked meat, poultry, and fish Whole eggs and egg yolks (more than 2-4 per week) Salted legumes, nuts, seeds, or nut/seed butters Meat alternatives with high levels of sodium (>300 mg per serving) or saturated fat (>5 g per serving)  Dairy Whole milk,?2% fat milk, buttermilk Whole milk yogurt or ice cream Cream Half-&-half Cream cheese Sour  cream Cheese  Vegetables Canned or frozen vegetables with salt, fresh vegetables prepared with salt, butter, cheese, or cream sauce Fried vegetables Pickled vegetables such as olives, pickles, or sauerkraut  Fruits Fried fruits Fruits served with butter or cream  Oils Butter, stick margarine, shortening Partially hydrogenated oils or trans fats Tropical oils (coconut, palm, palm kernel oils)  Other Candy, sugar sweetened soft drinks and desserts Salt, sea salt, garlic salt, and seasoning mixes containing salt Bouillon cubes Ketchup, barbecue sauce, Worcestershire sauce, soy sauce, teriyaki sauce Miso Salsa Pickles, olives, relish   Heart Healthy Consistent Carbohydrate Vegetarian (Lacto-Ovo) Sample 1-Day Menu  Breakfast 1 cup oatmeal, cooked (2 carbohydrate servings)   cup blueberries (1 carbohydrate serving)  11 almonds, without salt  1 cup 1% milk (1 carbohydrate serving)  1 cup coffee  Morning Snack 1 cup fat-free plain yogurt (1 carbohydrate serving)  Lunch 1 whole wheat bun (1 carbohydrate servings)  1 black bean burger (1 carbohydrate servings)  1 slice cheddar cheese, low sodium  2 slices tomatoes  2 leaves lettuce  1 teaspoon mustard  1 small pear (1 carbohydrate servings)  1 cup green tea, unsweetened  Afternoon Snack 1/3 cup trail mix with nuts, seeds, and raisins, without salt (1 carbohydrate servinga)  Evening Meal  cup meatless chicken  2/3 cup brown rice, cooked (2 carbohydrate servings)  1 cup broccoli, cooked (2/3 carbohydrate serving)   cup carrots, cooked (1/3 carbohydrate serving)  2 teaspoons olive oil  1 teaspoon balsamic vinegar  1 whole wheat dinner roll (1 carbohydrate serving)  1 teaspoon margarine, soft, tub  1 cup 1% milk (1 carbohydrate serving)  Evening Snack 1 extra small banana (1 carbohydrate serving)  1 tablespoon peanut butter   Heart Healthy Consistent Carbohydrate Vegan Sample 1-Day Menu  Breakfast 1 cup oatmeal, cooked (2  carbohydrate servings)   cup blueberries (1 carbohydrate serving)  11 almonds, without salt  1 cup soymilk fortified with calcium, vitamin B12, and vitamin D  1 cup coffee  Morning Snack 6 ounces soy yogurt (1 carbohydrate servings)  Lunch 1 whole wheat bun(1 carbohydrate servings)  1 black bean burger (1 carbohydrate serving)  2 slices tomatoes  2 leaves lettuce  1 teaspoon mustard  1 small pear (1 carbohydrate servings)  1 cup green tea, unsweetened  Afternoon Snack 1/3 cup trail mix with nuts, seeds, and raisins, without salt (1 carbohydrate servings)  Evening Meal  cup meatless chicken  2/3 cup brown rice, cooked (2 carbohydrate servings)  1 cup broccoli, cooked (2/3 carbohydrate serving)   cup carrots, cooked (1/3 carbohydrate serving)  2 teaspoons olive oil  1 teaspoon balsamic vinegar  1 whole wheat dinner roll (1 carbohydrate serving)  1 teaspoon margarine, soft, tub  1 cup soymilk fortified with calcium, vitamin B12, and vitamin D  Evening Snack 1 extra small banana (1 carbohydrate serving)  1 tablespoon peanut butter    Heart Healthy Consistent Carbohydrate Sample  1-Day Menu  Breakfast 1 cup cooked oatmeal (2 carbohydrate servings)  3/4 cup blueberries (1 carbohydrate serving)  1 ounce almonds  1 cup skim milk (1 carbohydrate serving)  1 cup coffee  Morning Snack 1 cup sugar-free nonfat yogurt (1 carbohydrate serving)  Lunch 2 slices whole-wheat bread (2 carbohydrate servings)  2 ounces lean [place] breast  1 ounce low-fat Swiss cheese  1 teaspoon mustard  1 slice tomato  1 lettuce leaf  1 small pear (1 carbohydrate serving)  1 cup skim milk (1 carbohydrate serving)  Afternoon Snack 1 ounce trail mix with unsalted nuts, seeds, and raisins (1 carbohydrate serving)  Evening Meal 3 ounces salmon  2/3 cup cooked brown rice (2 carbohydrate servings)  1 teaspoon soft margarine  1 cup cooked broccoli with 1/2 cup cooked carrots (1 carbohydrate serving  Carrots,  cooked, boiled, drained, without salt  1 cup lettuce  1 teaspoon olive oil with vinegar for dressing  1 small whole grain roll (1 carbohydrate serving)  1 teaspoon soft margarine  1 cup unsweetened tea  Evening Snack 1 extra-small banana (1 carbohydrate serving)  Copyright 2020  Academy of Nutrition and Dietetics. All rights reserved.     Rent/Utility/Housing  Agency Name: [facility] Address: [address] [place], [place] [address] Phone: [phone] Email: [email or web address] Website: [email or web address] Service(s) Offered: Housing services, self-sufficiency, congregate meal  program, weatherization program, Field seismologist program, emergency food assistance,  housing counseling, home ownership program, wheels -towork program.  Agency Name: [facility] Address: [address], [place], [place] [address] Phone: [phone] (8a-4p) [phone] (8p- 10p) Email: [email or web address] Website: [email or web address] Service(s) Offered: A program for homeless and/or needy men that includes one-on-one counseling, life skills training and job rehabilitation.  Agency Name: [facility] Address: [address] [place], [place] [address] Phone: [phone] Website: [email or web address] Service(s) Offered: Assistance to needy in emergency with utility bills, heating  fuel, and prescriptions. Shelter for homeless 7pm-7am. December 19, 2016 15  Agency Name: [facility] [place] [facility] Address: [address], [place], [place] [address] Phone: [phone] Contact Person: Cathleen Corti Email: [email or web address] Website: [email or web address] Service(s) Offered: Helps individuals with developmental disabilities move  from housing that is more restrictive to homes where they  can achieve greater independence and have more  opportunities.  Agency Name:  [facility] Address: [address], [place], [place] [address] Phone: [phone] Email: [email or web address] Website: [email or web address] Service(s) Offered: Provides affordable housing for low-income families,  elderly, and disabled individuals. Offer a wide range of  programs and services, from financial planning to afterschool and summer programs.  Agency Name: [facility] Address: [address] [place], [place] [address] Phone: [phone] Service(s) Offered: Child support services; child welfare services; food stamps;  Medicaid; work first family assistance; and aid with fuel,  rent, food and medicine.  Agency Name: [facility] [place] [employer]. Address: [facility] [address] [place], [place]  [address] Phone: [phone] Website: [email or web address].[email or web address] Service(s) Offered: 24 hour Crisis Line: [phone]; 24 hour Emergency Shelter;  Transitional Housing; Support Groups; Scientist, physiological;  [employer]; Hispanic Outreach: [phone];  Visitation Center: [phone]. December 19, 2016 16  Agency Name: [facility] [place]. Address: [address], [place], [place] [address] Phone: [phone] Service(s) Offered: CAP Services; Home and [employer]; Individual  or Group Supports; Respite Care Non-Institutional Nursing;  Residential Supports; Respite Care and Personal Care  Services; Transportation; Family and Friends Night;  Recreational Activities; Three Nutritious Meals/Snacks;  Consultation with Registered Dietician; Twenty-four hour  Registered Nurse Access; Daily and [employer]; [employer]; [place] for the [employer] (During Summer Months) Bingo Night (Every  Wednesday Night); Special Populations Dance Night  (Every Tuesday Night); Professional Hair Care Services.  Agency Name: [facility] Address: [address],  [place], [place] [address] Phone: [phone] Contact Person: Jabier Mutton Website: [email or web address].Physicist, medical) Offered: Residential treatment facility for women; food and  clothing, educational & employment development and  transportation to work; Counsellor of financial skills;  parenting and family reunification; emotional and spiritual  support; transitional housing for program graduates.  Agency Name: [facility] Address: [address] [place], [place] [address] Phone: [phone] Email: [email or web address] Website: [email or web address].es Service(s) Offered: Public housing units for elderly, disabled, and low income  people; housing choice vouchers for income eligible  applicants; shelter plus care vouchers; and [employer] program. December 19, 2016 17  Agency Name: [facility] Address: [address], [place], [place] [address] Phone: [phone] Email: [email or web address] Website: www.habitatalamance.org Service(s) Offered: Build houses for families in need of decent housing. Each  adult in the family must invest 200 hours of labor on  someone else's house, work with volunteers to build their  own house, attend classes on budgeting, home maintenance, yard care, and attend homeowner association  meetings.  Agency Name: [facility] Address: [address], [place], [place] [address] Phone: [phone] Website: www.rsli.org Service(s) Offered: Intermediate care facilities for mentally retarded,  Supervised Living in group homes for adults with  developmental disabilities, Supervised Living for people  who have dual diagnoses (MRMI), Independent Living,  Supported Living, respite and a variety of CAP services,  pre-vocational services, day supports, and [employer].  Agency Name: [place] [employer] Phone: [phone] Website:  www.[place]oreclosurePrevention.gov Service(s) Offered: Zero-interest, deferred loans to homeowners struggling to  pay their mortgage. Call for more information

## 2023-01-06 NOTE — Inpatient Diabetes Management (Signed)
Inpatient Diabetes Program Recommendations  AACE/ADA: New Consensus Statement on Inpatient Glycemic Control (2015)  Target Ranges:  Prepandial:   less than 140 mg/dL      Peak postprandial:   less than 180 mg/dL (1-2 hours)      Critically ill patients:  140 - 180 mg/dL   Lab Results  Component Value Date   GLUCAP 173 (H) 01/06/2023   HGBA1C 11.2 (H) 11/10/2022    Review of Glycemic Control  Latest Reference Range & Units 01/05/23 22:07 01/06/23 07:18 01/06/23 11:31  Glucose-Capillary 70 - 99 mg/dL 756 (H) 433 (H) 295 (H)   Diabetes history: DM 2 Outpatient Diabetes medications:  Lantus 40 units bid Metformin 1000 mg daily Current orders for Inpatient glycemic control:  Novolog 0-15 units tid with meals and HS Levemir 30 units q HS  Inpatient Diabetes Program Recommendations:    DM coordinator spoke to patient on in March of 2024 regarding insulin/DM.   Per medication reconciliation, patient is taking Lantus instead of Levemir at home.   Blood sugars within goals today.   Will follow.   Thanks,  Beryl Meager, RN, BC-ADM Inpatient Diabetes Coordinator Pager [phone]  (8a-5p)

## 2023-01-06 NOTE — Progress Notes (Signed)
Chap visited Cody Alexander in response to Spiritual Consult. Cody Alexander expressed the need of a prayer since he was not feeling good. I honored this request and prayed with him for Peace in Pain, Strength, and Divine help as he goes through the treatment. Pt appreciated the visit.   01/06/23 1000  Spiritual Encounters  Type of Visit Initial  Care provided to: Patient  Referral source Patient request  Reason for visit Urgent spiritual support  OnCall Visit No  Spiritual Framework  Presenting Themes Values and beliefs;Courage hope and growth  Interventions  Spiritual Care Interventions Made Established relationship of care and support;Prayer  Intervention Outcomes  Outcomes Reduced isolation;Awareness of support;Connection to spiritual care  Spiritual Care Plan  Spiritual Care Issues Still Outstanding No further spiritual care needs at this time (see row info)

## 2023-01-06 NOTE — Consult Note (Signed)
ANTICOAGULATION CONSULT NOTE - Initial Consult  Pharmacy Consult for Heparin infusion Indication: chest pain/ACS  Allergies  Allergen Reactions   Erythromycin Anaphylaxis and Swelling    Had eye swelling with erythromycin during a time when he had perf ear drum.  Tolerated azithromycin.   Patient Measurements: Height: 6\' 1"  (185.4 cm) Weight: 136.1 kg (300 lb) IBW/kg (Calculated) : 79.9 Heparin Dosing Weight: 111 kg  Vital Signs: Temp: 98.1 F (36.7 C) (05/13 1730) Temp Source: Oral (05/13 1730) BP: 244/174 (05/13 1730) Pulse Rate: 90 (05/13 1730)  Labs: Recent Labs    01/05/23 1535 01/05/23 1811 01/05/23 2111 01/05/23 2113 01/06/23 0409 01/06/23 0500 01/06/23 0744 01/06/23 1555 01/06/23 1750  HGB 15.1  --   --   --   --   --   --   --   --   HCT 47.5  --   --   --   --   --   --   --   --   PLT 233  --   --   --   --   --   --   --   --   LABPROT  --   --  14.3  --   --   --   --   --   --   INR  --   --  1.1  --   --   --   --   --   --   HEPARINUNFRC  --   --   --    < >  --  <0.10*  --  <0.10* <0.10*  CREATININE 1.14  --   --   --  1.05  --   --   --   --   TROPONINIHS 209* 241*  --   --   --   --  157*  --   --    < > = values in this interval not displayed.    Estimated Creatinine Clearance: 101.6 mL/min (by C-G formula based on SCr of 1.05 mg/dL).  Medical History: Past Medical History:  Diagnosis Date   CHF (congestive heart failure) (HCC)    COPD (chronic obstructive pulmonary disease) (HCC)    Diabetes mellitus without complication (HCC)    Hypertension    Neuropathy     Medications:  No AC prior to admission  Assessment: Patient admitted with increased shortness of breath and elevated troponin. PMH includes HFpEF, CAD, HTN, and DM. Pharmacy consulted to manage heparin infusion for ACS.  Results Date/Time aPTT/HL Comments 5/13@0500  HL < 0.10 Sub-therapeutic at 1300 un/hr ->1600 un/hr 5/13@1750  HL < 0.10 Sub-therapeutic at 1600  un/hr  Goal of Therapy:  Heparin level 0.3-0.7 units/ml Monitor platelets by anticoagulation protocol: Yes    Plan:  Confirmed with nurse the heparin infusion was continuously running and ordered repeat level which was still <0.10. Give 3300 units bolus x 1 Increase heparin infusion rate to 1900 units/hr Check anti-Xa level 6 hours after rate chnage and daily while on heparin Continue to monitor H&H and platelets  Bettey Costa, PharmD Clinical Pharmacist 01/06/2023 6:56 PM

## 2023-01-06 NOTE — Hospital Course (Addendum)
Taken from H&P.  Cody Alexander is a 67 y.o. 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 O2 sats with EMS on 4L was 86% and patient was placed on NRB at 15L for transport transitioning to Bipap on arrival.   ED course and data review: On arrival tachycardic to 133 and tachypneic to 36, O2 sat 98% on BiPAP.  BP 155/119. Labs: Troponin 209-241 and BNP 272.  WBC 22,000.  Glucose 261, potassium 3.3, urinalysis with moderate leukocyte esterase. EKG, showing sinus tachycardia at 114 with nonspecific ST-T wave changes. CTA chest showing no evidence of PE and no acute pulmonary parenchymal findings. CT renal stone study negative for obstructive uropathy.  Small amount of gas in the bladder presumably related to procedure and small bladder diverticula Chest x-ray cardiomegaly and mild interstitial edema.  Patient treated with chewable aspirin 324 started on cefepime for UTI, given a dose of Lasix 40 mg IV and started on a heparin infusion for NSTEMI.  5/13: Vital stable.  Able to wean back to 4 L.  Continue to have significant dysuria.  No chest pain this morning, troponin started trending down, peaked at 241, most likely demand ischemia.  Cardiology is on board. Antibiotic switched with ceftriaxone as he has an history of prior pansensitive E. coli UTI, urine cultures ordered as add-on. No history of any recent bladder procedure.

## 2023-01-06 NOTE — Consult Note (Signed)
Cody Alexander is a 67 y.o. male  [id number]  Primary Cardiologist: [facility] cardiology Reason for Consultation: chest pain  HPI: Patient is a 67 y.o. 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  Patient states he had not been feeling well for a few days. Yesterday he drove out of town with a friend, heavily exerted himself while walking around outside. Patient proceeded to drive himself home when he began to feel chest discomfort in the center of his chest, short of breath, diaphoretic, loss of bladder and bowel control. After patient arrived at home, EMS was called to bring him to the ED.   Review of Systems: negative unless otherwise noted above.    Past Medical History:  Diagnosis Date   CHF (congestive heart failure) (HCC)    COPD (chronic obstructive pulmonary disease) (HCC)    Diabetes mellitus without complication (HCC)    Hypertension    Neuropathy     (Not in a hospital admission)     aspirin EC  81 mg Oral Daily   atorvastatin  40 mg Oral Daily   [START ON 01/07/2023] fluticasone furoate-vilanterol  1 puff Inhalation Daily   furosemide  40 mg Intravenous Q12H   gabapentin  600 mg Oral TID   insulin aspart  0-15 Units Subcutaneous TID WC   insulin aspart  0-5 Units Subcutaneous QHS   insulin detemir  30 Units Subcutaneous Q2200   isosorbide dinitrate  30 mg Oral q morning   lisinopril  20 mg Oral Daily   magnesium oxide  400 mg Oral BID   metoprolol tartrate  25 mg Oral BID   potassium chloride  10 mEq Oral Daily   spironolactone  12.5 mg Oral Daily   tamsulosin  0.4 mg Oral Daily    Infusions:  ceFEPime (MAXIPIME) IV Stopped (01/06/23 0722)   heparin 1,600 Units/hr (01/06/23  4098)    Allergies  Allergen Reactions   Erythromycin Anaphylaxis and Swelling    Had eye swelling with erythromycin during a time when he had perf ear drum.  Tolerated azithromycin.    Social History   Socioeconomic History   Marital status: Single    Spouse name: Not on file   Number of children: Not on file   Years of education: Not on file   Highest education level: Not on file  Occupational History   Not on file  Tobacco Use   Smoking status: Former    Types: Cigarettes   Smokeless tobacco: Never  Vaping Use   Vaping Use: Never used  Substance and Sexual Activity   Alcohol use: No   Drug use: Never   Sexual activity: Not on file  Other Topics Concern   Not on file  Social History Narrative   Not on file   Social Determinants of Health   Financial Resource Strain: Not on file  Food Insecurity: No Food Insecurity (11/10/2022)   Hunger Vital Sign    Worried About Running Out of Food in the Last Year: Never true    Ran Out of Food in the Last Year: Never true  Transportation Needs: No Transportation Needs (11/10/2022)   PRAPARE - Administrator, Civil Service (Medical):  No    Lack of Transportation (Non-Medical): No  Physical Activity: Not on file  Stress: Not on file  Social Connections: Not on file  Intimate Partner Violence: Not At Risk (11/10/2022)   Humiliation, Afraid, Rape, and Kick questionnaire    Fear of Current or Ex-Partner: No    Emotionally Abused: No    Physically Abused: No    Sexually Abused: No    Family History  Problem Relation Age of Onset   Stroke Mother    Hypertension Father     PHYSICAL EXAM: Vitals:   01/06/23 0700 01/06/23 0722  BP: (!) 143/87   Pulse: 84   Resp: 20   Temp:  98.3 F (36.8 C)  SpO2: 94%      Intake/Output Summary (Last 24 hours) at 01/06/2023 0853 Last data filed at 01/06/2023 1610 Gross per 24 hour  Intake 200 ml  Output 850 ml  Net -650 ml    General:  Well appearing. No respiratory  difficulty HEENT: normal Neck: supple. no JVD. Carotids 2+ bilat; no bruits. No lymphadenopathy or thryomegaly appreciated. Cor: PMI nondisplaced. Regular rate & rhythm. No rubs, gallops or murmurs. Lungs: clear Abdomen: soft, nontender, nondistended. No hepatosplenomegaly. No bruits or masses. Good bowel sounds. Extremities: no cyanosis, clubbing, rash, edema Neuro: alert & oriented x 3, cranial nerves grossly intact. moves all 4 extremities w/o difficulty. Affect pleasant.  ECG: sinus tachycardia, HR 100 bpm, non-specific ST changes  Results for orders placed or performed during the hospital encounter of 01/05/23 (from the past 24 hour(s))  CBC with Differential     Status: Abnormal   Collection Time: 01/05/23  3:35 PM  Result Value Ref Range   WBC 22.9 (H) 4.0 - 10.5 K/uL   RBC 5.29 4.22 - 5.81 MIL/uL   Hemoglobin 15.1 13.0 - 17.0 g/dL   HCT 96.0 45.4 - 09.8 %   MCV 89.8 80.0 - 100.0 fL   MCH 28.5 26.0 - 34.0 pg   MCHC 31.8 30.0 - 36.0 g/dL   RDW 11.9 14.7 - 82.9 %   Platelets 233 150 - 400 K/uL   nRBC 0.0 0.0 - 0.2 %   Neutrophils Relative % 90 %   Neutro Abs 20.6 (H) 1.7 - 7.7 K/uL   Lymphocytes Relative 2 %   Lymphs Abs 0.5 (L) 0.7 - 4.0 K/uL   Monocytes Relative 7 %   Monocytes Absolute 1.6 (H) 0.1 - 1.0 K/uL   Eosinophils Relative 0 %   Eosinophils Absolute 0.0 0.0 - 0.5 K/uL   Basophils Relative 0 %   Basophils Absolute 0.1 0.0 - 0.1 K/uL   Immature Granulocytes 1 %   Abs Immature Granulocytes 0.12 (H) 0.00 - 0.07 K/uL  Basic metabolic panel     Status: Abnormal   Collection Time: 01/05/23  3:35 PM  Result Value Ref Range   Sodium 139 135 - 145 mmol/L   Potassium 3.3 (L) 3.5 - 5.1 mmol/L   Chloride 95 (L) 98 - 111 mmol/L   CO2 31 22 - 32 mmol/L   Glucose, Bld 261 (H) 70 - 99 mg/dL   BUN 19 8 - 23 mg/dL   Creatinine, Ser 5.62 0.61 - 1.24 mg/dL   Calcium 8.9 8.9 - 13.0 mg/dL   GFR, Estimated >86 >57 mL/min   Anion gap 13 5 - 15  Brain natriuretic peptide      Status: Abnormal   Collection Time: 01/05/23  3:35 PM  Result Value Ref Range   B  Natriuretic Peptide 272.1 (H) 0.0 - 100.0 pg/mL  Troponin I (High Sensitivity)     Status: Abnormal   Collection Time: 01/05/23  3:35 PM  Result Value Ref Range   Troponin I (High Sensitivity) 209 (HH) <18 ng/L  SARS Coronavirus 2 by RT PCR (hospital order, performed in [facility] hospital lab) *cepheid single result test* Anterior Nasal Swab     Status: None   Collection Time: 01/05/23  3:35 PM   Specimen: Anterior Nasal Swab  Result Value Ref Range   SARS Coronavirus 2 by RT PCR NEGATIVE NEGATIVE  Urinalysis, Routine w reflex microscopic -Urine, Clean Catch     Status: Abnormal   Collection Time: 01/05/23  4:52 PM  Result Value Ref Range   Color, Urine Ryane Canavan (A) YELLOW   APPearance CLOUDY (A) CLEAR   Specific Gravity, Urine 1.020 1.005 - 1.030   pH 5.5 5.0 - 8.0   Glucose, UA 500 (A) NEGATIVE mg/dL   Hgb urine dipstick LARGE (A) NEGATIVE   Bilirubin Urine NEGATIVE NEGATIVE   Ketones, ur TRACE (A) NEGATIVE mg/dL   Protein, ur >161 (A) NEGATIVE mg/dL   Nitrite NEGATIVE NEGATIVE   Leukocytes,Ua MODERATE (A) NEGATIVE   RBC / HPF >50 0 - 5 RBC/hpf   WBC, UA >50 0 - 5 WBC/hpf   Bacteria, UA RARE (A) NONE SEEN   Squamous Epithelial / HPF 0-5 0 - 5 /HPF   WBC Clumps PRESENT    Mucus PRESENT   Troponin I (High Sensitivity)     Status: Abnormal   Collection Time: 01/05/23  6:11 PM  Result Value Ref Range   Troponin I (High Sensitivity) 241 (HH) <18 ng/L  TSH     Status: None   Collection Time: 01/05/23  9:11 PM  Result Value Ref Range   TSH 0.389 0.350 - 4.500 uIU/mL  Protime-INR     Status: None   Collection Time: 01/05/23  9:11 PM  Result Value Ref Range   Prothrombin Time 14.3 11.4 - 15.2 seconds   INR 1.1 0.8 - 1.2  Heparin level (unfractionated)     Status: Abnormal   Collection Time: 01/05/23  9:13 PM  Result Value Ref Range   Heparin Unfractionated <0.10 (L) 0.30 - 0.70 IU/mL  CBG  monitoring, ED     Status: Abnormal   Collection Time: 01/05/23 10:07 PM  Result Value Ref Range   Glucose-Capillary 276 (H) 70 - 99 mg/dL  Basic metabolic panel     Status: Abnormal   Collection Time: 01/06/23  4:09 AM  Result Value Ref Range   Sodium 139 135 - 145 mmol/L   Potassium 3.6 3.5 - 5.1 mmol/L   Chloride 93 (L) 98 - 111 mmol/L   CO2 33 (H) 22 - 32 mmol/L   Glucose, Bld 166 (H) 70 - 99 mg/dL   BUN 20 8 - 23 mg/dL   Creatinine, Ser 0.96 0.61 - 1.24 mg/dL   Calcium 8.3 (L) 8.9 - 10.3 mg/dL   GFR, Estimated >04 >54 mL/min   Anion gap 13 5 - 15  Heparin level (unfractionated)     Status: Abnormal   Collection Time: 01/06/23  5:00 AM  Result Value Ref Range   Heparin Unfractionated <0.10 (L) 0.30 - 0.70 IU/mL  CBG monitoring, ED     Status: Abnormal   Collection Time: 01/06/23  7:18 AM  Result Value Ref Range   Glucose-Capillary 162 (H) 70 - 99 mg/dL   CT Renal Stone Study  Result  Date: 01/05/2023 CLINICAL DATA:  Hematuria. EXAM: CT ABDOMEN AND PELVIS WITHOUT CONTRAST TECHNIQUE: Multidetector CT imaging of the abdomen and pelvis was performed following the standard protocol without IV contrast. RADIATION DOSE REDUCTION: This exam was performed according to the departmental dose-optimization program which includes automated exposure control, adjustment of the mA and/or kV according to patient size and/or use of iterative reconstruction technique. COMPARISON:  CT angio chest same date. FINDINGS: Lower chest: Mild bibasilar scarring. Hepatobiliary: No focal hepatic lesion. Normal gallbladder. No biliary duct dilatation. Common bile duct is normal. Pancreas: Pancreas is normal. No ductal dilatation. No pancreatic inflammation. Spleen: Normal spleen Adrenals/urinary tract: Mild thickening LEFT adrenal gland suggests hyperplasia. There is contrast in the collecting system related to CTA same day. No nephrolithiasis identified. No ureterolithiasis. The ureters are opacified. No obstructive  uropathy. Small amount of gas in bladder related procedure presumably. Several small diverticula of the bladder. Stomach/Bowel: Stomach, small bowel, appendix, and cecum are normal. The colon and rectosigmoid colon are normal. Vascular/Lymphatic: Abdominal aorta is normal caliber. No periportal or retroperitoneal adenopathy. No pelvic adenopathy. Reproductive: Prostate unremarkable Other: No free fluid. Musculoskeletal: No aggressive osseous lesion. IMPRESSION: 1. No nephrolithiasis, ureterolithiasis, or obstructive uropathy. Collecting systems filled with contrast. 2. Small amount of gas in the bladder presumably related to procedure. 3. Small bladder diverticula. Electronically Signed   By: Genevive Bi M.D.   On: 01/05/2023 21:05   CT Angio Chest PE W and/or Wo Contrast  Result Date: 01/05/2023 CLINICAL DATA:  Short of breath.  Home oxygen. EXAM: CT ANGIOGRAPHY CHEST WITH CONTRAST TECHNIQUE: Multidetector CT imaging of the chest was performed using the standard protocol during bolus administration of intravenous contrast. Multiplanar CT image reconstructions and MIPs were obtained to evaluate the vascular anatomy. RADIATION DOSE REDUCTION: This exam was performed according to the departmental dose-optimization program which includes automated exposure control, adjustment of the mA and/or kV according to patient size and/or use of iterative reconstruction technique. CONTRAST:  75mL OMNIPAQUE IOHEXOL 350 MG/ML SOLN COMPARISON:  CT 11/09/2022 FINDINGS: Cardiovascular: No filling defects within the pulmonary arteries to suggest acute pulmonary embolism. Mediastinum/Nodes: No axillary or supraclavicular adenopathy. No mediastinal or hilar adenopathy. No pericardial fluid. Esophagus normal. Lungs/Pleura: No pulmonary infarction. No pneumonia. No pleural fluid. No pneumothorax Upper Abdomen: Limited view of the liver, kidneys, pancreas are unremarkable. Normal adrenal glands. Musculoskeletal: No acute osseous  abnormality. Subcutaneous cyst in the central upper back measures 4 cm Review of the MIP images confirms the above findings. IMPRESSION: 1. No evidence acute pulmonary embolism. 2. No acute pulmonary parenchymal findings. Electronically Signed   By: Genevive Bi M.D.   On: 01/05/2023 18:05   DG Chest Portable 1 View  Result Date: 01/05/2023 CLINICAL DATA:  Short of breath, respiratory distress EXAM: PORTABLE CHEST 1 VIEW COMPARISON:  None Available. FINDINGS: Stable enlarged cardiac silhouette. There is central venous congestion mild interstitial edema pattern. No pleural fluid. No focal consolidation. No pneumothorax. IMPRESSION: Cardiomegaly and mild interstitial edema. Electronically Signed   By: Genevive Bi M.D.   On: 01/05/2023 16:28     ASSESSMENT AND PLAN: Patient is a 67 y.o. male with medical history significant for combined CHF with recovered EF (EF 65% G1 DD 07/22/2022), COPD on home O2 at 4L, CAD s/p stent x1. Patient complaining of chest discomfort center of his chest, worsening shortness of breath. Troponin levels [phone]. Echo 10/2022 EF 65-70%, grade I DD, normal wall motion. Agree with IV Lasix. Continue spironolactone, metoprolol and ACE. Troponin levels  trending down, likely elevated due to demand ischemia. Will continue to follow.   Museum/gallery conservator FNP-C

## 2023-01-07 ENCOUNTER — Inpatient Hospital Stay (HOSPITAL_COMMUNITY)
Admit: 2023-01-07 | Discharge: 2023-01-07 | Disposition: A | Discharge status: Home / Self Care | Payer: 59 | Attending: Internal Medicine | Admitting: Internal Medicine

## 2023-01-07 DIAGNOSIS — I214 Non-ST elevation (NSTEMI) myocardial infarction: Secondary | ICD-10-CM

## 2023-01-07 DIAGNOSIS — R0603 Acute respiratory distress: Secondary | ICD-10-CM

## 2023-01-07 DIAGNOSIS — R079 Chest pain, unspecified: Secondary | ICD-10-CM | POA: Diagnosis not present

## 2023-01-07 LAB — BASIC METABOLIC PANEL
Anion gap: 8 (ref 5–15)
BUN: 21 mg/dL (ref 8–23)
CO2: 37 mmol/L — ABNORMAL HIGH (ref 22–32)
Calcium: 8.2 mg/dL — ABNORMAL LOW (ref 8.9–10.3)
Chloride: 92 mmol/L — ABNORMAL LOW (ref 98–111)
Creatinine, Ser: 1.01 mg/dL (ref 0.61–1.24)
GFR, Estimated: >60 mL/min (ref >60)
Glucose, Bld: 176 mg/dL — ABNORMAL HIGH (ref 70–99)
Potassium: 3.7 mmol/L (ref 3.5–5.1)
Sodium: 137 mmol/L (ref 135–145)

## 2023-01-07 LAB — ECHOCARDIOGRAM COMPLETE
AR max vel: 3.72 cm2
AV Area VTI: 3.73 cm2
AV Area mean vel: 3.95 cm2
AV Mean grad: 4 mmHg
AV Peak grad: 7.6 mmHg
Ao pk vel: 1.38 m/s
Area-P 1/2: 2.92 cm2
Height: 73 in
MV VTI: 5.45 cm2
S' Lateral: 2.8 cm
Weight: 4800 oz

## 2023-01-07 LAB — PROTIME-INR
INR: 1 (ref 0.8–1.2)
Prothrombin Time: 12.8 seconds (ref 11.4–15.2)

## 2023-01-07 LAB — BLOOD GAS, ARTERIAL
Acid-Base Excess: 17.5 mmol/L — ABNORMAL HIGH (ref 0.0–2.0)
Bicarbonate: 48.7 mmol/L — ABNORMAL HIGH (ref 20.0–28.0)
O2 Content: 4 L/min
O2 Saturation: 97.5 %
Patient temperature: 37
pCO2 arterial: 99 mmHg (ref 32–48)
pH, Arterial: 7.3 — ABNORMAL LOW (ref 7.35–7.45)
pO2, Arterial: 84 mmHg (ref 83–108)

## 2023-01-07 LAB — CBC
HCT: 42 % (ref 39.0–52.0)
Hemoglobin: 12.7 g/dL — ABNORMAL LOW (ref 13.0–17.0)
MCH: 28.9 pg (ref 26.0–34.0)
MCHC: 30.2 g/dL (ref 30.0–36.0)
MCV: 95.5 fL (ref 80.0–100.0)
Platelets: 195 10*3/uL (ref 150–400)
RBC: 4.4 MIL/uL (ref 4.22–5.81)
RDW: 13.1 % (ref 11.5–15.5)
WBC: 6.9 10*3/uL (ref 4.0–10.5)
nRBC: 0 % (ref 0.0–0.2)

## 2023-01-07 LAB — CBG MONITORING, ED
Glucose-Capillary: 135 mg/dL — ABNORMAL HIGH (ref 70–99)
Glucose-Capillary: 152 mg/dL — ABNORMAL HIGH (ref 70–99)

## 2023-01-07 LAB — D-DIMER, QUANTITATIVE: D-Dimer, Quant: 0.51 ug/mL-FEU — ABNORMAL HIGH (ref 0.00–0.50)

## 2023-01-07 LAB — URINE CULTURE

## 2023-01-07 LAB — GLUCOSE, CAPILLARY
Glucose-Capillary: 123 mg/dL — ABNORMAL HIGH (ref 70–99)
Glucose-Capillary: 171 mg/dL — ABNORMAL HIGH (ref 70–99)

## 2023-01-07 LAB — TROPONIN I (HIGH SENSITIVITY)
Troponin I (High Sensitivity): 98 ng/L — ABNORMAL HIGH (ref <18)
Troponin I (High Sensitivity): 85 ng/L — ABNORMAL HIGH (ref <18)

## 2023-01-07 LAB — APTT: aPTT: 54 seconds — ABNORMAL HIGH (ref 24–36)

## 2023-01-07 LAB — HEPARIN LEVEL (UNFRACTIONATED)
Heparin Unfractionated: <0.1 IU/mL — ABNORMAL LOW (ref 0.30–0.70)
Heparin Unfractionated: 0.2 IU/mL — ABNORMAL LOW (ref 0.30–0.70)

## 2023-01-07 MED ORDER — OXYCODONE-ACETAMINOPHEN 5-325 MG PO TABS
1.0000 | ORAL_TABLET | ORAL | Status: DC | PRN
  Administered 2023-01-07 (×2): 1 via ORAL
  Filled 2023-01-07 (×2): qty 1

## 2023-01-07 MED ORDER — HEPARIN BOLUS VIA INFUSION
3300.0000 [IU] | Freq: Once | INTRAVENOUS | Status: AC
  Administered 2023-01-07: 3300 [IU] via INTRAVENOUS
  Filled 2023-01-07: qty 3300

## 2023-01-07 NOTE — ED Notes (Signed)
RT in room to assess pt - okay per RT

## 2023-01-07 NOTE — ED Notes (Signed)
Breakfast meal tray given at this time.  

## 2023-01-07 NOTE — Progress Notes (Signed)
*  PRELIMINARY RESULTS* Echocardiogram 2D Echocardiogram has been performed.  Carolyne Fiscal 01/07/2023, 11:22 AM

## 2023-01-07 NOTE — ED Notes (Signed)
Report received from Dorian, RN 

## 2023-01-07 NOTE — Consult Note (Signed)
ANTICOAGULATION CONSULT NOTE  Pharmacy Consult for Heparin infusion Indication: chest pain/ACS  Allergies  Allergen Reactions   Erythromycin Anaphylaxis and Swelling    Had eye swelling with erythromycin during a time when he had perf ear drum.  Tolerated azithromycin.   Patient Measurements: Height: 6\' 1"  (185.4 cm) Weight: 136.1 kg (300 lb) IBW/kg (Calculated) : 79.9 Heparin Dosing Weight: 111 kg  Vital Signs: Temp: 98.1 F (36.7 C) (05/13 2200) Temp Source: Oral (05/13 2200) BP: 114/78 (05/14 0230) Pulse Rate: 64 (05/14 0130)  Labs: Recent Labs    01/05/23 1535 01/05/23 1811 01/05/23 2111 01/05/23 2113 01/06/23 0409 01/06/23 0500 01/06/23 0744 01/06/23 1555 01/06/23 1750 01/07/23 0221  HGB 15.1  --   --   --   --   --   --   --   --   --   HCT 47.5  --   --   --   --   --   --   --   --   --   PLT 233  --   --   --   --   --   --   --   --   --   LABPROT  --   --  14.3  --   --   --   --   --   --   --   INR  --   --  1.1  --   --   --   --   --   --   --   HEPARINUNFRC  --   --   --    < >  --    < >  --  <0.10* <0.10* <0.10*  CREATININE 1.14  --   --   --  1.05  --   --   --   --   --   TROPONINIHS 209* 241*  --   --   --   --  157*  --   --   --    < > = values in this interval not displayed.    Estimated Creatinine Clearance: 101.6 mL/min (by C-G formula based on SCr of 1.05 mg/dL).  Medical History: Past Medical History:  Diagnosis Date   CHF (congestive heart failure) (HCC)    COPD (chronic obstructive pulmonary disease) (HCC)    Diabetes mellitus without complication (HCC)    Hypertension    Neuropathy     Medications:  No AC prior to admission  Assessment: Patient admitted with increased shortness of breath and elevated troponin. PMH includes HFpEF, CAD, HTN, and DM. Pharmacy consulted to manage heparin infusion for ACS.  Results Date/Time aPTT/HL Comments 5/13@0500  HL < 0.10 Sub-therapeutic at 1300 un/hr ->1600 un/hr 5/13@1750  HL <  0.10 Sub-therapeutic at 1600 un/hr 5/14 0221 HL < 0.10 Subtherapeutic  Goal of Therapy:  Heparin level 0.3-0.7 units/ml Monitor platelets by anticoagulation protocol: Yes    Plan:  Give 3300 units bolus x 1 Increase heparin infusion rate to 2200 units/hr Recheck anti-Xa level 6 hours after rate change Will check aPTT w/ next HL for correlation Continue to monitor H&H and platelets  Otelia Sergeant, PharmD, [facility] 01/07/2023 2:59 AM

## 2023-01-07 NOTE — Progress Notes (Signed)
EPIC Chatted Provider requesting labs- patient is ease to arousal and able to answer yes/no questions-however patient is not able to answer orientation questions- patient trails off with eyes closed- patient is visible jerking off/on- notified provider

## 2023-01-07 NOTE — Progress Notes (Signed)
Progress Note   Patient: Cody Alexander UJW:[id number] DOB: 1956/05/28 DOA: 01/05/2023     2 DOS: the patient was seen and examined on 01/07/2023   Brief hospital course: Adriano Kaba is a 67 y.o. male with medical history significant for Combined CHF with recovered EF (EF 65%  07/22/2022), COPD on home O2 at 4L, CAD s/p stent x1, HTN, T2DM , class III obesity, OSA with several hospitalizations for CHF most recently 3/16 - 11/15/22 who presents to the ED by EMS with respiratory distress, diaphoretic and with chest pain. O2 sats with EMS on 4L was 86% and patient was placed on NRB at 15L for transport transitioning to Bipap on arrival.  Given elevated troponin, respiratory distress hospitalist was called for admission  Assessment and Plan:  Acute on chronic diastolic CHF (congestive heart failure) (HCC) Clinically volume overloaded, no overt chest x-ray findings, BNP elevated Continue IV Lasix, follow urine output Continue spironolactone, metoprolol and lisinopril Follow-up repeat echo.  Last EF 65% with G1 DD 06/2022 Cardiology consult  Acute on chronic respiratory failure with hypoxia Secondary to above, improving with diuresis  Baseline oxygen requirements 4L [place] -continue to wean as tolerated  UTI secondary to E. coli Complicated by history of BPH with LUTS History of kidney stones Abnormal UA, symptoms of dysuria with preliminary culture positive for E. coli  Continue ceftriaxone x 3 days, may benefit from prolonged antibiotic course given history of kidney stones which may be nidus for ongoing infection **Blood cultures not collected previously, low yield at this point given patient has had 48 hours of IV antibiotics -  would need to rule out concurrent bacteremia if not clinically improving in the next 48 hours  NSTEMI secondary to acute hypoxia History of CAD s/p stent Patient with chest pain(now resolved) Loaded with aspirin at intake Troponin elevated but downtrending  appropriately, without EKG changes  Cardiology following, continue heparin per their recommendations Continue home medications including isosorbide, metoprolol, lisinopril, atorvastatin  Chronic obstructive pulmonary disease (COPD) (HCC) Acute exacerbation not suspected Continue home Symbicort/formulary substitution DuoNebs as needed  Essential hypertension Continue home meds  Type 2 diabetes mellitus with hyperglycemia, with long-term current use of insulin (HCC), uncontrolled Continue home basal insulin Sliding scale insulin coverage Most recent A1c 11.2 (March 24)  Hypokalemia In the setting of diuretics, continue oral repletion as appropriate  Benign prostatic hyperplasia with urinary hesitancy/LUTS -Continue home Flomax  OSA (obstructive sleep apnea) Currently on BiPAP  Obesity, Class III, BMI 40-49.9 (morbid obesity) (HCC) Complicating factor to overall prognosis and care Subjective: Patient continued to have significant dysuria.  No chest pain this morning.  Physical Exam: Vitals:   01/07/23 0530 01/07/23 0600 01/07/23 0630 01/07/23 0700  BP: 124/88 121/87 106/81 129/86  Pulse: 77 80 79 68  Resp: 17 17 19  (!) 21  Temp:      TempSrc:      SpO2: 96% 100% 100% 100%  Weight:      Height:       General.  Obese gentleman, in no acute distress. Pulmonary.  Lungs clear bilaterally, normal respiratory effort. CV.  Regular rate and rhythm, no JVD, rub or murmur. Abdomen.  Soft, nontender, nondistended, BS positive. CNS.  Alert and oriented .  No focal neurologic deficit. Extremities.  Bilateral LE edema, no cyanosis, pulses intact and symmetrical.  Data Reviewed: Prior data reviewed  Family Communication: Discussed with patient  Disposition: Status is: Inpatient Remains inpatient appropriate because: Severity of illness  Planned Discharge Destination: Home  DVT prophylaxis.  Heparin Time spent: 50 minutes  Author: Carma Leaven DO 01/07/2023 7:45  AM  For on call review [email or web address].

## 2023-01-07 NOTE — ED Notes (Signed)
Pt has a hx of CHF and is on IV lasix, pt is on strict I&Os. External male purewick placed on pt.

## 2023-01-07 NOTE — ED Notes (Signed)
Pt removing Cpap, respiratory called to assess

## 2023-01-07 NOTE — Progress Notes (Signed)
       CROSS COVER NOTE  NAME: Cody Alexander MRN: [id number] DOB : 12/15/1955    HPI/Events of Note   Report:admitted 5/12 for NSTEMI and respiratory distress- noncompliant with medications and home oxygen- patient is not mentating appropriately-able to answer yes/no questions but not staying alert for open ended orientation questions No ABG on file- requesting STAT ABG and Ammonia level labs for now please he is currently on 4L nasal cannula, telemetry and vitals appear stable but patient is not presenting as such-would like ABG to rule out hypoxia issues-patient has past for polysubstance use and states does not drink alcohol - however presents as possible   On review of chart: H&P and last progress notes reviewed.  Admitted with NSTEMI, CHF exacerbation and UTI, with history of morbid obesity and OSA.  Presentation was with chest pain and shortness of breath as well as flank pain but reports of having passed some kidney stones.  Was seen by cardiology.  Had an unremarkable renal stone study.    Assessment and  Interventions   Assessment: Acute encephalopathy with concern for hypercapnia  Plan: Will get a rainbow of labs including ABG, troponin, BNP, BMP, D-dimer although already on heparin and CBC and will also get EKG.  Will get a bladder scan as well as he had some urinary obstructive symptoms on admission per H&P. Acute respiratory failure with hypoxia and hypercapnia -Arterial Blood Gas result:  pO2 84; pCO2 99; pH 7.3;  HCO3 48.7, %O2 Sat 97.5. -Patient refusing BiPAP so HFNC ordered.  Discussed with RT X X

## 2023-01-07 NOTE — ED Notes (Signed)
Pt pulled up in bed. Assisted with eating. Pt has tremors and was getting frustrated with trying to eat.

## 2023-01-07 NOTE — ED Notes (Signed)
Pt making inappropriate remarks to this RN - repeatedly asking this RN to kiss, getting this RN to come in room to pick up remote and referencing RN bending over. RT in room with this RN to place pt on CPAP.

## 2023-01-07 NOTE — Progress Notes (Signed)
SUBJECTIVE: Patient is a 67 y.o. 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  Patient states he had not been feeling well for a few days. On Sunday. he drove out of town with a friend, heavily exerted himself while walking around outside. Patient proceeded to drive himself home when he began to feel chest discomfort in the center of his chest, short of breath, diaphoretic, loss of bladder and bowel control. After patient arrived at home, EMS was called to bring him to the ED.   Patient is established with [facility] cardiology, is non-compliant with follow up appointments.   Vitals:   01/07/23 0600 01/07/23 0630 01/07/23 0700 01/07/23 0826  BP: 121/87 106/81 129/86 (!) 108/92  Pulse: 80 79 68 83  Resp: 17 19 (!) 21 16  Temp:    97.8 F (36.6 C)  TempSrc:    Oral  SpO2: 100% 100% 100% 95%  Weight:      Height:        Intake/Output Summary (Last 24 hours) at 01/07/2023 0906 Last data filed at 01/07/2023 0644 Gross per 24 hour  Intake 622.56 ml  Output 1350 ml  Net -727.44 ml    LABS: Basic Metabolic Panel: Recent Labs    01/06/23 0409 01/07/23 0221  NA 139 137  K 3.6 3.7  CL 93* 92*  CO2 33* 37*  GLUCOSE 166* 176*  BUN 20 21  CREATININE 1.05 1.01  CALCIUM 8.3* 8.2*   Liver Function Tests: No results for input(s): "AST", "ALT", "ALKPHOS", "BILITOT", "PROT", "ALBUMIN" in the last 72 hours. No results for input(s): "LIPASE", "AMYLASE" in the last 72 hours. CBC: Recent Labs    01/05/23 1535  WBC 22.9*  NEUTROABS 20.6*  HGB 15.1  HCT 47.5  MCV 89.8  PLT 233   Cardiac Enzymes: No results for input(s): "CKTOTAL", "CKMB", "CKMBINDEX", "TROPONINI" in the last 72 hours. BNP: Invalid input(s):  "POCBNP" D-Dimer: No results for input(s): "DDIMER" in the last 72 hours. Hemoglobin A1C: No results for input(s): "HGBA1C" in the last 72 hours. Fasting Lipid Panel: No results for input(s): "CHOL", "HDL", "LDLCALC", "TRIG", "CHOLHDL", "LDLDIRECT" in the last 72 hours. Thyroid Function Tests: Recent Labs    01/05/23 2111  TSH 0.389   Anemia Panel: No results for input(s): "VITAMINB12", "FOLATE", "FERRITIN", "TIBC", "IRON", "RETICCTPCT" in the last 72 hours.   PHYSICAL EXAM General: Well developed, well nourished, in no acute distress HEENT:  Normocephalic and atramatic Neck:  No JVD.  Lungs: Clear bilaterally to auscultation and percussion. Heart: HRRR . Normal S1 and S2 without gallops or murmurs.  Abdomen: Bowel sounds are positive, abdomen soft and non-tender  Msk:  Back normal, normal gait. Normal strength and tone for age. Extremities: No clubbing, cyanosis or edema.   Neuro: Alert and oriented X 3. Psych:  Good affect, responds appropriately  TELEMETRY: sinus rhythm, HR 96 bpm  ASSESSMENT AND PLAN:  Patient is a 67 y.o. male with medical history significant for combined CHF with recovered EF (EF 65% G1 DD 07/22/2022), COPD on home O2 at 4L, CAD s/p stent x1. Patient complaining of chest discomfort center of his chest, worsening shortness of breath. Troponin levels 209>241>157>98>85. Echo 10/2022 EF 65-70%, grade I DD,  normal wall motion. Agree with IV Lasix. Continue spironolactone, metoprolol and ACE. Troponin levels trending down, likely elevated due to demand ischemia. Will continue to follow.    ICD-10-CM   1. Respiratory distress  R06.03     2. Congestive heart failure, unspecified HF chronicity, unspecified heart failure type (HCC)  I50.9     3. Lower urinary tract infection  N39.0       Principal Problem:   NSTEMI (non-ST elevated myocardial infarction) (HCC) Active Problems:   Chronic obstructive pulmonary disease (COPD) (HCC)   Type 2 diabetes mellitus with  hyperglycemia, with long-term current use of insulin (HCC)   Essential hypertension   Obesity, Class III, BMI 40-49.9 (morbid obesity) (HCC)   Acute on chronic respiratory failure with hypoxia (HCC)   Acute on chronic diastolic CHF (congestive heart failure) (HCC)   OSA (obstructive sleep apnea)   Benign prostatic hyperplasia with urinary hesitancy   Urinary tract infection   Hypokalemia   History of kidney stones    October Peery, FNP-C 01/07/2023 9:06 AM

## 2023-01-08 ENCOUNTER — Inpatient Hospital Stay: Payer: 59

## 2023-01-08 DIAGNOSIS — R0603 Acute respiratory distress: Secondary | ICD-10-CM | POA: Diagnosis not present

## 2023-01-08 DIAGNOSIS — R4182 Altered mental status, unspecified: Secondary | ICD-10-CM | POA: Diagnosis not present

## 2023-01-08 DIAGNOSIS — R569 Unspecified convulsions: Secondary | ICD-10-CM | POA: Diagnosis not present

## 2023-01-08 DIAGNOSIS — I214 Non-ST elevation (NSTEMI) myocardial infarction: Secondary | ICD-10-CM | POA: Diagnosis not present

## 2023-01-08 DIAGNOSIS — R079 Chest pain, unspecified: Secondary | ICD-10-CM | POA: Diagnosis not present

## 2023-01-08 LAB — GLUCOSE, CAPILLARY
Glucose-Capillary: 216 mg/dL — ABNORMAL HIGH (ref 70–99)
Glucose-Capillary: 167 mg/dL — ABNORMAL HIGH (ref 70–99)
Glucose-Capillary: 180 mg/dL — ABNORMAL HIGH (ref 70–99)
Glucose-Capillary: 195 mg/dL — ABNORMAL HIGH (ref 70–99)
Glucose-Capillary: 168 mg/dL — ABNORMAL HIGH (ref 70–99)
Glucose-Capillary: 163 mg/dL — ABNORMAL HIGH (ref 70–99)
Glucose-Capillary: 154 mg/dL — ABNORMAL HIGH (ref 70–99)

## 2023-01-08 LAB — BASIC METABOLIC PANEL
Anion gap: 9 (ref 5–15)
Anion gap: 8 (ref 5–15)
BUN: 23 mg/dL (ref 8–23)
BUN: 26 mg/dL — ABNORMAL HIGH (ref 8–23)
CO2: 38 mmol/L — ABNORMAL HIGH (ref 22–32)
CO2: 38 mmol/L — ABNORMAL HIGH (ref 22–32)
Calcium: 8.5 mg/dL — ABNORMAL LOW (ref 8.9–10.3)
Calcium: 8.7 mg/dL — ABNORMAL LOW (ref 8.9–10.3)
Chloride: 89 mmol/L — ABNORMAL LOW (ref 98–111)
Chloride: 90 mmol/L — ABNORMAL LOW (ref 98–111)
Creatinine, Ser: 1.02 mg/dL (ref 0.61–1.24)
Creatinine, Ser: 1.27 mg/dL — ABNORMAL HIGH (ref 0.61–1.24)
GFR, Estimated: >60 mL/min (ref >60)
GFR, Estimated: >60 mL/min (ref >60)
Glucose, Bld: 182 mg/dL — ABNORMAL HIGH (ref 70–99)
Glucose, Bld: 219 mg/dL — ABNORMAL HIGH (ref 70–99)
Potassium: 3.9 mmol/L (ref 3.5–5.1)
Potassium: 4.1 mmol/L (ref 3.5–5.1)
Sodium: 136 mmol/L (ref 135–145)
Sodium: 136 mmol/L (ref 135–145)

## 2023-01-08 LAB — BLOOD GAS, VENOUS
Acid-Base Excess: 16.4 mmol/L — ABNORMAL HIGH (ref 0.0–2.0)
Bicarbonate: 46.4 mmol/L — ABNORMAL HIGH (ref 20.0–28.0)
Delivery systems: POSITIVE
FIO2: 28 %
Mechanical Rate: 12
O2 Saturation: 90 %
Patient temperature: 37
pCO2, Ven: 84 mmHg (ref 44–60)
pH, Ven: 7.35 (ref 7.25–7.43)
pO2, Ven: 57 mmHg — ABNORMAL HIGH (ref 32–45)

## 2023-01-08 LAB — BLOOD GAS, ARTERIAL
Acid-Base Excess: 16.2 mmol/L — ABNORMAL HIGH (ref 0.0–2.0)
Acid-Base Excess: 18.4 mmol/L — ABNORMAL HIGH (ref 0.0–2.0)
Bicarbonate: 46.4 mmol/L — ABNORMAL HIGH (ref 20.0–28.0)
Bicarbonate: 45.4 mmol/L — ABNORMAL HIGH (ref 20.0–28.0)
Delivery systems: POSITIVE
FIO2: 50 %
MECHVT: 620 mL
Mechanical Rate: 18
O2 Content: 4 L/min
O2 Saturation: 77.2 %
O2 Saturation: 94.7 %
PEEP: 8 cmH2O
Patient temperature: 37
Patient temperature: 37
pCO2 arterial: 86 mmHg (ref 32–48)
pCO2 arterial: 61 mmHg — ABNORMAL HIGH (ref 32–48)
pH, Arterial: 7.34 — ABNORMAL LOW (ref 7.35–7.45)
pH, Arterial: 7.48 — ABNORMAL HIGH (ref 7.35–7.45)
pO2, Arterial: 45 mmHg — ABNORMAL LOW (ref 83–108)
pO2, Arterial: 61 mmHg — ABNORMAL LOW (ref 83–108)

## 2023-01-08 LAB — MAGNESIUM: Magnesium: 2.3 mg/dL (ref 1.7–2.4)

## 2023-01-08 LAB — URINE DRUG SCREEN, QUALITATIVE (ARMC ONLY)
Amphetamines, Ur Screen: POSITIVE — AB
Barbiturates, Ur Screen: NOT DETECTED
Benzodiazepine, Ur Scrn: NOT DETECTED
Cannabinoid 50 Ng, Ur ~~LOC~~: POSITIVE — AB
Cocaine Metabolite,Ur ~~LOC~~: NOT DETECTED
MDMA (Ecstasy)Ur Screen: NOT DETECTED
Methadone Scn, Ur: NOT DETECTED
Opiate, Ur Screen: NOT DETECTED
Phencyclidine (PCP) Ur S: NOT DETECTED
Tricyclic, Ur Screen: NOT DETECTED

## 2023-01-08 LAB — CULTURE, RESPIRATORY W GRAM STAIN

## 2023-01-08 LAB — CBC
HCT: 44.4 % (ref 39.0–52.0)
Hemoglobin: 13.1 g/dL (ref 13.0–17.0)
MCH: 28.2 pg (ref 26.0–34.0)
MCHC: 29.5 g/dL — ABNORMAL LOW (ref 30.0–36.0)
MCV: 95.7 fL (ref 80.0–100.0)
Platelets: 193 10*3/uL (ref 150–400)
RBC: 4.64 MIL/uL (ref 4.22–5.81)
RDW: 13.3 % (ref 11.5–15.5)
WBC: 7.3 10*3/uL (ref 4.0–10.5)
nRBC: 0 % (ref 0.0–0.2)

## 2023-01-08 LAB — TROPONIN I (HIGH SENSITIVITY)
Troponin I (High Sensitivity): 71 ng/L — ABNORMAL HIGH (ref <18)
Troponin I (High Sensitivity): 75 ng/L — ABNORMAL HIGH (ref <18)
Troponin I (High Sensitivity): 76 ng/L — ABNORMAL HIGH (ref <18)
Troponin I (High Sensitivity): 79 ng/L — ABNORMAL HIGH (ref <18)
Troponin I (High Sensitivity): 82 ng/L — ABNORMAL HIGH (ref <18)
Troponin I (High Sensitivity): 84 ng/L — ABNORMAL HIGH (ref <18)

## 2023-01-08 LAB — HEMOGLOBIN A1C
Hgb A1c MFr Bld: 9.4 % — ABNORMAL HIGH (ref 4.8–5.6)
Mean Plasma Glucose: 223.08 mg/dL

## 2023-01-08 LAB — URINE CULTURE: Culture: >=100000 — AB

## 2023-01-08 LAB — LACTIC ACID, PLASMA: Lactic Acid, Venous: 1.7 mmol/L (ref 0.5–1.9)

## 2023-01-08 LAB — CK: Total CK: 917 U/L — ABNORMAL HIGH (ref 49–397)

## 2023-01-08 LAB — AMMONIA: Ammonia: 45 umol/L — ABNORMAL HIGH (ref 9–35)

## 2023-01-08 LAB — POTASSIUM: Potassium: 3.8 mmol/L (ref 3.5–5.1)

## 2023-01-08 LAB — PHOSPHORUS: Phosphorus: 2.1 mg/dL — ABNORMAL LOW (ref 2.5–4.6)

## 2023-01-08 LAB — LIPOPROTEIN A (LPA): Lipoprotein (a): <8.4 nmol/L (ref <75.0)

## 2023-01-08 LAB — BRAIN NATRIURETIC PEPTIDE: B Natriuretic Peptide: 59.7 pg/mL (ref 0.0–100.0)

## 2023-01-08 MED ORDER — NOREPINEPHRINE 4 MG/250ML-% IV SOLN
2.0000 ug/min | INTRAVENOUS | Status: DC
  Administered 2023-01-08: 2 ug/min via INTRAVENOUS
  Administered 2023-01-09: 4 ug/min via INTRAVENOUS
  Filled 2023-01-08 (×2): qty 250

## 2023-01-08 MED ORDER — FENTANYL 2500MCG IN NS 250ML (10MCG/ML) PREMIX INFUSION
INTRAVENOUS | Status: AC
  Administered 2023-01-08: 50 ug/h via INTRAVENOUS
  Filled 2023-01-08: qty 250

## 2023-01-08 MED ORDER — ETOMIDATE 2 MG/ML IV SOLN
INTRAVENOUS | Status: AC
  Administered 2023-01-08: 20 mg
  Filled 2023-01-08: qty 20

## 2023-01-08 MED ORDER — INSULIN DETEMIR 100 UNIT/ML ~~LOC~~ SOLN
15.0000 [IU] | Freq: Every day | SUBCUTANEOUS | Status: DC
  Administered 2023-01-08: 15 [IU] via SUBCUTANEOUS
  Filled 2023-01-08 (×2): qty 0.15

## 2023-01-08 MED ORDER — FENTANYL BOLUS VIA INFUSION
50.0000 ug | INTRAVENOUS | Status: DC | PRN
  Administered 2023-01-09: 50 ug via INTRAVENOUS
  Administered 2023-01-09 – 2023-01-12 (×9): 100 ug via INTRAVENOUS
  Administered 2023-01-14 (×2): 50 ug via INTRAVENOUS
  Administered 2023-01-15: 100 ug via INTRAVENOUS

## 2023-01-08 MED ORDER — METHYLPREDNISOLONE SODIUM SUCC 125 MG IJ SOLR
125.0000 mg | Freq: Every day | INTRAMUSCULAR | Status: DC
  Administered 2023-01-08 – 2023-01-09 (×2): 125 mg via INTRAVENOUS
  Filled 2023-01-08 (×2): qty 2

## 2023-01-08 MED ORDER — GABAPENTIN 300 MG PO CAPS
600.0000 mg | ORAL_CAPSULE | Freq: Three times a day (TID) | ORAL | Status: DC
  Administered 2023-01-08 – 2023-01-16 (×23): 600 mg
  Filled 2023-01-08 (×22): qty 2

## 2023-01-08 MED ORDER — INSULIN ASPART 100 UNIT/ML IJ SOLN
0.0000 [IU] | INTRAMUSCULAR | Status: DC
  Administered 2023-01-08 (×4): 3 [IU] via SUBCUTANEOUS
  Administered 2023-01-09: 18 [IU] via SUBCUTANEOUS
  Administered 2023-01-09: 5 [IU] via SUBCUTANEOUS
  Administered 2023-01-09 (×2): 3 [IU] via SUBCUTANEOUS
  Filled 2023-01-08 (×8): qty 1

## 2023-01-08 MED ORDER — FAMOTIDINE IN NACL 20-0.9 MG/50ML-% IV SOLN
20.0000 mg | Freq: Every day | INTRAVENOUS | Status: DC
  Administered 2023-01-08: 20 mg via INTRAVENOUS
  Filled 2023-01-08: qty 50

## 2023-01-08 MED ORDER — POLYETHYLENE GLYCOL 3350 17 G PO PACK
17.0000 g | PACK | Freq: Every day | ORAL | Status: DC
  Administered 2023-01-08 – 2023-01-14 (×7): 17 g
  Filled 2023-01-08 (×7): qty 1

## 2023-01-08 MED ORDER — ASPIRIN 81 MG PO CHEW
81.0000 mg | CHEWABLE_TABLET | Freq: Every day | ORAL | Status: DC
  Administered 2023-01-08 – 2023-01-16 (×9): 81 mg
  Filled 2023-01-08 (×9): qty 1

## 2023-01-08 MED ORDER — ORAL CARE MOUTH RINSE: 15.0000 mL | OROMUCOSAL | Status: DC | PRN

## 2023-01-08 MED ORDER — ORAL CARE MOUTH RINSE
15.0000 mL | OROMUCOSAL | Status: DC
  Administered 2023-01-08 – 2023-01-16 (×95): 15 mL via OROMUCOSAL

## 2023-01-08 MED ORDER — SODIUM CHLORIDE 0.9 % IV SOLN: 250.0000 mL | INTRAVENOUS | Status: DC

## 2023-01-08 MED ORDER — MAGNESIUM OXIDE 400 MG PO TABS
400.0000 mg | ORAL_TABLET | Freq: Two times a day (BID) | ORAL | Status: DC
  Administered 2023-01-08 – 2023-01-10 (×4): 400 mg
  Filled 2023-01-08 (×7): qty 1

## 2023-01-08 MED ORDER — ENOXAPARIN SODIUM 80 MG/0.8ML IJ SOSY
70.0000 mg | PREFILLED_SYRINGE | INTRAMUSCULAR | Status: DC
  Administered 2023-01-08 – 2023-01-20 (×13): 70 mg via SUBCUTANEOUS
  Filled 2023-01-08 (×13): qty 0.8

## 2023-01-08 MED ORDER — DOCUSATE SODIUM 50 MG/5ML PO LIQD
100.0000 mg | Freq: Two times a day (BID) | ORAL | Status: DC
  Administered 2023-01-08 – 2023-01-15 (×13): 100 mg
  Filled 2023-01-08 (×14): qty 10

## 2023-01-08 MED ORDER — PROPOFOL 1000 MG/100ML IV EMUL
5.0000 ug/kg/min | INTRAVENOUS | Status: DC
  Administered 2023-01-08: 35 ug/kg/min via INTRAVENOUS
  Administered 2023-01-08: 10 ug/kg/min via INTRAVENOUS
  Administered 2023-01-08: 40 ug/kg/min via INTRAVENOUS
  Administered 2023-01-09 (×2): 25 ug/kg/min via INTRAVENOUS
  Filled 2023-01-08 (×6): qty 100

## 2023-01-08 MED ORDER — ROCURONIUM BROMIDE 10 MG/ML (PF) SYRINGE
PREFILLED_SYRINGE | INTRAVENOUS | Status: AC
  Administered 2023-01-08: 100 mg
  Filled 2023-01-08: qty 10

## 2023-01-08 MED ORDER — IPRATROPIUM-ALBUTEROL 0.5-2.5 (3) MG/3ML IN SOLN
3.0000 mL | RESPIRATORY_TRACT | Status: DC
  Administered 2023-01-08 – 2023-01-11 (×21): 3 mL via RESPIRATORY_TRACT
  Filled 2023-01-08 (×6): qty 3
  Filled 2023-01-08: qty 6
  Filled 2023-01-08 (×2): qty 3
  Filled 2023-01-08: qty 6
  Filled 2023-01-08 (×9): qty 3

## 2023-01-08 MED ORDER — ATORVASTATIN CALCIUM 20 MG PO TABS
40.0000 mg | ORAL_TABLET | Freq: Every day | ORAL | Status: DC
  Filled 2023-01-08: qty 2

## 2023-01-08 MED ORDER — MIDAZOLAM HCL 2 MG/2ML IJ SOLN
INTRAMUSCULAR | Status: AC
  Filled 2023-01-08: qty 2

## 2023-01-08 MED ORDER — CHLORHEXIDINE GLUCONATE CLOTH 2 % EX PADS
6.0000 | MEDICATED_PAD | Freq: Every day | CUTANEOUS | Status: DC
  Administered 2023-01-08 – 2023-01-21 (×12): 6 via TOPICAL

## 2023-01-08 MED ORDER — FENTANYL 2500MCG IN NS 250ML (10MCG/ML) PREMIX INFUSION
0.0000 ug/h | INTRAVENOUS | Status: DC
  Administered 2023-01-08: 250 ug/h via INTRAVENOUS
  Administered 2023-01-09: 200 ug/h via INTRAVENOUS
  Administered 2023-01-09: 175 ug/h via INTRAVENOUS
  Administered 2023-01-10: 200 ug/h via INTRAVENOUS
  Administered 2023-01-11 – 2023-01-14 (×7): 175 ug/h via INTRAVENOUS
  Administered 2023-01-15: 100 ug/h via INTRAVENOUS
  Administered 2023-01-15: 150 ug/h via INTRAVENOUS
  Filled 2023-01-08 (×13): qty 250

## 2023-01-08 MED ORDER — FENTANYL CITRATE (PF) 100 MCG/2ML IJ SOLN
INTRAMUSCULAR | Status: AC
  Administered 2023-01-08: 100 ug
  Filled 2023-01-08: qty 2

## 2023-01-08 NOTE — Plan of Care (Signed)
Patient is cooperating with care team and interventions to meet goals for discharge.  Terrilyn Saver, RN   Problem: Education: Goal: Ability to describe self-care measures that may prevent or decrease complications (Diabetes Survival Skills Education) will improve Outcome: Progressing Goal: Individualized Educational Video(s) Outcome: Progressing   Problem: Coping: Goal: Ability to adjust to condition or change in health will improve Outcome: Progressing   Problem: Fluid Volume: Goal: Ability to maintain a balanced intake and output will improve Outcome: Progressing   Problem: Health Behavior/Discharge Planning: Goal: Ability to identify and utilize available resources and services will improve Outcome: Progressing Goal: Ability to manage health-related needs will improve Outcome: Progressing   Problem: Metabolic: Goal: Ability to maintain appropriate glucose levels will improve Outcome: Progressing   Problem: Nutritional: Goal: Maintenance of adequate nutrition will improve Outcome: Progressing Goal: Progress toward achieving an optimal weight will improve Outcome: Progressing   Problem: Skin Integrity: Goal: Risk for impaired skin integrity will decrease Outcome: Progressing   Problem: Tissue Perfusion: Goal: Adequacy of tissue perfusion will improve Outcome: Progressing   Problem: Education: Goal: Understanding of cardiac disease, CV risk reduction, and recovery process will improve Outcome: Progressing Goal: Individualized Educational Video(s) Outcome: Progressing   Problem: Activity: Goal: Ability to tolerate increased activity will improve Outcome: Progressing   Problem: Cardiac: Goal: Ability to achieve and maintain adequate cardiovascular perfusion will improve Outcome: Progressing   Problem: Health Behavior/Discharge Planning: Goal: Ability to safely manage health-related needs after discharge will improve Outcome: Progressing   Problem:  Education: Goal: Knowledge of General Education information will improve Description: Including pain rating scale, medication(s)/side effects and non-pharmacologic comfort measures Outcome: Progressing   Problem: Health Behavior/Discharge Planning: Goal: Ability to manage health-related needs will improve Outcome: Progressing   Problem: Clinical Measurements: Goal: Ability to maintain clinical measurements within normal limits will improve Outcome: Progressing Goal: Will remain free from infection Outcome: Progressing Goal: Diagnostic test results will improve Outcome: Progressing Goal: Respiratory complications will improve Outcome: Progressing Goal: Cardiovascular complication will be avoided Outcome: Progressing   Problem: Activity: Goal: Risk for activity intolerance will decrease Outcome: Progressing   Problem: Nutrition: Goal: Adequate nutrition will be maintained Outcome: Progressing   Problem: Coping: Goal: Level of anxiety will decrease Outcome: Progressing   Problem: Elimination: Goal: Will not experience complications related to bowel motility Outcome: Progressing Goal: Will not experience complications related to urinary retention Outcome: Progressing   Problem: Pain Managment: Goal: General experience of comfort will improve Outcome: Progressing   Problem: Safety: Goal: Ability to remain free from injury will improve Outcome: Progressing   Problem: Skin Integrity: Goal: Risk for impaired skin integrity will decrease Outcome: Progressing

## 2023-01-08 NOTE — Progress Notes (Signed)
RT called to room for pt desat. O2 increased to 6L with sat maintained in mid 90's. Will Continue to follow.

## 2023-01-08 NOTE — Significant Event (Signed)
Rapid Response Event Note   Reason for Call :  Jerking/twitching  Initial Focused Assessment:  Rapid response RN arrived in patient's room with patient sitting up in bed surrounded by RT and 2A RNs. Attending MD arrived just as rapid response RN was. Patient was on bipap with eyes closed and jerking movements in arms and legs. Was able to open eyes, focus on rapid response RN, and say his name. Unable to focus for long and unable to say more than name. No disruption in jerking movements while patient speaking or focusing. Oxygen saturations 95% on bipap at 28% FiO2, heart rate 94-108 irregular, lungs diminished.   Per report from MD and 2A RN patient not at baseline mentation overnight and had some jerking but not as bad as time of rapid response. ABG taken overnight showing increased CO2, patient placed on bipap but at low settings. Just prior to rapid response being called patient had been placed on increased/appropriate settings to try and improve patient's condition.  Interventions:  Dr. Georgeann Alexander placed orders including medications like steroids and nebulizer treatments and a VBG. Patient did desaturate a few times to upper 80s with good pleth on 28% FiO2, gave patient the 100% O2 breath and patient would come up to upper 90s. If happened again, 2A RN was going to reach out to RT about increasing oxygen setting on bipap machine.  Plan of Care:  Patient currently to remain on 2A to give time for interventions create improvement, low threshold for transfer to ICU. 2A RN to reach back out to rapid response if needed and rapid response will check in again later.   Event Summary:   MD Notified: Dr. Georgeann Alexander Call Time: 10:49 Arrival Time: 10:50 End Time: 11:25  Cody Dallas, RN

## 2023-01-08 NOTE — Progress Notes (Signed)
SUBJECTIVE: Patient is a 67 y.o. 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  Patient states he had not been feeling well for a few days. On Sunday. he drove out of town with a friend, heavily exerted himself while walking around outside. Patient proceeded to drive himself home when he began to feel chest discomfort in the center of his chest, short of breath, diaphoretic, loss of bladder and bowel control. After patient arrived at home, EMS was called to bring him to the ED.    Patient is established with [facility] cardiology, is non-compliant with follow up appointments.   Overnight, patient developed acute encephalopathy with concern for hypercapnia, acute respiratory failure requiring BiPAP, hyperammonemia. Troponin levels repeated, 71>75. Repeat EKG, in sinus rhythm, no acute changes compared to EKG on arrival to ED.   Vitals:   01/07/23 2109 01/07/23 2312 01/08/23 0335 01/08/23 0531  BP: 139/85 98/62 104/60   Pulse: 82 90 80   Resp: 20 20 20    Temp: 97.9 F (36.6 C) 98 F (36.7 C) 97.9 F (36.6 C)   TempSrc:      SpO2: 98% 98% 96%   Weight:   (!) 137.7 kg (!) 138.6 kg  Height:        Intake/Output Summary (Last 24 hours) at 01/08/2023 0917 Last data filed at 01/08/2023 0340 Gross per 24 hour  Intake 450.08 ml  Output 300 ml  Net 150.08 ml    LABS: Basic Metabolic Panel: Recent Labs    01/07/23 2257 01/08/23 0431  NA 136 136  K 3.9 4.1  CL 89* 90*  CO2 38* 38*  GLUCOSE 182* 219*  BUN 23 26*  CREATININE 1.02 1.27*  CALCIUM 8.5* 8.7*   Liver Function Tests: No results for input(s): "AST", "ALT", "ALKPHOS", "BILITOT", "PROT", "ALBUMIN" in the last 72 hours. No results for input(s): "LIPASE",  "AMYLASE" in the last 72 hours. CBC: Recent Labs    01/05/23 1535 01/07/23 2306 01/08/23 0431  WBC 22.9* 6.9 7.3  NEUTROABS 20.6*  --   --   HGB 15.1 12.7* 13.1  HCT 47.5 42.0 44.4  MCV 89.8 95.5 95.7  PLT 233 195 193   Cardiac Enzymes: No results for input(s): "CKTOTAL", "CKMB", "CKMBINDEX", "TROPONINI" in the last 72 hours. BNP: Invalid input(s): "POCBNP" D-Dimer: Recent Labs    01/07/23 2306  DDIMER 0.51*   Hemoglobin A1C: No results for input(s): "HGBA1C" in the last 72 hours. Fasting Lipid Panel: No results for input(s): "CHOL", "HDL", "LDLCALC", "TRIG", "CHOLHDL", "LDLDIRECT" in the last 72 hours. Thyroid Function Tests: Recent Labs    01/05/23 2111  TSH 0.389   Anemia Panel: No results for input(s): "VITAMINB12", "FOLATE", "FERRITIN", "TIBC", "IRON", "RETICCTPCT" in the last 72 hours.   PHYSICAL EXAM General: obese gentleman, sleeping in bed, on BiPAP  Neck:  No JVD.  Lungs: Clear bilaterally to auscultation and percussion. Heart: HRRR . Normal S1 and S2 without gallops or murmurs.  Abdomen: Bowel sounds are positive, abdomen soft and non-tender  Extremities: No clubbing, cyanosis, positive for bilateral lower extremity edema    TELEMETRY: sinus rhythm, HR 76 bpm  ASSESSMENT AND PLAN: Patient developed change of mental status over night, acute encephalopathy with concern for  hypercapnia, acute respiratory failure requiring BiPAP. Patient sleeping in bed this morning, on BiPAP. Troponin levels flat, no acute changes on EKG. Echocardiogram 01/07/23 normal EF 65-70%, severe LVH, grade II DD, dilated aortic root 42 mm. Agree with IV Lasix. Continue spironolactone, metoprolol and ACE. Will continue to follow.    ICD-10-CM   1. Respiratory distress  R06.03     2. Congestive heart failure, unspecified HF chronicity, unspecified heart failure type (HCC)  I50.9     3. Lower urinary tract infection  N39.0       Principal Problem:   NSTEMI (non-ST elevated  myocardial infarction) (HCC) Active Problems:   Chronic obstructive pulmonary disease (COPD) (HCC)   Type 2 diabetes mellitus with hyperglycemia, with long-term current use of insulin (HCC)   Essential hypertension   Obesity, Class III, BMI 40-49.9 (morbid obesity) (HCC)   Acute on chronic respiratory failure with hypoxia (HCC)   Acute on chronic diastolic CHF (congestive heart failure) (HCC)   OSA (obstructive sleep apnea)   Benign prostatic hyperplasia with urinary hesitancy   Urinary tract infection   Hypokalemia   History of kidney stones    Thiago Ragsdale, FNP-C 01/08/2023 9:17 AM

## 2023-01-08 NOTE — Progress Notes (Signed)
   01/08/23 1100  Spiritual Encounters  Type of Visit Initial  Care provided to: Patient  Conversation partners present during encounter Nurse  Referral source Code page  Reason for visit Urgent spiritual support  OnCall Visit Yes   Chaplain responded to RRT page. Chaplain provided compassionate presence to patient and care team. No family was present.

## 2023-01-08 NOTE — Procedures (Signed)
Endotracheal Intubation: Patient required placement of an artificial airway secondary to unresponsive state   Consent: Emergent.    Hand washing performed prior to starting the procedure.    Medications administered for sedation prior to procedure:  Fentanyl 100 mcg IV, 20 mg Etomidate IV, Rocuronium 100 mg IV     A time out procedure was called and correct patient, name, & ID confirmed. Needed supplies and equipment were assembled and checked to include ETT, 10 ml syringe, Glidescope, Mac and Miller blades, suction, oxygen and bag mask valve, end tidal CO2 monitor.    Patient was positioned to align the mouth and pharynx to facilitate visualization of the glottis.    Heart rate, SpO2 and blood pressure was continuously monitored during the procedure. Pre-oxygenation was conducted prior to intubation and endotracheal tube was placed through the vocal cords into the trachea.       The artificial airway was placed under direct visualization via glidescope route using a  8 cm ETT on the first attempt.   ETT was secured at 24 cm .   Placement was confirmed by auscuitation of lungs with good breath sounds bilaterally and no stomach sounds.  Condensation was noted on endotracheal tube.   Pulse ox 98%  CO2 detector in place with appropriate color change.    Complications: None .        Chest radiograph ordered and pending.    Zada Girt, AGNP  Pulmonary/Critical Care Pager [phone] (please enter 7 digits) PCCM Consult Pager [phone] (please enter 7 digits)

## 2023-01-08 NOTE — Progress Notes (Signed)
PHARMACIST - PHYSICIAN COMMUNICATION  CONCERNING:  Enoxaparin (Lovenox) for DVT Prophylaxis    RECOMMENDATION: Patient was prescribed enoxaprin 40mg  q24 hours for VTE prophylaxis.   Filed Weights   01/05/23 1548 01/08/23 0335 01/08/23 0531  Weight: 136.1 kg (300 lb) (!) 137.7 kg (303 lb 9.2 oz) (!) 138.6 kg (305 lb 8.9 oz)    Body mass index is 40.31 kg/m.  Estimated Creatinine Clearance: 84.8 mL/min (A) (by C-G formula based on SCr of 1.27 mg/dL (H)).   Based on [facility] policy patient is candidate for enoxaparin 0.5mg /kg TBW SQ every 24 hours based on BMI being >30.    DESCRIPTION: Pharmacy has adjusted enoxaparin dose per [facility] policy.  Patient is now receiving enoxaparin 70 mg every 24 hours    Sharen Hones, PharmD Clinical Pharmacist  01/08/2023 1:19 PM

## 2023-01-08 NOTE — Progress Notes (Signed)
RT called to room for pt desat. Placed pt on V-60 16/8 28%. Sat maintained in low 90's. Pt triggering 70-80% of breaths.

## 2023-01-08 NOTE — Procedures (Signed)
[name]  MRN: [id number]  Epilepsy Attending: Charlsie Quest  Referring Physician/Provider: Ezequiel Essex, NP  Date: 01/08/2023 Duration: 28.03 mins  Patient history: 66yo m with ams getting eeg to evaluate for seizure  Level of alertness: comatose/sedated  AEDs during EEG study: propofol  Technical aspects: This EEG study was done with scalp electrodes positioned according to the 10-20 International system of electrode placement. Electrical activity was reviewed with band pass filter of 1-70Hz , sensitivity of 7 uV/mm, display speed of 63mm/sec with a 60Hz  notched filter applied as appropriate. EEG data were recorded continuously and digitally stored.  Video monitoring was available and reviewed as appropriate.  Description: EEG showed continuous generalized 3 to 6 Hz theta-delta slowing admixed with intermittent 12-13hz  frontocentral beta activity. Hyperventilation and photic stimulation were not performed.     ABNORMALITY - Continuous slow, generalized  IMPRESSION: This study is suggestive of severe diffuse encephalopathy, nonspecific etiology. No seizures or epileptiform discharges were seen throughout the recording.  Cody Alexander

## 2023-01-08 NOTE — Progress Notes (Signed)
Eeg done 

## 2023-01-08 NOTE — Consult Note (Signed)
NAME:  Cody Alexander, MRN:  [id number], DOB:  04/03/1956, LOS: 3 ADMISSION DATE:  01/05/2023, CONSULTATION DATE: 01/08/2023 REFERRING MD: Dr. Georgeann Oppenheim, CHIEF COMPLAINT: AMS    History of Present Illness:  This is a 67 yo male with a PMH of COPD and Chronic Systolic Diastolic CHF.  He presented to [facility] ER on 05/12 from home via EMS with c/o shortness of breath onset 05/12.  Per ER notes EMS reported pt wears 4L O2 chronically, however he has been noncompliant with his oxygen.  When EMS arrival at pts home he was noted to be diaphoretic with initial O2 sats @86 % on 4L O2.    ED Course  Upon arrival to the ER pt alert/oriented but remained diaphoretic/tachypneic requiring 15L NRB with resolution of hypoxia.  Pt placed on Bipap.  Pt reported he had some lower extremity swelling and inability to void.  CXR concerning for mild interstitial edema.  CT Renal stone study revealed no nephrolithiasis, ureterolithiasis, or obstructive uropathy but a small amount of gas present in the bladder presumably related to procedure and small bladder diverticula.  CTA Chest negative for PE or acute pulmonary parenchymal findings.  UA concerning for possible UTI.  Pt received cefepime.  He was subsequently admitted to the progressive care unit for additional workup and treatment.  See detailed hospital course below under significant events   Pertinent  Medical History  COPD Chronic O2 @4L   Type II Diabetes Mellitus  Essential HTN  Neuropathy  Chronic Systolic Diastolic CHF  OSA  MI  Obesity  Substance Abuse   Significant Hospital Events: Including procedures, antibiotic start and stop dates in addition to other pertinent events   05/13: Pt admitted to the progressive care unit with acute on chronic hypoxic respiratory failure secondary to acute on chronic CHF exacerbation, UTI, and NSTEMI  05/15: Pt developed acute encephalopathy and acute hypercapnic hypoxic respiratory failure overnight requiring portable  Bipap.  However, pt failed Bipap developed jerking movements concerning for myoclonus requiring transfer to ICU. PCCM team consulted and pt mechanically intubated   Interim History / Subjective:  Pt sedated following mechanical intubation post intubation jerking movements subsided   Objective   Blood pressure 104/60, pulse 90, temperature 97.8 F (36.6 C), resp. rate (!) 27, height 6\' 1"  (1.854 m), weight (!) 138.6 kg, SpO2 99 %.    Vent Mode: PRVC FiO2 (%):  [50 %] 50 % Set Rate:  [18 bmp] 18 bmp Vt Set:  [620 mL] 620 mL PEEP:  [8 cmH20] 8 cmH20 Pressure Support:  [28 cmH20] 28 cmH20 Plateau Pressure:  [19 cmH20] 19 cmH20   Intake/Output Summary (Last 24 hours) at 01/08/2023 1231 Last data filed at 01/08/2023 0340 Gross per 24 hour  Intake 360 ml  Output 300 ml  Net 60 ml   Filed Weights   01/05/23 1548 01/08/23 0335 01/08/23 0531  Weight: 136.1 kg (!) 137.7 kg (!) 138.6 kg    Examination: General: Acutely-ill appearing male, NAD mechanically intubated  HENT: Supple, no JVD  Lungs: Diminished throughout, even, non labored  Cardiovascular: NSR, s1s2, no m/r/g, 2+ radial/2+ distal pulses, 2+ bilateral lower extremity edema  Abdomen: +BS x4, obese, soft, non distended  Extremities: Bilateral upper and lower extremity intermittent jerking  Neuro: Sedated, not following commands, withdraws from painful stimulation, PERRL  GU: External cathter in place   Resolved Hospital Problem list     Assessment & Plan:  #Acute on chronic hypoxic hypercapnic respiratory failure secondary to AECOPD and acute CHF  exacerbation  #OSA #Mechanical intubation  - Full vent support for now: vent settings reviewed and established  - Continue lung protective strategies  - Plateau pressures less than 30 cm H20 - SBT once all parameters met  - VAP bundle implemented  - Intermittent CXR and ABG's - Scheduled and prn bronchodilator therapy  - IV steroids wean as tolerated   #Elevated troponin  secondary to NSTEMI vs. demand ischemia  #Acute on chronic diastolic systolic CHF  #Hypotension likely secondary to sedating medication  Hx: HTN  Echo 01/07/23: EF 65 to 70%; grade II diastolic dysfunction; trivial mitral valve regurgitation  - Continuous telemetry monitoring  - Trend troponin's until peaked  - Prn levophed gtt to maintain map >65  - Hold outpatient antihypertensives and beta-blockers for now  - Continue aspirin and atorvastatin  - Cardiology consulted appreciate input   #Mild acute kidney injury suspect secondary to diuretic  - Trend BMP and lactic acid  - Replace electrolytes as indicated  - Monitor UOP - Avoid nephrotoxic medication when able - Hold furosemide for now due to metabolic alkalosis; if pt requires diuresis will administer diamox   #UTI secondary to Ecoli  - Trend WBC and monitor fever curve  - Trend PCT  - Continue ceftriaxone  #Type II diabetes mellitus  - CBG's q4hrs  - SSI  - Hemoglobin A1c pending   #Acute metabolic encephalopathy  #Possible seizure activity vs. possible myoclonus  #Mechanical intubation pain/discomfort  - CT Head and EEG pending  - Correct metabolic derangements  - Maintain RASS goal of 0 to -1 - PAD protocol to maintain RASS goal: Propofol and fentanyl gtts  - WUA daily  - Will obtain urine drug screen  - Avoid sedating medications when able   Best Practice (right click and "Reselect all SmartList Selections" daily)   Diet/type: NPO DVT prophylaxis: LMWH GI prophylaxis: H2B Lines: N/A Foley:  N/A Code Status:  full code Last date of multidisciplinary goals of care discussion [N/A]  05/15: Attempted to contact pts friend Lalla Brothers utilizing phone number listed in epic, however the phone number listed in epic is the wrong number.  No other family contact information available in epic.  Labs   CBC: Recent Labs  Lab 01/05/23 1535 01/07/23 2306 01/08/23 0431  WBC 22.9* 6.9 7.3  NEUTROABS 20.6*  --   --    HGB 15.1 12.7* 13.1  HCT 47.5 42.0 44.4  MCV 89.8 95.5 95.7  PLT 233 195 193    Basic Metabolic Panel: Recent Labs  Lab 01/05/23 1535 01/06/23 0409 01/07/23 0221 01/07/23 2257 01/08/23 0431  NA 139 139 137 136 136  K 3.3* 3.6 3.7 3.9 4.1  CL 95* 93* 92* 89* 90*  CO2 31 33* 37* 38* 38*  GLUCOSE 261* 166* 176* 182* 219*  BUN 19 20 21 23  26*  CREATININE 1.14 1.05 1.01 1.02 1.27*  CALCIUM 8.9 8.3* 8.2* 8.5* 8.7*   GFR: Estimated Creatinine Clearance: 84.8 mL/min (A) (by C-G formula based on SCr of 1.27 mg/dL (H)). Recent Labs  Lab 01/05/23 1535 01/07/23 2306 01/08/23 0431  WBC 22.9* 6.9 7.3    Liver Function Tests: No results for input(s): "AST", "ALT", "ALKPHOS", "BILITOT", "PROT", "ALBUMIN" in the last 168 hours. No results for input(s): "LIPASE", "AMYLASE" in the last 168 hours. Recent Labs  Lab 01/07/23 2256  AMMONIA 45*    ABG    Component Value Date/Time   PHART 7.34 (L) 01/08/2023 0201   PCO2ART 86 (HH) 01/08/2023  0201   PO2ART 45 (L) 01/08/2023 0201   HCO3 46.4 (H) 01/08/2023 1105   O2SAT 90 01/08/2023 1105     Coagulation Profile: Recent Labs  Lab 01/05/23 2111 01/07/23 2306  INR 1.1 1.0    Cardiac Enzymes: No results for input(s): "CKTOTAL", "CKMB", "CKMBINDEX", "TROPONINI" in the last 168 hours.  HbA1C: Hemoglobin A1C  Date/Time Value Ref Range Status  08/12/2013 04:01 AM 6.9 (H) 4.2 - 6.3 % Final    Comment:    The American Diabetes Association recommends that a primary goal of therapy should be <7% and that physicians should reevaluate the treatment regimen in patients with HbA1c values consistently >8%.    Hgb A1c MFr Bld  Date/Time Value Ref Range Status  11/10/2022 12:36 AM 11.2 (H) 4.8 - 5.6 % Final    Comment:    (NOTE)         Prediabetes: 5.7 - 6.4         Diabetes: >6.4         Glycemic control for adults with diabetes: <7.0   01/27/2022 09:55 AM 10.2 (H) 4.8 - 5.6 % Final    Comment:    (NOTE) Pre diabetes:           5.7%-6.4%  Diabetes:              >6.4%  Glycemic control for   <7.0% adults with diabetes     CBG: Recent Labs  Lab 01/07/23 1654 01/07/23 2110 01/08/23 0825 01/08/23 1140 01/08/23 1207  GLUCAP 123* 171* 216* 180* 167*    Review of Systems:   Unable to assess pt mechanically intubated   Past Medical History:  He,  has a past medical history of CHF (congestive heart failure) (HCC), COPD (chronic obstructive pulmonary disease) (HCC), Diabetes mellitus without complication (HCC), Hypertension, and Neuropathy.   Surgical History:   Past Surgical History:  Procedure Laterality Date   LEFT HEART CATH AND CORONARY ANGIOGRAPHY Right 02/27/2018   Procedure: LEFT HEART CATH AND CORONARY ANGIOGRAPHY;  Surgeon: Laurier Nancy, MD;  Location: [facility] INVASIVE CV LAB;  Service: Cardiovascular;  Laterality: Right;     Social History:   reports that he has quit smoking. His smoking use included cigarettes. He has never used smokeless tobacco. He reports that he does not drink alcohol and does not use drugs.   Family History:  His family history includes Hypertension in his father; Stroke in his mother.   Allergies Allergies  Allergen Reactions   Erythromycin Anaphylaxis and Swelling    Had eye swelling with erythromycin during a time when he had perf ear drum.  Tolerated azithromycin.     Home Medications  Prior to Admission medications   Medication Sig Start Date End Date Taking? Authorizing Provider  albuterol (PROVENTIL) (2.5 MG/3ML) 0.083% nebulizer solution Take 2.5 mg by nebulization every 4 (four) hours as needed. 08/01/22 08/01/23 Yes [provider]  ASPIRIN EC 81 MG EC tablet Take 81 mg by mouth daily. 11/15/22  Yes [provider]  atorvastatin (LIPITOR) 40 MG tablet Take 40 mg by mouth daily.   Yes [provider]  budesonide-formoterol (SYMBICORT) 160-4.5 MCG/ACT inhaler Inhale 2 puffs into the lungs 2 (two) times daily.   Yes [provider]  bumetanide (BUMEX) 1 MG tablet Take 3 mg by mouth daily. 11/21/22  Yes [provider]  diclofenac Sodium (VOLTAREN) 1 % GEL Apply 2 g topically in the morning and at bedtime. 08/01/22  Yes [provider]  docusate sodium (COLACE) 100 MG capsule Take 1 capsule (100 mg total) by mouth 2 (two) times daily. 11/15/22  Yes Baron Hamper, MD  hydrOXYzine (ATARAX) 10 MG tablet Take 1 tablet (10 mg total) by mouth 3 (three) times daily as needed. Home med. 06/25/22  Yes [name], MD  insulin lispro (HUMALOG) 100 UNIT/ML KwikPen Inject 10 Units into the skin 3 (three) times daily with meals.   Yes [provider]  ipratropium-albuterol (DUONEB) 0.5-2.5 (3) MG/3ML SOLN Take 3 mLs by nebulization every 6 (six) hours as needed. 06/25/22  Yes [name], MD  isosorbide dinitrate (ISORDIL) 30 MG tablet Take 30 mg by mouth every morning. 11/14/20  Yes [provider]  LANTUS SOLOSTAR 100 UNIT/ML Solostar Pen Inject 40 Units into the skin 2 (two) times daily. 01/01/23  Yes [provider]  lisinopril (ZESTRIL) 20 MG tablet Take 20 mg by mouth daily.   Yes [provider]  magnesium oxide (MAG-OX) 400 MG tablet Take 1 tablet by mouth 2 (two) times daily. 10/24/22  Yes [provider]  metFORMIN (GLUCOPHAGE) 1000 MG tablet Take 1,000 mg by mouth daily. 06/02/20  Yes [provider]  metoprolol tartrate (LOPRESSOR) 25 MG tablet Take 25 mg by mouth daily.   Yes [provider]  PROAIR HFA 108 (90 Base) MCG/ACT inhaler Inhale 2 puffs into the lungs every 4 (four) hours as needed for wheezing. 08/18/21  Yes Wieting, Richard, MD  triamcinolone cream (KENALOG) 0.1 % Apply 1 Application topically 2 (two) times daily. 10/25/22  Yes [provider]  furosemide (LASIX) 40 MG tablet Take 1 tablet (40 mg total) by mouth 2 (two) times daily. 11/15/22 12/15/22  Baron Hamper, MD  gabapentin (NEURONTIN) 600 MG tablet Take 600 mg by mouth 3  (three) times daily. Patient not taking: Reported on 01/05/2023 04/18/22   [provider]  insulin detemir (LEVEMIR) 100 UNIT/ML FlexPen Inject 30 Units into the skin in the morning and at bedtime. Patient not taking: Reported on 01/05/2023 06/25/22   [name], MD  KLOR-CON M10 10 MEQ tablet Take 10 mEq by mouth daily. Patient not taking: Reported on 01/05/2023 10/24/22   [provider]  Selenium Sulfide 2.25 % SHAM SMARTSIG:Liberally Topical Daily Patient not taking: Reported on 01/05/2023 10/28/22   [provider]     Critical care time: 65 minutes      Zada Girt, AGNP  Pulmonary/Critical Care Pager [phone] (please enter 7 digits) PCCM Consult Pager [phone] (please enter 7 digits)

## 2023-01-08 NOTE — Care Management Important Message (Signed)
Important Message  Patient Details  Name: Cody Alexander MRN: [id number] Date of Birth: 1956-05-08   Medicare Important Message Given:  Yes  Patient asleep upon visit.  Copy of Medicare IM left in room for reference.   [name] 01/08/2023, 11:08 AM

## 2023-01-08 NOTE — Progress Notes (Signed)
Brief significant event note.  Nonbillable note.  Patient was seen by myself this morning.  On respiratory distress overnight.  Patient was placed on portable BiPAP overnight with settings at.  To be an adequate.  Mental status poor this morning.  Discussed with RT.  Placed on BiPAP with settings 15/8.  Despite aggressive settings patient's mental status remained poor.  Began to have myoclonic jerks.  Decreasing oxygen saturation.  RRT called.  Patient transferred emergently to stepdown unit.  ICU consulted.  Notified by [facility] team that patient was emergently intubated for airway protection.  Appreciate swift response of RRT team and PCCM.    TRH service to sign off at this time.  Please reconsult our service when patient stable for transfer to general medical service.  Lolita Patella MD  No charge

## 2023-01-08 NOTE — Progress Notes (Signed)
Patient is tolerating the bipap- calmly sleeping-vitals stable-labs requested earlier this shift are resulting and Provider aware

## 2023-01-09 DIAGNOSIS — R0603 Acute respiratory distress: Secondary | ICD-10-CM | POA: Diagnosis not present

## 2023-01-09 DIAGNOSIS — I214 Non-ST elevation (NSTEMI) myocardial infarction: Secondary | ICD-10-CM | POA: Diagnosis not present

## 2023-01-09 DIAGNOSIS — R079 Chest pain, unspecified: Secondary | ICD-10-CM | POA: Diagnosis not present

## 2023-01-09 LAB — BASIC METABOLIC PANEL
Anion gap: 12 (ref 5–15)
BUN: 27 mg/dL — ABNORMAL HIGH (ref 8–23)
CO2: 34 mmol/L — ABNORMAL HIGH (ref 22–32)
Calcium: 8.7 mg/dL — ABNORMAL LOW (ref 8.9–10.3)
Chloride: 91 mmol/L — ABNORMAL LOW (ref 98–111)
Creatinine, Ser: 1.19 mg/dL (ref 0.61–1.24)
GFR, Estimated: >60 mL/min (ref >60)
Glucose, Bld: 173 mg/dL — ABNORMAL HIGH (ref 70–99)
Potassium: 3.2 mmol/L — ABNORMAL LOW (ref 3.5–5.1)
Sodium: 137 mmol/L (ref 135–145)

## 2023-01-09 LAB — BLOOD GAS, ARTERIAL
Acid-Base Excess: 17.4 mmol/L — ABNORMAL HIGH (ref 0.0–2.0)
Acid-Base Excess: 10.5 mmol/L — ABNORMAL HIGH (ref 0.0–2.0)
Bicarbonate: 44.4 mmol/L — ABNORMAL HIGH (ref 20.0–28.0)
Bicarbonate: 36.7 mmol/L — ABNORMAL HIGH (ref 20.0–28.0)
FIO2: 45 %
FIO2: 45 %
MECHVT: 620 mL
MECHVT: 550 mL
MECHVT: 550 mL
Mechanical Rate: 18
Mechanical Rate: 22
O2 Saturation: 96.2 %
O2 Saturation: 95.3 %
O2 Saturation: 92.4 %
PEEP: 8 cmH2O
PEEP: 8 cmH2O
Patient temperature: 37
Patient temperature: 37
pCO2 arterial: 61 mmHg — ABNORMAL HIGH (ref 32–48)
pCO2 arterial: 54 mmHg — ABNORMAL HIGH (ref 32–48)
pH, Arterial: 7.47 — ABNORMAL HIGH (ref 7.35–7.45)
pH, Arterial: 7.44 (ref 7.35–7.45)
pO2, Arterial: 67 mmHg — ABNORMAL LOW (ref 83–108)
pO2, Arterial: 62 mmHg — ABNORMAL LOW (ref 83–108)

## 2023-01-09 LAB — HEPATIC FUNCTION PANEL
ALT: 23 U/L (ref 0–44)
AST: 36 U/L (ref 15–41)
Albumin: 3.6 g/dL (ref 3.5–5.0)
Alkaline Phosphatase: 52 U/L (ref 38–126)
Bilirubin, Direct: 0.1 mg/dL (ref 0.0–0.2)
Indirect Bilirubin: 0.9 mg/dL (ref 0.3–0.9)
Total Bilirubin: 1 mg/dL (ref 0.3–1.2)
Total Protein: 7.1 g/dL (ref 6.5–8.1)

## 2023-01-09 LAB — CBC
HCT: 40.5 % (ref 39.0–52.0)
Hemoglobin: 12.8 g/dL — ABNORMAL LOW (ref 13.0–17.0)
MCH: 28.6 pg (ref 26.0–34.0)
MCHC: 31.6 g/dL (ref 30.0–36.0)
MCV: 90.6 fL (ref 80.0–100.0)
Platelets: 210 10*3/uL (ref 150–400)
RBC: 4.47 MIL/uL (ref 4.22–5.81)
RDW: 13.2 % (ref 11.5–15.5)
WBC: 8.6 10*3/uL (ref 4.0–10.5)
nRBC: 0 % (ref 0.0–0.2)

## 2023-01-09 LAB — PHOSPHORUS
Phosphorus: 1.5 mg/dL — ABNORMAL LOW (ref 2.5–4.6)
Phosphorus: 5.1 mg/dL — ABNORMAL HIGH (ref 2.5–4.6)
Phosphorus: 3.9 mg/dL (ref 2.5–4.6)

## 2023-01-09 LAB — MAGNESIUM
Magnesium: 2.2 mg/dL (ref 1.7–2.4)
Magnesium: 2.5 mg/dL — ABNORMAL HIGH (ref 1.7–2.4)

## 2023-01-09 LAB — GLUCOSE, CAPILLARY
Glucose-Capillary: 177 mg/dL — ABNORMAL HIGH (ref 70–99)
Glucose-Capillary: 184 mg/dL — ABNORMAL HIGH (ref 70–99)
Glucose-Capillary: 230 mg/dL — ABNORMAL HIGH (ref 70–99)
Glucose-Capillary: 343 mg/dL — ABNORMAL HIGH (ref 70–99)
Glucose-Capillary: 354 mg/dL — ABNORMAL HIGH (ref 70–99)
Glucose-Capillary: 357 mg/dL — ABNORMAL HIGH (ref 70–99)

## 2023-01-09 LAB — TROPONIN I (HIGH SENSITIVITY)
Troponin I (High Sensitivity): 84 ng/L — ABNORMAL HIGH (ref <18)
Troponin I (High Sensitivity): 78 ng/L — ABNORMAL HIGH (ref <18)
Troponin I (High Sensitivity): 65 ng/L — ABNORMAL HIGH (ref <18)

## 2023-01-09 LAB — POTASSIUM
Potassium: 3.3 mmol/L — ABNORMAL LOW (ref 3.5–5.1)
Potassium: 3.3 mmol/L — ABNORMAL LOW (ref 3.5–5.1)

## 2023-01-09 LAB — TRIGLYCERIDES: Triglycerides: 228 mg/dL — ABNORMAL HIGH (ref <150)

## 2023-01-09 MED ORDER — FREE WATER
30.0000 mL | Status: DC
  Administered 2023-01-09 – 2023-01-15 (×37): 30 mL

## 2023-01-09 MED ORDER — VITAL 1.5 CAL PO LIQD
1000.0000 mL | ORAL | Status: DC
  Administered 2023-01-09 – 2023-01-15 (×5): 1000 mL

## 2023-01-09 MED ORDER — POTASSIUM CHLORIDE 20 MEQ PO PACK
20.0000 meq | PACK | Freq: Once | ORAL | Status: AC
  Administered 2023-01-09: 20 meq
  Filled 2023-01-09: qty 1

## 2023-01-09 MED ORDER — ACETAZOLAMIDE SODIUM 500 MG IJ SOLR
500.0000 mg | Freq: Once | INTRAMUSCULAR | Status: AC
  Administered 2023-01-09: 500 mg via INTRAVENOUS
  Filled 2023-01-09: qty 500

## 2023-01-09 MED ORDER — INSULIN ASPART 100 UNIT/ML IJ SOLN
0.0000 [IU] | INTRAMUSCULAR | Status: DC
  Administered 2023-01-09: 20 [IU] via SUBCUTANEOUS
  Filled 2023-01-09: qty 1

## 2023-01-09 MED ORDER — PROSOURCE TF20 ENFIT COMPATIBL EN LIQD
60.0000 mL | Freq: Every day | ENTERAL | Status: DC
  Administered 2023-01-09 – 2023-01-16 (×31): 60 mL
  Filled 2023-01-09 (×18): qty 60

## 2023-01-09 MED ORDER — POTASSIUM CHLORIDE 20 MEQ PO PACK: 20.0000 meq | PACK | Freq: Once | ORAL | Status: DC

## 2023-01-09 MED ORDER — ADULT MULTIVITAMIN LIQUID CH
15.0000 mL | Freq: Every day | ORAL | Status: DC
  Administered 2023-01-10 – 2023-01-15 (×6): 15 mL
  Filled 2023-01-09 (×6): qty 15

## 2023-01-09 MED ORDER — INSULIN DETEMIR 100 UNIT/ML ~~LOC~~ SOLN
25.0000 [IU] | Freq: Two times a day (BID) | SUBCUTANEOUS | Status: DC
  Administered 2023-01-09: 25 [IU] via SUBCUTANEOUS
  Filled 2023-01-09: qty 0.25

## 2023-01-09 MED ORDER — POTASSIUM PHOSPHATES 15 MMOLE/5ML IV SOLN
45.0000 mmol | Freq: Once | INTRAVENOUS | Status: AC
  Administered 2023-01-09: 45 mmol via INTRAVENOUS
  Filled 2023-01-09: qty 15

## 2023-01-09 MED ORDER — SPIRONOLACTONE 12.5 MG HALF TABLET
12.5000 mg | ORAL_TABLET | Freq: Every day | ORAL | Status: DC
  Administered 2023-01-09 – 2023-01-11 (×3): 12.5 mg
  Filled 2023-01-09 (×3): qty 1

## 2023-01-09 MED ORDER — INSULIN REGULAR(HUMAN) IN NACL 100-0.9 UT/100ML-% IV SOLN
INTRAVENOUS | Status: DC
  Administered 2023-01-10: 5 [IU]/h via INTRAVENOUS
  Administered 2023-01-10: 8.5 [IU]/h via INTRAVENOUS
  Filled 2023-01-09 (×2): qty 100

## 2023-01-09 MED ORDER — POTASSIUM CHLORIDE 20 MEQ PO PACK
40.0000 meq | PACK | Freq: Once | ORAL | Status: AC
  Administered 2023-01-10: 40 meq
  Filled 2023-01-09: qty 2

## 2023-01-09 MED ORDER — FAMOTIDINE 20 MG PO TABS
20.0000 mg | ORAL_TABLET | Freq: Two times a day (BID) | ORAL | Status: DC
  Administered 2023-01-09 – 2023-01-16 (×15): 20 mg
  Filled 2023-01-09 (×15): qty 1

## 2023-01-09 MED ORDER — DEXMEDETOMIDINE HCL IN NACL 400 MCG/100ML IV SOLN
0.0000 ug/kg/h | INTRAVENOUS | Status: DC
  Administered 2023-01-09: 0.4 ug/kg/h via INTRAVENOUS
  Administered 2023-01-09: 0.6 ug/kg/h via INTRAVENOUS
  Administered 2023-01-09 – 2023-01-10 (×2): 0.4 ug/kg/h via INTRAVENOUS
  Administered 2023-01-10: 0.6 ug/kg/h via INTRAVENOUS
  Filled 2023-01-09 (×7): qty 100

## 2023-01-09 MED ORDER — DEXTROSE 50 % IV SOLN: 0.0000 mL | INTRAVENOUS | Status: DC | PRN

## 2023-01-09 MED ORDER — INSULIN ASPART 100 UNIT/ML IJ SOLN
5.0000 [IU] | Freq: Once | INTRAMUSCULAR | Status: AC
  Administered 2023-01-09: 5 [IU] via SUBCUTANEOUS
  Filled 2023-01-09: qty 1

## 2023-01-09 NOTE — Progress Notes (Signed)
Asked to evaluate the EKG as computer was reading ST elevation in inferolateral leads and the intubated patient.  There is no significant ST elevation inferolaterally and interpretation of the EKG has been changed.  Just to be on the cautious side and can repeat a EKG in 2 hours and may be trend troponins to see any difference compared to on admission.  Echocardiogram showed normal wall motion and normal ejection fraction thus likelihood of coronary artery disease is low.  Patient was supposed to go and see her his own cardiologist when we saw the patient on prior admission for possible workup for coronary artery disease but apparently nothing has been done.  May try to do the workup after extubation.  At this time no need to change any management and respiratory failure is most likely due to COPD exacerbation.  If troponins are elevated significantly may consider IV heparin at that time but not right now.

## 2023-01-09 NOTE — Progress Notes (Signed)
Progress Note   Per Cardiologist Dr. Welton Flakes no indication for cardiac catheterization upon his review of EKG.  Recommended trending troponin's q6hrs and repeat EKG in 2 hrs.  Will continue to monitor and assess pt.   Zada Girt, AGNP  Pulmonary/Critical Care Pager [phone] (please enter 7 digits) PCCM Consult Pager [phone] (please enter 7 digits)

## 2023-01-09 NOTE — Progress Notes (Signed)
ST elevation noted. EKG obtained. NP notified. Will continue to monitor.

## 2023-01-09 NOTE — Progress Notes (Signed)
Initial Nutrition Assessment  DOCUMENTATION CODES:   Morbid obesity  INTERVENTION:   Vital 1.5@40ml /hr- Initiate at 65ml/hr and increase by 46ml/hr q 8 hours until goal rate is reached.   ProSource TF 20- Give 60ml 5 times daily via tube, each supplement provides 80kcal and 20g of protein.   Free water flushes 30ml q4 hours to maintain tube patency   Regimen provides 1840kcal/day, 165g/day protein and 950ml/day of free water.   Liquid MVI daily via tube   Pt at high refeed risk; recommend monitor potassium, magnesium and phosphorus labs daily until stable  Daily weights   NUTRITION DIAGNOSIS:   Inadequate oral intake related to inability to eat (pt sedated and ventilated) as evidenced by NPO status.  GOAL:   Provide needs based on ASPEN/SCCM guidelines  MONITOR:   Vent status, Labs, Weight trends, TF tolerance, I & O's, Skin  REASON FOR ASSESSMENT:   Ventilator    ASSESSMENT:   67 y/o male with h/o COPD, CHF, HTN, CAD, HTN, MI, substance abuse and DM who is admitted with possible NSTEMI, CHF exacerbation, UTI, AKI, rhabdomyolysis and AMS.  Pt sedated and ventilated. OGT in place. Will plan to initiate tube feeds today. Pt is at refeed risk; phosphorus low and is being repleted. Per chart, pt appears weight stable pta.   Medications reviewed and include: aspirin, colace, lovenox, pepcid, insulin, Mg oxide, solu-medrol, miralax, aldactone, ceftriaxone, levophed, Kphos, propofol    Labs reviewed: K 3.2(L), BUN 27(H), P 1.5(L), Mg 2.2 wnl Cbgs- 230, 184 x 24 hrs  AIC 9.4(H)- 5/15  Patient is currently intubated on ventilator support MV: 11.0 L/min Temp (24hrs), Avg:98.4 F (36.9 C), Min:97.8 F (36.6 C), Max:99.2 F (37.3 C)  Propofol: 16.63 ml/hr- provides 439kcal/day   MAP- >35mmHg   UOP-   NUTRITION - FOCUSED PHYSICAL EXAM:  Flowsheet Row Most Recent Value  Orbital Region No depletion  Upper Arm Region No depletion  Thoracic and Lumbar Region  No depletion  Buccal Region No depletion  Temple Region No depletion  Clavicle Bone Region No depletion  Clavicle and Acromion Bone Region No depletion  Scapular Bone Region No depletion  Dorsal Hand No depletion  Patellar Region No depletion  Anterior Thigh Region No depletion  Posterior Calf Region No depletion  Edema (RD Assessment) Mild  Hair Reviewed  Eyes Reviewed  Mouth Reviewed  Skin Reviewed  Nails Reviewed   Diet Order:   Diet Order     None      EDUCATION NEEDS:   No education needs have been identified at this time  Skin:  Skin Assessment: Reviewed RN Assessment (ecchymosis)  Last BM:  5/13  Height:   Ht Readings from Last 1 Encounters:  01/05/23 6\' 1"  (1.854 m)    Weight:   Wt Readings from Last 1 Encounters:  01/09/23 (!) 140 kg    Ideal Body Weight:  83.6 kg  BMI:  Body mass index is 40.72 kg/m.  Estimated Nutritional Needs:   Kcal:  1540-1960kcal/day  Protein:  >165g/day  Fluid:  2.0L/day  Betsey Holiday MS, RD, LDN Please refer to [facility] for RD and/or RD on-call/weekend/after hours pager

## 2023-01-09 NOTE — Progress Notes (Signed)
NAME:  Cody Alexander, MRN:  [id number], DOB:  08/09/1956, LOS: 4 ADMISSION DATE:  01/05/2023, CONSULTATION DATE: 01/08/2023 REFERRING MD: Dr. Georgeann Oppenheim, CHIEF COMPLAINT: AMS    History of Present Illness:  This is a 67 yo male with a PMH of COPD and Chronic Systolic Diastolic CHF.  He presented to [facility] ER on 05/12 from home via EMS with c/o shortness of breath onset 05/12.  Per ER notes EMS reported pt wears 4L O2 chronically, however he has been noncompliant with his oxygen.  When EMS arrival at pts home he was noted to be diaphoretic with initial O2 sats @86 % on 4L O2.    ED Course  Upon arrival to the ER pt alert/oriented but remained diaphoretic/tachypneic requiring 15L NRB with resolution of hypoxia.  Pt placed on Bipap.  Pt reported he had some lower extremity swelling and inability to void.  CXR concerning for mild interstitial edema.  CT Renal stone study revealed no nephrolithiasis, ureterolithiasis, or obstructive uropathy but a small amount of gas present in the bladder presumably related to procedure and small bladder diverticula.  CTA Chest negative for PE or acute pulmonary parenchymal findings.  UA concerning for possible UTI.  Pt received cefepime.  He was subsequently admitted to the progressive care unit for additional workup and treatment.  See detailed hospital course below under significant events   Pertinent  Medical History  COPD Chronic O2 @4L   Type II Diabetes Mellitus  Essential HTN  Neuropathy  Chronic Systolic Diastolic CHF  OSA  MI  Obesity  Substance Abuse   Significant Hospital Events: Including procedures, antibiotic start and stop dates in addition to other pertinent events   05/13: Pt admitted to the progressive care unit with acute on chronic hypoxic respiratory failure secondary to acute on chronic CHF exacerbation, UTI, and NSTEMI  05/15: Pt developed acute encephalopathy and acute hypercapnic hypoxic respiratory failure overnight requiring portable  Bipap.  However, pt failed Bipap developed jerking movements concerning for myoclonus requiring transfer to ICU. PCCM team consulted and pt mechanically intubated  01/09/23- patient had abnormal EKG today but repeat cardiac biomarkers seemed to be normal. He is on MV today he is not ready for SBT.  We did deliver diamox today to help with acid base disorder.   Interim History / Subjective:  Pt sedated following mechanical intubation post intubation jerking movements subsided   Objective   Blood pressure 127/86, pulse 73, temperature 99.2 F (37.3 C), temperature source Axillary, resp. rate (!) 22, height 6\' 1"  (1.854 m), weight (!) 140 kg, SpO2 90 %.    Vent Mode: PRVC FiO2 (%):  [45 %-50 %] 45 % Set Rate:  [18 bmp-22 bmp] 22 bmp Vt Set:  [550 mL-620 mL] 550 mL PEEP:  [8 cmH20] 8 cmH20 Plateau Pressure:  [18 cmH20-20 cmH20] 20 cmH20   Intake/Output Summary (Last 24 hours) at 01/09/2023 1641 Last data filed at 01/09/2023 1503 Gross per 24 hour  Intake 2116.85 ml  Output 1600 ml  Net 516.85 ml    Filed Weights   01/08/23 0335 01/08/23 0531 01/09/23 0457  Weight: (!) 137.7 kg (!) 138.6 kg (!) 140 kg    Examination: General: Acutely-ill appearing male, NAD mechanically intubated  HENT: Supple, no JVD  Lungs: Diminished throughout, even, non labored  Cardiovascular: NSR, s1s2, no m/r/g, 2+ radial/2+ distal pulses, 2+ bilateral lower extremity edema  Abdomen: +BS x4, obese, soft, non distended  Extremities: Bilateral upper and lower extremity intermittent jerking  Neuro: Sedated, not  following commands, withdraws from painful stimulation, PERRL  GU: External cathter in place   Resolved Hospital Problem list     Assessment & Plan:  #Acute on chronic hypoxic hypercapnic respiratory failure secondary             To SEVERE AECOPD and acute DECOMPENSATED CHF exacerbation WITH EF >55%   #Mechanical intubation  - Full vent support for now: vent settings reviewed and established  -  Continue lung protective strategies  - Plateau pressures less than 30 cm H20 - SBT once all parameters met  - VAP bundle implemented  - Intermittent CXR and ABG's - Scheduled and prn bronchodilator therapy  - IV steroids wean as tolerated   #Elevated troponin secondary to NSTEMI vs. demand ischemia  #Acute on chronic diastolic systolic CHF  #Hypotension likely secondary to sedating medication  Hx: HTN  Echo 01/07/23: EF 65 to 70%; grade II diastolic dysfunction; trivial mitral valve regurgitation  - Continuous telemetry monitoring  - Trend troponin's until peaked  - Prn levophed gtt to maintain map >65  - Hold outpatient antihypertensives and beta-blockers for now  - Continue aspirin and atorvastatin  - Cardiology consulted appreciate input   #Mild acute kidney injury suspect secondary to diuretic  - Trend BMP and lactic acid  - Replace electrolytes as indicated  - Monitor UOP - Avoid nephrotoxic medication when able - Hold furosemide for now due to metabolic alkalosis; if pt requires diuresis will administer diamox   #UTI secondary to Ecoli  - Trend WBC and monitor fever curve  - Trend PCT  - Continue ceftriaxone  #Type II diabetes mellitus  - CBG's q4hrs  - SSI  - Hemoglobin A1c pending  #OSA- CHRONIC STABLE #Acute metabolic encephalopathy  #Possible seizure activity vs. possible myoclonus  #Mechanical intubation pain/discomfort  - CT Head and EEG pending  - Correct metabolic derangements  - Maintain RASS goal of 0 to -1 - PAD protocol to maintain RASS goal: Propofol and fentanyl gtts  - WUA daily  - Will obtain urine drug screen  - Avoid sedating medications when able   Best Practice (right click and "Reselect all SmartList Selections" daily)   Diet/type: NPO DVT prophylaxis: LMWH GI prophylaxis: H2B Lines: N/A Foley:  N/A Code Status:  full code Last date of multidisciplinary goals of care discussion [N/A]  05/15: Attempted to contact pts friend Lalla Brothers utilizing phone number listed in epic, however the phone number listed in epic is the wrong number.  No other family contact information available in epic.  Labs   CBC: Recent Labs  Lab 01/05/23 1535 01/07/23 2306 01/08/23 0431 01/09/23 0507  WBC 22.9* 6.9 7.3 8.6  NEUTROABS 20.6*  --   --   --   HGB 15.1 12.7* 13.1 12.8*  HCT 47.5 42.0 44.4 40.5  MCV 89.8 95.5 95.7 90.6  PLT 233 195 193 210     Basic Metabolic Panel: Recent Labs  Lab 01/06/23 0409 01/07/23 0221 01/07/23 2257 01/08/23 0431 01/08/23 1450 01/08/23 1457 01/09/23 0507  NA 139 137 136 136  --   --  137  K 3.6 3.7 3.9 4.1 3.8  --  3.2*  CL 93* 92* 89* 90*  --   --  91*  CO2 33* 37* 38* 38*  --   --  34*  GLUCOSE 166* 176* 182* 219*  --   --  173*  BUN 20 21 23  26*  --   --  27*  CREATININE 1.05 1.01  1.02 1.27*  --   --  1.19  CALCIUM 8.3* 8.2* 8.5* 8.7*  --   --  8.7*  MG  --   --   --   --   --  2.3 2.2  PHOS  --   --   --   --   --  2.1* 1.5*    GFR: Estimated Creatinine Clearance: 90.9 mL/min (by C-G formula based on SCr of 1.19 mg/dL). Recent Labs  Lab 01/05/23 1535 01/07/23 2306 01/08/23 0431 01/08/23 1457 01/09/23 0507  WBC 22.9* 6.9 7.3  --  8.6  LATICACIDVEN  --   --   --  1.7  --      Liver Function Tests: No results for input(s): "AST", "ALT", "ALKPHOS", "BILITOT", "PROT", "ALBUMIN" in the last 168 hours. No results for input(s): "LIPASE", "AMYLASE" in the last 168 hours. Recent Labs  Lab 01/07/23 2256  AMMONIA 45*     ABG    Component Value Date/Time   PHART 7.44 01/09/2023 1517   PCO2ART 54 (H) 01/09/2023 1517   PO2ART 62 (L) 01/09/2023 1517   HCO3 36.7 (H) 01/09/2023 1517   O2SAT 92.4 01/09/2023 1517     Coagulation Profile: Recent Labs  Lab 01/05/23 2111 01/07/23 2306  INR 1.1 1.0     Cardiac Enzymes: Recent Labs  Lab 01/08/23 1450  CKTOTAL 917*    HbA1C: Hemoglobin A1C  Date/Time Value Ref Range Status  08/12/2013 04:01 AM 6.9 (H) 4.2 - 6.3  % Final    Comment:    The American Diabetes Association recommends that a primary goal of therapy should be <7% and that physicians should reevaluate the treatment regimen in patients with HbA1c values consistently >8%.    Hgb A1c MFr Bld  Date/Time Value Ref Range Status  01/08/2023 02:57 PM 9.4 (H) 4.8 - 5.6 % Final    Comment:    (NOTE) Pre diabetes:          5.7%-6.4%  Diabetes:              >6.4%  Glycemic control for   <7.0% adults with diabetes   11/10/2022 12:36 AM 11.2 (H) 4.8 - 5.6 % Final    Comment:    (NOTE)         Prediabetes: 5.7 - 6.4         Diabetes: >6.4         Glycemic control for adults with diabetes: <7.0     CBG: Recent Labs  Lab 01/08/23 2320 01/09/23 0331 01/09/23 0731 01/09/23 1110 01/09/23 1550  GLUCAP 154* 177* 184* 230* 357*     Review of Systems:   Unable to assess pt mechanically intubated   Past Medical History:  He,  has a past medical history of CHF (congestive heart failure) (HCC), COPD (chronic obstructive pulmonary disease) (HCC), Diabetes mellitus without complication (HCC), Hypertension, and Neuropathy.   Surgical History:   Past Surgical History:  Procedure Laterality Date   LEFT HEART CATH AND CORONARY ANGIOGRAPHY Right 02/27/2018   Procedure: LEFT HEART CATH AND CORONARY ANGIOGRAPHY;  Surgeon: Laurier Nancy, MD;  Location: [facility] INVASIVE CV LAB;  Service: Cardiovascular;  Laterality: Right;     Social History:   reports that he has quit smoking. His smoking use included cigarettes. He has never used smokeless tobacco. He reports that he does not drink alcohol and does not use drugs.   Family History:  His family history includes Hypertension in his father; Stroke in his  mother.   Allergies Allergies  Allergen Reactions   Erythromycin Anaphylaxis and Swelling    Had eye swelling with erythromycin during a time when he had perf ear drum.  Tolerated azithromycin.     Home Medications  Prior to Admission  medications   Medication Sig Start Date End Date Taking? Authorizing Provider  albuterol (PROVENTIL) (2.5 MG/3ML) 0.083% nebulizer solution Take 2.5 mg by nebulization every 4 (four) hours as needed. 08/01/22 08/01/23 Yes [provider]  ASPIRIN EC 81 MG EC tablet Take 81 mg by mouth daily. 11/15/22  Yes [provider]  atorvastatin (LIPITOR) 40 MG tablet Take 40 mg by mouth daily.   Yes [provider]  budesonide-formoterol (SYMBICORT) 160-4.5 MCG/ACT inhaler Inhale 2 puffs into the lungs 2 (two) times daily.   Yes [provider]  bumetanide (BUMEX) 1 MG tablet Take 3 mg by mouth daily. 11/21/22  Yes [provider]  diclofenac Sodium (VOLTAREN) 1 % GEL Apply 2 g topically in the morning and at bedtime. 08/01/22  Yes [provider]  docusate sodium (COLACE) 100 MG capsule Take 1 capsule (100 mg total) by mouth 2 (two) times daily. 11/15/22  Yes Baron Hamper, MD  hydrOXYzine (ATARAX) 10 MG tablet Take 1 tablet (10 mg total) by mouth 3 (three) times daily as needed. Home med. 06/25/22  Yes [name], MD  insulin lispro (HUMALOG) 100 UNIT/ML KwikPen Inject 10 Units into the skin 3 (three) times daily with meals.   Yes [provider]  ipratropium-albuterol (DUONEB) 0.5-2.5 (3) MG/3ML SOLN Take 3 mLs by nebulization every 6 (six) hours as needed. 06/25/22  Yes [name], MD  isosorbide dinitrate (ISORDIL) 30 MG tablet Take 30 mg by mouth every morning. 11/14/20  Yes [provider]  LANTUS SOLOSTAR 100 UNIT/ML Solostar Pen Inject 40 Units into the skin 2 (two) times daily. 01/01/23  Yes [provider]  lisinopril (ZESTRIL) 20 MG tablet Take 20 mg by mouth daily.   Yes [provider]  magnesium oxide (MAG-OX) 400 MG tablet Take 1 tablet by mouth 2 (two) times daily. 10/24/22  Yes [provider]  metFORMIN (GLUCOPHAGE) 1000 MG tablet Take 1,000 mg by mouth daily. 06/02/20  Yes [provider]   metoprolol tartrate (LOPRESSOR) 25 MG tablet Take 25 mg by mouth daily.   Yes [provider]  PROAIR HFA 108 (90 Base) MCG/ACT inhaler Inhale 2 puffs into the lungs every 4 (four) hours as needed for wheezing. 08/18/21  Yes Wieting, Richard, MD  triamcinolone cream (KENALOG) 0.1 % Apply 1 Application topically 2 (two) times daily. 10/25/22  Yes [provider]  furosemide (LASIX) 40 MG tablet Take 1 tablet (40 mg total) by mouth 2 (two) times daily. 11/15/22 12/15/22  Baron Hamper, MD  gabapentin (NEURONTIN) 600 MG tablet Take 600 mg by mouth 3 (three) times daily. Patient not taking: Reported on 01/05/2023 04/18/22   [provider]  insulin detemir (LEVEMIR) 100 UNIT/ML FlexPen Inject 30 Units into the skin in the morning and at bedtime. Patient not taking: Reported on 01/05/2023 06/25/22   [name], MD  KLOR-CON M10 10 MEQ tablet Take 10 mEq by mouth daily. Patient not taking: Reported on 01/05/2023 10/24/22   [provider]  Selenium Sulfide 2.25 % SHAM SMARTSIG:Liberally Topical Daily Patient not taking: Reported on 01/05/2023 10/28/22   [provider]     Critical care provider statement:   Total critical care time: 33 minutes   Performed by:  Azzan Butler MD   Critical care time was exclusive of separately billable procedures and treating other patients.   Critical care was necessary to treat or prevent imminent or life-threatening deterioration.   Critical care was time spent personally by me on the following activities: development of treatment plan with patient and/or surrogate as well as nursing, discussions with consultants, evaluation of patient's response to treatment, examination of patient, obtaining history from patient or surrogate, ordering and performing treatments and interventions, ordering and review of laboratory studies, ordering and review of radiographic studies, pulse oximetry and re-evaluation of patient's condition.    Vida Rigger, M.D.  Pulmonary & Critical Care Medicine

## 2023-01-09 NOTE — TOC Initial Note (Signed)
Transition of Care [facility]) - Initial/Assessment Note    Patient Details  Name: Cody Alexander MRN: [id number] Date of Birth: 07/16/1956  Transition of Care [facility]) CM/SW Contact:    Darolyn Rua, LCSW Phone Number: 01/09/2023, 11:11 AM  Clinical Narrative:                  Patient currently intubated, noted to follow commands with poor safety awareness and judgement.   Per Alden Server with [facility] patient may qualify for LTAC level of care.   CSW reached out to contact listed in chart Delray Alt, CSW spoke with her and Foye Clock on the phone. Foye Clock identifies as Margie's granddaughter, she reports Delray Alt is 66 years old and they are friends of the pateint. She reports that patient's family has all passed away except for 1 daughter that he is disconnected from. Foye Clock reports she will work on [employer] phone number for CSW to contact, as Foye Clock reports herself and Delray Alt would not like to identify as decision makers for patient at this time.   Foye Clock can be reached at [phone] pending call back from her with daughter's information.    Barriers to Discharge: Continued Medical Work up   Patient Goals and CMS Choice Patient states their goals for this hospitalization and ongoing recovery are:: to go home CMS Medicare.gov Compare Post Acute Care list provided to:: Patient Represenative (must comment) (family)        Expected Discharge Plan and Services       Living arrangements for the past 2 months: Single Family Home                                      Prior Living Arrangements/Services Living arrangements for the past 2 months: Single Family Home Lives with:: Self                   Activities of Daily Living      Permission Sought/Granted                  Emotional Assessment              Admission diagnosis:  Respiratory distress [R06.03] Lower urinary tract infection [N39.0] NSTEMI (non-ST elevated myocardial  infarction) (HCC) [I21.4] Congestive heart failure, unspecified HF chronicity, unspecified heart failure type (HCC) [I50.9] Patient Active Problem List   Diagnosis Date Noted   History of kidney stones 01/06/2023   Hypokalemia 01/05/2023   Bilateral leg pain 11/10/2022   Chest pain    Syncope and collapse    Chronic back pain    Acute on chronic respiratory failure (HCC) 03/14/2022   Diabetes mellitus without complication (HCC)    Acute exacerbation of chronic low back pain    Urinary tract infection    HLD (hyperlipidemia) 01/27/2022   CAD (coronary artery disease) 01/27/2022   Fall at home, initial encounter 01/27/2022   Leg weakness, bilateral 01/27/2022   Hyperkalemia    Acute CHF (congestive heart failure) (HCC) 08/15/2021   Benign prostatic hyperplasia with urinary hesitancy    Lower abdominal pain    Elevated troponin    Rash    Uncontrolled type 2 diabetes mellitus with hyperglycemia (HCC) 01/04/2021   Acute on chronic diastolic CHF (congestive heart failure) (HCC) 01/04/2021   CHF (congestive heart failure), NYHA class I, acute, diastolic (HCC) 01/04/2021   OSA (obstructive sleep apnea) 01/04/2021   Obesity (BMI 30-39.9)  01/04/2021   Chronic respiratory failure with hypoxia (HCC) 01/04/2021   Acute on chronic congestive heart failure (HCC)    Acute on chronic respiratory failure with hypoxia (HCC) 06/20/2020   NSTEMI (non-ST elevated myocardial infarction) (HCC) 02/25/2018   Obesity, Class III, BMI 40-49.9 (morbid obesity) (HCC) 01/19/2017   Chronic obstructive pulmonary disease (COPD) (HCC) 05/13/2014   Type 2 diabetes mellitus with hyperglycemia, with long-term current use of insulin (HCC) 05/13/2014   Essential hypertension 05/13/2014   PCP:  [employer] Pharmacy:   [id number] [place], [place] - [address] [place] [address] Phone: [phone] Fax: [phone]  [id number] [place] [place] - [address] [place] [address] Phone: [phone] Fax: [phone]     Social Determinants of Health (SDOH) Social History: SDOH Screenings   Food Insecurity: No Food Insecurity (11/10/2022)  Housing: Low Risk  (11/10/2022)  Transportation Needs: No Transportation Needs (11/10/2022)  Utilities: Not At Risk (11/10/2022)  Tobacco Use: Medium Risk (01/05/2023)   SDOH Interventions:     Readmission Risk Interventions    06/25/2022   10:13 AM  Readmission Risk Prevention Plan  Transportation Screening Complete  PCP or Specialist Appt within 3-5 Days Complete  HRI or Home Care Consult Complete  Social Work Consult for Recovery Care Planning/Counseling Complete  Palliative Care Screening Not Applicable  Medication Review Oceanographer) Complete

## 2023-01-09 NOTE — TOC Progression Note (Signed)
Transition of Care [facility]) - Progression Note    Patient Details  Name: Cody Alexander MRN: [id number] Date of Birth: 10-06-1955  Transition of Care [facility]) CM/SW Contact  Darolyn Rua, [place] Phone Number: 01/09/2023, 1:49 PM  Clinical Narrative:     Patient's cousin Charolette Forward called this CSW (number updated in chart). She reports being only family involved, as she has attempted to contact patient's biological daughter Joni Reining and other daughter Crystal through [employer] and has been unable to connect with Joni Reining and [employer] reported not wanting to be involved.   Marylene Land informed of patient's potential LTAC eligibility, Marylene Land reports she will call RN to discuss medical status of patient and gather more information.      Barriers to Discharge: Continued Medical Work up  Expected Discharge Plan and Services       Living arrangements for the past 2 months: Single Family Home                                       Social Determinants of Health (SDOH) Interventions SDOH Screenings   Food Insecurity: No Food Insecurity (11/10/2022)  Housing: Low Risk  (11/10/2022)  Transportation Needs: No Transportation Needs (11/10/2022)  Utilities: Not At Risk (11/10/2022)  Tobacco Use: Medium Risk (01/05/2023)    Readmission Risk Interventions    06/25/2022   10:13 AM  Readmission Risk Prevention Plan  Transportation Screening Complete  PCP or Specialist Appt within 3-5 Days Complete  HRI or Home Care Consult Complete  Social Work Consult for Recovery Care Planning/Counseling Complete  Palliative Care Screening Not Applicable  Medication Review Oceanographer) Complete

## 2023-01-09 NOTE — Progress Notes (Signed)
SUBJECTIVE: Patient is a 67 y.o. 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  Patient states he had not been feeling well for a few days. On Sunday. he drove out of town with a friend, heavily exerted himself while walking around outside. Patient proceeded to drive himself home when he began to feel chest discomfort in the center of his chest, short of breath, diaphoretic, loss of bladder and bowel control. After patient arrived at home, EMS was called to bring him to the ED.    Patient is established with [facility] cardiology, is non-compliant with follow up appointments.    Overnight, patient developed acute encephalopathy with concern for hypercapnia, acute respiratory failure requiring BiPAP, hyperammonemia. Troponin levels repeated, 71>75. Repeat EKG, in sinus rhythm, no acute changes compared to EKG on arrival to ED.   Rapid response called on 01/08/23, patient developed acute encephalopathy and acute hypercapnic hypoxic respiratory failure requiring BiPAP. Patient failed BiPAP, developed jerking movements concerning for myoclonus. Patient was subsequently transferred to the ICU where he was mechanically intubated.    Vitals:   01/09/23 0500 01/09/23 0600 01/09/23 0735 01/09/23 0752  BP: 101/64 97/62    Pulse: 75 (!) 46  91  Resp: 18 18    Temp:    99.2 F (37.3 C)  TempSrc:    Axillary  SpO2: 93% 92% 93% 93%  Weight:      Height:        Intake/Output Summary (Last 24 hours) at 01/09/2023 0907 Last data filed at 01/09/2023 0600 Gross per 24 hour  Intake 1323.59 ml  Output 1000 ml  Net 323.59 ml    LABS: Basic Metabolic Panel: Recent Labs    01/08/23 0431 01/08/23 1450 01/08/23 1457 01/09/23 0507   NA 136  --   --  137  K 4.1 3.8  --  3.2*  CL 90*  --   --  91*  CO2 38*  --   --  34*  GLUCOSE 219*  --   --  173*  BUN 26*  --   --  27*  CREATININE 1.27*  --   --  1.19  CALCIUM 8.7*  --   --  8.7*  MG  --   --  2.3 2.2  PHOS  --   --  2.1* 1.5*   Liver Function Tests: No results for input(s): "AST", "ALT", "ALKPHOS", "BILITOT", "PROT", "ALBUMIN" in the last 72 hours. No results for input(s): "LIPASE", "AMYLASE" in the last 72 hours. CBC: Recent Labs    01/08/23 0431 01/09/23 0507  WBC 7.3 8.6  HGB 13.1 12.8*  HCT 44.4 40.5  MCV 95.7 90.6  PLT 193 210   Cardiac Enzymes: Recent Labs    01/08/23 1450  CKTOTAL 917*   BNP: Invalid input(s): "POCBNP" D-Dimer: Recent Labs    01/07/23 2306  DDIMER 0.51*   Hemoglobin A1C: Recent Labs    01/08/23 1457  HGBA1C 9.4*   Fasting Lipid Panel: Recent Labs    01/09/23 0507  TRIG 228*   Thyroid Function Tests: No results for input(s): "TSH", "T4TOTAL", "T3FREE", "THYROIDAB" in the last 72 hours.  Invalid input(s): "FREET3" Anemia Panel: No results for input(s): "VITAMINB12", "FOLATE", "FERRITIN", "TIBC", "  IRON", "RETICCTPCT" in the last 72 hours.   PHYSICAL EXAM General: acutely ill appearing, sedated and intubated HEENT:  Normocephalic and atramatic Neck:  No JVD.  Lungs: diminished throughout, non labored Heart: HRRR . Normal S1 and S2 without gallops or murmurs.  Abdomen: Bowel sounds are positive, abdomen soft and non-tender  Msk:  Back normal, normal gait. Normal strength and tone for age. Extremities: No clubbing, cyanosis or edema.   Neuro: sedated and intubated   TELEMETRY: sinus rhythm, HR 76 bpm  ASSESSMENT AND PLAN: Patient is a 67 y.o. male with medical history significant for combined CHF with recovered EF (EF 65% G1 DD 07/22/2022), COPD on home O2 at 4L, CAD s/p stent x1, HTN, T2DM , class III obesity, OSA who presented to the ED on 01/05/23 for chest discomfort and shortness of breath. Echo  01/07/23 revealed EF 65-70%, severe LVH, normal wall motion, grade II DD. Furosemide currently being held due to metabolic alkalosis. Respiratory distress likely from COPD exacerbation. Will continue to follow.    ICD-10-CM   1. Respiratory distress  R06.03     2. Congestive heart failure, unspecified HF chronicity, unspecified heart failure type (HCC)  I50.9     3. Lower urinary tract infection  N39.0       Principal Problem:   NSTEMI (non-ST elevated myocardial infarction) (HCC) Active Problems:   Chronic obstructive pulmonary disease (COPD) (HCC)   Type 2 diabetes mellitus with hyperglycemia, with long-term current use of insulin (HCC)   Essential hypertension   Obesity, Class III, BMI 40-49.9 (morbid obesity) (HCC)   Acute on chronic respiratory failure with hypoxia (HCC)   Acute on chronic diastolic CHF (congestive heart failure) (HCC)   OSA (obstructive sleep apnea)   Benign prostatic hyperplasia with urinary hesitancy   Urinary tract infection   Hypokalemia   History of kidney stones    Mariesha Venturella, FNP-C 01/09/2023 9:07 AM

## 2023-01-09 NOTE — Progress Notes (Signed)
Notified by RN pt has abnormal EKG changes.  Upon review of EKG pt noted to have new ST elevation in multiple leads also present in precordial leads.  Stat troponin pending.  I initially contacted on call Cardiologist Dr. Mariah Milling who reviewed EKG, and he was concerned EKG changes could be related to pericarditis and agreed with stat troponin.  However, upon placing an official consult for [facility] Dr. Mariah Milling informed me [facility]ologist Dr. Welton Flakes has been assisting with pt management.  Therefore, Dr. Welton Flakes and Cardiology PA Amber Scoggin's notified via secure chat in epic regarding EKG findings and to determine if pt needs to go to the cardiac cath lab.  Dr. Welton Flakes has received secure epic message awaiting recommendations.    Zada Girt, AGNP  Pulmonary/Critical Care Pager [phone] (please enter 7 digits) PCCM Consult Pager [phone] (please enter 7 digits)

## 2023-01-09 NOTE — Progress Notes (Signed)
Abnormal EKG, Cody Alexander made aware. Orders placed stat troponin, consult cardio

## 2023-01-10 ENCOUNTER — Inpatient Hospital Stay: Payer: 59

## 2023-01-10 DIAGNOSIS — I214 Non-ST elevation (NSTEMI) myocardial infarction: Secondary | ICD-10-CM | POA: Diagnosis not present

## 2023-01-10 DIAGNOSIS — R0603 Acute respiratory distress: Secondary | ICD-10-CM | POA: Diagnosis not present

## 2023-01-10 DIAGNOSIS — R079 Chest pain, unspecified: Secondary | ICD-10-CM | POA: Diagnosis not present

## 2023-01-10 LAB — BASIC METABOLIC PANEL
Anion gap: 12 (ref 5–15)
BUN: 28 mg/dL — ABNORMAL HIGH (ref 8–23)
CO2: 29 mmol/L (ref 22–32)
Calcium: 9.1 mg/dL (ref 8.9–10.3)
Chloride: 95 mmol/L — ABNORMAL LOW (ref 98–111)
Creatinine, Ser: 1.01 mg/dL (ref 0.61–1.24)
GFR, Estimated: >60 mL/min (ref >60)
Glucose, Bld: 251 mg/dL — ABNORMAL HIGH (ref 70–99)
Potassium: 3.1 mmol/L — ABNORMAL LOW (ref 3.5–5.1)
Sodium: 136 mmol/L (ref 135–145)

## 2023-01-10 LAB — GLUCOSE, CAPILLARY
Glucose-Capillary: 341 mg/dL — ABNORMAL HIGH (ref 70–99)
Glucose-Capillary: 288 mg/dL — ABNORMAL HIGH (ref 70–99)
Glucose-Capillary: 250 mg/dL — ABNORMAL HIGH (ref 70–99)
Glucose-Capillary: 197 mg/dL — ABNORMAL HIGH (ref 70–99)
Glucose-Capillary: 213 mg/dL — ABNORMAL HIGH (ref 70–99)
Glucose-Capillary: 205 mg/dL — ABNORMAL HIGH (ref 70–99)
Glucose-Capillary: 213 mg/dL — ABNORMAL HIGH (ref 70–99)
Glucose-Capillary: 163 mg/dL — ABNORMAL HIGH (ref 70–99)
Glucose-Capillary: 168 mg/dL — ABNORMAL HIGH (ref 70–99)
Glucose-Capillary: 200 mg/dL — ABNORMAL HIGH (ref 70–99)
Glucose-Capillary: 160 mg/dL — ABNORMAL HIGH (ref 70–99)
Glucose-Capillary: 158 mg/dL — ABNORMAL HIGH (ref 70–99)
Glucose-Capillary: 193 mg/dL — ABNORMAL HIGH (ref 70–99)
Glucose-Capillary: 239 mg/dL — ABNORMAL HIGH (ref 70–99)
Glucose-Capillary: 323 mg/dL — ABNORMAL HIGH (ref 70–99)
Glucose-Capillary: 297 mg/dL — ABNORMAL HIGH (ref 70–99)

## 2023-01-10 LAB — CBC
HCT: 44.6 % (ref 39.0–52.0)
Hemoglobin: 14.7 g/dL (ref 13.0–17.0)
MCH: 28.8 pg (ref 26.0–34.0)
MCHC: 33 g/dL (ref 30.0–36.0)
MCV: 87.5 fL (ref 80.0–100.0)
Platelets: 229 10*3/uL (ref 150–400)
RBC: 5.1 MIL/uL (ref 4.22–5.81)
RDW: 13.2 % (ref 11.5–15.5)
WBC: 17.5 10*3/uL — ABNORMAL HIGH (ref 4.0–10.5)
nRBC: 0 % (ref 0.0–0.2)

## 2023-01-10 LAB — TROPONIN I (HIGH SENSITIVITY): Troponin I (High Sensitivity): 62 ng/L — ABNORMAL HIGH (ref <18)

## 2023-01-10 LAB — PHOSPHORUS: Phosphorus: 2.7 mg/dL (ref 2.5–4.6)

## 2023-01-10 LAB — CULTURE, RESPIRATORY W GRAM STAIN

## 2023-01-10 LAB — MAGNESIUM: Magnesium: 2.7 mg/dL — ABNORMAL HIGH (ref 1.7–2.4)

## 2023-01-10 MED ORDER — FENTANYL CITRATE (PF) 100 MCG/2ML IJ SOLN
100.0000 ug | Freq: Once | INTRAMUSCULAR | Status: AC
  Administered 2023-01-10: 100 ug via INTRAVENOUS

## 2023-01-10 MED ORDER — POTASSIUM CHLORIDE 20 MEQ PO PACK
40.0000 meq | PACK | Freq: Once | ORAL | Status: AC
  Administered 2023-01-10: 40 meq
  Filled 2023-01-10: qty 2

## 2023-01-10 MED ORDER — INSULIN ASPART 100 UNIT/ML IJ SOLN
0.0000 [IU] | INTRAMUSCULAR | Status: DC
  Administered 2023-01-10: 8 [IU] via SUBCUTANEOUS
  Administered 2023-01-10: 11 [IU] via SUBCUTANEOUS
  Administered 2023-01-11: 5 [IU] via SUBCUTANEOUS
  Administered 2023-01-11: 8 [IU] via SUBCUTANEOUS
  Administered 2023-01-11: 5 [IU] via SUBCUTANEOUS
  Administered 2023-01-11: 3 [IU] via SUBCUTANEOUS
  Administered 2023-01-11: 11 [IU] via SUBCUTANEOUS
  Administered 2023-01-11 – 2023-01-13 (×8): 3 [IU] via SUBCUTANEOUS
  Administered 2023-01-13: 11 [IU] via SUBCUTANEOUS
  Administered 2023-01-13: 5 [IU] via SUBCUTANEOUS
  Administered 2023-01-13: 3 [IU] via SUBCUTANEOUS
  Filled 2023-01-10 (×18): qty 1

## 2023-01-10 MED ORDER — MIDAZOLAM HCL 2 MG/2ML IJ SOLN
INTRAMUSCULAR | Status: AC
  Filled 2023-01-10: qty 2

## 2023-01-10 MED ORDER — INSULIN ASPART 100 UNIT/ML IJ SOLN
10.0000 [IU] | INTRAMUSCULAR | Status: DC
  Administered 2023-01-10 – 2023-01-15 (×32): 10 [IU] via SUBCUTANEOUS
  Filled 2023-01-10 (×33): qty 1

## 2023-01-10 MED ORDER — PROPOFOL 1000 MG/100ML IV EMUL
5.0000 ug/kg/min | INTRAVENOUS | Status: DC
  Administered 2023-01-10: 5 ug/kg/min via INTRAVENOUS
  Administered 2023-01-10 – 2023-01-11 (×2): 20 ug/kg/min via INTRAVENOUS
  Administered 2023-01-11: 40 ug/kg/min via INTRAVENOUS
  Administered 2023-01-11 (×2): 20 ug/kg/min via INTRAVENOUS
  Administered 2023-01-12: 25 ug/kg/min via INTRAVENOUS
  Administered 2023-01-12 (×3): 20 ug/kg/min via INTRAVENOUS
  Administered 2023-01-12 – 2023-01-13 (×3): 30 ug/kg/min via INTRAVENOUS
  Administered 2023-01-13: 20 ug/kg/min via INTRAVENOUS
  Administered 2023-01-13: 30 ug/kg/min via INTRAVENOUS
  Administered 2023-01-13 – 2023-01-14 (×2): 20 ug/kg/min via INTRAVENOUS
  Administered 2023-01-14: 30 ug/kg/min via INTRAVENOUS
  Administered 2023-01-14: 15 ug/kg/min via INTRAVENOUS
  Administered 2023-01-14 – 2023-01-15 (×2): 20 ug/kg/min via INTRAVENOUS
  Administered 2023-01-15: 15 ug/kg/min via INTRAVENOUS
  Administered 2023-01-15: 14.881 ug/kg/min via INTRAVENOUS
  Administered 2023-01-16 (×2): 15 ug/kg/min via INTRAVENOUS
  Filled 2023-01-10 (×26): qty 100

## 2023-01-10 MED ORDER — MIDAZOLAM HCL 2 MG/2ML IJ SOLN
1.0000 mg | INTRAMUSCULAR | Status: DC | PRN
  Administered 2023-01-10 – 2023-01-15 (×11): 2 mg via INTRAVENOUS
  Filled 2023-01-10 (×14): qty 2

## 2023-01-10 MED ORDER — MIDAZOLAM HCL 2 MG/2ML IJ SOLN: 2.0000 mg | Freq: Once | INTRAMUSCULAR | Status: DC

## 2023-01-10 MED ORDER — INSULIN ASPART 100 UNIT/ML IJ SOLN
3.0000 [IU] | INTRAMUSCULAR | Status: DC
  Administered 2023-01-10: 6 [IU] via SUBCUTANEOUS
  Filled 2023-01-10: qty 1

## 2023-01-10 MED ORDER — MIDAZOLAM HCL 2 MG/2ML IJ SOLN
INTRAMUSCULAR | Status: AC
  Administered 2023-01-10: 2 mg via INTRAVENOUS
  Filled 2023-01-10: qty 2

## 2023-01-10 MED ORDER — INSULIN DETEMIR 100 UNIT/ML ~~LOC~~ SOLN
30.0000 [IU] | Freq: Two times a day (BID) | SUBCUTANEOUS | Status: DC
  Administered 2023-01-10 – 2023-01-15 (×12): 30 [IU] via SUBCUTANEOUS
  Filled 2023-01-10 (×14): qty 0.3

## 2023-01-10 MED ORDER — METHYLPREDNISOLONE SODIUM SUCC 125 MG IJ SOLR
80.0000 mg | Freq: Every day | INTRAMUSCULAR | Status: DC
  Administered 2023-01-10 – 2023-01-11 (×2): 80 mg via INTRAVENOUS
  Filled 2023-01-10 (×2): qty 2

## 2023-01-10 MED ORDER — POTASSIUM CHLORIDE 10 MEQ/100ML IV SOLN
10.0000 meq | INTRAVENOUS | Status: AC
  Administered 2023-01-10 (×4): 10 meq via INTRAVENOUS
  Filled 2023-01-10 (×4): qty 100

## 2023-01-10 MED ORDER — DEXTROSE 10 % IV SOLN: INTRAVENOUS | Status: DC | PRN

## 2023-01-10 MED ORDER — MIDAZOLAM HCL 2 MG/2ML IJ SOLN
2.0000 mg | Freq: Once | INTRAMUSCULAR | Status: AC
  Administered 2023-01-10: 2 mg via INTRAVENOUS

## 2023-01-10 NOTE — Progress Notes (Signed)
SUBJECTIVE: Patient is a 67 y.o. 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  Patient states he had not been feeling well for a few days. On Sunday. he drove out of town with a friend, heavily exerted himself while walking around outside. Patient proceeded to drive himself home when he began to feel chest discomfort in the center of his chest, short of breath, diaphoretic, loss of bladder and bowel control. After patient arrived at home, EMS was called to bring him to the ED.    Patient is established with [facility] cardiology, is non-compliant with follow up appointments.    Overnight, patient developed acute encephalopathy with concern for hypercapnia, acute respiratory failure requiring BiPAP, hyperammonemia. Troponin levels repeated, 71>75. Repeat EKG, in sinus rhythm, no acute changes compared to EKG on arrival to ED.    Rapid response called on 01/08/23, patient developed acute encephalopathy and acute hypercapnic hypoxic respiratory failure requiring BiPAP. Patient failed BiPAP, developed jerking movements concerning for myoclonus. Patient was subsequently transferred to the ICU where he was mechanically intubated.    01/09/23 EKG performed for ST elevation on monitor. EKG showed no significant ST elevation inferolaterally. Troponin levels repeated, 84>78>65>62.    Vitals:   01/10/23 0537 01/10/23 0600 01/10/23 0718 01/10/23 0745  BP:  127/79  118/75  Pulse: 73 72  74  Resp: (!) 22 (!) 22  17  Temp:    97.9 F (36.6 C)  TempSrc:    Oral  SpO2: 95% 95% 95% 93%  Weight:      Height:        Intake/Output Summary (Last 24 hours) at 01/10/2023 0902 Last data filed at 01/10/2023 0800 Gross per 24 hour  Intake  2494.03 ml  Output 2550 ml  Net -55.97 ml    LABS: Basic Metabolic Panel: Recent Labs    01/09/23 0507 01/09/23 1611 01/09/23 2051 01/10/23 0319  NA 137  --   --  136  K 3.2* 3.3* 3.3* 3.1*  CL 91*  --   --  95*  CO2 34*  --   --  29  GLUCOSE 173*  --   --  251*  BUN 27*  --   --  28*  CREATININE 1.19  --   --  1.01  CALCIUM 8.7*  --   --  9.1  MG 2.2 2.5*  --  2.7*  PHOS 1.5* 5.1* 3.9 2.7   Liver Function Tests: Recent Labs    01/09/23 2051  AST 36  ALT 23  ALKPHOS 52  BILITOT 1.0  PROT 7.1  ALBUMIN 3.6   No results for input(s): "LIPASE", "AMYLASE" in the last 72 hours. CBC: Recent Labs    01/09/23 0507 01/10/23 0319  WBC 8.6 17.5*  HGB 12.8* 14.7  HCT 40.5 44.6  MCV 90.6 87.5  PLT 210 229   Cardiac Enzymes: Recent Labs    01/08/23 1450  CKTOTAL 917*   BNP: Invalid input(s): "POCBNP" D-Dimer: Recent Labs    01/07/23 2306  DDIMER 0.51*   Hemoglobin A1C: Recent Labs    01/08/23 1457  HGBA1C 9.4*   Fasting Lipid Panel: Recent Labs    01/09/23 0507  TRIG 228*   Thyroid Function  Tests: No results for input(s): "TSH", "T4TOTAL", "T3FREE", "THYROIDAB" in the last 72 hours.  Invalid input(s): "FREET3" Anemia Panel: No results for input(s): "VITAMINB12", "FOLATE", "FERRITIN", "TIBC", "IRON", "RETICCTPCT" in the last 72 hours.   PHYSICAL EXAM General: acutely ill appearing, mechanically intubated HEENT:  Normocephalic and atramatic Neck:  No JVD.  Lungs:  diminished throughout, even, non labored  Heart: HRRR . Normal S1 and S2 without gallops or murmurs.  Abdomen: Bowel sounds are positive, abdomen soft and non-tender  Extremities: No clubbing, cyanosis or edema.   Neuro: sedated  TELEMETRY: sinus rhythm, HR 72 bpm  ASSESSMENT AND PLAN: Patient is a 67 y.o. male with medical history significant for combined CHF with recovered EF (EF 65% G1 DD 07/22/2022), COPD on home O2 at 4L, CAD s/p stent x1, HTN, T2DM , class III obesity, OSA  who presented to the ED on 01/05/23 for chest discomfort and shortness of breath. Echo 01/07/23 revealed EF 65-70%, severe LVH, normal wall motion, grade II DD. Troponin levels continue to be flat. Respiratory distress likely from COPD exacerbation. Will continue to follow.    ICD-10-CM   1. Respiratory distress  R06.03     2. Congestive heart failure, unspecified HF chronicity, unspecified heart failure type (HCC)  I50.9     3. Lower urinary tract infection  N39.0       Principal Problem:   NSTEMI (non-ST elevated myocardial infarction) (HCC) Active Problems:   Chronic obstructive pulmonary disease (COPD) (HCC)   Type 2 diabetes mellitus with hyperglycemia, with long-term current use of insulin (HCC)   Essential hypertension   Obesity, Class III, BMI 40-49.9 (morbid obesity) (HCC)   Acute on chronic respiratory failure with hypoxia (HCC)   Acute on chronic diastolic CHF (congestive heart failure) (HCC)   OSA (obstructive sleep apnea)   Benign prostatic hyperplasia with urinary hesitancy   Urinary tract infection   Hypokalemia   History of kidney stones    Cody Odonnell, FNP-C 01/10/2023 9:02 AM

## 2023-01-10 NOTE — Progress Notes (Signed)
Sedation slowly weaned down this morning for trial wake up assessment. SBT failed. Patient unable to follow commands, very agitated, not tolerating vent, sats decreased to upper 70s. Sedation resumed. Boluses given. MD made aware. Will continue to monitor.    ST elevation noted. EKG obtained. NP made aware of EKG results. No new orders. Will continue to monitor.

## 2023-01-10 NOTE — Progress Notes (Signed)
NAME:  Cody Alexander, MRN:  [id number], DOB:  December 18, 1955, LOS: 5 ADMISSION DATE:  01/05/2023, CONSULTATION DATE: 01/08/2023 REFERRING MD: Dr. Georgeann Oppenheim, CHIEF COMPLAINT: AMS    History of Present Illness:  This is a 67 yo male with a PMH of COPD and Chronic Systolic Diastolic CHF.  He presented to [facility] ER on 05/12 from home via EMS with c/o shortness of breath onset 05/12.  Per ER notes EMS reported pt wears 4L O2 chronically, however he has been noncompliant with his oxygen.  When EMS arrival at pts home he was noted to be diaphoretic with initial O2 sats @86 % on 4L O2.    ED Course  Upon arrival to the ER pt alert/oriented but remained diaphoretic/tachypneic requiring 15L NRB with resolution of hypoxia.  Pt placed on Bipap.  Pt reported he had some lower extremity swelling and inability to void.  CXR concerning for mild interstitial edema.  CT Renal stone study revealed no nephrolithiasis, ureterolithiasis, or obstructive uropathy but a small amount of gas present in the bladder presumably related to procedure and small bladder diverticula.  CTA Chest negative for PE or acute pulmonary parenchymal findings.  UA concerning for possible UTI.  Pt received cefepime.  He was subsequently admitted to the progressive care unit for additional workup and treatment.  See detailed hospital course below under significant events   Pertinent  Medical History  COPD Chronic O2 @4L   Type II Diabetes Mellitus  Essential HTN  Neuropathy  Chronic Systolic Diastolic CHF  OSA  MI  Obesity  Substance Abuse   Significant Hospital Events: Including procedures, antibiotic start and stop dates in addition to other pertinent events   05/13: Pt admitted to the progressive care unit with acute on chronic hypoxic respiratory failure secondary to acute on chronic CHF exacerbation, UTI, and NSTEMI  05/15: Pt developed acute encephalopathy and acute hypercapnic hypoxic respiratory failure overnight requiring portable  Bipap.  However, pt failed Bipap developed jerking movements concerning for myoclonus requiring transfer to ICU. PCCM team consulted and pt mechanically intubated  01/09/23- patient had abnormal EKG today but repeat cardiac biomarkers seemed to be normal. He is on MV today he is not ready for SBT.  We did deliver diamox today to help with acid base disorder.  01/10/23- for SBT today, on insulin gtt, electrolytes are improved. Reducing steroids today.   Interim History / Subjective:  Pt sedated following mechanical intubation post intubation jerking movements subsided   Objective   Blood pressure 118/75, pulse 72, temperature 97.9 F (36.6 C), temperature source Oral, resp. rate (!) 22, height 6\' 1"  (1.854 m), weight (!) 140 kg, SpO2 95 %.    Vent Mode: PRVC FiO2 (%):  [40 %-45 %] 40 % Set Rate:  [22 bmp] 22 bmp Vt Set:  [550 mL] 550 mL PEEP:  [8 cmH20] 8 cmH20 Plateau Pressure:  [20 cmH20] 20 cmH20   Intake/Output Summary (Last 24 hours) at 01/10/2023 0835 Last data filed at 01/10/2023 0537 Gross per 24 hour  Intake 2215.39 ml  Output 2550 ml  Net -334.61 ml    Filed Weights   01/08/23 0335 01/08/23 0531 01/09/23 0457  Weight: (!) 137.7 kg (!) 138.6 kg (!) 140 kg    Examination: General: Acutely-ill appearing male, NAD mechanically intubated  HENT: Supple, no JVD  Lungs: Diminished throughout, even, non labored  Cardiovascular: NSR, s1s2, no m/r/g, 2+ radial/2+ distal pulses, 2+ bilateral lower extremity edema  Abdomen: +BS x4, obese, soft, non distended  Extremities:  Bilateral upper and lower extremity intermittent jerking  Neuro: Sedated, not following commands, withdraws from painful stimulation, PERRL  GU: External cathter in place   Resolved Hospital Problem list     Assessment & Plan:  #Acute on chronic hypoxic hypercapnic respiratory failure secondary             To SEVERE AECOPD and acute DECOMPENSATED CHF exacerbation WITH EF >55%   #Mechanical intubation  - Full  vent support for now: vent settings reviewed and established  - Continue lung protective strategies  - Plateau pressures less than 30 cm H20 - SBT once all parameters met  - VAP bundle implemented  - Intermittent CXR and ABG's - Scheduled and prn bronchodilator therapy  - IV steroids wean as tolerated   #Elevated troponin secondary to NSTEMI vs. demand ischemia  #Acute on chronic diastolic systolic CHF  #Hypotension likely secondary to sedating medication  Hx: HTN  Echo 01/07/23: EF 65 to 70%; grade II diastolic dysfunction; trivial mitral valve regurgitation  - Continuous telemetry monitoring  - Trend troponin's until peaked  - Prn levophed gtt to maintain map >65  - Hold outpatient antihypertensives and beta-blockers for now  - Continue aspirin and atorvastatin  - Cardiology consulted appreciate input   #Mild acute kidney injury suspect secondary to diuretic  - Trend BMP and lactic acid  - Replace electrolytes as indicated  - Monitor UOP - Avoid nephrotoxic medication when able - Hold furosemide for now due to metabolic alkalosis; if pt requires diuresis will administer diamox   #UTI secondary to Ecoli  - Trend WBC and monitor fever curve  - Trend PCT  - Continue ceftriaxone  #Type II diabetes mellitus  - CBG's q4hrs  - SSI  - Hemoglobin A1c pending  #OSA- CHRONIC STABLE #Acute metabolic encephalopathy  #Possible seizure activity vs. possible myoclonus  #Mechanical intubation pain/discomfort  - CT Head and EEG pending  - Correct metabolic derangements  - Maintain RASS goal of 0 to -1 - PAD protocol to maintain RASS goal: Propofol and fentanyl gtts  - WUA daily  - Will obtain urine drug screen  - Avoid sedating medications when able   Best Practice (right click and "Reselect all SmartList Selections" daily)   Diet/type: NPO DVT prophylaxis: LMWH GI prophylaxis: H2B Lines: N/A Foley:  N/A Code Status:  full code Last date of multidisciplinary goals of care  discussion [N/A]  05/15: Attempted to contact pts friend Lalla Brothers utilizing phone number listed in epic, however the phone number listed in epic is the wrong number.  No other family contact information available in epic.  Labs   CBC: Recent Labs  Lab 01/05/23 1535 01/07/23 2306 01/08/23 0431 01/09/23 0507 01/10/23 0319  WBC 22.9* 6.9 7.3 8.6 17.5*  NEUTROABS 20.6*  --   --   --   --   HGB 15.1 12.7* 13.1 12.8* 14.7  HCT 47.5 42.0 44.4 40.5 44.6  MCV 89.8 95.5 95.7 90.6 87.5  PLT 233 195 193 210 229     Basic Metabolic Panel: Recent Labs  Lab 01/07/23 0221 01/07/23 2257 01/08/23 0431 01/08/23 1450 01/08/23 1457 01/09/23 0507 01/09/23 1611 01/09/23 2051 01/10/23 0319  NA 137 136 136  --   --  137  --   --  136  K 3.7 3.9 4.1 3.8  --  3.2* 3.3* 3.3* 3.1*  CL 92* 89* 90*  --   --  91*  --   --  95*  CO2 37* 38*  38*  --   --  34*  --   --  29  GLUCOSE 176* 182* 219*  --   --  173*  --   --  251*  BUN 21 23 26*  --   --  27*  --   --  28*  CREATININE 1.01 1.02 1.27*  --   --  1.19  --   --  1.01  CALCIUM 8.2* 8.5* 8.7*  --   --  8.7*  --   --  9.1  MG  --   --   --   --  2.3 2.2 2.5*  --  2.7*  PHOS  --   --   --   --  2.1* 1.5* 5.1* 3.9 2.7    GFR: Estimated Creatinine Clearance: 107.2 mL/min (by C-G formula based on SCr of 1.01 mg/dL). Recent Labs  Lab 01/07/23 2306 01/08/23 0431 01/08/23 1457 01/09/23 0507 01/10/23 0319  WBC 6.9 7.3  --  8.6 17.5*  LATICACIDVEN  --   --  1.7  --   --      Liver Function Tests: Recent Labs  Lab 01/09/23 2051  AST 36  ALT 23  ALKPHOS 52  BILITOT 1.0  PROT 7.1  ALBUMIN 3.6   No results for input(s): "LIPASE", "AMYLASE" in the last 168 hours. Recent Labs  Lab 01/07/23 2256  AMMONIA 45*     ABG    Component Value Date/Time   PHART 7.44 01/09/2023 1517   PCO2ART 54 (H) 01/09/2023 1517   PO2ART 62 (L) 01/09/2023 1517   HCO3 36.7 (H) 01/09/2023 1517   O2SAT 92.4 01/09/2023 1517     Coagulation  Profile: Recent Labs  Lab 01/05/23 2111 01/07/23 2306  INR 1.1 1.0     Cardiac Enzymes: Recent Labs  Lab 01/08/23 1450  CKTOTAL 917*     HbA1C: Hemoglobin A1C  Date/Time Value Ref Range Status  08/12/2013 04:01 AM 6.9 (H) 4.2 - 6.3 % Final    Comment:    The American Diabetes Association recommends that a primary goal of therapy should be <7% and that physicians should reevaluate the treatment regimen in patients with HbA1c values consistently >8%.    Hgb A1c MFr Bld  Date/Time Value Ref Range Status  01/08/2023 02:57 PM 9.4 (H) 4.8 - 5.6 % Final    Comment:    (NOTE) Pre diabetes:          5.7%-6.4%  Diabetes:              >6.4%  Glycemic control for   <7.0% adults with diabetes   11/10/2022 12:36 AM 11.2 (H) 4.8 - 5.6 % Final    Comment:    (NOTE)         Prediabetes: 5.7 - 6.4         Diabetes: >6.4         Glycemic control for adults with diabetes: <7.0     CBG: Recent Labs  Lab 01/10/23 0323 01/10/23 0421 01/10/23 0551 01/10/23 0655 01/10/23 0739  GLUCAP 250* 197* 213* 205* 213*     Review of Systems:   Unable to assess pt mechanically intubated   Past Medical History:  He,  has a past medical history of CHF (congestive heart failure) (HCC), COPD (chronic obstructive pulmonary disease) (HCC), Diabetes mellitus without complication (HCC), Hypertension, and Neuropathy.   Surgical History:   Past Surgical History:  Procedure Laterality Date   LEFT HEART CATH AND CORONARY ANGIOGRAPHY  Right 02/27/2018   Procedure: LEFT HEART CATH AND CORONARY ANGIOGRAPHY;  Surgeon: Laurier Nancy, MD;  Location: [facility] INVASIVE CV LAB;  Service: Cardiovascular;  Laterality: Right;     Social History:   reports that he has quit smoking. His smoking use included cigarettes. He has never used smokeless tobacco. He reports that he does not drink alcohol and does not use drugs.   Family History:  His family history includes Hypertension in his father; Stroke in  his mother.   Allergies Allergies  Allergen Reactions   Erythromycin Anaphylaxis and Swelling    Had eye swelling with erythromycin during a time when he had perf ear drum.  Tolerated azithromycin.     Home Medications  Prior to Admission medications   Medication Sig Start Date End Date Taking? Authorizing Provider  albuterol (PROVENTIL) (2.5 MG/3ML) 0.083% nebulizer solution Take 2.5 mg by nebulization every 4 (four) hours as needed. 08/01/22 08/01/23 Yes [provider]  ASPIRIN EC 81 MG EC tablet Take 81 mg by mouth daily. 11/15/22  Yes [provider]  atorvastatin (LIPITOR) 40 MG tablet Take 40 mg by mouth daily.   Yes [provider]  budesonide-formoterol (SYMBICORT) 160-4.5 MCG/ACT inhaler Inhale 2 puffs into the lungs 2 (two) times daily.   Yes [provider]  bumetanide (BUMEX) 1 MG tablet Take 3 mg by mouth daily. 11/21/22  Yes [provider]  diclofenac Sodium (VOLTAREN) 1 % GEL Apply 2 g topically in the morning and at bedtime. 08/01/22  Yes [provider]  docusate sodium (COLACE) 100 MG capsule Take 1 capsule (100 mg total) by mouth 2 (two) times daily. 11/15/22  Yes Baron Hamper, MD  hydrOXYzine (ATARAX) 10 MG tablet Take 1 tablet (10 mg total) by mouth 3 (three) times daily as needed. Home med. 06/25/22  Yes [name], MD  insulin lispro (HUMALOG) 100 UNIT/ML KwikPen Inject 10 Units into the skin 3 (three) times daily with meals.   Yes [provider]  ipratropium-albuterol (DUONEB) 0.5-2.5 (3) MG/3ML SOLN Take 3 mLs by nebulization every 6 (six) hours as needed. 06/25/22  Yes [name], MD  isosorbide dinitrate (ISORDIL) 30 MG tablet Take 30 mg by mouth every morning. 11/14/20  Yes [provider]  LANTUS SOLOSTAR 100 UNIT/ML Solostar Pen Inject 40 Units into the skin 2 (two) times daily. 01/01/23  Yes [provider]  lisinopril (ZESTRIL) 20 MG tablet Take 20 mg by mouth daily.   Yes [provider]  magnesium oxide (MAG-OX) 400 MG tablet Take 1 tablet by mouth 2 (two) times daily. 10/24/22  Yes [provider]  metFORMIN (GLUCOPHAGE) 1000 MG tablet Take 1,000 mg by mouth daily. 06/02/20  Yes [provider]  metoprolol tartrate (LOPRESSOR) 25 MG tablet Take 25 mg by mouth daily.   Yes [provider]  PROAIR HFA 108 (90 Base) MCG/ACT inhaler Inhale 2 puffs into the lungs every 4 (four) hours as needed for wheezing. 08/18/21  Yes Wieting, Richard, MD  triamcinolone cream (KENALOG) 0.1 % Apply 1 Application topically 2 (two) times daily. 10/25/22  Yes [provider]  furosemide (LASIX) 40 MG tablet Take 1 tablet (40 mg total) by mouth 2 (two) times daily. 11/15/22 12/15/22  Baron Hamper, MD  gabapentin (NEURONTIN) 600 MG tablet Take 600 mg by mouth 3 (three) times daily. Patient not taking: Reported on 01/05/2023 04/18/22   [provider]  insulin detemir (LEVEMIR) 100 UNIT/ML FlexPen Inject 30 Units into the skin  in the morning and at bedtime. Patient not taking: Reported on 01/05/2023 06/25/22   [name], MD  KLOR-CON M10 10 MEQ tablet Take 10 mEq by mouth daily. Patient not taking: Reported on 01/05/2023 10/24/22   [provider]  Selenium Sulfide 2.25 % SHAM SMARTSIG:Liberally Topical Daily Patient not taking: Reported on 01/05/2023 10/28/22   [provider]     Critical care provider statement:   Total critical care time: 33 minutes   Performed by: Karna Christmas MD   Critical care time was exclusive of separately billable procedures and treating other patients.   Critical care was necessary to treat or prevent imminent or life-threatening deterioration.   Critical care was time spent personally by me on the following activities: development of treatment plan with patient and/or surrogate as well as nursing, discussions with consultants, evaluation of patient's response to treatment, examination of patient, obtaining  history from patient or surrogate, ordering and performing treatments and interventions, ordering and review of laboratory studies, ordering and review of radiographic studies, pulse oximetry and re-evaluation of patient's condition.    Vida Rigger, M.D.  Pulmonary & Critical Care Medicine

## 2023-01-11 LAB — CBC
HCT: 39.7 % (ref 39.0–52.0)
Hemoglobin: 12.7 g/dL — ABNORMAL LOW (ref 13.0–17.0)
MCH: 28.7 pg (ref 26.0–34.0)
MCHC: 32 g/dL (ref 30.0–36.0)
MCV: 89.8 fL (ref 80.0–100.0)
Platelets: 226 10*3/uL (ref 150–400)
RBC: 4.42 MIL/uL (ref 4.22–5.81)
RDW: 14 % (ref 11.5–15.5)
WBC: 15.5 10*3/uL — ABNORMAL HIGH (ref 4.0–10.5)
nRBC: 0 % (ref 0.0–0.2)

## 2023-01-11 LAB — BASIC METABOLIC PANEL
Anion gap: 7 (ref 5–15)
BUN: 53 mg/dL — ABNORMAL HIGH (ref 8–23)
CO2: 27 mmol/L (ref 22–32)
Calcium: 8.7 mg/dL — ABNORMAL LOW (ref 8.9–10.3)
Chloride: 104 mmol/L (ref 98–111)
Creatinine, Ser: 1.09 mg/dL (ref 0.61–1.24)
GFR, Estimated: >60 mL/min (ref >60)
Glucose, Bld: 239 mg/dL — ABNORMAL HIGH (ref 70–99)
Potassium: 4.6 mmol/L (ref 3.5–5.1)
Sodium: 138 mmol/L (ref 135–145)

## 2023-01-11 LAB — GLUCOSE, CAPILLARY
Glucose-Capillary: 189 mg/dL — ABNORMAL HIGH (ref 70–99)
Glucose-Capillary: 199 mg/dL — ABNORMAL HIGH (ref 70–99)
Glucose-Capillary: 225 mg/dL — ABNORMAL HIGH (ref 70–99)
Glucose-Capillary: 238 mg/dL — ABNORMAL HIGH (ref 70–99)
Glucose-Capillary: 294 mg/dL — ABNORMAL HIGH (ref 70–99)
Glucose-Capillary: 311 mg/dL — ABNORMAL HIGH (ref 70–99)

## 2023-01-11 LAB — CULTURE, RESPIRATORY W GRAM STAIN: Culture: NORMAL

## 2023-01-11 LAB — TRIGLYCERIDES: Triglycerides: 120 mg/dL (ref <150)

## 2023-01-11 MED ORDER — IPRATROPIUM-ALBUTEROL 0.5-2.5 (3) MG/3ML IN SOLN
3.0000 mL | Freq: Four times a day (QID) | RESPIRATORY_TRACT | Status: DC
  Administered 2023-01-12 – 2023-01-19 (×30): 3 mL via RESPIRATORY_TRACT
  Filled 2023-01-11 (×31): qty 3

## 2023-01-11 MED ORDER — METHYLPREDNISOLONE SODIUM SUCC 40 MG IJ SOLR
40.0000 mg | Freq: Every day | INTRAMUSCULAR | Status: DC
  Administered 2023-01-12 – 2023-01-15 (×4): 40 mg via INTRAVENOUS
  Filled 2023-01-11 (×4): qty 1

## 2023-01-11 NOTE — Progress Notes (Signed)
NAME:  Cody Alexander, MRN:  [id number], DOB:  09-14-1955, LOS: 6 ADMISSION DATE:  01/05/2023, CONSULTATION DATE: 01/08/2023 REFERRING MD: Dr. Georgeann Oppenheim, CHIEF COMPLAINT: AMS    History of Present Illness:  This is a 67 yo male with a PMH of COPD and Chronic Systolic Diastolic CHF.  He presented to [facility] ER on 05/12 from home via EMS with c/o shortness of breath onset 05/12.  Per ER notes EMS reported pt wears 4L O2 chronically, however he has been noncompliant with his oxygen.  When EMS arrival at pts home he was noted to be diaphoretic with initial O2 sats @86 % on 4L O2.    ED Course  Upon arrival to the ER pt alert/oriented but remained diaphoretic/tachypneic requiring 15L NRB with resolution of hypoxia.  Pt placed on Bipap.  Pt reported he had some lower extremity swelling and inability to void.  CXR concerning for mild interstitial edema.  CT Renal stone study revealed no nephrolithiasis, ureterolithiasis, or obstructive uropathy but a small amount of gas present in the bladder presumably related to procedure and small bladder diverticula.  CTA Chest negative for PE or acute pulmonary parenchymal findings.  UA concerning for possible UTI.  Pt received cefepime.  He was subsequently admitted to the progressive care unit for additional workup and treatment.  See detailed hospital course below under significant events   Pertinent  Medical History  COPD Chronic O2 @4L   Type II Diabetes Mellitus  Essential HTN  Neuropathy  Chronic Systolic Diastolic CHF  OSA  MI  Obesity  Substance Abuse   Significant Hospital Events: Including procedures, antibiotic start and stop dates in addition to other pertinent events   05/13: Pt admitted to the progressive care unit with acute on chronic hypoxic respiratory failure secondary to acute on chronic CHF exacerbation, UTI, and NSTEMI  05/15: Pt developed acute encephalopathy and acute hypercapnic hypoxic respiratory failure overnight requiring portable  Bipap.  However, pt failed Bipap developed jerking movements concerning for myoclonus requiring transfer to ICU. PCCM team consulted and pt mechanically intubated  01/09/23- patient had abnormal EKG today but repeat cardiac biomarkers seemed to be normal. He is on MV today he is not ready for SBT.  We did deliver diamox today to help with acid base disorder.  01/10/23- for SBT today, on insulin gtt, electrolytes are improved. Reducing steroids today.  01/11/23- failed SBT today. >100k colonies of ecoli on Urine culture. BP is low today , have dcd aldactone and flomax. Continuing rocephin, unable to liberate from MV today.  Interim History / Subjective:  Pt sedated following mechanical intubation post intubation jerking movements subsided  Objective   Blood pressure (!) 92/54, pulse 77, temperature 99.5 F (37.5 C), temperature source Oral, resp. rate (!) 22, height 6\' 1"  (1.854 m), weight (!) 148 kg, SpO2 93 %.    Vent Mode: PRVC FiO2 (%):  [40 %-50 %] 50 % Set Rate:  [22 bmp] 22 bmp Vt Set:  [550 mL] 550 mL PEEP:  [8 cmH20] 8 cmH20 Pressure Support:  [10 cmH20] 10 cmH20 Plateau Pressure:  [17 cmH20-22 cmH20] 22 cmH20   Intake/Output Summary (Last 24 hours) at 01/11/2023 0905 Last data filed at 01/11/2023 0800 Gross per 24 hour  Intake 2343.48 ml  Output 1735 ml  Net 608.48 ml    Filed Weights   01/09/23 0457 01/11/23 0028 01/11/23 0400  Weight: (!) 140 kg (!) 148 kg (!) 148 kg    Examination: General: Acutely-ill appearing male, NAD mechanically intubated  HENT: Supple, no JVD  Lungs: Diminished throughout, even, non labored  Cardiovascular: NSR, s1s2, no m/r/g, 2+ radial/2+ distal pulses, 2+ bilateral lower extremity edema  Abdomen: +BS x4, obese, soft, non distended  Extremities: Bilateral upper and lower extremity intermittent jerking  Neuro: Sedated, not following commands, withdraws from painful stimulation, PERRL  GU: External cathter in place   Resolved Hospital Problem  list     Assessment & Plan:  #Acute on chronic hypoxic hypercapnic respiratory failure secondary             To SEVERE AECOPD and acute DECOMPENSATED CHF exacerbation WITH EF >55%   #Mechanical intubation  - Full vent support for now: vent settings reviewed and established  - Continue lung protective strategies  - Plateau pressures less than 30 cm H20 - SBT once all parameters met  - VAP bundle implemented  - Intermittent CXR and ABG's - Scheduled and prn bronchodilator therapy  - IV steroids wean as tolerated   #Elevated troponin secondary to NSTEMI vs. demand ischemia  #Acute on chronic diastolic systolic CHF  #Hypotension likely secondary to sedating medication  Hx: HTN  Echo 01/07/23: EF 65 to 70%; grade II diastolic dysfunction; trivial mitral valve regurgitation  - Continuous telemetry monitoring  - Trend troponin's until peaked  - Prn levophed gtt to maintain map >65  - Hold outpatient antihypertensives and beta-blockers for now  - Continue aspirin and atorvastatin  - Cardiology consulted appreciate input   #Mild acute kidney injury suspect secondary to diuretic  - Trend BMP and lactic acid  - Replace electrolytes as indicated  - Monitor UOP - Avoid nephrotoxic medication when able - Hold furosemide for now due to metabolic alkalosis; if pt requires diuresis will administer diamox   #UTI secondary to Ecoli  - Trend WBC and monitor fever curve  - Trend PCT  - Continue ceftriaxone  #Type II diabetes mellitus  - CBG's q4hrs  - SSI  - Hemoglobin A1c pending  #OSA- CHRONIC STABLE #Acute metabolic encephalopathy  #Possible seizure activity vs. possible myoclonus  #Mechanical intubation pain/discomfort  - CT Head and EEG pending  - Correct metabolic derangements  - Maintain RASS goal of 0 to -1 - PAD protocol to maintain RASS goal: Propofol and fentanyl gtts  - WUA daily  - Will obtain urine drug screen  - Avoid sedating medications when able   Best Practice  (right click and "Reselect all SmartList Selections" daily)   Diet/type: NPO DVT prophylaxis: LMWH GI prophylaxis: H2B Lines: N/A Foley:  N/A Code Status:  full code Last date of multidisciplinary goals of care discussion [N/A]  05/15: Attempted to contact pts friend Lalla Brothers utilizing phone number listed in epic, however the phone number listed in epic is the wrong number.  No other family contact information available in epic.  Labs   CBC: Recent Labs  Lab 01/05/23 1535 01/07/23 2306 01/08/23 0431 01/09/23 0507 01/10/23 0319 01/11/23 0500  WBC 22.9* 6.9 7.3 8.6 17.5* 15.5*  NEUTROABS 20.6*  --   --   --   --   --   HGB 15.1 12.7* 13.1 12.8* 14.7 12.7*  HCT 47.5 42.0 44.4 40.5 44.6 39.7  MCV 89.8 95.5 95.7 90.6 87.5 89.8  PLT 233 195 193 210 229 226     Basic Metabolic Panel: Recent Labs  Lab 01/07/23 2257 01/08/23 0431 01/08/23 1450 01/08/23 1457 01/09/23 0507 01/09/23 1611 01/09/23 2051 01/10/23 0319 01/11/23 0500  NA 136 136  --   --  137  --   --  136 138  K 3.9 4.1   < >  --  3.2* 3.3* 3.3* 3.1* 4.6  CL 89* 90*  --   --  91*  --   --  95* 104  CO2 38* 38*  --   --  34*  --   --  29 27  GLUCOSE 182* 219*  --   --  173*  --   --  251* 239*  BUN 23 26*  --   --  27*  --   --  28* 53*  CREATININE 1.02 1.27*  --   --  1.19  --   --  1.01 1.09  CALCIUM 8.5* 8.7*  --   --  8.7*  --   --  9.1 8.7*  MG  --   --   --  2.3 2.2 2.5*  --  2.7*  --   PHOS  --   --   --  2.1* 1.5* 5.1* 3.9 2.7  --    < > = values in this interval not displayed.    GFR: Estimated Creatinine Clearance: 102.4 mL/min (by C-G formula based on SCr of 1.09 mg/dL). Recent Labs  Lab 01/08/23 0431 01/08/23 1457 01/09/23 0507 01/10/23 0319 01/11/23 0500  WBC 7.3  --  8.6 17.5* 15.5*  LATICACIDVEN  --  1.7  --   --   --      Liver Function Tests: Recent Labs  Lab 01/09/23 2051  AST 36  ALT 23  ALKPHOS 52  BILITOT 1.0  PROT 7.1  ALBUMIN 3.6    No results for input(s):  "LIPASE", "AMYLASE" in the last 168 hours. Recent Labs  Lab 01/07/23 2256  AMMONIA 45*     ABG    Component Value Date/Time   PHART 7.44 01/09/2023 1517   PCO2ART 54 (H) 01/09/2023 1517   PO2ART 62 (L) 01/09/2023 1517   HCO3 36.7 (H) 01/09/2023 1517   O2SAT 92.4 01/09/2023 1517     Coagulation Profile: Recent Labs  Lab 01/05/23 2111 01/07/23 2306  INR 1.1 1.0     Cardiac Enzymes: Recent Labs  Lab 01/08/23 1450  CKTOTAL 917*     HbA1C: Hemoglobin A1C  Date/Time Value Ref Range Status  08/12/2013 04:01 AM 6.9 (H) 4.2 - 6.3 % Final    Comment:    The American Diabetes Association recommends that a primary goal of therapy should be <7% and that physicians should reevaluate the treatment regimen in patients with HbA1c values consistently >8%.    Hgb A1c MFr Bld  Date/Time Value Ref Range Status  01/08/2023 02:57 PM 9.4 (H) 4.8 - 5.6 % Final    Comment:    (NOTE) Pre diabetes:          5.7%-6.4%  Diabetes:              >6.4%  Glycemic control for   <7.0% adults with diabetes   11/10/2022 12:36 AM 11.2 (H) 4.8 - 5.6 % Final    Comment:    (NOTE)         Prediabetes: 5.7 - 6.4         Diabetes: >6.4         Glycemic control for adults with diabetes: <7.0     CBG: Recent Labs  Lab 01/10/23 1613 01/10/23 1914 01/10/23 2307 01/11/23 0316 01/11/23 0747  GLUCAP 239* 323* 297* 225* 189*     Review of Systems:  Unable to assess pt mechanically intubated   Past Medical History:  He,  has a past medical history of CHF (congestive heart failure) (HCC), COPD (chronic obstructive pulmonary disease) (HCC), Diabetes mellitus without complication (HCC), Hypertension, and Neuropathy.   Surgical History:   Past Surgical History:  Procedure Laterality Date   LEFT HEART CATH AND CORONARY ANGIOGRAPHY Right 02/27/2018   Procedure: LEFT HEART CATH AND CORONARY ANGIOGRAPHY;  Surgeon: Laurier Nancy, MD;  Location: [facility] INVASIVE CV LAB;  Service:  Cardiovascular;  Laterality: Right;     Social History:   reports that he has quit smoking. His smoking use included cigarettes. He has never used smokeless tobacco. He reports that he does not drink alcohol and does not use drugs.   Family History:  His family history includes Hypertension in his father; Stroke in his mother.   Allergies Allergies  Allergen Reactions   Erythromycin Anaphylaxis and Swelling    Had eye swelling with erythromycin during a time when he had perf ear drum.  Tolerated azithromycin.     Home Medications  Prior to Admission medications   Medication Sig Start Date End Date Taking? Authorizing Provider  albuterol (PROVENTIL) (2.5 MG/3ML) 0.083% nebulizer solution Take 2.5 mg by nebulization every 4 (four) hours as needed. 08/01/22 08/01/23 Yes [provider]  ASPIRIN EC 81 MG EC tablet Take 81 mg by mouth daily. 11/15/22  Yes [provider]  atorvastatin (LIPITOR) 40 MG tablet Take 40 mg by mouth daily.   Yes [provider]  budesonide-formoterol (SYMBICORT) 160-4.5 MCG/ACT inhaler Inhale 2 puffs into the lungs 2 (two) times daily.   Yes [provider]  bumetanide (BUMEX) 1 MG tablet Take 3 mg by mouth daily. 11/21/22  Yes [provider]  diclofenac Sodium (VOLTAREN) 1 % GEL Apply 2 g topically in the morning and at bedtime. 08/01/22  Yes [provider]  docusate sodium (COLACE) 100 MG capsule Take 1 capsule (100 mg total) by mouth 2 (two) times daily. 11/15/22  Yes Baron Hamper, MD  hydrOXYzine (ATARAX) 10 MG tablet Take 1 tablet (10 mg total) by mouth 3 (three) times daily as needed. Home med. 06/25/22  Yes [name], MD  insulin lispro (HUMALOG) 100 UNIT/ML KwikPen Inject 10 Units into the skin 3 (three) times daily with meals.   Yes [provider]  ipratropium-albuterol (DUONEB) 0.5-2.5 (3) MG/3ML SOLN Take 3 mLs by nebulization every 6 (six) hours as needed. 06/25/22  Yes [name], MD   isosorbide dinitrate (ISORDIL) 30 MG tablet Take 30 mg by mouth every morning. 11/14/20  Yes [provider]  LANTUS SOLOSTAR 100 UNIT/ML Solostar Pen Inject 40 Units into the skin 2 (two) times daily. 01/01/23  Yes [provider]  lisinopril (ZESTRIL) 20 MG tablet Take 20 mg by mouth daily.   Yes [provider]  magnesium oxide (MAG-OX) 400 MG tablet Take 1 tablet by mouth 2 (two) times daily. 10/24/22  Yes [provider]  metFORMIN (GLUCOPHAGE) 1000 MG tablet Take 1,000 mg by mouth daily. 06/02/20  Yes [provider]  metoprolol tartrate (LOPRESSOR) 25 MG tablet Take 25 mg by mouth daily.   Yes [provider]  PROAIR HFA 108 (90 Base) MCG/ACT inhaler Inhale 2 puffs into the lungs every 4 (four) hours as needed for wheezing. 08/18/21  Yes Wieting, Richard, MD  triamcinolone cream (KENALOG) 0.1 % Apply 1 Application topically 2 (two) times daily. 10/25/22  Yes [provider]  furosemide (LASIX) 40  MG tablet Take 1 tablet (40 mg total) by mouth 2 (two) times daily. 11/15/22 12/15/22  Baron Hamper, MD  gabapentin (NEURONTIN) 600 MG tablet Take 600 mg by mouth 3 (three) times daily. Patient not taking: Reported on 01/05/2023 04/18/22   [provider]  insulin detemir (LEVEMIR) 100 UNIT/ML FlexPen Inject 30 Units into the skin in the morning and at bedtime. Patient not taking: Reported on 01/05/2023 06/25/22   [name], MD  KLOR-CON M10 10 MEQ tablet Take 10 mEq by mouth daily. Patient not taking: Reported on 01/05/2023 10/24/22   [provider]  Selenium Sulfide 2.25 % SHAM SMARTSIG:Liberally Topical Daily Patient not taking: Reported on 01/05/2023 10/28/22   [provider]     Critical care provider statement:   Total critical care time: 33 minutes   Performed by: Karna Christmas MD   Critical care time was exclusive of separately billable procedures and treating other patients.   Critical care was necessary to  treat or prevent imminent or life-threatening deterioration.   Critical care was time spent personally by me on the following activities: development of treatment plan with patient and/or surrogate as well as nursing, discussions with consultants, evaluation of patient's response to treatment, examination of patient, obtaining history from patient or surrogate, ordering and performing treatments and interventions, ordering and review of laboratory studies, ordering and review of radiographic studies, pulse oximetry and re-evaluation of patient's condition.    Vida Rigger, M.D.  Pulmonary & Critical Care Medicine

## 2023-01-11 NOTE — Progress Notes (Signed)
[facility] Cardiology    SUBJECTIVE: Intubated sedated   Vitals:   01/11/23 0600 01/11/23 0605 01/11/23 0709 01/11/23 0800  BP: (!) 92/54     Pulse: 80 77    Resp: (!) 22 (!) 22    Temp:    99.5 F (37.5 C)  TempSrc:    Oral  SpO2: 94% 93% 93%   Weight:      Height:         Intake/Output Summary (Last 24 hours) at 01/11/2023 1004 Last data filed at 01/11/2023 0800 Gross per 24 hour  Intake 2131.84 ml  Output 1735 ml  Net 396.84 ml      PHYSICAL EXAM  General: Well developed, well nourished, in no acute distress HEENT:  Normocephalic and atramatic Neck:  No JVD.  Lungs: Clear bilaterally to auscultation and percussion. Heart: HRRR . Normal S1 and S2 without gallops or murmurs.  Abdomen: Bowel sounds are positive, abdomen soft and non-tender  Msk:  Back normal, normal gait. Normal strength and tone for age. Extremities: No clubbing, cyanosis or edema.   Neuro: Alert and oriented X 3. Psych:  Good affect, responds appropriately   LABS: Basic Metabolic Panel: Recent Labs    01/09/23 1611 01/09/23 2051 01/10/23 0319 01/11/23 0500  NA  --   --  136 138  K 3.3* 3.3* 3.1* 4.6  CL  --   --  95* 104  CO2  --   --  29 27  GLUCOSE  --   --  251* 239*  BUN  --   --  28* 53*  CREATININE  --   --  1.01 1.09  CALCIUM  --   --  9.1 8.7*  MG 2.5*  --  2.7*  --   PHOS 5.1* 3.9 2.7  --    Liver Function Tests: Recent Labs    01/09/23 2051  AST 36  ALT 23  ALKPHOS 52  BILITOT 1.0  PROT 7.1  ALBUMIN 3.6   No results for input(s): "LIPASE", "AMYLASE" in the last 72 hours. CBC: Recent Labs    01/10/23 0319 01/11/23 0500  WBC 17.5* 15.5*  HGB 14.7 12.7*  HCT 44.6 39.7  MCV 87.5 89.8  PLT 229 226   Cardiac Enzymes: Recent Labs    01/08/23 1450  CKTOTAL 917*   BNP: Invalid input(s): "POCBNP" D-Dimer: No results for input(s): "DDIMER" in the last 72 hours. Hemoglobin A1C: Recent Labs    01/08/23 1457  HGBA1C 9.4*   Fasting Lipid Panel: Recent Labs     01/11/23 0500  TRIG 120   Thyroid Function Tests: No results for input(s): "TSH", "T4TOTAL", "T3FREE", "THYROIDAB" in the last 72 hours.  Invalid input(s): "FREET3" Anemia Panel: No results for input(s): "VITAMINB12", "FOLATE", "FERRITIN", "TIBC", "IRON", "RETICCTPCT" in the last 72 hours.  DG Chest Port 1 View  Result Date: 01/10/2023 CLINICAL DATA:  Intubation EXAM: PORTABLE CHEST 1 VIEW COMPARISON:  01/10/2023 at 0539 hours FINDINGS: Enteric tube terminates approximately 3.4 cm above the carina. Enteric tube courses below the diaphragm with distal tip beyond the inferior margin of the film. Stable cardiomegaly. Low lung volumes. Small left pleural effusion appears smaller than the previous study although differences may be secondary to more upright positioning on the current exam. Streaky left greater than right bibasilar opacities. Mild bilateral interstitial prominence slightly improving. No evidence of a pneumothorax. Numerous overlying monitoring leads. IMPRESSION: 1. Enteric tube terminates approximately 3.4 cm above the carina. 2. Small left pleural effusion appears smaller  than the previous study although differences may be secondary to more upright positioning on the current exam. 3. Mild edema, slightly improving. Electronically Signed   By: Duanne Guess D.O.   On: 01/10/2023 19:58   DG Chest Port 1 View  Result Date: 01/10/2023 CLINICAL DATA:  Acute on chronic hypoxic respiratory failure. EXAM: PORTABLE CHEST 1 VIEW COMPARISON:  AP chest 01/08/2023 (multiple studies), 01/05/2023; chest two views 11/09/2022 FINDINGS: Endotracheal tube tip terminates approximately 4.2 cm above the carina, in between the clavicular heads and the carina. Enteric tube descends below the diaphragm with the tip excluded by collimation, as on prior. Cardiac silhouette is again mildly enlarged. Mediastinal contours are grossly within normal limits for AP technique. Mild calcification within the aortic arch.  Apparent mild increase in now mild-to-moderate left pleural effusion compared to most recent 01/08/2023 study at 1237 hours. There is mild bilateral interstitial thickening. Interval increase in fairly homogeneous density overlying the entire right lung may be artifactual or be related to vascular congestion. No pneumothorax. No acute skeletal abnormality. IMPRESSION: 1. Apparent mild increase in now mild-to-moderate left pleural effusion compared to most recent 01/08/2023 study at 1237 hours. 2. Cardiomegaly with minimal interstitial pulmonary edema. Interval increase in fairly homogeneous density overlying the entire right lung may be artifactual or be related to vascular congestion. Electronically Signed   By: Neita Garnet M.D.   On: 01/10/2023 08:51     Echo   TELEMETRY: Normal sinus rhythm nonspecific ST-T wave changes:  ASSESSMENT AND PLAN:  Principal Problem:   NSTEMI (non-ST elevated myocardial infarction) (HCC) Active Problems:   Chronic obstructive pulmonary disease (COPD) (HCC)   Type 2 diabetes mellitus with hyperglycemia, with long-term current use of insulin (HCC)   Essential hypertension   Obesity, Class III, BMI 40-49.9 (morbid obesity) (HCC)   Acute on chronic respiratory failure with hypoxia (HCC)   Acute on chronic diastolic CHF (congestive heart failure) (HCC)   OSA (obstructive sleep apnea)   Benign prostatic hyperplasia with urinary hesitancy   Urinary tract infection   Hypokalemia   History of kidney stones    Plan Respiratory failure continue to discharge advised to wean from vent when able Acute on chronic respiratory failure continue vent continue inhalers continue critical care management Obstructive sleep apnea, sleep study CPAP weight loss Urinary tract infection chronic stable Hypokalemia continue to correct electrolytes COPD chronic stable continue inhalers continue aggressive respiratory management Non-STEMI stable consider further ischemia workup when  patient stable and off of   Alwyn Pea, MD 01/11/2023 10:04 AM

## 2023-01-12 LAB — CBC
HCT: 40.2 % (ref 39.0–52.0)
Hemoglobin: 12.6 g/dL — ABNORMAL LOW (ref 13.0–17.0)
MCH: 28.7 pg (ref 26.0–34.0)
MCHC: 31.3 g/dL (ref 30.0–36.0)
MCV: 91.6 fL (ref 80.0–100.0)
Platelets: 251 10*3/uL (ref 150–400)
RBC: 4.39 MIL/uL (ref 4.22–5.81)
RDW: 14.4 % (ref 11.5–15.5)
WBC: 12.2 10*3/uL — ABNORMAL HIGH (ref 4.0–10.5)
nRBC: 0 % (ref 0.0–0.2)

## 2023-01-12 LAB — GLUCOSE, CAPILLARY
Glucose-Capillary: 188 mg/dL — ABNORMAL HIGH (ref 70–99)
Glucose-Capillary: 191 mg/dL — ABNORMAL HIGH (ref 70–99)
Glucose-Capillary: 177 mg/dL — ABNORMAL HIGH (ref 70–99)
Glucose-Capillary: 154 mg/dL — ABNORMAL HIGH (ref 70–99)
Glucose-Capillary: 172 mg/dL — ABNORMAL HIGH (ref 70–99)
Glucose-Capillary: 200 mg/dL — ABNORMAL HIGH (ref 70–99)

## 2023-01-12 LAB — BASIC METABOLIC PANEL
Anion gap: 5 (ref 5–15)
BUN: 59 mg/dL — ABNORMAL HIGH (ref 8–23)
CO2: 30 mmol/L (ref 22–32)
Calcium: 8.8 mg/dL — ABNORMAL LOW (ref 8.9–10.3)
Chloride: 106 mmol/L (ref 98–111)
Creatinine, Ser: 1.06 mg/dL (ref 0.61–1.24)
GFR, Estimated: >60 mL/min (ref >60)
Glucose, Bld: 221 mg/dL — ABNORMAL HIGH (ref 70–99)
Potassium: 4.8 mmol/L (ref 3.5–5.1)
Sodium: 141 mmol/L (ref 135–145)

## 2023-01-12 NOTE — Progress Notes (Signed)
NAME:  Cody Alexander, MRN:  [id number], DOB:  [birth year], LOS: 7 ADMISSION DATE:  01/05/2023, CONSULTATION DATE: 01/08/2023 REFERRING MD: Dr. Georgeann Oppenheim, CHIEF COMPLAINT: AMS    History of Present Illness:  This is a 67 yo male with a PMH of COPD and Chronic Systolic Diastolic CHF.  He presented to [facility] ER on 05/12 from home via EMS with c/o shortness of breath onset 05/12.  Per ER notes EMS reported pt wears 4L O2 chronically, however he has been noncompliant with his oxygen.  When EMS arrival at pts home he was noted to be diaphoretic with initial O2 sats @86 % on 4L O2.    ED Course  Upon arrival to the ER pt alert/oriented but remained diaphoretic/tachypneic requiring 15L NRB with resolution of hypoxia.  Pt placed on Bipap.  Pt reported he had some lower extremity swelling and inability to void.  CXR concerning for mild interstitial edema.  CT Renal stone study revealed no nephrolithiasis, ureterolithiasis, or obstructive uropathy but a small amount of gas present in the bladder presumably related to procedure and small bladder diverticula.  CTA Chest negative for PE or acute pulmonary parenchymal findings.  UA concerning for possible UTI.  Pt received cefepime.  He was subsequently admitted to the progressive care unit for additional workup and treatment.  See detailed hospital course below under significant events   Pertinent  Medical History  COPD Chronic O2 @4L   Type II Diabetes Mellitus  Essential HTN  Neuropathy  Chronic Systolic Diastolic CHF  OSA  MI  Obesity  Substance Abuse   Significant Hospital Events: Including procedures, antibiotic start and stop dates in addition to other pertinent events   05/13: Pt admitted to the progressive care unit with acute on chronic hypoxic respiratory failure secondary to acute on chronic CHF exacerbation, UTI, and NSTEMI  05/15: Pt developed acute encephalopathy and acute hypercapnic hypoxic respiratory failure overnight requiring portable  Bipap.  However, pt failed Bipap developed jerking movements concerning for myoclonus requiring transfer to ICU. PCCM team consulted and pt mechanically intubated  01/09/23- patient had abnormal EKG today but repeat cardiac biomarkers seemed to be normal. He is on MV today he is not ready for SBT.  We did deliver diamox today to help with acid base disorder.  01/10/23- for SBT today, on insulin gtt, electrolytes are improved. Reducing steroids today.  01/11/23- failed SBT today. >100k colonies of ecoli on Urine culture. BP is low today , have dcd aldactone and flomax. Continuing rocephin, unable to liberate from MV today. 01/12/23- PRVC weaned to 50%, secretions improved.   Interim History / Subjective:  Pt sedated following mechanical intubation post intubation jerking movements subsided  Objective   Blood pressure (!) 93/50, pulse 100, temperature 98.3 F (36.8 C), resp. rate (!) 22, height 6\' 1"  (1.854 m), weight (!) 140 kg, SpO2 (!) 87 %.    Vent Mode: PRVC FiO2 (%):  [50 %] 50 % Set Rate:  [22 bmp] 22 bmp Vt Set:  [550 mL] 550 mL PEEP:  [5 cmH20-8 cmH20] 5 cmH20 Plateau Pressure:  [20 cmH20] 20 cmH20   Intake/Output Summary (Last 24 hours) at 01/12/2023 1029 Last data filed at 01/12/2023 0600 Gross per 24 hour  Intake 1798.22 ml  Output 2375 ml  Net -576.78 ml    Filed Weights   01/11/23 0028 01/11/23 0400 01/12/23 0348  Weight: (!) 148 kg (!) 148 kg (!) 140 kg    Examination: General: Acutely-ill appearing male, NAD mechanically intubated  HENT:  Supple, no JVD  Lungs: Diminished throughout, even, non labored  Cardiovascular: NSR, s1s2, no m/r/g, 2+ radial/2+ distal pulses, 2+ bilateral lower extremity edema  Abdomen: +BS x4, obese, soft, non distended  Extremities: Bilateral upper and lower extremity intermittent jerking  Neuro: Sedated, not following commands, withdraws from painful stimulation, PERRL  GU: External cathter in place   Resolved Hospital Problem list      Assessment & Plan:  #Acute on chronic hypoxic hypercapnic respiratory failure secondary             To SEVERE AECOPD and acute DECOMPENSATED CHF exacerbation WITH EF >55%   #Mechanical intubation  - Full vent support for now: vent settings reviewed and established  - Continue lung protective strategies  - Plateau pressures less than 30 cm H20 - SBT once all parameters met  - VAP bundle implemented  - Intermittent CXR and ABG's - Scheduled and prn bronchodilator therapy  - IV steroids wean as tolerated   #Elevated troponin secondary to NSTEMI vs. demand ischemia  #Acute on chronic diastolic systolic CHF  #Hypotension likely secondary to sedating medication  Hx: HTN  Echo 01/07/23: EF 65 to 70%; grade II diastolic dysfunction; trivial mitral valve regurgitation  - Continuous telemetry monitoring  - Trend troponin's until peaked  - Prn levophed gtt to maintain map >65  - Hold outpatient antihypertensives and beta-blockers for now  - Continue aspirin and atorvastatin  - Cardiology consulted appreciate input   #Mild acute kidney injury suspect secondary to diuretic  - Trend BMP and lactic acid  - Replace electrolytes as indicated  - Monitor UOP - Avoid nephrotoxic medication when able - Hold furosemide for now due to metabolic alkalosis; if pt requires diuresis will administer diamox   #UTI secondary to Ecoli  - Trend WBC and monitor fever curve  - Trend PCT  - Continue ceftriaxone  #Type II diabetes mellitus  - CBG's q4hrs  - SSI  - Hemoglobin A1c pending  #OSA- CHRONIC STABLE #Acute metabolic encephalopathy  #Possible seizure activity vs. possible myoclonus  #Mechanical intubation pain/discomfort  - CT Head and EEG pending  - Correct metabolic derangements  - Maintain RASS goal of 0 to -1 - PAD protocol to maintain RASS goal: Propofol and fentanyl gtts  - WUA daily  - Will obtain urine drug screen  - Avoid sedating medications when able   Best Practice (right  click and "Reselect all SmartList Selections" daily)   Diet/type: NPO DVT prophylaxis: LMWH GI prophylaxis: H2B Lines: N/A Foley:  N/A Code Status:  full code Last date of multidisciplinary goals of care discussion [N/A]  05/15: Attempted to contact pts friend Lalla Brothers utilizing phone number listed in epic, however the phone number listed in epic is the wrong number.  No other family contact information available in epic.  Labs   CBC: Recent Labs  Lab 01/05/23 1535 01/07/23 2306 01/08/23 0431 01/09/23 0507 01/10/23 0319 01/11/23 0500 01/12/23 0448  WBC 22.9*   < > 7.3 8.6 17.5* 15.5* 12.2*  NEUTROABS 20.6*  --   --   --   --   --   --   HGB 15.1   < > 13.1 12.8* 14.7 12.7* 12.6*  HCT 47.5   < > 44.4 40.5 44.6 39.7 40.2  MCV 89.8   < > 95.7 90.6 87.5 89.8 91.6  PLT 233   < > 193 210 229 226 251   < > = values in this interval not displayed.  Basic Metabolic Panel: Recent Labs  Lab 01/08/23 0431 01/08/23 1450 01/08/23 1457 01/09/23 0507 01/09/23 1611 01/09/23 2051 01/10/23 0319 01/11/23 0500 01/12/23 0448  NA 136  --   --  137  --   --  136 138 141  K 4.1   < >  --  3.2* 3.3* 3.3* 3.1* 4.6 4.8  CL 90*  --   --  91*  --   --  95* 104 106  CO2 38*  --   --  34*  --   --  29 27 30   GLUCOSE 219*  --   --  173*  --   --  251* 239* 221*  BUN 26*  --   --  27*  --   --  28* 53* 59*  CREATININE 1.27*  --   --  1.19  --   --  1.01 1.09 1.06  CALCIUM 8.7*  --   --  8.7*  --   --  9.1 8.7* 8.8*  MG  --   --  2.3 2.2 2.5*  --  2.7*  --   --   PHOS  --   --  2.1* 1.5* 5.1* 3.9 2.7  --   --    < > = values in this interval not displayed.    GFR: Estimated Creatinine Clearance: 100.7 mL/min (by C-G formula based on SCr of 1.06 mg/dL). Recent Labs  Lab 01/08/23 1457 01/09/23 0507 01/10/23 0319 01/11/23 0500 01/12/23 0448  WBC  --  8.6 17.5* 15.5* 12.2*  LATICACIDVEN 1.7  --   --   --   --      Liver Function Tests: Recent Labs  Lab 01/09/23 2051  AST  36  ALT 23  ALKPHOS 52  BILITOT 1.0  PROT 7.1  ALBUMIN 3.6    No results for input(s): "LIPASE", "AMYLASE" in the last 168 hours. Recent Labs  Lab 01/07/23 2256  AMMONIA 45*     ABG    Component Value Date/Time   PHART 7.44 01/09/2023 1517   PCO2ART 54 (H) 01/09/2023 1517   PO2ART 62 (L) 01/09/2023 1517   HCO3 36.7 (H) 01/09/2023 1517   O2SAT 92.4 01/09/2023 1517     Coagulation Profile: Recent Labs  Lab 01/05/23 2111 01/07/23 2306  INR 1.1 1.0     Cardiac Enzymes: Recent Labs  Lab 01/08/23 1450  CKTOTAL 917*     HbA1C: Hemoglobin A1C  Date/Time Value Ref Range Status  08/12/2013 04:01 AM 6.9 (H) 4.2 - 6.3 % Final    Comment:    The American Diabetes Association recommends that a primary goal of therapy should be <7% and that physicians should reevaluate the treatment regimen in patients with HbA1c values consistently >8%.    Hgb A1c MFr Bld  Date/Time Value Ref Range Status  01/08/2023 02:57 PM 9.4 (H) 4.8 - 5.6 % Final    Comment:    (NOTE) Pre diabetes:          5.7%-6.4%  Diabetes:              >6.4%  Glycemic control for   <7.0% adults with diabetes   11/10/2022 12:36 AM 11.2 (H) 4.8 - 5.6 % Final    Comment:    (NOTE)         Prediabetes: 5.7 - 6.4         Diabetes: >6.4         Glycemic control for adults with diabetes: <7.0  CBG: Recent Labs  Lab 01/11/23 1540 01/11/23 1924 01/11/23 2330 01/12/23 0314 01/12/23 0803  GLUCAP 311* 294* 238* 188* 191*     Review of Systems:   Unable to assess pt mechanically intubated   Past Medical History:  He,  has a past medical history of CHF (congestive heart failure) (HCC), COPD (chronic obstructive pulmonary disease) (HCC), Diabetes mellitus without complication (HCC), Hypertension, and Neuropathy.   Surgical History:   Past Surgical History:  Procedure Laterality Date   LEFT HEART CATH AND CORONARY ANGIOGRAPHY Right 02/27/2018   Procedure: LEFT HEART CATH AND CORONARY  ANGIOGRAPHY;  Surgeon: Laurier Nancy, MD;  Location: [facility] INVASIVE CV LAB;  Service: Cardiovascular;  Laterality: Right;     Social History:   reports that he has quit smoking. His smoking use included cigarettes. He has never used smokeless tobacco. He reports that he does not drink alcohol and does not use drugs.   Family History:  His family history includes Hypertension in his father; Stroke in his mother.   Allergies Allergies  Allergen Reactions   Erythromycin Anaphylaxis and Swelling    Had eye swelling with erythromycin during a time when he had perf ear drum.  Tolerated azithromycin.     Home Medications  Prior to Admission medications   Medication Sig Start Date End Date Taking? Authorizing Provider  albuterol (PROVENTIL) (2.5 MG/3ML) 0.083% nebulizer solution Take 2.5 mg by nebulization every 4 (four) hours as needed. 08/01/22 08/01/23 Yes [provider]  ASPIRIN EC 81 MG EC tablet Take 81 mg by mouth daily. 11/15/22  Yes [provider]  atorvastatin (LIPITOR) 40 MG tablet Take 40 mg by mouth daily.   Yes [provider]  budesonide-formoterol (SYMBICORT) 160-4.5 MCG/ACT inhaler Inhale 2 puffs into the lungs 2 (two) times daily.   Yes [provider]  bumetanide (BUMEX) 1 MG tablet Take 3 mg by mouth daily. 11/21/22  Yes [provider]  diclofenac Sodium (VOLTAREN) 1 % GEL Apply 2 g topically in the morning and at bedtime. 08/01/22  Yes [provider]  docusate sodium (COLACE) 100 MG capsule Take 1 capsule (100 mg total) by mouth 2 (two) times daily. 11/15/22  Yes Baron Hamper, MD  hydrOXYzine (ATARAX) 10 MG tablet Take 1 tablet (10 mg total) by mouth 3 (three) times daily as needed. Home med. 06/25/22  Yes [name], MD  insulin lispro (HUMALOG) 100 UNIT/ML KwikPen Inject 10 Units into the skin 3 (three) times daily with meals.   Yes [provider]  ipratropium-albuterol (DUONEB) 0.5-2.5 (3) MG/3ML SOLN Take 3  mLs by nebulization every 6 (six) hours as needed. 06/25/22  Yes [name], MD  isosorbide dinitrate (ISORDIL) 30 MG tablet Take 30 mg by mouth every morning. 11/14/20  Yes [provider]  LANTUS SOLOSTAR 100 UNIT/ML Solostar Pen Inject 40 Units into the skin 2 (two) times daily. 01/01/23  Yes [provider]  lisinopril (ZESTRIL) 20 MG tablet Take 20 mg by mouth daily.   Yes [provider]  magnesium oxide (MAG-OX) 400 MG tablet Take 1 tablet by mouth 2 (two) times daily. 10/24/22  Yes [provider]  metFORMIN (GLUCOPHAGE) 1000 MG tablet Take 1,000 mg by mouth daily. 06/02/20  Yes [provider]  metoprolol tartrate (LOPRESSOR) 25 MG tablet Take 25 mg by mouth daily.   Yes [provider]  PROAIR HFA 108 (90 Base) MCG/ACT inhaler Inhale 2 puffs into the lungs every 4 (four) hours as needed for  wheezing. 08/18/21  Yes Wieting, Richard, MD  triamcinolone cream (KENALOG) 0.1 % Apply 1 Application topically 2 (two) times daily. 10/25/22  Yes [provider]  furosemide (LASIX) 40 MG tablet Take 1 tablet (40 mg total) by mouth 2 (two) times daily. 11/15/22 12/15/22  Baron Hamper, MD  gabapentin (NEURONTIN) 600 MG tablet Take 600 mg by mouth 3 (three) times daily. Patient not taking: Reported on 01/05/2023 04/18/22   [provider]  insulin detemir (LEVEMIR) 100 UNIT/ML FlexPen Inject 30 Units into the skin in the morning and at bedtime. Patient not taking: Reported on 01/05/2023 06/25/22   [name], MD  KLOR-CON M10 10 MEQ tablet Take 10 mEq by mouth daily. Patient not taking: Reported on 01/05/2023 10/24/22   [provider]  Selenium Sulfide 2.25 % SHAM SMARTSIG:Liberally Topical Daily Patient not taking: Reported on 01/05/2023 10/28/22   [provider]     Critical care provider statement:   Total critical care time: 33 minutes   Performed by: Karna Christmas MD   Critical care time was exclusive of separately  billable procedures and treating other patients.   Critical care was necessary to treat or prevent imminent or life-threatening deterioration.   Critical care was time spent personally by me on the following activities: development of treatment plan with patient and/or surrogate as well as nursing, discussions with consultants, evaluation of patient's response to treatment, examination of patient, obtaining history from patient or surrogate, ordering and performing treatments and interventions, ordering and review of laboratory studies, ordering and review of radiographic studies, pulse oximetry and re-evaluation of patient's condition.    Vida Rigger, M.D.  Pulmonary & Critical Care Medicine

## 2023-01-12 NOTE — Progress Notes (Signed)
[facility] Cardiology    SUBJECTIVE: Intubated sedated   Vitals:   01/12/23 0400 01/12/23 0500 01/12/23 0600 01/12/23 0735  BP: (!) 90/51 (!) 96/54 (!) 97/54   Pulse: 71 74 74   Resp: (!) 22 (!) 22 (!) 22   Temp: 98.3 F (36.8 C)     TempSrc:      SpO2: 93% 92% 90% 92%  Weight:      Height:         Intake/Output Summary (Last 24 hours) at 01/12/2023 1022 Last data filed at 01/12/2023 0600 Gross per 24 hour  Intake 1798.22 ml  Output 2375 ml  Net -576.78 ml      PHYSICAL EXAM  General: Well developed, well nourished, in no acute distress patient on the vent HEENT:  Normocephalic and atramatic Neck:  No JVD.  Lungs: Clear bilaterally to auscultation and percussion. Heart: HRRR . Normal S1 and S2 without gallops or murmurs.  Abdomen: Bowel sounds are positive, abdomen soft and non-tender  Msk:  Back normal, normal gait. Normal strength and tone for age. Extremities: No clubbing, cyanosis or edema.   Neuro: Unresponsive  Psych: Unresponsive on the vent   LABS: Basic Metabolic Panel: Recent Labs    01/09/23 1611 01/09/23 2051 01/09/23 2051 01/10/23 0319 01/11/23 0500 01/12/23 0448  NA  --   --    < > 136 138 141  K 3.3* 3.3*  --  3.1* 4.6 4.8  CL  --   --    < > 95* 104 106  CO2  --   --    < > 29 27 30   GLUCOSE  --   --    < > 251* 239* 221*  BUN  --   --    < > 28* 53* 59*  CREATININE  --   --    < > 1.01 1.09 1.06  CALCIUM  --   --    < > 9.1 8.7* 8.8*  MG 2.5*  --   --  2.7*  --   --   PHOS 5.1* 3.9  --  2.7  --   --    < > = values in this interval not displayed.   Liver Function Tests: Recent Labs    01/09/23 2051  AST 36  ALT 23  ALKPHOS 52  BILITOT 1.0  PROT 7.1  ALBUMIN 3.6   No results for input(s): "LIPASE", "AMYLASE" in the last 72 hours. CBC: Recent Labs    01/11/23 0500 01/12/23 0448  WBC 15.5* 12.2*  HGB 12.7* 12.6*  HCT 39.7 40.2  MCV 89.8 91.6  PLT 226 251   Cardiac Enzymes: No results for input(s): "CKTOTAL", "CKMB",  "CKMBINDEX", "TROPONINI" in the last 72 hours. BNP: Invalid input(s): "POCBNP" D-Dimer: No results for input(s): "DDIMER" in the last 72 hours. Hemoglobin A1C: No results for input(s): "HGBA1C" in the last 72 hours. Fasting Lipid Panel: Recent Labs    01/11/23 0500  TRIG 120   Thyroid Function Tests: No results for input(s): "TSH", "T4TOTAL", "T3FREE", "THYROIDAB" in the last 72 hours.  Invalid input(s): "FREET3" Anemia Panel: No results for input(s): "VITAMINB12", "FOLATE", "FERRITIN", "TIBC", "IRON", "RETICCTPCT" in the last 72 hours.  DG Chest Port 1 View  Result Date: 01/10/2023 CLINICAL DATA:  Intubation EXAM: PORTABLE CHEST 1 VIEW COMPARISON:  01/10/2023 at 0539 hours FINDINGS: Enteric tube terminates approximately 3.4 cm above the carina. Enteric tube courses below the diaphragm with distal tip beyond the inferior margin of the film.  Stable cardiomegaly. Low lung volumes. Small left pleural effusion appears smaller than the previous study although differences may be secondary to more upright positioning on the current exam. Streaky left greater than right bibasilar opacities. Mild bilateral interstitial prominence slightly improving. No evidence of a pneumothorax. Numerous overlying monitoring leads. IMPRESSION: 1. Enteric tube terminates approximately 3.4 cm above the carina. 2. Small left pleural effusion appears smaller than the previous study although differences may be secondary to more upright positioning on the current exam. 3. Mild edema, slightly improving. Electronically Signed   By: Duanne Guess D.O.   On: 01/10/2023 19:58     Echo showed left ventricular function EF around 65-70% diastolic dysfunction with LVH  TELEMETRY: Sinus rhythm 90 nonspecific degenerative changes:  ASSESSMENT AND PLAN:  Principal Problem:   NSTEMI (non-ST elevated myocardial infarction) (HCC) Active Problems:   Chronic obstructive pulmonary disease (COPD) (HCC)   Type 2 diabetes  mellitus with hyperglycemia, with long-term current use of insulin (HCC)   Essential hypertension   Obesity, Class III, BMI 40-49.9 (morbid obesity) (HCC)   Acute on chronic respiratory failure with hypoxia (HCC)   Acute on chronic diastolic CHF (congestive heart failure) (HCC)   OSA (obstructive sleep apnea)   Benign prostatic hyperplasia with urinary hesitancy   Urinary tract infection   Hypokalemia   History of kidney stones    Plan Acute respiratory failure continue respiratory support wean from vent when able Non-STEMI elevated troponins will recommend ischemia workup once patient off the vent and stabilized Chronic systolic diastolic dysfunction consider further cardiac workup possibly cardiac cath with the patient stabilized Shortness of breath dyspnea COPD respiratory failure congestive heart failure continue supportive care supplemental oxygen and inhalers follow-up further workup from a cardiac standpoint patient soft Obesity recommend significant weight loss exercise portion control Probable obstructive sleep apnea sleep study CPAP weight loss Diabetes type 2 continue diabetes medication management Hypertension reasonably controlled continue current management Transfer care back to Dr. Welton Flakes in the morning   Alwyn Pea, MD 01/12/2023 10:22 AM

## 2023-01-13 ENCOUNTER — Encounter: Payer: 59 | Admitting: Family

## 2023-01-13 ENCOUNTER — Inpatient Hospital Stay: Payer: 59

## 2023-01-13 DIAGNOSIS — R0603 Acute respiratory distress: Secondary | ICD-10-CM

## 2023-01-13 DIAGNOSIS — I214 Non-ST elevation (NSTEMI) myocardial infarction: Secondary | ICD-10-CM | POA: Diagnosis not present

## 2023-01-13 DIAGNOSIS — R079 Chest pain, unspecified: Secondary | ICD-10-CM | POA: Diagnosis not present

## 2023-01-13 LAB — GLUCOSE, CAPILLARY
Glucose-Capillary: 108 mg/dL — ABNORMAL HIGH (ref 70–99)
Glucose-Capillary: 150 mg/dL — ABNORMAL HIGH (ref 70–99)
Glucose-Capillary: 197 mg/dL — ABNORMAL HIGH (ref 70–99)
Glucose-Capillary: 193 mg/dL — ABNORMAL HIGH (ref 70–99)
Glucose-Capillary: 238 mg/dL — ABNORMAL HIGH (ref 70–99)
Glucose-Capillary: 318 mg/dL — ABNORMAL HIGH (ref 70–99)
Glucose-Capillary: 296 mg/dL — ABNORMAL HIGH (ref 70–99)

## 2023-01-13 MED ORDER — INSULIN ASPART 100 UNIT/ML IJ SOLN
0.0000 [IU] | INTRAMUSCULAR | Status: DC
  Administered 2023-01-13: 11 [IU] via SUBCUTANEOUS
  Administered 2023-01-13: 4 [IU] via SUBCUTANEOUS
  Administered 2023-01-14 (×2): 7 [IU] via SUBCUTANEOUS
  Administered 2023-01-14: 4 [IU] via SUBCUTANEOUS
  Administered 2023-01-14: 11 [IU] via SUBCUTANEOUS
  Administered 2023-01-14: 3 [IU] via SUBCUTANEOUS
  Administered 2023-01-14 – 2023-01-15 (×2): 4 [IU] via SUBCUTANEOUS
  Administered 2023-01-15: 3 [IU] via SUBCUTANEOUS
  Administered 2023-01-15 (×3): 4 [IU] via SUBCUTANEOUS
  Administered 2023-01-15: 7 [IU] via SUBCUTANEOUS
  Administered 2023-01-16: 3 [IU] via SUBCUTANEOUS
  Administered 2023-01-16: 4 [IU] via SUBCUTANEOUS
  Administered 2023-01-16: 3 [IU] via SUBCUTANEOUS
  Administered 2023-01-16 – 2023-01-17 (×3): 4 [IU] via SUBCUTANEOUS
  Administered 2023-01-17: 7 [IU] via SUBCUTANEOUS
  Administered 2023-01-17: 4 [IU] via SUBCUTANEOUS
  Administered 2023-01-18 (×3): 3 [IU] via SUBCUTANEOUS
  Filled 2023-01-13 (×24): qty 1

## 2023-01-13 MED ORDER — BUDESONIDE 0.25 MG/2ML IN SUSP
0.2500 mg | Freq: Two times a day (BID) | RESPIRATORY_TRACT | Status: DC
  Administered 2023-01-13 – 2023-01-21 (×15): 0.25 mg via RESPIRATORY_TRACT
  Filled 2023-01-13 (×17): qty 2

## 2023-01-13 NOTE — Progress Notes (Signed)
NAME:  Cody Alexander, MRN:  [id number], DOB:  06-Jan-1956, LOS: 8 ADMISSION DATE:  01/05/2023, CONSULTATION DATE: 01/08/2023 REFERRING MD: Dr. Georgeann Oppenheim, CHIEF COMPLAINT: AMS    History of Present Illness:  This is a 67 yo male with a PMH of COPD and Chronic Systolic Diastolic CHF.  He presented to [facility] ER on 05/12 from home via EMS with c/o shortness of breath onset 05/12.  Per ER notes EMS reported pt wears 4L O2 chronically, however he has been noncompliant with his oxygen.  When EMS arrival at pts home he was noted to be diaphoretic with initial O2 sats @86 % on 4L O2.    ED Course  Upon arrival to the ER pt alert/oriented but remained diaphoretic/tachypneic requiring 15L NRB with resolution of hypoxia.  Pt placed on Bipap.  Pt reported he had some lower extremity swelling and inability to void.  CXR concerning for mild interstitial edema.  CT Renal stone study revealed no nephrolithiasis, ureterolithiasis, or obstructive uropathy but a small amount of gas present in the bladder presumably related to procedure and small bladder diverticula.  CTA Chest negative for PE or acute pulmonary parenchymal findings.  UA concerning for possible UTI.  Pt received cefepime.  He was subsequently admitted to the progressive care unit for additional workup and treatment.  See detailed hospital course below under significant events   Pertinent  Medical History  COPD Chronic O2 @4L   Type II Diabetes Mellitus  Essential HTN  Neuropathy  Chronic Systolic Diastolic CHF  OSA  MI  Obesity  Substance Abuse   Significant Hospital Events: Including procedures, antibiotic start and stop dates in addition to other pertinent events   05/13: Pt admitted to the progressive care unit with acute on chronic hypoxic respiratory failure secondary to acute on chronic CHF exacerbation, UTI, and NSTEMI  05/15: Pt developed acute encephalopathy and acute hypercapnic hypoxic respiratory failure overnight requiring portable  Bipap.  However, pt failed Bipap developed jerking movements concerning for myoclonus requiring transfer to ICU. PCCM team consulted and pt mechanically intubated  01/09/23- patient had abnormal EKG today but repeat cardiac biomarkers seemed to be normal. He is on MV today he is not ready for SBT.  We did deliver diamox today to help with acid base disorder.  01/10/23- for SBT today, on insulin gtt, electrolytes are improved. Reducing steroids today.  01/11/23- failed SBT today. >100k colonies of ecoli on Urine culture. BP is low today , have dcd aldactone and flomax. Continuing rocephin, unable to liberate from MV today. 01/12/23- PRVC weaned to 50%, secretions improved.  5/20 severe hypoxia, failure to wean from vent  Interim History / Subjective:  Remains intubated No family available Remains on vent Severe hypoxia  Vent Mode: PRVC FiO2 (%):  [50 %] 50 % Set Rate:  [22 bmp] 22 bmp Vt Set:  [550 mL] 550 mL PEEP:  [5 cmH20] 5 cmH20 Plateau Pressure:  [19 cmH20-24 cmH20] 19 cmH20   Objective   Blood pressure 94/61, pulse 74, temperature 98.6 F (37 C), temperature source Axillary, resp. rate (!) 22, height 6\' 1"  (1.854 m), weight (!) 140.2 kg, SpO2 92 %.    Vent Mode: PRVC FiO2 (%):  [50 %] 50 % Set Rate:  [22 bmp] 22 bmp Vt Set:  [550 mL] 550 mL PEEP:  [5 cmH20] 5 cmH20 Plateau Pressure:  [19 cmH20-24 cmH20] 19 cmH20   Intake/Output Summary (Last 24 hours) at 01/13/2023 0758 Last data filed at 01/13/2023 0600 Gross per 24 hour  Intake --  Output 2450 ml  Net -2450 ml    Filed Weights   01/11/23 0400 01/12/23 0348 01/13/23 0500  Weight: (!) 148 kg (!) 140 kg (!) 140.2 kg      REVIEW OF SYSTEMS  PATIENT IS UNABLE TO PROVIDE COMPLETE REVIEW OF SYSTEMS DUE TO SEVERE CRITICAL ILLNESS   PHYSICAL EXAMINATION:  GENERAL:critically ill appearing, +resp distress EYES: Pupils equal, round, reactive to light.  No scleral icterus.  MOUTH: Moist mucosal membrane. INTUBATED NECK:  Supple.  PULMONARY: Lungs clear to auscultation, +rhonchi, +wheezing CARDIOVASCULAR: S1 and S2.  Regular rate and rhythm GASTROINTESTINAL: Soft, nontender, -distended. Positive bowel sounds.  MUSCULOSKELETAL: No swelling, clubbing, or edema.  NEUROLOGIC: obtunded,sedated SKIN:normal, warm to touch, Capillary refill delayed  Pulses present bilaterally   Assessment & Plan:  Acute on chronic hypoxic hypercapnic respiratory failure secondary  To SEVERE AECOPD and acute DECOMPENSATED HfPEF CHF exacerbation WITH EF >55%   Severe ACUTE Hypoxic and Hypercapnic Respiratory Failure -continue Mechanical Ventilator support -Wean Fio2 and PEEP as tolerated -VAP/VENT bundle implementation - Wean PEEP & FiO2 as tolerated, maintain SpO2 > 88% - Head of bed elevated 30 degrees, VAP protocol in place - Plateau pressures less than 30 cm H20  - Intermittent chest x-ray & ABG PRN - Ensure adequate pulmonary hygiene  Unable to wean from vent today   SEVERE COPD EXACERBATION -continue IV steroids as prescribed -continue NEB THERAPY as prescribed -wean fio2 as needed and tolerated    Elevated Troponin secondary to demand ischemia  Acute on chronic diastolic systolic CHF  - Trend troponins  - consider systemic anticoagulation as troponin trend suggests - continuous cardiac monitoring  Prn levophed gtt to maintain map >65  - Hold outpatient antihypertensives and beta-blockers for now  - Continue aspirin and atorvastatin  - Cardiology consulted appreciate input   ACUTE KIDNEY INJURY/Renal Failure -continue Foley Catheter-assess need -Avoid nephrotoxic agents -Follow urine output, BMP -Ensure adequate renal perfusion, optimize oxygenation -Renal dose medications   Intake/Output Summary (Last 24 hours) at 01/13/2023 0801 Last data filed at 01/13/2023 0600 Gross per 24 hour  Intake --  Output 2450 ml  Net -2450 ml      Latest Ref Rng & Units 01/12/2023    4:48 AM 01/11/2023    5:00 AM  01/10/2023    3:19 AM  BMP  Glucose 70 - 99 mg/dL 409  811  914   BUN 8 - 23 mg/dL 59  53  28   Creatinine 0.61 - 1.24 mg/dL 7.82  9.56  2.13   Sodium 135 - 145 mmol/L 141  138  136   Potassium 3.5 - 5.1 mmol/L 4.8  4.6  3.1   Chloride 98 - 111 mmol/L 106  104  95   CO2 22 - 32 mmol/L 30  27  29    Calcium 8.9 - 10.3 mg/dL 8.8  8.7  9.1     UTI secondary to Ecoli  - Trend WBC and monitor fever curve  - Trend PCT  - Continue ceftriaxone    NEUROLOGY ACUTE METABOLIC ENCEPHALOPATHY -need for sedation -Goal RASS -2 to -3   ENDO - ICU hypoglycemic\Hyperglycemia protocol -check FSBS per protocol   GI GI PROPHYLAXIS as indicated  NUTRITIONAL STATUS DIET-->TF's as tolerated Constipation protocol as indicated   ELECTROLYTES -follow labs as needed -replace as needed -pharmacy consultation and following   Best Practice (right click and "Reselect all SmartList Selections" daily)   Diet/type: NPO DVT prophylaxis: LMWH GI prophylaxis: H2B  Lines: N/A Foley:  N/A Code Status:  full code Last date of multidisciplinary goals of care discussion [N/A]  05/15: Attempted to contact pts friend Lalla Brothers utilizing phone number listed in epic, however the phone number listed in epic is the wrong number.  No other family contact information available in epic.  5/20 palliative care team consulted Labs   CBC: Recent Labs  Lab 01/08/23 0431 01/09/23 0507 01/10/23 0319 01/11/23 0500 01/12/23 0448  WBC 7.3 8.6 17.5* 15.5* 12.2*  HGB 13.1 12.8* 14.7 12.7* 12.6*  HCT 44.4 40.5 44.6 39.7 40.2  MCV 95.7 90.6 87.5 89.8 91.6  PLT 193 210 229 226 251     Basic Metabolic Panel: Recent Labs  Lab 01/08/23 0431 01/08/23 1450 01/08/23 1457 01/09/23 0507 01/09/23 1611 01/09/23 2051 01/10/23 0319 01/11/23 0500 01/12/23 0448  NA 136  --   --  137  --   --  136 138 141  K 4.1   < >  --  3.2* 3.3* 3.3* 3.1* 4.6 4.8  CL 90*  --   --  91*  --   --  95* 104 106  CO2 38*  --    --  34*  --   --  29 27 30   GLUCOSE 219*  --   --  173*  --   --  251* 239* 221*  BUN 26*  --   --  27*  --   --  28* 53* 59*  CREATININE 1.27*  --   --  1.19  --   --  1.01 1.09 1.06  CALCIUM 8.7*  --   --  8.7*  --   --  9.1 8.7* 8.8*  MG  --   --  2.3 2.2 2.5*  --  2.7*  --   --   PHOS  --   --  2.1* 1.5* 5.1* 3.9 2.7  --   --    < > = values in this interval not displayed.    GFR: Estimated Creatinine Clearance: 100.8 mL/min (by C-G formula based on SCr of 1.06 mg/dL). Recent Labs  Lab 01/08/23 1457 01/09/23 0507 01/10/23 0319 01/11/23 0500 01/12/23 0448  WBC  --  8.6 17.5* 15.5* 12.2*  LATICACIDVEN 1.7  --   --   --   --      Liver Function Tests: Recent Labs  Lab 01/09/23 2051  AST 36  ALT 23  ALKPHOS 52  BILITOT 1.0  PROT 7.1  ALBUMIN 3.6    No results for input(s): "LIPASE", "AMYLASE" in the last 168 hours. Recent Labs  Lab 01/07/23 2256  AMMONIA 45*     ABG    Component Value Date/Time   PHART 7.44 01/09/2023 1517   PCO2ART 54 (H) 01/09/2023 1517   PO2ART 62 (L) 01/09/2023 1517   HCO3 36.7 (H) 01/09/2023 1517   O2SAT 92.4 01/09/2023 1517     Coagulation Profile: Recent Labs  Lab 01/07/23 2306  INR 1.0     Cardiac Enzymes: Recent Labs  Lab 01/08/23 1450  CKTOTAL 917*     HbA1C: Hemoglobin A1C  Date/Time Value Ref Range Status  08/12/2013 04:01 AM 6.9 (H) 4.2 - 6.3 % Final    Comment:    The American Diabetes Association recommends that a primary goal of therapy should be <7% and that physicians should reevaluate the treatment regimen in patients with HbA1c values consistently >8%.    Hgb A1c MFr Bld  Date/Time Value Ref Range Status  01/08/2023 02:57 PM 9.4 (H) 4.8 - 5.6 % Final    Comment:    (NOTE) Pre diabetes:          5.7%-6.4%  Diabetes:              >6.4%  Glycemic control for   <7.0% adults with diabetes   11/10/2022 12:36 AM 11.2 (H) 4.8 - 5.6 % Final    Comment:    (NOTE)         Prediabetes: 5.7 - 6.4          Diabetes: >6.4         Glycemic control for adults with diabetes: <7.0     CBG: Recent Labs  Lab 01/12/23 1533 01/12/23 1932 01/12/23 2316 01/13/23 0333 01/13/23 0744  GLUCAP 154* 172* 200* 108* 150*     DVT/GI PRX  assessed I Assessed the need for Labs I Assessed the need for Foley I Assessed the need for Central Venous Line Family Discussion when available I Assessed the need for Mobilization I made an Assessment of medications to be adjusted accordingly Safety Risk assessment completed  CASE DISCUSSED IN MULTIDISCIPLINARY ROUNDS WITH ICU TEAM     Critical Care Time devoted to patient care services described in this note is 55 minutes.  Critical care was necessary to treat /prevent imminent and life-threatening deterioration. Overall, patient is critically ill, prognosis is guarded.  Patient with Multiorgan failure and at high risk for cardiac arrest and death.    Lucie Leather, M.D.  [facility]  Medical Director [facility] Medical Director [facility] Cardio-Pulmonary Department

## 2023-01-13 NOTE — Progress Notes (Signed)
SUBJECTIVE: Patient is a 67 y.o. 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  Patient states he had not been feeling well for a few days. On Sunday. he drove out of town with a friend, heavily exerted himself while walking around outside. Patient proceeded to drive himself home when he began to feel chest discomfort in the center of his chest, short of breath, diaphoretic, loss of bladder and bowel control. After patient arrived at home, EMS was called to bring him to the ED.    Patient is established with [facility] cardiology, is non-compliant with follow up appointments.    Overnight, patient developed acute encephalopathy with concern for hypercapnia, acute respiratory failure requiring BiPAP, hyperammonemia. Troponin levels repeated, 71>75. Repeat EKG, in sinus rhythm, no acute changes compared to EKG on arrival to ED.    Rapid response called on 01/08/23, patient developed acute encephalopathy and acute hypercapnic hypoxic respiratory failure requiring BiPAP. Patient failed BiPAP, developed jerking movements concerning for myoclonus. Patient was subsequently transferred to the ICU where he was mechanically intubated.    01/09/23 EKG performed for ST elevation on monitor. EKG showed no significant ST elevation inferolaterally. Troponin levels repeated, 84>78>65>62.   Attempt made to wean patient from the vent on 01/12/23, severe hypoxia, failure to wean.  Vitals:   01/13/23 0600 01/13/23 0752 01/13/23 0800 01/13/23 0827  BP: 94/61  (!) 94/57   Pulse: 74  72   Resp: (!) 22  (!) 22   Temp:   98.1 F (36.7 C) (!) 101 F (38.3 C)  TempSrc:   Oral Axillary  SpO2: 90% 92% 91%   Weight:      Height:         Intake/Output Summary (Last 24 hours) at 01/13/2023 0855 Last data filed at 01/13/2023 0600 Gross per 24 hour  Intake --  Output 2450 ml  Net -2450 ml    LABS: Basic Metabolic Panel: Recent Labs    01/11/23 0500 01/12/23 0448  NA 138 141  K 4.6 4.8  CL 104 106  CO2 27 30  GLUCOSE 239* 221*  BUN 53* 59*  CREATININE 1.09 1.06  CALCIUM 8.7* 8.8*   Liver Function Tests: No results for input(s): "AST", "ALT", "ALKPHOS", "BILITOT", "PROT", "ALBUMIN" in the last 72 hours. No results for input(s): "LIPASE", "AMYLASE" in the last 72 hours. CBC: Recent Labs    01/11/23 0500 01/12/23 0448  WBC 15.5* 12.2*  HGB 12.7* 12.6*  HCT 39.7 40.2  MCV 89.8 91.6  PLT 226 251   Cardiac Enzymes: No results for input(s): "CKTOTAL", "CKMB", "CKMBINDEX", "TROPONINI" in the last 72 hours. BNP: Invalid input(s): "POCBNP" D-Dimer: No results for input(s): "DDIMER" in the last 72 hours. Hemoglobin A1C: No results for input(s): "HGBA1C" in the last 72 hours. Fasting Lipid Panel: Recent Labs    01/11/23 0500  TRIG 120   Thyroid Function Tests: No results for input(s): "TSH", "T4TOTAL", "T3FREE", "THYROIDAB" in the last 72 hours.  Invalid input(s): "FREET3" Anemia Panel: No results for input(s): "VITAMINB12", "FOLATE", "FERRITIN", "TIBC", "IRON", "RETICCTPCT" in the last 72 hours.   PHYSICAL EXAM General: acutely ill appearing, mechanically intubated HEENT:  Normocephalic and atramatic Neck:  No JVD.  Lungs: Clear bilaterally  to auscultation, even, non-labored Heart: HRRR . Normal S1 and S2 without gallops or murmurs.  Abdomen: Bowel sounds are positive, abdomen soft and non-tender  Extremities: No clubbing, cyanosis or edema.   Neuro: sedated Psych:  sedated  TELEMETRY: sinus rhythm, HR 79 bpm  ASSESSMENT AND PLAN: Patient is a 67 y.o. male with medical history significant for combined CHF with recovered EF (EF 65% G1 DD 07/22/2022), COPD on home O2 at 4L, CAD s/p stent x1,  HTN, T2DM , class III obesity, OSA who presented to the ED on 01/05/23 for chest discomfort and shortness of breath. Echo 01/07/23 revealed EF 65-70%, severe LVH, normal wall motion, grade II DD. Troponin levels continued to be flat. Consider further cardiac workup possibly cardiac cath when the patient has stabilized. Respiratory distress likely from COPD exacerbation. Will continue to follow.    ICD-10-CM   1. Respiratory distress  R06.03     2. Congestive heart failure, unspecified HF chronicity, unspecified heart failure type (HCC)  I50.9     3. Lower urinary tract infection  N39.0       Principal Problem:   NSTEMI (non-ST elevated myocardial infarction) (HCC) Active Problems:   Chronic obstructive pulmonary disease (COPD) (HCC)   Type 2 diabetes mellitus with hyperglycemia, with long-term current use of insulin (HCC)   Essential hypertension   Obesity, Class III, BMI 40-49.9 (morbid obesity) (HCC)   Acute on chronic respiratory failure with hypoxia (HCC)   Acute on chronic diastolic CHF (congestive heart failure) (HCC)   OSA (obstructive sleep apnea)   Benign prostatic hyperplasia with urinary hesitancy   Urinary tract infection   Hypokalemia   History of kidney stones    Danuta Huseman, FNP-C 01/13/2023 8:55 AM

## 2023-01-14 DIAGNOSIS — I214 Non-ST elevation (NSTEMI) myocardial infarction: Secondary | ICD-10-CM | POA: Diagnosis not present

## 2023-01-14 DIAGNOSIS — R079 Chest pain, unspecified: Secondary | ICD-10-CM | POA: Diagnosis not present

## 2023-01-14 DIAGNOSIS — R0603 Acute respiratory distress: Secondary | ICD-10-CM | POA: Diagnosis not present

## 2023-01-14 LAB — BASIC METABOLIC PANEL
Anion gap: 7 (ref 5–15)
BUN: 61 mg/dL — ABNORMAL HIGH (ref 8–23)
CO2: 30 mmol/L (ref 22–32)
Calcium: 8.5 mg/dL — ABNORMAL LOW (ref 8.9–10.3)
Chloride: 108 mmol/L (ref 98–111)
Creatinine, Ser: 0.98 mg/dL (ref 0.61–1.24)
GFR, Estimated: >60 mL/min (ref >60)
Glucose, Bld: 153 mg/dL — ABNORMAL HIGH (ref 70–99)
Potassium: 5.1 mmol/L (ref 3.5–5.1)
Sodium: 145 mmol/L (ref 135–145)

## 2023-01-14 LAB — GLUCOSE, CAPILLARY
Glucose-Capillary: 179 mg/dL — ABNORMAL HIGH (ref 70–99)
Glucose-Capillary: 129 mg/dL — ABNORMAL HIGH (ref 70–99)
Glucose-Capillary: 190 mg/dL — ABNORMAL HIGH (ref 70–99)
Glucose-Capillary: 255 mg/dL — ABNORMAL HIGH (ref 70–99)
Glucose-Capillary: 226 mg/dL — ABNORMAL HIGH (ref 70–99)
Glucose-Capillary: 246 mg/dL — ABNORMAL HIGH (ref 70–99)

## 2023-01-14 LAB — CULTURE, RESPIRATORY W GRAM STAIN

## 2023-01-14 LAB — CBC
HCT: 42 % (ref 39.0–52.0)
Hemoglobin: 12.8 g/dL — ABNORMAL LOW (ref 13.0–17.0)
MCH: 28.6 pg (ref 26.0–34.0)
MCHC: 30.5 g/dL (ref 30.0–36.0)
MCV: 94 fL (ref 80.0–100.0)
Platelets: 281 10*3/uL (ref 150–400)
RBC: 4.47 MIL/uL (ref 4.22–5.81)
RDW: 14.3 % (ref 11.5–15.5)
WBC: 10.5 10*3/uL (ref 4.0–10.5)
nRBC: 0 % (ref 0.0–0.2)

## 2023-01-14 LAB — TRIGLYCERIDES: Triglycerides: 131 mg/dL (ref <150)

## 2023-01-14 LAB — MAGNESIUM: Magnesium: 3.2 mg/dL — ABNORMAL HIGH (ref 1.7–2.4)

## 2023-01-14 MED ORDER — VANCOMYCIN HCL 1250 MG/250ML IV SOLN
1250.0000 mg | Freq: Two times a day (BID) | INTRAVENOUS | Status: DC
  Administered 2023-01-15 – 2023-01-16 (×3): 1250 mg via INTRAVENOUS
  Filled 2023-01-14 (×3): qty 250

## 2023-01-14 MED ORDER — NOREPINEPHRINE 4 MG/250ML-% IV SOLN
2.0000 ug/min | INTRAVENOUS | Status: DC
  Administered 2023-01-14: 2 ug/min via INTRAVENOUS
  Administered 2023-01-15: 8 ug/min via INTRAVENOUS
  Administered 2023-01-15: 9 ug/min via INTRAVENOUS
  Filled 2023-01-14 (×3): qty 250

## 2023-01-14 MED ORDER — VANCOMYCIN HCL 500 MG/100ML IV SOLN
500.0000 mg | Freq: Once | INTRAVENOUS | Status: AC
  Administered 2023-01-14: 500 mg via INTRAVENOUS
  Filled 2023-01-14: qty 100

## 2023-01-14 MED ORDER — SODIUM CHLORIDE 0.9 % IV SOLN
2.0000 g | Freq: Three times a day (TID) | INTRAVENOUS | Status: AC
  Administered 2023-01-14 – 2023-01-19 (×16): 2 g via INTRAVENOUS
  Filled 2023-01-14 (×17): qty 12.5

## 2023-01-14 MED ORDER — SODIUM ZIRCONIUM CYCLOSILICATE 5 G PO PACK
5.0000 g | PACK | Freq: Once | ORAL | Status: AC
  Administered 2023-01-14: 5 g

## 2023-01-14 MED ORDER — VANCOMYCIN HCL 2000 MG/400ML IV SOLN
2000.0000 mg | Freq: Once | INTRAVENOUS | Status: AC
  Administered 2023-01-14: 2000 mg via INTRAVENOUS
  Filled 2023-01-14: qty 400

## 2023-01-14 MED ORDER — SODIUM CHLORIDE 0.9 % IV SOLN
250.0000 mL | INTRAVENOUS | Status: DC
  Administered 2023-01-14 – 2023-01-17 (×2): 250 mL via INTRAVENOUS

## 2023-01-14 NOTE — Progress Notes (Signed)
Pharmacy Antibiotic Note  Cody Alexander is a 67 y.o. male admitted on 01/05/2023 with pneumonia.  Pharmacy has been consulted for vancomycin and cefepime dosing.  Plan: Vancomycin 2500 mg loading dose  Vancomycin 1250 mg every 12 hours Goal AUC 400-550 Estimated AUC 458.1, Cmin 13.2  Cefepime 2 grams every 8 hours  Height: 6\' 1"  (185.4 cm) Weight: (!) 147.6 kg (325 lb 6.4 oz) IBW/kg (Calculated) : 79.9  Temp (24hrs), Avg:98.4 F (36.9 C), Min:98.1 F (36.7 C), Max:98.5 F (36.9 C)  Recent Labs  Lab 01/08/23 1457 01/09/23 0507 01/10/23 0319 01/11/23 0500 01/12/23 0448 01/14/23 0444  WBC  --  8.6 17.5* 15.5* 12.2* 10.5  CREATININE  --  1.19 1.01 1.09 1.06 0.98  LATICACIDVEN 1.7  --   --   --   --   --     Estimated Creatinine Clearance: 112.2 mL/min (by C-G formula based on SCr of 0.98 mg/dL).    Allergies  Allergen Reactions   Erythromycin Anaphylaxis and Swelling    Had eye swelling with erythromycin during a time when he had perf ear drum.  Tolerated azithromycin.    Antimicrobials this admission: cefepime 5/12 >> 5/13 ceftriaxone 5/13 >> 5/17  Dose adjustments this admission: N/a  Microbiology results: 5/15 Trach aspirate: rare GPCs in pairs 5/12 UCx: E. coli    Thank you for allowing pharmacy to be a part of this patient's care.  Jaynie Bream 01/14/2023 5:42 PM

## 2023-01-14 NOTE — TOC Progression Note (Addendum)
Transition of Care [facility]) - Progression Note    Patient Details  Name: Cody Alexander MRN: [id number] Date of Birth: Jul 13, 1956  [facility] CM/SW Contact  Darolyn Rua, [place] Phone Number: 01/14/2023, 4:26 PM  Clinical Narrative:     CSW spoke with palliative NP who reports need to verify with patient's daughter that they do not want to be involved, as to move forward with cousin Cody Alexander as Management consultant.   CSW called Cody Alexander, no answer, texted and she did respond that patient has 1 daughter Cody Alexander and she will see what seh can do about getting contact information. Reports her phone was messed up and she just got another one, requested phone number to ICU to check on patient. CSW provided. She asked if patient is off of vent, CSW updated per MD he has been unable to wean off the vent. Encouraged Cody Alexander to return call to palliative and to reach out to ICU RN for medical updates.   Cody Alexander reports she has sent patient's daughter Cody Alexander an FB message asking she call this CSW to confirm she does not want to be decision maker, Cody Alexander reports she herself does not have [facility] number as she has not shared it with her. Palliative NP updated.    Barriers to Discharge: Continued Medical Work up  Expected Discharge Plan and Services       Living arrangements for the past 2 months: Single Family Home                                       Social Determinants of Health (SDOH) Interventions SDOH Screenings   Food Insecurity: No Food Insecurity (11/10/2022)  Housing: Low Risk  (11/10/2022)  Transportation Needs: No Transportation Needs (11/10/2022)  Utilities: Not At Risk (11/10/2022)  Tobacco Use: Medium Risk (01/05/2023)    Readmission Risk Interventions    06/25/2022   10:13 AM  Readmission Risk Prevention Plan  Transportation Screening Complete  PCP or Specialist Appt within 3-5 Days Complete  HRI or Home Care Consult Complete  Social Work Consult for  Recovery Care Planning/Counseling Complete  Palliative Care Screening Not Applicable  Medication Review Oceanographer) Complete

## 2023-01-14 NOTE — Progress Notes (Signed)
SUBJECTIVE: Patient is a 67 y.o. 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  Patient states he had not been feeling well for a few days. On Sunday. he drove out of town with a friend, heavily exerted himself while walking around outside. Patient proceeded to drive himself home when he began to feel chest discomfort in the center of his chest, short of breath, diaphoretic, loss of bladder and bowel control. After patient arrived at home, EMS was called to bring him to the ED.    Patient is established with [facility] cardiology, is non-compliant with follow up outpatient appointments.    Overnight, patient developed acute encephalopathy with concern for hypercapnia, acute respiratory failure requiring BiPAP, hyperammonemia. Troponin levels repeated, 71>75. Repeat EKG, in sinus rhythm, no acute changes compared to EKG on arrival to ED.    Rapid response called on 01/08/23, patient developed acute encephalopathy and acute hypercapnic hypoxic respiratory failure requiring BiPAP. Patient failed BiPAP, developed jerking movements concerning for myoclonus. Patient was subsequently transferred to the ICU where he was mechanically intubated.    01/09/23 EKG performed for ST elevation on monitor. EKG showed no significant ST elevation inferolaterally. Troponin levels repeated, 84>78>65>62.    Attempt made to wean patient from the vent on 01/12/23 and 01/13/23, severe hypoxia, failure to wean.   Vitals:   01/14/23 0600 01/14/23 0630 01/14/23 0700 01/14/23 0800  BP: 104/64 97/68 (!) 97/56 (!) 91/53  Pulse: 80 79 79 80  Resp: (!) 22 (!) 22 (!) 22 (!) 22  Temp:      TempSrc:      SpO2: 96% 95% 95% 93%  Weight:      Height:         Intake/Output Summary (Last 24 hours) at 01/14/2023 0855 Last data filed at 01/14/2023 0409 Gross per 24 hour  Intake 2556.31 ml  Output 1775 ml  Net 781.31 ml    LABS: Basic Metabolic Panel: Recent Labs    01/12/23 0448 01/14/23 0444  NA 141 145  K 4.8 5.1  CL 106 108  CO2 30 30  GLUCOSE 221* 153*  BUN 59* 61*  CREATININE 1.06 0.98  CALCIUM 8.8* 8.5*  MG  --  3.2*   Liver Function Tests: No results for input(s): "AST", "ALT", "ALKPHOS", "BILITOT", "PROT", "ALBUMIN" in the last 72 hours. No results for input(s): "LIPASE", "AMYLASE" in the last 72 hours. CBC: Recent Labs    01/12/23 0448 01/14/23 0444  WBC 12.2* 10.5  HGB 12.6* 12.8*  HCT 40.2 42.0  MCV 91.6 94.0  PLT 251 281   Cardiac Enzymes: No results for input(s): "CKTOTAL", "CKMB", "CKMBINDEX", "TROPONINI" in the last 72 hours. BNP: Invalid input(s): "POCBNP" D-Dimer: No results for input(s): "DDIMER" in the last 72 hours. Hemoglobin A1C: No results for input(s): "HGBA1C" in the last 72 hours. Fasting Lipid Panel: Recent Labs    01/14/23 0444  TRIG 131   Thyroid Function Tests: No results for input(s): "TSH", "T4TOTAL", "T3FREE", "THYROIDAB" in the last 72 hours.  Invalid input(s): "FREET3" Anemia Panel: No results for input(s): "VITAMINB12", "FOLATE", "FERRITIN", "TIBC", "IRON", "RETICCTPCT" in the last 72 hours.   PHYSICAL EXAM General:  acutely ill appearing, mechanically intubated  HEENT:  Normocephalic  and atramatic Neck:  No JVD.  Lungs: Clear bilaterally to auscultation, even, non-labored. Heart: HRRR . Normal S1 and S2 without gallops or murmurs.  Abdomen: Bowel sounds are positive, abdomen soft and non-tender  Extremities: No clubbing, cyanosis or edema.   Neuro: sedated. Psych:  sedated  TELEMETRY: sinus rhythm, HR 76 bpm  ASSESSMENT AND PLAN: Patient is a 67 y.o. male with medical history significant for combined CHF with recovered EF (EF 65% G1 DD 07/22/2022), COPD on home  O2 at 4L, CAD s/p stent x1, HTN, T2DM , class III obesity, OSA who presented to the ED on 01/05/23 for chest discomfort and shortness of breath. Echo 01/07/23 revealed EF 65-70%, severe LVH, normal wall motion, grade II DD. Troponin levels continued to be flat. Consider further cardiac workup possibly cardiac cath when the patient has been extubated. Patient remains critically ill.  Respiratory distress likely from COPD exacerbation. Will continue to follow.    ICD-10-CM   1. Respiratory distress  R06.03     2. Congestive heart failure, unspecified HF chronicity, unspecified heart failure type (HCC)  I50.9     3. Lower urinary tract infection  N39.0       Principal Problem:   NSTEMI (non-ST elevated myocardial infarction) (HCC) Active Problems:   Chronic obstructive pulmonary disease (COPD) (HCC)   Type 2 diabetes mellitus with hyperglycemia, with long-term current use of insulin (HCC)   Essential hypertension   Obesity, Class III, BMI 40-49.9 (morbid obesity) (HCC)   Acute on chronic respiratory failure with hypoxia (HCC)   Acute on chronic diastolic CHF (congestive heart failure) (HCC)   OSA (obstructive sleep apnea)   Benign prostatic hyperplasia with urinary hesitancy   Urinary tract infection   Hypokalemia   History of kidney stones    Kylil Swopes, FNP-C 01/14/2023 8:55 AM

## 2023-01-14 NOTE — Progress Notes (Signed)
Pressure support started for pressures this evening. PRN medication given per order. Patient with thick, copious secretions after turning in bed. MD made aware. New orders placed.

## 2023-01-14 NOTE — Plan of Care (Signed)
Consult for GOC noted. No family at bedside. Spoke with TOC who states patient has children, but others family/friends involved do not know how to reach them. Attempted to call Marylene Land unsuccessfully. Will reattempt at a later time.

## 2023-01-14 NOTE — Progress Notes (Signed)
VAST consult received to obtain USGIV for pressors. Pt has 2 USGIVs in place. Spoke with pt's nurse who stated no further IV access is needed at this time.

## 2023-01-14 NOTE — Progress Notes (Signed)
NAME:  Cody Alexander, MRN:  [id number], DOB:  1955/09/07, LOS: 9 ADMISSION DATE:  01/05/2023, CONSULTATION DATE: 01/08/2023 REFERRING MD: Dr. Georgeann Oppenheim, CHIEF COMPLAINT: AMS    History of Present Illness:  This is a 67 yo male with a PMH of COPD and Chronic Systolic Diastolic CHF.  He presented to [facility] ER on 05/12 from home via EMS with c/o shortness of breath onset 05/12.  Per ER notes EMS reported pt wears 4L O2 chronically, however he has been noncompliant with his oxygen.  When EMS arrival at pts home he was noted to be diaphoretic with initial O2 sats @86 % on 4L O2.    ED Course  Upon arrival to the ER pt alert/oriented but remained diaphoretic/tachypneic requiring 15L NRB with resolution of hypoxia.  Pt placed on Bipap.  Pt reported he had some lower extremity swelling and inability to void.  CXR concerning for mild interstitial edema.  CT Renal stone study revealed no nephrolithiasis, ureterolithiasis, or obstructive uropathy but a small amount of gas present in the bladder presumably related to procedure and small bladder diverticula.  CTA Chest negative for PE or acute pulmonary parenchymal findings.  UA concerning for possible UTI.  Pt received cefepime.  He was subsequently admitted to the progressive care unit for additional workup and treatment.  See detailed hospital course below under significant events   Pertinent  Medical History  COPD Chronic O2 @4L   Type II Diabetes Mellitus  Essential HTN  Neuropathy  Chronic Systolic Diastolic CHF  OSA  MI  Obesity  Substance Abuse   Significant Hospital Events: Including procedures, antibiotic start and stop dates in addition to other pertinent events   05/13: Pt admitted to the progressive care unit with acute on chronic hypoxic respiratory failure secondary to acute on chronic CHF exacerbation, UTI, and NSTEMI  05/15: Pt developed acute encephalopathy and acute hypercapnic hypoxic respiratory failure overnight requiring portable  Bipap.  However, pt failed Bipap developed jerking movements concerning for myoclonus requiring transfer to ICU. PCCM team consulted and pt mechanically intubated  01/09/23- patient had abnormal EKG today but repeat cardiac biomarkers seemed to be normal. He is on MV today he is not ready for SBT.  We did deliver diamox today to help with acid base disorder.  01/10/23- for SBT today, on insulin gtt, electrolytes are improved. Reducing steroids today.  01/11/23- failed SBT today. >100k colonies of ecoli on Urine culture. BP is low today , have dcd aldactone and flomax. Continuing rocephin, unable to liberate from MV today. 01/12/23- PRVC weaned to 50%, secretions improved.  5/20 severe hypoxia, failure to wean from vent 5/21 severe COPD, hypoxia-failure to wean from vent  Interim History / Subjective:  Remains intubated Severe hypoxia Remains critically ill Patient will likely need TRACH and PEG tube for survival Palliative care team consulted  Vent Mode: PRVC FiO2 (%):  [50 %-75 %] 70 % Set Rate:  [22 bmp] 22 bmp Vt Set:  [550 mL] 550 mL PEEP:  [5 cmH20-10 cmH20] 10 cmH20 Plateau Pressure:  [19 cmH20-24 cmH20] 23 cmH20   Objective   Blood pressure (!) 97/56, pulse 79, temperature 98.5 F (36.9 C), temperature source Axillary, resp. rate (!) 22, height 6\' 1"  (1.854 m), weight (!) 147.6 kg, SpO2 95 %.    Vent Mode: PRVC FiO2 (%):  [50 %-75 %] 70 % Set Rate:  [22 bmp] 22 bmp Vt Set:  [550 mL] 550 mL PEEP:  [5 cmH20-10 cmH20] 10 cmH20 Plateau Pressure:  [19  AVW09-81 cmH20] 23 cmH20   Intake/Output Summary (Last 24 hours) at 01/14/2023 0721 Last data filed at 01/14/2023 0409 Gross per 24 hour  Intake 2556.31 ml  Output 1950 ml  Net 606.31 ml    Filed Weights   01/12/23 0348 01/13/23 0500 01/14/23 0400  Weight: (!) 140 kg (!) 140.2 kg (!) 147.6 kg      REVIEW OF SYSTEMS  PATIENT IS UNABLE TO PROVIDE COMPLETE REVIEW OF SYSTEMS DUE TO SEVERE CRITICAL ILLNESS   PHYSICAL  EXAMINATION:  GENERAL:critically ill appearing, +resp distress EYES: Pupils equal, round, reactive to light.  No scleral icterus.  MOUTH: Moist mucosal membrane. INTUBATED NECK: Supple.  PULMONARY: Lungs clear to auscultation, +rhonchi, +wheezing CARDIOVASCULAR: S1 and S2.  Regular rate and rhythm GASTROINTESTINAL: Soft, nontender, -distended. Positive bowel sounds.  MUSCULOSKELETAL: No swelling, clubbing, or edema.  NEUROLOGIC: obtunded,sedated SKIN:normal, warm to touch, Capillary refill delayed  Pulses present bilaterally   Assessment & Plan:  Acute on chronic hypoxic hypercapnic respiratory failure secondary  To SEVERE AECOPD and acute DECOMPENSATED HfPEF CHF exacerbation WITH EF >55%   Severe ACUTE Hypoxic and Hypercapnic Respiratory Failure -continue Mechanical Ventilator support -Wean Fio2 and PEEP as tolerated -VAP/VENT bundle implementation - Wean PEEP & FiO2 as tolerated, maintain SpO2 > 88% - Head of bed elevated 30 degrees, VAP protocol in place - Plateau pressures less than 30 cm H20  - Intermittent chest x-ray & ABG PRN - Ensure adequate pulmonary hygiene  Unable to wean from vent today  SEVERE COPD EXACERBATION -continue IV steroids as prescribed -continue NEB THERAPY as prescribed -wean fio2 as needed and tolerated  Elevated Troponin secondary to demand ischemia  Acute on chronic diastolic systolic CHF  - Trend troponins  - consider systemic anticoagulation as troponin trend suggests - continuous cardiac monitoring  Prn levophed gtt to maintain map >65  - Hold outpatient antihypertensives and beta-blockers for now  - Continue aspirin and atorvastatin  - Cardiology consulted appreciate input   RENAL -continue Foley Catheter-assess need -Avoid nephrotoxic agents -Follow urine output, BMP -Ensure adequate renal perfusion, optimize oxygenation -Renal dose medications   Intake/Output Summary (Last 24 hours) at 01/14/2023 0724 Last data filed at  01/14/2023 0409 Gross per 24 hour  Intake 2556.31 ml  Output 1950 ml  Net 606.31 ml       Latest Ref Rng & Units 01/14/2023    4:44 AM 01/12/2023    4:48 AM 01/11/2023    5:00 AM  BMP  Glucose 70 - 99 mg/dL 191  478  295   BUN 8 - 23 mg/dL 61  59  53   Creatinine 0.61 - 1.24 mg/dL 6.21  3.08  6.57   Sodium 135 - 145 mmol/L 145  141  138   Potassium 3.5 - 5.1 mmol/L 5.1  4.8  4.6   Chloride 98 - 111 mmol/L 108  106  104   CO2 22 - 32 mmol/L 30  30  27    Calcium 8.9 - 10.3 mg/dL 8.5  8.8  8.7     UTI secondary to Ecoli  - Trend WBC and monitor fever curve  - Trend PCT  - Continue ceftriaxone    NEUROLOGY ACUTE METABOLIC ENCEPHALOPATHY -need for sedation -Goal RASS -2 to -3   ENDO - ICU hypoglycemic\Hyperglycemia protocol -check FSBS per protocol   GI GI PROPHYLAXIS as indicated  NUTRITIONAL STATUS DIET-->TF's as tolerated Constipation protocol as indicated   ELECTROLYTES -follow labs as needed -replace as needed -pharmacy consultation and following  Best Practice (right click and "Reselect all SmartList Selections" daily)   Diet/type: NPO DVT prophylaxis: LMWH GI prophylaxis: H2B Lines: N/A Foley:  N/A Code Status:  full code Last date of multidisciplinary goals of care discussion [N/A]  05/15: Attempted to contact pts friend Lalla Brothers utilizing phone number listed in epic, however the phone number listed in epic is the wrong number.  No other family contact information available in epic.  5/20 palliative care team consulted Labs   CBC: Recent Labs  Lab 01/09/23 0507 01/10/23 0319 01/11/23 0500 01/12/23 0448 01/14/23 0444  WBC 8.6 17.5* 15.5* 12.2* 10.5  HGB 12.8* 14.7 12.7* 12.6* 12.8*  HCT 40.5 44.6 39.7 40.2 42.0  MCV 90.6 87.5 89.8 91.6 94.0  PLT 210 229 226 251 281     Basic Metabolic Panel: Recent Labs  Lab 01/08/23 1457 01/09/23 0507 01/09/23 1611 01/09/23 2051 01/10/23 0319 01/11/23 0500 01/12/23 0448 01/14/23 0444   NA  --  137  --   --  136 138 141 145  K  --  3.2* 3.3* 3.3* 3.1* 4.6 4.8 5.1  CL  --  91*  --   --  95* 104 106 108  CO2  --  34*  --   --  29 27 30 30   GLUCOSE  --  173*  --   --  251* 239* 221* 153*  BUN  --  27*  --   --  28* 53* 59* 61*  CREATININE  --  1.19  --   --  1.01 1.09 1.06 0.98  CALCIUM  --  8.7*  --   --  9.1 8.7* 8.8* 8.5*  MG 2.3 2.2 2.5*  --  2.7*  --   --  3.2*  PHOS 2.1* 1.5* 5.1* 3.9 2.7  --   --   --     GFR: Estimated Creatinine Clearance: 112.2 mL/min (by C-G formula based on SCr of 0.98 mg/dL). Recent Labs  Lab 01/08/23 1457 01/09/23 0507 01/10/23 0319 01/11/23 0500 01/12/23 0448 01/14/23 0444  WBC  --    < > 17.5* 15.5* 12.2* 10.5  LATICACIDVEN 1.7  --   --   --   --   --    < > = values in this interval not displayed.     Liver Function Tests: Recent Labs  Lab 01/09/23 2051  AST 36  ALT 23  ALKPHOS 52  BILITOT 1.0  PROT 7.1  ALBUMIN 3.6    No results for input(s): "LIPASE", "AMYLASE" in the last 168 hours. Recent Labs  Lab 01/07/23 2256  AMMONIA 45*     ABG    Component Value Date/Time   PHART 7.44 01/09/2023 1517   PCO2ART 54 (H) 01/09/2023 1517   PO2ART 62 (L) 01/09/2023 1517   HCO3 36.7 (H) 01/09/2023 1517   O2SAT 92.4 01/09/2023 1517     Coagulation Profile: Recent Labs  Lab 01/07/23 2306  INR 1.0     Cardiac Enzymes: Recent Labs  Lab 01/08/23 1450  CKTOTAL 917*     HbA1C: Hemoglobin A1C  Date/Time Value Ref Range Status  08/12/2013 04:01 AM 6.9 (H) 4.2 - 6.3 % Final    Comment:    The American Diabetes Association recommends that a primary goal of therapy should be <7% and that physicians should reevaluate the treatment regimen in patients with HbA1c values consistently >8%.    Hgb A1c MFr Bld  Date/Time Value Ref Range Status  01/08/2023 02:57 PM 9.4 (H)  4.8 - 5.6 % Final    Comment:    (NOTE) Pre diabetes:          5.7%-6.4%  Diabetes:              >6.4%  Glycemic control for    <7.0% adults with diabetes   11/10/2022 12:36 AM 11.2 (H) 4.8 - 5.6 % Final    Comment:    (NOTE)         Prediabetes: 5.7 - 6.4         Diabetes: >6.4         Glycemic control for adults with diabetes: <7.0     CBG: Recent Labs  Lab 01/13/23 1132 01/13/23 1609 01/13/23 1908 01/13/23 2312 01/14/23 0316  GLUCAP 193* 238* 318* 296* 179*      DVT/GI PRX  assessed I Assessed the need for Labs I Assessed the need for Foley I Assessed the need for Central Venous Line Family Discussion when available I Assessed the need for Mobilization I made an Assessment of medications to be adjusted accordingly Safety Risk assessment completed  CASE DISCUSSED IN MULTIDISCIPLINARY ROUNDS WITH ICU TEAM     Critical Care Time devoted to patient care services described in this note is 50 minutes.  Critical care was necessary to treat /prevent imminent and life-threatening deterioration. Overall, patient is critically ill, prognosis is guarded.  Patient with Multiorgan failure and at high risk for cardiac arrest and death.    Lucie Leather, M.D.  [facility]  Medical Director [facility] Medical Director [facility] Cardio-Pulmonary Department

## 2023-01-15 ENCOUNTER — Inpatient Hospital Stay: Payer: 59

## 2023-01-15 DIAGNOSIS — R0603 Acute respiratory distress: Secondary | ICD-10-CM | POA: Diagnosis not present

## 2023-01-15 DIAGNOSIS — R079 Chest pain, unspecified: Secondary | ICD-10-CM | POA: Diagnosis not present

## 2023-01-15 DIAGNOSIS — I214 Non-ST elevation (NSTEMI) myocardial infarction: Secondary | ICD-10-CM | POA: Diagnosis not present

## 2023-01-15 LAB — BASIC METABOLIC PANEL
Anion gap: 7 (ref 5–15)
BUN: 52 mg/dL — ABNORMAL HIGH (ref 8–23)
CO2: 30 mmol/L (ref 22–32)
Calcium: 9 mg/dL (ref 8.9–10.3)
Chloride: 114 mmol/L — ABNORMAL HIGH (ref 98–111)
Creatinine, Ser: 0.99 mg/dL (ref 0.61–1.24)
GFR, Estimated: >60 mL/min (ref >60)
Glucose, Bld: 202 mg/dL — ABNORMAL HIGH (ref 70–99)
Potassium: 5 mmol/L (ref 3.5–5.1)
Sodium: 151 mmol/L — ABNORMAL HIGH (ref 135–145)

## 2023-01-15 LAB — GLUCOSE, CAPILLARY
Glucose-Capillary: 195 mg/dL — ABNORMAL HIGH (ref 70–99)
Glucose-Capillary: 175 mg/dL — ABNORMAL HIGH (ref 70–99)
Glucose-Capillary: 200 mg/dL — ABNORMAL HIGH (ref 70–99)
Glucose-Capillary: 187 mg/dL — ABNORMAL HIGH (ref 70–99)
Glucose-Capillary: 208 mg/dL — ABNORMAL HIGH (ref 70–99)
Glucose-Capillary: 168 mg/dL — ABNORMAL HIGH (ref 70–99)

## 2023-01-15 LAB — CBC
HCT: 43 % (ref 39.0–52.0)
Hemoglobin: 13 g/dL (ref 13.0–17.0)
MCH: 28.6 pg (ref 26.0–34.0)
MCHC: 30.2 g/dL (ref 30.0–36.0)
MCV: 94.5 fL (ref 80.0–100.0)
Platelets: 341 10*3/uL (ref 150–400)
RBC: 4.55 MIL/uL (ref 4.22–5.81)
RDW: 14.4 % (ref 11.5–15.5)
WBC: 16.4 10*3/uL — ABNORMAL HIGH (ref 4.0–10.5)
nRBC: 0.1 % (ref 0.0–0.2)

## 2023-01-15 LAB — MAGNESIUM: Magnesium: 2.7 mg/dL — ABNORMAL HIGH (ref 1.7–2.4)

## 2023-01-15 LAB — CULTURE, RESPIRATORY W GRAM STAIN: Culture: NO GROWTH

## 2023-01-15 MED ORDER — FREE WATER
200.0000 mL | Status: DC
  Administered 2023-01-15 – 2023-01-16 (×4): 200 mL

## 2023-01-15 MED ORDER — ADULT MULTIVITAMIN W/MINERALS CH: 1.0000 | ORAL_TABLET | Freq: Every day | ORAL | Status: DC

## 2023-01-15 NOTE — Progress Notes (Signed)
Nutrition Follow-up  DOCUMENTATION CODES:   Morbid obesity  INTERVENTION:   -Continue TF via OGT:   Vital 1.5 @ 40 ml/hr   60 ml Prosource Plus 5 times daily  30 ml free water flush every 4 hours to maintain tube patency  Tube feeding regimen provides 1840 kcal (100% of needs), 165 grams of protein, and 733 ml of H2O. Total free water: 913 ml daily  -MVI with minerals daily via tube  -Continue daily weights  NUTRITION DIAGNOSIS:   Inadequate oral intake related to inability to eat (pt sedated and ventilated) as evidenced by NPO status.  Ongoing  GOAL:   Provide needs based on ASPEN/SCCM guidelines  Met with TF  MONITOR:   Vent status, Labs, Weight trends, TF tolerance, I & O's, Skin  REASON FOR ASSESSMENT:   Ventilator    ASSESSMENT:   67 y/o male with h/o COPD, CHF, HTN, CAD, HTN, MI, substance abuse and DM who is admitted with possible NSTEMI, CHF exacerbation, UTI, AKI, rhabdomyolysis and AMS.  Patient is currently intubated on ventilator support. 18 french OGT in place; KUB on 01/08/23 reveals tip of tube in stomach.  MV: 12 L/min Temp (24hrs), Avg:100.2 F (37.9 C), Min:98.7 F (37.1 C), Max:102.4 F (39.1 C)  Propofol: 12.5 ml/hr (provides 300 kcals daily)  Reviewed I/O's: -321 ml x 24 hours and -3.4 L since admission  UOP: 3 L x 24 hours   Case discussed with RN, MD, and during ICU rounds. Pt has failed multiple SBT's. Pt with spontaneous movement on propofol and fentanyl.   Per MD notes, plan for palliative care consult in attempt to contact decision maker. Pt likely needs trach and PEG. Per palliative care notes, DSS has been contacted to start guardianship process.   Per RN, TF was turned off this morning secondary to high temperature. This has been improved with tylenol. MD has given order to resume TF.   Noted 20# wt gain over the past week. Pt -3.4 L since admission.   Medications reviewed and include colace, pepcid, solu-medrol, and  levophed.   Labs reviewed: Na: 151, CBGS: 175-200 (inpatient orders for glycemic control are 0-20 units every 4 hours, 10 units insulin aspart every 4 hours, and 30 units insulin detemir BID).    Diet Order:   Diet Order     None       EDUCATION NEEDS:   No education needs have been identified at this time  Skin:  Skin Assessment: Reviewed RN Assessment  Last BM:  01/15/23 (type 6)  Height:   Ht Readings from Last 1 Encounters:  01/05/23 6\' 1"  (1.854 m)    Weight:   Wt Readings from Last 1 Encounters:  01/15/23 (!) 148.4 kg    Ideal Body Weight:  83.6 kg  BMI:  Body mass index is 43.16 kg/m.  Estimated Nutritional Needs:   Kcal:  1540-1960kcal/day  Protein:  >165g/day  Fluid:  2.0L/day    Levada Schilling, RD, LDN, CDCES Registered Dietitian II Certified Diabetes Care and Education Specialist Please refer to AMION for RD and/or RD on-call/weekend/after hours pager

## 2023-01-15 NOTE — Inpatient Diabetes Management (Addendum)
Inpatient Diabetes Program Recommendations  AACE/ADA: New Consensus Statement on Inpatient Glycemic Control (2015)  Target Ranges:  Prepandial:   less than 140 mg/dL      Peak postprandial:   less than 180 mg/dL (1-2 hours)      Critically ill patients:  140 - 180 mg/dL    Latest Reference Range & Units 01/13/23 23:12 01/14/23 03:16 01/14/23 07:18 01/14/23 11:53 01/14/23 15:40 01/14/23 19:12  Glucose-Capillary 70 - 99 mg/dL 161 (H)  21 units Novolog  179 (H)  14 units Novolog  129 (H)  13 units Novolog  190 (H)  14 units Novolog  30 units Levemir @1033   255 (H)  21 units Novolog  226 (H)  17 units Novolog  30 units Levemir @2104    (H): Data is abnormally high  Latest Reference Range & Units 01/14/23 23:27 01/15/23 03:13 01/15/23 07:35  Glucose-Capillary 70 - 99 mg/dL 096 (H)  17 units Novolog  195 (H)  14 units Novolog  175 (H)  (H): Data is abnormally high     Home DM Meds: Lantus 40 units BID        Metformin 1000 mg daily        Humalog 10 units TID   Current Orders: Levemir 30 units BID      Novolog Resistant Correction Scale/ SSI (0-20 units) Q4 hours      Novolog 10 units Q4 hours     Note pt getting Solumedrol 40 mg daily  Tube Feeds running 40cc/hr   MD- CBGs are consistently elevated in the evening hours possibly due to the Solumedrol  If pt to remain on the Solumedrol, may consider increasing the AM dose of Levemir to 35 units   Would leave the PM dose of Levemir at 30 units as CBGs from 3am to 12pm are at [facility] levels     --Will follow patient during hospitalization--  Ambrose Finland RN, MSN, CDCES Diabetes Coordinator Inpatient Glycemic Control Team Team Pager: [phone] (8a-5p)

## 2023-01-15 NOTE — Progress Notes (Signed)
PHARMACY CONSULT NOTE - FOLLOW UP  Pharmacy Consult for Electrolyte Monitoring and Replacement   Recent Labs: Potassium (mmol/L)  Date Value  01/15/2023 5.0  08/20/2013 3.7   Magnesium (mg/dL)  Date Value  16/05/9603 2.7 (H)   Calcium (mg/dL)  Date Value  54/04/8118 9.0   Calcium, Total (mg/dL)  Date Value  14/78/2956 9.1   Albumin (g/dL)  Date Value  21/30/8657 3.6  08/20/2013 3.7   Phosphorus (mg/dL)  Date Value  84/69/6295 2.7   Sodium (mmol/L)  Date Value  01/15/2023 151 (H)  08/20/2013 134 (L)     Assessment: 67 y/o male with h/o COPD, CHF, HTN, CAD, HTN, MI, substance abuse and DM who is admitted with possible NSTEMI, CHF exacerbation, UTI, AKI, rhabdomyolysis and AMS   Nutrition: Vital AF at 40 mL/hr + FWF 30mL q4h  Goal of Therapy:  Electrolytes WNL  Plan:  ---increase free water flushes to 200 mL every 4 hours ---recheck electrolytes in am  Lowella Bandy ,PharmD Clinical Pharmacist 01/15/2023 1:06 PM

## 2023-01-15 NOTE — Progress Notes (Signed)
SUBJECTIVE:     Patient is a 67 y.o. 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  Patient states he had not been feeling well for a few days. On Sunday. he drove out of town with a friend, heavily exerted himself while walking around outside. Patient proceeded to drive himself home when he began to feel chest discomfort in the center of his chest, short of breath, diaphoretic, loss of bladder and bowel control. After patient arrived at home, EMS was called to bring him to the ED.    Patient is established with [facility] cardiology, is non-compliant with follow up outpatient appointments.    Overnight, patient developed acute encephalopathy with concern for hypercapnia, acute respiratory failure requiring BiPAP, hyperammonemia. Troponin levels repeated, 71>75. Repeat EKG, in sinus rhythm, no acute changes compared to EKG on arrival to ED.    Rapid response called on 01/08/23, patient developed acute encephalopathy and acute hypercapnic hypoxic respiratory failure requiring BiPAP. Patient failed BiPAP, developed jerking movements concerning for myoclonus. Patient was subsequently transferred to the ICU where he was mechanically intubated.    01/09/23 EKG performed for ST elevation on monitor. EKG showed no significant ST elevation inferolaterally. Troponin levels repeated, 84>78>65>62.    Attempt made to wean patient from the vent on 01/12/23 and 01/13/23, severe hypoxia, failure to wean.  No further attempt to wean patient from vent until medical decision maker determined.   Intake/Output Summary (Last 24 hours) at 01/15/2023 0907 Last data filed at 01/15/2023 0407 Gross per 24 hour  Intake 2483.26 ml  Output 2975 ml  Net  -491.74 ml    LABS: Basic Metabolic Panel: Recent Labs    01/14/23 0444 01/15/23 0658  NA 145 151*  K 5.1 5.0  CL 108 114*  CO2 30 30  GLUCOSE 153* 202*  BUN 61* 52*  CREATININE 0.98 0.99  CALCIUM 8.5* 9.0  MG 3.2* 2.7*   Liver Function Tests: No results for input(s): "AST", "ALT", "ALKPHOS", "BILITOT", "PROT", "ALBUMIN" in the last 72 hours. No results for input(s): "LIPASE", "AMYLASE" in the last 72 hours. CBC: Recent Labs    01/14/23 0444 01/15/23 0658  WBC 10.5 16.4*  HGB 12.8* 13.0  HCT 42.0 43.0  MCV 94.0 94.5  PLT 281 341   Cardiac Enzymes: No results for input(s): "CKTOTAL", "CKMB", "CKMBINDEX", "TROPONINI" in the last 72 hours. BNP: Invalid input(s): "POCBNP" D-Dimer: No results for input(s): "DDIMER" in the last 72 hours. Hemoglobin A1C: No results for input(s): "HGBA1C" in the last 72 hours. Fasting Lipid Panel: Recent Labs    01/14/23 0444  TRIG 131   Thyroid Function Tests: No results for input(s): "TSH", "T4TOTAL", "T3FREE", "THYROIDAB" in the last 72 hours.  Invalid input(s): "FREET3" Anemia Panel: No results for input(s): "VITAMINB12", "FOLATE", "FERRITIN", "TIBC", "IRON", "RETICCTPCT" in the last 72 hours.   PHYSICAL EXAM General: acutely ill appearing, mechanically intubated  HEENT:  Normocephalic and atramatic Neck:  No JVD.  Lungs: Clear bilaterally to auscultation, even, non-labored Heart: HRRR . Normal S1 and S2 without gallops or murmurs.  Abdomen: Bowel sounds are positive, abdomen soft and non-tender  Extremities: No clubbing, cyanosis or edema.   Neuro: sedated Psych:  sedated  TELEMETRY: sinus rhythm,  HR 76 bpm  ASSESSMENT AND PLAN: Patient is a 67 y.o. male with medical history significant for combined CHF with recovered EF (EF 65% G1 DD 07/22/2022), COPD on home O2 at 4L, CAD s/p stent x1, HTN, T2DM , class III obesity, OSA who presented to the ED on 01/05/23 for chest discomfort and shortness of breath. Echo 01/07/23  revealed EF 65-70%, severe LVH, normal wall motion, grade II DD. Troponin levels continued to be flat. Consider further cardiac workup possibly cardiac cath when the patient has been extubated. Patient remains critically ill.  Respiratory distress likely from COPD exacerbation. Will continue to follow.    ICD-10-CM   1. Respiratory distress  R06.03     2. Congestive heart failure, unspecified HF chronicity, unspecified heart failure type (HCC)  I50.9     3. Lower urinary tract infection  N39.0       Principal Problem:   NSTEMI (non-ST elevated myocardial infarction) (HCC) Active Problems:   Chronic obstructive pulmonary disease (COPD) (HCC)   Type 2 diabetes mellitus with hyperglycemia, with long-term current use of insulin (HCC)   Essential hypertension   Obesity, Class III, BMI 40-49.9 (morbid obesity) (HCC)   Acute on chronic respiratory failure with hypoxia (HCC)   Acute on chronic diastolic CHF (congestive heart failure) (HCC)   OSA (obstructive sleep apnea)   Benign prostatic hyperplasia with urinary hesitancy   Urinary tract infection   Hypokalemia   History of kidney stones    Jakyia Gaccione, FNP-C 01/15/2023 9:07 AM

## 2023-01-15 NOTE — Progress Notes (Signed)
NAME:  Cody Alexander, MRN:  [id number], DOB:  [birth year], LOS: 10 ADMISSION DATE:  01/05/2023, CONSULTATION DATE: 01/08/2023 REFERRING MD: Dr. Georgeann Oppenheim, CHIEF COMPLAINT: AMS    History of Present Illness:  This is a 67 yo male with a PMH of COPD and Chronic Systolic Diastolic CHF.  He presented to [facility] ER on 05/12 from home via EMS with c/o shortness of breath onset 05/12.  Per ER notes EMS reported pt wears 4L O2 chronically, however he has been noncompliant with his oxygen.  When EMS arrival at pts home he was noted to be diaphoretic with initial O2 sats @86 % on 4L O2.    ED Course  Upon arrival to the ER pt alert/oriented but remained diaphoretic/tachypneic requiring 15L NRB with resolution of hypoxia.  Pt placed on Bipap.  Pt reported he had some lower extremity swelling and inability to void.  CXR concerning for mild interstitial edema.  CT Renal stone study revealed no nephrolithiasis, ureterolithiasis, or obstructive uropathy but a small amount of gas present in the bladder presumably related to procedure and small bladder diverticula.  CTA Chest negative for PE or acute pulmonary parenchymal findings.  UA concerning for possible UTI.  Pt received cefepime.  He was subsequently admitted to the progressive care unit for additional workup and treatment.  See detailed hospital course below under significant events   Pertinent  Medical History  COPD Chronic O2 @4L   Type II Diabetes Mellitus  Essential HTN  Neuropathy  Chronic Systolic Diastolic CHF  OSA  MI  Obesity  Substance Abuse   Significant Hospital Events: Including procedures, antibiotic start and stop dates in addition to other pertinent events   05/13: Pt admitted to the progressive care unit with acute on chronic hypoxic respiratory failure secondary to acute on chronic CHF exacerbation, UTI, and NSTEMI  05/15: Pt developed acute encephalopathy and acute hypercapnic hypoxic respiratory failure overnight requiring portable  Bipap.  However, pt failed Bipap developed jerking movements concerning for myoclonus requiring transfer to ICU. PCCM team consulted and pt mechanically intubated  01/09/23- patient had abnormal EKG today but repeat cardiac biomarkers seemed to be normal. He is on MV today he is not ready for SBT.  We did deliver diamox today to help with acid base disorder.  01/10/23- for SBT today, on insulin gtt, electrolytes are improved. Reducing steroids today.  01/11/23- failed SBT today. >100k colonies of ecoli on Urine culture. BP is low today , have dcd aldactone and flomax. Continuing rocephin, unable to liberate from MV today. 01/12/23- PRVC weaned to 50%, secretions improved.  5/20 severe hypoxia, failure to wean from vent 5/21 severe COPD, hypoxia-failure to wean from vent 5/22 severe COPD, severe Hypoxia  Interim History / Subjective:  Remains intubated Remains on vent Severe Hypoxia Remains critically ill Patient will likely need TRACH and PEG tube for survival Palliative care team consulted  Vent Mode: PRVC FiO2 (%):  [65 %-70 %] 65 % Set Rate:  [22 bmp] 22 bmp Vt Set:  [550 mL] 550 mL PEEP:  [10 cmH20] 10 cmH20   Objective   Blood pressure (!) 105/47, pulse 75, temperature 99.6 F (37.6 C), temperature source Axillary, resp. rate (!) 22, height 6\' 1"  (1.854 m), weight (!) 148.4 kg, SpO2 95 %.    Vent Mode: PRVC FiO2 (%):  [65 %-70 %] 65 % Set Rate:  [22 bmp] 22 bmp Vt Set:  [550 mL] 550 mL PEEP:  [10 cmH20] 10 cmH20   Intake/Output Summary (Last 24  hours) at 01/15/2023 2130 Last data filed at 01/15/2023 0407 Gross per 24 hour  Intake 2623.46 ml  Output 2975 ml  Net -351.54 ml    Filed Weights   01/13/23 0500 01/14/23 0400 01/15/23 0500  Weight: (!) 140.2 kg (!) 147.6 kg (!) 148.4 kg       REVIEW OF SYSTEMS  PATIENT IS UNABLE TO PROVIDE COMPLETE REVIEW OF SYSTEMS DUE TO SEVERE CRITICAL ILLNESS   PHYSICAL EXAMINATION:  GENERAL:critically ill appearing, +resp  distress EYES: Pupils equal, round, reactive to light.  No scleral icterus.  MOUTH: Moist mucosal membrane. INTUBATED NECK: Supple.  PULMONARY: Lungs clear to auscultation, +rhonchi, +wheezing CARDIOVASCULAR: S1 and S2.  Regular rate and rhythm GASTROINTESTINAL: Soft, nontender, -distended. Positive bowel sounds.  MUSCULOSKELETAL: No swelling, clubbing, or edema.  NEUROLOGIC: obtunded,sedated SKIN:normal, warm to touch, Capillary refill delayed  Pulses present bilaterally  Assessment & Plan:  Acute on chronic hypoxic hypercapnic respiratory failure secondary  To SEVERE AECOPD and acute DECOMPENSATED HfPEF CHF exacerbation WITH EF >55%   Severe ACUTE Hypoxic and Hypercapnic Respiratory Failure -continue Mechanical Ventilator support -Wean Fio2 and PEEP as tolerated -VAP/VENT bundle implementation - Wean PEEP & FiO2 as tolerated, maintain SpO2 > 88% - Head of bed elevated 30 degrees, VAP protocol in place - Plateau pressures less than 30 cm H20  - Intermittent chest x-ray & ABG PRN - Ensure adequate pulmonary hygiene  Unable to wean from vent today  SEVERE COPD EXACERBATION -continue IV steroids as prescribed -continue NEB THERAPY as prescribed -wean fio2 as needed and tolerated  Elevated Troponin secondary to demand ischemia  Acute on chronic diastolic systolic CHF  - Trend troponins  - consider systemic anticoagulation as troponin trend suggests - continuous cardiac monitoring  Prn levophed gtt to maintain map >65  - Hold outpatient antihypertensives and beta-blockers for now  - Continue aspirin and atorvastatin  - Cardiology consulted appreciate input   RENAL -continue Foley Catheter-assess need -Avoid nephrotoxic agents -Follow urine output, BMP -Ensure adequate renal perfusion, optimize oxygenation -Renal dose medications   Intake/Output Summary (Last 24 hours) at 01/15/2023 0730 Last data filed at 01/15/2023 0407 Gross per 24 hour  Intake 2623.46 ml  Output  2975 ml  Net -351.54 ml       Latest Ref Rng & Units 01/14/2023    4:44 AM 01/12/2023    4:48 AM 01/11/2023    5:00 AM  BMP  Glucose 70 - 99 mg/dL 865  784  696   BUN 8 - 23 mg/dL 61  59  53   Creatinine 0.61 - 1.24 mg/dL 2.95  2.84  1.32   Sodium 135 - 145 mmol/L 145  141  138   Potassium 3.5 - 5.1 mmol/L 5.1  4.8  4.6   Chloride 98 - 111 mmol/L 108  106  104   CO2 22 - 32 mmol/L 30  30  27    Calcium 8.9 - 10.3 mg/dL 8.5  8.8  8.7     UTI secondary to [facility] therapy for pneumonia Cefepime and VANC now - Trend WBC and monitor fever curve  - Trend PCT    NEUROLOGY ACUTE METABOLIC ENCEPHALOPATHY -need for sedation -Goal RASS -2 to -3   ENDO - ICU hypoglycemic\Hyperglycemia protocol -check FSBS per protocol   GI GI PROPHYLAXIS as indicated  NUTRITIONAL STATUS DIET-->TF's as tolerated Constipation protocol as indicated   ELECTROLYTES -follow labs as needed -replace as needed -pharmacy consultation and following  Best Practice (right click and "Reselect all  SmartList Selections" daily)   Diet/type: NPO DVT prophylaxis: LMWH GI prophylaxis: H2B Lines: N/A Foley:  N/A Code Status:  full code Last date of multidisciplinary goals of care discussion [N/A]  05/15: Attempted to contact pts friend Lalla Brothers utilizing phone number listed in epic, however the phone number listed in epic is the wrong number.  No other family contact information available in epic.  5/20 palliative care team consulted Labs   CBC: Recent Labs  Lab 01/10/23 0319 01/11/23 0500 01/12/23 0448 01/14/23 0444 01/15/23 0658  WBC 17.5* 15.5* 12.2* 10.5 16.4*  HGB 14.7 12.7* 12.6* 12.8* 13.0  HCT 44.6 39.7 40.2 42.0 43.0  MCV 87.5 89.8 91.6 94.0 94.5  PLT 229 226 251 281 341     Basic Metabolic Panel: Recent Labs  Lab 01/08/23 1457 01/09/23 0507 01/09/23 1611 01/09/23 2051 01/10/23 0319 01/11/23 0500 01/12/23 0448 01/14/23 0444  NA  --  137  --   --  136 138 141 145  K   --  3.2* 3.3* 3.3* 3.1* 4.6 4.8 5.1  CL  --  91*  --   --  95* 104 106 108  CO2  --  34*  --   --  29 27 30 30   GLUCOSE  --  173*  --   --  251* 239* 221* 153*  BUN  --  27*  --   --  28* 53* 59* 61*  CREATININE  --  1.19  --   --  1.01 1.09 1.06 0.98  CALCIUM  --  8.7*  --   --  9.1 8.7* 8.8* 8.5*  MG 2.3 2.2 2.5*  --  2.7*  --   --  3.2*  PHOS 2.1* 1.5* 5.1* 3.9 2.7  --   --   --     GFR: Estimated Creatinine Clearance: 112.5 mL/min (by C-G formula based on SCr of 0.98 mg/dL). Recent Labs  Lab 01/08/23 1457 01/09/23 0507 01/11/23 0500 01/12/23 0448 01/14/23 0444 01/15/23 0658  WBC  --    < > 15.5* 12.2* 10.5 16.4*  LATICACIDVEN 1.7  --   --   --   --   --    < > = values in this interval not displayed.     Liver Function Tests: Recent Labs  Lab 01/09/23 2051  AST 36  ALT 23  ALKPHOS 52  BILITOT 1.0  PROT 7.1  ALBUMIN 3.6    No results for input(s): "LIPASE", "AMYLASE" in the last 168 hours. No results for input(s): "AMMONIA" in the last 168 hours.   ABG    Component Value Date/Time   PHART 7.44 01/09/2023 1517   PCO2ART 54 (H) 01/09/2023 1517   PO2ART 62 (L) 01/09/2023 1517   HCO3 36.7 (H) 01/09/2023 1517   O2SAT 92.4 01/09/2023 1517     Coagulation Profile: No results for input(s): "INR", "PROTIME" in the last 168 hours.   Cardiac Enzymes: Recent Labs  Lab 01/08/23 1450  CKTOTAL 917*     HbA1C: Hemoglobin A1C  Date/Time Value Ref Range Status  08/12/2013 04:01 AM 6.9 (H) 4.2 - 6.3 % Final    Comment:    The American Diabetes Association recommends that a primary goal of therapy should be <7% and that physicians should reevaluate the treatment regimen in patients with HbA1c values consistently >8%.    Hgb A1c MFr Bld  Date/Time Value Ref Range Status  01/08/2023 02:57 PM 9.4 (H) 4.8 - 5.6 % Final  Comment:    (NOTE) Pre diabetes:          5.7%-6.4%  Diabetes:              >6.4%  Glycemic control for   <7.0% adults with  diabetes   11/10/2022 12:36 AM 11.2 (H) 4.8 - 5.6 % Final    Comment:    (NOTE)         Prediabetes: 5.7 - 6.4         Diabetes: >6.4         Glycemic control for adults with diabetes: <7.0     CBG: Recent Labs  Lab 01/14/23 1153 01/14/23 1540 01/14/23 1912 01/14/23 2327 01/15/23 0313  GLUCAP 190* 255* 226* 246* 195*       DVT/GI PRX  assessed I Assessed the need for Labs I Assessed the need for Foley I Assessed the need for Central Venous Line Family Discussion when available I Assessed the need for Mobilization I made an Assessment of medications to be adjusted accordingly Safety Risk assessment completed  CASE DISCUSSED IN MULTIDISCIPLINARY ROUNDS WITH ICU TEAM     Critical Care Time devoted to patient care services described in this note is 55 minutes.  Critical care was necessary to treat /prevent imminent and life-threatening deterioration. Overall, patient is critically ill, prognosis is guarded.  Patient with Multiorgan failure and at high risk for cardiac arrest and death.    Lucie Leather, M.D.  [facility]  Medical Director [facility] Medical Director [facility] Cardio-Pulmonary Department

## 2023-01-15 NOTE — Plan of Care (Signed)
GOC consult noted. As per notes, patient does not currently have a surrogate decision maker, and DSS has been contacted. Please reconsult PMT if needed when a surrogate decision maker has been established.

## 2023-01-15 NOTE — TOC Progression Note (Signed)
Transition of Care [facility]) - Progression Note    Patient Details  Name: Cody Alexander MRN: [id number] Date of Birth: 1956/05/29  Transition of Care [facility]) CM/SW Contact  Darolyn Rua, [place] Phone Number: 01/15/2023, 12:09 PM  Clinical Narrative:     CSW has not received call from patient's daughter, patient's cousin Marylene Land has been updated that DSS will be called to start guardianship process due to lack of communication/no communication from family. No family/friends agreeable ot be decision maker.   CSW has made APS report, CSW has reached out to DSS Guardianship Supervisor Ellin Saba to start guardianship process.      Barriers to Discharge: Continued Medical Work up  Expected Discharge Plan and Services       Living arrangements for the past 2 months: Single Family Home                                       Social Determinants of Health (SDOH) Interventions SDOH Screenings   Food Insecurity: No Food Insecurity (11/10/2022)  Housing: Low Risk  (11/10/2022)  Transportation Needs: No Transportation Needs (11/10/2022)  Utilities: Not At Risk (11/10/2022)  Tobacco Use: Medium Risk (01/05/2023)    Readmission Risk Interventions    06/25/2022   10:13 AM  Readmission Risk Prevention Plan  Transportation Screening Complete  PCP or Specialist Appt within 3-5 Days Complete  HRI or Home Care Consult Complete  Social Work Consult for Recovery Care Planning/Counseling Complete  Palliative Care Screening Not Applicable  Medication Review Oceanographer) Complete

## 2023-01-15 NOTE — Progress Notes (Signed)
RN called RT to patient bedside due to cuff leak. Upon arrival patient had significant audible cuffleak. ETT was dislodged from 25cm to 24cm during linen change. ETT advanced back to 25cm, audible leak still present, therefore ETT adanvced further. ETT secured at 27cm with no leak present. Patient maintaining SATs and proper tidal volumes, bilateral breath sounds noted, RN and NP made aware. STAT chest xray ordered.

## 2023-01-15 NOTE — Progress Notes (Addendum)
Busy day. 0800 Bilateral mitts removed. Patient very calm. Refused to open eyes for examine. Copious amounts of sputum coughed into ETT.  Had BM. Cleaned.Oral temperature 102.4. treated with Tylenol 650 down his OG. 1000 Had BM. Turned and pooped again. Patient still sedated Moved arms to simple commands and spontaneously. 1200 Bath done. Had large BM. Cleaned and stooled again. Cleaned again. Weaning Levophed as tolerated. 1400 Copious amounts of sputum and nasal drainage noted with each patient turn. Nasal drainage clear,sputum thick and gray. Sputum sent yesterday for culture.Had another BM. 1600 Cleaned of BM .Bottom sheet also changed. Stool still too thick for flexiseal. 1800 Patient combative and attempting to extubate self. Mitts placed on hands. Remained combative. Given Fentanyl bolus of 100 mcg, from bag and 2 mg of Versed IVP.1815 Resting quietly. FIO2 weaned to 50% and PEEP weaned to 8.No communication from any family.

## 2023-01-15 NOTE — TOC Progression Note (Signed)
Transition of Care [facility]) - Progression Note    Patient Details  Name: Cody Alexander MRN: [id number] Date of Birth: 10/07/1955  [facility]) CM/SW Contact  Cody Alexander, [place] Phone Number: 01/15/2023, 9:03 AM  Clinical Narrative:     Patient's cousin Cody Alexander reached out ot this CSW to see if patient's daughter has contacted this CSW. No contact as of yet, Cody Alexander updated. She reports she will try again to message daughter and have her call me.   CSW informed Cody Alexander if no contact of daughter by noon today, DSS will be contacted to discuss case and decide if they wish to pursue guardianship or if Cody Alexander is able to as cousin.       Barriers to Discharge: Continued Medical Work up  Expected Discharge Plan and Services       Living arrangements for the past 2 months: Single Family Home                                       Social Determinants of Health (SDOH) Interventions SDOH Screenings   Food Insecurity: No Food Insecurity (11/10/2022)  Housing: Low Risk  (11/10/2022)  Transportation Needs: No Transportation Needs (11/10/2022)  Utilities: Not At Risk (11/10/2022)  Tobacco Use: Medium Risk (01/05/2023)    Readmission Risk Interventions    06/25/2022   10:13 AM  Readmission Risk Prevention Plan  Transportation Screening Complete  PCP or Specialist Appt within 3-5 Days Complete  HRI or Home Care Consult Complete  Social Work Consult for Recovery Care Planning/Counseling Complete  Palliative Care Screening Not Applicable  Medication Review Oceanographer) Complete

## 2023-01-16 DIAGNOSIS — R0603 Acute respiratory distress: Secondary | ICD-10-CM | POA: Diagnosis not present

## 2023-01-16 DIAGNOSIS — I214 Non-ST elevation (NSTEMI) myocardial infarction: Secondary | ICD-10-CM | POA: Diagnosis not present

## 2023-01-16 DIAGNOSIS — Z7189 Other specified counseling: Secondary | ICD-10-CM

## 2023-01-16 DIAGNOSIS — R079 Chest pain, unspecified: Secondary | ICD-10-CM | POA: Diagnosis not present

## 2023-01-16 LAB — BASIC METABOLIC PANEL
Anion gap: 8 (ref 5–15)
BUN: 47 mg/dL — ABNORMAL HIGH (ref 8–23)
CO2: 26 mmol/L (ref 22–32)
Calcium: 8.7 mg/dL — ABNORMAL LOW (ref 8.9–10.3)
Chloride: 114 mmol/L — ABNORMAL HIGH (ref 98–111)
Creatinine, Ser: 0.85 mg/dL (ref 0.61–1.24)
GFR, Estimated: >60 mL/min (ref >60)
Glucose, Bld: 132 mg/dL — ABNORMAL HIGH (ref 70–99)
Potassium: 4.3 mmol/L (ref 3.5–5.1)
Sodium: 148 mmol/L — ABNORMAL HIGH (ref 135–145)

## 2023-01-16 LAB — CBC
HCT: 41.2 % (ref 39.0–52.0)
Hemoglobin: 12.3 g/dL — ABNORMAL LOW (ref 13.0–17.0)
MCH: 28.1 pg (ref 26.0–34.0)
MCHC: 29.9 g/dL — ABNORMAL LOW (ref 30.0–36.0)
MCV: 94.3 fL (ref 80.0–100.0)
Platelets: 242 10*3/uL (ref 150–400)
RBC: 4.37 MIL/uL (ref 4.22–5.81)
RDW: 14.3 % (ref 11.5–15.5)
WBC: 8.3 10*3/uL (ref 4.0–10.5)
nRBC: 0 % (ref 0.0–0.2)

## 2023-01-16 LAB — CULTURE, RESPIRATORY W GRAM STAIN

## 2023-01-16 LAB — GLUCOSE, CAPILLARY
Glucose-Capillary: 113 mg/dL — ABNORMAL HIGH (ref 70–99)
Glucose-Capillary: 155 mg/dL — ABNORMAL HIGH (ref 70–99)
Glucose-Capillary: 139 mg/dL — ABNORMAL HIGH (ref 70–99)
Glucose-Capillary: 173 mg/dL — ABNORMAL HIGH (ref 70–99)
Glucose-Capillary: 137 mg/dL — ABNORMAL HIGH (ref 70–99)
Glucose-Capillary: 106 mg/dL — ABNORMAL HIGH (ref 70–99)

## 2023-01-16 LAB — MAGNESIUM: Magnesium: 2.7 mg/dL — ABNORMAL HIGH (ref 1.7–2.4)

## 2023-01-16 MED ORDER — ORAL CARE MOUTH RINSE: 15.0000 mL | OROMUCOSAL | Status: DC | PRN

## 2023-01-16 MED ORDER — FLUCONAZOLE IN SODIUM CHLORIDE 400-0.9 MG/200ML-% IV SOLN
400.0000 mg | Freq: Once | INTRAVENOUS | Status: AC
  Administered 2023-01-16: 400 mg via INTRAVENOUS
  Filled 2023-01-16: qty 200

## 2023-01-16 NOTE — Progress Notes (Signed)
Daily Progress Note   [name]       Date: 01/16/2023 DOB: May 21, 1956  Age: 67 y.o. MRN#: [id number] Attending Physician: Erin Fulling, MD Primary Care Physician: [facility] Admit Date: 01/05/2023  Reason for Consultation/Follow-up: Establishing goals of care  Subjective: Notes and labs reviewed.  In to see patient, nurse at bedside.  Patient has been extubated and is able to answer questions.  Patient is not married and currently lives with a friend Corporate treasurer.  He states he helps Margie with the bills in return for staying with her.  He states he has 1 daughter April.  When asked about a second daughter as per previous note, he states he has absolutely no contact with daughter Joni Reining.  He states he has some limited contact with daughter April.  He states at baseline he wears 4 L of oxygen, but is otherwise functional and still drives.  He advises that he would want Margie to be his surrogate Management consultant.  He states he will speak to [facility] to ensure that she knows his wishes, and in hopes that she will be willing to act as a surrogate decision-maker if needed.  In regards to goals of care, he states he would never want to be reintubated.  He states he would want to have CPR.  When discussing what would happen if he required a ventilator and was not placed on one, he states he is not worried because he will breathe on his own moving forward and will not require a ventilator again.  CCM made aware of conversation.  Length of Stay: 11  Current Medications: Scheduled Meds:   budesonide (PULMICORT) nebulizer solution  0.25 mg Nebulization BID   Chlorhexidine Gluconate Cloth  6 each Topical Daily   enoxaparin (LOVENOX) injection  70 mg Subcutaneous Q24H    insulin aspart  0-20 Units Subcutaneous Q4H   ipratropium-albuterol  3 mL Nebulization Q6H   mouth rinse  15 mL Mouth Rinse Q2H    Continuous Infusions:  sodium chloride 10 mL/hr at 01/16/23 0800   ceFEPime (MAXIPIME) IV 2 g (01/16/23 1200)    PRN Meds: acetaminophen, albuterol, nitroGLYCERIN, ondansetron (ZOFRAN) IV  Physical Exam Pulmonary:     Effort: Pulmonary effort is normal.  Neurological:     Mental Status: He is alert.  Vital Signs: BP 136/82   Pulse 86   Temp 98.8 F (37.1 C)   Resp 16   Ht 6\' 1"  (1.854 m)   Wt (!) 147.5 kg   SpO2 96%   BMI 42.90 kg/m  SpO2: SpO2: 96 % O2 Device: O2 Device: High Flow Nasal Cannula O2 Flow Rate: O2 Flow Rate (L/min): (S) 5 L/min  Intake/output summary:  Intake/Output Summary (Last 24 hours) at 01/16/2023 1336 Last data filed at 01/16/2023 1100 Gross per 24 hour  Intake 4017.98 ml  Output 2327 ml  Net 1690.98 ml   LBM: Last BM Date : 01/16/23 Baseline Weight: Weight: 136.1 kg Most recent weight: Weight: (!) 147.5 kg    Patient Active Problem List   Diagnosis Date Noted   History of kidney stones 01/06/2023   Hypokalemia 01/05/2023   Bilateral leg pain 11/10/2022   Chest pain    Syncope and collapse    Chronic back pain    Acute on chronic respiratory failure (HCC) 03/14/2022   Diabetes mellitus without complication (HCC)    Acute exacerbation of chronic low back pain    Urinary tract infection    HLD (hyperlipidemia) 01/27/2022   CAD (coronary artery disease) 01/27/2022   Fall at home, initial encounter 01/27/2022   Leg weakness, bilateral 01/27/2022   Hyperkalemia    Acute CHF (congestive heart failure) (HCC) 08/15/2021   Benign prostatic hyperplasia with urinary hesitancy    Lower abdominal pain    Elevated troponin    Rash    Uncontrolled type 2 diabetes mellitus with hyperglycemia (HCC) 01/04/2021   Acute on chronic diastolic CHF (congestive heart failure) (HCC) 01/04/2021   CHF (congestive  heart failure), NYHA class I, acute, diastolic (HCC) 01/04/2021   OSA (obstructive sleep apnea) 01/04/2021   Obesity (BMI 30-39.9) 01/04/2021   Chronic respiratory failure with hypoxia (HCC) 01/04/2021   Acute on chronic congestive heart failure (HCC)    Acute on chronic respiratory failure with hypoxia (HCC) 06/20/2020   NSTEMI (non-ST elevated myocardial infarction) (HCC) 02/25/2018   Obesity, Class III, BMI 40-49.9 (morbid obesity) (HCC) 01/19/2017   Chronic obstructive pulmonary disease (COPD) (HCC) 05/13/2014   Type 2 diabetes mellitus with hyperglycemia, with long-term current use of insulin (HCC) 05/13/2014   Essential hypertension 05/13/2014    Palliative Care Assessment & Plan    Recommendations/Plan: Patient states he would not want to be intubated again, but states he would want CPR.  I explained the connection, and he states it will not matter because he will breathe on his own and will not require reintubation. He states he would want Margie to be his surrogate decision maker, and states he will talk to [facility] further about this himself.  Code Status:    Code Status Orders  (From admission, onward)           Start     Ordered   01/05/23 2031  Full code  Continuous       Question:  By:  Answer:  Other   01/05/23 2034           Code Status History     Date Active Date Inactive Code Status Order ID Comments User Context   11/10/2022 0008 11/15/2022 1642 Full Code [id number]  Andris Baumann, MD Inpatient   06/19/2022 2337 06/25/2022 1733 Full Code [id number]  Andris Baumann, MD ED   03/14/2022 [phone] 03/21/2022 2022 Full Code [id number]  Lucile Shutters, MD ED   01/27/2022 1001 01/29/2022 2145 Full Code  [id number]  Lorretta Harp, MD ED   08/15/2021 2206 08/18/2021 2128 Full Code [id number]  Gertha Calkin, MD ED   01/04/2021 0347 01/15/2021 1939 Full Code [id number]  Andris Baumann, MD ED   06/20/2020 1959 06/25/2020 0032 Full Code [id number]  Rometta Emery, MD ED    02/25/2018 1608 02/28/2018 0724 Full Code [id number]  Katha Hamming, MD ED       Prognosis:  Unable to determine   Care plan was discussed with CCM  Thank you for allowing the Palliative Medicine Team to assist in the care of this patient.    Morton Stall, NP  Please contact Palliative Medicine Team phone at [phone] for questions and concerns.

## 2023-01-16 NOTE — Progress Notes (Addendum)
0745 Sedation cut in half in preparation for extubation. 0830 Sedation turned off. See MAR. Weaning and extubation process explained to patient. Mitts removed per patient request. 0900 Tolerating weaning well. 0935 Extubated to 6L high flow nasal oxygen. 1030 remains extubated and maintaining oxygen saturation at 95%. 1100 Foley discontinued-external catheter placed. 1200 Remains extubated and doing well. Napping at intervals. VSS. 1400 Swallow evaluation per Santina Evans Speech Therapist. May have ice chips small amounts. Medications crushed in apple sauce if necessary. 1500 Patient more sassy with joking phrases . 1600 Stated he had to "sh t" refused bed pan. Stated he was getting up to the bathroom. Patient informed he could not get up.Explained he had been in the hopital for 2 weeks and physical therapy had to evaluate him first. Patient insistent he can get up. Placed on bed pan.  Had medium liquid stool in bedpan and on bedsheet. Bed sheets and pad changed. Protective cream to rectal reddened area. Patient cursed nurse and stated "You are mean!"  1800 resting quietly 1830 Called room mate,friend.

## 2023-01-16 NOTE — Progress Notes (Signed)
Nutrition Follow-up  DOCUMENTATION CODES:   Morbid obesity  INTERVENTION:   -D/c Vital 1.5, free water, and Prosource due to no feeding access -RD will follow for diet advancement and add supplements as appropriate  NUTRITION DIAGNOSIS:   Inadequate oral intake related to inability to eat (pt sedated and ventilated) as evidenced by NPO status.  Ongoing  GOAL:   Patient will meet greater than or equal to 90% of their needs  Progressing   MONITOR:   Diet advancement  REASON FOR ASSESSMENT:   Ventilator    ASSESSMENT:   67 y/o male with h/o COPD, CHF, HTN, CAD, HTN, MI, substance abuse and DM who is admitted with possible NSTEMI, CHF exacerbation, UTI, AKI, rhabdomyolysis and AMS.  5/23- extubated   Reviewed I/O's: +663 ml x 24 hours and -2.8 L since admission  UOP: 3.2 L x 24 hours  Case discussed with RN, MD, and during ICU rounds. Pt extubated today and no longer has feeding access. Pt following commands and requesting to eat; SLP consult has been ordered, as well as PT and OT consults. DSS consult has been cancelled as pt is now extubated.   Medications reviewed and include colace, solu-medrol, and miralax.   Labs reviewed: Na: 148, CBGS: 155 (inpatient orders for glycemic control are 0-20 units insulin aspart every 4 hours, 10 units insulin aspart every 4 hours, and 30 units insulin detemir BID).    Diet Order:   Diet Order     None       EDUCATION NEEDS:   No education needs have been identified at this time  Skin:  Skin Assessment: Reviewed RN Assessment  Last BM:  01/16/23 (type 6)  Height:   Ht Readings from Last 1 Encounters:  01/05/23 6\' 1"  (1.854 m)    Weight:   Wt Readings from Last 1 Encounters:  01/16/23 (!) 147.5 kg    Ideal Body Weight:  83.6 kg  BMI:  Body mass index is 42.9 kg/m.  Estimated Nutritional Needs:   Kcal:  2300-2500  Protein:  125-150 grams  Fluid:  2.0-2.2 L    Levada Schilling, RD, LDN, CDCES Registered  Dietitian II Certified Diabetes Care and Education Specialist Please refer to [facility] for RD and/or RD on-call/weekend/after hours pager

## 2023-01-16 NOTE — Progress Notes (Signed)
PHARMACY CONSULT NOTE  Pharmacy Consult for Electrolyte Monitoring and Replacement   Recent Labs: Potassium (mmol/L)  Date Value  01/16/2023 4.3  08/20/2013 3.7   Magnesium (mg/dL)  Date Value  40/98/1191 2.7 (H)   Calcium (mg/dL)  Date Value  47/82/9562 8.7 (L)   Calcium, Total (mg/dL)  Date Value  13/03/6577 9.1   Albumin (g/dL)  Date Value  46/96/2952 3.6  08/20/2013 3.7   Phosphorus (mg/dL)  Date Value  84/13/2440 2.7   Sodium (mmol/L)  Date Value  01/16/2023 148 (H)  08/20/2013 134 (L)     Assessment: 67 y/o male with h/o COPD, CHF, HTN, CAD, HTN, MI, substance abuse and DM who is admitted with possible NSTEMI, CHF exacerbation, UTI, AKI, rhabdomyolysis and AMS    Goal of Therapy:  Electrolytes WNL  Plan:  ---no electrolyte replacement warranted for today ---recheck electrolytes in am  Lowella Bandy ,PharmD Clinical Pharmacist 01/16/2023 7:24 AM

## 2023-01-16 NOTE — Progress Notes (Signed)
NAME:  Cody Alexander, MRN:  [id number], DOB:  [birth year], LOS: 11 ADMISSION DATE:  01/05/2023, CONSULTATION DATE: 01/08/2023 REFERRING MD: Dr. Georgeann Oppenheim, CHIEF COMPLAINT: AMS    History of Present Illness:  This is a 67 yo male with a PMH of COPD and Chronic Systolic Diastolic CHF.  He presented to [facility] ER on 05/12 from home via EMS with c/o shortness of breath onset 05/12.  Per ER notes EMS reported pt wears 4L O2 chronically, however he has been noncompliant with his oxygen.  When EMS arrival at pts home he was noted to be diaphoretic with initial O2 sats @86 % on 4L O2.    ED Course  Upon arrival to the ER pt alert/oriented but remained diaphoretic/tachypneic requiring 15L NRB with resolution of hypoxia.  Pt placed on Bipap.  Pt reported he had some lower extremity swelling and inability to void.  CXR concerning for mild interstitial edema.  CT Renal stone study revealed no nephrolithiasis, ureterolithiasis, or obstructive uropathy but a small amount of gas present in the bladder presumably related to procedure and small bladder diverticula.  CTA Chest negative for PE or acute pulmonary parenchymal findings.  UA concerning for possible UTI.  Pt received cefepime.  He was subsequently admitted to the progressive care unit for additional workup and treatment.  See detailed hospital course below under significant events   Pertinent  Medical History  COPD Chronic O2 @4L   Type II Diabetes Mellitus  Essential HTN  Neuropathy  Chronic Systolic Diastolic CHF  OSA  MI  Obesity  Substance Abuse   Significant Hospital Events: Including procedures, antibiotic start and stop dates in addition to other pertinent events   05/13: Pt admitted to the progressive care unit with acute on chronic hypoxic respiratory failure secondary to acute on chronic CHF exacerbation, UTI, and NSTEMI  05/15: Pt developed acute encephalopathy and acute hypercapnic hypoxic respiratory failure overnight requiring portable  Bipap.  However, pt failed Bipap developed jerking movements concerning for myoclonus requiring transfer to ICU. PCCM team consulted and pt mechanically intubated  01/09/23- patient had abnormal EKG today but repeat cardiac biomarkers seemed to be normal. He is on MV today he is not ready for SBT.  We did deliver diamox today to help with acid base disorder.  01/10/23- for SBT today, on insulin gtt, electrolytes are improved. Reducing steroids today.  01/11/23- failed SBT today. >100k colonies of ecoli on Urine culture. BP is low today , have dcd aldactone and flomax. Continuing rocephin, unable to liberate from MV today. 01/12/23- PRVC weaned to 50%, secretions improved.  5/20 severe hypoxia, failure to wean from vent 5/21 severe COPD, hypoxia-failure to wean from vent 5/22 severe COPD, severe Hypoxia  Interim History / Subjective:  Remains intubated Remains on vent Remains critically ill Palliative care team consulted Plan for WUA and SAT/SBT  Vent Mode: PRVC FiO2 (%):  [45 %-60 %] 45 % Set Rate:  [22 bmp] 22 bmp Vt Set:  [550 mL] 550 mL PEEP:  [8 cmH20-10 cmH20] 8 cmH20 Plateau Pressure:  [21 cmH20] 21 cmH20   Objective   Blood pressure 104/64, pulse (!) 56, temperature 98.6 F (37 C), temperature source Axillary, resp. rate (!) 22, height 6\' 1"  (1.854 m), weight (!) 147.5 kg, SpO2 97 %.    Vent Mode: PRVC FiO2 (%):  [45 %-60 %] 45 % Set Rate:  [22 bmp] 22 bmp Vt Set:  [550 mL] 550 mL PEEP:  [8 cmH20-10 cmH20] 8 cmH20 Plateau Pressure:  [21  cmH20] 21 cmH20   Intake/Output Summary (Last 24 hours) at 01/16/2023 0730 Last data filed at 01/16/2023 0400 Gross per 24 hour  Intake 3867.98 ml  Output 3205 ml  Net 662.98 ml    Filed Weights   01/14/23 0400 01/15/23 0500 01/16/23 0300  Weight: (!) 147.6 kg (!) 148.4 kg (!) 147.5 kg       REVIEW OF SYSTEMS  PATIENT IS UNABLE TO PROVIDE COMPLETE REVIEW OF SYSTEMS DUE TO SEVERE CRITICAL ILLNESS   PHYSICAL  EXAMINATION:  GENERAL:critically ill appearing, +resp distress EYES: Pupils equal, round, reactive to light.  No scleral icterus.  MOUTH: Moist mucosal membrane. INTUBATED NECK: Supple.  PULMONARY: Lungs clear to auscultation, +rhonchi, +wheezing CARDIOVASCULAR: S1 and S2.  Regular rate and rhythm GASTROINTESTINAL: Soft, nontender, -distended. Positive bowel sounds.  MUSCULOSKELETAL: No swelling, clubbing, or edema.  NEUROLOGIC: obtunded,sedated SKIN:normal, warm to touch, Capillary refill delayed  Pulses present bilaterally  Assessment & Plan:  Acute on chronic hypoxic hypercapnic respiratory failure secondary  To SEVERE AECOPD and acute DECOMPENSATED HfPEF CHF exacerbation WITH EF >55%   Severe ACUTE Hypoxic and Hypercapnic Respiratory Failure -continue Mechanical Ventilator support -Wean Fio2 and PEEP as tolerated -VAP/VENT bundle implementation - Wean PEEP & FiO2 as tolerated, maintain SpO2 > 88% - Head of bed elevated 30 degrees, VAP protocol in place - Plateau pressures less than 30 cm H20  - Intermittent chest x-ray & ABG PRN - Ensure adequate pulmonary hygiene  -will perform SAT/SBT when respiratory parameters are met   SEVERE COPD EXACERBATION -continue IV steroids as prescribed -continue NEB THERAPY as prescribed -wean fio2 as needed and tolerated  Elevated Troponin secondary to demand ischemia  Acute on chronic diastolic systolic CHF  - Trend troponins  - consider systemic anticoagulation as troponin trend suggests - continuous cardiac monitoring  Prn levophed gtt to maintain map >65  - Hold outpatient antihypertensives and beta-blockers for now  - Continue aspirin and atorvastatin  - Cardiology consulted appreciate input    RENAL -continue Foley Catheter-assess need -Avoid nephrotoxic agents -Follow urine output, BMP -Ensure adequate renal perfusion, optimize oxygenation -Renal dose medications   Intake/Output Summary (Last 24 hours) at 01/16/2023  0731 Last data filed at 01/16/2023 0400 Gross per 24 hour  Intake 3867.98 ml  Output 3205 ml  Net 662.98 ml      Latest Ref Rng & Units 01/16/2023    4:42 AM 01/15/2023    6:58 AM 01/14/2023    4:44 AM  BMP  Glucose 70 - 99 mg/dL 413  244  010   BUN 8 - 23 mg/dL 47  52  61   Creatinine 0.61 - 1.24 mg/dL 2.72  5.36  6.44   Sodium 135 - 145 mmol/L 148  151  145   Potassium 3.5 - 5.1 mmol/L 4.3  5.0  5.1   Chloride 98 - 111 mmol/L 114  114  108   CO2 22 - 32 mmol/L 26  30  30    Calcium 8.9 - 10.3 mg/dL 8.7  9.0  8.5     UTI secondary to [facility] therapy for pneumonia Cefepime and VANC now - Trend WBC and monitor fever curve  - Trend PCT    NEUROLOGY ACUTE METABOLIC ENCEPHALOPATHY WUA pending    ENDO - ICU hypoglycemic\Hyperglycemia protocol -check FSBS per protocol   GI GI PROPHYLAXIS as indicated  NUTRITIONAL STATUS DIET-->TF's as tolerated Constipation protocol as indicated   ELECTROLYTES -follow labs as needed -replace as needed -pharmacy consultation and following  Best Practice (right click and "Reselect all SmartList Selections" daily)   Diet/type: NPO DVT prophylaxis: LMWH GI prophylaxis: H2B Lines: N/A Foley:  N/A Code Status:  full code Last date of multidisciplinary goals of care discussion [N/A]  05/15: Attempted to contact pts friend Lalla Brothers utilizing phone number listed in epic, however the phone number listed in epic is the wrong number.  No other family contact information available in epic.  5/20 palliative care team consulted Labs   CBC: Recent Labs  Lab 01/11/23 0500 01/12/23 0448 01/14/23 0444 01/15/23 0658 01/16/23 0442  WBC 15.5* 12.2* 10.5 16.4* 8.3  HGB 12.7* 12.6* 12.8* 13.0 12.3*  HCT 39.7 40.2 42.0 43.0 41.2  MCV 89.8 91.6 94.0 94.5 94.3  PLT 226 251 281 341 242     Basic Metabolic Panel: Recent Labs  Lab 01/09/23 1611 01/09/23 2051 01/09/23 2051 01/10/23 0319 01/11/23 0500 01/12/23 0448 01/14/23 0444  01/15/23 0658 01/16/23 0442  NA  --   --    < > 136 138 141 145 151* 148*  K 3.3* 3.3*  --  3.1* 4.6 4.8 5.1 5.0 4.3  CL  --   --    < > 95* 104 106 108 114* 114*  CO2  --   --    < > 29 27 30 30 30 26   GLUCOSE  --   --    < > 251* 239* 221* 153* 202* 132*  BUN  --   --    < > 28* 53* 59* 61* 52* 47*  CREATININE  --   --    < > 1.01 1.09 1.06 0.98 0.99 0.85  CALCIUM  --   --    < > 9.1 8.7* 8.8* 8.5* 9.0 8.7*  MG 2.5*  --   --  2.7*  --   --  3.2* 2.7* 2.7*  PHOS 5.1* 3.9  --  2.7  --   --   --   --   --    < > = values in this interval not displayed.    GFR: Estimated Creatinine Clearance: 129.3 mL/min (by C-G formula based on SCr of 0.85 mg/dL). Recent Labs  Lab 01/12/23 0448 01/14/23 0444 01/15/23 0658 01/16/23 0442  WBC 12.2* 10.5 16.4* 8.3     Liver Function Tests: Recent Labs  Lab 01/09/23 2051  AST 36  ALT 23  ALKPHOS 52  BILITOT 1.0  PROT 7.1  ALBUMIN 3.6     DVT/GI PRX  assessed I Assessed the need for Labs I Assessed the need for Foley I Assessed the need for Central Venous Line Family Discussion when available I Assessed the need for Mobilization I made an Assessment of medications to be adjusted accordingly Safety Risk assessment completed  CASE DISCUSSED IN MULTIDISCIPLINARY ROUNDS WITH ICU TEAM     Critical Care Time devoted to patient care services described in this note is 55 minutes.  Critical care was necessary to treat /prevent imminent and life-threatening deterioration. Overall, patient is critically ill, prognosis is guarded.  Patient with Multiorgan failure and at high risk for cardiac arrest and death.    Lucie Leather, M.D.  [facility]  Medical Director [facility] Medical Director [facility] Cardio-Pulmonary Department

## 2023-01-16 NOTE — TOC Progression Note (Signed)
Transition of Care [facility]) - Progression Note    Patient Details  Name: Tylyn Blaustein MRN: [id number] Date of Birth: [birth year]  Transition of Care [facility]) CM/SW Contact  Darolyn Rua, [place] Phone Number: 01/16/2023, 10:39 AM  Clinical Narrative:     CSW informed Sharlyne Cai with DSS that patient is now extubated and should improve to be able to make his own decisions, requested guardianship process be halted. Patient to be re assessed by palliative for GOC given medical improvements, PT/OT to be ordered pending evals for recommendations.     Barriers to Discharge: Continued Medical Work up  Expected Discharge Plan and Services       Living arrangements for the past 2 months: Single Family Home                                       Social Determinants of Health (SDOH) Interventions SDOH Screenings   Food Insecurity: Patient Unable To Answer (01/15/2023)  Housing: High Risk (01/15/2023)  Transportation Needs: No Transportation Needs (11/10/2022)  Utilities: Patient Unable To Answer (01/15/2023)  Tobacco Use: Medium Risk (01/05/2023)    Readmission Risk Interventions    06/25/2022   10:13 AM  Readmission Risk Prevention Plan  Transportation Screening Complete  PCP or Specialist Appt within 3-5 Days Complete  HRI or Home Care Consult Complete  Social Work Consult for Recovery Care Planning/Counseling Complete  Palliative Care Screening Not Applicable  Medication Review Oceanographer) Complete

## 2023-01-16 NOTE — Progress Notes (Signed)
SUBJECTIVE:    Patient is a 67 y.o. 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  Patient states he had not been feeling well for a few days. On Sunday. he drove out of town with a friend, heavily exerted himself while walking around outside. Patient proceeded to drive himself home when he began to feel chest discomfort in the center of his chest, short of breath, diaphoretic, loss of bladder and bowel control. After patient arrived at home, EMS was called to bring him to the ED.    Patient is established with [facility] cardiology, is non-compliant with follow up outpatient appointments.    Overnight, patient developed acute encephalopathy with concern for hypercapnia, acute respiratory failure requiring BiPAP, hyperammonemia. Troponin levels repeated, 71>75. Repeat EKG, in sinus rhythm, no acute changes compared to EKG on arrival to ED.    Rapid response called on 01/08/23, patient developed acute encephalopathy and acute hypercapnic hypoxic respiratory failure requiring BiPAP. Patient failed BiPAP, developed jerking movements concerning for myoclonus. Patient was subsequently transferred to the ICU where he was mechanically intubated.    01/09/23 EKG performed for ST elevation on monitor. EKG showed no significant ST elevation inferolaterally. Troponin levels repeated, 84>78>65>62.    Attempt made to wean patient from the vent on 01/12/23 and 01/13/23, severe hypoxia, failure to wean.   Patient being weaned from ventilator.    Intake/Output Summary (Last 24 hours) at 01/16/2023 0850 Last data filed at 01/16/2023 0400 Gross per 24 hour  Intake 3867.98 ml  Output 2604 ml  Net 1263.98 ml    LABS: Basic Metabolic  Panel: Recent Labs    01/15/23 0658 01/16/23 0442  NA 151* 148*  K 5.0 4.3  CL 114* 114*  CO2 30 26  GLUCOSE 202* 132*  BUN 52* 47*  CREATININE 0.99 0.85  CALCIUM 9.0 8.7*  MG 2.7* 2.7*   Liver Function Tests: No results for input(s): "AST", "ALT", "ALKPHOS", "BILITOT", "PROT", "ALBUMIN" in the last 72 hours. No results for input(s): "LIPASE", "AMYLASE" in the last 72 hours. CBC: Recent Labs    01/15/23 0658 01/16/23 0442  WBC 16.4* 8.3  HGB 13.0 12.3*  HCT 43.0 41.2  MCV 94.5 94.3  PLT 341 242   Cardiac Enzymes: No results for input(s): "CKTOTAL", "CKMB", "CKMBINDEX", "TROPONINI" in the last 72 hours. BNP: Invalid input(s): "POCBNP" D-Dimer: No results for input(s): "DDIMER" in the last 72 hours. Hemoglobin A1C: No results for input(s): "HGBA1C" in the last 72 hours. Fasting Lipid Panel: Recent Labs    01/14/23 0444  TRIG 131   Thyroid Function Tests: No results for input(s): "TSH", "T4TOTAL", "T3FREE", "THYROIDAB" in the last 72 hours.  Invalid input(s): "FREET3" Anemia Panel: No results for input(s): "VITAMINB12", "FOLATE", "FERRITIN", "TIBC", "IRON", "RETICCTPCT" in the last 72 hours.   PHYSICAL EXAM General: acutely ill appearing, mechanically intubated  HEENT:  Normocephalic and atramatic Neck:  No JVD.  Lungs: Clear bilaterally to auscultation, even, non-labored  Heart: HRRR . Normal S1 and S2 without gallops or murmurs.  Abdomen: Bowel sounds are positive, abdomen soft and non-tender  Extremities: No clubbing, cyanosis or edema.   Neuro: sedated Psych: sedated  TELEMETRY:  sinus rhtyhm, HR 98 bpm  ASSESSMENT AND  PLAN: Patient is a 67 y.o. male with medical history significant for combined CHF with recovered EF (EF 65% G1 DD 07/22/2022), COPD on home O2 at 4L, CAD s/p stent x1, HTN, T2DM , class III obesity, OSA who presented to the ED on 01/05/23 for chest discomfort and shortness of breath. Echo 01/07/23 revealed EF 65-70%, severe LVH, normal  wall motion, grade II DD. Troponin levels continued to be flat. Consider further cardiac workup possibly cardiac cath when the patient has been extubated. Patient awake and attempting to communicate this morning. Patient remains critically ill.  Respiratory distress likely from COPD exacerbation. Will continue to follow.    ICD-10-CM   1. Respiratory distress  R06.03     2. Congestive heart failure, unspecified HF chronicity, unspecified heart failure type (HCC)  I50.9     3. Lower urinary tract infection  N39.0       Principal Problem:   NSTEMI (non-ST elevated myocardial infarction) (HCC) Active Problems:   Chronic obstructive pulmonary disease (COPD) (HCC)   Type 2 diabetes mellitus with hyperglycemia, with long-term current use of insulin (HCC)   Essential hypertension   Obesity, Class III, BMI 40-49.9 (morbid obesity) (HCC)   Acute on chronic respiratory failure with hypoxia (HCC)   Acute on chronic diastolic CHF (congestive heart failure) (HCC)   OSA (obstructive sleep apnea)   Benign prostatic hyperplasia with urinary hesitancy   Urinary tract infection   Hypokalemia   History of kidney stones    Britiany Silbernagel, FNP-C 01/16/2023 8:50 AM

## 2023-01-17 DIAGNOSIS — R079 Chest pain, unspecified: Secondary | ICD-10-CM | POA: Diagnosis not present

## 2023-01-17 DIAGNOSIS — I214 Non-ST elevation (NSTEMI) myocardial infarction: Secondary | ICD-10-CM | POA: Diagnosis not present

## 2023-01-17 DIAGNOSIS — R0603 Acute respiratory distress: Secondary | ICD-10-CM | POA: Diagnosis not present

## 2023-01-17 LAB — CBC
HCT: 43.7 % (ref 39.0–52.0)
Hemoglobin: 13.2 g/dL (ref 13.0–17.0)
MCH: 28.2 pg (ref 26.0–34.0)
MCHC: 30.2 g/dL (ref 30.0–36.0)
MCV: 93.4 fL (ref 80.0–100.0)
Platelets: 303 10*3/uL (ref 150–400)
RBC: 4.68 MIL/uL (ref 4.22–5.81)
RDW: 13.8 % (ref 11.5–15.5)
WBC: 10.6 10*3/uL — ABNORMAL HIGH (ref 4.0–10.5)
nRBC: 0 % (ref 0.0–0.2)

## 2023-01-17 LAB — GLUCOSE, CAPILLARY
Glucose-Capillary: 112 mg/dL — ABNORMAL HIGH (ref 70–99)
Glucose-Capillary: 146 mg/dL — ABNORMAL HIGH (ref 70–99)
Glucose-Capillary: 163 mg/dL — ABNORMAL HIGH (ref 70–99)
Glucose-Capillary: 210 mg/dL — ABNORMAL HIGH (ref 70–99)
Glucose-Capillary: 170 mg/dL — ABNORMAL HIGH (ref 70–99)

## 2023-01-17 LAB — BASIC METABOLIC PANEL
Anion gap: 7 (ref 5–15)
BUN: 35 mg/dL — ABNORMAL HIGH (ref 8–23)
CO2: 27 mmol/L (ref 22–32)
Calcium: 8.9 mg/dL (ref 8.9–10.3)
Chloride: 115 mmol/L — ABNORMAL HIGH (ref 98–111)
Creatinine, Ser: 0.87 mg/dL (ref 0.61–1.24)
GFR, Estimated: >60 mL/min (ref >60)
Glucose, Bld: 132 mg/dL — ABNORMAL HIGH (ref 70–99)
Potassium: 4.1 mmol/L (ref 3.5–5.1)
Sodium: 149 mmol/L — ABNORMAL HIGH (ref 135–145)

## 2023-01-17 LAB — CULTURE, RESPIRATORY W GRAM STAIN

## 2023-01-17 MED ORDER — ADULT MULTIVITAMIN W/MINERALS CH
1.0000 | ORAL_TABLET | Freq: Every day | ORAL | Status: DC
  Administered 2023-01-18 – 2023-01-21 (×4): 1 via ORAL
  Filled 2023-01-17 (×4): qty 1

## 2023-01-17 MED ORDER — METHYLPREDNISOLONE SODIUM SUCC 40 MG IJ SOLR
40.0000 mg | INTRAMUSCULAR | Status: DC
  Administered 2023-01-17: 40 mg via INTRAVENOUS
  Filled 2023-01-17: qty 1

## 2023-01-17 MED ORDER — ENSURE ENLIVE PO LIQD: 237.0000 mL | Freq: Two times a day (BID) | ORAL | Status: DC

## 2023-01-17 MED ORDER — PREDNISONE 20 MG PO TABS
30.0000 mg | ORAL_TABLET | Freq: Once | ORAL | Status: AC
  Administered 2023-01-18: 30 mg via ORAL
  Filled 2023-01-17: qty 1

## 2023-01-17 MED ORDER — LISINOPRIL 20 MG PO TABS
20.0000 mg | ORAL_TABLET | Freq: Every day | ORAL | Status: DC
  Administered 2023-01-17 – 2023-01-18 (×2): 20 mg via ORAL
  Filled 2023-01-17: qty 4
  Filled 2023-01-17: qty 1

## 2023-01-17 NOTE — Progress Notes (Signed)
Speech Language Pathology Treatment: Dysphagia  Patient Details Name: Cody Alexander MRN: [id number] DOB: Dec 21, 1955 Today's Date: 01/17/2023 Time: 1100-1145 SLP Time Calculation (min) (ACUTE ONLY): 45 min  Assessment / Plan / Recommendation Clinical Impression  Pt seen for ongoing assessment of swallowing today; trials to upgrade diet as appropriate. Pt awake, oriented x3. Pt was verbal w/ improved vocal quality and volume of speech; min dysphonia+. Cough effort much improved -- pt stated current cough presentation/effort is "normal" for him "at home b/c of my COPD". Pt exhibited an intermittent cough w/ expectoration of phlegm -- suspect d/t the recent lengthy oral intubation. NSG reported ongoing w/ spitting phlegm "most of morning" outside of any ice chips/po's. NSG suspected airway irritation also. He follows commands appropriately. Improved strength of UEs bilat -- pt held Cup to drink independently feeding self drinks.  On [place] O2 support 4L, afebrile. WBC 10.6.   Pt appears to present w/ grossly functional oropharyngeal phase swallowing w/ po trials; upgraded po's today. No immediate, overt oropharyngeal phase dysphagia noted w/ the trials given(ice chips, thin liquids, Purees and soft solids- for conservation of energy); no overt neuromuscular deficits noted. Pt consumed po trials w/ No immediate, overt clinical s/s of aspiration during po trials.  Pt appears at reduced risk for aspiration when following general aspiration precautions, using a slightly modified diet consistency for conservation of energy, and w/ Supervision/Support at meals d/t overall weakness and reduced insight(see medical history/admit).    Pt does have challenging factors that could impact oropharyngeal swallowing to include min decreased mental status awareness and lengthy oral intubation, weakness/deconditioning hampering his UE movements and self-feeding abilities, and Baseline poor pulmonary status/COPD. These  factors can increase risk for aspiration, dysphagia as well as decreased oral intake overall.    During po trials given, pt consumed consistencies w/ No immediate, overt coughing nor decline in vocal quality. No sustained change in respiratory presentation during/post trials: O2 sats remained 94-96%, RR 20. Pt's mild/half cough w/ expectoration of phlegm occurred intermittently but not appearing to the swallow and did not increase as po trials increased. HE CONSUMED ~3 FULL CUPS OF WATER W/ THIS SLP.  Oral phase appeared [facility] w/ timely bolus management, mastication, and control of bolus propulsion for A-P transfer for swallowing. Oral clearing achieved w/ trial consistencies. Softened foods given for ease of mastication.  MD is treating for THRUSH.    Recommend a Dys level 3 (mech soft) diet w/ gravies to moisten foods(for ease and conservation of energy in setting of COPD/weakness); thin liquids VIA CUP. Recommend general aspiration precautions, Supervision and support at meals for feeding by NSG. Pills WHOLE vs CRUSHED in Puree for safer, easier swallowing.  Education given on general aspiration precautions to pt; diet consistency. MD/NSG updated, agreed. ST services will f/u w/ toleration of diet and education for pt prior to signing off. Recommend Dietician f/u for support. Precautions posted at bedside. NSG agreed.       HPI HPI: This is a 67 yo male with a PMH of Obesity, COPD and Chronic Systolic Diastolic CHF, OSA, neuropathy, Hom eO2 4L, substance abuse(+Meth).  He presented to [facility] ER on 05/12 from home via EMS with c/o shortness of breath onset 05/12.  Per ER notes EMS reported pt wears 4L O2 chronically, however he has been noncompliant with his oxygen.  When EMS arrival at pts home he was noted to be diaphoretic with initial O2 sats @86 % on 4L O2.       ED Course  Upon arrival to the ER pt alert/oriented but remained diaphoretic/tachypneic requiring 15L NRB with resolution of hypoxia.  Pt  placed on Bipap.  Pt reported he had some lower extremity swelling and inability to void.  CXR concerning for mild interstitial edema.  Pt developed acute encephalopathy and acute hypercapnic hypoxic respiratory failure overnight requiring portable Bipap.  However, pt failed Bipap developed jerking movements concerning for myoclonus requiring transfer to ICU. PCCM team consulted and pt mechanically intubated; extubation on 01/16/2023.  Recent CXR: Similar left base consolidation with left effusion.  Pt has had 7 ED visits/admissions in ~6 months.      SLP Plan  Continue with current plan of care (po check)      Recommendations for follow up therapy are one component of a multi-disciplinary discharge planning process, led by the attending physician.  Recommendations may be updated based on patient status, additional functional criteria and insurance authorization.    Recommendations  Diet recommendations: Dysphagia 3 (mechanical soft);Thin liquid (for conservation of energy) Liquids provided via: Cup;No straw Medication Administration: Whole meds with puree (vs need to Crush in puree) Supervision: Patient able to self feed;Staff to assist with self feeding;Full supervision/cueing for compensatory strategies Compensations: Slow rate;Minimize environmental distractions;Small sips/bites;Lingual sweep for clearance of pocketing;Follow solids with liquid Postural Changes and/or Swallow Maneuvers: Out of bed for meals;Seated upright 90 degrees;Upright 30-60 min after meal                 (Dietician f/u) Oral care BID;Staff/trained caregiver to provide oral care (support)   Intermittent Supervision/Assistance Dysphagia, unspecified (R13.10) (impaired Pulmonary status at baseline; COPD on home O2 4L)     Continue with current plan of care (po check)       Jerilynn Som, MS, CCC-SLP Speech Language Pathologist Rehab Services; [facility] -  [phone]  (ascom) Cody Alexander  01/17/2023, 3:24 PM

## 2023-01-17 NOTE — Evaluation (Signed)
Physical Therapy Evaluation Patient Details Name: Cody Alexander MRN: [id number] DOB: 1955-11-24 Today's Date: 01/17/2023  History of Present Illness  presented to ER secondary to respiratory distress and chest pain; admitted for management of acute encephalopathy, UTI, NSTEMI and hypercapnic respiratory failure requiring intubation 5/15-5/23  Clinical Impression  Patient resting in bed upon arrival to room; alert and oriented to basic information, follows commands, pleasant and cooperative.  Generally confused/unaware of events surrounding current hospitalization.  Denies pain.  Bilat UE/LE strength and ROM grossly symmetrical and WFL for basic transfers and gait; no focal weakness appreciated.  Able to complete bed mobility with mod/max assist +1-2; sitting balance with close sup; sit/stand and standing balance with RW, max assist +2 for safety.  Requires extensive assist for lift off, postural extension; dep on UEs pulling on RW to assist (assist for RW stabilization).  Maintains only 5-10 seconds each trial, but does demonstrate progressive improvement in truncal extension and upright posture with each rep.  Unsafe/unable to progress with additional stepping/gait; will continue to assess/progress as medically appropriate. Would benefit from skilled PT to address above deficits and promote optimal return to PLOF; recommend post-acute PT follow up as indicated by interdisciplinary care team.            Recommendations for follow up therapy are one component of a multi-disciplinary discharge planning process, led by the attending physician.  Recommendations may be updated based on patient status, additional functional criteria and insurance authorization.  Follow Up Recommendations Can patient physically be transported by private vehicle: No     Assistance Recommended at Discharge Frequent or constant Supervision/Assistance  Patient can return home with the following  Two people to help with  walking and/or transfers;Two people to help with bathing/dressing/bathroom    Equipment Recommendations    Recommendations for Other Services       Functional Status Assessment Patient has had a recent decline in their functional status and demonstrates the ability to make significant improvements in function in a reasonable and predictable amount of time.     Precautions / Restrictions Precautions Precautions: Fall Restrictions Weight Bearing Restrictions: No      Mobility  Bed Mobility Overal bed mobility: Needs Assistance Bed Mobility: Supine to Sit, Sit to Supine     Supine to sit: Mod assist Sit to supine: Mod assist, Max assist   General bed mobility comments: elevated HOB, heavy use of bedrails; assist for LE management and full truncal righting    Transfers Overall transfer level: Needs assistance Equipment used: Rolling walker (2 wheels) Transfers: Sit to/from Stand Sit to Stand: Max assist, +2 physical assistance           General transfer comment: extensive assist for lift off, postural extension; dep on UEs pulling on RW to assist (assist for RW stabilization).  Maintains only 5-10 seconds each trial, but does demonstrate progressive improvement in truncal extension and upright posture with each rep.  Completed for total of 3 reps.    Ambulation/Gait               General Gait Details: unsafe/unable  Stairs            Wheelchair Mobility    Modified Rankin (Stroke Patients Only)       Balance Overall balance assessment: Needs assistance Sitting-balance support: No upper extremity supported, Feet supported Sitting balance-Leahy Scale: Good     Standing balance support: Bilateral upper extremity supported Standing balance-Leahy Scale: Poor  Pertinent Vitals/Pain Pain Assessment Pain Assessment: No/denies pain    Home Living Family/patient expects to be discharged to:: Private  residence Living Arrangements: Alone Available Help at Discharge: Friend(s);Available PRN/intermittently Type of Home: Mobile home Home Access: Stairs to enter Entrance Stairs-Rails: Left Entrance Stairs-Number of Steps: 4   Home Layout: One level Home Equipment: Wheelchair - Forensic psychologist (2 wheels);Cane - single point Additional Comments: Pt helps Margie with lawn care, some with meals.    Prior Function Prior Level of Function : Independent/Modified Independent;Driving;History of Falls (last six months)             Mobility Comments: independent, drives; home O2 at 4L ADLs Comments: Wearing shorts; "carefully" getting dressed; difficulty with LB dressing. Doing HHPT and HHOT prior to admission.     Hand Dominance   Dominant Hand: Right    Extremity/Trunk Assessment   Upper Extremity Assessment Upper Extremity Assessment: Generalized weakness (grossly at least 4-/5 throughout)    Lower Extremity Assessment Lower Extremity Assessment: Generalized weakness (grossly at least 4-/5 throughout; no focal weakness appreciated)       Communication   Communication: No difficulties  Cognition Arousal/Alertness: Awake/alert Behavior During Therapy: WFL for tasks assessed/performed Overall Cognitive Status: Within Functional Limits for tasks assessed                                 General Comments: Alert and oriented to basic information; follows commands; generally unaware of details/events surrounding hospital admission        General Comments General comments (skin integrity, edema, etc.): Pt on 4L O2 throughout session; 93% saturation at EOB and after standing.    Exercises Other Exercises Other Exercises: Transitioned to chair position in bed, pillow wedged under R hip for passive weight shift to L (to maintain midline orientation).  Positioned to comfort with needs in reach. Other Exercises: Reviewed HEP-midline in M/L plane, ankle pumps, LAQs;  patient voiced understanding.   Assessment/Plan    PT Assessment Patient needs continued PT services  PT Problem List Decreased strength;Decreased activity tolerance;Decreased balance;Decreased mobility;Decreased knowledge of use of DME;Decreased safety awareness;Decreased knowledge of precautions;Cardiopulmonary status limiting activity       PT Treatment Interventions Gait training;Stair training;Functional mobility training;Therapeutic activities;Patient/family education;Therapeutic exercise;Balance training;DME instruction    PT Goals (Current goals can be found in the Care Plan section)  Acute Rehab PT Goals Patient Stated Goal: to get stronger and walk again PT Goal Formulation: With patient Time For Goal Achievement: 01/31/23 Potential to Achieve Goals: Good    Frequency Min 2X/week     Co-evaluation PT/OT/SLP Co-Evaluation/Treatment: Yes Reason for Co-Treatment: For patient/therapist safety;Complexity of the patient's impairments (multi-system involvement) PT goals addressed during session: Mobility/safety with mobility OT goals addressed during session: ADL's and self-care;Strengthening/ROM       AM-PAC PT "6 Clicks" Mobility  Outcome Measure Help needed turning from your back to your side while in a flat bed without using bedrails?: A Lot Help needed moving from lying on your back to sitting on the side of a flat bed without using bedrails?: A Lot Help needed moving to and from a bed to a chair (including a wheelchair)?: Total Help needed standing up from a chair using your arms (e.g., wheelchair or bedside chair)?: A Lot Help needed to walk in hospital room?: Total Help needed climbing 3-5 steps with a railing? : Total 6 Click Score: 9    End of Session Equipment  Utilized During Treatment: Gait belt Activity Tolerance: Patient tolerated treatment well Patient left: with call bell/phone within reach;in bed;with bed alarm set Nurse Communication: Mobility  status PT Visit Diagnosis: Difficulty in walking, not elsewhere classified (R26.2);Muscle weakness (generalized) (M62.81)    Time: 1610-9604 PT Time Calculation (min) (ACUTE ONLY): 18 min   Charges:   PT Evaluation $PT Eval Moderate Complexity: 1 Mod          Talishia Betzler H. Manson Passey, PT, DPT, NCS 01/17/23, 4:34 PM [phone]
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
## 2023-01-17 NOTE — Progress Notes (Signed)
Resmed standby; pt refused at this time. Pt educated. Will check back later, device ready for use at bedside.

## 2023-01-17 NOTE — Progress Notes (Signed)
SUBJECTIVE: Patient is a 67 y.o. 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  Patient states he had not been feeling well for a few days. On Sunday. he drove out of town with a friend, heavily exerted himself while walking around outside. Patient proceeded to drive himself home when he began to feel chest discomfort in the center of his chest, short of breath, diaphoretic, loss of bladder and bowel control. After patient arrived at home, EMS was called to bring him to the ED.    Patient is established with [facility] cardiology, is non-compliant with follow up outpatient appointments.    Overnight, patient developed acute encephalopathy with concern for hypercapnia, acute respiratory failure requiring BiPAP, hyperammonemia. Troponin levels repeated, 71>75. Repeat EKG, in sinus rhythm, no acute changes compared to EKG on arrival to ED.    Rapid response called on 01/08/23, patient developed acute encephalopathy and acute hypercapnic hypoxic respiratory failure requiring BiPAP. Patient failed BiPAP, developed jerking movements concerning for myoclonus. Patient was subsequently transferred to the ICU where he was mechanically intubated.    01/09/23 EKG performed for ST elevation on monitor. EKG showed no significant ST elevation inferolaterally. Troponin levels repeated, 84>78>65>62.    Attempt made to wean patient from the vent on 01/12/23 and 01/13/23, severe hypoxia, failure to wean.   Patient weaned from ventilator, extubated on 01/16/23.    Vitals:   01/17/23 0400 01/17/23 0500 01/17/23 0600 01/17/23 0755  BP: (!) 144/92 (!) 142/91 (!) 151/95   Pulse: 80 79 75   Resp: 18 19 19    Temp: 98.8 F (37.1 C)     TempSrc:  Axillary     SpO2: 92% 96% 96% 99%  Weight:      Height:        Intake/Output Summary (Last 24 hours) at 01/17/2023 0902 Last data filed at 01/17/2023 0600 Gross per 24 hour  Intake 1130.62 ml  Output 2550 ml  Net -1419.38 ml    LABS: Basic Metabolic Panel: Recent Labs    01/15/23 0658 01/16/23 0442 01/17/23 0611  NA 151* 148* 149*  K 5.0 4.3 4.1  CL 114* 114* 115*  CO2 30 26 27   GLUCOSE 202* 132* 132*  BUN 52* 47* 35*  CREATININE 0.99 0.85 0.87  CALCIUM 9.0 8.7* 8.9  MG 2.7* 2.7*  --    Liver Function Tests: No results for input(s): "AST", "ALT", "ALKPHOS", "BILITOT", "PROT", "ALBUMIN" in the last 72 hours. No results for input(s): "LIPASE", "AMYLASE" in the last 72 hours. CBC: Recent Labs    01/16/23 0442 01/17/23 0611  WBC 8.3 10.6*  HGB 12.3* 13.2  HCT 41.2 43.7  MCV 94.3 93.4  PLT 242 303   Cardiac Enzymes: No results for input(s): "CKTOTAL", "CKMB", "CKMBINDEX", "TROPONINI" in the last 72 hours. BNP: Invalid input(s): "POCBNP" D-Dimer: No results for input(s): "DDIMER" in the last 72 hours. Hemoglobin A1C: No results for input(s): "HGBA1C" in the last 72 hours. Fasting Lipid Panel: No results for input(s): "CHOL", "HDL", "LDLCALC", "TRIG", "CHOLHDL", "LDLDIRECT" in the last 72 hours. Thyroid Function Tests: No results for input(s): "TSH", "T4TOTAL", "T3FREE", "THYROIDAB" in the last 72 hours.  Invalid input(s): "FREET3" Anemia Panel: No results for input(s): "VITAMINB12", "FOLATE", "  FERRITIN", "TIBC", "IRON", "RETICCTPCT" in the last 72 hours.   PHYSICAL EXAM General: Well developed, well nourished, in no acute distress HEENT:  Normocephalic and atramatic Neck:  No JVD.  Lungs: Clear bilaterally to auscultation, even, non-labored Heart: HRRR . Normal S1 and S2 without gallops or murmurs.  Abdomen: Bowel sounds are positive, abdomen soft and non-tender, obese Extremities: No clubbing, cyanosis or edema.   Neuro: Alert and oriented X 3. Psych:   Good affect, responds appropriately  TELEMETRY: sinus rhythm, HR 76 bpm  ASSESSMENT AND PLAN: Patient is a 67 y.o. male with medical history significant for combined CHF with recovered EF (EF 65% G1 DD 07/22/2022), COPD on home O2 at 4L, CAD s/p stent x1, HTN, T2DM , class III obesity, OSA who presented to the ED on 01/05/23 for chest discomfort and shortness of breath. Echo 01/07/23 revealed EF 65-70%, severe LVH, normal wall motion, grade II DD. Consider further cardiac workup possibly cardiac cath when the patient has been extubated. Patient awake and communicating this morning. Denies chest pain, shortness of breath improving. Respiratory distress likely from COPD exacerbation. Blood pressure now elevated, will attempt to re-start to add his home medications. Will add Lisinopril 20 mg daily. Will continue to follow.    ICD-10-CM   1. Respiratory distress  R06.03     2. Congestive heart failure, unspecified HF chronicity, unspecified heart failure type (HCC)  I50.9     3. Lower urinary tract infection  N39.0       Principal Problem:   NSTEMI (non-ST elevated myocardial infarction) (HCC) Active Problems:   Chronic obstructive pulmonary disease (COPD) (HCC)   Type 2 diabetes mellitus with hyperglycemia, with long-term current use of insulin (HCC)   Essential hypertension   Obesity, Class III, BMI 40-49.9 (morbid obesity) (HCC)   Acute on chronic respiratory failure with hypoxia (HCC)   Acute on chronic diastolic CHF (congestive heart failure) (HCC)   OSA (obstructive sleep apnea)   Obesity (BMI 30-39.9)   Benign prostatic hyperplasia with urinary hesitancy   CAD (coronary artery disease)   Urinary tract infection   Hypokalemia   History of kidney stones   Goals of care, counseling/discussion    Humphrey Guerreiro, FNP-C 01/17/2023 9:02 AM

## 2023-01-17 NOTE — Progress Notes (Signed)
PROGRESS NOTE    Cody Alexander  ZOX:[id number] DOB: 27-Feb-1956 DOA: 01/05/2023 PCP: [facility]     Brief Narrative:   This is a 67 yo male with a PMH of COPD and Chronic Systolic Diastolic CHF.  He presented to [facility] ER on 05/12 from home via EMS with c/o shortness of breath onset 05/12.  Per ER notes EMS reported pt wears 4L O2 chronically, however he has been noncompliant with his oxygen.  When EMS arrival at pts home he was noted to be diaphoretic with initial O2 sats @86 % on 4L O2.     ED Course  Upon arrival to the ER pt alert/oriented but remained diaphoretic/tachypneic requiring 15L NRB with resolution of hypoxia.  Pt placed on Bipap.  Pt reported he had some lower extremity swelling and inability to void.  CXR concerning for mild interstitial edema.  CT Renal stone study revealed no nephrolithiasis, ureterolithiasis, or obstructive uropathy but a small amount of gas present in the bladder presumably related to procedure and small bladder diverticula.  CTA Chest negative for PE or acute pulmonary parenchymal findings.  UA concerning for possible UTI.  Pt received cefepime.  He was subsequently admitted to the progressive care unit for additional workup and treatment.    05/13: Pt admitted to the progressive care unit with acute on chronic hypoxic respiratory failure secondary to acute on chronic CHF exacerbation, UTI, and NSTEMI  05/15: Pt developed acute encephalopathy and acute hypercapnic hypoxic respiratory failure overnight requiring portable Bipap.  However, pt failed Bipap developed jerking movements concerning for myoclonus requiring transfer to ICU. PCCM team consulted and pt mechanically intubated  01/09/23- patient had abnormal EKG today but repeat cardiac biomarkers seemed to be normal. He is on MV today he is not ready for SBT.  We did deliver diamox today to help with acid base disorder.  01/10/23- for SBT today, on insulin gtt, electrolytes are improved.  Reducing steroids today.  01/11/23- failed SBT today. >100k colonies of ecoli on Urine culture. BP is low today , have dcd aldactone and flomax. Continuing rocephin, unable to liberate from MV today. 01/12/23- PRVC weaned to 50%, secretions improved.  5/20 severe hypoxia, failure to wean from vent 5/21 severe COPD, hypoxia-failure to wean from vent 5/22 severe COPD, severe Hypoxia 5/23 extubated  Assessment & Plan:   Principal Problem:   NSTEMI (non-ST elevated myocardial infarction) [facility]) Active Problems:   Acute on chronic diastolic CHF (congestive heart failure) (HCC)   Acute on chronic respiratory failure with hypoxia (HCC)   CAD (coronary artery disease)   Urinary tract infection   Chronic obstructive pulmonary disease (COPD) (HCC)   Essential hypertension   Type 2 diabetes mellitus with hyperglycemia, with long-term current use of insulin (HCC)   Obesity (BMI 30-39.9)   Obesity, Class III, BMI 40-49.9 (morbid obesity) (HCC)   OSA (obstructive sleep apnea)   Benign prostatic hyperplasia with urinary hesitancy   Hypokalemia   History of kidney stones   Goals of care, counseling/discussion  # Acute on chronic hypercapnic respiratory failure 2/2 copd, hfpef, possible hcap. On 4 liters at home. Now weaned to 4 liters, extubated yesterday - maintain o2, monitor  # COPD With severe exacerbation. Improving - will continue methylpred 40 iv qd until cleared to take by mouth, then will start prednisone taper - cont cefepime as below - continue duonebs and other breathing treatments  # HCAP Left base infiltrate/collapse with small effusion on cxr 5/20, started on cefepime 5/21. Appears  to be improving - continue cefepime 7 days through 5/28  # HFrecoveredEF with acute exacerbation History noncompliance. Frequent admissions here and [facility] for this. Cardiology following and managing. Home bumex, lasix, lisinopril, isordil, metoprolol are all on hold. - cardiology to see today, will  see about resuming some of those meds  # Mild tropinemia Cardiology following, per their note are consider cath  # OSA - cpap qhs  # T2DM Glucose currently appropriate - SSI q4 while NPO  # Dysphagia Post-extubation swallow eval pending - npo for the time being  # Hypernatremia 2/2 lack of access to free water. 149 today - will need to start fluids if not cleared to swallow today    DVT prophylaxis: lovenox Code Status: DNI (see palliative consult note 5/23) Family Communication: none @ bedside  Level of care: Stepdown Status is: Inpatient Remains inpatient appropriate because: severity of illness    Consultants:  cardiology  Procedures: intubation  Antimicrobials:  Cefepime as above    Subjective: No complaints this morning. Denies chest pain or sob  Objective: Vitals:   01/17/23 0400 01/17/23 0500 01/17/23 0600 01/17/23 0755  BP: (!) 144/92 (!) 142/91 (!) 151/95   Pulse: 80 79 75   Resp: 18 19 19    Temp: 98.8 F (37.1 C)     TempSrc: Axillary     SpO2: 92% 96% 96% 99%  Weight:      Height:        Intake/Output Summary (Last 24 hours) at 01/17/2023 0840 Last data filed at 01/17/2023 0600 Gross per 24 hour  Intake 1130.62 ml  Output 2550 ml  Net -1419.38 ml   Filed Weights   01/14/23 0400 01/15/23 0500 01/16/23 0300  Weight: (!) 147.6 kg (!) 148.4 kg (!) 147.5 kg    Examination:  General exam: Appears calm and comfortable  Respiratory system: few scattered rhonchi, rales right base Cardiovascular system: S1 & S2 heard, RRR. Soft systolic murmur. Distant heart sounds Gastrointestinal system: Abdomen is obese, soft and nontender. No organomegaly or masses felt. Normal bowel sounds heard. Central nervous system: Alert and oriented. Moving all 4 Extremities: Symmetric 5 x 5 power. Trace LE edema Skin: No rashes, lesions or ulcers Psychiatry: grumpy    Data Reviewed: I have personally reviewed following labs and imaging  studies  CBC: Recent Labs  Lab 01/12/23 0448 01/14/23 0444 01/15/23 0658 01/16/23 0442 01/17/23 0611  WBC 12.2* 10.5 16.4* 8.3 10.6*  HGB 12.6* 12.8* 13.0 12.3* 13.2  HCT 40.2 42.0 43.0 41.2 43.7  MCV 91.6 94.0 94.5 94.3 93.4  PLT 251 281 341 242 303   Basic Metabolic Panel: Recent Labs  Lab 01/12/23 0448 01/14/23 0444 01/15/23 0658 01/16/23 0442 01/17/23 0611  NA 141 145 151* 148* 149*  K 4.8 5.1 5.0 4.3 4.1  CL 106 108 114* 114* 115*  CO2 30 30 30 26 27   GLUCOSE 221* 153* 202* 132* 132*  BUN 59* 61* 52* 47* 35*  CREATININE 1.06 0.98 0.99 0.85 0.87  CALCIUM 8.8* 8.5* 9.0 8.7* 8.9  MG  --  3.2* 2.7* 2.7*  --    GFR: Estimated Creatinine Clearance: 126.3 mL/min (by C-G formula based on SCr of 0.87 mg/dL). Liver Function Tests: No results for input(s): "AST", "ALT", "ALKPHOS", "BILITOT", "PROT", "ALBUMIN" in the last 168 hours. No results for input(s): "LIPASE", "AMYLASE" in the last 168 hours. No results for input(s): "AMMONIA" in the last 168 hours. Coagulation Profile: No results for input(s): "INR", "PROTIME" in the last 168  hours. Cardiac Enzymes: No results for input(s): "CKTOTAL", "CKMB", "CKMBINDEX", "TROPONINI" in the last 168 hours. BNP (last 3 results) No results for input(s): "PROBNP" in the last 8760 hours. HbA1C: No results for input(s): "HGBA1C" in the last 72 hours. CBG: Recent Labs  Lab 01/16/23 1607 01/16/23 1914 01/16/23 2323 01/17/23 0313 01/17/23 0719  GLUCAP 173* 137* 106* 112* 146*   Lipid Profile: No results for input(s): "CHOL", "HDL", "LDLCALC", "TRIG", "CHOLHDL", "LDLDIRECT" in the last 72 hours. Thyroid Function Tests: No results for input(s): "TSH", "T4TOTAL", "FREET4", "T3FREE", "THYROIDAB" in the last 72 hours. Anemia Panel: No results for input(s): "VITAMINB12", "FOLATE", "FERRITIN", "TIBC", "IRON", "RETICCTPCT" in the last 72 hours. Urine analysis:    Component Value Date/Time   COLORURINE AMBER (A) 01/05/2023 1652    APPEARANCEUR CLOUDY (A) 01/05/2023 1652   APPEARANCEUR Clear 08/20/2013 0849   LABSPEC 1.020 01/05/2023 1652   LABSPEC 1.014 08/20/2013 0849   PHURINE 5.5 01/05/2023 1652   GLUCOSEU 500 (A) 01/05/2023 1652   GLUCOSEU Negative 08/20/2013 0849   HGBUR LARGE (A) 01/05/2023 1652   BILIRUBINUR NEGATIVE 01/05/2023 1652   BILIRUBINUR Negative 08/20/2013 0849   KETONESUR TRACE (A) 01/05/2023 1652   PROTEINUR >300 (A) 01/05/2023 1652   NITRITE NEGATIVE 01/05/2023 1652   LEUKOCYTESUR MODERATE (A) 01/05/2023 1652   LEUKOCYTESUR Negative 08/20/2013 0849   Sepsis Labs: @LABRCNTIP (procalcitonin:4,lacticidven:4)  ) Recent Results (from the past 240 hour(s))  Culture, Respiratory w Gram Stain     Status: None   Collection Time: 01/08/23  4:29 PM   Specimen: Tracheal Aspirate; Respiratory  Result Value Ref Range Status   Specimen Description   Final    TRACHEAL ASPIRATE Performed at [facility], [address] [place], [place] [address]    Special Requests   Final    NONE Performed at [facility], [address] [place], [place] [address]    Gram Stain   Final    ABUNDANT WBC PRESENT,BOTH PMN AND MONONUCLEAR RARE GRAM POSITIVE COCCI IN PAIRS    Culture   Final    FEW Normal respiratory flora-no Staph aureus or Pseudomonas seen Performed at [facility], [address], [place], [place] [address]    Report Status 01/11/2023 FINAL  Final  Culture, Respiratory w Gram Stain     Status: None (Preliminary result)   Collection Time: 01/14/23  5:28 PM   Specimen: Tracheal Aspirate; Respiratory  Result Value Ref Range Status   Specimen Description   Final    TRACHEAL ASPIRATE Performed at [facility], [address] [place], [place] [address]    Special Requests   Final    Immunocompromised Performed at [facility], [address] [place], [place] [address]    Gram Stain   Final    RARE WBC PRESENT, PREDOMINANTLY PMN NO ORGANISMS  SEEN    Culture   Final    CULTURE REINCUBATED FOR BETTER GROWTH Performed at [facility], [address] [place], [place] [address]    Report Status PENDING  Incomplete         Radiology Studies: No results found.      Scheduled Meds:  budesonide (PULMICORT) nebulizer solution  0.25 mg Nebulization BID   Chlorhexidine Gluconate Cloth  6 each Topical Daily   enoxaparin (LOVENOX) injection  70 mg Subcutaneous Q24H   insulin aspart  0-20 Units Subcutaneous Q4H   ipratropium-albuterol  3 mL Nebulization Q6H   Continuous Infusions:  sodium chloride 10 mL/hr  at 01/17/23 0500   ceFEPime (MAXIPIME) IV Stopped (01/17/23 0242)     LOS: 12 days     Silvano Bilis, MD [facility]   If 7PM-7AM, please contact night-coverage [email or web address] Password [email or web address] 01/17/2023, 8:40 AM

## 2023-01-17 NOTE — Progress Notes (Signed)
Nutrition Follow-up  DOCUMENTATION CODES:   Morbid obesity  INTERVENTION:   -Ensure Enlive po BID, each supplement provides 350 kcal and 20 grams of protein -MVI with minerals daily -Magic cup TID with meals, each supplement provides 290 kcal and 9 grams of protein  -Feeding assistance with meals  NUTRITION DIAGNOSIS:   Inadequate oral intake related to inability to eat (pt sedated and ventilated) as evidenced by NPO status.  Progressing; advanced to PO diet on 01/17/23  GOAL:   Patient will meet greater than or equal to 90% of their needs  Progressing   MONITOR:   Diet advancement  REASON FOR ASSESSMENT:   Ventilator    ASSESSMENT:   67 y/o male with h/o COPD, CHF, HTN, CAD, HTN, MI, substance abuse and DM who is admitted with possible NSTEMI, CHF exacerbation, UTI, AKI, rhabdomyolysis and AMS.  5/23- extubated  5/24- s/p BSE- advanced to dysphagia 3 diet with thin liquids  Reviewed I/O's: -1.5 L x 24 hours and -4.3 L since admission  UOP: 2.8 L x 24 hours  Pt just advanced to PO diet. Pt with increased nutritional needs to promote healing. Suspect poor oral intake secondary to prolonged ventilator dependence. Pt would greatly benefit from addition of oral nutrition supplements.    Medications reviewed and include solu-medrol.   Labs reviewed: CBGS: 146-163 (inpatient orders for glycemic control are 0-20 units insulin aspart every 4 hours).    Diet Order:   Diet Order             DIET DYS 3 Room service appropriate? Yes with Assist; Fluid consistency: Thin  Diet effective now                   EDUCATION NEEDS:   No education needs have been identified at this time  Skin:  Skin Assessment: Reviewed RN Assessment  Last BM:  01/16/23 (type 6)  Height:   Ht Readings from Last 1 Encounters:  01/05/23 6\' 1"  (1.854 m)    Weight:   Wt Readings from Last 1 Encounters:  01/16/23 (!) 147.5 kg    Ideal Body Weight:  83.6 kg  BMI:  Body mass  index is 42.9 kg/m.  Estimated Nutritional Needs:   Kcal:  2300-2500  Protein:  125-150 grams  Fluid:  2.0-2.2 L    Levada Schilling, RD, LDN, CDCES Registered Dietitian II Certified Diabetes Care and Education Specialist Please refer to [facility] for RD and/or RD on-call/weekend/after hours pager

## 2023-01-17 NOTE — Progress Notes (Signed)
PHARMACY CONSULT NOTE  Pharmacy Consult for Electrolyte Monitoring and Replacement   Recent Labs: Potassium (mmol/L)  Date Value  01/17/2023 4.1  08/20/2013 3.7   Magnesium (mg/dL)  Date Value  78/29/5621 2.7 (H)   Calcium (mg/dL)  Date Value  30/86/5784 8.9   Calcium, Total (mg/dL)  Date Value  69/62/9528 9.1   Albumin (g/dL)  Date Value  41/32/4401 3.6  08/20/2013 3.7   Phosphorus (mg/dL)  Date Value  02/72/5366 2.7   Sodium (mmol/L)  Date Value  01/17/2023 149 (H)  08/20/2013 134 (L)     Assessment: 67 y/o male with h/o COPD, CHF, HTN, CAD, HTN, MI, substance abuse and DM who is admitted with possible NSTEMI, CHF exacerbation, UTI, AKI, rhabdomyolysis and AMS    Goal of Therapy:  Electrolytes WNL  Plan:  ---no electrolyte replacement warranted for today ---recheck electrolytes in am  Lowella Bandy ,PharmD Clinical Pharmacist 01/17/2023 7:13 AM

## 2023-01-17 NOTE — Progress Notes (Addendum)
Very manipulating today. Insisted he get ice,water,etc and ask anyone who came in his room. Explained several times that until he was cleared by the hospital's speech therapist he could not have unlimited ice chips or any fluids by mouth. He was to have a few ice chips to wet his mouth and practice swallowing. Patient's voice was hoarse with frequent coughing. Stood by bed with PT today x 3. Encouraged to exercise hands and feet. Grips remain weak. 1825 Report called to [facility] RN charge nurse on 2A.  1840 Transferred to room 235 via bed.

## 2023-01-17 NOTE — Evaluation (Signed)
Clinical/Bedside Swallow Evaluation Patient Details  Name: Cody Alexander MRN: [id number] Date of Birth: 28-Jan-1956  Today's Date: 01/17/2023 Time: SLP Start Time (ACUTE ONLY): 1430 SLP Stop Time (ACUTE ONLY): 1530 SLP Time Calculation (min) (ACUTE ONLY): 60 min  Past Medical History:  Past Medical History:  Diagnosis Date   CHF (congestive heart failure) (HCC)    COPD (chronic obstructive pulmonary disease) (HCC)    Diabetes mellitus without complication (HCC)    Hypertension    Neuropathy    Past Surgical History:  Past Surgical History:  Procedure Laterality Date   LEFT HEART CATH AND CORONARY ANGIOGRAPHY Right 02/27/2018   Procedure: LEFT HEART CATH AND CORONARY ANGIOGRAPHY;  Surgeon: Laurier Nancy, MD;  Location: [facility] INVASIVE CV LAB;  Service: Cardiovascular;  Laterality: Right;   HPI:  This is a 67 yo male with a PMH of Obesity, COPD and Chronic Systolic Diastolic CHF, OSA, neuropathy, Hom eO2 4L, substance abuse.  He presented to [facility] ER on 05/12 from home via EMS with c/o shortness of breath onset 05/12.  Per ER notes EMS reported pt wears 4L O2 chronically, however he has been noncompliant with his oxygen.  When EMS arrival at pts home he was noted to be diaphoretic with initial O2 sats @86 % on 4L O2.       ED Course   Upon arrival to the ER pt alert/oriented but remained diaphoretic/tachypneic requiring 15L NRB with resolution of hypoxia.  Pt placed on Bipap.  Pt reported he had some lower extremity swelling and inability to void.  CXR concerning for mild interstitial edema.  Pt developed acute encephalopathy and acute hypercapnic hypoxic respiratory failure overnight requiring portable Bipap.  However, pt failed Bipap developed jerking movements concerning for myoclonus requiring transfer to ICU. PCCM team consulted and pt mechanically intubated; extubation on 01/16/2023.    Assessment / Plan / Recommendation  Clinical Impression   Pt seen for BSE today. Pt awake, oriented  x3. Pt was verbal but w/ dysphonia, weak cough. He follows commands appropriately. Pt was extubated this morning s/p ~8 days of intubation. Noted weak UEs bilat.  On Reserve O2 support 4L, afebrile. WBC 10.6.  Pt appears to present w/ grossly functional oropharyngeal phase swallowing w/ the limited po trials given in setting of lengthy oral intubation (~8 days) and extubation this morning. No overt oropharyngeal phase dysphagia noted w/ the trials given(ice chips, Puree- applesauce; no overt neuromuscular deficits noted. Pt consumed po trials w/ No immediate, overt clinical s/s of aspiration during po trials.  Pt appears at reduced risk for aspiration w/ the limited trial consistencies following general aspiration precautions and w/ Supervision. Recommend these po trials in order to engage oropharyngeal swallowing while lessening risk for aspiration by limiting po intake at this time secondary to the lengthy oral intubation.  Pt does have challenging factors that could impact oropharyngeal swallowing to include min decreased mental status awareness and lengthy oral intubation, fatigue/weakness, and deconditioning hampering his UE movements and self-feeding abilities. These factors can increase risk for aspiration, dysphagia as well as decreased oral intake overall.   During po trials, pt consumed consistencies given w/ no overt coughing, decline in vocal quality, or change in respiratory presentation during/post trials. O2 sats remained 94-95%. Oral phase appeared [facility] w/ timely bolus management, mastication, and control of bolus propulsion for A-P transfer for swallowing. Oral clearing achieved w/ trial consistencies.  OM Exam appeared [facility] w/ no unilateral weakness noted. Speech intelligible. Noted apparent THRUSH(MD made aware for tx).  Pt required support w/ feeding.   Recommend therapeutic trials of Single ice chips, Applesauce(puree) s/o oral care. Recommend general aspiration precautions, Supervision by  NSG w/ po intake. Pills CRUSHED in Puree for safer, easier swallowing -- if needing oral medications. Frequent oral care for hygiene and stimulation of swallowing. Education given on general aspiration precautions to pt; therapeutic trials at this time. MD/NSG updated, agreed. ST services will f/u w/ ongoing assessment of swallowing per POC. Recommend Dietician f/u for support. ADDENDUM: late entry SLP Visit Diagnosis: Dysphagia, unspecified (R13.10)    Aspiration Risk  Mild aspiration risk;Risk for inadequate nutrition/hydration (s/p extubation)    Diet Recommendation   therapeutic trials of Single ice chips, Applesauce(puree) s/o oral care. Recommend general aspiration precautions, Supervision by NSG w/ po intake.  Medication Administration: Crushed with puree    Other  Recommendations Recommended Consults:  (Dietician f/u) Oral Care Recommendations: Oral care BID;Oral care prior to ice chip/H20;Staff/trained caregiver to provide oral care    Recommendations for follow up therapy are one component of a multi-disciplinary discharge planning process, led by the attending physician.  Recommendations may be updated based on patient status, additional functional criteria and insurance authorization.  Follow up Recommendations Follow physician's recommendations for discharge plan and follow up therapies      Assistance Recommended at Discharge  full  Functional Status Assessment Patient has had a recent decline in their functional status and demonstrates the ability to make significant improvements in function in a reasonable and predictable amount of time.  Frequency and Duration min 2x/week  2 weeks       Prognosis Prognosis for improved oropharyngeal function: Fair (-Good) Barriers to Reach Goals: Time post onset;Severity of deficits Barriers/Prognosis Comment: insight?      Swallow Study   General Date of Onset: 01/05/23 HPI: This is a 67 yo male with a PMH of Obesity, COPD and  Chronic Systolic Diastolic CHF, OSA, neuropathy, Hom eO2 4L, substance abuse.  He presented to [facility] ER on 05/12 from home via EMS with c/o shortness of breath onset 05/12.  Per ER notes EMS reported pt wears 4L O2 chronically, however he has been noncompliant with his oxygen.  When EMS arrival at pts home he was noted to be diaphoretic with initial O2 sats @86 % on 4L O2.       ED Course   Upon arrival to the ER pt alert/oriented but remained diaphoretic/tachypneic requiring 15L NRB with resolution of hypoxia.  Pt placed on Bipap.  Pt reported he had some lower extremity swelling and inability to void.  CXR concerning for mild interstitial edema.  Pt developed acute encephalopathy and acute hypercapnic hypoxic respiratory failure overnight requiring portable Bipap.  However, pt failed Bipap developed jerking movements concerning for myoclonus requiring transfer to ICU. PCCM team consulted and pt mechanically intubated; extubation on 01/16/2023. Type of Study: Bedside Swallow Evaluation Previous Swallow Assessment: none Diet Prior to this Study: NPO (regular diet at home) Temperature Spikes Noted: No (wbc 10.6) Respiratory Status: Nasal cannula (4L) History of Recent Intubation: Yes Total duration of intubation (days): 8 days Date extubated: 01/16/23 Behavior/Cognition: Alert;Cooperative;Pleasant mood;Distractible;Requires cueing (intermittent) Oral Cavity Assessment: Dry (appearance of THRUSH) Oral Care Completed by SLP: Yes Oral Cavity - Dentition: Adequate natural dentition (few missing) Vision: Functional for self-feeding Self-Feeding Abilities: Able to feed self;Needs set up;Needs assist (weak UEs) Patient Positioning: Upright in bed (needed positioning) Baseline Vocal Quality: Aphonic;Low vocal intensity (s/p intubation) Volitional Cough: Weak Volitional Swallow: Able to elicit  Oral/Motor/Sensory Function Overall Oral Motor/Sensory Function: Within functional limits   Ice Chips Ice chips:  Within functional limits Presentation: Spoon (fed, 10 chips)   Thin Liquid Thin Liquid: Not tested    Nectar Thick Nectar Thick Liquid: Not tested   Honey Thick Honey Thick Liquid: Not tested   Puree Puree: Within functional limits Presentation: Spoon (fed, 10 trials)   Solid     Solid: Not tested        Jerilynn Som, MS, CCC-SLP Speech Language Pathologist Rehab Services; [facility] [phone] (ascom) Beverly Suriano 01/17/2023,8:20 AM

## 2023-01-18 DIAGNOSIS — I214 Non-ST elevation (NSTEMI) myocardial infarction: Secondary | ICD-10-CM | POA: Diagnosis not present

## 2023-01-18 LAB — BASIC METABOLIC PANEL
Anion gap: 8 (ref 5–15)
BUN: 29 mg/dL — ABNORMAL HIGH (ref 8–23)
CO2: 28 mmol/L (ref 22–32)
Calcium: 8.7 mg/dL — ABNORMAL LOW (ref 8.9–10.3)
Chloride: 107 mmol/L (ref 98–111)
Creatinine, Ser: 0.74 mg/dL (ref 0.61–1.24)
GFR, Estimated: >60 mL/min (ref >60)
Glucose, Bld: 133 mg/dL — ABNORMAL HIGH (ref 70–99)
Potassium: 3.8 mmol/L (ref 3.5–5.1)
Sodium: 143 mmol/L (ref 135–145)

## 2023-01-18 LAB — GLUCOSE, CAPILLARY
Glucose-Capillary: 127 mg/dL — ABNORMAL HIGH (ref 70–99)
Glucose-Capillary: 147 mg/dL — ABNORMAL HIGH (ref 70–99)
Glucose-Capillary: 149 mg/dL — ABNORMAL HIGH (ref 70–99)
Glucose-Capillary: 184 mg/dL — ABNORMAL HIGH (ref 70–99)
Glucose-Capillary: 213 mg/dL — ABNORMAL HIGH (ref 70–99)
Glucose-Capillary: 214 mg/dL — ABNORMAL HIGH (ref 70–99)

## 2023-01-18 MED ORDER — BUMETANIDE 1 MG PO TABS
3.0000 mg | ORAL_TABLET | Freq: Every day | ORAL | Status: DC
  Administered 2023-01-18 – 2023-01-21 (×4): 3 mg via ORAL
  Filled 2023-01-18 (×4): qty 3

## 2023-01-18 MED ORDER — PREDNISONE 20 MG PO TABS
30.0000 mg | ORAL_TABLET | ORAL | Status: AC
  Administered 2023-01-19: 30 mg via ORAL
  Filled 2023-01-18: qty 1

## 2023-01-18 MED ORDER — LOPERAMIDE HCL 2 MG PO CAPS
2.0000 mg | ORAL_CAPSULE | ORAL | Status: DC | PRN
  Administered 2023-01-18 – 2023-01-21 (×8): 2 mg via ORAL
  Filled 2023-01-18 (×8): qty 1

## 2023-01-18 MED ORDER — INSULIN ASPART 100 UNIT/ML IJ SOLN
0.0000 [IU] | Freq: Three times a day (TID) | INTRAMUSCULAR | Status: DC
  Administered 2023-01-18: 7 [IU] via SUBCUTANEOUS
  Administered 2023-01-19 (×2): 4 [IU] via SUBCUTANEOUS
  Administered 2023-01-19: 7 [IU] via SUBCUTANEOUS
  Administered 2023-01-20: 4 [IU] via SUBCUTANEOUS
  Administered 2023-01-20: 3 [IU] via SUBCUTANEOUS
  Administered 2023-01-20: 7 [IU] via SUBCUTANEOUS
  Filled 2023-01-18 (×7): qty 1

## 2023-01-18 MED ORDER — INSULIN ASPART 100 UNIT/ML IJ SOLN: 0.0000 [IU] | Freq: Every day | INTRAMUSCULAR | Status: DC

## 2023-01-18 MED ORDER — GUAIFENESIN-DM 100-10 MG/5ML PO SYRP
15.0000 mL | ORAL_SOLUTION | ORAL | Status: DC | PRN
  Administered 2023-01-18 – 2023-01-19 (×4): 15 mL via ORAL
  Filled 2023-01-18 (×4): qty 20

## 2023-01-18 NOTE — Progress Notes (Signed)
SLP Cancellation Note  Patient Details Name: Cody Alexander MRN: [id number] DOB: 1955/11/09   Cancelled treatment:       Reason Eval/Treat Not Completed: Other (comment) (in patient care)   Dayron Odland 01/18/2023, 11:26 AM

## 2023-01-18 NOTE — Progress Notes (Signed)
[facility] Cardiology    SUBJECTIVE: States to feel somewhat better less shortness of breath no chest pain on supplemental oxygen only still feels weak has not gotten out of bed has had some physical therapy which she states is very difficult any sore   Vitals:   01/18/23 0028 01/18/23 0227 01/18/23 0427 01/18/23 0928  BP: (!) 147/91  (!) 148/93 139/78  Pulse: 69  66 83  Resp: 19  20 18   Temp: 97.9 F (36.6 C)  98 F (36.7 C) (!) 97.5 F (36.4 C)  TempSrc:      SpO2: 95% 95% 95% 100%  Weight:   (!) 139.7 kg   Height:         Intake/Output Summary (Last 24 hours) at 01/18/2023 0955 Last data filed at 01/18/2023 0427 Gross per 24 hour  Intake 200 ml  Output 1100 ml  Net -900 ml      PHYSICAL EXAM  General: Well developed, well nourished, in no acute distress HEENT:  Normocephalic and atramatic Neck:  No JVD.  Lungs: Clear bilaterally to auscultation and percussion. Heart: HRRR . Normal S1 and S2 without gallops or murmurs.  Abdomen: Bowel sounds are positive, abdomen soft and non-tender  Msk:  Back normal, normal gait. Normal strength and tone for age. Extremities: No clubbing, cyanosis or edema.   Neuro: Alert and oriented X 3. Psych:  Good affect, responds appropriately   LABS: Basic Metabolic Panel: Recent Labs    01/16/23 0442 01/17/23 0611 01/18/23 0422  NA 148* 149* 143  K 4.3 4.1 3.8  CL 114* 115* 107  CO2 26 27 28   GLUCOSE 132* 132* 133*  BUN 47* 35* 29*  CREATININE 0.85 0.87 0.74  CALCIUM 8.7* 8.9 8.7*  MG 2.7*  --   --    Liver Function Tests: No results for input(s): "AST", "ALT", "ALKPHOS", "BILITOT", "PROT", "ALBUMIN" in the last 72 hours. No results for input(s): "LIPASE", "AMYLASE" in the last 72 hours. CBC: Recent Labs    01/16/23 0442 01/17/23 0611  WBC 8.3 10.6*  HGB 12.3* 13.2  HCT 41.2 43.7  MCV 94.3 93.4  PLT 242 303   Cardiac Enzymes: No results for input(s): "CKTOTAL", "CKMB", "CKMBINDEX", "TROPONINI" in the last 72  hours. BNP: Invalid input(s): "POCBNP" D-Dimer: No results for input(s): "DDIMER" in the last 72 hours. Hemoglobin A1C: No results for input(s): "HGBA1C" in the last 72 hours. Fasting Lipid Panel: No results for input(s): "CHOL", "HDL", "LDLCALC", "TRIG", "CHOLHDL", "LDLDIRECT" in the last 72 hours. Thyroid Function Tests: No results for input(s): "TSH", "T4TOTAL", "T3FREE", "THYROIDAB" in the last 72 hours.  Invalid input(s): "FREET3" Anemia Panel: No results for input(s): "VITAMINB12", "FOLATE", "FERRITIN", "TIBC", "IRON", "RETICCTPCT" in the last 72 hours.  No results found.   Echo preserved left ventricular function EF of at least 65%  TELEMETRY: Normal sinus rhythm rate of 65 nonspecific ST-T changes:  ASSESSMENT AND PLAN:  Principal Problem:   NSTEMI (non-ST elevated myocardial infarction) (HCC) Active Problems:   Chronic obstructive pulmonary disease (COPD) (HCC)   Type 2 diabetes mellitus with hyperglycemia, with long-term current use of insulin (HCC)   Essential hypertension   Obesity, Class III, BMI 40-49.9 (morbid obesity) (HCC)   Acute on chronic respiratory failure with hypoxia (HCC)   Acute on chronic diastolic CHF (congestive heart failure) (HCC)   OSA (obstructive sleep apnea)   Obesity (BMI 30-39.9)   Benign prostatic hyperplasia with urinary hesitancy   CAD (coronary artery disease)   Urinary tract infection  Hypokalemia   History of kidney stones   Goals of care, counseling/discussion    Plan Respiratory failure improved recently extubated continue supplemental oxygen Congestive heart failure chronic stable continue diuresis with Non-STEMI elevated troponins no active chest pain no ischemia consider ischemic workup in future Diabetes type 2 uncomplicated continue current medical therapy Hypertension management and control Obesity recommend significant weight loss discussed portion control Obstructive sleep apnea sleep study CPAP weight  loss Continue therapy for urinary tract infection Correct electrolytes Continue conservative management for now Recommend rehab physical therapy  Alwyn Pea, MD 01/18/2023 9:55 AM

## 2023-01-18 NOTE — Progress Notes (Signed)
PROGRESS NOTE    Cody Alexander  SAY:[id number] DOB: 14-Nov-1955 DOA: 01/05/2023 PCP: [facility]     Brief Narrative:   This is a 67 yo male with a PMH of COPD and Chronic Systolic Diastolic CHF.  He presented to [facility] ER on 05/12 from home via EMS with c/o shortness of breath onset 05/12.  Per ER notes EMS reported pt wears 4L O2 chronically, however he has been noncompliant with his oxygen.  When EMS arrival at pts home he was noted to be diaphoretic with initial O2 sats @86 % on 4L O2.     ED Course  Upon arrival to the ER pt alert/oriented but remained diaphoretic/tachypneic requiring 15L NRB with resolution of hypoxia.  Pt placed on Bipap.  Pt reported he had some lower extremity swelling and inability to void.  CXR concerning for mild interstitial edema.  CT Renal stone study revealed no nephrolithiasis, ureterolithiasis, or obstructive uropathy but a small amount of gas present in the bladder presumably related to procedure and small bladder diverticula.  CTA Chest negative for PE or acute pulmonary parenchymal findings.  UA concerning for possible UTI.  Pt received cefepime.  He was subsequently admitted to the progressive care unit for additional workup and treatment.    05/13: Pt admitted to the progressive care unit with acute on chronic hypoxic respiratory failure secondary to acute on chronic CHF exacerbation, UTI, and NSTEMI  05/15: Pt developed acute encephalopathy and acute hypercapnic hypoxic respiratory failure overnight requiring portable Bipap.  However, pt failed Bipap developed jerking movements concerning for myoclonus requiring transfer to ICU. PCCM team consulted and pt mechanically intubated  01/09/23- patient had abnormal EKG today but repeat cardiac biomarkers seemed to be normal. He is on MV today he is not ready for SBT.  We did deliver diamox today to help with acid base disorder.  01/10/23- for SBT today, on insulin gtt, electrolytes are improved.  Reducing steroids today.  01/11/23- failed SBT today. >100k colonies of ecoli on Urine culture. BP is low today , have dcd aldactone and flomax. Continuing rocephin, unable to liberate from MV today. 01/12/23- PRVC weaned to 50%, secretions improved.  5/20 severe hypoxia, failure to wean from vent 5/21 severe COPD, hypoxia-failure to wean from vent 5/22 severe COPD, severe Hypoxia 5/23 extubated  Assessment & Plan:   Principal Problem:   NSTEMI (non-ST elevated myocardial infarction) [facility]) Active Problems:   Acute on chronic diastolic CHF (congestive heart failure) (HCC)   Acute on chronic respiratory failure with hypoxia (HCC)   CAD (coronary artery disease)   Urinary tract infection   Chronic obstructive pulmonary disease (COPD) (HCC)   Essential hypertension   Type 2 diabetes mellitus with hyperglycemia, with long-term current use of insulin (HCC)   Obesity (BMI 30-39.9)   Obesity, Class III, BMI 40-49.9 (morbid obesity) (HCC)   OSA (obstructive sleep apnea)   Benign prostatic hyperplasia with urinary hesitancy   Hypokalemia   History of kidney stones   Goals of care, counseling/discussion  # Acute on chronic hypercapnic respiratory failure 2/2 copd, hfpef, possible hcap. On 4 liters at home. Extubated 4/23. Today up to 8 liters but patient reports his breathing is improving, no tachypnea, and sat is 100%, suspect this can be weaned further.   - maintain o2, wean  # COPD With severe exacerbation. Improving - s/p methylpred, now prednisone taper, would plan on another 30 tomorrow, then 20 for 2 days, 10 for 2 days, discontinue - cont cefepime as  below - continue duonebs and other breathing treatments  # HCAP Left base infiltrate/collapse with small effusion on cxr 5/20, started on cefepime 5/21. Appears to be improving - continue cefepime 7 days through 5/28  # HFrecoveredEF with acute exacerbation History noncompliance. Frequent admissions here and [facility] for this. Cardiology  following and managing. Home bumex, lasix isordil, metoprolol are all on hold. - I will re-start bumex today - will hold lisinopril in anticipation of this  # Mild tropinemia Cardiology following, per their note conservative mgmt for now  # OSA - cpap qhs  # T2DM Glucose currently appropriate - SSI 0-20  # Dysphagia Slp has cleared for dysphagia 3 diet  # Hypernatremia 2/2 lack of access to free water. Resolved with diet  # Debility PT advising SNF, TOC is working on this   DVT prophylaxis: lovenox Code Status: DNI (see palliative consult note 5/23) Family Communication: none @ bedside  Level of care: Telemetry Cardiac Status is: Inpatient Remains inpatient appropriate because: severity of illness    Consultants:  cardiology  Procedures: intubation  Antimicrobials:  Cefepime as above    Subjective: No complaints this morning. Denies chest pain or sob  Objective: Vitals:   01/18/23 0028 01/18/23 0227 01/18/23 0427 01/18/23 0928  BP: (!) 147/91  (!) 148/93 139/78  Pulse: 69  66 83  Resp: 19  20 18   Temp: 97.9 F (36.6 C)  98 F (36.7 C) (!) 97.5 F (36.4 C)  TempSrc:      SpO2: 95% 95% 95% 100%  Weight:   (!) 139.7 kg   Height:        Intake/Output Summary (Last 24 hours) at 01/18/2023 1259 Last data filed at 01/18/2023 1040 Gross per 24 hour  Intake 440 ml  Output 1100 ml  Net -660 ml   Filed Weights   01/15/23 0500 01/16/23 0300 01/18/23 0427  Weight: (!) 148.4 kg (!) 147.5 kg (!) 139.7 kg    Examination:  General exam: Appears calm and comfortable  Respiratory system: few scattered rhonchi, rales right base Cardiovascular system: S1 & S2 heard, RRR. Soft systolic murmur. Distant heart sounds Gastrointestinal system: Abdomen is obese, soft and nontender. No organomegaly or masses felt. Normal bowel sounds heard. Central nervous system: Alert and oriented. Moving all 4 Extremities: Symmetric 5 x 5 power. Trace LE edema Skin: No rashes,  lesions or ulcers Psychiatry: grumpy    Data Reviewed: I have personally reviewed following labs and imaging studies  CBC: Recent Labs  Lab 01/12/23 0448 01/14/23 0444 01/15/23 0658 01/16/23 0442 01/17/23 0611  WBC 12.2* 10.5 16.4* 8.3 10.6*  HGB 12.6* 12.8* 13.0 12.3* 13.2  HCT 40.2 42.0 43.0 41.2 43.7  MCV 91.6 94.0 94.5 94.3 93.4  PLT 251 281 341 242 303   Basic Metabolic Panel: Recent Labs  Lab 01/14/23 0444 01/15/23 0658 01/16/23 0442 01/17/23 0611 01/18/23 0422  NA 145 151* 148* 149* 143  K 5.1 5.0 4.3 4.1 3.8  CL 108 114* 114* 115* 107  CO2 30 30 26 27 28   GLUCOSE 153* 202* 132* 132* 133*  BUN 61* 52* 47* 35* 29*  CREATININE 0.98 0.99 0.85 0.87 0.74  CALCIUM 8.5* 9.0 8.7* 8.9 8.7*  MG 3.2* 2.7* 2.7*  --   --    GFR: Estimated Creatinine Clearance: 133.4 mL/min (by C-G formula based on SCr of 0.74 mg/dL). Liver Function Tests: No results for input(s): "AST", "ALT", "ALKPHOS", "BILITOT", "PROT", "ALBUMIN" in the last 168 hours. No results for input(s): "  LIPASE", "AMYLASE" in the last 168 hours. No results for input(s): "AMMONIA" in the last 168 hours. Coagulation Profile: No results for input(s): "INR", "PROTIME" in the last 168 hours. Cardiac Enzymes: No results for input(s): "CKTOTAL", "CKMB", "CKMBINDEX", "TROPONINI" in the last 168 hours. BNP (last 3 results) No results for input(s): "PROBNP" in the last 8760 hours. HbA1C: No results for input(s): "HGBA1C" in the last 72 hours. CBG: Recent Labs  Lab 01/17/23 1532 01/17/23 2012 01/18/23 0030 01/18/23 0442 01/18/23 0925  GLUCAP 210* 170* 127* 149* 147*   Lipid Profile: No results for input(s): "CHOL", "HDL", "LDLCALC", "TRIG", "CHOLHDL", "LDLDIRECT" in the last 72 hours. Thyroid Function Tests: No results for input(s): "TSH", "T4TOTAL", "FREET4", "T3FREE", "THYROIDAB" in the last 72 hours. Anemia Panel: No results for input(s): "VITAMINB12", "FOLATE", "FERRITIN", "TIBC", "IRON", "RETICCTPCT"  in the last 72 hours. Urine analysis:    Component Value Date/Time   COLORURINE AMBER (A) 01/05/2023 1652   APPEARANCEUR CLOUDY (A) 01/05/2023 1652   APPEARANCEUR Clear 08/20/2013 0849   LABSPEC 1.020 01/05/2023 1652   LABSPEC 1.014 08/20/2013 0849   PHURINE 5.5 01/05/2023 1652   GLUCOSEU 500 (A) 01/05/2023 1652   GLUCOSEU Negative 08/20/2013 0849   HGBUR LARGE (A) 01/05/2023 1652   BILIRUBINUR NEGATIVE 01/05/2023 1652   BILIRUBINUR Negative 08/20/2013 0849   KETONESUR TRACE (A) 01/05/2023 1652   PROTEINUR >300 (A) 01/05/2023 1652   NITRITE NEGATIVE 01/05/2023 1652   LEUKOCYTESUR MODERATE (A) 01/05/2023 1652   LEUKOCYTESUR Negative 08/20/2013 0849   Sepsis Labs: @LABRCNTIP (procalcitonin:4,lacticidven:4)  ) Recent Results (from the past 240 hour(s))  Culture, Respiratory w Gram Stain     Status: None   Collection Time: 01/08/23  4:29 PM   Specimen: Tracheal Aspirate; Respiratory  Result Value Ref Range Status   Specimen Description   Final    TRACHEAL ASPIRATE Performed at [facility] [address], [place], [place] [address]    Special Requests   Final    NONE Performed at [facility], [address] [place], [place] [address]    Gram Stain   Final    ABUNDANT WBC PRESENT,BOTH PMN AND MONONUCLEAR RARE GRAM POSITIVE COCCI IN PAIRS    Culture   Final    FEW Normal respiratory flora-no Staph aureus or Pseudomonas seen Performed at [facility], [address] [place], [place] [address]    Report Status 01/11/2023 FINAL  Final  Culture, Respiratory w Gram Stain     Status: None   Collection Time: 01/14/23  5:28 PM   Specimen: Tracheal Aspirate; Respiratory  Result Value Ref Range Status   Specimen Description   Final    TRACHEAL ASPIRATE Performed at [facility], [address], [place], [place] [address]    Special Requests   Final    Immunocompromised Performed at [facility] [place], [address]  [place], [place] [address]    Gram Stain   Final    RARE WBC PRESENT, PREDOMINANTLY PMN NO ORGANISMS SEEN    Culture   Final    RARE Normal respiratory flora-no Staph aureus or Pseudomonas seen Performed at [facility], [address] [place], [place] [address]    Report Status 01/17/2023 FINAL  Final         Radiology Studies: No results found.      Scheduled Meds:  budesonide (PULMICORT) nebulizer solution  0.25 mg Nebulization BID   Chlorhexidine Gluconate Cloth  6 each Topical Daily   enoxaparin (LOVENOX)  injection  70 mg Subcutaneous Q24H   insulin aspart  0-20 Units Subcutaneous Q4H   ipratropium-albuterol  3 mL Nebulization Q6H   lisinopril  20 mg Oral Daily   multivitamin with minerals  1 tablet Oral Daily   Continuous Infusions:  sodium chloride 10 mL/hr at 01/18/23 0534   ceFEPime (MAXIPIME) IV 2 g (01/18/23 0536)     LOS: 13 days     Silvano Bilis, MD [facility]   If 7PM-7AM, please contact night-coverage [email or web address] Password [facility] 01/18/2023, 12:59 PM

## 2023-01-18 NOTE — [place] FL2 (Signed)
[place] MEDICAID FL2 LEVEL OF CARE FORM     IDENTIFICATION  [name] Birthdate: 08/23/1956 Sex: male Admission Date (Current Location): 01/05/2023  [facility] [place] and [place] Number:  Chiropodist and Address:  [facility], [address] [place], [place] [address]      Provider Number: [id number]  Attending Physician Name and Address:  Kathrynn Running, MD  Relative Name and Phone Number:  Oren Bracket) [phone] [facility])    Current Level of Care: Hospital Recommended Level of Care: Skilled Nursing Facility Prior Approval Number:    Date Approved/Denied:   PASRR Number: [id number]  Discharge Plan: SNF    Current Diagnoses: Patient Active Problem List   Diagnosis Date Noted   Goals of care, counseling/discussion 01/16/2023   History of kidney stones 01/06/2023   Hypokalemia 01/05/2023   Bilateral leg pain 11/10/2022   Chest pain    Syncope and collapse    Chronic back pain    Acute on chronic respiratory failure (HCC) 03/14/2022   Diabetes mellitus without complication (HCC)    Acute exacerbation of chronic low back pain    Urinary tract infection    HLD (hyperlipidemia) 01/27/2022   CAD (coronary artery disease) 01/27/2022   Fall at home, initial encounter 01/27/2022   Leg weakness, bilateral 01/27/2022   Hyperkalemia    Acute CHF (congestive heart failure) (HCC) 08/15/2021   Benign prostatic hyperplasia with urinary hesitancy    Lower abdominal pain    Elevated troponin    Rash    Uncontrolled type 2 diabetes mellitus with hyperglycemia (HCC) 01/04/2021   Acute on chronic diastolic CHF (congestive heart failure) (HCC) 01/04/2021   CHF (congestive heart failure), NYHA class I, acute, diastolic (HCC) 01/04/2021   OSA (obstructive sleep apnea) 01/04/2021   Obesity (BMI 30-39.9) 01/04/2021   Chronic respiratory failure with hypoxia (HCC) 01/04/2021   Acute on chronic congestive heart failure  (HCC)    Acute on chronic respiratory failure with hypoxia (HCC) 06/20/2020   NSTEMI (non-ST elevated myocardial infarction) (HCC) 02/25/2018   Obesity, Class III, BMI 40-49.9 (morbid obesity) (HCC) 01/19/2017   Chronic obstructive pulmonary disease (COPD) (HCC) 05/13/2014   Type 2 diabetes mellitus with hyperglycemia, with long-term current use of insulin (HCC) 05/13/2014   Essential hypertension 05/13/2014    Orientation RESPIRATION BLADDER Height & Weight     Self, Time, Situation, Place  Normal Continent Weight: (!) 139.7 kg Height:  6\' 1"  (185.4 cm)  BEHAVIORAL SYMPTOMS/MOOD NEUROLOGICAL BOWEL NUTRITION STATUS      Incontinent Diet  AMBULATORY STATUS COMMUNICATION OF NEEDS Skin   Extensive Assist Verbally Skin abrasions                       Personal Care Assistance Level of Assistance  Bathing, Dressing Bathing Assistance: Limited assistance   Dressing Assistance: Limited assistance     Functional Limitations Info             SPECIAL CARE FACTORS FREQUENCY  PT (By licensed PT), OT (By licensed OT)     PT Frequency: 5 times a week OT Frequency: 5 times a week            Contractures Contractures Info: Not present    Additional Factors Info  Code Status, Allergies Code Status Info: DNR Allergies Info: Erythromycin           Current Medications (01/18/2023):  This is the current hospital active medication list Current Facility-Administered Medications  Medication Dose Route Frequency Provider Last Rate Last Admin   0.9 %  sodium chloride infusion  250 mL Intravenous Continuous Erin Fulling, MD 10 mL/hr at 01/18/23 0534 Restarted at 01/18/23 0534   acetaminophen (TYLENOL) tablet 650 mg  650 mg Oral Q4H PRN Andris Baumann, MD   650 mg at 01/15/23 1156   albuterol (PROVENTIL) (2.5 MG/3ML) 0.083% nebulizer solution 2.5 mg  2.5 mg Nebulization Q4H PRN Andris Baumann, MD       budesonide (PULMICORT) nebulizer solution 0.25 mg  0.25 mg Nebulization BID  Lowella Bandy, RPH   0.25 mg at 01/18/23 0844   ceFEPIme (MAXIPIME) 2 g in sodium chloride 0.9 % 100 mL IVPB  2 g Intravenous Q8H Cordella Register A, RPH 200 mL/hr at 01/18/23 0536 2 g at 01/18/23 0536   Chlorhexidine Gluconate Cloth 2 % PADS 6 each  6 each Topical Daily Vida Rigger, MD   6 each at 01/17/23 1426   enoxaparin (LOVENOX) injection 70 mg  70 mg Subcutaneous Q24H Ezequiel Essex, NP   70 mg at 01/17/23 2030   guaiFENesin-dextromethorphan (ROBITUSSIN DM) 100-10 MG/5ML syrup 15 mL  15 mL Oral Q4H PRN Kathrynn Running, MD   15 mL at 01/18/23 0146   insulin aspart (novoLOG) injection 0-20 Units  0-20 Units Subcutaneous Q4H Jimmye Norman, NP   3 Units at 01/18/23 0942   ipratropium-albuterol (DUONEB) 0.5-2.5 (3) MG/3ML nebulizer solution 3 mL  3 mL Nebulization Q6H Vida Rigger, MD   3 mL at 01/18/23 0844   lisinopril (ZESTRIL) tablet 20 mg  20 mg Oral Daily Scoggins, Amber, NP   20 mg at 01/18/23 1610   loperamide (IMODIUM) capsule 2 mg  2 mg Oral PRN Kathrynn Running, MD   2 mg at 01/18/23 9604   multivitamin with minerals tablet 1 tablet  1 tablet Oral Daily Kathrynn Running, MD   1 tablet at 01/18/23 0942   nitroGLYCERIN (NITROSTAT) SL tablet 0.4 mg  0.4 mg Sublingual Q5 Min x 3 PRN Andris Baumann, MD       ondansetron [facility]) injection 4 mg  4 mg Intravenous Q6H PRN Andris Baumann, MD   4 mg at 01/07/23 1122     Discharge Medications: Please see discharge summary for a list of discharge medications.  Relevant Imaging Results:  Relevant Lab Results:   Additional Information SSN [id number]  Kemper Durie, RN

## 2023-01-18 NOTE — TOC Progression Note (Signed)
Transition of Care [facility]) - Progression Note    Patient Details  Name: Cody Alexander MRN: [id number] Date of Birth: April 15, 1956  [facility]) CM/SW Contact  Kemper Durie, RN Phone Number: 01/18/2023, 10:47 AM  Clinical Narrative:     Patient aware of recommendations for SNF for short term rehab, no preference.  FL2 completed, bed search initiated.     Barriers to Discharge: Continued Medical Work up  Expected Discharge Plan and Services       Living arrangements for the past 2 months: Single Family Home                                       Social Determinants of Health (SDOH) Interventions SDOH Screenings   Food Insecurity: Patient Unable To Answer (01/15/2023)  Housing: High Risk (01/15/2023)  Transportation Needs: No Transportation Needs (11/10/2022)  Utilities: Patient Unable To Answer (01/15/2023)  Tobacco Use: Medium Risk (01/05/2023)    Readmission Risk Interventions    06/25/2022   10:13 AM  Readmission Risk Prevention Plan  Transportation Screening Complete  PCP or Specialist Appt within 3-5 Days Complete  HRI or Home Care Consult Complete  Social Work Consult for Recovery Care Planning/Counseling Complete  Palliative Care Screening Not Applicable  Medication Review Oceanographer) Complete

## 2023-01-19 DIAGNOSIS — I214 Non-ST elevation (NSTEMI) myocardial infarction: Secondary | ICD-10-CM | POA: Diagnosis not present

## 2023-01-19 LAB — GLUCOSE, CAPILLARY
Glucose-Capillary: 172 mg/dL — ABNORMAL HIGH (ref 70–99)
Glucose-Capillary: 246 mg/dL — ABNORMAL HIGH (ref 70–99)
Glucose-Capillary: 175 mg/dL — ABNORMAL HIGH (ref 70–99)
Glucose-Capillary: 188 mg/dL — ABNORMAL HIGH (ref 70–99)

## 2023-01-19 LAB — BASIC METABOLIC PANEL
Anion gap: 8 (ref 5–15)
BUN: 29 mg/dL — ABNORMAL HIGH (ref 8–23)
CO2: 31 mmol/L (ref 22–32)
Calcium: 8.5 mg/dL — ABNORMAL LOW (ref 8.9–10.3)
Chloride: 100 mmol/L (ref 98–111)
Creatinine, Ser: 0.89 mg/dL (ref 0.61–1.24)
GFR, Estimated: >60 mL/min (ref >60)
Glucose, Bld: 162 mg/dL — ABNORMAL HIGH (ref 70–99)
Potassium: 3.4 mmol/L — ABNORMAL LOW (ref 3.5–5.1)
Sodium: 139 mmol/L (ref 135–145)

## 2023-01-19 MED ORDER — METFORMIN HCL 500 MG PO TABS
1000.0000 mg | ORAL_TABLET | Freq: Every day | ORAL | Status: DC
  Administered 2023-01-19 – 2023-01-21 (×3): 1000 mg via ORAL
  Filled 2023-01-19 (×3): qty 2

## 2023-01-19 MED ORDER — HYDROCOD POLI-CHLORPHE POLI ER 10-8 MG/5ML PO SUER
5.0000 mL | Freq: Two times a day (BID) | ORAL | Status: DC | PRN
  Administered 2023-01-19 – 2023-01-21 (×5): 5 mL via ORAL
  Filled 2023-01-19 (×5): qty 5

## 2023-01-19 MED ORDER — INSULIN GLARGINE-YFGN 100 UNIT/ML ~~LOC~~ SOLN
20.0000 [IU] | Freq: Every day | SUBCUTANEOUS | Status: DC
  Administered 2023-01-19 – 2023-01-21 (×3): 20 [IU] via SUBCUTANEOUS
  Filled 2023-01-19 (×3): qty 0.2

## 2023-01-19 MED ORDER — DOCUSATE SODIUM 100 MG PO CAPS
100.0000 mg | ORAL_CAPSULE | Freq: Two times a day (BID) | ORAL | Status: DC
  Filled 2023-01-19 (×3): qty 1

## 2023-01-19 MED ORDER — ASPIRIN 81 MG PO TBEC
81.0000 mg | DELAYED_RELEASE_TABLET | Freq: Every day | ORAL | Status: DC
  Administered 2023-01-19 – 2023-01-21 (×3): 81 mg via ORAL
  Filled 2023-01-19 (×3): qty 1

## 2023-01-19 NOTE — Progress Notes (Signed)
Speech Language Pathology Treatment: Dysphagia  Patient Details Name: Cody Alexander MRN: [id number] DOB: [birth year] Today's Date: 01/19/2023 Time: 8657-8469 SLP Time Calculation (min) (ACUTE ONLY): 10 min  Assessment / Plan / Recommendation Clinical Impression  Pt seen for diet tolerance. Pt feeding self breakfast upon SLP entrance to room. Pt observed with items from Dysphagia 3, Thin Liquid meal tray. Pt demonstrated an intact oral swallow. Pharyngeal swallow appeared [facility] across trials. Pt noted that he would like to continue a Dysphagia 3 at this time. Recommend continuation of current diet with safe swallowing strategies/aspiration precautions as outlined below. SLP to sign off as pt has no acute SLP needs. Pt made aware of results, recommendations, and SLP POC. Pt verbalized understanding/agreement.   HPI HPI: This is a 66 yo male with a PMH of Obesity, COPD and Chronic Systolic Diastolic CHF, OSA, neuropathy, Hom eO2 4L, substance abuse(+Meth).  He presented to [facility] ER on 05/12 from home via EMS with c/o shortness of breath onset 05/12.  Per ER notes EMS reported pt wears 4L O2 chronically, however he has been noncompliant with his oxygen.  When EMS arrival at pts home he was noted to be diaphoretic with initial O2 sats @86 % on 4L O2.       ED Course   Upon arrival to the ER pt alert/oriented but remained diaphoretic/tachypneic requiring 15L NRB with resolution of hypoxia.  Pt placed on Bipap.  Pt reported he had some lower extremity swelling and inability to void.  CXR concerning for mild interstitial edema.  Pt developed acute encephalopathy and acute hypercapnic hypoxic respiratory failure overnight requiring portable Bipap.  However, pt failed Bipap developed jerking movements concerning for myoclonus requiring transfer to ICU. PCCM team consulted and pt mechanically intubated; extubation on 01/16/2023.  Recent CXR: Similar left base consolidation with left effusion.  Pt has had 7 ED  visits/admissions in ~6 months.      SLP Plan  All goals met      Recommendations for follow up therapy are one component of a multi-disciplinary discharge planning process, led by the attending physician.  Recommendations may be updated based on patient status, additional functional criteria and insurance authorization.    Recommendations  Diet recommendations: Dysphagia 3 (mechanical soft);Thin liquid Liquids provided via: Cup;Straw;Teaspoon Medication Administration:  (as tolerated) Supervision: Patient able to self feed Compensations: Slow rate;Minimize environmental distractions;Small sips/bites Postural Changes and/or Swallow Maneuvers: Out of bed for meals;Seated upright 90 degrees;Upright 30-60 min after meal                  Oral care BID     Dysphagia, unspecified (R13.10)     All goals met    Clyde Canterbury, M.S., CCC-SLP Speech-Language Pathologist [facility] [phone] Arnette Felts)  Woodroe Chen  01/19/2023, 9:54 AM

## 2023-01-19 NOTE — Progress Notes (Signed)
[facility]    SUBJECTIVE: States to be doing reasonably well improved less short of breath no pain resting comfortably in bed has ambulated with physical therapy and states to have done reasonably well still generalized soreness weakness and fatigue   Vitals:   01/18/23 2126 01/18/23 2354 01/19/23 0428 01/19/23 0825  BP:  (!) 147/95 (!) 141/81 126/85  Pulse:  73 68 72  Resp:  18 20 18   Temp:  97.9 F (36.6 C) 98 F (36.7 C) (!) 97.5 F (36.4 C)  TempSrc:      SpO2: 97% 100% 100% 93%  Weight:   132.6 kg   Height:         Intake/Output Summary (Last 24 hours) at 01/19/2023 1146 Last data filed at 01/19/2023 1139 Gross per 24 hour  Intake 1060 ml  Output 4725 ml  Net -3665 ml      PHYSICAL EXAM  General: Well developed, well nourished, in no acute distress HEENT:  Normocephalic and atramatic Neck:  No JVD.  Lungs: Clear bilaterally to auscultation and percussion. Heart: HRRR . Normal S1 and S2 without gallops or murmurs.  Abdomen: Bowel sounds are positive, abdomen soft and non-tender  Msk:  Back normal, normal gait. Normal strength and tone for age. Extremities: No clubbing, cyanosis or edema.   Neuro: Alert and oriented X 3. Psych:  Good affect, responds appropriately   LABS: Basic Metabolic Panel: Recent Labs    01/18/23 0422 01/19/23 0407  NA 143 139  K 3.8 3.4*  CL 107 100  CO2 28 31  GLUCOSE 133* 162*  BUN 29* 29*  CREATININE 0.74 0.89  CALCIUM 8.7* 8.5*   Liver Function Tests: No results for input(s): "AST", "ALT", "ALKPHOS", "BILITOT", "PROT", "ALBUMIN" in the last 72 hours. No results for input(s): "LIPASE", "AMYLASE" in the last 72 hours. CBC: Recent Labs    01/17/23 0611  WBC 10.6*  HGB 13.2  HCT 43.7  MCV 93.4  PLT 303   Cardiac Enzymes: No results for input(s): "CKTOTAL", "CKMB", "CKMBINDEX", "TROPONINI" in the last 72 hours. BNP: Invalid input(s): "POCBNP" D-Dimer: No results for input(s): "DDIMER" in the last 72  hours. Hemoglobin A1C: No results for input(s): "HGBA1C" in the last 72 hours. Fasting Lipid Panel: No results for input(s): "CHOL", "HDL", "LDLCALC", "TRIG", "CHOLHDL", "LDLDIRECT" in the last 72 hours. Thyroid Function Tests: No results for input(s): "TSH", "T4TOTAL", "T3FREE", "THYROIDAB" in the last 72 hours.  Invalid input(s): "FREET3" Anemia Panel: No results for input(s): "VITAMINB12", "FOLATE", "FERRITIN", "TIBC", "IRON", "RETICCTPCT" in the last 72 hours.  No results found.   Echo preserved left ventricular function EF of at least 55 to 60%  TELEMETRY: Normal sinus rhythm rate of about 70 nonspecific ST changes:  ASSESSMENT AND PLAN:  Principal Problem:   NSTEMI (non-ST elevated myocardial infarction) (HCC) Active Problems:   Chronic obstructive pulmonary disease (COPD) (HCC)   Type 2 diabetes mellitus with hyperglycemia, with long-term current use of insulin (HCC)   Essential hypertension   Obesity, Class III, BMI 40-49.9 (morbid obesity) (HCC)   Acute on chronic respiratory failure with hypoxia (HCC)   Acute on chronic diastolic CHF (congestive heart failure) (HCC)   OSA (obstructive sleep apnea)   Obesity (BMI 30-39.9)   Benign prostatic hyperplasia with urinary hesitancy   CAD (coronary artery disease)   Urinary tract infection   Hypokalemia   History of kidney stones   Goals of care, counseling/discussion    Plan Continue current management increase activity ambulate in the  hall Recommend significant weight loss exercise portion control Supplemental oxygen as necessary for hypoxemia respiratory failure Obstructive sleep apnea MI evaluation and management sleep study CPAP if indicated weight loss Hypertension management should be continued Chest pain angina possible non-STEMI will defer cardiac cath decision to Dr. Lucky Cowboy, MD 01/19/2023 11:46 AM

## 2023-01-19 NOTE — Progress Notes (Signed)
Cpap refused 

## 2023-01-19 NOTE — Progress Notes (Signed)
[facility]  - [place] at [facility]   [name]    MR#:  [id number]  DATE OF BIRTH:  1956-04-21  SUBJECTIVE:   Patient overall doing well. No family at bedside.   VITALS:  Blood pressure 119/84, pulse 89, temperature 97.7 F (36.5 C), resp. rate 18, height 6\' 1"  (1.854 m), weight 132.6 kg, SpO2 95 %.  PHYSICAL EXAMINATION:   GENERAL:  67 y.o.-year-old patient with no acute distress. Obese LUNGS: distant breath sounds bilaterally, no wheezing CARDIOVASCULAR: S1, S2 normal. No murmur   ABDOMEN: Soft, nontender, nondistended. Bowel sounds present.  EXTREMITIES: No  edema b/l.    NEUROLOGIC: nonfocal  patient is alert and awake SKIN: No obvious rash, lesion, or ulcer.   LABORATORY PANEL:  CBC Recent Labs  Lab 01/17/23 0611  WBC 10.6*  HGB 13.2  HCT 43.7  PLT 303    Chemistries  Recent Labs  Lab 01/16/23 0442 01/17/23 0611 01/19/23 0407  NA 148*   < > 139  K 4.3   < > 3.4*  CL 114*   < > 100  CO2 26   < > 31  GLUCOSE 132*   < > 162*  BUN 47*   < > 29*  CREATININE 0.85   < > 0.89  CALCIUM 8.7*   < > 8.5*  MG 2.7*  --   --    < > = values in this interval not displayed.   Cardiac Enzymes No results for input(s): "TROPONINI" in the last 168 hours. RADIOLOGY:  No results found.  Assessment and Plan  67 yo male with a PMH of COPD and Chronic Systolic Diastolic CHF. He presented to [facility] ER on 05/12 from home via EMS with c/o shortness of breath onset 05/12. Per ER notes EMS reported pt wears 4L O2 chronically, however he has been noncompliant with his oxygen. When EMS arrival at pts home he was noted to be diaphoretic with initial O2 sats @86 % on 4L O2.   Acute on chronic hypercapnic respiratory failure in the setting of COPD exacerbation severe/possible pneumonia 2/2 copd, hfpef, possible hcap. On 4 liters at home. Extubated 4/23.  -- Patient overall improving. Finish five day of broad-spectrum antibiotic. Afebrile.  Overall stable.  -- Currently on 4 L nasal cannula oxygen which is chronic. -- Incentive spirometer, bronchodilators, nebulizer  Acute on chronic diastolic congestive heart failure -- improving    Mild elevated troponin in the setting of respiratory failure Cardiology following, per their note conservative mgmt for now   OSA - cpap qhs   T2DM Glucose currently appropriate - SSI 0-20    Dysphagia Slp has cleared for dysphagia 3 diet    Hypernatremia 2/2 lack of access to free water. Resolved with diet    Debility PT advising SNF, TOC is working on this     DVT prophylaxis: lovenox Code Status: DNI (see palliative consult note 5/23) Family Communication: none @ bedside   Level of care: Telemetry Cardiac Status is: Inpatient Remains inpatient appropriate because: patient overall improving. Nearing baseline. TOC for discharge planning to rehab. Medically best at baseline for discharge          TOTAL TIME TAKING CARE OF THIS PATIENT: 35 minutes.  >50% time spent on counselling and coordination of care  Note: This dictation was prepared with Dragon dictation along with smaller phrase technology. Any transcriptional errors that result from this process are unintentional.  Enedina Finner M.D    [facility]  CC: Primary care physician; [facility], [employer]

## 2023-01-20 DIAGNOSIS — I214 Non-ST elevation (NSTEMI) myocardial infarction: Secondary | ICD-10-CM | POA: Diagnosis not present

## 2023-01-20 DIAGNOSIS — R079 Chest pain, unspecified: Secondary | ICD-10-CM | POA: Diagnosis not present

## 2023-01-20 DIAGNOSIS — R0603 Acute respiratory distress: Secondary | ICD-10-CM | POA: Diagnosis not present

## 2023-01-20 LAB — GLUCOSE, CAPILLARY
Glucose-Capillary: 143 mg/dL — ABNORMAL HIGH (ref 70–99)
Glucose-Capillary: 162 mg/dL — ABNORMAL HIGH (ref 70–99)
Glucose-Capillary: 212 mg/dL — ABNORMAL HIGH (ref 70–99)
Glucose-Capillary: 168 mg/dL — ABNORMAL HIGH (ref 70–99)

## 2023-01-20 MED ORDER — PREDNISONE 20 MG PO TABS
20.0000 mg | ORAL_TABLET | Freq: Every day | ORAL | Status: DC
  Administered 2023-01-20: 20 mg via ORAL
  Filled 2023-01-20 (×2): qty 1

## 2023-01-20 MED ORDER — POTASSIUM CHLORIDE CRYS ER 20 MEQ PO TBCR
20.0000 meq | EXTENDED_RELEASE_TABLET | Freq: Once | ORAL | Status: AC
  Administered 2023-01-20: 20 meq via ORAL
  Filled 2023-01-20: qty 1

## 2023-01-20 MED ORDER — IPRATROPIUM-ALBUTEROL 0.5-2.5 (3) MG/3ML IN SOLN
3.0000 mL | Freq: Three times a day (TID) | RESPIRATORY_TRACT | Status: DC
  Filled 2023-01-20: qty 3

## 2023-01-20 NOTE — Progress Notes (Signed)
SUBJECTIVE: Patient is alert and oriented and denies any chest pain or shortness of breath sitting comfortably on in bed.  Vitals:   01/19/23 2007 01/19/23 2048 01/19/23 2316 01/20/23 0338  BP:  109/71 132/81 (!) 144/94  Pulse:  86 78 73  Resp:  18 18 17   Temp:  98 F (36.7 C) 98.4 F (36.9 C) 97.6 F (36.4 C)  TempSrc:  Oral Oral Oral  SpO2: 98% 99% 96% 97%  Weight:      Height:        Intake/Output Summary (Last 24 hours) at 01/20/2023 0901 Last data filed at 01/19/2023 2318 Gross per 24 hour  Intake 1290.74 ml  Output 1850 ml  Net -559.26 ml    LABS: Basic Metabolic Panel: Recent Labs    01/18/23 0422 01/19/23 0407  NA 143 139  K 3.8 3.4*  CL 107 100  CO2 28 31  GLUCOSE 133* 162*  BUN 29* 29*  CREATININE 0.74 0.89  CALCIUM 8.7* 8.5*   Liver Function Tests: No results for input(s): "AST", "ALT", "ALKPHOS", "BILITOT", "PROT", "ALBUMIN" in the last 72 hours. No results for input(s): "LIPASE", "AMYLASE" in the last 72 hours. CBC: No results for input(s): "WBC", "NEUTROABS", "HGB", "HCT", "MCV", "PLT" in the last 72 hours. Cardiac Enzymes: No results for input(s): "CKTOTAL", "CKMB", "CKMBINDEX", "TROPONINI" in the last 72 hours. BNP: Invalid input(s): "POCBNP" D-Dimer: No results for input(s): "DDIMER" in the last 72 hours. Hemoglobin A1C: No results for input(s): "HGBA1C" in the last 72 hours. Fasting Lipid Panel: No results for input(s): "CHOL", "HDL", "LDLCALC", "TRIG", "CHOLHDL", "LDLDIRECT" in the last 72 hours. Thyroid Function Tests: No results for input(s): "TSH", "T4TOTAL", "T3FREE", "THYROIDAB" in the last 72 hours.  Invalid input(s): "FREET3" Anemia Panel: No results for input(s): "VITAMINB12", "FOLATE", "FERRITIN", "TIBC", "IRON", "RETICCTPCT" in the last 72 hours.   PHYSICAL EXAM General: Well developed, well nourished, in no acute distress HEENT:  Normocephalic and atramatic Neck:  No JVD.  Lungs: Clear bilaterally to auscultation and  percussion. Heart: HRRR . Normal S1 and S2 without gallops or murmurs.  Abdomen: Bowel sounds are positive, abdomen soft and non-tender  Msk:  Back normal, normal gait. Normal strength and tone for age. Extremities: No clubbing, cyanosis or edema.   Neuro: Alert and oriented X 3. Psych:  Good affect, responds appropriately  TELEMETRY: Normal sinus rhythm  ASSESSMENT AND PLAN: Status post respiratory failure and mildly elevated troponin with associated chest pain.  Status post extubation after being admitted for COPD exacerbation.  Patient had normal ejection fraction and normal wall motion but has history of coronary artery disease in the past.  Patient did not follow-up with his primary cardiologist after last admission and was decided to do workup in the hospital after extubation.  I had a lengthy discussion with him about doing cardiac catheterization because he had elevated troponin and chest pain on admission.  Patient states he does not want to go through with it at this time.  Patient states that if he ever wants to have it done then he will let me know and wants to follow-up with me in the office instead after discharge.  Advise given follow-up 2 to 3 weeks after discharge with me in the office at [facility] medical.  Patient refused cardiac workup while in the hospital .   ICD-10-CM   1. Respiratory distress  R06.03     2. Congestive heart failure, unspecified HF chronicity, unspecified heart failure type (HCC)  I50.9  3. Lower urinary tract infection  N39.0       Principal Problem:   NSTEMI (non-ST elevated myocardial infarction) (HCC) Active Problems:   Chronic obstructive pulmonary disease (COPD) (HCC)   Type 2 diabetes mellitus with hyperglycemia, with long-term current use of insulin (HCC)   Essential hypertension   Obesity, Class III, BMI 40-49.9 (morbid obesity) (HCC)   Acute on chronic respiratory failure with hypoxia (HCC)   Acute on chronic diastolic CHF (congestive  heart failure) (HCC)   OSA (obstructive sleep apnea)   Obesity (BMI 30-39.9)   Benign prostatic hyperplasia with urinary hesitancy   CAD (coronary artery disease)   Urinary tract infection   Hypokalemia   History of kidney stones   Goals of care, counseling/discussion    Adrian Blackwater, MD, [facility] 01/20/2023 9:01 AM

## 2023-01-20 NOTE — Progress Notes (Signed)
PROGRESS NOTE    Cody Alexander  ZOX:[id number] DOB: June 23, 1956 DOA: 01/05/2023 PCP: [facility]     Brief Narrative:   This is a 67 yo male with a PMH of COPD and Chronic Systolic Diastolic CHF.  He presented to [facility] ER on 05/12 from home via EMS with c/o shortness of breath onset 05/12.  Per ER notes EMS reported pt wears 4L O2 chronically, however he has been noncompliant with his oxygen.  When EMS arrival at pts home he was noted to be diaphoretic with initial O2 sats @86 % on 4L O2.     ED Course  Upon arrival to the ER pt alert/oriented but remained diaphoretic/tachypneic requiring 15L NRB with resolution of hypoxia.  Pt placed on Bipap.  Pt reported he had some lower extremity swelling and inability to void.  CXR concerning for mild interstitial edema.  CT Renal stone study revealed no nephrolithiasis, ureterolithiasis, or obstructive uropathy but a small amount of gas present in the bladder presumably related to procedure and small bladder diverticula.  CTA Chest negative for PE or acute pulmonary parenchymal findings.  UA concerning for possible UTI.  Pt received cefepime.  He was subsequently admitted to the progressive care unit for additional workup and treatment.    05/13: Pt admitted to the progressive care unit with acute on chronic hypoxic respiratory failure secondary to acute on chronic CHF exacerbation, UTI, and NSTEMI  05/15: Pt developed acute encephalopathy and acute hypercapnic hypoxic respiratory failure overnight requiring portable Bipap.  However, pt failed Bipap developed jerking movements concerning for myoclonus requiring transfer to ICU. PCCM team consulted and pt mechanically intubated  01/09/23- patient had abnormal EKG today but repeat cardiac biomarkers seemed to be normal. He is on MV today he is not ready for SBT.  We did deliver diamox today to help with acid base disorder.  01/10/23- for SBT today, on insulin gtt, electrolytes are improved.  Reducing steroids today.  01/11/23- failed SBT today. >100k colonies of ecoli on Urine culture. BP is low today , have dcd aldactone and flomax. Continuing rocephin, unable to liberate from MV today. 01/12/23- PRVC weaned to 50%, secretions improved.  5/20 severe hypoxia, failure to wean from vent 5/21 severe COPD, hypoxia-failure to wean from vent 5/22 severe COPD, severe Hypoxia 5/23 extubated  Assessment & Plan:   Principal Problem:   NSTEMI (non-ST elevated myocardial infarction) [facility]) Active Problems:   Acute on chronic diastolic CHF (congestive heart failure) (HCC)   Acute on chronic respiratory failure with hypoxia (HCC)   CAD (coronary artery disease)   Urinary tract infection   Chronic obstructive pulmonary disease (COPD) (HCC)   Essential hypertension   Type 2 diabetes mellitus with hyperglycemia, with long-term current use of insulin (HCC)   Obesity (BMI 30-39.9)   Obesity, Class III, BMI 40-49.9 (morbid obesity) (HCC)   OSA (obstructive sleep apnea)   Benign prostatic hyperplasia with urinary hesitancy   Hypokalemia   History of kidney stones   Goals of care, counseling/discussion  # Acute on chronic hypercapnic respiratory failure 2/2 copd, hfpef, possible hcap. On 4 liters at home. Extubated 4/23. Today weaned to his home 4 liters and breathing comfortably - maintain o2, wean  # COPD With severe exacerbation. Improving - s/p methylpred, now prednisone taper, 20 qd today and tomorrow, then 10 for 2 days - cont cefepime as below - continue duonebs and other breathing treatments  # HCAP Left base infiltrate/collapse with small effusion on cxr 5/20, started on  cefepime 5/21. Appears to be improving - continue cefepime 7 days through 5/28  # HFrecoveredEF with acute exacerbation History noncompliance. Frequent admissions here and [facility] for this. Cardiology following and managing. Home bumex, lasix isordil, metoprolol are all on hold. - I have re-started bumex  today  # Mild tropinemia Cardiology following, they offered patient cath today but patient declines, plan is outpt f/u Dr. Welton Flakes  # OSA - cpap qhs  # T2DM Glucose currently appropriate - SSI 0-20 - home metformin - semglee 20  # Dysphagia Slp has cleared for dysphagia 3 diet  # Hypernatremia 2/2 lack of access to free water. Resolved with diet  # Debility PT advising SNF, TOC is working on this   DVT prophylaxis: lovenox Code Status: DNI (see palliative consult note 5/23) Family Communication: none @ bedside  Level of care: Telemetry Cardiac Status is: Inpatient Remains inpatient appropriate because: severity of illness    Consultants:  cardiology  Procedures: intubation  Antimicrobials:  Cefepime as above    Subjective: No complaints this morning. Denies chest pain or sob  Objective: Vitals:   01/19/23 2316 01/20/23 0338 01/20/23 0704 01/20/23 1222  BP: 132/81 (!) 144/94  132/80  Pulse: 78 73  84  Resp: 18 17  20   Temp: 98.4 F (36.9 C) 97.6 F (36.4 C)  97.7 F (36.5 C)  TempSrc: Oral Oral  Oral  SpO2: 96% 97%  98%  Weight:   128.9 kg   Height:        Intake/Output Summary (Last 24 hours) at 01/20/2023 1253 Last data filed at 01/20/2023 1244 Gross per 24 hour  Intake 1050.74 ml  Output 650 ml  Net 400.74 ml   Filed Weights   01/18/23 0427 01/19/23 0428 01/20/23 0704  Weight: (!) 139.7 kg 132.6 kg 128.9 kg    Examination:  General exam: Appears calm and comfortable  Respiratory system: few scattered rhonchi, rales right base Cardiovascular system: S1 & S2 heard, RRR. Soft systolic murmur. Distant heart sounds Gastrointestinal system: Abdomen is obese, soft and nontender. No organomegaly or masses felt. Normal bowel sounds heard. Central nervous system: Alert and oriented. Moving all 4 Extremities: Symmetric 5 x 5 power. Trace LE edema Skin: No rashes, lesions or ulcers Psychiatry: grumpy    Data Reviewed: I have personally reviewed  following labs and imaging studies  CBC: Recent Labs  Lab 01/14/23 0444 01/15/23 0658 01/16/23 0442 01/17/23 0611  WBC 10.5 16.4* 8.3 10.6*  HGB 12.8* 13.0 12.3* 13.2  HCT 42.0 43.0 41.2 43.7  MCV 94.0 94.5 94.3 93.4  PLT 281 341 242 303   Basic Metabolic Panel: Recent Labs  Lab 01/14/23 0444 01/15/23 0658 01/16/23 0442 01/17/23 0611 01/18/23 0422 01/19/23 0407  NA 145 151* 148* 149* 143 139  K 5.1 5.0 4.3 4.1 3.8 3.4*  CL 108 114* 114* 115* 107 100  CO2 30 30 26 27 28 31   GLUCOSE 153* 202* 132* 132* 133* 162*  BUN 61* 52* 47* 35* 29* 29*  CREATININE 0.98 0.99 0.85 0.87 0.74 0.89  CALCIUM 8.5* 9.0 8.7* 8.9 8.7* 8.5*  MG 3.2* 2.7* 2.7*  --   --   --    GFR: Estimated Creatinine Clearance: 114.9 mL/min (by C-G formula based on SCr of 0.89 mg/dL). Liver Function Tests: No results for input(s): "AST", "ALT", "ALKPHOS", "BILITOT", "PROT", "ALBUMIN" in the last 168 hours. No results for input(s): "LIPASE", "AMYLASE" in the last 168 hours. No results for input(s): "AMMONIA" in the last 168  hours. Coagulation Profile: No results for input(s): "INR", "PROTIME" in the last 168 hours. Cardiac Enzymes: No results for input(s): "CKTOTAL", "CKMB", "CKMBINDEX", "TROPONINI" in the last 168 hours. BNP (last 3 results) No results for input(s): "PROBNP" in the last 8760 hours. HbA1C: No results for input(s): "HGBA1C" in the last 72 hours. CBG: Recent Labs  Lab 01/19/23 1237 01/19/23 1621 01/19/23 2055 01/20/23 0841 01/20/23 1223  GLUCAP 246* 175* 188* 143* 162*   Lipid Profile: No results for input(s): "CHOL", "HDL", "LDLCALC", "TRIG", "CHOLHDL", "LDLDIRECT" in the last 72 hours. Thyroid Function Tests: No results for input(s): "TSH", "T4TOTAL", "FREET4", "T3FREE", "THYROIDAB" in the last 72 hours. Anemia Panel: No results for input(s): "VITAMINB12", "FOLATE", "FERRITIN", "TIBC", "IRON", "RETICCTPCT" in the last 72 hours. Urine analysis:    Component Value Date/Time    COLORURINE AMBER (A) 01/05/2023 1652   APPEARANCEUR CLOUDY (A) 01/05/2023 1652   APPEARANCEUR Clear 08/20/2013 0849   LABSPEC 1.020 01/05/2023 1652   LABSPEC 1.014 08/20/2013 0849   PHURINE 5.5 01/05/2023 1652   GLUCOSEU 500 (A) 01/05/2023 1652   GLUCOSEU Negative 08/20/2013 0849   HGBUR LARGE (A) 01/05/2023 1652   BILIRUBINUR NEGATIVE 01/05/2023 1652   BILIRUBINUR Negative 08/20/2013 0849   KETONESUR TRACE (A) 01/05/2023 1652   PROTEINUR >300 (A) 01/05/2023 1652   NITRITE NEGATIVE 01/05/2023 1652   LEUKOCYTESUR MODERATE (A) 01/05/2023 1652   LEUKOCYTESUR Negative 08/20/2013 0849   Sepsis Labs: @LABRCNTIP (procalcitonin:4,lacticidven:4)  ) Recent Results (from the past 240 hour(s))  Culture, Respiratory w Gram Stain     Status: None   Collection Time: 01/14/23  5:28 PM   Specimen: Tracheal Aspirate; Respiratory  Result Value Ref Range Status   Specimen Description   Final    TRACHEAL ASPIRATE Performed at [facility], [address], [place], [place] [address]    Special Requests   Final    Immunocompromised Performed at [facility], [address] [place], [place] [address]    Gram Stain   Final    RARE WBC PRESENT, PREDOMINANTLY PMN NO ORGANISMS SEEN    Culture   Final    RARE Normal respiratory flora-no Staph aureus or Pseudomonas seen Performed at [facility], [address] [place], [place] [address]    Report Status 01/17/2023 FINAL  Final         Radiology Studies: No results found.      Scheduled Meds:  aspirin EC  81 mg Oral Daily   budesonide (PULMICORT) nebulizer solution  0.25 mg Nebulization BID   bumetanide  3 mg Oral Daily   Chlorhexidine Gluconate Cloth  6 each Topical Daily   docusate sodium  100 mg Oral BID   enoxaparin (LOVENOX) injection  70 mg Subcutaneous Q24H   insulin aspart  0-20 Units Subcutaneous TID WC   insulin aspart  0-5 Units Subcutaneous QHS   insulin glargine-yfgn  20 Units Subcutaneous  Daily   metFORMIN  1,000 mg Oral Daily   multivitamin with minerals  1 tablet Oral Daily   Continuous Infusions:  sodium chloride 10 mL/hr at 01/19/23 1615     LOS: 15 days     Silvano Bilis, MD [facility]   If 7PM-7AM, please contact night-coverage [email or web address] Password [facility] 01/20/2023, 12:53 PM

## 2023-01-20 NOTE — Care Management Important Message (Signed)
Important Message  Patient Details  Name: Cody Alexander MRN: [id number] Date of Birth: 1956/03/26   Medicare Important Message Given:  Yes     [name] 01/20/2023, 1:03 PM

## 2023-01-20 NOTE — Progress Notes (Signed)
[facility]    SUBJECTIVE: Resting comfortably in bed denies any chest pain states to feel reasonably well no leg swelling not ambulating as often as he would like no palpitations tachycardia or chest pain   Vitals:   01/19/23 2007 01/19/23 2048 01/19/23 2316 01/20/23 0338  BP:  109/71 132/81 (!) 144/94  Pulse:  86 78 73  Resp:  18 18 17   Temp:  98 F (36.7 C) 98.4 F (36.9 C) 97.6 F (36.4 C)  TempSrc:  Oral Oral Oral  SpO2: 98% 99% 96% 97%  Weight:      Height:         Intake/Output Summary (Last 24 hours) at 01/20/2023 1005 Last data filed at 01/19/2023 2318 Gross per 24 hour  Intake 1290.74 ml  Output 1850 ml  Net -559.26 ml      PHYSICAL EXAM  General: Well developed, well nourished, in no acute distress HEENT:  Normocephalic and atramatic Neck:  No JVD.  Lungs: Clear bilaterally to auscultation and percussion. Heart: HRRR . Normal S1 and S2 without gallops or murmurs.  Abdomen: Bowel sounds are positive, abdomen soft and non-tender  Msk:  Back normal, normal gait. Normal strength and tone for age. Extremities: No clubbing, cyanosis or edema.   Neuro: Alert and oriented X 3. Psych:  Good affect, responds appropriately   LABS: Basic Metabolic Panel: Recent Labs    01/18/23 0422 01/19/23 0407  NA 143 139  K 3.8 3.4*  CL 107 100  CO2 28 31  GLUCOSE 133* 162*  BUN 29* 29*  CREATININE 0.74 0.89  CALCIUM 8.7* 8.5*   Liver Function Tests: No results for input(s): "AST", "ALT", "ALKPHOS", "BILITOT", "PROT", "ALBUMIN" in the last 72 hours. No results for input(s): "LIPASE", "AMYLASE" in the last 72 hours. CBC: No results for input(s): "WBC", "NEUTROABS", "HGB", "HCT", "MCV", "PLT" in the last 72 hours. Cardiac Enzymes: No results for input(s): "CKTOTAL", "CKMB", "CKMBINDEX", "TROPONINI" in the last 72 hours. BNP: Invalid input(s): "POCBNP" D-Dimer: No results for input(s): "DDIMER" in the last 72 hours. Hemoglobin A1C: No results for input(s):  "HGBA1C" in the last 72 hours. Fasting Lipid Panel: No results for input(s): "CHOL", "HDL", "LDLCALC", "TRIG", "CHOLHDL", "LDLDIRECT" in the last 72 hours. Thyroid Function Tests: No results for input(s): "TSH", "T4TOTAL", "T3FREE", "THYROIDAB" in the last 72 hours.  Invalid input(s): "FREET3" Anemia Panel: No results for input(s): "VITAMINB12", "FOLATE", "FERRITIN", "TIBC", "IRON", "RETICCTPCT" in the last 72 hours.  No results found.   Echo preserved left ventricular function EF of 55%   TELEMETRY: Normal sinus rhythm rate of 82 with nonspecific ST-T wave changes:  ASSESSMENT AND PLAN:  Principal Problem:   NSTEMI (non-ST elevated myocardial infarction) (HCC) Active Problems:   Chronic obstructive pulmonary disease (COPD) (HCC)   Type 2 diabetes mellitus with hyperglycemia, with long-term current use of insulin (HCC)   Essential hypertension   Obesity, Class III, BMI 40-49.9 (morbid obesity) (HCC)   Acute on chronic respiratory failure with hypoxia (HCC)   Acute on chronic diastolic CHF (congestive heart failure) (HCC)   OSA (obstructive sleep apnea)   Obesity (BMI 30-39.9)   Benign prostatic hyperplasia with urinary hesitancy   CAD (coronary artery disease)   Urinary tract infection   Hypokalemia   History of kidney stones   Goals of care, counseling/discussion    Plan Continue current therapy with telemetry increase activity out of bed to chair Agree with diabetes management and control Recommend aggressive weight loss exercise portion control Respiratory failure  improved supplemental oxygen as necessary continue inhalers follow-up with pulmonary Doctor sleep apnea recommend sleep study CPAP weight loss Elevated troponins on admission will probably need further ischemic evaluation as per Dr. Lucky Cowboy, MD 01/20/2023 10:05 AM

## 2023-01-21 DIAGNOSIS — R0603 Acute respiratory distress: Secondary | ICD-10-CM | POA: Diagnosis not present

## 2023-01-21 DIAGNOSIS — R079 Chest pain, unspecified: Secondary | ICD-10-CM | POA: Diagnosis not present

## 2023-01-21 DIAGNOSIS — I214 Non-ST elevation (NSTEMI) myocardial infarction: Secondary | ICD-10-CM | POA: Diagnosis not present

## 2023-01-21 LAB — CBC
HCT: 45.7 % (ref 39.0–52.0)
Hemoglobin: 14.7 g/dL (ref 13.0–17.0)
MCH: 28.5 pg (ref 26.0–34.0)
MCHC: 32.2 g/dL (ref 30.0–36.0)
MCV: 88.6 fL (ref 80.0–100.0)
Platelets: 270 10*3/uL (ref 150–400)
RBC: 5.16 MIL/uL (ref 4.22–5.81)
RDW: 12.7 % (ref 11.5–15.5)
WBC: 8.8 10*3/uL (ref 4.0–10.5)
nRBC: 0 % (ref 0.0–0.2)

## 2023-01-21 LAB — BASIC METABOLIC PANEL
Anion gap: 8 (ref 5–15)
BUN: 32 mg/dL — ABNORMAL HIGH (ref 8–23)
CO2: 32 mmol/L (ref 22–32)
Calcium: 8.6 mg/dL — ABNORMAL LOW (ref 8.9–10.3)
Chloride: 97 mmol/L — ABNORMAL LOW (ref 98–111)
Creatinine, Ser: 0.87 mg/dL (ref 0.61–1.24)
GFR, Estimated: >60 mL/min (ref >60)
Glucose, Bld: 137 mg/dL — ABNORMAL HIGH (ref 70–99)
Potassium: 3.8 mmol/L (ref 3.5–5.1)
Sodium: 137 mmol/L (ref 135–145)

## 2023-01-21 LAB — GLUCOSE, CAPILLARY: Glucose-Capillary: 107 mg/dL — ABNORMAL HIGH (ref 70–99)

## 2023-01-21 MED ORDER — ENOXAPARIN SODIUM 80 MG/0.8ML IJ SOSY: 0.5000 mg/kg | PREFILLED_SYRINGE | INTRAMUSCULAR | Status: DC

## 2023-01-21 MED ORDER — LANTUS SOLOSTAR 100 UNIT/ML ~~LOC~~ SOPN: 20.0000 [IU] | PEN_INJECTOR | Freq: Every day | SUBCUTANEOUS | 11 refills | Status: DC

## 2023-01-21 NOTE — TOC Progression Note (Addendum)
Transition of Care [facility]) - Progression Note    Patient Details  Name: Cody Alexander MRN: [id number] Date of Birth: [birth year]  Transition [facility] CM/SW Contact  Margarito Liner, LCSW Phone Number: 01/21/2023, 8:32 AM  Clinical Narrative:  No bed offers this morning. Sent updated clinicals to try and trigger responses.   11:26 am: Asked [facility], [facility], and [facility] to review referral.    Barriers to Discharge: Continued Medical Work up  Expected Discharge Plan and Services       Living arrangements for the past 2 months: Single Family Home                                       Social Determinants of Health (SDOH) Interventions SDOH Screenings   Food Insecurity: Patient Unable To Answer (01/15/2023)  Housing: High Risk (01/15/2023)  Transportation Needs: No Transportation Needs (11/10/2022)  Utilities: Patient Unable To Answer (01/15/2023)  Tobacco Use: Medium Risk (01/05/2023)    Readmission Risk Interventions    06/25/2022   10:13 AM  Readmission Risk Prevention Plan  Transportation Screening Complete  PCP or Specialist Appt within 3-5 Days Complete  HRI or Home Care Consult Complete  Social Work Consult for Recovery Care Planning/Counseling Complete  Palliative Care Screening Not Applicable  Medication Review Oceanographer) Complete

## 2023-01-21 NOTE — Discharge Summary (Signed)
Cody Alexander ZOX:[id number] DOB: 05-16-1956 DOA: 01/05/2023  PCP: [facility]  Admit date: 01/05/2023 Discharge date: 01/21/2023  Time spent: 35 minutes  Recommendations for Outpatient Follow-up:  Pcp and cardiology f/u     Discharge Diagnoses:  Principal Problem:   NSTEMI (non-ST elevated myocardial infarction) [facility]) Active Problems:   Acute on chronic diastolic CHF (congestive heart failure) (HCC)   Acute on chronic respiratory failure with hypoxia (HCC)   CAD (coronary artery disease)   Urinary tract infection   Chronic obstructive pulmonary disease (COPD) (HCC)   Essential hypertension   Type 2 diabetes mellitus with hyperglycemia, with long-term current use of insulin (HCC)   Obesity (BMI 30-39.9)   Obesity, Class III, BMI 40-49.9 (morbid obesity) (HCC)   OSA (obstructive sleep apnea)   Benign prostatic hyperplasia with urinary hesitancy   Hypokalemia   History of kidney stones   Goals of care, counseling/discussion   Discharge Condition: stable  Diet recommendation: heart healthy carb modified  Filed Weights   01/19/23 0428 01/20/23 0704 01/21/23 0500  Weight: 132.6 kg 128.9 kg 127.2 kg    History of present illness:  From admission h and p  Cody Alexander is a 67 y.o. 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 O2 sats with EMS on 4L was 86% and patient was placed on NRB at 15L for transport transitioning to Bipap on arrival.   Hospital Course:  Patient presented with shortness of breath. Found to be hypoxic secondary to exacerbation of hfpef and NSTEMI. Failed bipap, required admission to ICU with intubation on 5/15. He was extubated on 5/23. He has now  been weaned to his 4 liters of home O2. Currently euvolemic. For his NSTEMI he was followed by cardiology, thought likely demand, offered patient left heart cath but patient declined. He is now euvolemic. He will follow up with cardiology in 1-2 weeks. He was also treated for hcap/copd exacerbation with a course of steroids and cefepime, this now appears resolved and as mentioned has been weaned to home 4 liters and is breathing comfortably. BPs initially soft, some home meds have been added back but will continue to hold lisinopril at discharge. Also requiring less insulin than his home doses so have reduced those doses at discharge. PT initially advising SNF but patient now much improved and desiring discharge home, PT re-assessment says stable for home with home health which we have ordered.   Procedures: intubation   Consultations: Cardiology  Discharge Exam: Vitals:   01/21/23 0510 01/21/23 0737  BP: 114/72   Pulse: 65   Resp: 17   Temp: 97.7 F (36.5 C)   SpO2: 100% 97%    General exam: Appears calm and comfortable  Respiratory system: few scattered rhonchi, rales right base Cardiovascular system: S1 & S2 heard, RRR. Soft systolic murmur. Distant heart sounds Gastrointestinal system: Abdomen is obese, soft and nontender. No organomegaly or masses felt. Normal bowel sounds heard. Central nervous system: Alert and oriented. Moving all 4 Extremities: Symmetric 5 x 5 power. No edema Skin: No rashes, lesions or ulcers Psychiatry: calm  Discharge Instructions   Discharge Instructions     Diet - low sodium heart healthy   Complete by: As  directed    Increase activity slowly   Complete by: As directed    No wound care   Complete by: As directed       Allergies as of 01/21/2023       Reactions   Erythromycin Anaphylaxis, Swelling   Had eye swelling with erythromycin during a time when he had perf ear drum. Tolerated azithromycin.        Medication List     STOP  taking these medications    furosemide 40 MG tablet Commonly known as: LASIX   gabapentin 600 MG tablet Commonly known as: NEURONTIN   insulin detemir 100 UNIT/ML FlexPen Commonly known as: LEVEMIR   Klor-Con M10 10 MEQ tablet Generic drug: potassium chloride   lisinopril 20 MG tablet Commonly known as: ZESTRIL       TAKE these medications    aspirin EC 81 MG tablet Generic drug: aspirin EC Take 81 mg by mouth daily.   atorvastatin 40 MG tablet Commonly known as: LIPITOR Take 40 mg by mouth daily.   budesonide-formoterol 160-4.5 MCG/ACT inhaler Commonly known as: SYMBICORT Inhale 2 puffs into the lungs 2 (two) times daily.   bumetanide 1 MG tablet Commonly known as: BUMEX Take 3 mg by mouth daily.   diclofenac Sodium 1 % Gel Commonly known as: VOLTAREN Apply 2 g topically in the morning and at bedtime.   docusate sodium 100 MG capsule Commonly known as: COLACE Take 1 capsule (100 mg total) by mouth 2 (two) times daily.   hydrOXYzine 10 MG tablet Commonly known as: ATARAX Take 1 tablet (10 mg total) by mouth 3 (three) times daily as needed. Home med.   insulin lispro 100 UNIT/ML KwikPen Commonly known as: HUMALOG Inject 10 Units into the skin 3 (three) times daily with meals.   ipratropium-albuterol 0.5-2.5 (3) MG/3ML Soln Commonly known as: DUONEB Take 3 mLs by nebulization every 6 (six) hours as needed.   isosorbide dinitrate 30 MG tablet Commonly known as: ISORDIL Take 30 mg by mouth every morning.   Lantus SoloStar 100 UNIT/ML Solostar Pen Generic drug: insulin glargine Inject 20 Units into the skin daily. What changed:  how much to take when to take this   magnesium oxide 400 MG tablet Commonly known as: MAG-OX Take 1 tablet by mouth 2 (two) times daily.   metFORMIN 1000 MG tablet Commonly known as: GLUCOPHAGE Take 1,000 mg by mouth daily.   metoprolol tartrate 25 MG tablet Commonly known as: LOPRESSOR Take 25 mg by mouth daily.    ProAir HFA 108 (90 Base) MCG/ACT inhaler Generic drug: albuterol Inhale 2 puffs into the lungs every 4 (four) hours as needed for wheezing.   albuterol (2.5 MG/3ML) 0.083% nebulizer solution Commonly known as: PROVENTIL Take 2.5 mg by nebulization every 4 (four) hours as needed.   Selenium Sulfide 2.25 % Sham SMARTSIG:Liberally Topical Daily   triamcinolone cream 0.1 % Commonly known as: KENALOG Apply 1 Application topically 2 (two) times daily.       Allergies  Allergen Reactions   Erythromycin Anaphylaxis and Swelling    Had eye swelling with erythromycin during a time when he had perf ear drum.  Tolerated azithromycin.    Follow-up Information     [facility] Follow up.   Contact information: [address] [place] [address] [phone]         Laurier Nancy, MD Follow up.   Specialty: Cardiology Contact information: [address] [place] [address]  [phone]                  The results of significant diagnostics from this hospitalization (including imaging, microbiology, ancillary and laboratory) are listed below for reference.    Significant Diagnostic Studies: DG Chest 1 View  Result Date: 01/15/2023 CLINICAL DATA:  Ventilator dependence. EXAM: CHEST  1 VIEW COMPARISON:  01/13/2023 FINDINGS: Endotracheal tube tip is approximately 6 cm above the base of the carina. The NG tube passes into the stomach although the distal tip position is not included on the film. Similar left base collapse/consolidation with left effusion. Improved aeration noted right lung base. Telemetry leads overlie the chest. IMPRESSION: 1. Endotracheal tube tip is approximately 6 cm above the base of the carina. 2. Similar left base collapse/consolidation with left effusion. Electronically Signed   By: Kennith Center M.D.   On: 01/15/2023 06:42   DG Chest Port 1 View  Result Date: 01/13/2023 CLINICAL DATA:  Acute on chronic hypoxic  respiratory failure EXAM: PORTABLE CHEST 1 VIEW COMPARISON:  01/10/2023 FINDINGS: The endotracheal tube tip is 6.0 cm above the carina. A nasogastric tube extends into the stomach. Continued airspace opacity in the left retrocardiac region and potentially in the lingula. Borderline cardiomegaly. A component of the left basilar opacity could be from pleural effusion although the appearance is overall nonspecific. Lower thoracic spondylosis. Right lung appears clear. Degenerative glenohumeral arthropathy on the right. IMPRESSION: 1. Continued airspace opacity in the left retrocardiac region and potentially in the lingula. A component of the left basilar opacity could be from pleural effusion although the appearance is overall nonspecific. 2. Borderline cardiomegaly. 3. Lower thoracic spondylosis. Electronically Signed   By: Gaylyn Rong M.D.   On: 01/13/2023 12:10   DG Chest Port 1 View  Result Date: 01/10/2023 CLINICAL DATA:  Intubation EXAM: PORTABLE CHEST 1 VIEW COMPARISON:  01/10/2023 at 0539 hours FINDINGS: Enteric tube terminates approximately 3.4 cm above the carina. Enteric tube courses below the diaphragm with distal tip beyond the inferior margin of the film. Stable cardiomegaly. Low lung volumes. Small left pleural effusion appears smaller than the previous study although differences may be secondary to more upright positioning on the current exam. Streaky left greater than right bibasilar opacities. Mild bilateral interstitial prominence slightly improving. No evidence of a pneumothorax. Numerous overlying monitoring leads. IMPRESSION: 1. Enteric tube terminates approximately 3.4 cm above the carina. 2. Small left pleural effusion appears smaller than the previous study although differences may be secondary to more upright positioning on the current exam. 3. Mild edema, slightly improving. Electronically Signed   By: Duanne Guess D.O.   On: 01/10/2023 19:58   DG Chest Port 1 View  Result  Date: 01/10/2023 CLINICAL DATA:  Acute on chronic hypoxic respiratory failure. EXAM: PORTABLE CHEST 1 VIEW COMPARISON:  AP chest 01/08/2023 (multiple studies), 01/05/2023; chest two views 11/09/2022 FINDINGS: Endotracheal tube tip terminates approximately 4.2 cm above the carina, in between the clavicular heads and the carina. Enteric tube descends below the diaphragm with the tip excluded by collimation, as on prior. Cardiac silhouette is again mildly enlarged. Mediastinal contours are grossly within normal limits for AP technique. Mild calcification within the aortic arch. Apparent mild increase in now mild-to-moderate left pleural effusion compared to most recent 01/08/2023 study at 1237 hours. There is mild bilateral interstitial thickening. Interval increase in fairly homogeneous density overlying the entire right lung may be artifactual or be related to vascular congestion. No pneumothorax. No acute skeletal abnormality. IMPRESSION: 1.  Apparent mild increase in now mild-to-moderate left pleural effusion compared to most recent 01/08/2023 study at 1237 hours. 2. Cardiomegaly with minimal interstitial pulmonary edema. Interval increase in fairly homogeneous density overlying the entire right lung may be artifactual or be related to vascular congestion. Electronically Signed   By: Neita Garnet M.D.   On: 01/10/2023 08:51   EEG adult  Result Date: 01/08/2023 Charlsie Quest, MD     01/08/2023  6:02 PM [name] MRN: [id number] Epilepsy Attending: Charlsie Quest Referring Physician/Provider: Ezequiel Essex, NP Date: 01/08/2023 Duration: 28.03 mins Patient history: 66yo m with ams getting eeg to evaluate for seizure Level of alertness: comatose/sedated AEDs during EEG study: propofol Technical aspects: This EEG study was done with scalp electrodes positioned according to the 10-20 International system of electrode placement. Electrical activity was reviewed with band pass filter of  1-70Hz , sensitivity of 7 uV/mm, display speed of 4mm/sec with a 60Hz  notched filter applied as appropriate. EEG data were recorded continuously and digitally stored.  Video monitoring was available and reviewed as appropriate. Description: EEG showed continuous generalized 3 to 6 Hz theta-delta slowing admixed with intermittent 12-13hz  frontocentral beta activity. Hyperventilation and photic stimulation were not performed.   ABNORMALITY - Continuous slow, generalized IMPRESSION: This study is suggestive of severe diffuse encephalopathy, nonspecific etiology. No seizures or epileptiform discharges were seen throughout the recording. Priyanka Annabelle Harman   CT HEAD WO CONTRAST ( )  Result Date: 01/08/2023 CLINICAL DATA:  Altered mental status. EXAM: CT HEAD WITHOUT CONTRAST TECHNIQUE: Contiguous axial images were obtained from the base of the skull through the vertex without intravenous contrast. RADIATION DOSE REDUCTION: This exam was performed according to the departmental dose-optimization program which includes automated exposure control, adjustment of the mA and/or kV according to patient size and/or use of iterative reconstruction technique. COMPARISON:  06/19/2022 FINDINGS: Brain: No evidence of acute infarction, hemorrhage, mass, mass effect, or midline shift. No hydrocephalus or extra-axial fluid collection. Vascular: No hyperdense vessel. Skull: Negative for fracture or focal lesion. Sinuses/Orbits: Minimal mucosal thickening in the maxillary sinuses. More significant mucosal thickening and bubbly fluid in the ethmoid air cells and right greater than left sphenoid sinus. Hypoplastic frontal sinuses. No acute finding in the orbits. Similar proptosis. Other: The mastoid air cells are well aerated. IMPRESSION: 1. No acute intracranial process. 2. Mucosal thickening and bubbly fluid in the ethmoid air cells and right greater than left sphenoid sinus. Correlate for signs/symptoms of acute sinusitis.  Electronically Signed   By: Wiliam Ke M.D.   On: 01/08/2023 15:04   DG Chest Port 1 View  Result Date: 01/08/2023 CLINICAL DATA:  Hypoxia EXAM: PORTABLE CHEST 1 VIEW COMPARISON:  01/05/2023 FINDINGS: Transverse diameter of heart is increased. There are no signs of alveolar pulmonary edema. There is no focal pulmonary consolidation. Small linear densities are seen in left parahilar region. There is no significant pleural effusion or pneumothorax. Marked degenerative changes are noted in right shoulder. IMPRESSION: Cardiomegaly. There are no signs of alveolar pulmonary edema or focal pulmonary consolidation. Small linear densities in left parahilar region may suggest minimal scarring. Electronically Signed   By: Ernie Avena M.D.   On: 01/08/2023 13:09   DG Abd Portable 1V  Result Date: 01/08/2023 CLINICAL DATA:  NG tube placement EXAM: PORTABLE ABDOMEN - 1 VIEW COMPARISON:  None Available. FINDINGS: Tip of NG tube is seen in the fundus of the stomach. Right side of the abdomen and pelvis are not included in its  entirety. Bowel gas pattern is nonspecific. There are patchy linear densities in the lower lung fields. Degenerative changes are noted in lumbar spine and visualized lower thoracic spine. IMPRESSION: Tip of NG tube is seen in the fundus of the stomach. Electronically Signed   By: Ernie Avena M.D.   On: 01/08/2023 13:06   DG Chest Port 1 View  Result Date: 01/08/2023 CLINICAL DATA:  Status post intubation and NG tube placement. EXAM: PORTABLE CHEST 1 VIEW COMPARISON:  01/08/2023 FINDINGS: Interval placement of ET tube with tip 3.2 cm above the carina. Enteric tube tip and side port are below the level of the GE junction. Stable cardiomediastinal contours. New opacity within the periphery of the left lung base is identified which may reflect pleural effusion, atelectasis and or airspace disease. Pulmonary vascular congestion identified. IMPRESSION: 1. Satisfactory position of ET  tube and enteric tube. 2. New opacity within the periphery of the left lung base which may reflect pleural effusion, atelectasis and/or airspace disease. 3. Persistent pulmonary vascular congestion. Electronically Signed   By: Signa Kell M.D.   On: 01/08/2023 13:06   ECHOCARDIOGRAM COMPLETE  Result Date: 01/07/2023    ECHOCARDIOGRAM REPORT   Patient Name:   RAISHAWN NAZAIRE Date of Exam: 01/07/2023 Medical Rec #:  [id number]         Height:       73.0 in Accession #:    [id number]        Weight:       300.0 lb Date of Birth:  [birth year]         BSA:          2.556 m Patient Age:    66 years          BP:           129/86 mmHg Patient Gender: M                 HR:           77 bpm. Exam Location:  [facility] Procedure: 2D Echo, Cardiac Doppler and Color Doppler Indications:     NSTEMI  History:         Patient has prior history of Echocardiogram examinations, most                  recent 11/08/2022. CHF, Acute MI, CAD and Previous Myocardial                  Infarction, COPD, Signs/Symptoms:Chest Pain and Syncope; Risk                  Factors:Hypertension, Sleep Apnea, Diabetes and Dyslipidemia.  Sonographer:     Mikki Harbor Referring Phys:  [id number] Andris Baumann Diagnosing Phys: Yvonne Kendall MD  Sonographer Comments: Technically difficult study due to poor echo windows and patient is obese. Image acquisition challenging due to COPD. IMPRESSIONS  1. Left ventricular ejection fraction, by estimation, is 65 to 70%. The left ventricle has normal function. The left ventricle has no regional wall motion abnormalities. There is severe left ventricular hypertrophy. Left ventricular diastolic parameters  are consistent with Grade II diastolic dysfunction (pseudonormalization).  2. Right ventricular systolic function is normal. The right ventricular size is mildly enlarged. Tricuspid regurgitation signal is inadequate for assessing PA pressure.  3. Right atrial size was mild to moderately dilated.  4. The mitral  valve is grossly normal. Trivial mitral valve regurgitation.  5. The aortic valve is tricuspid. Aortic valve regurgitation is  not visualized. No aortic stenosis is present.  6. Aortic dilatation noted. There is mild dilatation of the aortic root, measuring 42 mm. FINDINGS  Left Ventricle: Left ventricular ejection fraction, by estimation, is 65 to 70%. The left ventricle has normal function. The left ventricle has no regional wall motion abnormalities. The left ventricular internal cavity size was normal in size. There is  severe left ventricular hypertrophy. Left ventricular diastolic parameters are consistent with Grade II diastolic dysfunction (pseudonormalization). Right Ventricle: The right ventricular size is mildly enlarged. No increase in right ventricular wall thickness. Right ventricular systolic function is normal. Tricuspid regurgitation signal is inadequate for assessing PA pressure. Left Atrium: Left atrial size was normal in size. Right Atrium: Right atrial size was mild to moderately dilated. Pericardium: There is no evidence of pericardial effusion. Mitral Valve: The mitral valve is grossly normal. Mild mitral annular calcification. Trivial mitral valve regurgitation. MV peak gradient, 3.1 mmHg. The mean mitral valve gradient is 1.0 mmHg. Tricuspid Valve: The tricuspid valve is grossly normal. Tricuspid valve regurgitation is mild. Aortic Valve: The aortic valve is tricuspid. Aortic valve regurgitation is not visualized. No aortic stenosis is present. Aortic valve mean gradient measures 4.0 mmHg. Aortic valve peak gradient measures 7.6 mmHg. Aortic valve area, by VTI measures 3.73 cm. Pulmonic Valve: The pulmonic valve was not well visualized. Pulmonic valve regurgitation is not visualized. No evidence of pulmonic stenosis. Aorta: Aortic dilatation noted. There is mild dilatation of the aortic root, measuring 42 mm. Pulmonary Artery: The pulmonary artery is not well seen. Venous: The inferior vena  cava was not well visualized. IAS/Shunts: No atrial level shunt detected by color flow Doppler.  LEFT VENTRICLE PLAX 2D LVIDd:         5.40 cm   Diastology LVIDs:         2.80 cm   LV e' medial:    6.31 cm/s LV PW:         1.70 cm   LV E/e' medial:  11.5 LV IVS:        2.00 cm   LV e' lateral:   6.42 cm/s LVOT diam:     2.20 cm   LV E/e' lateral: 11.3 LV SV:         118 LV SV Index:   46 LVOT Area:     3.80 cm  RIGHT VENTRICLE RV Basal diam:  4.35 cm RV Mid diam:    3.60 cm RV S prime:     14.30 cm/s TAPSE (M-mode): 3.2 cm LEFT ATRIUM           Index        RIGHT ATRIUM           Index LA diam:      4.00 cm 1.56 cm/m   RA Area:     27.60 cm LA Vol (A2C): 80.1 ml 31.33 ml/m  RA Volume:   97.10 ml  37.99 ml/m LA Vol (A4C): 43.5 ml 17.02 ml/m  AORTIC VALVE                    PULMONIC VALVE AV Area (Vmax):    3.72 cm     PV Vmax:       0.82 m/s AV Area (Vmean):   3.95 cm     PV Peak grad:  2.7 mmHg AV Area (VTI):     3.73 cm AV Vmax:           138.00 cm/s AV Vmean:  88.700 cm/s AV VTI:            0.317 m AV Peak Grad:      7.6 mmHg AV Mean Grad:      4.0 mmHg LVOT Vmax:         135.00 cm/s LVOT Vmean:        92.100 cm/s LVOT VTI:          0.311 m LVOT/AV VTI ratio: 0.98  AORTA Ao Root diam: 4.20 cm MITRAL VALVE MV Area (PHT): 2.92 cm    SHUNTS MV Area VTI:   5.45 cm    Systemic VTI:  0.31 m MV Peak grad:  3.1 mmHg    Systemic Diam: 2.20 cm MV Mean grad:  1.0 mmHg MV Vmax:       0.88 m/s MV Vmean:      54.7 cm/s MV Decel Time: 260 msec MV E velocity: 72.30 cm/s MV A velocity: 68.70 cm/s MV E/A ratio:  1.05 Yvonne Kendall MD Electronically signed by Yvonne Kendall MD Signature Date/Time: 01/07/2023/6:29:26 PM    Final    CT Renal Stone Study  Result Date: 01/05/2023 CLINICAL DATA:  Hematuria. EXAM: CT ABDOMEN AND PELVIS WITHOUT CONTRAST TECHNIQUE: Multidetector CT imaging of the abdomen and pelvis was performed following the standard protocol without IV contrast. RADIATION DOSE REDUCTION: This exam  was performed according to the departmental dose-optimization program which includes automated exposure control, adjustment of the mA and/or kV according to patient size and/or use of iterative reconstruction technique. COMPARISON:  CT angio chest same date. FINDINGS: Lower chest: Mild bibasilar scarring. Hepatobiliary: No focal hepatic lesion. Normal gallbladder. No biliary duct dilatation. Common bile duct is normal. Pancreas: Pancreas is normal. No ductal dilatation. No pancreatic inflammation. Spleen: Normal spleen Adrenals/urinary tract: Mild thickening LEFT adrenal gland suggests hyperplasia. There is contrast in the collecting system related to CTA same day. No nephrolithiasis identified. No ureterolithiasis. The ureters are opacified. No obstructive uropathy. Small amount of gas in bladder related procedure presumably. Several small diverticula of the bladder. Stomach/Bowel: Stomach, small bowel, appendix, and cecum are normal. The colon and rectosigmoid colon are normal. Vascular/Lymphatic: Abdominal aorta is normal caliber. No periportal or retroperitoneal adenopathy. No pelvic adenopathy. Reproductive: Prostate unremarkable Other: No free fluid. Musculoskeletal: No aggressive osseous lesion. IMPRESSION: 1. No nephrolithiasis, ureterolithiasis, or obstructive uropathy. Collecting systems filled with contrast. 2. Small amount of gas in the bladder presumably related to procedure. 3. Small bladder diverticula. Electronically Signed   By: Genevive Bi M.D.   On: 01/05/2023 21:05   CT Angio Chest PE W and/or Wo Contrast  Result Date: 01/05/2023 CLINICAL DATA:  Short of breath.  Home oxygen. EXAM: CT ANGIOGRAPHY CHEST WITH CONTRAST TECHNIQUE: Multidetector CT imaging of the chest was performed using the standard protocol during bolus administration of intravenous contrast. Multiplanar CT image reconstructions and MIPs were obtained to evaluate the vascular anatomy. RADIATION DOSE REDUCTION: This exam  was performed according to the departmental dose-optimization program which includes automated exposure control, adjustment of the mA and/or kV according to patient size and/or use of iterative reconstruction technique. CONTRAST:  75mL OMNIPAQUE IOHEXOL 350 MG/ML SOLN COMPARISON:  CT 11/09/2022 FINDINGS: Cardiovascular: No filling defects within the pulmonary arteries to suggest acute pulmonary embolism. Mediastinum/Nodes: No axillary or supraclavicular adenopathy. No mediastinal or hilar adenopathy. No pericardial fluid. Esophagus normal. Lungs/Pleura: No pulmonary infarction. No pneumonia. No pleural fluid. No pneumothorax Upper Abdomen: Limited view of the liver, kidneys, pancreas are unremarkable. Normal adrenal glands. Musculoskeletal: No  acute osseous abnormality. Subcutaneous cyst in the central upper back measures 4 cm Review of the MIP images confirms the above findings. IMPRESSION: 1. No evidence acute pulmonary embolism. 2. No acute pulmonary parenchymal findings. Electronically Signed   By: Genevive Bi M.D.   On: 01/05/2023 18:05   DG Chest Portable 1 View  Result Date: 01/05/2023 CLINICAL DATA:  Short of breath, respiratory distress EXAM: PORTABLE CHEST 1 VIEW COMPARISON:  None Available. FINDINGS: Stable enlarged cardiac silhouette. There is central venous congestion mild interstitial edema pattern. No pleural fluid. No focal consolidation. No pneumothorax. IMPRESSION: Cardiomegaly and mild interstitial edema. Electronically Signed   By: Genevive Bi M.D.   On: 01/05/2023 16:28    Microbiology: Recent Results (from the past 240 hour(s))  Culture, Respiratory w Gram Stain     Status: None   Collection Time: 01/14/23  5:28 PM   Specimen: Tracheal Aspirate; Respiratory  Result Value Ref Range Status   Specimen Description   Final    TRACHEAL ASPIRATE Performed at [facility], [address] [place], [place] [address]    Special Requests   Final     Immunocompromised Performed at [facility], [address] [place], [place] [address]    Gram Stain   Final    RARE WBC PRESENT, PREDOMINANTLY PMN NO ORGANISMS SEEN    Culture   Final    RARE Normal respiratory flora-no Staph aureus or Pseudomonas seen Performed at [facility], [address] [place], [place] [address]    Report Status 01/17/2023 FINAL  Final     Labs: Basic Metabolic Panel: Recent Labs  Lab 01/15/23 0658 01/16/23 0442 01/17/23 0611 01/18/23 0422 01/19/23 0407 01/21/23 0402  NA 151* 148* 149* 143 139 137  K 5.0 4.3 4.1 3.8 3.4* 3.8  CL 114* 114* 115* 107 100 97*  CO2 30 26 27 28 31  32  GLUCOSE 202* 132* 132* 133* 162* 137*  BUN 52* 47* 35* 29* 29* 32*  CREATININE 0.99 0.85 0.87 0.74 0.89 0.87  CALCIUM 9.0 8.7* 8.9 8.7* 8.5* 8.6*  MG 2.7* 2.7*  --   --   --   --    Liver Function Tests: No results for input(s): "AST", "ALT", "ALKPHOS", "BILITOT", "PROT", "ALBUMIN" in the last 168 hours. No results for input(s): "LIPASE", "AMYLASE" in the last 168 hours. No results for input(s): "AMMONIA" in the last 168 hours. CBC: Recent Labs  Lab 01/15/23 0658 01/16/23 0442 01/17/23 0611 01/21/23 0402  WBC 16.4* 8.3 10.6* 8.8  HGB 13.0 12.3* 13.2 14.7  HCT 43.0 41.2 43.7 45.7  MCV 94.5 94.3 93.4 88.6  PLT 341 242 303 270   Cardiac Enzymes: No results for input(s): "CKTOTAL", "CKMB", "CKMBINDEX", "TROPONINI" in the last 168 hours. BNP: BNP (last 3 results) Recent Labs    11/09/22 1723 01/05/23 1535 01/07/23 2256  BNP 82.7 272.1* 59.7    ProBNP (last 3 results) No results for input(s): "PROBNP" in the last 8760 hours.  CBG: Recent Labs  Lab 01/20/23 0841 01/20/23 1223 01/20/23 1549 01/20/23 2022 01/21/23 0801  GLUCAP 143* 162* 212* 168* 107*       Signed:  Silvano Bilis MD.  [facility] 01/21/2023, 1:46 PM

## 2023-01-21 NOTE — Plan of Care (Signed)
  Problem: Education: Goal: Ability to describe self-care measures that may prevent or decrease complications (Diabetes Survival Skills Education) will improve Outcome: Progressing   Problem: Coping: Goal: Ability to adjust to condition or change in health will improve Outcome: Progressing   Problem: Fluid Volume: Goal: Ability to maintain a balanced intake and output will improve Outcome: Progressing   Problem: Health Behavior/Discharge Planning: Goal: Ability to identify and utilize available resources and services will improve Outcome: Progressing Goal: Ability to manage health-related needs will improve Outcome: Progressing   Problem: Metabolic: Goal: Ability to maintain appropriate glucose levels will improve Outcome: Progressing   Problem: Nutritional: Goal: Maintenance of adequate nutrition will improve Outcome: Progressing Goal: Progress toward achieving an optimal weight will improve Outcome: Progressing   Problem: Skin Integrity: Goal: Risk for impaired skin integrity will decrease Outcome: Progressing   Problem: Tissue Perfusion: Goal: Adequacy of tissue perfusion will improve Outcome: Progressing   Problem: Education: Goal: Understanding of cardiac disease, CV risk reduction, and recovery process will improve Outcome: Progressing   Problem: Activity: Goal: Ability to tolerate increased activity will improve Outcome: Progressing   Problem: Cardiac: Goal: Ability to achieve and maintain adequate cardiovascular perfusion will improve Outcome: Progressing   Problem: Health Behavior/Discharge Planning: Goal: Ability to safely manage health-related needs after discharge will improve Outcome: Progressing   Problem: Education: Goal: Knowledge of General Education information will improve Description: Including pain rating scale, medication(s)/side effects and non-pharmacologic comfort measures Outcome: Progressing   Problem: Health Behavior/Discharge  Planning: Goal: Ability to manage health-related needs will improve Outcome: Progressing   Problem: Clinical Measurements: Goal: Ability to maintain clinical measurements within normal limits will improve Outcome: Progressing Goal: Will remain free from infection Outcome: Progressing Goal: Diagnostic test results will improve Outcome: Progressing Goal: Respiratory complications will improve Outcome: Progressing Goal: Cardiovascular complication will be avoided Outcome: Progressing   Problem: Activity: Goal: Risk for activity intolerance will decrease Outcome: Progressing   Problem: Nutrition: Goal: Adequate nutrition will be maintained Outcome: Progressing   Problem: Coping: Goal: Level of anxiety will decrease Outcome: Progressing   Problem: Elimination: Goal: Will not experience complications related to bowel motility Outcome: Progressing Goal: Will not experience complications related to urinary retention Outcome: Progressing   Problem: Pain Managment: Goal: General experience of comfort will improve Outcome: Progressing   Problem: Safety: Goal: Ability to remain free from injury will improve Outcome: Progressing   Problem: Skin Integrity: Goal: Risk for impaired skin integrity will decrease Outcome: Progressing

## 2023-01-21 NOTE — TOC Transition Note (Addendum)
Transition of Care [facility]) - CM/SW Discharge Note   Patient Details  Name: Cody Alexander MRN: [id number] Date of Birth: 1956-05-27  Transition of Care [facility] CM/SW Contact:  Margarito Liner, LCSW Phone Number: 01/21/2023, 2:40 PM   Clinical Narrative:   Patient has orders to discharge home today. He is already active with [facility] for PT, OT, RN. Liaison is aware he's leaving today. Patient confirmed he has a ride home and they will bring his oxygen. No further concerns. CSW signing off.  4:56 pm: Patient's ride has not arrived yet. Per previous notes, he gets his oxygen through [facility]. Customer service liaison will ask her supervisor if they can bring a tank to the hospital if he needs Safe Transport home.  5:09 pm: [facility] said patient's oxygen orders expired in April of last year. Asked RN to complete sats test and then for MD to enter DME order so they could get updated orders. Patient is aware. He is still trying to reach his ride. If he needs Safe Transport home, the address on the facesheet is correct.  Final next level of care: Home w Home Health Services Barriers to Discharge: Barriers Resolved   Patient Goals and CMS Choice CMS Medicare.gov Compare Post Acute Care list provided to:: Patient Represenative (must comment) (family)    Discharge Placement                      Patient and family notified of of transfer: 01/21/23  Discharge Plan and Services Additional resources added to the After Visit Summary for                            [facility] Arranged: RN, PT, OT [facility] Agency: [facility] Date [facility] Agency Contacted: 01/21/23   Representative spoke with at [facility] Agency: Gearldine Bienenstock  Social Determinants of Health (SDOH) Interventions SDOH Screenings   Food Insecurity: Patient Unable To Answer (01/15/2023)  Housing: High Risk (01/15/2023)  Transportation Needs: No Transportation Needs (11/10/2022)  Utilities: Patient Unable To Answer (01/15/2023)  Tobacco Use: Medium  Risk (01/05/2023)     Readmission Risk Interventions    06/25/2022   10:13 AM  Readmission Risk Prevention Plan  Transportation Screening Complete  PCP or Specialist Appt within 3-5 Days Complete  HRI or Home Care Consult Complete  Social Work Consult for Recovery Care Planning/Counseling Complete  Palliative Care Screening Not Applicable  Medication Review Oceanographer) Complete

## 2023-01-21 NOTE — Progress Notes (Signed)
SUBJECTIVE: Doing well  Vitals:   01/20/23 2349 01/21/23 0500 01/21/23 0510 01/21/23 0737  BP: 103/62  114/72   Pulse: 69  65   Resp: 16  17   Temp: 97.8 F (36.6 C)  97.7 F (36.5 C)   TempSrc:      SpO2: 98%  100% 97%  Weight:  127.2 kg    Height:        Intake/Output Summary (Last 24 hours) at 01/21/2023 0750 Last data filed at 01/21/2023 0509 Gross per 24 hour  Intake 720 ml  Output 1500 ml  Net -780 ml    LABS: Basic Metabolic Panel: Recent Labs    01/19/23 0407 01/21/23 0402  NA 139 137  K 3.4* 3.8  CL 100 97*  CO2 31 32  GLUCOSE 162* 137*  BUN 29* 32*  CREATININE 0.89 0.87  CALCIUM 8.5* 8.6*   Liver Function Tests: No results for input(s): "AST", "ALT", "ALKPHOS", "BILITOT", "PROT", "ALBUMIN" in the last 72 hours. No results for input(s): "LIPASE", "AMYLASE" in the last 72 hours. CBC: Recent Labs    01/21/23 0402  WBC 8.8  HGB 14.7  HCT 45.7  MCV 88.6  PLT 270   Cardiac Enzymes: No results for input(s): "CKTOTAL", "CKMB", "CKMBINDEX", "TROPONINI" in the last 72 hours. BNP: Invalid input(s): "POCBNP" D-Dimer: No results for input(s): "DDIMER" in the last 72 hours. Hemoglobin A1C: No results for input(s): "HGBA1C" in the last 72 hours. Fasting Lipid Panel: No results for input(s): "CHOL", "HDL", "LDLCALC", "TRIG", "CHOLHDL", "LDLDIRECT" in the last 72 hours. Thyroid Function Tests: No results for input(s): "TSH", "T4TOTAL", "T3FREE", "THYROIDAB" in the last 72 hours.  Invalid input(s): "FREET3" Anemia Panel: No results for input(s): "VITAMINB12", "FOLATE", "FERRITIN", "TIBC", "IRON", "RETICCTPCT" in the last 72 hours.   PHYSICAL EXAM General: Well developed, well nourished, in no acute distress HEENT:  Normocephalic and atramatic Neck:  No JVD.  Lungs: Clear bilaterally to auscultation and percussion. Heart: HRRR . Normal S1 and S2 without gallops or murmurs.  Abdomen: Bowel sounds are positive, abdomen soft and non-tender  Msk:  Back  normal, normal gait. Normal strength and tone for age. Extremities: No clubbing, cyanosis or edema.   Neuro: Alert and oriented X 3. Psych:  Good affect, responds appropriately  TELEMETRY: NSR  ASSESSMENT AND PLAN: NSTEMI, advise cath but refused, but does want to follow up in office upon discharge.Give f/u 1-2 weeks.   ICD-10-CM   1. Respiratory distress  R06.03     2. Congestive heart failure, unspecified HF chronicity, unspecified heart failure type (HCC)  I50.9     3. Lower urinary tract infection  N39.0       Principal Problem:   NSTEMI (non-ST elevated myocardial infarction) (HCC) Active Problems:   Chronic obstructive pulmonary disease (COPD) (HCC)   Type 2 diabetes mellitus with hyperglycemia, with long-term current use of insulin (HCC)   Essential hypertension   Obesity, Class III, BMI 40-49.9 (morbid obesity) (HCC)   Acute on chronic respiratory failure with hypoxia (HCC)   Acute on chronic diastolic CHF (congestive heart failure) (HCC)   OSA (obstructive sleep apnea)   Obesity (BMI 30-39.9)   Benign prostatic hyperplasia with urinary hesitancy   CAD (coronary artery disease)   Urinary tract infection   Hypokalemia   History of kidney stones   Goals of care, counseling/discussion    Adrian Blackwater, MD, [facility] 01/21/2023 7:50 AM

## 2023-01-21 NOTE — Plan of Care (Signed)
Problem: Education: Goal: Ability to describe self-care measures that may prevent or decrease complications (Diabetes Survival Skills Education) will improve 01/21/2023 1409 by Genia Hotter, RN Outcome: Adequate for Discharge 01/21/2023 [phone] by Genia Hotter, RN Outcome: Progressing   Problem: Coping: Goal: Ability to adjust to condition or change in health will improve 01/21/2023 1409 by Genia Hotter, RN Outcome: Adequate for Discharge 01/21/2023 [phone] by Genia Hotter, RN Outcome: Progressing   Problem: Fluid Volume: Goal: Ability to maintain a balanced intake and output will improve 01/21/2023 1409 by Genia Hotter, RN Outcome: Adequate for Discharge 01/21/2023 0837 by Genia Hotter, RN Outcome: Progressing   Problem: Health Behavior/Discharge Planning: Goal: Ability to identify and utilize available resources and services will improve 01/21/2023 1409 by Genia Hotter, RN Outcome: Adequate for Discharge 01/21/2023 [phone] by Genia Hotter, RN Outcome: Progressing Goal: Ability to manage health-related needs will improve 01/21/2023 1409 by Genia Hotter, RN Outcome: Adequate for Discharge 01/21/2023 [phone] by Genia Hotter, RN Outcome: Progressing   Problem: Metabolic: Goal: Ability to maintain appropriate glucose levels will improve 01/21/2023 1409 by Genia Hotter, RN Outcome: Adequate for Discharge 01/21/2023 [phone] by Genia Hotter, RN Outcome: Progressing   Problem: Nutritional: Goal: Maintenance of adequate nutrition will improve 01/21/2023 1409 by Genia Hotter, RN Outcome: Adequate for Discharge 01/21/2023 [phone] by Genia Hotter, RN Outcome: Progressing Goal: Progress toward achieving an optimal weight will improve 01/21/2023 1409 by Genia Hotter, RN Outcome: Adequate for Discharge 01/21/2023 [phone] by Genia Hotter, RN Outcome: Progressing   Problem: Skin Integrity: Goal: Risk for impaired skin integrity will decrease 01/21/2023 1409 by Genia Hotter, RN Outcome: Adequate  for Discharge 01/21/2023 [phone] by Genia Hotter, RN Outcome: Progressing   Problem: Tissue Perfusion: Goal: Adequacy of tissue perfusion will improve 01/21/2023 1409 by Genia Hotter, RN Outcome: Adequate for Discharge 01/21/2023 [phone] by Genia Hotter, RN Outcome: Progressing   Problem: Education: Goal: Understanding of cardiac disease, CV risk reduction, and recovery process will improve 01/21/2023 1409 by Genia Hotter, RN Outcome: Adequate for Discharge 01/21/2023 [phone] by Genia Hotter, RN Outcome: Progressing   Problem: Activity: Goal: Ability to tolerate increased activity will improve 01/21/2023 1409 by Genia Hotter, RN Outcome: Adequate for Discharge 01/21/2023 [phone] by Genia Hotter, RN Outcome: Progressing   Problem: Cardiac: Goal: Ability to achieve and maintain adequate cardiovascular perfusion will improve 01/21/2023 1409 by Genia Hotter, RN Outcome: Adequate for Discharge 01/21/2023 [phone] by Genia Hotter, RN Outcome: Progressing   Problem: Health Behavior/Discharge Planning: Goal: Ability to safely manage health-related needs after discharge will improve 01/21/2023 1409 by Genia Hotter, RN Outcome: Adequate for Discharge 01/21/2023 [phone] by Genia Hotter, RN Outcome: Progressing   Problem: Education: Goal: Knowledge of General Education information will improve Description: Including pain rating scale, medication(s)/side effects and non-pharmacologic comfort measures 01/21/2023 1409 by Genia Hotter, RN Outcome: Adequate for Discharge 01/21/2023 [phone] by Genia Hotter, RN Outcome: Progressing   Problem: Health Behavior/Discharge Planning: Goal: Ability to manage health-related needs will improve 01/21/2023 1409 by Genia Hotter, RN Outcome: Adequate for Discharge 01/21/2023 [phone] by Genia Hotter, RN Outcome: Progressing   Problem: Clinical Measurements: Goal: Ability to maintain clinical measurements within normal limits will improve 01/21/2023 1409 by Genia Hotter, RN Outcome: Adequate for Discharge 01/21/2023 [phone] by Genia Hotter, RN Outcome: Progressing Goal: Will remain free from infection 01/21/2023 1409 by Genia Hotter, RN Outcome: Adequate for Discharge 01/21/2023 [phone] by Genia Hotter, RN Outcome: Progressing Goal: Diagnostic test results will improve 01/21/2023 1409 by Genia Hotter, RN Outcome: Adequate for Discharge 01/21/2023 [phone]  by Genia Hotter, RN Outcome: Progressing Goal: Respiratory complications will improve 01/21/2023 1409 by Genia Hotter, RN Outcome: Adequate for Discharge 01/21/2023 [phone] by Genia Hotter, RN Outcome: Progressing Goal: Cardiovascular complication will be avoided 01/21/2023 1409 by Genia Hotter, RN Outcome: Adequate for Discharge 01/21/2023 [phone] by Genia Hotter, RN Outcome: Progressing   Problem: Activity: Goal: Risk for activity intolerance will decrease 01/21/2023 1409 by Genia Hotter, RN Outcome: Adequate for Discharge 01/21/2023 [phone] by Genia Hotter, RN Outcome: Progressing   Problem: Nutrition: Goal: Adequate nutrition will be maintained 01/21/2023 1409 by Genia Hotter, RN Outcome: Adequate for Discharge 01/21/2023 [phone] by Genia Hotter, RN Outcome: Progressing   Problem: Coping: Goal: Level of anxiety will decrease 01/21/2023 1409 by Genia Hotter, RN Outcome: Adequate for Discharge 01/21/2023 [phone] by Genia Hotter, RN Outcome: Progressing   Problem: Elimination: Goal: Will not experience complications related to bowel motility 01/21/2023 1409 by Genia Hotter, RN Outcome: Adequate for Discharge 01/21/2023 [phone] by Genia Hotter, RN Outcome: Progressing Goal: Will not experience complications related to urinary retention 01/21/2023 1409 by Genia Hotter, RN Outcome: Adequate for Discharge 01/21/2023 [phone] by Genia Hotter, RN Outcome: Progressing   Problem: Pain Managment: Goal: General experience of comfort will improve 01/21/2023 1409 by Genia Hotter, RN Outcome: Adequate for  Discharge 01/21/2023 [phone] by Genia Hotter, RN Outcome: Progressing   Problem: Safety: Goal: Ability to remain free from injury will improve 01/21/2023 1409 by Genia Hotter, RN Outcome: Adequate for Discharge 01/21/2023 [phone] by Genia Hotter, RN Outcome: Progressing   Problem: Skin Integrity: Goal: Risk for impaired skin integrity will decrease 01/21/2023 1409 by Genia Hotter, RN Outcome: Adequate for Discharge 01/21/2023 [phone] by Genia Hotter, RN Outcome: Progressing

## 2023-01-21 NOTE — Progress Notes (Signed)
Physical Therapy Treatment Patient Details Name: Yukio Lewark MRN: [id number] DOB: Apr 04, 1956 Today's Date: 01/21/2023   History of Present Illness presented to ER secondary to respiratory distress and chest pain; admitted for management of acute encephalopathy, UTI, NSTEMI and hypercapnic respiratory failure requiring intubation 5/15-5/23    PT Comments    Pt received up in bedside chair 96% on 4L O2. Pt states he is feeling much better and anxious to return home. Pt demonstrated good strength and balance with sit<>stand transfers and gait training with RW 80+ ft with Supervision. No SOB on 4L O2, SpO2 dropped briefly to 90% and quickly returned to mid 90's upon sitting. Pt has made significant functional improvement since admission and appears close to functional baseline. Pt has a hospital bed at home and RW.  Titrated pt down to 3L O2 (baseline) with SpO2 remaining in mid to upper 90's. Will continue to progress acutely. Progress communicated with primary PT and multi-disciplinary team.    Recommendations for follow up therapy are one component of a multi-disciplinary discharge planning process, led by the attending physician.  Recommendations may be updated based on patient status, additional functional criteria and insurance authorization.  Follow Up Recommendations  Can patient physically be transported by private vehicle: Yes    Assistance Recommended at Discharge PRN  Patient can return home with the following A little help with walking and/or transfers;A little help with bathing/dressing/bathroom;Assist for transportation;Help with stairs or ramp for entrance   Equipment Recommendations  None recommended by PT    Recommendations for Other Services       Precautions / Restrictions Precautions Precautions: Fall Restrictions Weight Bearing Restrictions: No     Mobility  Bed Mobility Overal bed mobility: Needs Assistance Bed Mobility: Supine to Sit, Sit to Supine      Supine to sit: Supervision, HOB elevated (with railing)     General bed mobility comments: Pt has a hospital bed at home    Transfers Overall transfer level: Needs assistance Equipment used: Rolling walker (2 wheels) Transfers: Sit to/from Stand Sit to Stand: Supervision           General transfer comment:  (No physical assist from low recliner)    Ambulation/Gait Ambulation/Gait assistance: Supervision Gait Distance (Feet): 85 Feet Assistive device: Rolling walker (2 wheels) Gait Pattern/deviations: Step-through pattern, Trunk flexed, Wide base of support Gait velocity: decreased     General Gait Details: Significant improvement since last PT session   Stairs             Wheelchair Mobility    Modified Rankin (Stroke Patients Only)       Balance Overall balance assessment: Needs assistance Sitting-balance support: No upper extremity supported, Feet supported Sitting balance-Leahy Scale: Good     Standing balance support: Bilateral upper extremity supported Standing balance-Leahy Scale: Fair Standing balance comment:  (No LOB while ambulating in hall with RW)                            Cognition Arousal/Alertness: Awake/alert Behavior During Therapy: WFL for tasks assessed/performed Overall Cognitive Status: Within Functional Limits for tasks assessed                                 General Comments: Alert and oriented to basic information; follows commands;        Exercises      General Comments General comments (  skin integrity, edema, etc.): Pt on 4L O2 upon arrival 96% at rest, brief dip to 90% after gait training, quickly returning to mid 90's, no c/o SOB      Pertinent Vitals/Pain Pain Assessment Pain Assessment: No/denies pain    Home Living                          Prior Function            PT Goals (current goals can now be found in the care plan section) Acute Rehab PT Goals Patient  Stated Goal: to get stronger and walk again Progress towards PT goals: Progressing toward goals    Frequency    Min 2X/week      PT Plan      Co-evaluation              AM-PAC PT "6 Clicks" Mobility   Outcome Measure  Help needed turning from your back to your side while in a flat bed without using bedrails?: A Little Help needed moving from lying on your back to sitting on the side of a flat bed without using bedrails?: A Little Help needed moving to and from a bed to a chair (including a wheelchair)?: A Little Help needed standing up from a chair using your arms (e.g., wheelchair or bedside chair)?: A Little Help needed to walk in hospital room?: A Little Help needed climbing 3-5 steps with a railing? : A Little 6 Click Score: 18    End of Session Equipment Utilized During Treatment: Gait belt;Oxygen (3-4L O2 via [place]) Activity Tolerance: Patient tolerated treatment well Patient left: in chair;with call bell/phone within reach Nurse Communication: Mobility status PT Visit Diagnosis: Difficulty in walking, not elsewhere classified (R26.2);Muscle weakness (generalized) (M62.81)     Time: 1250-1310 PT Time Calculation (min) (ACUTE ONLY): 20 min  Charges:  $Therapeutic Activity: 8-22 mins                    Zadie Cleverly, PTA  Jannet Askew 01/21/2023, 1:33 PM

## 2023-01-24 ENCOUNTER — Ambulatory Visit: Payer: 59 | Admitting: Cardiovascular Disease

## 2023-01-24 LAB — BLOOD GAS, ARTERIAL
Acid-Base Excess: 15.6 mmol/L — ABNORMAL HIGH (ref 0.0–2.0)
Bicarbonate: 41.3 mmol/L — ABNORMAL HIGH (ref 20.0–28.0)
FIO2: 50 %
Mechanical Rate: 18
PEEP: 5 cmH2O
Patient temperature: 37
pCO2 arterial: 53 mmHg — ABNORMAL HIGH (ref 32–48)
pH, Arterial: 7.5 — ABNORMAL HIGH (ref 7.35–7.45)
pO2, Arterial: 70 mmHg — ABNORMAL LOW (ref 83–108)

## 2023-06-12 ENCOUNTER — Encounter: Payer: Self-pay | Admitting: Emergency Medicine

## 2023-06-12 ENCOUNTER — Other Ambulatory Visit: Payer: Self-pay

## 2023-06-12 ENCOUNTER — Inpatient Hospital Stay
Admission: EM | Admit: 2023-06-12 | Discharge: 2023-06-19 | DRG: 291 | Disposition: A | Discharge status: Home Health | Payer: 59 | Attending: Hospitalist | Admitting: Hospitalist

## 2023-06-12 ENCOUNTER — Emergency Department: Payer: 59

## 2023-06-12 DIAGNOSIS — E874 Mixed disorder of acid-base balance: Secondary | ICD-10-CM | POA: Diagnosis present

## 2023-06-12 DIAGNOSIS — I110 Hypertensive heart disease with heart failure: Secondary | ICD-10-CM | POA: Diagnosis present

## 2023-06-12 DIAGNOSIS — E785 Hyperlipidemia, unspecified: Secondary | ICD-10-CM | POA: Diagnosis present

## 2023-06-12 DIAGNOSIS — T50996A Underdosing of other drugs, medicaments and biological substances, initial encounter: Secondary | ICD-10-CM | POA: Diagnosis present

## 2023-06-12 DIAGNOSIS — Z6841 Body Mass Index (BMI) 40.0 and over, adult: Secondary | ICD-10-CM

## 2023-06-12 DIAGNOSIS — J9622 Acute and chronic respiratory failure with hypercapnia: Secondary | ICD-10-CM | POA: Diagnosis present

## 2023-06-12 DIAGNOSIS — G9341 Metabolic encephalopathy: Secondary | ICD-10-CM | POA: Diagnosis present

## 2023-06-12 DIAGNOSIS — Z91128 Patient's intentional underdosing of medication regimen for other reason: Secondary | ICD-10-CM

## 2023-06-12 DIAGNOSIS — I2510 Atherosclerotic heart disease of native coronary artery without angina pectoris: Secondary | ICD-10-CM | POA: Diagnosis present

## 2023-06-12 DIAGNOSIS — J9620 Acute and chronic respiratory failure, unspecified whether with hypoxia or hypercapnia: Secondary | ICD-10-CM | POA: Diagnosis not present

## 2023-06-12 DIAGNOSIS — Z87891 Personal history of nicotine dependence: Secondary | ICD-10-CM

## 2023-06-12 DIAGNOSIS — Z7982 Long term (current) use of aspirin: Secondary | ICD-10-CM | POA: Diagnosis not present

## 2023-06-12 DIAGNOSIS — R0602 Shortness of breath: Secondary | ICD-10-CM | POA: Diagnosis present

## 2023-06-12 DIAGNOSIS — Z7984 Long term (current) use of oral hypoglycemic drugs: Secondary | ICD-10-CM | POA: Diagnosis not present

## 2023-06-12 DIAGNOSIS — J9621 Acute and chronic respiratory failure with hypoxia: Secondary | ICD-10-CM | POA: Diagnosis present

## 2023-06-12 DIAGNOSIS — Z79899 Other long term (current) drug therapy: Secondary | ICD-10-CM

## 2023-06-12 DIAGNOSIS — Z823 Family history of stroke: Secondary | ICD-10-CM

## 2023-06-12 DIAGNOSIS — I509 Heart failure, unspecified: Principal | ICD-10-CM

## 2023-06-12 DIAGNOSIS — R7989 Other specified abnormal findings of blood chemistry: Secondary | ICD-10-CM | POA: Diagnosis not present

## 2023-06-12 DIAGNOSIS — Z8249 Family history of ischemic heart disease and other diseases of the circulatory system: Secondary | ICD-10-CM | POA: Diagnosis not present

## 2023-06-12 DIAGNOSIS — Z794 Long term (current) use of insulin: Secondary | ICD-10-CM | POA: Diagnosis not present

## 2023-06-12 DIAGNOSIS — Z7951 Long term (current) use of inhaled steroids: Secondary | ICD-10-CM | POA: Diagnosis not present

## 2023-06-12 DIAGNOSIS — Z515 Encounter for palliative care: Secondary | ICD-10-CM | POA: Diagnosis not present

## 2023-06-12 DIAGNOSIS — I5031 Acute diastolic (congestive) heart failure: Secondary | ICD-10-CM | POA: Diagnosis present

## 2023-06-12 DIAGNOSIS — E1140 Type 2 diabetes mellitus with diabetic neuropathy, unspecified: Secondary | ICD-10-CM | POA: Diagnosis present

## 2023-06-12 DIAGNOSIS — E1165 Type 2 diabetes mellitus with hyperglycemia: Secondary | ICD-10-CM | POA: Diagnosis present

## 2023-06-12 DIAGNOSIS — J9690 Respiratory failure, unspecified, unspecified whether with hypoxia or hypercapnia: Secondary | ICD-10-CM | POA: Diagnosis present

## 2023-06-12 DIAGNOSIS — J441 Chronic obstructive pulmonary disease with (acute) exacerbation: Secondary | ICD-10-CM | POA: Diagnosis present

## 2023-06-12 DIAGNOSIS — Z23 Encounter for immunization: Secondary | ICD-10-CM

## 2023-06-12 DIAGNOSIS — E119 Type 2 diabetes mellitus without complications: Secondary | ICD-10-CM

## 2023-06-12 LAB — BLOOD GAS, VENOUS
Acid-Base Excess: 17.7 mmol/L — ABNORMAL HIGH (ref 0.0–2.0)
Bicarbonate: 51 mmol/L — ABNORMAL HIGH (ref 20.0–28.0)
O2 Saturation: 35 %
Patient temperature: 37
pCO2, Ven: 119 mm[Hg] (ref 44–60)
pH, Ven: 7.24 — ABNORMAL LOW (ref 7.25–7.43)

## 2023-06-12 LAB — COMPREHENSIVE METABOLIC PANEL
ALT: 25 U/L (ref 0–44)
AST: 21 U/L (ref 15–41)
Albumin: 3.6 g/dL (ref 3.5–5.0)
Alkaline Phosphatase: 58 U/L (ref 38–126)
Anion gap: 7 (ref 5–15)
BUN: 16 mg/dL (ref 8–23)
CO2: 43 mmol/L — ABNORMAL HIGH (ref 22–32)
Calcium: 8.6 mg/dL — ABNORMAL LOW (ref 8.9–10.3)
Chloride: 90 mmol/L — ABNORMAL LOW (ref 98–111)
Creatinine, Ser: 0.96 mg/dL (ref 0.61–1.24)
GFR, Estimated: >60 mL/min (ref >60)
Glucose, Bld: 198 mg/dL — ABNORMAL HIGH (ref 70–99)
Potassium: 4.7 mmol/L (ref 3.5–5.1)
Sodium: 140 mmol/L (ref 135–145)
Total Bilirubin: 1 mg/dL (ref 0.3–1.2)
Total Protein: 7.3 g/dL (ref 6.5–8.1)

## 2023-06-12 LAB — CBC WITH DIFFERENTIAL/PLATELET
Abs Immature Granulocytes: 0.04 10*3/uL (ref 0.00–0.07)
Basophils Absolute: 0 10*3/uL (ref 0.0–0.1)
Basophils Relative: 0 %
Eosinophils Absolute: 0.1 10*3/uL (ref 0.0–0.5)
Eosinophils Relative: 1 %
HCT: 42.7 % (ref 39.0–52.0)
Hemoglobin: 12.4 g/dL — ABNORMAL LOW (ref 13.0–17.0)
Immature Granulocytes: 1 %
Lymphocytes Relative: 11 %
Lymphs Abs: 0.9 10*3/uL (ref 0.7–4.0)
MCH: 28.4 pg (ref 26.0–34.0)
MCHC: 29 g/dL — ABNORMAL LOW (ref 30.0–36.0)
MCV: 97.7 fL (ref 80.0–100.0)
Monocytes Absolute: 0.7 10*3/uL (ref 0.1–1.0)
Monocytes Relative: 9 %
Neutro Abs: 6.5 10*3/uL (ref 1.7–7.7)
Neutrophils Relative %: 78 %
Platelets: 232 10*3/uL (ref 150–400)
RBC: 4.37 MIL/uL (ref 4.22–5.81)
RDW: 12.9 % (ref 11.5–15.5)
WBC: 8.3 10*3/uL (ref 4.0–10.5)
nRBC: 0 % (ref 0.0–0.2)

## 2023-06-12 LAB — TROPONIN I (HIGH SENSITIVITY)
Troponin I (High Sensitivity): 152 ng/L (ref <18)
Troponin I (High Sensitivity): 153 ng/L (ref <18)
Troponin I (High Sensitivity): 142 ng/L (ref <18)

## 2023-06-12 LAB — CBC
HCT: 44 % (ref 39.0–52.0)
Hemoglobin: 12.8 g/dL — ABNORMAL LOW (ref 13.0–17.0)
MCH: 28.1 pg (ref 26.0–34.0)
MCHC: 29.1 g/dL — ABNORMAL LOW (ref 30.0–36.0)
MCV: 96.7 fL (ref 80.0–100.0)
Platelets: 225 10*3/uL (ref 150–400)
RBC: 4.55 MIL/uL (ref 4.22–5.81)
RDW: 13 % (ref 11.5–15.5)
WBC: 7.2 10*3/uL (ref 4.0–10.5)
nRBC: 0 % (ref 0.0–0.2)

## 2023-06-12 LAB — GLUCOSE, CAPILLARY
Glucose-Capillary: 115 mg/dL — ABNORMAL HIGH (ref 70–99)
Glucose-Capillary: 127 mg/dL — ABNORMAL HIGH (ref 70–99)
Glucose-Capillary: 99 mg/dL (ref 70–99)

## 2023-06-12 LAB — CREATININE, SERUM
Creatinine, Ser: 0.94 mg/dL (ref 0.61–1.24)
GFR, Estimated: >60 mL/min (ref >60)

## 2023-06-12 LAB — BRAIN NATRIURETIC PEPTIDE: B Natriuretic Peptide: 126.2 pg/mL — ABNORMAL HIGH (ref 0.0–100.0)

## 2023-06-12 MED ORDER — DOCUSATE SODIUM 100 MG PO CAPS
100.0000 mg | ORAL_CAPSULE | Freq: Two times a day (BID) | ORAL | Status: DC | PRN
  Administered 2023-06-17: 100 mg via ORAL
  Filled 2023-06-12: qty 1

## 2023-06-12 MED ORDER — ONDANSETRON HCL 4 MG/2ML IJ SOLN: 4.0000 mg | Freq: Four times a day (QID) | INTRAMUSCULAR | Status: DC | PRN

## 2023-06-12 MED ORDER — ACETAZOLAMIDE SODIUM 500 MG IJ SOLR
500.0000 mg | Freq: Once | INTRAMUSCULAR | Status: AC
  Administered 2023-06-12: 500 mg via INTRAVENOUS
  Filled 2023-06-12: qty 500

## 2023-06-12 MED ORDER — INSULIN ASPART 100 UNIT/ML IJ SOLN
0.0000 [IU] | INTRAMUSCULAR | Status: DC
  Administered 2023-06-13 (×3): 2 [IU] via SUBCUTANEOUS
  Administered 2023-06-13: 5 [IU] via SUBCUTANEOUS
  Administered 2023-06-13: 2 [IU] via SUBCUTANEOUS
  Administered 2023-06-14: 3 [IU] via SUBCUTANEOUS
  Administered 2023-06-14: 2 [IU] via SUBCUTANEOUS
  Administered 2023-06-14: 3 [IU] via SUBCUTANEOUS
  Filled 2023-06-12 (×8): qty 1

## 2023-06-12 MED ORDER — ACETAMINOPHEN 325 MG PO TABS
650.0000 mg | ORAL_TABLET | ORAL | Status: DC | PRN
  Administered 2023-06-16 – 2023-06-19 (×4): 650 mg via ORAL
  Filled 2023-06-12 (×5): qty 2

## 2023-06-12 MED ORDER — NITROGLYCERIN IN D5W 200-5 MCG/ML-% IV SOLN
0.0000 ug/min | INTRAVENOUS | Status: DC
  Administered 2023-06-12: 5 ug/min via INTRAVENOUS
  Filled 2023-06-12: qty 250

## 2023-06-12 MED ORDER — FUROSEMIDE 10 MG/ML IJ SOLN
60.0000 mg | Freq: Once | INTRAMUSCULAR | Status: AC
  Administered 2023-06-12: 60 mg via INTRAVENOUS
  Filled 2023-06-12: qty 8

## 2023-06-12 MED ORDER — SODIUM CHLORIDE 0.9% FLUSH: 3.0000 mL | INTRAVENOUS | Status: DC | PRN

## 2023-06-12 MED ORDER — POLYETHYLENE GLYCOL 3350 17 G PO PACK: 17.0000 g | PACK | Freq: Every day | ORAL | Status: DC | PRN

## 2023-06-12 MED ORDER — HEPARIN SODIUM (PORCINE) 5000 UNIT/ML IJ SOLN
5000.0000 [IU] | Freq: Three times a day (TID) | INTRAMUSCULAR | Status: DC
  Administered 2023-06-12 – 2023-06-18 (×17): 5000 [IU] via SUBCUTANEOUS
  Filled 2023-06-12 (×18): qty 1

## 2023-06-12 MED ORDER — SODIUM CHLORIDE 0.9% FLUSH
3.0000 mL | Freq: Two times a day (BID) | INTRAVENOUS | Status: DC
  Administered 2023-06-12 – 2023-06-18 (×13): 3 mL via INTRAVENOUS

## 2023-06-12 MED ORDER — ASPIRIN 325 MG PO TABS: 325.0000 mg | ORAL_TABLET | Freq: Once | ORAL | Status: DC

## 2023-06-12 NOTE — ED Triage Notes (Signed)
Patient to ED via [facility] from home for CP and SOB x3 days. Patient reports not wearing his CPAP at night recently due it being broken. Increased falls and swelling in abd/ bilateral legs. Also state he has been having constipation . Wears 3L [place] at baseline- initially 85% with EMS on the 3L. Given 4 baby aspirin and 1 spray SL nitroglycerin with EMS.   166/95 100 HR  266 cbg

## 2023-06-12 NOTE — Progress Notes (Signed)
Patient was complaining of chest pain. Patient currently requiring continuous bipap. Notified NP [place], orders given to initiate nitroglycerin drip.

## 2023-06-12 NOTE — ED Notes (Addendum)
Respiratory called for transport. Gerilyn Pilgrim RN to transport.

## 2023-06-12 NOTE — ED Notes (Signed)
Patient's bed noted to be wet. Patient cleaned and bed changed at this time. New male purewick replaced.

## 2023-06-12 NOTE — ED Notes (Signed)
Called RT for Bipap per EDP Dr. Rosalia Hammers

## 2023-06-12 NOTE — Progress Notes (Signed)
eLink Physician-Brief Progress Note Patient Name: Elaine Middleton DOB: 1956/03/18 MRN: [id number]   Date of Service  06/12/2023  HPI/Events of Note  67 year old male with history of CHF, COPD, CAD, HTN, T2DM, OSA presenting to the Emergency Department for evaluation of chest pain and shortness of breath found to have severe COPD exacerbation and acute heart failure.  On examination he is hypertensive, hypoxic to 85% on 40% FiO2, and receiving nitroglycerin at 15 mcg/min.  Results show hypercapnia to 119, pH 7.24 and metabolic panel with chronic metabolic alkalosis and hyperglycemia.  BNP and troponin mildly elevated.  Chest radiograph with pulmonary vascular markings  eICU Interventions  Status post Lasix x 1, consider repeat.  Track ins and outs.  Comfortable on BiPAP, would maintain throughout the evening, consider increasing rate for increased minute ventilation   Maintain nitroglycerin drip  DVT prophylaxis with heparin  GI prophylaxis not indicated        Mickel Schreur 06/12/2023, 8:36 PM

## 2023-06-12 NOTE — Progress Notes (Signed)
Report attempted x 1

## 2023-06-12 NOTE — ED Provider Notes (Signed)
[facility] Provider Note    Event Date/Time   First MD Initiated Contact with Patient 06/12/23 1120     (approximate)   History   Chest Pain   HPI  Cody Alexander is a 67 year old male with history of CHF, COPD, CAD, HTN, T2DM, OSA presenting to the Emergency Department for evaluation of chest pain and shortness of breath.  Patient reports that for the past 3 days he has had worsening chest pain and shortness of breath as well as lower extremity edema.  States he recently started taking Ozempic and that has caused some GI upset so he has not been taking his medications regularly.  Specifically has not been adherent with his diuresis.  Additionally reports that his CPAP mask is broken so he has not been wearing that at night.  Normally wears 3 to 4 L of oxygen.  With EMS was found to be 85% on 3 L.  Received 4 baby aspirin and 1 spray of nitroglycerin.      Physical Exam   Triage Vital Signs: ED Triage Vitals  Encounter Vitals Group     BP 06/12/23 1121 (!) 150/95     Systolic BP Percentile --      Diastolic BP Percentile --      Pulse Rate 06/12/23 1121 92     Resp 06/12/23 1121 (!) 22     Temp 06/12/23 1121 97.8 F (36.6 C)     Temp Source 06/12/23 1121 Oral     SpO2 06/12/23 1121 92 %     Weight 06/12/23 1119 (!) 310 lb (140.6 kg)     Height 06/12/23 1119 6' (1.829 m)     Head Circumference --      Peak Flow --      Pain Score 06/12/23 1118 7     Pain Loc --      Pain Education --      Exclude from Growth Chart --     Most recent vital signs: Vitals:   06/12/23 1500 06/12/23 1530  BP: 116/83 (!) 153/91  Pulse: 86 88  Resp:  20  Temp:    SpO2: 94% 99%     General: Awake, interactive  CV:  Regular rate, bilateral lower extremity edema present Resp:  Lung sounds diminished bilaterally most notable at the bases Abd:  Soft, nondistended. Neuro:  Symmetric facial movement, fluid speech   ED Results / Procedures / Treatments    Labs (all labs ordered are listed, but only abnormal results are displayed) Labs Reviewed  CBC WITH DIFFERENTIAL/PLATELET - Abnormal; Notable for the following components:      Result Value   Hemoglobin 12.4 (*)    MCHC 29.0 (*)    All other components within normal limits  COMPREHENSIVE METABOLIC PANEL - Abnormal; Notable for the following components:   Chloride 90 (*)    CO2 43 (*)    Glucose, Bld 198 (*)    Calcium 8.6 (*)    All other components within normal limits  BRAIN NATRIURETIC PEPTIDE - Abnormal; Notable for the following components:   B Natriuretic Peptide 126.2 (*)    All other components within normal limits  BLOOD GAS, VENOUS - Abnormal; Notable for the following components:   pCO2, Ven 112 (*)    pO2, Ven <31 (*)    Bicarbonate 53.9 (*)    Acid-Base Excess 21.1 (*)    All other components within normal limits  BLOOD GAS, VENOUS - Abnormal; Notable for  the following components:   pH, Ven 7.24 (*)    pCO2, Ven 119 (*)    Bicarbonate 51.0 (*)    Acid-Base Excess 17.7 (*)    All other components within normal limits  TROPONIN I (HIGH SENSITIVITY) - Abnormal; Notable for the following components:   Troponin I (High Sensitivity) 152 (*)    All other components within normal limits  TROPONIN I (HIGH SENSITIVITY) - Abnormal; Notable for the following components:   Troponin I (High Sensitivity) 153 (*)    All other components within normal limits     EKG EKG independently reviewed interpreted by myself (ER attending) demonstrates:  EKG demonstrate sinus rhythm at a rate of 90, PR 209, QRS 89, QTc 469, no acute ST changes  RADIOLOGY Imaging independently reviewed and interpreted by myself demonstrates:  CXR with cardiomegaly without overt edema  PROCEDURES:  Critical Care performed: Yes, see critical care procedure note(s)  CRITICAL CARE Performed by: Trinna Post   Total critical care time: 45 minutes  Critical care time was exclusive of separately  billable procedures and treating other patients.  Critical care was necessary to treat or prevent imminent or life-threatening deterioration.  Critical care was time spent personally by me on the following activities: development of treatment plan with patient and/or surrogate as well as nursing, discussions with consultants, evaluation of patient's response to treatment, examination of patient, obtaining history from patient or surrogate, ordering and performing treatments and interventions, ordering and review of laboratory studies, ordering and review of radiographic studies, pulse oximetry and re-evaluation of patient's condition.   Procedures   MEDICATIONS ORDERED IN ED: Medications  furosemide (LASIX) injection 60 mg (60 mg Intravenous Given 06/12/23 1413)     IMPRESSION / MDM / ASSESSMENT AND PLAN / ED COURSE  I reviewed the triage vital signs and the nursing notes.  Differential diagnosis includes, but is not limited to, CHF exacerbation, CO2 retention secondary to OSA, ACS, pneumonia, pneumothorax  Patient's presentation is most consistent with acute presentation with potential threat to life or bodily function.  67 year old male presenting to the emergency department for evaluation of shortness of breath.  Satting in the mid 80s on 4 L at the time of my initial evaluation, increased to 5 L with improvement to the low 90s.  Did have somewhat labored respirations, so VBG was obtained demonstrating significant CO2 retention at 112 with a pH of 7.29.  Patient was placed on BiPAP.  Troponin elevated at 152, repeat 153, suspect demand in the setting of his fluid overload.  Received aspirin, do not think there is an indication for heparin currently.  BNP slightly elevated, but potentially unreliable due to patient's BMI.  Ordered for 80 of IV Lasix.  After a few hours, repeat VBG was obtained which unfortunately demonstrated worsening pH of 7.24 and increasing pCO2 of 119.  With this, I did  discuss possibility of intubation with the patient.  He tells me that his breathing does not significantly changed, but he thinks that he could improve if he is kept on the BiPAP and would like to hold off on intubation currently, but would like to remain a full code with intubation if needed to save his life.  With his worsening blood gas, did feel he was most appropriately monitored in the ICU.  Case was reviewed with Dr. Francesco Sor who will evaluate the patient.  Clinical Course as of 06/12/23 1601  Thu Jun 12, 2023  1523 Likely admit to ICU. Does not want  to be intubated currently - trial of Bipap, but is full code.  ICU to see.  Worsening gas on bipap. CHF exacerbation [SM]    Clinical Course User Index [SM] Corena Herter, MD     FINAL CLINICAL IMPRESSION(S) / ED DIAGNOSES   Final diagnoses:  Acute on chronic congestive heart failure, unspecified heart failure type (HCC)  Elevated troponin     Rx / DC Orders   ED Discharge Orders     None        Note:  This document was prepared using Dragon voice recognition software and may include unintentional dictation errors.   Trinna Post, MD 06/12/23 [phone]

## 2023-06-12 NOTE — H&P (Signed)
NAME:  Armistead Hunting, MRN:  [id number], DOB:  [birth year], LOS: 0 ADMISSION DATE:  06/12/2023  CHIEF COMPLAINT:  RESP DISTRESS   History of Present Illness:  67 year old male with history of CHF, COPD, CAD, HTN, T2DM, OSA presenting to the Emergency Department for evaluation of chest pain and shortness of breath.    Patient reports that for the past 3 days he has had worsening chest pain and shortness of breath as well as lower extremity edema.    States he recently started taking Ozempic and that has caused some GI upset so he has not been taking his medications regularly.   Specifically has not been adherent with his diuresis.   reports that CPAP mask is broken so he has not been wearing that at night.   Normally wears 3 to 4 L of oxygen.  With EMS was found to be 85% on 3 L.  Received 4 baby aspirin and 1 spray of nitroglycerin   Placed on biPAP Severe resp acidosis despite 2 hrs of biPAP   Significant Hospital Events: Including procedures, antibiotic start and stop dates in addition to other pertinent events    10/17 admitted for severe resp failure    Antimicrobials:   Antibiotics Given (last 72 hours)     None         Objective   Blood pressure 100/86, pulse 76, temperature 97.8 F (36.6 C), temperature source Oral, resp. rate 20, height 6' (1.829 m), weight (!) 140.6 kg, SpO2 100%.    FiO2 (%):  [40 %] 40 %  No intake or output data in the 24 hours ending 06/12/23 1651 Filed Weights   06/12/23 1119  Weight: (!) 140.6 kg     REVIEW OF SYSTEMS  PATIENT IS UNABLE TO PROVIDE COMPLETE REVIEW OF SYSTEMS DUE TO SEVERE CRITICAL ILLNESS   PHYSICAL EXAMINATION:  GENERAL:critically ill appearing, +resp distress EYES: Pupils equal, round, reactive to light.  No scleral icterus.  MOUTH: Moist mucosal membrane NECK: Supple.  PULMONARY: Lungs clear to auscultation, +crackles  CARDIOVASCULAR: S1 and S2.  Regular rate and rhythm GASTROINTESTINAL: Soft,  nontender, -distended. Positive bowel sounds.  MUSCULOSKELETAL: + swelling, +edema +erythema NEUROLOGIC: lethargic but arousable SKIN:normal, warm to touch, Capillary refill delayed     Labs/imaging that I havepersonally reviewed  (right click and "Reselect all SmartList Selections" daily)      ASSESSMENT AND PLAN SYNOPSIS  67 yo morbidly obese white male with acute diastolic heart failure with COPD exacerbation end stage diseases   Severe ACUTE Hypoxic and Hypercapnic Respiratory Failure High risk for intubation, high risk for cardiac arrest  FiO2 (%):  [40 %] 40 %   SEVERE COPD EXACERBATION -continue NEB THERAPY as prescribed -morphine as needed   CARDIAC FAILURE-acute diastolic dysfunction -oxygen as needed -Lasix as tolerated -follow up cardiac enzymes as indicated   CARDIAC ICU monitoring   RENAL -continue Foley Catheter-assess need -Avoid nephrotoxic agents -Follow urine output, BMP -Ensure adequate renal perfusion, optimize oxygenation -Renal dose medications  No intake or output data in the 24 hours ending 06/12/23 1651    Latest Ref Rng & Units 06/12/2023   11:22 AM 01/21/2023    4:02 AM 01/19/2023    4:07 AM  BMP  Glucose 70 - 99 mg/dL 782  956  213   BUN 8 - 23 mg/dL 16  32  29   Creatinine 0.61 - 1.24 mg/dL 0.86  5.78  4.69   Sodium 135 - 145 mmol/L 140  137  139   Potassium 3.5 - 5.1 mmol/L 4.7  3.8  3.4   Chloride 98 - 111 mmol/L 90  97  100   CO2 22 - 32 mmol/L 43  32  31   Calcium 8.9 - 10.3 mg/dL 8.6  8.6  8.5      NEUROLOGY Acute  metabolic encephalopathy Due to acidosis   ENDO - ICU hypoglycemic\Hyperglycemia protocol -check FSBS per protocol   GI GI PROPHYLAXIS as indicated  NUTRITIONAL STATUS DIET-->NPO Constipation protocol as indicated   ELECTROLYTES -follow labs as needed -replace as needed -pharmacy consultation and following   ACUTE ANEMIA- TRANSFUSE AS NEEDED CONSIDER TRANSFUSION  IF  HGB<7     Best practice (right click and "Reselect all SmartList Selections" daily)  Diet: NPO DVT prophylaxis: Subcutaneous Heparin Mobility:  bed rest  Code Status:  FULL Disposition:ICU  Labs   CBC: Recent Labs  Lab 06/12/23 1122  WBC 8.3  NEUTROABS 6.5  HGB 12.4*  HCT 42.7  MCV 97.7  PLT 232    Basic Metabolic Panel: Recent Labs  Lab 06/12/23 1122  NA 140  K 4.7  CL 90*  CO2 43*  GLUCOSE 198*  BUN 16  CREATININE 0.96  CALCIUM 8.6*   GFR: Estimated Creatinine Clearance: 110.1 mL/min (by C-G formula based on SCr of 0.96 mg/dL). Recent Labs  Lab 06/12/23 1122  WBC 8.3    Liver Function Tests: Recent Labs  Lab 06/12/23 1122  AST 21  ALT 25  ALKPHOS 58  BILITOT 1.0  PROT 7.3  ALBUMIN 3.6   No results for input(s): "LIPASE", "AMYLASE" in the last 168 hours. No results for input(s): "AMMONIA" in the last 168 hours.  ABG    Component Value Date/Time   PHART 7.44 01/09/2023 1517   PCO2ART 54 (H) 01/09/2023 1517   PO2ART 62 (L) 01/09/2023 1517   HCO3 51.0 (H) 06/12/2023 1426   O2SAT 35 06/12/2023 1426     Coagulation Profile: No results for input(s): "INR", "PROTIME" in the last 168 hours.  Cardiac Enzymes: No results for input(s): "CKTOTAL", "CKMB", "CKMBINDEX", "TROPONINI" in the last 168 hours.  HbA1C: Hemoglobin A1C  Date/Time Value Ref Range Status  08/12/2013 04:01 AM 6.9 (H) 4.2 - 6.3 % Final    Comment:    The American Diabetes Association recommends that a primary goal of therapy should be <7% and that physicians should reevaluate the treatment regimen in patients with HbA1c values consistently >8%.    Hgb A1c MFr Bld  Date/Time Value Ref Range Status  01/08/2023 02:57 PM 9.4 (H) 4.8 - 5.6 % Final    Comment:    (NOTE) Pre diabetes:          5.7%-6.4%  Diabetes:              >6.4%  Glycemic control for   <7.0% adults with diabetes   11/10/2022 12:36 AM 11.2 (H) 4.8 - 5.6 % Final    Comment:    (NOTE)          Prediabetes: 5.7 - 6.4         Diabetes: >6.4         Glycemic control for adults with diabetes: <7.0     CBG: No results for input(s): "GLUCAP" in the last 168 hours.   Past Medical History:  He,  has a past medical history of CHF (congestive heart failure) (HCC), COPD (chronic obstructive pulmonary disease) (HCC), Diabetes mellitus without complication (HCC), Hypertension, and Neuropathy.   Surgical  History:   Past Surgical History:  Procedure Laterality Date   LEFT HEART CATH AND CORONARY ANGIOGRAPHY Right 02/27/2018   Procedure: LEFT HEART CATH AND CORONARY ANGIOGRAPHY;  Surgeon: Laurier Nancy, MD;  Location: [facility] INVASIVE CV LAB;  Service: Cardiovascular;  Laterality: Right;     Social History:   reports that he has quit smoking. His smoking use included cigarettes. He has never used smokeless tobacco. He reports that he does not drink alcohol and does not use drugs.   Family History:  His family history includes Hypertension in his father; Stroke in his mother.   Allergies Allergies  Allergen Reactions   Erythromycin Anaphylaxis and Swelling    Had eye swelling with erythromycin during a time when he had perf ear drum.  Tolerated azithromycin.     Home Medications  Prior to Admission medications   Medication Sig Start Date End Date Taking? Authorizing Provider  albuterol (PROVENTIL) (2.5 MG/3ML) 0.083% nebulizer solution Take 2.5 mg by nebulization every 4 (four) hours as needed. 08/01/22 08/01/23  [provider]  ASPIRIN EC 81 MG EC tablet Take 81 mg by mouth daily. 11/15/22   [provider]  atorvastatin (LIPITOR) 40 MG tablet Take 40 mg by mouth daily.    [provider]  budesonide-formoterol (SYMBICORT) 160-4.5 MCG/ACT inhaler Inhale 2 puffs into the lungs 2 (two) times daily.    [provider]  bumetanide (BUMEX) 1 MG tablet Take 3 mg by mouth daily. 11/21/22   [provider]  diclofenac Sodium (VOLTAREN) 1 % GEL  Apply 2 g topically in the morning and at bedtime. 08/01/22   [provider]  docusate sodium (COLACE) 100 MG capsule Take 1 capsule (100 mg total) by mouth 2 (two) times daily. 11/15/22   Baron Hamper, MD  hydrOXYzine (ATARAX) 10 MG tablet Take 1 tablet (10 mg total) by mouth 3 (three) times daily as needed. Home med. 06/25/22   [name], MD  insulin lispro (HUMALOG) 100 UNIT/ML KwikPen Inject 10 Units into the skin 3 (three) times daily with meals.    [provider]  ipratropium-albuterol (DUONEB) 0.5-2.5 (3) MG/3ML SOLN Take 3 mLs by nebulization every 6 (six) hours as needed. 06/25/22   [name], MD  isosorbide dinitrate (ISORDIL) 30 MG tablet Take 30 mg by mouth every morning. 11/14/20   [provider]  LANTUS SOLOSTAR 100 UNIT/ML Solostar Pen Inject 20 Units into the skin daily. 01/21/23   Wouk, Wilfred Curtis, MD  magnesium oxide (MAG-OX) 400 MG tablet Take 1 tablet by mouth 2 (two) times daily. 10/24/22   [provider]  metFORMIN (GLUCOPHAGE) 1000 MG tablet Take 1,000 mg by mouth daily. 06/02/20   [provider]  metoprolol tartrate (LOPRESSOR) 25 MG tablet Take 25 mg by mouth daily.    [provider]  PROAIR HFA 108 [phone] Base) MCG/ACT inhaler Inhale 2 puffs into the lungs every 4 (four) hours as needed for wheezing. 08/18/21   Alford Highland, MD  Selenium Sulfide 2.25 % SHAM SMARTSIG:Liberally Topical Daily Patient not taking: Reported on 01/05/2023 10/28/22   [provider]  triamcinolone cream (KENALOG) 0.1 % Apply 1 Application topically 2 (two) times daily. 10/25/22   [provider]      Critical Care Time devoted to patient care services described in this note is 65 minutes.   Critical care was necessary to treat /prevent imminent and life-threatening deterioration.   Lucie Leather, M.D.  [facility]  Medical Director [facility] [place] Medical Director [facility]  Cardio-Pulmonary Department

## 2023-06-12 NOTE — Progress Notes (Signed)
ED notified that bed is clean

## 2023-06-13 DIAGNOSIS — J9622 Acute and chronic respiratory failure with hypercapnia: Secondary | ICD-10-CM

## 2023-06-13 DIAGNOSIS — Z515 Encounter for palliative care: Secondary | ICD-10-CM | POA: Diagnosis not present

## 2023-06-13 DIAGNOSIS — J9621 Acute and chronic respiratory failure with hypoxia: Secondary | ICD-10-CM | POA: Diagnosis not present

## 2023-06-13 DIAGNOSIS — J9620 Acute and chronic respiratory failure, unspecified whether with hypoxia or hypercapnia: Secondary | ICD-10-CM | POA: Diagnosis not present

## 2023-06-13 DIAGNOSIS — I509 Heart failure, unspecified: Secondary | ICD-10-CM | POA: Diagnosis not present

## 2023-06-13 LAB — BLOOD GAS, VENOUS
Acid-Base Excess: 21.1 mmol/L — ABNORMAL HIGH (ref 0.0–2.0)
Acid-Base Excess: 25.7 mmol/L — ABNORMAL HIGH (ref 0.0–2.0)
Acid-Base Excess: 18.1 mmol/L — ABNORMAL HIGH (ref 0.0–2.0)
Bicarbonate: 53.9 mmol/L — ABNORMAL HIGH (ref 20.0–28.0)
Bicarbonate: 57.6 mmol/L — ABNORMAL HIGH (ref 20.0–28.0)
Bicarbonate: 47.1 mmol/L — ABNORMAL HIGH (ref 20.0–28.0)
O2 Saturation: 31.4 %
O2 Saturation: 41.3 %
O2 Saturation: 92.7 %
Patient temperature: 37
Patient temperature: 37
Patient temperature: 37
pCO2, Ven: 112 mm[Hg] (ref 44–60)
pCO2, Ven: 102 mm[Hg] (ref 44–60)
pCO2, Ven: 76 mm[Hg] (ref 44–60)
pH, Ven: 7.29 (ref 7.25–7.43)
pH, Ven: 7.36 (ref 7.25–7.43)
pH, Ven: 7.4 (ref 7.25–7.43)
pO2, Ven: 63 mm[Hg] — ABNORMAL HIGH (ref 32–45)

## 2023-06-13 LAB — CBC
HCT: 40 % (ref 39.0–52.0)
Hemoglobin: 12.1 g/dL — ABNORMAL LOW (ref 13.0–17.0)
MCH: 28.7 pg (ref 26.0–34.0)
MCHC: 30.3 g/dL (ref 30.0–36.0)
MCV: 94.8 fL (ref 80.0–100.0)
Platelets: 210 10*3/uL (ref 150–400)
RBC: 4.22 MIL/uL (ref 4.22–5.81)
RDW: 13 % (ref 11.5–15.5)
WBC: 7.1 10*3/uL (ref 4.0–10.5)
nRBC: 0 % (ref 0.0–0.2)

## 2023-06-13 LAB — GLUCOSE, CAPILLARY
Glucose-Capillary: 133 mg/dL — ABNORMAL HIGH (ref 70–99)
Glucose-Capillary: 132 mg/dL — ABNORMAL HIGH (ref 70–99)
Glucose-Capillary: 198 mg/dL — ABNORMAL HIGH (ref 70–99)
Glucose-Capillary: 164 mg/dL — ABNORMAL HIGH (ref 70–99)
Glucose-Capillary: 126 mg/dL — ABNORMAL HIGH (ref 70–99)
Glucose-Capillary: 204 mg/dL — ABNORMAL HIGH (ref 70–99)

## 2023-06-13 LAB — BASIC METABOLIC PANEL
Anion gap: 12 (ref 5–15)
BUN: 16 mg/dL (ref 8–23)
CO2: 36 mmol/L — ABNORMAL HIGH (ref 22–32)
Calcium: 8.8 mg/dL — ABNORMAL LOW (ref 8.9–10.3)
Chloride: 91 mmol/L — ABNORMAL LOW (ref 98–111)
Creatinine, Ser: 0.9 mg/dL (ref 0.61–1.24)
GFR, Estimated: >60 mL/min (ref >60)
Glucose, Bld: 147 mg/dL — ABNORMAL HIGH (ref 70–99)
Potassium: 3.5 mmol/L (ref 3.5–5.1)
Sodium: 139 mmol/L (ref 135–145)

## 2023-06-13 LAB — MAGNESIUM: Magnesium: 2.2 mg/dL (ref 1.7–2.4)

## 2023-06-13 LAB — PHOSPHORUS: Phosphorus: 3.1 mg/dL (ref 2.5–4.6)

## 2023-06-13 MED ORDER — ORAL CARE MOUTH RINSE
15.0000 mL | OROMUCOSAL | Status: DC
  Administered 2023-06-13 – 2023-06-18 (×12): 15 mL via OROMUCOSAL

## 2023-06-13 MED ORDER — OXYCODONE-ACETAMINOPHEN 5-325 MG PO TABS
1.0000 | ORAL_TABLET | ORAL | Status: DC | PRN
  Administered 2023-06-13 – 2023-06-17 (×13): 2 via ORAL
  Filled 2023-06-13 (×14): qty 2

## 2023-06-13 MED ORDER — FUROSEMIDE 10 MG/ML IJ SOLN
40.0000 mg | Freq: Every day | INTRAMUSCULAR | Status: DC
  Administered 2023-06-13 – 2023-06-14 (×2): 40 mg via INTRAVENOUS
  Filled 2023-06-13 (×2): qty 4

## 2023-06-13 MED ORDER — CHLORHEXIDINE GLUCONATE CLOTH 2 % EX PADS: 6.0000 | MEDICATED_PAD | Freq: Every day | CUTANEOUS | Status: DC

## 2023-06-13 MED ORDER — NITROGLYCERIN 0.4 MG SL SUBL: 0.4000 mg | SUBLINGUAL_TABLET | SUBLINGUAL | Status: DC | PRN

## 2023-06-13 MED ORDER — ORAL CARE MOUTH RINSE: 15.0000 mL | OROMUCOSAL | Status: DC | PRN

## 2023-06-13 MED ORDER — ASPIRIN 81 MG PO TBEC
81.0000 mg | DELAYED_RELEASE_TABLET | Freq: Every day | ORAL | Status: DC
  Administered 2023-06-13 – 2023-06-19 (×7): 81 mg via ORAL
  Filled 2023-06-13 (×7): qty 1

## 2023-06-13 MED ORDER — ATORVASTATIN CALCIUM 20 MG PO TABS
40.0000 mg | ORAL_TABLET | Freq: Every day | ORAL | Status: DC
  Administered 2023-06-13 – 2023-06-19 (×7): 40 mg via ORAL
  Filled 2023-06-13 (×7): qty 2

## 2023-06-13 MED ORDER — IPRATROPIUM-ALBUTEROL 0.5-2.5 (3) MG/3ML IN SOLN: 3.0000 mL | Freq: Four times a day (QID) | RESPIRATORY_TRACT | Status: DC | PRN

## 2023-06-13 NOTE — Plan of Care (Signed)
  Problem: Education: Goal: Understanding of post-operative needs will improve Outcome: Not Progressing   Problem: Respiratory: Goal: Will regain and/or maintain adequate ventilation Outcome: Not Progressing   Problem: Education: Goal: Knowledge of General Education information will improve Description: Including pain rating scale, medication(s)/side effects and non-pharmacologic comfort measures Outcome: Not Progressing

## 2023-06-13 NOTE — Progress Notes (Signed)
   06/13/23 1130  Spiritual Encounters  Type of Visit Initial  Care provided to: Patient  Referral source Nurse (RN/NT/LPN)  Reason for visit Routine spiritual support  OnCall Visit Yes  Spiritual Framework  Presenting Themes Values and beliefs;Meaning/purpose/sources of inspiration;Community and relationships  Patient Stress Factors Family relationships;Health changes  Interventions  Spiritual Care Interventions Made Prayer;Explored values/beliefs/practices/strengths;Narrative/life review;Reflective listening;Compassionate presence  Intervention Outcomes  Outcomes Connection to spiritual care;Awareness of support   CH received page from RN saying pt. requests prayer.  When [facility] arrived, pt. lying in bed awake.  Pt. shared that he has been admitted for cardiac issues and that he has been dealing with similar issues for some time.  He says he lives with a 93yo woman who is the mother of one of his friends; this living situation seems to have initially been mutually beneficial but pt. says that recently some of housemate's children have moved in and regularly abuse drugs in the home.  Pt. says he would like to find an alternative living situation if possible; pt. also acknowledges that he has not been taking care of himself as well as he should be.  He says he is a [place] and that his faith is a major source of support for him.  He expressed sadness at being estranged from his daughter and granddaughters, sharing that his daughter has distanced herself from him in the last five years.  CH provided prayer for healing and strength for pt.  Pt. welcomes follow-up support from spiritual care if able.

## 2023-06-13 NOTE — Progress Notes (Signed)
Patient is confused. Patent is having auditory hallucinations. Patient has removed purewick. Patient has been instructed to leave in place.

## 2023-06-13 NOTE — Plan of Care (Signed)
CHL Tonsillectomy/Adenoidectomy, Postoperative PEDS care plan entered in error.

## 2023-06-13 NOTE — Progress Notes (Addendum)
PROGRESS NOTE    Cody Alexander  WUJ:[id number] DOB: [birth year] DOA: 06/12/2023 PCP: [facility]   Assessment & Plan:   Principal Problem:   Respiratory failure (HCC)  Assessment and Plan:  Acute diastolic CHF: continue on IV lasix. Monitor I/Os.  COPD exacerbation: bronchodilators, encourage incentive spirometry, continue on supplemental oxygen and wean as tolerated.   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 Continue on supplemental oxygen and wean as tolerated. Uses 4-5L [place] chronically at home   DM2: likely poorly controlled. Continue on SSI w/ accuchecks  Morbid obesity: BMI 41.9. Complicates overall care & prognosis   HLD: continue on statin     DVT prophylaxis: heparin  Code Status: full  Family Communication:  Disposition Plan: depends on PT/OT recs   Level of care: Stepdown Status is: Inpatient Remains inpatient appropriate because: severity of illness    Consultants:    Procedures:   Antimicrobials:    Subjective: Pt c/o shortness of breath   Objective: Vitals:   06/13/23 0300 06/13/23 0337 06/13/23 0400 06/13/23 0500  BP: 108/67  110/70 113/62  Pulse: 77  83 80  Resp: 16   18  Temp:      TempSrc:      SpO2: 99%  97% 96%  Weight:  (!) 140.2 kg    Height:        Intake/Output Summary (Last 24 hours) at 06/13/2023 0856 Last data filed at 06/13/2023 0546 Gross per 24 hour  Intake 47.63 ml  Output 1550 ml  Net -1502.37 ml   Filed Weights   06/12/23 1119 06/12/23 1842 06/13/23 0337  Weight: (!) 140.6 kg (!) 141.6 kg (!) 140.2 kg    Examination:  General exam: Appears calm and comfortable. Morbid obesity  Respiratory system: severely diminished breath sounds b/l  Cardiovascular system: S1 & S2+. No rubs, gallops or clicks Gastrointestinal system: Abdomen is obese, soft and nontender. Normal bowel sounds heard. Central nervous system: Alert  and oriented. Moves all extremities  Psychiatry: Judgement and insight appear normal. Mood & affect appropriate.     Data Reviewed: I have personally reviewed following labs and imaging studies  CBC: Recent Labs  Lab 06/12/23 1122 06/12/23 1758 06/13/23 0434  WBC 8.3 7.2 7.1  NEUTROABS 6.5  --   --   HGB 12.4* 12.8* 12.1*  HCT 42.7 44.0 40.0  MCV 97.7 96.7 94.8  PLT 232 225 210   Basic Metabolic Panel: Recent Labs  Lab 06/12/23 1122 06/12/23 1758 06/13/23 0434  NA 140  --  139  K 4.7  --  3.5  CL 90*  --  91*  CO2 43*  --  36*  GLUCOSE 198*  --  147*  BUN 16  --  16  CREATININE 0.96 0.94 0.90  CALCIUM 8.6*  --  8.8*  MG  --   --  2.2  PHOS  --   --  3.1   GFR: Estimated Creatinine Clearance: 117.2 mL/min (by C-G formula based on SCr of 0.9 mg/dL). Liver Function Tests: Recent Labs  Lab 06/12/23 1122  AST 21  ALT 25  ALKPHOS 58  BILITOT 1.0  PROT 7.3  ALBUMIN 3.6   No results for input(s): "LIPASE", "AMYLASE" in the last 168 hours. No results for input(s): "AMMONIA" in the last 168 hours. Coagulation Profile: No results for input(s): "INR", "PROTIME" in the last 168 hours. Cardiac Enzymes: No results for input(s): "CKTOTAL", "CKMB", "CKMBINDEX", "  TROPONINI" in the last 168 hours. BNP (last 3 results) No results for input(s): "PROBNP" in the last 8760 hours. HbA1C: No results for input(s): "HGBA1C" in the last 72 hours. CBG: Recent Labs  Lab 06/12/23 1846 06/12/23 2011 06/12/23 2331 06/13/23 0331  GLUCAP 127* 115* 99 133*   Lipid Profile: No results for input(s): "CHOL", "HDL", "LDLCALC", "TRIG", "CHOLHDL", "LDLDIRECT" in the last 72 hours. Thyroid Function Tests: No results for input(s): "TSH", "T4TOTAL", "FREET4", "T3FREE", "THYROIDAB" in the last 72 hours. Anemia Panel: No results for input(s): "VITAMINB12", "FOLATE", "FERRITIN", "TIBC", "IRON", "RETICCTPCT" in the last 72 hours. Sepsis Labs: No results for input(s): "PROCALCITON",  "LATICACIDVEN" in the last 168 hours.  No results found for this or any previous visit (from the past 240 hour(s)).       Radiology Studies: DG Chest Port 1 View  Result Date: 06/12/2023 CLINICAL DATA:  shortness of breath EXAM: PORTABLE CHEST 1 VIEW COMPARISON:  May 2024 FINDINGS: The heart appears enlarged. No pleural effusion. No pneumothorax. No lobar consolidation. Query pulmonary vascular congestion. No acute osseous abnormality. IMPRESSION: Cardiomegaly with pulmonary vascular congestion. No signs of overt edema or effusion. Electronically Signed   By: Olive Bass M.D.   On: 06/12/2023 14:20        Scheduled Meds:  Chlorhexidine Gluconate Cloth  6 each Topical Q0600   heparin  5,000 Units Subcutaneous Q8H   insulin aspart  0-15 Units Subcutaneous Q4H   sodium chloride flush  3 mL Intravenous Q12H   Continuous Infusions:   LOS: 1 day       Charise Killian, MD [facility] Pager [phone]  If 7PM-7AM, please contact night-coverage [email or web address] 06/13/2023, 8:56 AM

## 2023-06-13 NOTE — TOC Initial Note (Addendum)
Transition of Care [facility]) - Initial/Assessment Note    Patient Details  Name: Cody Alexander MRN: [id number] Date of Birth: 1955-09-27  Transition of Care [facility] CM/SW Contact:    Margarito Liner, LCSW Phone Number: 06/13/2023, 11:23 AM  Clinical Narrative:   CSW met with patient. No supports at bedside. CSW introduced role and inquired about his home bipap mask being broken. Patient stated he gets his bipap supplies from [facility] and he has obtained a new mask. Patient lives with his roommate and her family. He wants to look for another place to live. CSW explained that we cannot assist with this but can provide resources. Resources added to AVS. Patient requested chaplain consult. RN is aware. No further concerns. CSW encouraged patient to contact CSW as needed. CSW will continue to follow patient for support and facilitate return home once stable.               5:04 pm: Per [facility], patient is active with nursing only.  Expected Discharge Plan: Home/Self Care Barriers to Discharge: Continued Medical Work up   Patient Goals and CMS Choice            Expected Discharge Plan and Services     Post Acute Care Choice: NA Living arrangements for the past 2 months: Single Family Home                                      Prior Living Arrangements/Services Living arrangements for the past 2 months: Single Family Home Lives with:: Roommate Patient language and need for interpreter reviewed:: Yes Do you feel safe going back to the place where you live?: Yes      Need for Family Participation in Patient Care: Yes (Comment) Care giver support system in place?: Yes (comment) Current home services: DME Criminal Activity/Legal Involvement Pertinent to Current Situation/Hospitalization: No - Comment as needed  Activities of Daily Living      Permission Sought/Granted                  Emotional Assessment Appearance:: Appears stated  age Attitude/Demeanor/Rapport: Engaged, Gracious Affect (typically observed): Accepting, Appropriate, Calm, Pleasant Orientation: : Oriented to Self, Oriented to Place, Oriented to  Time, Oriented to Situation Alcohol / Substance Use: Not Applicable Psych Involvement: No (comment)  Admission diagnosis:  Respiratory failure (HCC) [J96.90] Elevated troponin [R79.89] Acute on chronic congestive heart failure, unspecified heart failure type (HCC) [I50.9] Patient Active Problem List   Diagnosis Date Noted   Respiratory failure (HCC) 06/12/2023   Goals of care, counseling/discussion 01/16/2023   History of kidney stones 01/06/2023   Hypokalemia 01/05/2023   Bilateral leg pain 11/10/2022   Chest pain    Syncope and collapse    Chronic back pain    Acute on chronic respiratory failure (HCC) 03/14/2022   Diabetes mellitus without complication (HCC)    Acute exacerbation of chronic low back pain    Urinary tract infection    HLD (hyperlipidemia) 01/27/2022   CAD (coronary artery disease) 01/27/2022   Fall at home, initial encounter 01/27/2022   Leg weakness, bilateral 01/27/2022   Hyperkalemia    Acute CHF (congestive heart failure) (HCC) 08/15/2021   Benign prostatic hyperplasia with urinary hesitancy    Lower abdominal pain    Elevated troponin    Rash    Uncontrolled type 2 diabetes mellitus with hyperglycemia (HCC) 01/04/2021  Acute on chronic diastolic CHF (congestive heart failure) (HCC) 01/04/2021   CHF (congestive heart failure), NYHA class I, acute, diastolic (HCC) 01/04/2021   OSA (obstructive sleep apnea) 01/04/2021   Obesity (BMI 30-39.9) 01/04/2021   Chronic respiratory failure with hypoxia (HCC) 01/04/2021   Acute on chronic congestive heart failure (HCC)    Acute on chronic respiratory failure with hypoxia (HCC) 06/20/2020   NSTEMI (non-ST elevated myocardial infarction) (HCC) 02/25/2018   Obesity, Class III, BMI 40-49.9 (morbid obesity) (HCC) 01/19/2017   Chronic  obstructive pulmonary disease (COPD) (HCC) 05/13/2014   Type 2 diabetes mellitus with hyperglycemia, with long-term current use of insulin (HCC) 05/13/2014   Essential hypertension 05/13/2014   PCP:  [facility] Pharmacy:   [id number] [place], [place] - [address] [place] [address] Phone: [phone] Fax: [phone]  [id number] [place] - [address] [place] [address] Phone: [phone] Fax: [phone]     Social Determinants of Health (SDOH) Social History: SDOH Screenings   Food Insecurity: No Food Insecurity (02/19/2023)   [facility] from [facility]  Housing: High Risk (01/15/2023)  Transportation Needs: No Transportation Needs (02/19/2023)   Received from [facility]  Utilities: Patient Unable To Answer (01/15/2023)  Financial Resource Strain: Medium Risk (02/19/2023)   Received from [facility]  Physical Activity: Inactive (07/02/2021)   [facility]  Social Connections: Moderately Isolated (10/21/2022)   [facility]  Stress: Stress Concern Present (10/21/2022)   [facility]  Tobacco Use: Medium Risk (06/12/2023)  Health Literacy: Medium Risk (10/21/2022)   [facility] from [facility], [facility]   SDOH Interventions:     Readmission Risk Interventions    06/25/2022   10:13 AM  Readmission Risk Prevention Plan  Transportation Screening Complete  PCP or Specialist Appt within 3-5 Days Complete  HRI or Home Care Consult Complete  Social Work Consult for Recovery Care Planning/Counseling Complete  Palliative Care Screening Not Applicable  Medication Review Oceanographer) Complete

## 2023-06-13 NOTE — Discharge Instructions (Signed)
Rent/Utility/Housing  Agency Name: [facility] Address: [address] [place], [place] [address] Phone: [phone] Email: [email or web address] Website: www.alamanceservices.org Service(s) Offered: Housing services, self-sufficiency, congregate meal program, weatherization program, heating appliance  repair/replacement program, emergency food assistance,  housing counseling, home ownership program, wheels -towork program.  Agency Name: [facility] Address: [address], [place], [place] [address] Phone: [phone] (8a-4p) [phone] (8p- 10p) Email: [email or web address] Website: [email or web address].[email or web address] Service(s) Offered: A program for homeless and/or needy men that includes one-on-one counseling, life skills training and job rehabilitation.  Agency Name: [facility] Address: [address], [place], [place] [address] Phone: [phone] Website: www.alliedchurches.org Service(s) Offered: Assistance to needy in emergency with utility bills, heating fuel, and prescriptions. Shelter for homeless 7pm-7am. December 19, 2016 15  Agency Name: [facility] Address: [address], [place], [place] [address] Phone: [phone] Contact Person: Wayne Dawson Email: [email or web address] Website: www.arcnc.org Service(s) Offered: Helps individuals with developmental disabilities move from housing that is more restrictive to homes where they  can achieve greater independence and have more  opportunities.  Agency Name: [facility] Address: [address], [place], [place] [address] Phone: [phone] Email: [email or web address] Website: www.[facility].org Service(s) Offered: Provides affordable housing for low-income families, elderly, and disabled individuals. Offer a wide range of  programs and services, from financial planning to  afterschool and summer programs.  Agency Name: [facility] Address: [address] [place], [place], [place] [address] Phone: [phone] Service(s) Offered: Child support services; child welfare services; food stamps; Medicaid; work first family assistance; and aid with fuel,  rent, food and medicine.  Agency Name: [facility] Address: [facility] [place], [place]  [address] Phone: [phone] Website: www.familyabuseservices.org Service(s) Offered: 24 hour Crisis Line: [phone]; 24 hour Emergency Shelter; Transitional Housing; Support Groups; Court Advocacy; Community Education; Hispanic Outreach: [phone];  Visitation Center: [phone].  Agency Name: [facility] Address: [address] [place], [place] [address] Phone: [phone] Service(s) Offered: CAP Services; Home and Community Supports; Individual or Group Supports; Respite Care Non-Institutional Nursing;  Residential Supports; Respite Care and Personal Care Services; Transportation; Family and Friends Night; Recreational Activities; Three Nutritious Meals/Snacks; Consultation with Registered Dietician; Twenty-four hour Registered Nurse Access; Daily and Community Living Skills; Camp Green [employer]aves; Summer Camp for the Special Population (During Summer Months) Bingo Night (Every  Wednesday Night); Special Populations Dance Night  (Every Tuesday Night); Professional Hair Care Services.  Agency Name: [facility] Address: [address], [place], [place] [address] Phone: [phone] Contact Person: Gloria McCauley Website: [email or web address] Service(s) Offered: Residential treatment facility for women; food and  clothing, educational & employment development and  transportation to work; development of financial skills;  parenting and family reunification; emotional and spiritual  support;  transitional housing for program graduates.  Agency Name: [place] [facility] Address: [address], [place], [place] [address] Phone: [phone] Email: [email or web address] Website: [email or web address] Service(s) Offered: Public housing units for elderly, disabled, and low income people; housing choice vouchers for income eligible  applicants; shelter plus care vouchers; and HOPWA voucher program.  Agency Name: [facility] [facility] Address: [address], [place],  [address] Phone: [phone] Email: habitat1@netzero.net Website: www.habitatalamance.org Service(s) Offered: Build houses for families in need of decent housing. Each adult in the family must invest 200 hours of labor on  someone else's house, work with volunteers to build their own house, attend classes   on budgeting, home maintenance, yard care, and attend homeowner association meetings.  Agency Name: [facility] Address: [address], [place], [place] [address] Phone: [phone] Website: www.rsli.org Service(s) Offered: Intermediate care facilities for intellectually delayed, Supervised Living in group homes for adults with developmental disabilities, Supervised Living for people who have dual diagnoses (MRMI), Independent Living, Supported Living, respite and a variety of CAP services, pre-vocational services, day supports, and Community Support Services.  Agency Name: [place] [employer] Phone: [phone] Website: www.[place]oreclosurePrevention.gov Service(s) Offered: Zero-interest, deferred loans to homeowners struggling to pay their mortgage. Call for more information.  

## 2023-06-13 NOTE — Consult Note (Signed)
Consultation Note Date: 06/13/2023   [name]  DOB: [birth year]  MRN: [id number]  Age / Sex: 67 y.o., male  PCP: [facility] Referring Physician: Charise Killian, MD  Reason for Consultation: Establishing goals of care   HPI/Brief Hospital Course: 67 y.o. male  with past medical history of CHF, COPD, CAD, HTN, T2DM, OSA admitted from home on 06/12/2023 with chest pain and shortness of breath.   Cody Alexander reports he experienced shortness of breath x a few days prior to arriving to ED. Recently started Ozempic which had caused GI upset. Has not been wearing bipap recently as the parts were broken-have since been replaced but he needs assistance putting the machine back together.  Palliative medicine was consulted for assisting with goals of care conversations.  Subjective:  Extensive chart review has been completed prior to meeting patient including labs, vital signs, imaging, progress notes, orders, and available advanced directive documents from current and previous encounters.  Visited with Cody Alexander at his bedside. He is awake, alert, oriented, sitting up in chair. Reports breathing is much improved.  Introduced myself as a Publishing rights manager as a member of the palliative care team. Explained palliative medicine is specialized medical care for people living with serious illness. It focuses on providing relief from the symptoms and stress of a serious illness. The goal is to improve quality of life for both the patient and the family.   Cody Alexander shares he has been homeless in the past but about 3 years ago he was brought into a home by an elderly woman named Corporate treasurer. He and Margie lived independently until recently one of Margie's daughters and partner also moved into the home and he shares his concern related to their possible drug use. Cody Alexander does not currently have a car and relies on others  for transportation. He shares his struggles with finances but has someone helping him with available resources.  Cody Alexander shares he was a long time cigarette and marijuana smoker but quit both about 6 years ago. He is aware of his severe underlying COPD complicated by cardiac issues as well. He is thankful to be feeling better and is eager to return home.  Attempted to elicit goals of care. We discussed code status, Full Code versus Do Not Resuscitate. Encouraged Cody Alexander to consider DNR/DNI status understanding evidenced based poor outcomes in similar hospitalized patients, as the cause of the arrest is likely associated with chronic/terminal disease rather than a reversible acute cardio-pulmonary event.  At this time, Cody Alexander is clear he wishes to remain Full Code. He shares he would also be interested in living long term with trach/PEG and within a facility if necessary.  Cody Alexander shares he has not completed Advanced Directives in the past but is interested in doing so. Provided blue booklet and reviewed with him. He shares he wishes to appoint Debbrah Alar (Margie's daughter) as Product manager. He would like to take the day and evening to review the document himself and requests assistance completing tomorrow-will engage chaplain services at that time.  I discussed importance of continued conversations with family/support persons and all members of their medical team regarding overall plan of care and treatment options ensuring decisions are in alignment with patients goals of care.  All questions/concerns addressed. PMT will continue to follow and support patient as needed.  Objective: Primary Diagnoses: Present on Admission:  Respiratory failure [facility])   Physical Exam Constitutional:      General:  He is not in acute distress.    Appearance: He is obese. He is not ill-appearing.  Pulmonary:     Effort: Pulmonary effort is normal. No respiratory distress.  Skin:    General:  Skin is warm and dry.  Neurological:     Mental Status: He is alert and oriented to person, place, and time.  Psychiatric:        Mood and Affect: Mood normal.        Behavior: Behavior normal.        Thought Content: Thought content normal.     Vital Signs: BP 133/85   Pulse 87   Temp 97.8 F (36.6 C) (Oral)   Resp (!) 22   Ht 6' (1.829 m)   Wt (!) 140.2 kg   SpO2 99%   BMI 41.92 kg/m  Pain Scale: 0-10   Pain Score: 0-No pain  IO: Intake/output summary:  Intake/Output Summary (Last 24 hours) at 06/13/2023 1354 Last data filed at 06/13/2023 1302 Gross per 24 hour  Intake 47.63 ml  Output 3450 ml  Net -3402.37 ml    LBM: Last BM Date : 06/12/23 Baseline Weight: Weight: (!) 140.6 kg Most recent weight: Weight: (!) 140.2 kg       Palliative Assessment/Data: 80%    Palliative Prophylaxis:   Bowel Regimen, Delirium Protocol and Frequent Pain Assessment  Thank you for this consult and allowing Palliative Medicine to participate in the care of Donna Bernard. Palliative medicine will continue to follow and assist as needed.   Time Total: 75 minutes  Time spent includes: Detailed review of medical records (labs, imaging, vital signs), medically appropriate exam (mental status, respiratory, cardiac, skin), discussed with treatment team, counseling and educating patient, family and staff, documenting clinical information, medication management and coordination of care.   Signed by: Leeanne Deed, DNP, AGNP-C Palliative Medicine    Please contact Palliative Medicine Team phone at [phone] for questions and concerns.  For individual provider: See Loretha Stapler

## 2023-06-13 NOTE — Plan of Care (Signed)
  Problem: Respiratory: Goal: Will regain and/or maintain adequate ventilation Outcome: Progressing   Problem: Education: Goal: Knowledge of General Education information will improve Description: Including pain rating scale, medication(s)/side effects and non-pharmacologic comfort measures Outcome: Progressing   Problem: Health Behavior/Discharge Planning: Goal: Ability to manage health-related needs will improve Outcome: Progressing   Problem: Nutrition: Goal: Adequate nutrition will be maintained Outcome: Progressing   Problem: Coping: Goal: Level of anxiety will decrease Outcome: Progressing   Problem: Elimination: Goal: Will not experience complications related to urinary retention Outcome: Progressing   Problem: Fluid Volume: Goal: Ability to maintain a balanced intake and output will improve Outcome: Progressing

## 2023-06-14 DIAGNOSIS — J9621 Acute and chronic respiratory failure with hypoxia: Secondary | ICD-10-CM | POA: Diagnosis not present

## 2023-06-14 DIAGNOSIS — J441 Chronic obstructive pulmonary disease with (acute) exacerbation: Secondary | ICD-10-CM

## 2023-06-14 DIAGNOSIS — Z515 Encounter for palliative care: Secondary | ICD-10-CM | POA: Diagnosis not present

## 2023-06-14 DIAGNOSIS — J9622 Acute and chronic respiratory failure with hypercapnia: Secondary | ICD-10-CM | POA: Diagnosis not present

## 2023-06-14 LAB — CBC
HCT: 41.1 % (ref 39.0–52.0)
Hemoglobin: 12.6 g/dL — ABNORMAL LOW (ref 13.0–17.0)
MCH: 28.4 pg (ref 26.0–34.0)
MCHC: 30.7 g/dL (ref 30.0–36.0)
MCV: 92.8 fL (ref 80.0–100.0)
Platelets: 215 10*3/uL (ref 150–400)
RBC: 4.43 MIL/uL (ref 4.22–5.81)
RDW: 13 % (ref 11.5–15.5)
WBC: 6.3 10*3/uL (ref 4.0–10.5)
nRBC: 0 % (ref 0.0–0.2)

## 2023-06-14 LAB — BASIC METABOLIC PANEL
Anion gap: 6 (ref 5–15)
BUN: 21 mg/dL (ref 8–23)
CO2: 36 mmol/L — ABNORMAL HIGH (ref 22–32)
Calcium: 8.3 mg/dL — ABNORMAL LOW (ref 8.9–10.3)
Chloride: 95 mmol/L — ABNORMAL LOW (ref 98–111)
Creatinine, Ser: 1.04 mg/dL (ref 0.61–1.24)
GFR, Estimated: >60 mL/min (ref >60)
Glucose, Bld: 134 mg/dL — ABNORMAL HIGH (ref 70–99)
Potassium: 3.4 mmol/L — ABNORMAL LOW (ref 3.5–5.1)
Sodium: 137 mmol/L (ref 135–145)

## 2023-06-14 LAB — GLUCOSE, CAPILLARY
Glucose-Capillary: 117 mg/dL — ABNORMAL HIGH (ref 70–99)
Glucose-Capillary: 154 mg/dL — ABNORMAL HIGH (ref 70–99)
Glucose-Capillary: 160 mg/dL — ABNORMAL HIGH (ref 70–99)
Glucose-Capillary: 164 mg/dL — ABNORMAL HIGH (ref 70–99)
Glucose-Capillary: 178 mg/dL — ABNORMAL HIGH (ref 70–99)
Glucose-Capillary: 189 mg/dL — ABNORMAL HIGH (ref 70–99)

## 2023-06-14 MED ORDER — PREGABALIN 50 MG PO CAPS
50.0000 mg | ORAL_CAPSULE | Freq: Three times a day (TID) | ORAL | Status: DC
  Administered 2023-06-14 – 2023-06-15 (×3): 50 mg via ORAL
  Filled 2023-06-14 (×3): qty 1

## 2023-06-14 MED ORDER — INSULIN ASPART 100 UNIT/ML IJ SOLN
0.0000 [IU] | Freq: Three times a day (TID) | INTRAMUSCULAR | Status: DC
  Administered 2023-06-14 (×2): 3 [IU] via SUBCUTANEOUS
  Administered 2023-06-15: 8 [IU] via SUBCUTANEOUS
  Administered 2023-06-15: 11 [IU] via SUBCUTANEOUS
  Administered 2023-06-15 (×2): 3 [IU] via SUBCUTANEOUS
  Administered 2023-06-16: 8 [IU] via SUBCUTANEOUS
  Administered 2023-06-16: 11 [IU] via SUBCUTANEOUS
  Administered 2023-06-16: 3 [IU] via SUBCUTANEOUS
  Administered 2023-06-16: 8 [IU] via SUBCUTANEOUS
  Administered 2023-06-17: 11 [IU] via SUBCUTANEOUS
  Administered 2023-06-17: 2 [IU] via SUBCUTANEOUS
  Administered 2023-06-17: 5 [IU] via SUBCUTANEOUS
  Administered 2023-06-17 – 2023-06-18 (×2): 8 [IU] via SUBCUTANEOUS
  Administered 2023-06-18: 15 [IU] via SUBCUTANEOUS
  Administered 2023-06-18 (×2): 5 [IU] via SUBCUTANEOUS
  Administered 2023-06-19: 3 [IU] via SUBCUTANEOUS
  Filled 2023-06-14 (×20): qty 1

## 2023-06-14 MED ORDER — METHYLPREDNISOLONE SODIUM SUCC 40 MG IJ SOLR
40.0000 mg | Freq: Every day | INTRAMUSCULAR | Status: DC
  Administered 2023-06-14 – 2023-06-16 (×3): 40 mg via INTRAVENOUS
  Filled 2023-06-14 (×3): qty 1

## 2023-06-14 MED ORDER — HYDROXYZINE HCL 10 MG PO TABS
10.0000 mg | ORAL_TABLET | Freq: Three times a day (TID) | ORAL | Status: DC | PRN
  Administered 2023-06-14: 10 mg via ORAL
  Filled 2023-06-14 (×2): qty 1

## 2023-06-14 NOTE — Plan of Care (Signed)
  Problem: Health Behavior/Discharge Planning: Goal: Ability to manage health-related needs will improve Outcome: Progressing   Problem: Clinical Measurements: Goal: Respiratory complications will improve Outcome: Progressing   Problem: Activity: Goal: Risk for activity intolerance will decrease Outcome: Progressing   Problem: Nutrition: Goal: Adequate nutrition will be maintained Outcome: Progressing   Problem: Pain Managment: Goal: General experience of comfort will improve Outcome: Progressing   Problem: Safety: Goal: Ability to remain free from injury will improve Outcome: Progressing   Problem: Fluid Volume: Goal: Ability to maintain a balanced intake and output will improve Outcome: Progressing   Problem: Health Behavior/Discharge Planning: Goal: Ability to identify and utilize available resources and services will improve Outcome: Progressing Goal: Ability to manage health-related needs will improve Outcome: Progressing   Problem: Metabolic: Goal: Ability to maintain appropriate glucose levels will improve Outcome: Progressing   Problem: Nutritional: Goal: Maintenance of adequate nutrition will improve Outcome: Progressing Goal: Progress toward achieving an optimal weight will improve Outcome: Progressing

## 2023-06-14 NOTE — Progress Notes (Addendum)
PROGRESS NOTE    Cody Alexander  WUJ:[id number] DOB: 1956-06-21 DOA: 06/12/2023 PCP: [facility]   Assessment & Plan:   Principal Problem:   Respiratory failure (HCC)  Assessment and Plan:  Acute diastolic CHF: continue on lasix today and hold lasix tomorrow. Monitor I/Os  COPD exacerbation: bronchodilators, encourage incentive spirometry, continue on supplemental oxygen and wean as tolerated. Had to go back on BiPAP overnight. Will start steroids.   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 Continue on supplemental oxygen and wean as tolerated. Uses 4-5L [place] chronically at home   DM2: likely poorly controlled. Continue on SSI w/ accuchecks   Morbid obesity: BMI 41.9. Complicates overall care & prognosis   HLD: continue on statin     DVT prophylaxis: heparin  Code Status: full  Family Communication:  Disposition Plan:  PT recs HH   Level of care: Progressive Status is: Inpatient Remains inpatient appropriate because: severity of illness    Consultants:    Procedures:   Antimicrobials:    Subjective: Pt c/o shortness of breath   Objective: Vitals:   06/14/23 0400 06/14/23 0500 06/14/23 0600 06/14/23 0746  BP: (!) 132/93 (!) 140/81 129/83 129/83  Pulse: 69 73 70   Resp: 15 15 16    Temp: 97.9 F (36.6 C)   98 F (36.7 C)  TempSrc:    Oral  SpO2: 96% 92% 99%   Weight:      Height:        Intake/Output Summary (Last 24 hours) at 06/14/2023 0850 Last data filed at 06/14/2023 0845 Gross per 24 hour  Intake --  Output 2175 ml  Net -2175 ml   Filed Weights   06/12/23 1119 06/12/23 1842 06/13/23 0337  Weight: (!) 140.6 kg (!) 141.6 kg (!) 140.2 kg    Examination:  General exam: Appears calm and comfortable. Morbid obesity  Respiratory system: severely diminished breath sounds b/l  Cardiovascular system: S1 &  S2+. No rubs, gallops or clicks Gastrointestinal system: Abdomen is obese, soft and nontender. Normal bowel sounds heard. Central nervous system: Alert and oriented. Moves all extremities  Psychiatry: Judgement and insight appear normal. Mood & affect appropriate.     Data Reviewed: I have personally reviewed following labs and imaging studies  CBC: Recent Labs  Lab 06/12/23 1122 06/12/23 1758 06/13/23 0434 06/14/23 0337  WBC 8.3 7.2 7.1 6.3  NEUTROABS 6.5  --   --   --   HGB 12.4* 12.8* 12.1* 12.6*  HCT 42.7 44.0 40.0 41.1  MCV 97.7 96.7 94.8 92.8  PLT 232 225 210 215   Basic Metabolic Panel: Recent Labs  Lab 06/12/23 1122 06/12/23 1758 06/13/23 0434 06/14/23 0337  NA 140  --  139 137  K 4.7  --  3.5 3.4*  CL 90*  --  91* 95*  CO2 43*  --  36* 36*  GLUCOSE 198*  --  147* 134*  BUN 16  --  16 21  CREATININE 0.96 0.94 0.90 1.04  CALCIUM 8.6*  --  8.8* 8.3*  MG  --   --  2.2  --   PHOS  --   --  3.1  --    GFR: Estimated Creatinine Clearance: 101.4 mL/min (by C-G formula based on SCr of 1.04 mg/dL). Liver Function Tests: Recent Labs  Lab 06/12/23 1122  AST 21  ALT 25  ALKPHOS  58  BILITOT 1.0  PROT 7.3  ALBUMIN 3.6   No results for input(s): "LIPASE", "AMYLASE" in the last 168 hours. No results for input(s): "AMMONIA" in the last 168 hours. Coagulation Profile: No results for input(s): "INR", "PROTIME" in the last 168 hours. Cardiac Enzymes: No results for input(s): "CKTOTAL", "CKMB", "CKMBINDEX", "TROPONINI" in the last 168 hours. BNP (last 3 results) No results for input(s): "PROBNP" in the last 8760 hours. HbA1C: No results for input(s): "HGBA1C" in the last 72 hours. CBG: Recent Labs  Lab 06/13/23 1750 06/13/23 2053 06/14/23 0006 06/14/23 0350 06/14/23 0748  GLUCAP 126* 204* 154* 117* 164*   Lipid Profile: No results for input(s): "CHOL", "HDL", "LDLCALC", "TRIG", "CHOLHDL", "LDLDIRECT" in the last 72 hours. Thyroid Function Tests: No  results for input(s): "TSH", "T4TOTAL", "FREET4", "T3FREE", "THYROIDAB" in the last 72 hours. Anemia Panel: No results for input(s): "VITAMINB12", "FOLATE", "FERRITIN", "TIBC", "IRON", "RETICCTPCT" in the last 72 hours. Sepsis Labs: No results for input(s): "PROCALCITON", "LATICACIDVEN" in the last 168 hours.  No results found for this or any previous visit (from the past 240 hour(s)).       Radiology Studies: DG Chest Port 1 View  Result Date: 06/12/2023 CLINICAL DATA:  shortness of breath EXAM: PORTABLE CHEST 1 VIEW COMPARISON:  May 2024 FINDINGS: The heart appears enlarged. No pleural effusion. No pneumothorax. No lobar consolidation. Query pulmonary vascular congestion. No acute osseous abnormality. IMPRESSION: Cardiomegaly with pulmonary vascular congestion. No signs of overt edema or effusion. Electronically Signed   By: Olive Bass M.D.   On: 06/12/2023 14:20        Scheduled Meds:  aspirin EC  81 mg Oral Daily   atorvastatin  40 mg Oral Daily   Chlorhexidine Gluconate Cloth  6 each Topical Q0600   furosemide  40 mg Intravenous Daily   heparin  5,000 Units Subcutaneous Q8H   insulin aspart  0-15 Units Subcutaneous Q4H   mouth rinse  15 mL Mouth Rinse 4 times per day   sodium chloride flush  3 mL Intravenous Q12H   Continuous Infusions:   LOS: 2 days       Charise Killian, MD [facility] Pager [phone]  If 7PM-7AM, please contact night-coverage [email or web address] 06/14/2023, 8:50 AM

## 2023-06-14 NOTE — Evaluation (Addendum)
Physical Therapy Evaluation Patient Details Name: Cody Alexander MRN: [id number] DOB: [birth year] Today's Date: 06/14/2023  History of Present Illness  67 year old male with history of CHF, COPD, CAD, HTN, T2DM, OSA presenting to the Emergency Department for chest pain and shortness of breath. 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  Clinical Impression  Pt was pleasant and motivated to participate during the session and put forth good effort throughout. Pt is currently Mod I for bed mobility and transfers, needing increased time and light use of hand rails. He shows good weight shift and balance during STS. Once up, pt having concerns of dizziness, but quickly resolved. He was able to perform ~60 foot bout of amb with RW, with pt needing to take 3 standing rest breaks for ~15 seconds each 2/2 mild DOE. Vitals monitored throughout with SpO2 dropping to 85% on 4L O2 but pt reports no dizziness. He shows good breathing strategies with O2 values returning >88%. Pt walking with steppage/neuropathic gait and shortened steps but overall stable with RW. Provided education on more consistent use of RW in home to ensure safety. Pt will benefit from continued PT services upon discharge to safely address deficits listed in patient problem list for decreased caregiver assistance and eventual return to PLOF.          If plan is discharge home, recommend the following: A little help with walking and/or transfers;A little help with bathing/dressing/bathroom;Help with stairs or ramp for entrance;Assist for transportation;Direct supervision/assist for financial management;Assistance with cooking/housework   Can travel by private vehicle        Equipment Recommendations None recommended by PT  Recommendations for Other Services       Functional Status Assessment Patient has had a recent decline in their functional status  and demonstrates the ability to make significant improvements in function in a reasonable and predictable amount of time.     Precautions / Restrictions Precautions Precautions: Fall Restrictions Weight Bearing Restrictions: No      Mobility  Bed Mobility Overal bed mobility: Modified Independent             General bed mobility comments: light use of bed rails    Transfers Overall transfer level: Modified independent Equipment used: Rolling walker (2 wheels) Transfers: Sit to/from Stand Sit to Stand: Modified independent (Device/Increase time)           General transfer comment: no physical assist or cue provided for STS, showing good weight shift and stability.    Ambulation/Gait Ambulation/Gait assistance: Contact guard assist Gait Distance (Feet): 60 Feet Assistive device: Rolling walker (2 wheels) Gait Pattern/deviations: Decreased step length - right, Decreased step length - left, Decreased stride length, Steppage, Step-through pattern Gait velocity: decreased     General Gait Details: Steppage gait pattern, short strides but able to fully clear foot for stepping. Pt showing good management of RW,  Stairs            Wheelchair Mobility     Tilt Bed    Modified Rankin (Stroke Patients Only)       Balance Overall balance assessment: Needs assistance Sitting-balance support: Feet supported Sitting balance-Leahy Scale: Normal     Standing balance support: During functional activity, Bilateral upper extremity supported Standing balance-Leahy Scale: Fair                               Pertinent Vitals/Pain  Pain Assessment Pain Assessment: 0-10 Pain Score: 7  Pain Location: Diffuse neuropathy pain in LE's, R great toe Pain Descriptors / Indicators: Sore, Aching, Constant, Dull Pain Intervention(s): Monitored during session, Limited activity within patient's tolerance    Home Living Family/patient expects to be discharged to::  Private residence Living Arrangements: Non-relatives/Friends Available Help at Discharge: Available PRN/intermittently;Friend(s) Type of Home: Mobile home Home Access: Stairs to enter Entrance Stairs-Rails: Left Entrance Stairs-Number of Steps: 4   Home Layout: One level Home Equipment: Wheelchair - Forensic psychologist (2 wheels);Cane - single point      Prior Function Prior Level of Function : Independent/Modified Independent;History of Falls (last six months)             Mobility Comments: Grossly Ind, PRN use of AD's, on 4L O2. Endorses 3 falls recently, where he got tripped up when trying to move in the house. Typically uses 3L; but endorses need for greater supplemental O2 up to 5L just  prior to arrival ADLs Comments: per pt report: doesnt currently need assistance, but thinks he probabaly does     Extremity/Trunk Assessment   Upper Extremity Assessment Upper Extremity Assessment: Overall WFL for tasks assessed    Lower Extremity Assessment Lower Extremity Assessment: Overall WFL for tasks assessed; typical distal LE neuropathy presentation       Communication   Communication Communication: No apparent difficulties  Cognition Arousal: Alert Behavior During Therapy: WFL for tasks assessed/performed Overall Cognitive Status: Within Functional Limits for tasks assessed                                          General Comments      Exercises     Assessment/Plan    PT Assessment Patient needs continued PT services  PT Problem List Decreased strength;Decreased coordination;Decreased range of motion;Decreased activity tolerance;Decreased balance;Decreased mobility       PT Treatment Interventions DME instruction;Balance training;Gait training;Stair training;Functional mobility training;Therapeutic activities;Therapeutic exercise    PT Goals (Current goals can be found in the Care Plan section)  Acute Rehab PT Goals Patient Stated Goal:  walk more PT Goal Formulation: With patient Time For Goal Achievement: 06/27/23 Potential to Achieve Goals: Fair    Frequency Min 1X/week     Co-evaluation               AM-PAC PT "6 Clicks" Mobility  Outcome Measure Help needed turning from your back to your side while in a flat bed without using bedrails?: None Help needed moving from lying on your back to sitting on the side of a flat bed without using bedrails?: A Little Help needed moving to and from a bed to a chair (including a wheelchair)?: A Little Help needed standing up from a chair using your arms (e.g., wheelchair or bedside chair)?: A Little Help needed to walk in hospital room?: A Little Help needed climbing 3-5 steps with a railing? : A Little 6 Click Score: 19    End of Session Equipment Utilized During Treatment: Gait belt Activity Tolerance: Patient tolerated treatment well Patient left: in chair;Other (comment) (In care of OT) Nurse Communication: Mobility status PT Visit Diagnosis: Repeated falls (R29.6);Difficulty in walking, not elsewhere classified (R26.2);Other abnormalities of gait and mobility (R26.89)    Time: 1327-1400 PT Time Calculation (min) (ACUTE ONLY): 33 min   Charges:  Cecile Sheerer, SPT 06/14/23, 3:37 PM This entire session was performed under direct supervision and direction of a licensed therapist/therapist assistant. I have personally read, edited and approve of the note as written.  Loran Senters, DPT

## 2023-06-14 NOTE — Progress Notes (Signed)
Daily Progress Note   [name]       Date: 06/14/2023 DOB: 1955-11-12  Age: 67 y.o. MRN#: [id number] Attending Physician: Charise Killian, MD Primary Care Physician: [facility] Admit Date: 06/12/2023  Reason for Consultation/Follow-up: Establishing goals of care  HPI/Brief Hospital Review: 67 y.o. male  with past medical history of CHF, COPD, CAD, HTN, T2DM, OSA admitted from home on 06/12/2023 with chest pain and shortness of breath.    Cody Alexander reports he experienced shortness of breath x a few days prior to arriving to ED. Recently started Ozempic which had caused GI upset. Has not been wearing bipap recently as the parts were broken-have since been replaced but he needs assistance putting the machine back together.   Palliative medicine was consulted for assisting with goals of care conversations.   Subjective: Extensive chart review has been completed prior to meeting patient including labs, vital signs, imaging, progress notes, orders, and available advanced directive documents from current and previous encounters.    Visited with Cody Alexander at his bedside. He has been moved out of ICU to progressive care unit. He is awake, alert and able to engage in conversations.  He shares his breathing continues to feel better and that he had a restful evening.  AD booklet at bedside, he shares he has not had an opportunity to look over and will probably wait a few days to review as his main focus at this time is resting and catching up on sleep he missed prior to hospitalization.  No ongoing acute palliative needs. PMT will shadow. Encouraged Cody Alexander to reach out to PMT if needs or concerns arise.   Thank you for allowing the  Palliative Medicine Team to assist in the care of this patient.  Total time:  25 minutes  Time spent includes: Detailed review of medical records (labs, imaging, vital signs), medically appropriate exam (mental status, respiratory, cardiac, skin), discussed with treatment team, counseling and educating patient, family and staff, documenting clinical information, medication management and coordination of care.  Leeanne Deed, DNP, AGNP-C Palliative Medicine   Please contact Palliative Medicine Team phone at [phone] for questions and concerns.

## 2023-06-14 NOTE — Evaluation (Signed)
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 Pt is alert and oriented x4, agreeable to OT evaluation. PTA pt reports he is MOD I in ADL however increasing difficulties with LB dressing/bathing. Pt amb household/short community distances with PRN use of AD. Pt requires MIN A for STS with no AD, MIN A for short amb transfers in room with no AD, MOD A for LB dressing/bathing for thoroughness with pt unable to perform figure 4 and increase SOB with forward trunk flexion. MOD A for peri care with noted ?thoroughness after previous BM. Pt presents with deficits in strength, endurance, activity tolerance, balance affecting safe and optimal ADL completion. Pt will benefit from ongoing OT to address deficits and to facilitate optimal ADL performance. OT will follow acutely.       If plan is discharge home, recommend the following: A little help with walking and/or transfers;A little help with bathing/dressing/bathroom;Assistance with cooking/housework;Assist for transportation;Help with stairs or ramp for entrance    Functional Status Assessment  Patient has had a recent decline in their functional status and demonstrates the ability to make significant improvements in function in a reasonable and predictable amount of time.  Equipment Recommendations  BSC/3in1    Recommendations for Other Services       Precautions / Restrictions Precautions Precautions: Fall Restrictions Weight Bearing Restrictions: No      Mobility Bed Mobility                    Transfers Overall  transfer level: Needs assistance Equipment used: None Transfers: Sit to/from Stand Sit to Stand: Min assist                  Balance Overall balance assessment: Needs assistance Sitting-balance support: Feet supported Sitting balance-Leahy Scale: Good     Standing balance support: No upper extremity supported Standing balance-Leahy Scale: Fair                             ADL either performed or assessed with clinical judgement   ADL Overall ADL's : Needs assistance/impaired Eating/Feeding: Set up;Sitting   Grooming: Wash/dry face;Sitting;Set up       Lower Body Bathing: Moderate assistance;Sit to/from stand Lower Body Bathing Details (indicate cue type and reason): unable to reach feet, simulated     Lower Body Dressing: Moderate assistance Lower Body Dressing Details (indicate cue type and reason): socks Toilet Transfer: Minimal assistance;Rolling walker (2 wheels) Toilet Transfer Details (indicate cue type and reason): simulated Toileting- Clothing Manipulation and Hygiene: Moderate assistance;Sit to/from stand Toileting - Clothing Manipulation Details (indicate cue type and reason): for thoroughness     Functional mobility during ADLs: Minimal assistance;Rolling walker (2 wheels)       Vision Patient Visual Report: No change from baseline       Perception         Praxis         Pertinent Vitals/Pain Pain Assessment Pain Assessment: 0-10 Pain Score: 7  Pain Location: BLE Pain Descriptors / Indicators: Sore, Aching, Constant, Dull Pain Intervention(s):  Limited activity within patient's tolerance, Monitored during session, Repositioned     Extremity/Trunk Assessment Upper Extremity Assessment Upper Extremity Assessment: Overall WFL for tasks assessed   Lower Extremity Assessment Lower Extremity Assessment: Generalized weakness       Communication Communication Communication: No apparent difficulties Cueing Techniques: Verbal  cues;Visual cues   Cognition Arousal: Alert Behavior During Therapy: WFL for tasks assessed/performed Overall Cognitive Status: Within Functional Limits for tasks assessed                                       General Comments  vss throughout, nurse in room at end of session to adjust lindes/leads    Exercises Other Exercises Other Exercises: edu re: role of OT, role of rehab, safe ADL completion   Shoulder Instructions      Home Living Family/patient expects to be discharged to:: Private residence Living Arrangements: Non-relatives/Friends (67 year old roommate, her daugther) Available Help at Discharge: Available PRN/intermittently;Friend(s) Type of Home: Mobile home Home Access: Stairs to enter Entrance Stairs-Number of Steps: 4 Entrance Stairs-Rails: Left Home Layout: One level     Bathroom Shower/Tub: Chief Strategy Officer: Standard Bathroom Accessibility: No   Home Equipment: Wheelchair - Forensic psychologist (2 wheels);Cane - single point          Prior Functioning/Environment Prior Level of Function : Independent/Modified Independent;History of Falls (last six months)             Mobility Comments: generally MOD I household/short community distances with AD PRN, ADLs Comments: MOD I for feeding,grooming, dressing, bathing; however reports significant difficulties with LB dressing/bathing at this time; generally MOD I with ADLs however does not drive        OT Problem List: Decreased activity tolerance;Decreased knowledge of use of DME or AE;Decreased strength;Decreased cognition;Decreased safety awareness;Decreased knowledge of precautions      OT Treatment/Interventions: Self-care/ADL training;DME and/or AE instruction;Therapeutic activities;Balance training;Therapeutic exercise;Patient/family education;Energy conservation    OT Goals(Current goals can be found in the care plan section) Acute Rehab OT Goals Patient Stated  Goal: feel better OT Goal Formulation: With patient Time For Goal Achievement: 06/28/23 Potential to Achieve Goals: Good ADL Goals Pt Will Perform Grooming: with modified independence;standing Pt Will Perform Lower Body Dressing: with modified independence;sit to/from stand Pt Will Transfer to Toilet: with modified independence;ambulating Pt Will Perform Toileting - Clothing Manipulation and hygiene: with modified independence;sit to/from stand Additional ADL Goal #1: pt will demonstrate improved energy conservation techniques as evidenced by performing ADL utilizing 1 EC technique with no vcs required  OT Frequency: Min 1X/week    Co-evaluation              AM-PAC OT "6 Clicks" Daily Activity     Outcome Measure Help from another person eating meals?: None Help from another person taking care of personal grooming?: None Help from another person toileting, which includes using toliet, bedpan, or urinal?: A Lot Help from another person bathing (including washing, rinsing, drying)?: A Lot Help from another person to put on and taking off regular upper body clothing?: A Little Help from another person to put on and taking off regular lower body clothing?: A Lot 6 Click Score: 17   End of Session Equipment Utilized During Treatment: Rolling walker (2 wheels) Nurse Communication: Mobility status  Activity Tolerance: Patient tolerated treatment well Patient left: in chair;with call bell/phone within reach;with nursing/sitter in  room;with chair alarm set  OT Visit Diagnosis: Unsteadiness on feet (R26.81);Other abnormalities of gait and mobility (R26.89)                Time: 1400-1420 OT Time Calculation (min): 20 min Charges:  OT General Charges $OT Visit: 1 Visit OT Evaluation $OT Eval Moderate Complexity: 1 Mod  Oleta Mouse, OTD OTR/L  06/14/23, 3:49 PM

## 2023-06-15 DIAGNOSIS — J441 Chronic obstructive pulmonary disease with (acute) exacerbation: Secondary | ICD-10-CM | POA: Diagnosis not present

## 2023-06-15 DIAGNOSIS — J9621 Acute and chronic respiratory failure with hypoxia: Secondary | ICD-10-CM | POA: Diagnosis not present

## 2023-06-15 DIAGNOSIS — J9622 Acute and chronic respiratory failure with hypercapnia: Secondary | ICD-10-CM | POA: Diagnosis not present

## 2023-06-15 LAB — CBC
HCT: 42.3 % (ref 39.0–52.0)
Hemoglobin: 13.1 g/dL (ref 13.0–17.0)
MCH: 28.4 pg (ref 26.0–34.0)
MCHC: 31 g/dL (ref 30.0–36.0)
MCV: 91.8 fL (ref 80.0–100.0)
Platelets: 242 10*3/uL (ref 150–400)
RBC: 4.61 MIL/uL (ref 4.22–5.81)
RDW: 12.7 % (ref 11.5–15.5)
WBC: 6.9 10*3/uL (ref 4.0–10.5)
nRBC: 0 % (ref 0.0–0.2)

## 2023-06-15 LAB — BASIC METABOLIC PANEL
Anion gap: 8 (ref 5–15)
BUN: 24 mg/dL — ABNORMAL HIGH (ref 8–23)
CO2: 35 mmol/L — ABNORMAL HIGH (ref 22–32)
Calcium: 8.4 mg/dL — ABNORMAL LOW (ref 8.9–10.3)
Chloride: 92 mmol/L — ABNORMAL LOW (ref 98–111)
Creatinine, Ser: 0.92 mg/dL (ref 0.61–1.24)
GFR, Estimated: >60 mL/min (ref >60)
Glucose, Bld: 289 mg/dL — ABNORMAL HIGH (ref 70–99)
Potassium: 4.4 mmol/L (ref 3.5–5.1)
Sodium: 135 mmol/L (ref 135–145)

## 2023-06-15 LAB — GLUCOSE, CAPILLARY
Glucose-Capillary: 176 mg/dL — ABNORMAL HIGH (ref 70–99)
Glucose-Capillary: 172 mg/dL — ABNORMAL HIGH (ref 70–99)
Glucose-Capillary: 297 mg/dL — ABNORMAL HIGH (ref 70–99)
Glucose-Capillary: 322 mg/dL — ABNORMAL HIGH (ref 70–99)

## 2023-06-15 MED ORDER — HYDROCORTISONE 1 % EX CREA
TOPICAL_CREAM | Freq: Three times a day (TID) | CUTANEOUS | Status: DC
  Administered 2023-06-15 – 2023-06-17 (×3): 1 via TOPICAL
  Filled 2023-06-15: qty 28

## 2023-06-15 MED ORDER — PREGABALIN 75 MG PO CAPS
75.0000 mg | ORAL_CAPSULE | Freq: Three times a day (TID) | ORAL | Status: DC
  Administered 2023-06-15 – 2023-06-17 (×6): 75 mg via ORAL
  Filled 2023-06-15 (×6): qty 1

## 2023-06-15 NOTE — Plan of Care (Signed)
  Problem: Clinical Measurements: Goal: Ability to maintain clinical measurements within normal limits will improve Outcome: Progressing   Problem: Clinical Measurements: Goal: Respiratory complications will improve Outcome: Progressing   Problem: Pain Managment: Goal: General experience of comfort will improve Outcome: Progressing   Problem: Safety: Goal: Ability to remain free from injury will improve Outcome: Progressing   

## 2023-06-15 NOTE — Progress Notes (Signed)
PROGRESS NOTE    Cody Alexander  WUX:[id number] DOB: 27-Nov-1955 DOA: 06/12/2023 PCP: [facility]   Assessment & Plan:   Principal Problem:   Respiratory failure (HCC)  Assessment and Plan:  Acute diastolic CHF: holding lasix today. Monitor I/Os  COPD exacerbation: continue on steroids, bronchodilators, encourage incentive spirometry. Continue on supplemental oxygen.    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 Uses BiPAP at bedtime. Continue on supplemental oxygen & back at baseline. Uses 4-5L [place] chronically at home   DM2: likely poorly controlled. Continue on SSI w/ accuchecks  Peripheral neuropathy: started on pregabalin and increased dose today   Morbid obesity: BMI 41.9. Complicates overall care & prognosis   HLD: continue on statin     DVT prophylaxis: heparin  Code Status: full  Family Communication:  Disposition Plan:  PT recs HH   Level of care: Progressive Status is: Inpatient Remains inpatient appropriate because: severity of illness    Consultants:    Procedures:   Antimicrobials:    Subjective: Pt c/o numbness & tingling in feet   Objective: Vitals:   06/14/23 1707 06/14/23 2001 06/14/23 2334 06/15/23 0343  BP: (!) 134/90 (!) 140/85 139/85 115/72  Pulse: 77 77 87 72  Resp: 20 18 20 20   Temp: 98 F (36.7 C) 98 F (36.7 C) 98.4 F (36.9 C) 98.3 F (36.8 C)  TempSrc: Oral  Oral   SpO2: 96% 95% 90% 93%  Weight:    (!) 140.3 kg  Height:        Intake/Output Summary (Last 24 hours) at 06/15/2023 0832 Last data filed at 06/14/2023 2334 Gross per 24 hour  Intake 680 ml  Output 1025 ml  Net -345 ml   Filed Weights   06/12/23 1842 06/13/23 0337 06/15/23 0343  Weight: (!) 141.6 kg (!) 140.2 kg (!) 140.3 kg    Examination:  General exam: Appears comfortable. Morbidly obese Respiratory system: decreased breath sounds b/l  Cardiovascular  system: S1/S2+. No rubs or gallops Gastrointestinal system: Abd is soft, NT, obese & hypoactive bowel sounds Central nervous system: alert & oriented. Moves all extremities  Psychiatry: judgement and insight is at baseline. Appropriate mood and affect    Data Reviewed: I have personally reviewed following labs and imaging studies  CBC: Recent Labs  Lab 06/12/23 1122 06/12/23 1758 06/13/23 0434 06/14/23 0337 06/15/23 0338  WBC 8.3 7.2 7.1 6.3 6.9  NEUTROABS 6.5  --   --   --   --   HGB 12.4* 12.8* 12.1* 12.6* 13.1  HCT 42.7 44.0 40.0 41.1 42.3  MCV 97.7 96.7 94.8 92.8 91.8  PLT 232 225 210 215 242   Basic Metabolic Panel: Recent Labs  Lab 06/12/23 1122 06/12/23 1758 06/13/23 0434 06/14/23 0337 06/15/23 0338  NA 140  --  139 137 135  K 4.7  --  3.5 3.4* 4.4  CL 90*  --  91* 95* 92*  CO2 43*  --  36* 36* 35*  GLUCOSE 198*  --  147* 134* 289*  BUN 16  --  16 21 24*  CREATININE 0.96 0.94 0.90 1.04 0.92  CALCIUM 8.6*  --  8.8* 8.3* 8.4*  MG  --   --  2.2  --   --   PHOS  --   --  3.1  --   --    GFR: Estimated Creatinine Clearance: 114.7 mL/min (by C-G formula based on SCr of  0.92 mg/dL). Liver Function Tests: Recent Labs  Lab 06/12/23 1122  AST 21  ALT 25  ALKPHOS 58  BILITOT 1.0  PROT 7.3  ALBUMIN 3.6   No results for input(s): "LIPASE", "AMYLASE" in the last 168 hours. No results for input(s): "AMMONIA" in the last 168 hours. Coagulation Profile: No results for input(s): "INR", "PROTIME" in the last 168 hours. Cardiac Enzymes: No results for input(s): "CKTOTAL", "CKMB", "CKMBINDEX", "TROPONINI" in the last 168 hours. BNP (last 3 results) No results for input(s): "PROBNP" in the last 8760 hours. HbA1C: No results for input(s): "HGBA1C" in the last 72 hours. CBG: Recent Labs  Lab 06/14/23 0350 06/14/23 0748 06/14/23 1133 06/14/23 1710 06/14/23 2002  GLUCAP 117* 164* 178* 160* 189*   Lipid Profile: No results for input(s): "CHOL", "HDL",  "LDLCALC", "TRIG", "CHOLHDL", "LDLDIRECT" in the last 72 hours. Thyroid Function Tests: No results for input(s): "TSH", "T4TOTAL", "FREET4", "T3FREE", "THYROIDAB" in the last 72 hours. Anemia Panel: No results for input(s): "VITAMINB12", "FOLATE", "FERRITIN", "TIBC", "IRON", "RETICCTPCT" in the last 72 hours. Sepsis Labs: No results for input(s): "PROCALCITON", "LATICACIDVEN" in the last 168 hours.  No results found for this or any previous visit (from the past 240 hour(s)).       Radiology Studies: No results found.      Scheduled Meds:  aspirin EC  81 mg Oral Daily   atorvastatin  40 mg Oral Daily   Chlorhexidine Gluconate Cloth  6 each Topical Q0600   heparin  5,000 Units Subcutaneous Q8H   hydrocortisone cream   Topical TID   insulin aspart  0-15 Units Subcutaneous TID AC & HS   methylPREDNISolone (SOLU-MEDROL) injection  40 mg Intravenous Daily   mouth rinse  15 mL Mouth Rinse 4 times per day   pregabalin  50 mg Oral TID   sodium chloride flush  3 mL Intravenous Q12H   Continuous Infusions:   LOS: 3 days       Charise Killian, MD [facility] Pager [phone]  If 7PM-7AM, please contact night-coverage [email or web address] 06/15/2023, 8:32 AM

## 2023-06-15 NOTE — Plan of Care (Signed)
  Problem: Activity: Goal: Risk for activity intolerance will decrease Outcome: Progressing   Problem: Nutrition: Goal: Adequate nutrition will be maintained Outcome: Progressing   Problem: Pain Managment: Goal: General experience of comfort will improve Outcome: Progressing   Problem: Safety: Goal: Ability to remain free from injury will improve Outcome: Progressing   Problem: Skin Integrity: Goal: Risk for impaired skin integrity will decrease Outcome: Progressing   Problem: Metabolic: Goal: Ability to maintain appropriate glucose levels will improve Outcome: Progressing

## 2023-06-15 NOTE — Plan of Care (Signed)

## 2023-06-16 DIAGNOSIS — J9622 Acute and chronic respiratory failure with hypercapnia: Secondary | ICD-10-CM | POA: Diagnosis not present

## 2023-06-16 DIAGNOSIS — J9621 Acute and chronic respiratory failure with hypoxia: Secondary | ICD-10-CM | POA: Diagnosis not present

## 2023-06-16 DIAGNOSIS — J441 Chronic obstructive pulmonary disease with (acute) exacerbation: Secondary | ICD-10-CM | POA: Diagnosis not present

## 2023-06-16 LAB — BASIC METABOLIC PANEL
Anion gap: 9 (ref 5–15)
BUN: 25 mg/dL — ABNORMAL HIGH (ref 8–23)
CO2: 36 mmol/L — ABNORMAL HIGH (ref 22–32)
Calcium: 8.6 mg/dL — ABNORMAL LOW (ref 8.9–10.3)
Chloride: 93 mmol/L — ABNORMAL LOW (ref 98–111)
Creatinine, Ser: 0.98 mg/dL (ref 0.61–1.24)
GFR, Estimated: >60 mL/min (ref >60)
Glucose, Bld: 180 mg/dL — ABNORMAL HIGH (ref 70–99)
Potassium: 4.4 mmol/L (ref 3.5–5.1)
Sodium: 138 mmol/L (ref 135–145)

## 2023-06-16 LAB — GLUCOSE, CAPILLARY
Glucose-Capillary: 152 mg/dL — ABNORMAL HIGH (ref 70–99)
Glucose-Capillary: 284 mg/dL — ABNORMAL HIGH (ref 70–99)
Glucose-Capillary: 338 mg/dL — ABNORMAL HIGH (ref 70–99)
Glucose-Capillary: 259 mg/dL — ABNORMAL HIGH (ref 70–99)

## 2023-06-16 LAB — CBC
HCT: 40.2 % (ref 39.0–52.0)
Hemoglobin: 12.1 g/dL — ABNORMAL LOW (ref 13.0–17.0)
MCH: 28.3 pg (ref 26.0–34.0)
MCHC: 30.1 g/dL (ref 30.0–36.0)
MCV: 93.9 fL (ref 80.0–100.0)
Platelets: 228 10*3/uL (ref 150–400)
RBC: 4.28 MIL/uL (ref 4.22–5.81)
RDW: 12.7 % (ref 11.5–15.5)
WBC: 9 10*3/uL (ref 4.0–10.5)
nRBC: 0 % (ref 0.0–0.2)

## 2023-06-16 MED ORDER — INFLUENZA VAC A&B SURF ANT ADJ 0.5 ML IM SUSY
0.5000 mL | PREFILLED_SYRINGE | INTRAMUSCULAR | Status: AC
  Administered 2023-06-18: 0.5 mL via INTRAMUSCULAR
  Filled 2023-06-16: qty 0.5

## 2023-06-16 MED ORDER — METHYLPREDNISOLONE SODIUM SUCC 40 MG IJ SOLR
20.0000 mg | Freq: Every day | INTRAMUSCULAR | Status: DC
  Administered 2023-06-17 – 2023-06-18 (×2): 20 mg via INTRAVENOUS
  Filled 2023-06-16 (×2): qty 1

## 2023-06-16 NOTE — Progress Notes (Signed)
Mobility Specialist - Progress Note      06/16/23 1151  Mobility  Level of Assistance Contact guard assist, steadying assist  Assistive Device Front wheel walker  Range of Motion/Exercises Active  Mobility Referral Yes  $Mobility charge 1 Mobility  Mobility Specialist Stop Time (ACUTE ONLY) 1156   Pt resting in bed on 4L upon entry. Pt STS and ambulates to door CGA with RW. Pt endorses no SOB or wheeze during ambulation. Pt returned to recliner and a little impulsive and quickly sits down. Pt stands one more time at window for 2 minutes with RW. Pt left with needs in reach and chair alarm activated. Pt notified of pt request for shower chair.   Johnathan Hausen Mobility Specialist 06/16/23, 12:07 PM

## 2023-06-16 NOTE — Plan of Care (Signed)

## 2023-06-16 NOTE — Progress Notes (Addendum)
PROGRESS NOTE    Cody Alexander  ZOX:[id number] DOB: 25-Nov-1955 DOA: 06/12/2023 PCP: [facility]   Assessment & Plan:   Principal Problem:   Respiratory failure (HCC)  Assessment and Plan:  Acute diastolic CHF: will likely restart home bumex tomorrow. Monitor I/Os   COPD exacerbation: start steroid taper. Continue on bronchodilators & encourage incentive spirometry. 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 Uses BiPAP at bedtime. Continue on supplemental oxygen and back at baseline Uses 4-5L [place] chronically at home   DM2: likely poorly controlled. Continue on SSI w/ accuchecks   Peripheral neuropathy: continue on pregabalin   Morbid obesity: BMI 41.9. Complicates overall care & prognosis   HLD: continue on statin     DVT prophylaxis: heparin  Code Status: full  Family Communication:  Disposition Plan:  PT recs HH   Level of care: Progressive Status is: Inpatient Remains inpatient appropriate because: severity of illness    Consultants:    Procedures:   Antimicrobials:    Subjective: Pt c/o shortness of breath   Objective: Vitals:   06/15/23 2315 06/16/23 0205 06/16/23 0321 06/16/23 0803  BP: 128/73  127/80 129/85  Pulse: 84 74 81 80  Resp: 20  20   Temp: (!) 97.4 F (36.3 C)  98.7 F (37.1 C) 97.7 F (36.5 C)  TempSrc: Oral  Axillary   SpO2: 99% 100% 100% 96%  Weight:      Height:        Intake/Output Summary (Last 24 hours) at 06/16/2023 0833 Last data filed at 06/15/2023 2131 Gross per 24 hour  Intake 3 ml  Output 950 ml  Net -947 ml   Filed Weights   06/12/23 1842 06/13/23 0337 06/15/23 0343  Weight: (!) 141.6 kg (!) 140.2 kg (!) 140.3 kg    Examination:  General exam: Appears calm but uncomfortable. Morbidly obese Respiratory system: diminished breath sounds b/l  Cardiovascular system: S1/S2+. No rubs or  clicks Gastrointestinal system: abd is soft, NT, obese & hypoactive bowel sounds Central nervous system: alert & oriented. Moves all extremities   Psychiatry: judgement and insight appears at baseline. Flat mood and affect    Data Reviewed: I have personally reviewed following labs and imaging studies  CBC: Recent Labs  Lab 06/12/23 1122 06/12/23 1758 06/13/23 0434 06/14/23 0337 06/15/23 0338 06/16/23 0346  WBC 8.3 7.2 7.1 6.3 6.9 9.0  NEUTROABS 6.5  --   --   --   --   --   HGB 12.4* 12.8* 12.1* 12.6* 13.1 12.1*  HCT 42.7 44.0 40.0 41.1 42.3 40.2  MCV 97.7 96.7 94.8 92.8 91.8 93.9  PLT 232 225 210 215 242 228   Basic Metabolic Panel: Recent Labs  Lab 06/12/23 1122 06/12/23 1758 06/13/23 0434 06/14/23 0337 06/15/23 0338 06/16/23 0346  NA 140  --  139 137 135 138  K 4.7  --  3.5 3.4* 4.4 4.4  CL 90*  --  91* 95* 92* 93*  CO2 43*  --  36* 36* 35* 36*  GLUCOSE 198*  --  147* 134* 289* 180*  BUN 16  --  16 21 24* 25*  CREATININE 0.96 0.94 0.90 1.04 0.92 0.98  CALCIUM 8.6*  --  8.8* 8.3* 8.4* 8.6*  MG  --   --  2.2  --   --   --   PHOS  --   --  3.1  --   --   --  GFR: Estimated Creatinine Clearance: 107.7 mL/min (by C-G formula based on SCr of 0.98 mg/dL). Liver Function Tests: Recent Labs  Lab 06/12/23 1122  AST 21  ALT 25  ALKPHOS 58  BILITOT 1.0  PROT 7.3  ALBUMIN 3.6   No results for input(s): "LIPASE", "AMYLASE" in the last 168 hours. No results for input(s): "AMMONIA" in the last 168 hours. Coagulation Profile: No results for input(s): "INR", "PROTIME" in the last 168 hours. Cardiac Enzymes: No results for input(s): "CKTOTAL", "CKMB", "CKMBINDEX", "TROPONINI" in the last 168 hours. BNP (last 3 results) No results for input(s): "PROBNP" in the last 8760 hours. HbA1C: No results for input(s): "HGBA1C" in the last 72 hours. CBG: Recent Labs  Lab 06/14/23 2002 06/15/23 0924 06/15/23 1219 06/15/23 1645 06/15/23 2039  GLUCAP 189* 176* 172*  297* 322*   Lipid Profile: No results for input(s): "CHOL", "HDL", "LDLCALC", "TRIG", "CHOLHDL", "LDLDIRECT" in the last 72 hours. Thyroid Function Tests: No results for input(s): "TSH", "T4TOTAL", "FREET4", "T3FREE", "THYROIDAB" in the last 72 hours. Anemia Panel: No results for input(s): "VITAMINB12", "FOLATE", "FERRITIN", "TIBC", "IRON", "RETICCTPCT" in the last 72 hours. Sepsis Labs: No results for input(s): "PROCALCITON", "LATICACIDVEN" in the last 168 hours.  No results found for this or any previous visit (from the past 240 hour(s)).       Radiology Studies: No results found.      Scheduled Meds:  aspirin EC  81 mg Oral Daily   atorvastatin  40 mg Oral Daily   heparin  5,000 Units Subcutaneous Q8H   hydrocortisone cream   Topical TID   insulin aspart  0-15 Units Subcutaneous TID AC & HS   methylPREDNISolone (SOLU-MEDROL) injection  40 mg Intravenous Daily   mouth rinse  15 mL Mouth Rinse 4 times per day   pregabalin  75 mg Oral TID   sodium chloride flush  3 mL Intravenous Q12H   Continuous Infusions:   LOS: 4 days       Charise Killian, MD [facility] Pager [phone]  If 7PM-7AM, please contact night-coverage [email or web address] 06/16/2023, 8:33 AM

## 2023-06-16 NOTE — Care Management Important Message (Signed)
Important Message  Patient Details  Name: Cody Alexander MRN: [id number] Date of Birth: 10-11-1955   Important Message Given:  Yes - Medicare IM     Darolyn Rua, LCSW 06/16/2023, 9:34 AM

## 2023-06-16 NOTE — TOC Progression Note (Addendum)
Transition of Care [facility]) - Progression Note    Patient Details  Name: Cody Alexander MRN: [id number] Date of Birth: 08/09/1956  Transition of [facility]) CM/SW Contact  Darolyn Rua, [place] Phone Number: 06/16/2023, 10:00 AM  Clinical Narrative:     10/21: TOC continue to follow for dc planning needs,. Plan to dc home with [facility] once medically stable, potentially tomorrow. CSW met with patient at bedside to confirm plan, he reports he will work on finding a ride today. If not, will need uber/taxi tomorrow and will need to order o2 from [facility]. Reports CSW can check in with him later this afternoon to see if he identified a ride. No additional dc needs.     10/18: CSW met with patient. No supports at bedside. CSW introduced role and inquired about his home bipap mask being broken. Patient stated he gets his bipap supplies from [facility] and he has obtained a new mask. Patient lives with his roommate and her family. He wants to look for another place to live. CSW explained that we cannot assist with this but can provide resources. Resources added to AVS. Patient requested chaplain consult. RN is aware. No further concerns. CSW encouraged patient to contact CSW as needed. CSW will continue to follow patient for support and facilitate return home once stable.                5:04 pm: Per [facility], patient is active with nursing only.   Expected Discharge Plan: Home/Self Care Barriers to Discharge: Continued Medical Work up  Expected Discharge Plan and Services     Post Acute Care Choice: NA Living arrangements for the past 2 months: Single Family Home                                       Social Determinants of Health (SDOH) Interventions SDOH Screenings   Food Insecurity: No Food Insecurity (02/19/2023)   Received from [facility]  Housing: High Risk (01/15/2023)  Transportation Needs: No Transportation Needs  (02/19/2023)   Received from [facility]  Utilities: Patient Unable To Answer (01/15/2023)  Financial Resource Strain: Medium Risk (02/19/2023)   Received from [facility]  Physical Activity: Inactive (07/02/2021)   Received from [facility], [facility]  Social Connections: Moderately Isolated (10/21/2022)   Received from [facility], [facility]  Stress: Stress Concern Present (10/21/2022)   Received from [facility], [facility]  Tobacco Use: Medium Risk (06/12/2023)  Health Literacy: Medium Risk (10/21/2022)   Received from [facility], [facility]    Readmission Risk Interventions    06/25/2022   10:13 AM  Readmission Risk Prevention Plan  Transportation Screening Complete  PCP or Specialist Appt within 3-5 Days Complete  HRI or Home Care Consult Complete  Social Work Consult for Recovery Care Planning/Counseling Complete  Palliative Care Screening Not Applicable  Medication Review Oceanographer) Complete

## 2023-06-16 NOTE — Inpatient Diabetes Management (Signed)
Inpatient Diabetes Program Recommendations  AACE/ADA: New Consensus Statement on Inpatient Glycemic Control (2015)  Target Ranges:  Prepandial:   less than 140 mg/dL      Peak postprandial:   less than 180 mg/dL (1-2 hours)      Critically ill patients:  140 - 180 mg/dL   Lab Results  Component Value Date   GLUCAP 152 (H) 06/16/2023   HGBA1C 9.4 (H) 01/08/2023    Review of Glycemic Control  Latest Reference Range & Units 06/15/23 09:24 06/15/23 12:19 06/15/23 16:45 06/15/23 20:39 06/16/23 08:05  Glucose-Capillary 70 - 99 mg/dL 284 (H) 132 (H) 440 (H) 322 (H) 152 (H)  (H): Data is abnormally high  Diabetes history: DM2 Outpatient Diabetes medications: Lantus 20 units every day, Humalog 10 units TID, Metformin 1000 mg QD Current orders for Inpatient glycemic control: Novolog 0-15 units TID and HS, Solumedrol 20 mg QD  Inpatient Diabetes Program Recommendations:    Please consider:  Novolog 5 units TID with meals if he consumes at least 50%.  Will continue to follow while inpatient.  Thank you, Dulce Sellar, MSN, CDCES Diabetes Coordinator Inpatient Diabetes Program [phone] (team pager from 8a-5p)

## 2023-06-17 ENCOUNTER — Inpatient Hospital Stay: Payer: 59

## 2023-06-17 DIAGNOSIS — J9622 Acute and chronic respiratory failure with hypercapnia: Secondary | ICD-10-CM | POA: Diagnosis not present

## 2023-06-17 DIAGNOSIS — J441 Chronic obstructive pulmonary disease with (acute) exacerbation: Secondary | ICD-10-CM | POA: Diagnosis not present

## 2023-06-17 DIAGNOSIS — J9621 Acute and chronic respiratory failure with hypoxia: Secondary | ICD-10-CM | POA: Diagnosis not present

## 2023-06-17 LAB — BASIC METABOLIC PANEL
Anion gap: 5 (ref 5–15)
BUN: 22 mg/dL (ref 8–23)
CO2: 41 mmol/L — ABNORMAL HIGH (ref 22–32)
Calcium: 8.6 mg/dL — ABNORMAL LOW (ref 8.9–10.3)
Chloride: 92 mmol/L — ABNORMAL LOW (ref 98–111)
Creatinine, Ser: 0.93 mg/dL (ref 0.61–1.24)
GFR, Estimated: >60 mL/min (ref >60)
Glucose, Bld: 201 mg/dL — ABNORMAL HIGH (ref 70–99)
Potassium: 4.5 mmol/L (ref 3.5–5.1)
Sodium: 138 mmol/L (ref 135–145)

## 2023-06-17 LAB — CBC
HCT: 43.9 % (ref 39.0–52.0)
Hemoglobin: 13 g/dL (ref 13.0–17.0)
MCH: 28.4 pg (ref 26.0–34.0)
MCHC: 29.6 g/dL — ABNORMAL LOW (ref 30.0–36.0)
MCV: 96.1 fL (ref 80.0–100.0)
Platelets: 233 10*3/uL (ref 150–400)
RBC: 4.57 MIL/uL (ref 4.22–5.81)
RDW: 12.9 % (ref 11.5–15.5)
WBC: 6.4 10*3/uL (ref 4.0–10.5)
nRBC: 0 % (ref 0.0–0.2)

## 2023-06-17 LAB — GLUCOSE, CAPILLARY
Glucose-Capillary: 146 mg/dL — ABNORMAL HIGH (ref 70–99)
Glucose-Capillary: 243 mg/dL — ABNORMAL HIGH (ref 70–99)
Glucose-Capillary: 379 mg/dL — ABNORMAL HIGH (ref 70–99)
Glucose-Capillary: 352 mg/dL — ABNORMAL HIGH (ref 70–99)
Glucose-Capillary: 295 mg/dL — ABNORMAL HIGH (ref 70–99)

## 2023-06-17 MED ORDER — BUMETANIDE 1 MG PO TABS
2.0000 mg | ORAL_TABLET | Freq: Every day | ORAL | Status: DC
  Administered 2023-06-17 – 2023-06-19 (×3): 2 mg via ORAL
  Filled 2023-06-17 (×3): qty 2

## 2023-06-17 MED ORDER — PREGABALIN 50 MG PO CAPS
50.0000 mg | ORAL_CAPSULE | Freq: Three times a day (TID) | ORAL | Status: DC
  Administered 2023-06-17 – 2023-06-19 (×5): 50 mg via ORAL
  Filled 2023-06-17 (×5): qty 1

## 2023-06-17 MED ORDER — OXYCODONE-ACETAMINOPHEN 5-325 MG PO TABS
1.0000 | ORAL_TABLET | ORAL | Status: DC | PRN
  Administered 2023-06-17 – 2023-06-18 (×4): 1 via ORAL
  Filled 2023-06-17 (×4): qty 1

## 2023-06-17 NOTE — Progress Notes (Signed)
PROGRESS NOTE   HPI was taken from Dr. Belia Heman: 67 year old male with history of CHF, COPD, CAD, HTN, T2DM, OSA presenting to the Emergency Department for evaluation of chest pain and shortness of breath.     Patient reports that for the past 3 days he has had worsening chest pain and shortness of breath as well as lower extremity edema.     States he recently started taking Ozempic and that has caused some GI upset so he has not been taking his medications regularly.    Specifically has not been adherent with his diuresis.   reports that CPAP mask is broken so he has not been wearing that at night.   Normally wears 3 to 4 L of oxygen.  With EMS was found to be 85% on 3 L.  Received 4 baby aspirin and 1 spray of nitroglycerin    Placed on biPAP Severe resp acidosis despite 2 hrs of biPAP   Mayford Beckstrand  QQV:[id number] DOB: 07/26/1956 DOA: 06/12/2023 PCP: [facility]   Assessment & Plan:   Principal Problem:   Respiratory failure (HCC)  Assessment and Plan:  Acute diastolic CHF: restarted home bumex. Monitor I/Os  COPD exacerbation: continue on steroid taper. Continue on bronchodilators & encourage incentive spirometry. Continue on supplemental oxygen.   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 Uses BiPAP at bedtime. Continue on supplemental oxygen and back at baseline Uses 4-5L [place] chronically at home   DM2: likely poorly controlled. Continue on SSI w/ accuchecks   Peripheral neuropathy: decreased dose of pregabalin as pt was very drowsy today. Reduced pain meds as well today   Morbid obesity: BMI 41.9. Complicates overall care & prognosis   HLD: continue on statin  Generalized weakness: PT initially recommended HH but PT now recs SNF. Pt is currently refusing SNF. Pt is unable to ambulate independently currently and lives w/ 61 lady who unable to assist pt to ambulate or do ADLs      DVT prophylaxis: heparin  Code Status: full  Family Communication:  Disposition Plan:  PT/OT recs SNF. Pt is currently refusing SNF   Level of care: Progressive Status is: Inpatient Remains inpatient appropriate because: severity of illness    Consultants:    Procedures:   Antimicrobials:    Subjective: Pt is lethargic   Objective: Vitals:   06/16/23 2331 06/17/23 0414 06/17/23 0857 06/17/23 1348  BP: (!) 143/92 (!) 141/97 (!) 152/102 111/72  Pulse: 80 89 84 95  Resp: 18 19 16 16   Temp: 97.6 F (36.4 C) 98.6 F (37 C) 98.1 F (36.7 C) 97.8 F (36.6 C)  TempSrc: Oral     SpO2: 100% 95% 92% 91%  Weight:      Height:        Intake/Output Summary (Last 24 hours) at 06/17/2023 1448 Last data filed at 06/17/2023 1100 Gross per 24 hour  Intake 740 ml  Output 1150 ml  Net -410 ml   Filed Weights   06/12/23 1842 06/13/23 0337 06/15/23 0343  Weight: (!) 141.6 kg (!) 140.2 kg (!) 140.3 kg    Examination:  General exam: appears lethargic. Morbidly obese Respiratory system: decreased breath sounds b/l  Cardiovascular system: S1/S2+. No rubs or clicks  Gastrointestinal system: abd is soft, NT, obese & hypoactive bowel sounds  Central nervous system: lethargic. Moves all extremities  Psychiatry: judgement and insight appears at baseline. Flat mood and affect  Data Reviewed: I have personally reviewed following labs and imaging studies  CBC: Recent Labs  Lab 06/12/23 1122 06/12/23 1758 06/13/23 0434 06/14/23 0337 06/15/23 0338 06/16/23 0346 06/17/23 0501  WBC 8.3   < > 7.1 6.3 6.9 9.0 6.4  NEUTROABS 6.5  --   --   --   --   --   --   HGB 12.4*   < > 12.1* 12.6* 13.1 12.1* 13.0  HCT 42.7   < > 40.0 41.1 42.3 40.2 43.9  MCV 97.7   < > 94.8 92.8 91.8 93.9 96.1  PLT 232   < > 210 215 242 228 233   < > = values in this interval not displayed.   Basic Metabolic Panel: Recent Labs  Lab 06/13/23 0434 06/14/23 0337 06/15/23 0338 06/16/23 0346  06/17/23 0501  NA 139 137 135 138 138  K 3.5 3.4* 4.4 4.4 4.5  CL 91* 95* 92* 93* 92*  CO2 36* 36* 35* 36* 41*  GLUCOSE 147* 134* 289* 180* 201*  BUN 16 21 24* 25* 22  CREATININE 0.90 1.04 0.92 0.98 0.93  CALCIUM 8.8* 8.3* 8.4* 8.6* 8.6*  MG 2.2  --   --   --   --   PHOS 3.1  --   --   --   --    GFR: Estimated Creatinine Clearance: 113.5 mL/min (by C-G formula based on SCr of 0.93 mg/dL). Liver Function Tests: Recent Labs  Lab 06/12/23 1122  AST 21  ALT 25  ALKPHOS 58  BILITOT 1.0  PROT 7.3  ALBUMIN 3.6   No results for input(s): "LIPASE", "AMYLASE" in the last 168 hours. No results for input(s): "AMMONIA" in the last 168 hours. Coagulation Profile: No results for input(s): "INR", "PROTIME" in the last 168 hours. Cardiac Enzymes: No results for input(s): "CKTOTAL", "CKMB", "CKMBINDEX", "TROPONINI" in the last 168 hours. BNP (last 3 results) No results for input(s): "PROBNP" in the last 8760 hours. HbA1C: No results for input(s): "HGBA1C" in the last 72 hours. CBG: Recent Labs  Lab 06/16/23 1252 06/16/23 1700 06/16/23 2040 06/17/23 0851 06/17/23 1249  GLUCAP 284* 338* 259* 146* 243*   Lipid Profile: No results for input(s): "CHOL", "HDL", "LDLCALC", "TRIG", "CHOLHDL", "LDLDIRECT" in the last 72 hours. Thyroid Function Tests: No results for input(s): "TSH", "T4TOTAL", "FREET4", "T3FREE", "THYROIDAB" in the last 72 hours. Anemia Panel: No results for input(s): "VITAMINB12", "FOLATE", "FERRITIN", "TIBC", "IRON", "RETICCTPCT" in the last 72 hours. Sepsis Labs: No results for input(s): "PROCALCITON", "LATICACIDVEN" in the last 168 hours.  No results found for this or any previous visit (from the past 240 hour(s)).       Radiology Studies: DG Chest Port 1 View  Result Date: 06/17/2023 CLINICAL DATA:  Dyspnea. EXAM: PORTABLE CHEST 1 VIEW COMPARISON:  June 12, 2023. FINDINGS: Stable cardiomegaly. Right lung is clear. Mild left lingular subsegmental  atelectasis or scarring is noted. Right lung is clear. Bony thorax is unremarkable. IMPRESSION: Mild left lingular subsegmental atelectasis or scarring is noted. Electronically Signed   By: Lupita Raider M.D.   On: 06/17/2023 10:45        Scheduled Meds:  aspirin EC  81 mg Oral Daily   atorvastatin  40 mg Oral Daily   bumetanide  2 mg Oral Daily   heparin  5,000 Units Subcutaneous Q8H   hydrocortisone cream   Topical TID   influenza vaccine adjuvanted  0.5 mL Intramuscular Tomorrow-1000   insulin aspart  0-15 Units Subcutaneous  TID AC & HS   methylPREDNISolone (SOLU-MEDROL) injection  20 mg Intravenous Daily   mouth rinse  15 mL Mouth Rinse 4 times per day   pregabalin  50 mg Oral TID   sodium chloride flush  3 mL Intravenous Q12H   Continuous Infusions:   LOS: 5 days       Charise Killian, MD [facility] Pager [phone]  If 7PM-7AM, please contact night-coverage [email or web address] 06/17/2023, 2:48 PM

## 2023-06-17 NOTE — Plan of Care (Signed)
  Problem: Education: Goal: Knowledge of General Education information will improve Description: Including pain rating scale, medication(s)/side effects and non-pharmacologic comfort measures Outcome: Progressing   Problem: Health Behavior/Discharge Planning: Goal: Ability to manage health-related needs will improve Outcome: Progressing   Problem: Clinical Measurements: Goal: Ability to maintain clinical measurements within normal limits will improve Outcome: Progressing Goal: Will remain free from infection Outcome: Progressing Goal: Diagnostic test results will improve Outcome: Progressing Goal: Respiratory complications will improve Outcome: Progressing Goal: Cardiovascular complication will be avoided Outcome: Progressing   Problem: Activity: Goal: Risk for activity intolerance will decrease Outcome: Progressing   Problem: Nutrition: Goal: Adequate nutrition will be maintained Outcome: Progressing   Problem: Coping: Goal: Level of anxiety will decrease Outcome: Progressing   Problem: Elimination: Goal: Will not experience complications related to bowel motility Outcome: Progressing Goal: Will not experience complications related to urinary retention Outcome: Progressing   Problem: Pain Managment: Goal: General experience of comfort will improve Outcome: Progressing   Problem: Safety: Goal: Ability to remain free from injury will improve Outcome: Progressing   Problem: Skin Integrity: Goal: Risk for impaired skin integrity will decrease Outcome: Progressing   Problem: Fluid Volume: Goal: Ability to maintain a balanced intake and output will improve Outcome: Progressing   Problem: Coping: Goal: Ability to adjust to condition or change in health will improve Outcome: Progressing   Problem: Health Behavior/Discharge Planning: Goal: Ability to identify and utilize available resources and services will improve Outcome: Progressing Goal: Ability to manage  health-related needs will improve Outcome: Progressing   Problem: Skin Integrity: Goal: Risk for impaired skin integrity will decrease Outcome: Progressing   Problem: Nutritional: Goal: Maintenance of adequate nutrition will improve Outcome: Progressing Goal: Progress toward achieving an optimal weight will improve Outcome: Progressing   Problem: Tissue Perfusion: Goal: Adequacy of tissue perfusion will improve Outcome: Progressing   Problem: Metabolic: Goal: Ability to maintain appropriate glucose levels will improve Outcome: Progressing

## 2023-06-17 NOTE — TOC Progression Note (Signed)
Transition of Care [facility]) - Progression Note    Patient Details  Name: Cody Alexander MRN: [id number] Date of Birth: 1956-02-17  Transition of Care [facility]) CM/SW Contact  Darolyn Rua, [place] Phone Number: 06/17/2023, 11:10 AM  Clinical Narrative:     CSW met with patient at bedside to confirm dc plan for home with [facility] potentially today, CSW met to confirm he has a ride. However patient reports he's not feeling well and can hardly move, reports he is not ready to dc today.   CSW has updated MD and RN on above.  Expected Discharge Plan: Home/Self Care Barriers to Discharge: Continued Medical Work up  Expected Discharge Plan and Services     Post Acute Care Choice: NA Living arrangements for the past 2 months: Single Family Home                                       Social Determinants of Health (SDOH) Interventions SDOH Screenings   Food Insecurity: No Food Insecurity (06/16/2023)  Housing: Low Risk  (06/16/2023)  Transportation Needs: No Transportation Needs (06/16/2023)  Utilities: Not At Risk (06/16/2023)  Financial Resource Strain: Medium Risk (02/19/2023)   Received from [facility]  Physical Activity: Inactive (07/02/2021)   [facility] from [facility]  Social Connections: Moderately Isolated (10/21/2022)   [facility] from [facility], [facility]  Stress: Stress Concern Present (10/21/2022)   Received from [facility], [facility]  Tobacco Use: Medium Risk (06/12/2023)  Health Literacy: Medium Risk (10/21/2022)   Received from [facility], [facility]    Readmission Risk Interventions    06/25/2022   10:13 AM  Readmission Risk Prevention Plan  Transportation Screening Complete  PCP or Specialist Appt within 3-5 Days Complete  HRI or Home Care Consult Complete  Social Work Consult for Recovery Care Planning/Counseling Complete  Palliative Care Screening Not Applicable  Medication Review Special educational needs teacher) Complete

## 2023-06-17 NOTE — Evaluation (Signed)
Clinical/Bedside Swallow Evaluation Patient Details  Name: Cody Alexander MRN: [id number] Date of Birth: July 16, 1956  Today's Date: 06/17/2023 Time: SLP Start Time (ACUTE ONLY): 1305 SLP Stop Time (ACUTE ONLY): 1400 SLP Time Calculation (min) (ACUTE ONLY): 55 min  Past Medical History:  Past Medical History:  Diagnosis Date   CHF (congestive heart failure) (HCC)    COPD (chronic obstructive pulmonary disease) (HCC)    Diabetes mellitus without complication (HCC)    Hypertension    Neuropathy    Past Surgical History:  Past Surgical History:  Procedure Laterality Date   LEFT HEART CATH AND CORONARY ANGIOGRAPHY Right 02/27/2018   Procedure: LEFT HEART CATH AND CORONARY ANGIOGRAPHY;  Surgeon: Laurier Nancy, MD;  Location: [facility] INVASIVE CV LAB;  Service: Cardiovascular;  Laterality: Right;   HPI:  Pt is a 67 year old male presenting with SOB and chest pain, admitted with COPD exacerbation, acute diastolic CHF, 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    CXR: Mild left lingular subsegmental atelectasis or scarring is noted.    Assessment / Plan / Recommendation  Clinical Impression   Pt seen for BSE today. Pt drowsy, sleepy. Required verbal/tactile cues to maintain alertness during evaluation. Pt w/ mumbled speech at times; eyes closed often. NSG reported pt is taking Pain meds which may be having a sedating effect. NSG staff stated falling alseep during meals - this was noted during BSE. MOD+ support w/ self-feeding. O2 readjusted after falling out of nose -- O2 sats 73% when checked; 92%% w/ O2 replaced.  On [place] O2 support 5L; afebrile. WBC WNL.  Pt present w/ concern for oropharyngeal phase swallowing deficits in setting of MOD++ drowsiness/lethargy impacting his overall awareness of self and hampering his participation in the BSE overall. No grossl oropharyngeal phase dysphagia noted w/ a modified diet  consistency; no overt neuromuscular deficits noted. Pt consumed the po trials presented this evaluation w/ No overt, clinical s/s of aspiration during intake.  Pt appears at reduced risk for aspiration following general aspiration precautions w/ a modified diet consistency. However, pt does have challenging factors that could impact oropharyngeal swallowing to include acute illness/hospitalization, weakness, Pulmonary issues baseline, and lethargy. Pt also required full feeding assistance d/t lethargy. These factors can increase risk for aspiration, dysphagia as well as decreased oral intake overall.   During po trials, pt consumed trials of Nectar liquids, purees, and softened solid food consistencies w/ no overt coughing, decline in vocal quality, or change in respiratory presentation during/post trials. O2 sats 92-93% when checked. Oral phase appeared grossly [facility] w/ functional bolus management, mastication, and control of bolus propulsion for A-P transfer for swallowing. Given verbal/tactile cues intermittently, pt maintained attention to the food consistency trials to complete mastication and swallowing. Oral clearing achieved w/ all trial consistencies w/ Time given -- moistened, softened foods given.  OM Exam was curosry but appeared [facility] w/ no unilateral weakness noted during bolus management. Speech intelligible, mumbled. Pt required full feeding support.   Recommend a more mech soft consistency diet w/ well-moistened foods; Nectar liquids -- carefully monitor straw use, and pt should help to Hold Cup when drinking. Recommend general aspiration precautions, feeding support and supervision w/ all oral intake. Pt MUST be awake/alert for any po's, meals. Reduce distractions during meals. Pills CRUSHED vs WHOLE in Puree for safer, easier swallowing.  Education given on Pills in Puree; food consistencies and easy to eat options;  general aspiration precautions to pt and NSG. ST services will continue to  follow pt during this admit for toleration of diet and trials to upgrade diet consistency as appropriate.  Discussed pt's presentation w/ MD who is adjusting his medications - current medications may be having a sedating effect on pt. NSG updated, agreed. Recommend Dietician f/u for support. SLP Visit Diagnosis: Dysphagia, unspecified (R13.10) (State decline)    Aspiration Risk  Mild aspiration risk;Risk for inadequate nutrition/hydration    Diet Recommendation   Nectar;Dysphagia 3 (mechanical soft)  Medication Administration: Whole meds with puree (vs Crushed)    Other  Recommendations Recommended Consults:  (Dietician f/u) Oral Care Recommendations: Oral care BID;Oral care before and after PO;Staff/trained caregiver to provide oral care Caregiver Recommendations: Avoid jello, ice cream, thin soups, popsicles;Remove water pitcher;Have oral suction available    Recommendations for follow up therapy are one component of a multi-disciplinary discharge planning process, led by the attending physician.  Recommendations may be updated based on patient status, additional functional criteria and insurance authorization.  Follow up Recommendations Follow physician's recommendations for discharge plan and follow up therapies (TBD)      Assistance Recommended at Discharge    Functional Status Assessment Patient has had a recent decline in their functional status and demonstrates the ability to make significant improvements in function in a reasonable and predictable amount of time.  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    CXR: Mild left lingular subsegmental atelectasis or scarring is noted. Type of Study: Bedside Swallow Evaluation Previous Swallow Assessment: 12/2022 - mech soft, thins Diet Prior to this Study: Regular;Thin liquids (Level 0) Temperature Spikes Noted: No (wbc 6.4) Respiratory Status: Nasal cannula (4L) History of Recent Intubation: No Behavior/Cognition: Cooperative;Pleasant mood;Confused;Lethargic/Drowsy;Distractible;Requires cueing Oral Cavity Assessment: Dry Oral Care Completed by SLP: Yes Oral Cavity - Dentition: Missing dentition (anterior Dentition; missing molars) Vision: Functional for self-feeding Self-Feeding Abilities: Needs assist;Total assist;Needs set up (drowsy) Patient Positioning: Upright in chair (supported head forward) Baseline Vocal Quality: Normal (mumbled) Volitional Cough: Cognitively unable to elicit Volitional Swallow: Unable to elicit    Oral/Motor/Sensory Function Overall Oral Motor/Sensory Function: Within functional limits (no unilateral weakness noted)   Ice Chips Ice chips: Not tested   Thin Liquid Thin Liquid: Not tested    Nectar Thick Nectar Thick Liquid: Within functional limits Presentation: Spoon;Straw (fed; ~3 ozs total) Other Comments: assessed d/t State decline   Honey Thick Honey Thick Liquid: Not tested   Puree Puree: Within functional limits Presentation: Spoon (fed; ~3 ozs) Other Comments: assessed d/t State decline   Solid     Solid: Impaired Presentation: Spoon (fed; 8 trials) Oral Phase Impairments: Poor awareness of bolus;Impaired mastication (Time needed) Oral Phase Functional Implications: Impaired mastication (Time) Pharyngeal Phase Impairments:  (none)         Jerilynn Som, MS, CCC-SLP Speech Language Pathologist Rehab Services; [facility] [phone] (ascom) Ozzie Remmers 06/17/2023,5:26 PM

## 2023-06-17 NOTE — Inpatient Diabetes Management (Addendum)
Inpatient Diabetes Program Recommendations  AACE/ADA: New Consensus Statement on Inpatient Glycemic Control (2015)  Target Ranges:  Prepandial:   less than 140 mg/dL      Peak postprandial:   less than 180 mg/dL (1-2 hours)      Critically ill patients:  140 - 180 mg/dL   Lab Results  Component Value Date   GLUCAP 146 (H) 06/17/2023   HGBA1C 9.4 (H) 01/08/2023    Review of Glycemic Control  Diabetes history: DM2 Outpatient Diabetes medications: Lantus 20 units every day, Humalog 10 units TID, Metformin 1000 mg QD Current orders for Inpatient glycemic control: Novolog 0-15 units TID and HS, Solumedrol 20 mg QD   Inpatient Diabetes Program Recommendations:     Please consider:   Novolog 4 units TID with meals if he consumes at least 50%.  Addendum@1210 .  No noon CBG yet in chart.  He has his tray but has spilled his tea and most of his food is on the floor.  He has had some pain medicine around 10am.  Will attempt to speak with him tomorrow regarding DM management.  Will continue to follow while inpatient.  Thank you, Dulce Sellar, MSN, CDCES Diabetes Coordinator Inpatient Diabetes Program [phone] (team pager from 8a-5p)

## 2023-06-17 NOTE — Progress Notes (Signed)
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  Pt able to perform multiple sit to/from stands from recliner with on CGA but each time was only able to stand 5-10 sec before completely and with no warning buckling back to the chair with no noted effort to slow descent.  During training max cues given for positioning within the walker to use BUEs to assist to prevent buckling but with no improvement. Attempted sit to/from stand training using a Steady Lift but pt unable to come to standing using the device even with max A.  Pt is a very high risk for falls with nursing educated on need for hoyer lift for transfers at this time.  Pt will benefit from continued PT services upon discharge to safely address deficits listed in patient problem list for decreased caregiver assistance and eventual return to PLOF.     If plan is discharge home, recommend the following: Help with stairs or ramp for entrance;Assist for transportation;Direct supervision/assist for financial management;Assistance with cooking/housework;Two people to help with walking and/or transfers;A lot of help with bathing/dressing/bathroom   Can travel by private vehicle     No  Equipment Recommendations  Other (comment) (TBD at next venue of care)    Recommendations for Other Services       Precautions / Restrictions Precautions Precautions: Fall Restrictions Weight Bearing Restrictions: No Other Position/Activity Restrictions: Careful, buckles completely with no warning      Mobility  Bed Mobility               General bed mobility comments: NT, pt in recliner    Transfers Overall transfer level: Needs assistance Equipment used: Rolling walker (2 wheels) Transfers: Sit to/from Stand Sit to Stand: Contact guard assist, +2 safety/equipment           General transfer comment: Mod to max multi-modal cues for sequencing but no physical assist needed to come to standing; stood multiple times with each stand ending in a total buckle back to the chair with no noted effort on pt's part to slow decent    Ambulation/Gait               General Gait Details: Unable/unsafe to attempt   Stairs             Wheelchair Mobility     Tilt Bed    Modified Rankin (Stroke Patients Only)       Balance                                            Cognition Arousal: Alert Behavior During Therapy: WFL for tasks assessed/performed Overall Cognitive Status: No family/caregiver present to determine baseline cognitive functioning                                 General Comments: Needed extra time and cuing to follow commands  Exercises Total Joint Exercises Long Arc Quad: AROM, Strengthening, Both, 10 reps Knee Flexion: AROM, Strengthening, Both, 10 reps Other Exercises Other Exercises: Anterior weight shifting in sitting to facilitate transfers    General Comments        Pertinent Vitals/Pain Pain Assessment Pain Assessment: No/denies pain    Home Living                          Prior Function            PT Goals (current goals can now be found in the care plan section) Progress towards PT goals: Not progressing toward goals - comment (limited by decline in functional strength)    Frequency    Min 1X/week      PT Plan      Co-evaluation              AM-PAC PT "6 Clicks" Mobility   Outcome Measure  Help needed turning from your back to your side while  in a flat bed without using bedrails?: A Little Help needed moving from lying on your back to sitting on the side of a flat bed without using bedrails?: A Little Help needed moving to and from a bed to a chair (including a wheelchair)?: Total Help needed standing up from a chair using your arms (e.g., wheelchair or bedside chair)?: A Little Help needed to walk in hospital room?: Total Help needed climbing 3-5 steps with a railing? : Total 6 Click Score: 12    End of Session Equipment Utilized During Treatment: Gait belt Activity Tolerance: Patient tolerated treatment well Patient left: in chair;with call bell/phone within reach;with chair alarm set Nurse Communication: Mobility status;Need for lift equipment;Other (comment) (Pt with significant BUE tremors) PT Visit Diagnosis: Repeated falls (R29.6);Difficulty in walking, not elsewhere classified (R26.2);Other abnormalities of gait and mobility (R26.89)     Time: 1610-9604 PT Time Calculation (min) (ACUTE ONLY): 38 min  Charges:    $Therapeutic Activity: 23-37 mins PT General Charges $$ ACUTE PT VISIT: 1 Visit                     D. Scott Khalon Cansler PT, DPT 06/17/23, 12:10 PM

## 2023-06-18 ENCOUNTER — Encounter: Payer: 59 | Admitting: Family

## 2023-06-18 DIAGNOSIS — J9621 Acute and chronic respiratory failure with hypoxia: Secondary | ICD-10-CM | POA: Diagnosis not present

## 2023-06-18 DIAGNOSIS — J9622 Acute and chronic respiratory failure with hypercapnia: Secondary | ICD-10-CM | POA: Diagnosis not present

## 2023-06-18 LAB — GLUCOSE, CAPILLARY
Glucose-Capillary: 235 mg/dL — ABNORMAL HIGH (ref 70–99)
Glucose-Capillary: 272 mg/dL — ABNORMAL HIGH (ref 70–99)
Glucose-Capillary: 359 mg/dL — ABNORMAL HIGH (ref 70–99)
Glucose-Capillary: 248 mg/dL — ABNORMAL HIGH (ref 70–99)

## 2023-06-18 LAB — CBC
HCT: 42.5 % (ref 39.0–52.0)
Hemoglobin: 12.5 g/dL — ABNORMAL LOW (ref 13.0–17.0)
MCH: 28.2 pg (ref 26.0–34.0)
MCHC: 29.4 g/dL — ABNORMAL LOW (ref 30.0–36.0)
MCV: 95.7 fL (ref 80.0–100.0)
Platelets: 217 10*3/uL (ref 150–400)
RBC: 4.44 MIL/uL (ref 4.22–5.81)
RDW: 12.8 % (ref 11.5–15.5)
WBC: 6.5 10*3/uL (ref 4.0–10.5)
nRBC: 0 % (ref 0.0–0.2)

## 2023-06-18 LAB — BASIC METABOLIC PANEL
Anion gap: 7 (ref 5–15)
BUN: 25 mg/dL — ABNORMAL HIGH (ref 8–23)
CO2: 44 mmol/L — ABNORMAL HIGH (ref 22–32)
Calcium: 8.6 mg/dL — ABNORMAL LOW (ref 8.9–10.3)
Chloride: 91 mmol/L — ABNORMAL LOW (ref 98–111)
Creatinine, Ser: 0.91 mg/dL (ref 0.61–1.24)
GFR, Estimated: >60 mL/min (ref >60)
Glucose, Bld: 151 mg/dL — ABNORMAL HIGH (ref 70–99)
Potassium: 4.8 mmol/L (ref 3.5–5.1)
Sodium: 142 mmol/L (ref 135–145)

## 2023-06-18 MED ORDER — METOPROLOL SUCCINATE ER 25 MG PO TB24
25.0000 mg | ORAL_TABLET | Freq: Every day | ORAL | Status: DC
  Administered 2023-06-19: 25 mg via ORAL
  Filled 2023-06-18: qty 1

## 2023-06-18 MED ORDER — INSULIN ASPART 100 UNIT/ML IJ SOLN
4.0000 [IU] | Freq: Three times a day (TID) | INTRAMUSCULAR | Status: DC
  Administered 2023-06-18 – 2023-06-19 (×3): 4 [IU] via SUBCUTANEOUS
  Filled 2023-06-18 (×2): qty 1

## 2023-06-18 MED ORDER — INSULIN GLARGINE-YFGN 100 UNIT/ML ~~LOC~~ SOLN
10.0000 [IU] | Freq: Every day | SUBCUTANEOUS | Status: DC
  Administered 2023-06-18 – 2023-06-19 (×2): 10 [IU] via SUBCUTANEOUS
  Filled 2023-06-18 (×2): qty 0.1

## 2023-06-18 MED ORDER — ENOXAPARIN SODIUM 80 MG/0.8ML IJ SOSY
0.5000 mg/kg | PREFILLED_SYRINGE | INTRAMUSCULAR | Status: DC
  Administered 2023-06-18: 70 mg via SUBCUTANEOUS
  Filled 2023-06-18: qty 0.8

## 2023-06-18 MED ORDER — LOSARTAN POTASSIUM 50 MG PO TABS
50.0000 mg | ORAL_TABLET | Freq: Every day | ORAL | Status: DC
  Administered 2023-06-19: 50 mg via ORAL
  Filled 2023-06-18: qty 1

## 2023-06-18 NOTE — Inpatient Diabetes Management (Addendum)
Inpatient Diabetes Program Recommendations  AACE/ADA: New Consensus Statement on Inpatient Glycemic Control (2015)  Target Ranges:  Prepandial:   less than 140 mg/dL      Peak postprandial:   less than 180 mg/dL (1-2 hours)      Critically ill patients:  140 - 180 mg/dL   Lab Results  Component Value Date   GLUCAP 235 (H) 06/18/2023   HGBA1C 9.4 (H) 01/08/2023    Review of Glycemic Control  Latest Reference Range & Units 06/17/23 08:51 06/17/23 12:49 06/17/23 16:36 06/17/23 20:48 06/17/23 23:04 06/18/23 07:48  Glucose-Capillary 70 - 99 mg/dL 956 (H) 387 (H) 564 (H) 352 (H) 295 (H) 235 (H)  (H): Data is abnormally high  Diabetes history: DM2 Outpatient Diabetes medications: Lantus 20 units every day, Humalog 10 units TID, Metformin 1000 mg QD Current orders for Inpatient glycemic control: Novolog 0-15 units TID and HS, Solumedrol 20 mg QD   Inpatient Diabetes Program Recommendations:     Please consider:  Semglee 10 units every day (50% of home dose) Novolog 4 units TID with meals if he consumes at least 50%.   Will continue to follow while inpatient.   Thank you, Dulce Sellar, MSN, CDCES Diabetes Coordinator Inpatient Diabetes Program [phone] (team pager from 8a-5p)

## 2023-06-18 NOTE — Progress Notes (Addendum)
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 Easily arousable and agreeable to OT session. He denies pain.  Pt performed bed mobility with SUP d/t safety and speed of movement. Pt initially dizzy, but subsided quickly. Linens noted to be soiled from urine, called NT to bring clean. Used urinal seated at EOB with SUP and good balance. Pt able to perform STS from EOB to RW with bed height elevated and CGA for linens to be changed.  Pt required CGA for SPT from bed to recliner using RW. Did not appear to have any buckling today, but reports this has been an ongoing issue with mult falls for him including one while hospitalized at [facility]. Blood on linens noted, OT notified RN and NT. 02 sats WFL throughout. Lunch tray arrived and pt left in recliner with all needs in place and set up assist for self feeding. He will cont to require skilled acute OT services to maximize his safety and IND to return to PLOF. *Addendum recommendation left as SNF pending how he does with increased ambulation.       If plan is discharge home, recommend the following:  A little help with walking and/or transfers;A little help with bathing/dressing/bathroom;Assistance with cooking/housework;Assist for transportation;Help with stairs or ramp for entrance   Equipment Recommendations  BSC/3in1    Recommendations for Other Services      Precautions / Restrictions Precautions Precautions: Fall Restrictions Weight Bearing Restrictions: No Other Position/Activity Restrictions: Careful, buckles       Mobility Bed Mobility   Bed Mobility:  Supine to Sit     Supine to sit: Supervision     General bed mobility comments: very fast with initial dizziness that subsided    Transfers Overall transfer level: Needs assistance Equipment used: Rolling walker (2 wheels) Transfers: Sit to/from Stand, Bed to chair/wheelchair/BSC Sit to Stand: Contact guard assist, From elevated surface Stand pivot transfers: Contact guard assist   Step pivot transfers: Contact guard assist     General transfer comment: CGA fro STS and SPT to recliner using RW     Balance Overall balance assessment: Needs assistance Sitting-balance support: Feet supported Sitting balance-Leahy Scale: Good Sitting balance - Comments: used urinal at EOB with no assist or concern with balance   Standing balance support: During functional activity, Reliant on assistive device for balance, Bilateral upper extremity supported Standing balance-Leahy Scale: Fair                             ADL either performed or assessed with clinical judgement   ADL Overall ADL's : Needs assistance/impaired Eating/Feeding: Set up;Sitting                       Toilet Transfer: Rolling walker (2 wheels);Contact guard assist;Cueing for safety Toilet Transfer Details (indicate cue type and reason): simulated to recliner                Extremity/Trunk Assessment Upper Extremity Assessment Upper Extremity Assessment: Overall WFL for tasks assessed   Lower Extremity Assessment Lower Extremity Assessment: Generalized  weakness        Vision       Perception     Praxis      Cognition Arousal: Alert Behavior During Therapy: WFL for tasks assessed/performed Overall Cognitive Status: No family/caregiver present to determine baseline cognitive functioning                                 General Comments: Needed extra time and cuing to follow commands; impulsive at times        Exercises Other Exercises Other Exercises: Edu  provided on pacing/slowing down movements to avoid dizziness and decrease risk of falls.    Shoulder Instructions       General Comments 02 WFL throughout session    Pertinent Vitals/ Pain       Pain Assessment Pain Assessment: No/denies pain  Home Living                                          Prior Functioning/Environment              Frequency  Min 1X/week        Progress Toward Goals  OT Goals(current goals can now be found in the care plan section)  Progress towards OT goals: Progressing toward goals  Acute Rehab OT Goals Patient Stated Goal: improve strength OT Goal Formulation: With patient Time For Goal Achievement: 06/28/23 Potential to Achieve Goals: Good  Plan      Co-evaluation                 AM-PAC OT "6 Clicks" Daily Activity     Outcome Measure   Help from another person eating meals?: None Help from another person taking care of personal grooming?: None Help from another person toileting, which includes using toliet, bedpan, or urinal?: A Lot Help from another person bathing (including washing, rinsing, drying)?: A Lot Help from another person to put on and taking off regular upper body clothing?: A Little Help from another person to put on and taking off regular lower body clothing?: A Lot 6 Click Score: 17    End of Session Equipment Utilized During Treatment: Rolling walker (2 wheels)  OT Visit Diagnosis: Unsteadiness on feet (R26.81);Other abnormalities of gait and mobility (R26.89)   Activity Tolerance Patient tolerated treatment well   Patient Left in chair;with call bell/phone within reach;with nursing/sitter in room;with chair alarm set   Nurse Communication Mobility status        Time: 0865-7846 OT Time Calculation (min): 30 min  Charges: OT General Charges $OT Visit: 1 Visit OT Treatments $Self Care/Home Management : 8-22 mins $Therapeutic Activity: 8-22 mins  Cody Alexander,  OTR/L  06/18/23, 12:53 PM  Cody Alexander 06/18/2023, 12:48 PM

## 2023-06-18 NOTE — Progress Notes (Addendum)
PROGRESS NOTE    Etheridge Whitmarsh  WJX:[id number] DOB: 1955/11/08 DOA: 06/12/2023 PCP: [facility] [employer]  [id number]-AA  LOS: 6 days   Brief hospital course:   Assessment & Plan: 67 year old male with history of CHF, COPD, CAD, HTN, T2DM, OSA presenting to the Emergency Department for evaluation of chest pain and shortness of breath.     Patient reports that for the past 3 days he has had worsening chest pain and shortness of breath as well as lower extremity edema.     States he recently started taking Ozempic and that has caused some GI upset so he has not been taking his medications regularly.    Specifically has not been adherent with his diuresis.   reports that CPAP mask is broken so he has not been wearing that at night.   Normally wears 3 to 4 L of oxygen.  With EMS was found to be 85% on 3 L.  Received 4 baby aspirin and 1 spray of nitroglycerin    Placed on biPAP.  Severe resp acidosis despite 2 hrs of biPAP.  Pt was admitted by PCCM.   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  --BiPAP at bedtime   DM2: likely poorly controlled.  With Hyperglycemia  --glargine 10u daily --mealtime 4u TID --ACHS and SSI   Peripheral neuropathy:  --cont Lyrica 50 mg TID --d/c opioids   Morbid obesity: BMI 41.9. Complicates overall care & prognosis    HLD: continue on statin   Generalized weakness:  --pt refused SNF rehab   DVT prophylaxis: Lovenox SQ Code Status: Full code  Family Communication:  Level of care: Progressive Dispo:   The patient is from: home Anticipated d/c is to: home (pt refused SNF rehab) Anticipated d/c date is: tomorrow   Subjective and Interval History:  Pt reported breathing not quite at  baseline.  Admitted to ambulatory dysfunction but still refusing rehab.   Objective: Vitals:   06/18/23 0435 06/18/23 0755 06/18/23 1229 06/18/23 1644  BP:  (!) 145/80 (!) 155/98 128/80  Pulse:  85 94 85  Resp:  16  16  Temp:  97.6 F (36.4 C)  98.2 F (36.8 C)  TempSrc:    Oral  SpO2:  99% 92% 95%  Weight: (!) 138.4 kg     Height:        Intake/Output Summary (Last 24 hours) at 06/18/2023 1923 Last data filed at 06/18/2023 1800 Gross per 24 hour  Intake --  Output 4325 ml  Net -4325 ml   Filed Weights   06/13/23 0337 06/15/23 0343 06/18/23 0435  Weight: (!) 140.2 kg (!) 140.3 kg (!) 138.4 kg    Examination:   Constitutional: NAD, AAOx3 HEENT: conjunctivae and lids normal, EOMI CV: No cyanosis.   RESP: normal respiratory effort, on 4L Neuro: II - XII grossly intact.   Psych: Normal mood and affect.  Appropriate judgement and reason   Data Reviewed: I have personally reviewed labs and imaging studies  Time spent: 35 minutes  [name], MD [facility] If 7PM-7AM, please contact night-coverage 06/18/2023, 7:23 PM

## 2023-06-18 NOTE — Progress Notes (Signed)
  Chaplain On-Call responded to Spiritual Care Consult Order from Cody David, MD.  Chaplain met the patient as he was lying in bed, and as he described multiple health conditions which are affecting him.  The patient also stated that his goal is to be discharged as soon as he is able to walk.  The patient shared much about significant losses in his family, including his parents and a brother. He is strengthened by special memories about them.  Chaplain provided spiritual and emotional support and prayer.  Chaplain Morene Crocker., [facility]

## 2023-06-18 NOTE — TOC Progression Note (Addendum)
Transition of Care [facility]) - Progression Note    Patient Details  Name: Cody Alexander MRN: [id number] Date of Birth: [birth year]  [facility]) CM/SW Contact  Darolyn Rua, [place] Phone Number: 06/18/2023, 11:46 AM  Clinical Narrative:     10/23:  CSW met with patient at bedside to review PT recommendation change to SNF, he reports he declines snf and wants to go home with [facility] at dc. CSW notes Verlon Au with DSS called this CSW and said she will follow up with him at home, aware of potential dc tomorrow.   10/22: CSW met with patient at bedside to confirm dc plan for home with [facility] potentially today, CSW met to confirm he has a ride. However patient reports he's not feeling well and can hardly move, reports he is not ready to dc today.    CSW has updated MD and RN on above.  Expected Discharge Plan: Home/Self Care Barriers to Discharge: Continued Medical Work up  Expected Discharge Plan and Services     Post Acute Care Choice: NA Living arrangements for the past 2 months: Single Family Home                                       Social Determinants of Health (SDOH) Interventions SDOH Screenings   Food Insecurity: No Food Insecurity (06/16/2023)  Housing: Low Risk  (06/16/2023)  Transportation Needs: No Transportation Needs (06/16/2023)  Utilities: Not At Risk (06/16/2023)  Financial Resource Strain: Medium Risk (02/19/2023)   Received from [facility]  Physical Activity: Inactive (07/02/2021)   Received from [facility], [facility]  Social Connections: Moderately Isolated (10/21/2022)   Received from [facility], [facility]  Stress: Stress Concern Present (10/21/2022)   Received from [facility], [facility]  Tobacco Use: Medium Risk (06/12/2023)  Health Literacy: Medium Risk (10/21/2022)   Received from [facility], [facility]    Readmission Risk Interventions    06/25/2022   10:13 AM   Readmission Risk Prevention Plan  Transportation Screening Complete  PCP or Specialist Appt within 3-5 Days Complete  HRI or Home Care Consult Complete  Social Work Consult for Recovery Care Planning/Counseling Complete  Palliative Care Screening Not Applicable  Medication Review [employer]) Complete

## 2023-06-18 NOTE — Progress Notes (Signed)
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  He was able to push himself and actually go ~120 ft with walker in the hallway (chair follow, did not need) using the walker and slow but consistent cadence.  He reports that he often takes his O2 off to negotiate around the home, encouraged him to insure he is using O2 as prescribed.  Pt endorses that he still feels weak, but was pleased about home much better he did today than yesterday and states, more than once, that he feels he is now physically ready to go home and that he does not wish to go to any rehab.  Pt will benefit from continued PT to address functional limitations, continue with POC.    If plan is discharge home, recommend the following: Help with stairs or ramp for entrance;Assist for transportation;Direct supervision/assist for financial management;Assistance with cooking/housework;Two people to help with walking and/or transfers;A lot of help with bathing/dressing/bathroom   Can travel by private vehicle     No  Equipment Recommendations  None recommended by PT    Recommendations for Other Services       Precautions / Restrictions Precautions Precautions: Fall Restrictions Weight Bearing Restrictions: No Other Position/Activity Restrictions: neuropathy/buckles     Mobility  Bed Mobility Overal bed mobility: Needs Assistance Bed Mobility: Supine to Sit     Supine to sit: Contact guard     General bed mobility comments: Able to rise from slightly  elevated height bed with heavy UE use and cuing    Transfers Overall transfer level: Needs assistance Equipment used: Rolling walker (2 wheels) Transfers: Sit to/from Stand, Bed to chair/wheelchair/BSC Sit to Stand: Contact guard assist, From elevated surface           General transfer comment: CGA for STS from bed and from recliner    Ambulation/Gait Ambulation/Gait assistance: Contact guard assist Gait Distance (Feet): 120 Feet Assistive device: Rolling walker (2 wheels)         General Gait Details: Pt was motivated to show that he can manage safely at home.  He was able to maintain a slow but consistent cadence (still with baseline flatfoot neuropathic cadence) and heavy RW reliance.  Pt on 4L t/o the session with SpO2 numbers difficult to get but seemingly dropping from low 90s to low 80s with the effort, pt does endorse some SOB/DOE   Stairs             Wheelchair Mobility     Tilt Bed    Modified Rankin (Stroke Patients Only)       Balance Overall balance assessment: Needs assistance Sitting-balance support: Feet supported Sitting balance-Leahy Scale: Good Sitting balance - Comments: used urinal at EOB with no assist or concern with balance   Standing balance support: During functional activity, Reliant on assistive device for balance, Bilateral upper extremity supported Standing balance-Leahy Scale: Fair Standing balance comment: baseline neuropathy necessitates use of AD, but no LOBs or buckling with today's standing effort  Cognition Arousal: Alert Behavior During Therapy: WFL for tasks assessed/performed Overall Cognitive Status: No family/caregiver present to determine baseline cognitive functioning                                          Exercises      General Comments General comments (skin integrity, edema, etc.): on 4L O2, SpO2 mind 90s at rest, drops with any acitivity       Pertinent Vitals/Pain Pain Assessment Pain Assessment: No/denies pain    Home Living                          Prior Function            PT Goals (current goals can now be found in the care plan section) Progress towards PT goals: Progressing toward goals    Frequency    Min 1X/week      PT Plan      Co-evaluation              AM-PAC PT "6 Clicks" Mobility   Outcome Measure  Help needed turning from your back to your side while in a flat bed without using bedrails?: A Little Help needed moving from lying on your back to sitting on the side of a flat bed without using bedrails?: A Little Help needed moving to and from a bed to a chair (including a wheelchair)?: A Little Help needed standing up from a chair using your arms (e.g., wheelchair or bedside chair)?: A Little Help needed to walk in hospital room?: A Little Help needed climbing 3-5 steps with a railing? : A Lot 6 Click Score: 17    End of Session Equipment Utilized During Treatment: Gait belt Activity Tolerance: Patient tolerated treatment well Patient left: in chair;with call bell/phone within reach;with chair alarm set Nurse Communication: Mobility status PT Visit Diagnosis: Repeated falls (R29.6);Difficulty in walking, not elsewhere classified (R26.2);Other abnormalities of gait and mobility (R26.89)     Time: 1403-1430 PT Time Calculation (min) (ACUTE ONLY): 27 min  Charges:    $Gait Training: 8-22 mins $Therapeutic Activity: 8-22 mins PT General Charges $$ ACUTE PT VISIT: 1 Visit                     Malachi Pro, DPT 06/18/2023, 3:38 PM

## 2023-06-18 NOTE — Progress Notes (Signed)
Speech Language Pathology Treatment: Dysphagia  Patient Details Name: Cody Alexander MRN: [id number] DOB: 06-20-1956 Today's Date: 06/18/2023 Time: 1210-1240 SLP Time Calculation (min) (ACUTE ONLY): 30 min  Assessment / Plan / Recommendation Clinical Impression  Pt seen for ongoing assessment of swallowing, trials to return to thin liquid in diet. MD yesterday endorsed adjusting his sedating Pain medications d/t pt being lethargic/drowsy(too lethargic for eating/drinking). Today, he is alert, verbally responsive and engaged in basic conversation w/ SLP as he indicated wants/needs. He described his weakness w/ walking("I don't think I can right now") and returning to the home where he stays. Pt is being followed by PT/OT currently. Pt is on [place] O2 4L; wbc wnl. Afebrile.  Pt explained general aspiration precautions and agreed verbally to the need for following them especially sitting upright for all oral intake -- supported behind the back more in the chair. Assistance given d/t min overall weakness. W/ setup, he fed himself po's of soft solids and thin liquids via cup/straw. No overt clinical s/s of aspiration were noted w/ any consistency; respiratory status remained calm and unlabored, vocal quality clear b/t trials. No coughing occurred. Pharyngeal swallowing appeared prompt. Oral phase appeared grossly [facility] for bolus management and timely A-P transfer for swallowing; mastication of softened solids adequate in setting of missing Molars. Oral clearing achieved w/ all consistencies. NSG denied any deficits in swallowing as well. Pt appears at reduced risk for aspiration when following general aspiration precautions w/ cut meats/foods moistened for ease of mastication secondary to missing Molars. However, pt does have challenging factors that could impact oropharyngeal swallowing to include use of sedating Pain medications. Pt should be monitored by NSG w/ any po's/pills if drowsy-appearing to determine  if safe to eat/drink at that moment. Pt should only eat/drink when FULLY alert/awake and can follow general aspiration precautions as recommended. NSG updated/agreed. Pt agreed.   Recommend continue a mech soft/Regular diet for ease of soft, cut foods w/ gravies added to moisten; Thin liquids. Monitor any straw use and do not use if increased coughing noted. Recommend general aspiration precautions to include reducing distractions/talking during po's/meals and use of SMALL bites/sips SLOWLY. Pills Whole in Puree IF ANY difficulty swallowing w/ Water w/ NSG. Tray setup and positioning assistance Upright in chair/bed for po's/meals. REFLUX precautions d/t pt's baseline.  No further ST services indicated at this time; will sign off w/ MD to reconsult if any new needs indicated while admitted. MD/NSG updated, agreed. Precautions posted at bedside.         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     CXR: Mild left lingular subsegmental atelectasis or scarring is noted.      SLP Plan  All goals met      Recommendations for follow up therapy are one component of a multi-disciplinary discharge planning process, led by the attending physician.  Recommendations may be updated based on patient status, additional functional criteria and insurance authorization.    Recommendations  Diet recommendations: Dysphagia 3 (mechanical soft);Thin liquid (/Regular = easy to chew foods d/t Missing Dentition) Liquids provided via: Cup;Straw (monitor) Medication Administration: Whole meds with puree (as needed for safer swallowing per NSG) Supervision: Patient able to self feed (setup support/monitoring for follow through w/ general precautions) Compensations: Minimize environmental distractions;Slow rate;Small  sips/bites;Lingual sweep for clearance of pocketing;Follow solids with liquid Postural Changes and/or Swallow Maneuvers: Out of bed for meals;Seated upright 90 degrees;Upright 30-60 min after meal                 (Dietician f/u) Oral care BID;Oral care before and after PO;Patient independent with oral care (setup support)   Set up Supervision/Assistance Dysphagia, unspecified (R13.10) (missing dentition/molars; impact of sedating Pain meds)     All goals met        Jerilynn Som, MS, CCC-SLP Speech Language Pathologist Rehab Services; [facility] [phone] (ascom) Andalyn Heckstall  06/18/2023, 12:42 PM

## 2023-06-18 NOTE — Plan of Care (Signed)

## 2023-06-19 ENCOUNTER — Other Ambulatory Visit: Payer: Self-pay

## 2023-06-19 DIAGNOSIS — J9622 Acute and chronic respiratory failure with hypercapnia: Secondary | ICD-10-CM | POA: Diagnosis not present

## 2023-06-19 DIAGNOSIS — J9621 Acute and chronic respiratory failure with hypoxia: Secondary | ICD-10-CM | POA: Diagnosis not present

## 2023-06-19 LAB — GLUCOSE, CAPILLARY: Glucose-Capillary: 185 mg/dL — ABNORMAL HIGH (ref 70–99)

## 2023-06-19 MED ORDER — LOSARTAN POTASSIUM 50 MG PO TABS
50.0000 mg | ORAL_TABLET | Freq: Every day | ORAL | 0 refills | Status: DC
  Filled 2023-06-19: qty 90, 90d supply, fill #0

## 2023-06-19 MED ORDER — INSULIN LISPRO (1 UNIT DIAL) 100 UNIT/ML (KWIKPEN)
10.0000 [IU] | PEN_INJECTOR | Freq: Three times a day (TID) | SUBCUTANEOUS | 0 refills | Status: DC
  Filled 2023-06-19: qty 15, 50d supply, fill #0

## 2023-06-19 MED ORDER — ALBUTEROL SULFATE HFA 108 (90 BASE) MCG/ACT IN AERS
2.0000 | INHALATION_SPRAY | RESPIRATORY_TRACT | 0 refills | Status: AC | PRN
  Filled 2023-06-19: qty 6.7, 17d supply, fill #0

## 2023-06-19 MED ORDER — BUDESONIDE-FORMOTEROL FUMARATE 160-4.5 MCG/ACT IN AERO
2.0000 | INHALATION_SPRAY | Freq: Two times a day (BID) | RESPIRATORY_TRACT | 2 refills | Status: DC
  Filled 2023-06-19: qty 10.2, 30d supply, fill #0

## 2023-06-19 MED ORDER — BUMETANIDE 1 MG PO TABS
3.0000 mg | ORAL_TABLET | Freq: Every day | ORAL | 0 refills | Status: DC
  Filled 2023-06-19: qty 270, 90d supply, fill #0

## 2023-06-19 MED ORDER — LANTUS SOLOSTAR 100 UNIT/ML ~~LOC~~ SOPN
20.0000 [IU] | PEN_INJECTOR | Freq: Every day | SUBCUTANEOUS | 0 refills | Status: DC
  Filled 2023-06-19: qty 15, 75d supply, fill #0

## 2023-06-19 MED ORDER — METFORMIN HCL 1000 MG PO TABS
1000.0000 mg | ORAL_TABLET | Freq: Every day | ORAL | 0 refills | Status: DC
  Filled 2023-06-19: qty 90, 90d supply, fill #0

## 2023-06-19 MED ORDER — IPRATROPIUM-ALBUTEROL 0.5-2.5 (3) MG/3ML IN SOLN
3.0000 mL | Freq: Four times a day (QID) | RESPIRATORY_TRACT | 1 refills | Status: DC | PRN
  Filled 2023-06-19: qty 90, 8d supply, fill #0

## 2023-06-19 MED ORDER — TORSEMIDE 20 MG PO TABS
40.0000 mg | ORAL_TABLET | Freq: Every day | ORAL | 2 refills | Status: DC
  Filled 2023-06-19: qty 60, 30d supply, fill #0

## 2023-06-19 MED ORDER — METOPROLOL SUCCINATE ER 25 MG PO TB24
25.0000 mg | ORAL_TABLET | Freq: Every day | ORAL | 0 refills | Status: DC
  Filled 2023-06-19: qty 90, 90d supply, fill #0

## 2023-06-19 NOTE — Plan of Care (Signed)
  Problem: Fluid Volume: Goal: Ability to maintain a balanced intake and output will improve Outcome: Progressing   

## 2023-06-19 NOTE — TOC Transition Note (Signed)
Transition of Care [facility]) - CM/SW Discharge Note   Patient Details  Name: Cody Alexander MRN: [id number] Date of Birth: 24-Jul-1956  Transition of Care [facility]) CM/SW Contact:  Darolyn Rua, LCSW Phone Number: 06/19/2023, 9:39 AM   Clinical Narrative:     Patient to discharge home today with [facility] [place] with [facility] informed of discharge.   Patient has  ride through his friend Margie's daughter, confirmed they can bring home oxygen.   Patient reports no additional discharge needs at this time.    Final next level of care: Home w Home Health Services Barriers to Discharge: No Barriers Identified   Patient Goals and CMS Choice CMS Medicare.gov Compare Post Acute Care list provided to:: Patient Choice offered to / list presented to : Patient  Discharge Placement                         Discharge Plan and Services Additional resources added to the After Visit Summary for       Post Acute Care Choice: NA                               Social Determinants of Health (SDOH) Interventions SDOH Screenings   Food Insecurity: No Food Insecurity (06/16/2023)  Housing: Low Risk  (06/16/2023)  Transportation Needs: No Transportation Needs (06/16/2023)  Utilities: Not At Risk (06/16/2023)  Financial Resource Strain: Medium Risk (02/19/2023)   [facility]  Physical Activity: Inactive (07/02/2021)   Received from [facility], [facility]  Social Connections: Moderately Isolated (10/21/2022)   Received from [facility], [facility]  Stress: Stress Concern Present (10/21/2022)   Received from [facility], [facility]  Tobacco Use: Medium Risk (06/12/2023)  Health Literacy: Medium Risk (10/21/2022)   Received from [facility], [facility]     Readmission Risk Interventions    06/25/2022   10:13 AM  Readmission Risk Prevention Plan  Transportation Screening Complete  PCP or Specialist Appt within  3-5 Days Complete  HRI or Home Care Consult Complete  Social Work Consult for Recovery Care Planning/Counseling Complete  Palliative Care Screening Not Applicable  Medication Review Oceanographer) Complete

## 2023-06-19 NOTE — Discharge Summary (Signed)
Physician Discharge Summary   Cody Alexander  male DOB: September 17, 1955  XBJ:[id number]  PCP: [facility]  Admit date: 06/12/2023 Discharge date: 06/19/2023  Admitted From: home Disposition:  home.  Pt declined SNF rehab. Home Health: Yes CODE STATUS: Full code  Discharge Instructions     Diet - low sodium heart healthy   Complete by: As directed    Diet Carb Modified   Complete by: As directed    Discharge instructions   Complete by: As directed    Your essential medications are refilled for 3 months.  You will need to follow up with PCP for further refills. [facility] Course:  For full details, please see H&P, progress notes, consult notes and ancillary notes.  Briefly,  Cody Alexander is a 68 year old male with history of CHF, COPD on 3-4L O2, CAD, HTN, T2DM, OSA presenting to the Emergency Department for evaluation of chest pain and shortness of breath.     Specifically has not been adherent with his diuresis.  reported that CPAP mask is broken so he has not been wearing that at night.  With EMS was found to be 85% on 3 L.  Placed on biPAP.  Severe resp acidosis despite 2 hrs of biPAP.  Pt was admitted by PCCM.   Acute diastolic CHF:  --received IV lasix 60 mg x1 and 40 mg x2 doses --Home diuretic switched from bumex to equivalent torsemide, since bumex was not available at [facility]. --cont home losartan and Toprol   Acute COPD exacerbation:  Received IV solumedrol x5 days.  Pt was not taking home Symbicort PTA.  Home Symbicort and DuoNeb PRN to be resumed after discharge.  Rx refilled and provided to pt prior to discharge.  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  Pt was on 4L O2 prior to discharge.   DM2: likely poorly controlled.  With Hyperglycemia  --discharged back on home diabetic regimen as below.  Lantus and Humalog refilled and  provided to pt prior to discharge.   Morbid obesity: BMI 41.9.  Complicates overall care & prognosis    HLD:  continue on statin   Generalized weakness:  --pt refused SNF rehab   Unless noted above, medications under "STOP" list are ones pt was not taking PTA.  Discharge Diagnoses:  Principal Problem:   Respiratory failure (HCC)   30 Day Unplanned Readmission Risk Score    Flowsheet Row ED to Hosp-Admission (Current) from 06/12/2023 in [facility] PCU  30 Day Unplanned Readmission Risk Score (%) 23.33 Filed at 06/19/2023 0801       This score is the patient's risk of an unplanned readmission within 30 days of being discharged (0 -100%). The score is based on dignosis, age, lab data, medications, orders, and past utilization.   Low:  0-14.9   Medium: 15-21.9   High: 22-29.9   Extreme: 30 and above         Discharge Instructions:  Allergies as of 06/19/2023       Reactions   Erythromycin Anaphylaxis, Swelling   Had eye swelling with erythromycin during a time when he had perf ear drum. Tolerated azithromycin.        Medication List     STOP taking these medications    bumetanide 1 MG tablet Commonly known as: BUMEX   diclofenac Sodium 1 % Gel Commonly known  as: VOLTAREN   docusate sodium 100 MG capsule Commonly known as: COLACE   hydrOXYzine 10 MG tablet Commonly known as: ATARAX   isosorbide dinitrate 30 MG tablet Commonly known as: ISORDIL   Selenium Sulfide 2.25 % Sham   triamcinolone cream 0.1 % Commonly known as: KENALOG       TAKE these medications    albuterol 108 (90 Base) MCG/ACT inhaler Commonly known as: Proventil HFA Inhale 2 puffs into the lungs every 4 (four) hours as needed. What changed:  reasons to take this Another medication with the same name was removed. Continue taking this medication, and follow the directions you see here.   aspirin EC 81 MG EC tablet Generic drug: aspirin EC Take 81 mg by  mouth daily.   atorvastatin 40 MG tablet Commonly known as: LIPITOR Take 40 mg by mouth daily.   budesonide-formoterol 160-4.5 MCG/ACT inhaler Commonly known as: SYMBICORT Inhale 2 puffs into the lungs 2 (two) times daily.   insulin lispro 100 UNIT/ML KwikPen Commonly known as: HUMALOG Inject 10 Units into the skin 3 (three) times daily with meals.   ipratropium-albuterol 0.5-2.5 (3) MG/3ML Soln Commonly known as: DUONEB Take 3 mLs by nebulization every 6 (six) hours as needed.   Lantus SoloStar 100 UNIT/ML Solostar Pen Generic drug: insulin glargine Inject 20 Units into the skin daily.   losartan 50 MG tablet Commonly known as: COZAAR Take 1 tablet (50 mg total) by mouth daily.   magnesium oxide 400 MG tablet Commonly known as: MAG-OX Take 1 tablet by mouth 2 (two) times daily.   metFORMIN 1000 MG tablet Commonly known as: GLUCOPHAGE Take 1 tablet (1,000 mg total) by mouth daily.   metoprolol succinate 25 MG 24 hr tablet Commonly known as: TOPROL-XL Take 1 tablet (25 mg total) by mouth daily.   torsemide 20 MG tablet Commonly known as: DEMADEX Take 2 tablets (40 mg total) by mouth daily.         Follow-up Information     [facility] Follow up in 1 week(s).   Contact information: [address] [place] [address] [phone]         Vida Rigger, MD Follow up in 2 week(s).   Specialty: Pulmonary Disease Contact information: [address] [place] [address] [phone]                 Allergies  Allergen Reactions   Erythromycin Anaphylaxis and Swelling    Had eye swelling with erythromycin during a time when he had perf ear drum.  Tolerated azithromycin.     The results of significant diagnostics from this hospitalization (including imaging, microbiology, ancillary and laboratory) are listed below for reference.   Consultations:   Procedures/Studies: DG Chest Port 1 View  Result  Date: 06/17/2023 CLINICAL DATA:  Dyspnea. EXAM: PORTABLE CHEST 1 VIEW COMPARISON:  June 12, 2023. FINDINGS: Stable cardiomegaly. Right lung is clear. Mild left lingular subsegmental atelectasis or scarring is noted. Right lung is clear. Bony thorax is unremarkable. IMPRESSION: Mild left lingular subsegmental atelectasis or scarring is noted. Electronically Signed   By: Lupita Raider M.D.   On: 06/17/2023 10:45   DG Chest Port 1 View  Result Date: 06/12/2023 CLINICAL DATA:  shortness of breath EXAM: PORTABLE CHEST 1 VIEW COMPARISON:  May 2024 FINDINGS: The heart appears enlarged. No pleural effusion. No pneumothorax. No lobar consolidation. Query pulmonary vascular congestion. No acute osseous abnormality. IMPRESSION: Cardiomegaly with pulmonary vascular congestion. No  signs of overt edema or effusion. Electronically Signed   By: Olive Bass M.D.   On: 06/12/2023 14:20      Labs: BNP (last 3 results) Recent Labs    01/05/23 1535 01/07/23 2256 06/12/23 1122  BNP 272.1* 59.7 126.2*   Basic Metabolic Panel: Recent Labs  Lab 06/13/23 0434 06/14/23 0337 06/15/23 0338 06/16/23 0346 06/17/23 0501 06/18/23 0406  NA 139 137 135 138 138 142  K 3.5 3.4* 4.4 4.4 4.5 4.8  CL 91* 95* 92* 93* 92* 91*  CO2 36* 36* 35* 36* 41* 44*  GLUCOSE 147* 134* 289* 180* 201* 151*  BUN 16 21 24* 25* 22 25*  CREATININE 0.90 1.04 0.92 0.98 0.93 0.91  CALCIUM 8.8* 8.3* 8.4* 8.6* 8.6* 8.6*  MG 2.2  --   --   --   --   --   PHOS 3.1  --   --   --   --   --    Liver Function Tests: Recent Labs  Lab 06/12/23 1122  AST 21  ALT 25  ALKPHOS 58  BILITOT 1.0  PROT 7.3  ALBUMIN 3.6   No results for input(s): "LIPASE", "AMYLASE" in the last 168 hours. No results for input(s): "AMMONIA" in the last 168 hours. CBC: Recent Labs  Lab 06/12/23 1122 06/12/23 1758 06/14/23 0337 06/15/23 0338 06/16/23 0346 06/17/23 0501 06/18/23 0406  WBC 8.3   < > 6.3 6.9 9.0 6.4 6.5  NEUTROABS 6.5  --   --   --    --   --   --   HGB 12.4*   < > 12.6* 13.1 12.1* 13.0 12.5*  HCT 42.7   < > 41.1 42.3 40.2 43.9 42.5  MCV 97.7   < > 92.8 91.8 93.9 96.1 95.7  PLT 232   < > 215 242 228 233 217   < > = values in this interval not displayed.   Cardiac Enzymes: No results for input(s): "CKTOTAL", "CKMB", "CKMBINDEX", "TROPONINI" in the last 168 hours. BNP: Invalid input(s): "POCBNP" CBG: Recent Labs  Lab 06/18/23 0748 06/18/23 1221 06/18/23 1559 06/18/23 2128 06/19/23 0839  GLUCAP 235* 272* 359* 248* 185*   D-Dimer No results for input(s): "DDIMER" in the last 72 hours. Hgb A1c No results for input(s): "HGBA1C" in the last 72 hours. Lipid Profile No results for input(s): "CHOL", "HDL", "LDLCALC", "TRIG", "CHOLHDL", "LDLDIRECT" in the last 72 hours. Thyroid function studies No results for input(s): "TSH", "T4TOTAL", "T3FREE", "THYROIDAB" in the last 72 hours.  Invalid input(s): "FREET3" Anemia work up No results for input(s): "VITAMINB12", "FOLATE", "FERRITIN", "TIBC", "IRON", "RETICCTPCT" in the last 72 hours. Urinalysis    Component Value Date/Time   COLORURINE AMBER (A) 01/05/2023 1652   APPEARANCEUR CLOUDY (A) 01/05/2023 1652   APPEARANCEUR Clear 08/20/2013 0849   LABSPEC 1.020 01/05/2023 1652   LABSPEC 1.014 08/20/2013 0849   PHURINE 5.5 01/05/2023 1652   GLUCOSEU 500 (A) 01/05/2023 1652   GLUCOSEU Negative 08/20/2013 0849   HGBUR LARGE (A) 01/05/2023 1652   BILIRUBINUR NEGATIVE 01/05/2023 1652   BILIRUBINUR Negative 08/20/2013 0849   KETONESUR TRACE (A) 01/05/2023 1652   PROTEINUR >300 (A) 01/05/2023 1652   NITRITE NEGATIVE 01/05/2023 1652   LEUKOCYTESUR MODERATE (A) 01/05/2023 1652   LEUKOCYTESUR Negative 08/20/2013 0849   Sepsis Labs Recent Labs  Lab 06/15/23 0338 06/16/23 0346 06/17/23 0501 06/18/23 0406  WBC 6.9 9.0 6.4 6.5   Microbiology No results found for this or any previous visit (from the  past 240 hour(s)).   Total time spend on discharging this patient,  including the last patient exam, discussing the hospital stay, instructions for ongoing care as it relates to all pertinent caregivers, as well as preparing the medical discharge records, prescriptions, and/or referrals as applicable, is 45 minutes.    [name], MD  [facility] 06/19/2023, 10:08 AM

## 2023-06-24 NOTE — Progress Notes (Deleted)
Advanced Heart Failure Clinic Note   Referring Physician: recent admission PCP: [facility] Cardiologist: None   HPI:  [name] is a 67 y/o male with a history of COPD, CAD (s/p stent), NSTEMI, HTN, T2DM, obesity, OSA and chronic heart failure.   Admitted 01/05/23 due to respiratory distress, diaphoretic and with chest pain. O2 sats with EMS on 4L was 86% and patient was placed on NRB at 15L for transport transitioning to Bipap on arrival. Found to be hypoxic secondary to exacerbation of hfpef and NSTEMI. Failed bipap, required admission to ICU with intubation on 5/15. He was extubated on 5/23. Cardiology consulted. Patient declined heart cath. Given steroids/ antibiotics for pneumonia/ COPD.   Admitted 02/13/23 due to constant atypical midsternal chest pain without radiation. Has been going on for at least the last week. Troponin peaked 547, chronically elevated. EKG showed nonischemic changes. Had RRT 6/21 requiring BiPAP after VBG with acidosis and pCO2>100. ABGs improved after diuresis and BiPAP. Suspect his hypoxic respiratory failure is secondary to HF exacerbation and he also has significant OSA and is only using BiPAP minimally at home, leading to hypercapnia. Respiratory status improved with diuresis. Had teeth fractured during recent hospitalization 2/2 intubation. Dentistry completed extraction of two teet   Admitted 06/12/23 due to chest pain and shortness of breath. Thought to be due to noncompliance with diuretics and not wearing CPAP due to having a broken mask. Placed on biPAP. Severe resp acidosis despite 2 hrs of biPAP. IV diuresed. Due to COPD exacerbation, given 5 days of IV solumedrol. Weaned off bipap down to 4L nasal cannula.   Echo 02/25/18: EF 60% Echo 01/04/21: EF 60-65% with moderate LVH, Grade I DD, mild LAE/RAE Echo 08/16/21: EF 60-65% with moderate LVH, Grade I DD, normal PA pressure Echo 06/20/22: EF 70-75% Echo 11/11/22: EF 65-70% with moderate  LVH, Grade I DD, mild/ moderate LAE, mild RAD Echo 01/07/23: EF 65-70% with severe LVH, Grade II DD, mild/moderate RAE, trivial MR, mild dilatation of aortic root at 42 mm  LHC 02/27/18: Prox LAD lesion is 35% stenosed.     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 Noncardiac chest pain.   He presents today for his initial visit with a chief complaint of    Review of Systems: [y] = yes, [ ]  = no   General: Weight gain [ ] ; Weight loss [ ] ; Anorexia [ ] ; Fatigue [ ] ; Fever [ ] ; Chills [ ] ; Weakness [ ]   Cardiac: Chest pain/pressure [ ] ; Resting SOB [ ] ; Exertional SOB [ ] ; Orthopnea [ ] ; Pedal Edema [ ] ; Palpitations [ ] ; Syncope [ ] ; Presyncope [ ] ; Paroxysmal nocturnal dyspnea[ ]   Pulmonary: Cough [ ] ; Wheezing[ ] ; Hemoptysis[ ] ; Sputum [ ] ; Snoring [ ]   GI: Vomiting[ ] ; Dysphagia[ ] ; Melena[ ] ; Hematochezia [ ] ; Heartburn[ ] ; Abdominal pain [ ] ; Constipation [ ] ; Diarrhea [ ] ; BRBPR [ ]   GU: Hematuria[ ] ; Dysuria [ ] ; Nocturia[ ]   Vascular: Pain in legs with walking [ ] ; Pain in feet with lying flat [ ] ; Non-healing sores [ ] ; Stroke [ ] ; TIA [ ] ; Slurred speech [ ] ;  Neuro: Headaches[ ] ; Vertigo[ ] ; Seizures[ ] ; Paresthesias[ ] ;Blurred vision [ ] ; Diplopia [ ] ; Vision changes [ ]   Ortho/Skin: Arthritis [ ] ; Joint pain [ ] ; Muscle pain [ ] ; Joint swelling [ ] ; Back Pain [ ] ; Rash [ ]   Psych: Depression[ ] ;  Anxiety[ ]   Heme: Bleeding problems [ ] ; Clotting disorders [ ] ; Anemia [ ]   Endocrine: Diabetes [ ] ; Thyroid dysfunction[ ]    Past Medical History:  Diagnosis Date   CHF (congestive heart failure) (HCC)    COPD (chronic obstructive pulmonary disease) (HCC)    Diabetes mellitus without complication (HCC)    Hypertension    Neuropathy     Current Outpatient Medications  Medication Sig Dispense Refill   ASPIRIN EC 81 MG EC tablet Take 81 mg by mouth daily.     atorvastatin (LIPITOR) 40 MG tablet Take 40 mg  by mouth daily.     budesonide-formoterol (SYMBICORT) 160-4.5 MCG/ACT inhaler Inhale 2 puffs into the lungs 2 (two) times daily. 10.2 g 2   insulin lispro (HUMALOG) 100 UNIT/ML KwikPen Inject 10 Units into the skin 3 (three) times daily with meals. 27 mL 0   ipratropium-albuterol (DUONEB) 0.5-2.5 (3) MG/3ML SOLN Inhale 3 mLs by nebulization once every 6 (six) hours as needed. 120 mL 1   LANTUS SOLOSTAR 100 UNIT/ML Solostar Pen Inject 20 Units into the skin daily. 18 mL 0   losartan (COZAAR) 50 MG tablet Take 1 tablet (50 mg total) by mouth daily. 90 tablet 0   magnesium oxide (MAG-OX) 400 MG tablet Take 1 tablet by mouth 2 (two) times daily.     metFORMIN (GLUCOPHAGE) 1000 MG tablet Take 1 tablet (1,000 mg total) by mouth daily. 90 tablet 0   metoprolol succinate (TOPROL-XL) 25 MG 24 hr tablet Take 1 tablet (25 mg total) by mouth daily. 90 tablet 0   albuterol (PROVENTIL HFA) 108 (90 Base) MCG/ACT inhaler Inhale 2 puffs into the lungs every 4 (four) hours as needed. 6.7 g 0   torsemide (DEMADEX) 20 MG tablet Take 2 tablets (40 mg total) by mouth daily. 60 tablet 2   No current facility-administered medications for this visit.    Allergies  Allergen Reactions   Erythromycin Anaphylaxis and Swelling    Had eye swelling with erythromycin during a time when he had perf ear drum.  Tolerated azithromycin.      Social History   Socioeconomic History   Marital status: Single    Spouse name: Not on file   Number of children: Not on file   Years of education: Not on file   Highest education level: Not on file  Occupational History   Not on file  Tobacco Use   Smoking status: Former    Types: Cigarettes   Smokeless tobacco: Never  Vaping Use   Vaping status: Never Used  Substance and Sexual Activity   Alcohol use: No   Drug use: Never   Sexual activity: Not on file  Other Topics Concern   Not on file  Social History Narrative   Not on file   Social Determinants of Health    Financial Resource Strain: Medium Risk (02/19/2023)   [facility]   Overall Financial Resource Strain (CARDIA)    Difficulty of Paying Living Expenses: Somewhat hard  Food Insecurity: No Food Insecurity (06/16/2023)   Hunger Vital Sign    Worried About Running Out of Food in the Last Year: Never true    Ran Out of Food in the Last Year: Never true  Transportation Needs: No Transportation Needs (06/16/2023)   PRAPARE - Administrator, Civil Service (Medical): No    Lack of Transportation (Non-Medical): No  Physical Activity: Inactive (07/02/2021)   Received from [facility], [facility]  Care   Exercise Vital Sign    Days of Exercise per Week: 0 days    Minutes of Exercise per Session: 0 min  Stress: Stress Concern Present (10/21/2022)   Received from [facility], [facility] of Occupational Health - Occupational Stress Questionnaire    Feeling of Stress : To some extent  Social Connections: Moderately Isolated (10/21/2022)   Received [facility], [facility]   Social Connection and Isolation Panel [NHANES]    Frequency of Communication with Friends and Family: More than three times a week    Frequency of Social Gatherings with Friends and Family: More than three times a week    Attends Religious Services: 1 to 4 times per year    Active Member of [employer] or Organizations: No    Attends Banker Meetings: Never    Marital Status: Divorced  Catering manager Violence: Not At Risk (06/16/2023)   Humiliation, Afraid, Rape, and Kick questionnaire    Fear of Current or Ex-Partner: No    Emotionally Abused: No    Physically Abused: No    Sexually Abused: No      Family History  Problem Relation Age of Onset   Stroke Mother    Hypertension Father       PHYSICAL EXAM: General:  Well appearing. No respiratory difficulty HEENT: normal Neck: supple. no JVD. Carotids 2+ bilat; no bruits. No  lymphadenopathy or thyromegaly appreciated. Cor: PMI nondisplaced. Regular rate & rhythm. No rubs, gallops or murmurs. Lungs: clear Abdomen: soft, nontender, nondistended. No hepatosplenomegaly. No bruits or masses. Good bowel sounds. Extremities: no cyanosis, clubbing, rash, edema Neuro: alert & oriented x 3, cranial nerves grossly intact. moves all 4 extremities w/o difficulty. Affect pleasant.  ECG:   ASSESSMENT & PLAN:  1: Chronic heart failure with preserved ejection fraction- - suspect due to  - NYHA class - euvolemic - weighing daily - Echo 02/25/18: EF 60% - Echo 01/04/21: EF 60-65% with moderate LVH, Grade I DD, mild LAE/RAE - Echo 08/16/21: EF 60-65% with moderate LVH, Grade I DD, normal PA pressure - Echo 06/20/22: EF 70-75% - Echo 11/11/22: EF 65-70% with moderate LVH, Grade I DD, mild/ moderate LAE, mild RAD - Echo 01/07/23: EF 65-70% with severe LVH, Grade II DD, mild/moderate RAE, trivial MR, mild dilatation of aortic root at 42 mm - continue  - BNP  2: HTN- - BP - saw PCP - BMP 06/18/23 showed sodium 142, potassium 4.8, creatinine 0.91 & GFR >60  3: CAD- - saw cardiology - LHC 02/27/18: Prox LAD lesion is 35% stenosed.     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 Noncardiac chest pain.  4: DM-   Delma Freeze, FNP 06/24/23

## 2023-06-25 ENCOUNTER — Telehealth: Payer: Self-pay | Admitting: Family

## 2023-06-25 ENCOUNTER — Encounter: Payer: 59 | Admitting: Family

## 2023-06-25 NOTE — Telephone Encounter (Signed)
Patient did not show for his initial Heart Failure Clinic appointment on 06/25/23. This is the 6th appointment that he has missed.

## 2023-07-23 ENCOUNTER — Encounter: Payer: Self-pay | Admitting: Internal Medicine

## 2023-07-23 ENCOUNTER — Other Ambulatory Visit: Payer: Self-pay

## 2023-07-23 ENCOUNTER — Inpatient Hospital Stay
Admission: EM | Admit: 2023-07-23 | Discharge: 2023-08-03 | DRG: 291 | Disposition: A | Discharge status: Home Health | Payer: 59 | Attending: Hospitalist | Admitting: Hospitalist

## 2023-07-23 ENCOUNTER — Emergency Department: Payer: 59

## 2023-07-23 DIAGNOSIS — R0602 Shortness of breath: Principal | ICD-10-CM

## 2023-07-23 DIAGNOSIS — Z8249 Family history of ischemic heart disease and other diseases of the circulatory system: Secondary | ICD-10-CM

## 2023-07-23 DIAGNOSIS — I2489 Other forms of acute ischemic heart disease: Secondary | ICD-10-CM | POA: Diagnosis present

## 2023-07-23 DIAGNOSIS — J9621 Acute and chronic respiratory failure with hypoxia: Secondary | ICD-10-CM | POA: Diagnosis present

## 2023-07-23 DIAGNOSIS — Z7982 Long term (current) use of aspirin: Secondary | ICD-10-CM

## 2023-07-23 DIAGNOSIS — Z7984 Long term (current) use of oral hypoglycemic drugs: Secondary | ICD-10-CM

## 2023-07-23 DIAGNOSIS — E1165 Type 2 diabetes mellitus with hyperglycemia: Secondary | ICD-10-CM | POA: Diagnosis present

## 2023-07-23 DIAGNOSIS — G4733 Obstructive sleep apnea (adult) (pediatric): Secondary | ICD-10-CM | POA: Diagnosis present

## 2023-07-23 DIAGNOSIS — Z7951 Long term (current) use of inhaled steroids: Secondary | ICD-10-CM

## 2023-07-23 DIAGNOSIS — Z87891 Personal history of nicotine dependence: Secondary | ICD-10-CM | POA: Diagnosis not present

## 2023-07-23 DIAGNOSIS — I110 Hypertensive heart disease with heart failure: Principal | ICD-10-CM | POA: Diagnosis present

## 2023-07-23 DIAGNOSIS — Z79899 Other long term (current) drug therapy: Secondary | ICD-10-CM | POA: Diagnosis not present

## 2023-07-23 DIAGNOSIS — Z23 Encounter for immunization: Secondary | ICD-10-CM

## 2023-07-23 DIAGNOSIS — J9622 Acute and chronic respiratory failure with hypercapnia: Secondary | ICD-10-CM | POA: Diagnosis present

## 2023-07-23 DIAGNOSIS — R7989 Other specified abnormal findings of blood chemistry: Secondary | ICD-10-CM | POA: Diagnosis present

## 2023-07-23 DIAGNOSIS — Z955 Presence of coronary angioplasty implant and graft: Secondary | ICD-10-CM

## 2023-07-23 DIAGNOSIS — I878 Other specified disorders of veins: Secondary | ICD-10-CM | POA: Diagnosis present

## 2023-07-23 DIAGNOSIS — J441 Chronic obstructive pulmonary disease with (acute) exacerbation: Secondary | ICD-10-CM | POA: Diagnosis present

## 2023-07-23 DIAGNOSIS — Z823 Family history of stroke: Secondary | ICD-10-CM | POA: Diagnosis not present

## 2023-07-23 DIAGNOSIS — I161 Hypertensive emergency: Secondary | ICD-10-CM

## 2023-07-23 DIAGNOSIS — E66813 Obesity, class 3: Secondary | ICD-10-CM | POA: Diagnosis present

## 2023-07-23 DIAGNOSIS — Z5982 Transportation insecurity: Secondary | ICD-10-CM

## 2023-07-23 DIAGNOSIS — I2510 Atherosclerotic heart disease of native coronary artery without angina pectoris: Secondary | ICD-10-CM | POA: Diagnosis present

## 2023-07-23 DIAGNOSIS — Z1152 Encounter for screening for COVID-19: Secondary | ICD-10-CM | POA: Diagnosis not present

## 2023-07-23 DIAGNOSIS — X58XXXA Exposure to other specified factors, initial encounter: Secondary | ICD-10-CM | POA: Diagnosis present

## 2023-07-23 DIAGNOSIS — J449 Chronic obstructive pulmonary disease, unspecified: Secondary | ICD-10-CM | POA: Diagnosis present

## 2023-07-23 DIAGNOSIS — B358 Other dermatophytoses: Secondary | ICD-10-CM | POA: Diagnosis present

## 2023-07-23 DIAGNOSIS — Z9981 Dependence on supplemental oxygen: Secondary | ICD-10-CM | POA: Diagnosis not present

## 2023-07-23 DIAGNOSIS — Z91199 Patient's noncompliance with other medical treatment and regimen due to unspecified reason: Secondary | ICD-10-CM

## 2023-07-23 DIAGNOSIS — I509 Heart failure, unspecified: Secondary | ICD-10-CM

## 2023-07-23 DIAGNOSIS — I959 Hypotension, unspecified: Secondary | ICD-10-CM | POA: Diagnosis present

## 2023-07-23 DIAGNOSIS — I5033 Acute on chronic diastolic (congestive) heart failure: Secondary | ICD-10-CM | POA: Diagnosis present

## 2023-07-23 DIAGNOSIS — R079 Chest pain, unspecified: Secondary | ICD-10-CM | POA: Diagnosis not present

## 2023-07-23 DIAGNOSIS — Z794 Long term (current) use of insulin: Secondary | ICD-10-CM | POA: Diagnosis not present

## 2023-07-23 DIAGNOSIS — S92514A Nondisplaced fracture of proximal phalanx of right lesser toe(s), initial encounter for closed fracture: Secondary | ICD-10-CM | POA: Diagnosis present

## 2023-07-23 LAB — CBC WITH DIFFERENTIAL/PLATELET
Abs Immature Granulocytes: 0.12 10*3/uL — ABNORMAL HIGH (ref 0.00–0.07)
Basophils Absolute: 0.1 10*3/uL (ref 0.0–0.1)
Basophils Relative: 1 %
Eosinophils Absolute: 0.1 10*3/uL (ref 0.0–0.5)
Eosinophils Relative: 1 %
HCT: 37.6 % — ABNORMAL LOW (ref 39.0–52.0)
Hemoglobin: 12 g/dL — ABNORMAL LOW (ref 13.0–17.0)
Immature Granulocytes: 1 %
Lymphocytes Relative: 20 %
Lymphs Abs: 1.8 10*3/uL (ref 0.7–4.0)
MCH: 28.7 pg (ref 26.0–34.0)
MCHC: 31.9 g/dL (ref 30.0–36.0)
MCV: 90 fL (ref 80.0–100.0)
Monocytes Absolute: 0.7 10*3/uL (ref 0.1–1.0)
Monocytes Relative: 8 %
Neutro Abs: 6.6 10*3/uL (ref 1.7–7.7)
Neutrophils Relative %: 69 %
Platelets: 257 10*3/uL (ref 150–400)
RBC: 4.18 MIL/uL — ABNORMAL LOW (ref 4.22–5.81)
RDW: 13.8 % (ref 11.5–15.5)
WBC: 9.4 10*3/uL (ref 4.0–10.5)
nRBC: 0 % (ref 0.0–0.2)

## 2023-07-23 LAB — LACTIC ACID, PLASMA
Lactic Acid, Venous: 1.2 mmol/L (ref 0.5–1.9)
Lactic Acid, Venous: 2.2 mmol/L (ref 0.5–1.9)
Lactic Acid, Venous: 1.2 mmol/L (ref 0.5–1.9)

## 2023-07-23 LAB — COMPREHENSIVE METABOLIC PANEL
ALT: 21 U/L (ref 0–44)
AST: 21 U/L (ref 15–41)
Albumin: 3.4 g/dL — ABNORMAL LOW (ref 3.5–5.0)
Alkaline Phosphatase: 55 U/L (ref 38–126)
Anion gap: 8 (ref 5–15)
BUN: 21 mg/dL (ref 8–23)
CO2: 32 mmol/L (ref 22–32)
Calcium: 8.8 mg/dL — ABNORMAL LOW (ref 8.9–10.3)
Chloride: 95 mmol/L — ABNORMAL LOW (ref 98–111)
Creatinine, Ser: 1.15 mg/dL (ref 0.61–1.24)
GFR, Estimated: >60 mL/min (ref >60)
Glucose, Bld: 271 mg/dL — ABNORMAL HIGH (ref 70–99)
Potassium: 4.3 mmol/L (ref 3.5–5.1)
Sodium: 135 mmol/L (ref 135–145)
Total Bilirubin: 0.7 mg/dL (ref <1.2)
Total Protein: 6.5 g/dL (ref 6.5–8.1)

## 2023-07-23 LAB — RESPIRATORY PANEL BY PCR

## 2023-07-23 LAB — RESP PANEL BY RT-PCR (RSV, FLU A&B, COVID)  RVPGX2
Influenza A by PCR: NEGATIVE
Influenza B by PCR: NEGATIVE
Resp Syncytial Virus by PCR: NEGATIVE
SARS Coronavirus 2 by RT PCR: NEGATIVE

## 2023-07-23 LAB — TROPONIN I (HIGH SENSITIVITY)
Troponin I (High Sensitivity): 89 ng/L — ABNORMAL HIGH (ref <18)
Troponin I (High Sensitivity): 84 ng/L — ABNORMAL HIGH (ref <18)

## 2023-07-23 LAB — GLUCOSE, CAPILLARY
Glucose-Capillary: 318 mg/dL — ABNORMAL HIGH (ref 70–99)
Glucose-Capillary: 412 mg/dL — ABNORMAL HIGH (ref 70–99)
Glucose-Capillary: 299 mg/dL — ABNORMAL HIGH (ref 70–99)
Glucose-Capillary: 144 mg/dL — ABNORMAL HIGH (ref 70–99)
Glucose-Capillary: 196 mg/dL — ABNORMAL HIGH (ref 70–99)

## 2023-07-23 LAB — BRAIN NATRIURETIC PEPTIDE: B Natriuretic Peptide: 208.8 pg/mL — ABNORMAL HIGH (ref 0.0–100.0)

## 2023-07-23 LAB — HEMOGLOBIN A1C
Hgb A1c MFr Bld: 10.5 % — ABNORMAL HIGH (ref 4.8–5.6)
Mean Plasma Glucose: 254.65 mg/dL

## 2023-07-23 MED ORDER — ARFORMOTEROL TARTRATE 15 MCG/2ML IN NEBU
15.0000 ug | INHALATION_SOLUTION | Freq: Two times a day (BID) | RESPIRATORY_TRACT | Status: DC
  Administered 2023-07-24 – 2023-08-03 (×20): 15 ug via RESPIRATORY_TRACT
  Filled 2023-07-23 (×25): qty 2

## 2023-07-23 MED ORDER — INSULIN ASPART 100 UNIT/ML IJ SOLN
8.0000 [IU] | Freq: Three times a day (TID) | INTRAMUSCULAR | Status: DC
  Administered 2023-07-23 – 2023-07-25 (×6): 8 [IU] via SUBCUTANEOUS
  Filled 2023-07-23 (×5): qty 1

## 2023-07-23 MED ORDER — ALBUTEROL SULFATE (2.5 MG/3ML) 0.083% IN NEBU
2.5000 mg | INHALATION_SOLUTION | RESPIRATORY_TRACT | Status: DC | PRN
  Administered 2023-07-25 – 2023-07-30 (×6): 2.5 mg via RESPIRATORY_TRACT
  Filled 2023-07-23 (×6): qty 3

## 2023-07-23 MED ORDER — GUAIFENESIN-DM 100-10 MG/5ML PO SYRP
5.0000 mL | ORAL_SOLUTION | Freq: Once | ORAL | Status: AC
  Administered 2023-07-23: 5 mL via ORAL
  Filled 2023-07-23: qty 10

## 2023-07-23 MED ORDER — IPRATROPIUM-ALBUTEROL 0.5-2.5 (3) MG/3ML IN SOLN
3.0000 mL | Freq: Two times a day (BID) | RESPIRATORY_TRACT | Status: DC
  Administered 2023-07-23 – 2023-07-25 (×4): 3 mL via RESPIRATORY_TRACT
  Filled 2023-07-23 (×4): qty 3

## 2023-07-23 MED ORDER — MAGNESIUM OXIDE 400 MG PO TABS
400.0000 mg | ORAL_TABLET | Freq: Two times a day (BID) | ORAL | Status: DC
  Administered 2023-07-23 – 2023-08-01 (×19): 400 mg via ORAL
  Filled 2023-07-23 (×37): qty 1

## 2023-07-23 MED ORDER — INSULIN ASPART 100 UNIT/ML IJ SOLN
10.0000 [IU] | Freq: Once | INTRAMUSCULAR | Status: AC
  Administered 2023-07-23: 10 [IU] via SUBCUTANEOUS

## 2023-07-23 MED ORDER — ACETAMINOPHEN 325 MG PO TABS
650.0000 mg | ORAL_TABLET | Freq: Four times a day (QID) | ORAL | Status: DC | PRN
  Administered 2023-07-24 – 2023-07-27 (×3): 650 mg via ORAL
  Filled 2023-07-23 (×3): qty 2

## 2023-07-23 MED ORDER — ENOXAPARIN SODIUM 80 MG/0.8ML IJ SOSY
70.0000 mg | PREFILLED_SYRINGE | INTRAMUSCULAR | Status: DC
Start: 2023-07-23 — End: 2023-07-28
  Administered 2023-07-23 – 2023-07-28 (×6): 70 mg via SUBCUTANEOUS
  Filled 2023-07-23: qty 0.7
  Filled 2023-07-23 (×6): qty 0.8

## 2023-07-23 MED ORDER — GUAIFENESIN ER 600 MG PO TB12
600.0000 mg | ORAL_TABLET | Freq: Two times a day (BID) | ORAL | Status: DC
  Administered 2023-07-23 (×2): 600 mg via ORAL
  Filled 2023-07-23 (×3): qty 1

## 2023-07-23 MED ORDER — ACETYLCYSTEINE 20 % IN SOLN
4.0000 mL | Freq: Once | RESPIRATORY_TRACT | Status: AC
  Administered 2023-07-23: 4 mL via RESPIRATORY_TRACT
  Filled 2023-07-23: qty 4

## 2023-07-23 MED ORDER — IPRATROPIUM-ALBUTEROL 0.5-2.5 (3) MG/3ML IN SOLN
3.0000 mL | RESPIRATORY_TRACT | Status: DC
  Administered 2023-07-23: 3 mL via RESPIRATORY_TRACT
  Filled 2023-07-23: qty 3

## 2023-07-23 MED ORDER — METHYLPREDNISOLONE SODIUM SUCC 40 MG IJ SOLR
40.0000 mg | Freq: Two times a day (BID) | INTRAMUSCULAR | Status: AC
Start: 2023-07-23 — End: 2023-07-23
  Administered 2023-07-23 (×2): 40 mg via INTRAVENOUS
  Filled 2023-07-23 (×2): qty 1

## 2023-07-23 MED ORDER — ONDANSETRON HCL 4 MG/2ML IJ SOLN: 4.0000 mg | Freq: Four times a day (QID) | INTRAMUSCULAR | Status: DC | PRN

## 2023-07-23 MED ORDER — FUROSEMIDE 10 MG/ML IJ SOLN
40.0000 mg | Freq: Two times a day (BID) | INTRAMUSCULAR | Status: DC
  Administered 2023-07-23 – 2023-07-24 (×2): 40 mg via INTRAVENOUS
  Filled 2023-07-23 (×2): qty 4

## 2023-07-23 MED ORDER — GUAIFENESIN-DM 100-10 MG/5ML PO SYRP
5.0000 mL | ORAL_SOLUTION | ORAL | Status: DC | PRN
  Administered 2023-07-23 – 2023-07-31 (×7): 5 mL via ORAL
  Filled 2023-07-23 (×7): qty 10

## 2023-07-23 MED ORDER — INSULIN ASPART 100 UNIT/ML IJ SOLN
0.0000 [IU] | Freq: Three times a day (TID) | INTRAMUSCULAR | Status: DC
  Administered 2023-07-23: 3 [IU] via SUBCUTANEOUS
  Administered 2023-07-23: 15 [IU] via SUBCUTANEOUS
  Administered 2023-07-23: 20 [IU] via SUBCUTANEOUS
  Administered 2023-07-24: 15 [IU] via SUBCUTANEOUS
  Administered 2023-07-24: 4 [IU] via SUBCUTANEOUS
  Administered 2023-07-24: 3 [IU] via SUBCUTANEOUS
  Administered 2023-07-25: 11 [IU] via SUBCUTANEOUS
  Administered 2023-07-25 (×2): 4 [IU] via SUBCUTANEOUS
  Administered 2023-07-26 (×2): 11 [IU] via SUBCUTANEOUS
  Administered 2023-07-26: 7 [IU] via SUBCUTANEOUS
  Administered 2023-07-27 (×2): 11 [IU] via SUBCUTANEOUS
  Administered 2023-07-28: 4 [IU] via SUBCUTANEOUS
  Administered 2023-07-28 – 2023-07-29 (×3): 3 [IU] via SUBCUTANEOUS
  Administered 2023-07-29 (×2): 4 [IU] via SUBCUTANEOUS
  Administered 2023-07-30: 11 [IU] via SUBCUTANEOUS
  Administered 2023-07-30: 3 [IU] via SUBCUTANEOUS
  Administered 2023-07-30: 4 [IU] via SUBCUTANEOUS
  Administered 2023-07-31 (×2): 3 [IU] via SUBCUTANEOUS
  Administered 2023-07-31: 7 [IU] via SUBCUTANEOUS
  Administered 2023-08-01: 4 [IU] via SUBCUTANEOUS
  Administered 2023-08-01: 11 [IU] via SUBCUTANEOUS
  Administered 2023-08-02: 7 [IU] via SUBCUTANEOUS
  Administered 2023-08-02: 3 [IU] via SUBCUTANEOUS
  Administered 2023-08-02: 4 [IU] via SUBCUTANEOUS
  Administered 2023-08-03: 3 [IU] via SUBCUTANEOUS
  Administered 2023-08-03: 4 [IU] via SUBCUTANEOUS
  Filled 2023-07-23 (×33): qty 1

## 2023-07-23 MED ORDER — DAPAGLIFLOZIN PROPANEDIOL 10 MG PO TABS
10.0000 mg | ORAL_TABLET | Freq: Every day | ORAL | Status: DC
  Administered 2023-07-23 – 2023-08-03 (×12): 10 mg via ORAL
  Filled 2023-07-23 (×12): qty 1

## 2023-07-23 MED ORDER — ATORVASTATIN CALCIUM 20 MG PO TABS
40.0000 mg | ORAL_TABLET | Freq: Every day | ORAL | Status: DC
  Administered 2023-07-23 – 2023-07-24 (×2): 40 mg via ORAL
  Filled 2023-07-23 (×2): qty 2

## 2023-07-23 MED ORDER — PREDNISONE 20 MG PO TABS
40.0000 mg | ORAL_TABLET | Freq: Every day | ORAL | Status: DC
  Filled 2023-07-23: qty 2

## 2023-07-23 MED ORDER — METOPROLOL SUCCINATE ER 25 MG PO TB24
25.0000 mg | ORAL_TABLET | Freq: Every day | ORAL | Status: DC
  Administered 2023-07-23 – 2023-08-03 (×12): 25 mg via ORAL
  Filled 2023-07-23 (×12): qty 1

## 2023-07-23 MED ORDER — ONDANSETRON HCL 4 MG PO TABS: 4.0000 mg | ORAL_TABLET | Freq: Four times a day (QID) | ORAL | Status: DC | PRN

## 2023-07-23 MED ORDER — INSULIN GLARGINE-YFGN 100 UNIT/ML ~~LOC~~ SOLN
20.0000 [IU] | Freq: Every day | SUBCUTANEOUS | Status: DC
  Administered 2023-07-23 – 2023-07-25 (×3): 20 [IU] via SUBCUTANEOUS
  Filled 2023-07-23 (×3): qty 0.2

## 2023-07-23 MED ORDER — LOSARTAN POTASSIUM 50 MG PO TABS
50.0000 mg | ORAL_TABLET | Freq: Every day | ORAL | Status: DC
  Administered 2023-07-23 – 2023-07-24 (×2): 50 mg via ORAL
  Filled 2023-07-23 (×2): qty 1

## 2023-07-23 MED ORDER — ASPIRIN 81 MG PO TBEC
81.0000 mg | DELAYED_RELEASE_TABLET | Freq: Every day | ORAL | Status: DC
  Administered 2023-07-23 – 2023-08-03 (×12): 81 mg via ORAL
  Filled 2023-07-23 (×11): qty 1

## 2023-07-23 MED ORDER — METHYLPREDNISOLONE SODIUM SUCC 125 MG IJ SOLR
125.0000 mg | Freq: Once | INTRAMUSCULAR | Status: AC
  Administered 2023-07-23: 125 mg via INTRAVENOUS
  Filled 2023-07-23: qty 2

## 2023-07-23 MED ORDER — HYDROCOD POLI-CHLORPHE POLI ER 10-8 MG/5ML PO SUER
5.0000 mL | Freq: Two times a day (BID) | ORAL | Status: DC
  Administered 2023-07-23 (×2): 5 mL via ORAL
  Filled 2023-07-23 (×3): qty 5

## 2023-07-23 MED ORDER — IPRATROPIUM-ALBUTEROL 0.5-2.5 (3) MG/3ML IN SOLN
3.0000 mL | Freq: Once | RESPIRATORY_TRACT | Status: AC
  Administered 2023-07-23: 3 mL via RESPIRATORY_TRACT
  Filled 2023-07-23: qty 3

## 2023-07-23 MED ORDER — ACETAMINOPHEN 650 MG RE SUPP: 650.0000 mg | Freq: Four times a day (QID) | RECTAL | Status: DC | PRN

## 2023-07-23 MED ORDER — IPRATROPIUM-ALBUTEROL 0.5-2.5 (3) MG/3ML IN SOLN
3.0000 mL | Freq: Four times a day (QID) | RESPIRATORY_TRACT | Status: DC
  Administered 2023-07-23 (×2): 3 mL via RESPIRATORY_TRACT
  Filled 2023-07-23 (×2): qty 3

## 2023-07-23 MED ORDER — INSULIN ASPART 100 UNIT/ML IJ SOLN
0.0000 [IU] | Freq: Every day | INTRAMUSCULAR | Status: DC
  Administered 2023-07-28 – 2023-07-29 (×2): 3 [IU] via SUBCUTANEOUS
  Administered 2023-08-02: 4 [IU] via SUBCUTANEOUS
  Filled 2023-07-23 (×5): qty 1

## 2023-07-23 MED ORDER — CEFTRIAXONE SODIUM 1 G IJ SOLR
1.0000 g | Freq: Every day | INTRAMUSCULAR | Status: AC
  Administered 2023-07-23 – 2023-07-24 (×2): 1 g via INTRAVENOUS
  Filled 2023-07-23 (×2): qty 10

## 2023-07-23 MED ORDER — FUROSEMIDE 10 MG/ML IJ SOLN
60.0000 mg | Freq: Once | INTRAMUSCULAR | Status: AC
  Administered 2023-07-23: 60 mg via INTRAVENOUS
  Filled 2023-07-23: qty 8

## 2023-07-23 NOTE — Consult Note (Signed)
Cody Alexander is a 67 y.o. male  [id number]  Primary Cardiologist: Adrian Blackwater Reason for Consultation: CHF  HPI: This is a 67 year old white male with a past medical he has history of congestive heart failure due to HFpEF who is noncompliant with follow-up in the office and actually is not really followed by me.  He goes to [facility] intermittently.  He presented with shortness of breath and near syncope associated with cough and wheezing.   Review of Systems: No chest pain no orthopnea or PND   Past Medical History:  Diagnosis Date   CHF (congestive heart failure) (HCC)    COPD (chronic obstructive pulmonary disease) (HCC)    Diabetes mellitus without complication (HCC)    Hypertension    Neuropathy     Medications Prior to Admission  Medication Sig Dispense Refill   albuterol (PROVENTIL HFA) 108 (90 Base) MCG/ACT inhaler Inhale 2 puffs into the lungs every 4 (four) hours as needed. 6.7 g 0   ASPIRIN EC 81 MG EC tablet Take 81 mg by mouth daily.     atorvastatin (LIPITOR) 40 MG tablet Take 40 mg by mouth daily.     budesonide-formoterol (SYMBICORT) 160-4.5 MCG/ACT inhaler Inhale 2 puffs into the lungs 2 (two) times daily. 10.2 g 2   insulin lispro (HUMALOG) 100 UNIT/ML KwikPen Inject 10 Units into the skin 3 (three) times daily with meals. 27 mL 0   ipratropium-albuterol (DUONEB) 0.5-2.5 (3) MG/3ML SOLN Inhale 3 mLs by nebulization once every 6 (six) hours as needed. 120 mL 1   LANTUS SOLOSTAR 100 UNIT/ML Solostar Pen Inject 20 Units into the skin daily. 18 mL 0   losartan (COZAAR) 50 MG tablet Take 1 tablet (50 mg total) by mouth daily. 90 tablet 0   magnesium oxide (MAG-OX) 400 MG tablet Take 1 tablet by mouth 2 (two) times daily.     metoprolol succinate (TOPROL-XL) 25 MG 24 hr tablet Take 1 tablet (25 mg total) by mouth daily. 90 tablet 0   torsemide (DEMADEX) 20 MG tablet Take 2 tablets (40 mg total) by mouth daily. 60 tablet 2   metFORMIN (GLUCOPHAGE) 1000 MG  tablet Take 1 tablet (1,000 mg total) by mouth daily. (Patient not taking: Reported on 07/23/2023) 90 tablet 0      arformoterol  15 mcg Nebulization BID   aspirin EC  81 mg Oral Daily   atorvastatin  40 mg Oral Daily   chlorpheniramine-HYDROcodone  5 mL Oral Q12H   enoxaparin (LOVENOX) injection  70 mg Subcutaneous Q24H   furosemide  40 mg Intravenous Q12H   guaiFENesin  600 mg Oral BID   insulin aspart  0-20 Units Subcutaneous TID WC   insulin aspart  0-5 Units Subcutaneous QHS   insulin aspart  8 Units Subcutaneous TID WC   insulin glargine-yfgn  20 Units Subcutaneous Daily   ipratropium-albuterol  3 mL Nebulization BID   losartan  50 mg Oral Daily   magnesium oxide  400 mg Oral BID   methylPREDNISolone (SOLU-MEDROL) injection  40 mg Intravenous Q12H   Followed by   Melene Muller ON 07/24/2023] predniSONE  40 mg Oral Q breakfast   metoprolol succinate  25 mg Oral Daily    Infusions:  cefTRIAXone (ROCEPHIN)  IV Stopped (07/23/23 0442)    Allergies  Allergen Reactions   Erythromycin Anaphylaxis and Swelling    Had eye swelling with erythromycin during a time when he had perf ear drum.  Tolerated azithromycin.    Social  History   Socioeconomic History   Marital status: Single    Spouse name: Not on file   Number of children: 1   Years of education: Not on file   Highest education level: 12th grade  Occupational History   Occupation: Retired  Tobacco Use   Smoking status: Former    Types: Cigarettes   Smokeless tobacco: Never  Vaping Use   Vaping status: Never Used  Substance and Sexual Activity   Alcohol use: No   Drug use: Never   Sexual activity: Not on file  Other Topics Concern   Not on file  Social History Narrative   Not on file   Social Determinants of Health   Financial Resource Strain: Low Risk  (07/23/2023)   Overall Financial Resource Strain (CARDIA)    Difficulty of Paying Living Expenses: Not very hard  Food Insecurity: No Food Insecurity  (07/23/2023)   Hunger Vital Sign    Worried About Running Out of Food in the Last Year: Never true    Ran Out of Food in the Last Year: Never true  Transportation Needs: Unmet Transportation Needs (07/23/2023)   PRAPARE - Administrator, Civil Service (Medical): Yes    Lack of Transportation (Non-Medical): No  Physical Activity: Inactive (07/02/2021)   Received from [facility], [facility]   Exercise Vital Sign    Days of Exercise per Week: 0 days    Minutes of Exercise per Session: 0 min  Stress: Stress Concern Present (10/21/2022)   Received from [facility], [facility] of Occupational Health - Occupational Stress Questionnaire    Feeling of Stress : To some extent  Social Connections: Moderately Isolated (10/21/2022)   Received from [facility], [facility]   Social Connection and Isolation Panel [NHANES]    Frequency of Communication with Friends and Family: More than three times a week    Frequency of Social Gatherings with Friends and Family: More than three times a week    Attends Religious Services: 1 to 4 times per year    Active Member of [employer] or Organizations: No    Attends Banker Meetings: Never    Marital Status: Divorced  Catering manager Violence: Not At Risk (07/23/2023)   Humiliation, Afraid, Rape, and Kick questionnaire    Fear of Current or Ex-Partner: No    Emotionally Abused: No    Physically Abused: No    Sexually Abused: No    Family History  Problem Relation Age of Onset   Stroke Mother    Hypertension Father     PHYSICAL EXAM: Vitals:   07/23/23 1200 07/23/23 1242  BP:    Pulse: 79   Resp: 18   Temp:  98.7 F (37.1 C)  SpO2: 92%      Intake/Output Summary (Last 24 hours) at 07/23/2023 1330 Last data filed at 07/23/2023 1131 Gross per 24 hour  Intake 340 ml  Output 2800 ml  Net -2460 ml    General:  Well appearing. No respiratory difficulty HEENT: normal Neck:  supple. no JVD. Carotids 2+ bilat; no bruits. No lymphadenopathy or thryomegaly appreciated. Cor: PMI nondisplaced. Regular rate & rhythm. No rubs, gallops or murmurs. Lungs: clear Abdomen: soft, nontender, nondistended. No hepatosplenomegaly. No bruits or masses. Good bowel sounds. Extremities: no cyanosis, clubbing, rash, edema Neuro: alert & oriented x 3, cranial nerves grossly intact. moves all 4 extremities w/o difficulty. Affect pleasant.  ECG: Normal  sinus rhythm with nonspecific ST-T changes  Results for orders placed or performed during the hospital encounter of 07/23/23 (from the past 24 hour(s))  Lactic acid, plasma     Status: None   Collection Time: 07/23/23 12:25 AM  Result Value Ref Range   Lactic Acid, Venous 1.2 0.5 - 1.9 mmol/L  Comprehensive metabolic panel     Status: Abnormal   Collection Time: 07/23/23 12:25 AM  Result Value Ref Range   Sodium 135 135 - 145 mmol/L   Potassium 4.3 3.5 - 5.1 mmol/L   Chloride 95 (L) 98 - 111 mmol/L   CO2 32 22 - 32 mmol/L   Glucose, Bld 271 (H) 70 - 99 mg/dL   BUN 21 8 - 23 mg/dL   Creatinine, Ser 0.98 0.61 - 1.24 mg/dL   Calcium 8.8 (L) 8.9 - 10.3 mg/dL   Total Protein 6.5 6.5 - 8.1 g/dL   Albumin 3.4 (L) 3.5 - 5.0 g/dL   AST 21 15 - 41 U/L   ALT 21 0 - 44 U/L   Alkaline Phosphatase 55 38 - 126 U/L   Total Bilirubin 0.7 <1.2 mg/dL   GFR, Estimated >11 >91 mL/min   Anion gap 8 5 - 15  CBC with Differential     Status: Abnormal   Collection Time: 07/23/23 12:25 AM  Result Value Ref Range   WBC 9.4 4.0 - 10.5 K/uL   RBC 4.18 (L) 4.22 - 5.81 MIL/uL   Hemoglobin 12.0 (L) 13.0 - 17.0 g/dL   HCT 47.8 (L) 29.5 - 62.1 %   MCV 90.0 80.0 - 100.0 fL   MCH 28.7 26.0 - 34.0 pg   MCHC 31.9 30.0 - 36.0 g/dL   RDW 30.8 65.7 - 84.6 %   Platelets 257 150 - 400 K/uL   nRBC 0.0 0.0 - 0.2 %   Neutrophils Relative % 69 %   Neutro Abs 6.6 1.7 - 7.7 K/uL   Lymphocytes Relative 20 %   Lymphs Abs 1.8 0.7 - 4.0 K/uL   Monocytes Relative 8 %    Monocytes Absolute 0.7 0.1 - 1.0 K/uL   Eosinophils Relative 1 %   Eosinophils Absolute 0.1 0.0 - 0.5 K/uL   Basophils Relative 1 %   Basophils Absolute 0.1 0.0 - 0.1 K/uL   Immature Granulocytes 1 %   Abs Immature Granulocytes 0.12 (H) 0.00 - 0.07 K/uL  Resp panel by RT-PCR (RSV, Flu A&B, Covid) Anterior Nasal Swab     Status: None   Collection Time: 07/23/23 12:25 AM   Specimen: Anterior Nasal Swab  Result Value Ref Range   SARS Coronavirus 2 by RT PCR NEGATIVE NEGATIVE   Influenza A by PCR NEGATIVE NEGATIVE   Influenza B by PCR NEGATIVE NEGATIVE   Resp Syncytial Virus by PCR NEGATIVE NEGATIVE  Troponin I (High Sensitivity)     Status: Abnormal   Collection Time: 07/23/23 12:25 AM  Result Value Ref Range   Troponin I (High Sensitivity) 89 (H) <18 ng/L  Brain natriuretic peptide     Status: Abnormal   Collection Time: 07/23/23 12:25 AM  Result Value Ref Range   B Natriuretic Peptide 208.8 (H) 0.0 - 100.0 pg/mL  Blood culture (routine x 2)     Status: None (Preliminary result)   Collection Time: 07/23/23 12:25 AM   Specimen: BLOOD  Result Value Ref Range   Specimen Description BLOOD BLOOD LEFT ARM    Special Requests      BOTTLES DRAWN AEROBIC AND  ANAEROBIC Blood Culture results may not be optimal due to an excessive volume of blood received in culture bottles   Culture      NO GROWTH < 12 HOURS Performed at [facility], [address] [place], [place] [address]    Report Status PENDING   Lactic acid, plasma     Status: Abnormal   Collection Time: 07/23/23  6:25 AM  Result Value Ref Range   Lactic Acid, Venous 2.2 (HH) 0.5 - 1.9 mmol/L  Troponin I (High Sensitivity)     Status: Abnormal   Collection Time: 07/23/23  6:25 AM  Result Value Ref Range   Troponin I (High Sensitivity) 84 (H) <18 ng/L  Hemoglobin A1c     Status: Abnormal   Collection Time: 07/23/23  6:25 AM  Result Value Ref Range   Hgb A1c MFr Bld 10.5 (H) 4.8 - 5.6 %   Mean Plasma Glucose  254.65 mg/dL  Glucose, capillary     Status: Abnormal   Collection Time: 07/23/23  8:04 AM  Result Value Ref Range   Glucose-Capillary 318 (H) 70 - 99 mg/dL  Lactic acid, plasma     Status: None   Collection Time: 07/23/23  8:19 AM  Result Value Ref Range   Lactic Acid, Venous 1.2 0.5 - 1.9 mmol/L  Glucose, capillary     Status: Abnormal   Collection Time: 07/23/23 12:12 PM  Result Value Ref Range   Glucose-Capillary 412 (H) 70 - 99 mg/dL   DG Chest Port 1 View  Result Date: 07/23/2023 CLINICAL DATA:  Possible sepsis respiratory distress EXAM: PORTABLE CHEST 1 VIEW COMPARISON:  06/17/2023 FINDINGS: Cardiomegaly with mild central congestion. No acute airspace disease or effusion. No pneumothorax. Mild atherosclerosis IMPRESSION: Cardiomegaly with mild central congestion. Electronically Signed   By: Jasmine Pang M.D.   On: 07/23/2023 01:21     ASSESSMENT AND PLAN: History of congestive heart failure due to HFpEF with normal ejection fraction on echocardiogram in May 2024 but had diastolic dysfunction, presented with shortness of breath which appears to be COPD exacerbation.  Chest x-ray showed some congestion thus I was asked to evaluate the patient.  Patient does have a history of coronary artery disease with 35% disease in mid LAD in 2019 on cardiac cath.  He denies any chest pain.  Troponins are only borderline elevated.  EKG has no acute changes.  Advise adding Marcelline Deist and continue Lasix 40 mg twice daily.  Continue losartan and metoprolol.  This no further recommendation other than adding Marcelline Deist as far as Risk analyst.  Patient has not been following up in my office and is currently followed at [facility].  Most of the patient's symptoms appear to be related to COPD exacerbation since saturation is only 87 and needing home oxygen.  Let me know if there is any further need for managing this patient.  Thank you very much for referral  [facility]

## 2023-07-23 NOTE — Assessment & Plan Note (Signed)
Likely secondary to both COPD and CHF exacerbation Currently on BiPAP, to wean as tolerated Treat COPD and CHF-outlined on the respective problem

## 2023-07-23 NOTE — Progress Notes (Signed)
Patient admitted to the unit and placed on telemetry. A skin assessment was completed by myself and [place]. Patient has two large cyst in his spine area, pink/blanchable skin in the buttocks area, dry scaly feet, and redness from the knees down bilaterally on both legs.No other skin issues noted at this time.

## 2023-07-23 NOTE — Progress Notes (Signed)
Brief hospitalist update note.  This is a nonbillable note.  Please see same-day H&P for full billable details.  Briefly, this is a 67 year old male with known history of diastolic congestive heart failure and COPD who presents with progressive shortness of breath and mild bilateral lower extremity weakness.  Shortness of breath is slowly improving.  I suspect this is secondary to more decompensated COPD than CHF.  Mild pulmonary edema noted on chest imaging.  Trace lower extremity edema, BNP minimally elevated.  Will continue to treat for concomitant CHF and COPD.  Continue diuresis to maintain net euvolemia or mild net negative status.  IV steroids, bronchodilators.  Wean oxygen as tolerated.  Nocturnal CPAP.  Lolita Patella MD  No charge

## 2023-07-23 NOTE — Progress Notes (Addendum)
Heart Failure Nurse Navigator Progress Note  PCP: [employer] PCP-Cardiologist: Lorretta Harp, MD [facility]) Admission Diagnosis: Shortness of breath Admitted from: Home via EMS  Presentation:   Cody Alexander presented with respiratory distress on BiPAP. C/O feeling sick x 1 week.He had been on 3-4 L of oxygen and bumped to 5 L.  He also had a thick cough, fever and chills.  BNP 208.8. Pt did have a grandchild that visited prior to his symptoms who was sick with a respiratory illness.  ECHO/ LVEF: 01/07/23 65-70% ECHO ordered 07/23/23-no results yet  Clinical Course:  Past Medical History:  Diagnosis Date   CHF (congestive heart failure) (HCC)    COPD (chronic obstructive pulmonary disease) (HCC)    Diabetes mellitus without complication (HCC)    Hypertension    Neuropathy      Social History   Socioeconomic History   Marital status: Single    Spouse name: Not on file   Number of children: 1   Years of education: Not on file   Highest education level: 12th grade  Occupational History   Occupation: Retired  Tobacco Use   Smoking status: Former    Types: Cigarettes   Smokeless tobacco: Never  Vaping Use   Vaping status: Never Used  Substance and Sexual Activity   Alcohol use: No   Drug use: Never   Sexual activity: Not on file  Other Topics Concern   Not on file  Social History Narrative   Not on file   Social Determinants of Health   Financial Resource Strain: Low Risk  (07/23/2023)   Overall Financial Resource Strain (CARDIA)    Difficulty of Paying Living Expenses: Not very hard  Food Insecurity: No Food Insecurity (07/23/2023)   Hunger Vital Sign    Worried About Running Out of Food in the Last Year: Never true    Ran Out of Food in the Last Year: Never true  Transportation Needs: Unmet Transportation Needs (07/23/2023)   PRAPARE - Administrator, Civil Service (Medical): Yes    Lack of  Transportation (Non-Medical): No  Physical Activity: Inactive (07/02/2021)   Received from [facility], [facility]   Exercise Vital Sign    Days of Exercise per Week: 0 days    Minutes of Exercise per Session: 0 min  Stress: Stress Concern Present (10/21/2022)   Received from [facility], [facility] of Occupational Health - Occupational Stress Questionnaire    Feeling of Stress : To some extent  Social Connections: Moderately Isolated (10/21/2022)   Received from [facility], [facility]   Social Connection and Isolation Panel [NHANES]    Frequency of Communication with Friends and Family: More than three times a week    Frequency of Social Gatherings with Friends and Family: More than three times a week    Attends Religious Services: 1 to 4 times per year    Active Member of [employer] or Organizations: No    Attends Engineer, structural: Never    Marital Status: Divorced   Water engineer and Provision:  Detailed education and instructions provided on heart failure disease management including the following:  Signs and symptoms of Heart Failure When to call the physician Importance of daily weights Low sodium diet Fluid restriction Medication management Anticipated future follow-up appointments  Patient education given on each of the above topics.  Patient acknowledges understanding via teach back  method and acceptance of all instructions.  Education Materials:  "Living Better With Heart Failure" Booklet, HF zone tool, & Daily Weight Tracker Tool.  Patient has scale at home: Yes Patient has pill box at home: No-will give him one prior to discharge    Pt given discharge handouts for [facility], [employer] in [facility] [employer].  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  He has concerns if something happens to her that he will not have a home.  Asked for information for Meals on Wheels-reached out to [facility] SW who will provide information in his AVS. Transportation needs. Demonstrates medication adherence: Yes Primary Language: English Literacy level: Writing, Reading & Comprehension  Barriers of Care:   Diet & Fluid Restrictions Daily Weights Medication Compliance Transportation Needs   Considerations/Referrals:   Referral made to Heart Failure Pharmacist Stewardship: Yes Referral made to CSW/NCM TOC: Yes-for meals on wheels.  Referral made to Heart & Vascular TOC clinic: Yes-appointment scheduled 08/08/23 @ 11:00 am.  Items for Follow-up on DC/TOC: Diet & Fluid Restrictions Daily Weights Medication Compliance Continued Heart Failure Education  Roxy Horseman, RN, BSN [facility] Heart Failure Navigator Secure Chat Only

## 2023-07-23 NOTE — Progress Notes (Signed)
PHARMACIST - PHYSICIAN COMMUNICATION  CONCERNING:  Enoxaparin (Lovenox) for DVT Prophylaxis    RECOMMENDATION: Patient was prescribed enoxaprin 40mg  q24 hours for VTE prophylaxis.   Filed Weights   07/23/23 0514  Weight: (!) 140.1 kg (308 lb 13.8 oz)    Body mass index is 41.89 kg/m.  Estimated Creatinine Clearance: 91.7 mL/min (by C-G formula based on SCr of 1.15 mg/dL).   Based on [facility] policy patient is candidate for enoxaparin 0.5mg /kg TBW SQ every 24 hours based on BMI being >30.  DESCRIPTION: Pharmacy has adjusted enoxaparin dose per [facility] policy.  Patient is now receiving enoxaparin 0.5 mg/kg every 24 hours   Otelia Sergeant, PharmD, [facility] 07/23/2023 5:17 AM

## 2023-07-23 NOTE — Progress Notes (Signed)
   07/23/23 1200  Spiritual Encounters  Type of Visit Initial  Care provided to: Patient  Referral source Patient request  Reason for visit Routine spiritual support  OnCall Visit Yes  Interventions  Spiritual Care Interventions Made Established relationship of care and support;Compassionate presence;Reflective listening;Prayer;Encouragement  Intervention Outcomes  Outcomes Connection to spiritual care;Awareness of support;Reduced anxiety  Spiritual Care Plan  Spiritual Care Issues Still Outstanding No further spiritual care needs at this time (see row info)   Chaplain spiritual support services remain available as the need arises.

## 2023-07-23 NOTE — H&P (Signed)
History and Physical    Patient: Cody Alexander ZOX:[id number] DOB: 1956/06/17 DOA: 07/23/2023 DOS: the patient was seen and examined on 07/23/2023 PCP: [facility]  Patient coming from: Home  Chief Complaint:  Chief Complaint  Patient presents with   Respiratory Distress    Hx of COPD/ CHF, States that he has been having increased fluid in lungs and legs. Baseline O2@3 /4L, had to increase O2 to 5L. Feeling of increased congestion and coughing.     HPI: Cody Alexander is a 67 y.o. male with medical history significant for Diastolic CHF, COPD on 3-4L O2, CAD, HTN, T2DM, OSA on CPAP, morbid obesity, hospitalized 1 month ago from 10/17 to 10/24 with respiratory failure secondary to COPD and CHF, initially admitted to the ICU, who presents to the ED with a similar presentation of acute respiratory distress, arriving on CPAP by EMS.  Patient states he has had a congested cough for the past several days and now it hurts his chest to cough.  His breathing progressively got worse in spite of turning up his oxygen to 5 L.  He also has increasing lower extremity swelling in spite of being compliant with his Lasix. ED course and data review: BP 161/110 on arrival, afebrile, pulse 95 and O2 sat 96% on CPAP.  He was transitioned to BiPAP on arrival Labs: WBC 9400 with lactic acid 1.2, hemoglobin 12, glucose 271, troponin 89 and BNP pending Respiratory viral panel negative for COVID flu and RSV EK G,pending Chest x-ray cardiomegaly with mild central congestion Patient treated with DuoNebs, Lasix, Mucomyst and antitussives Hospitalist consulted for admission.    Past Medical History:  Diagnosis Date   CHF (congestive heart failure) (HCC)    COPD (chronic obstructive pulmonary disease) (HCC)    Diabetes mellitus without complication (HCC)    Hypertension    Neuropathy    Past Surgical History:  Procedure Laterality Date   LEFT HEART CATH AND CORONARY ANGIOGRAPHY Right  02/27/2018   Procedure: LEFT HEART CATH AND CORONARY ANGIOGRAPHY;  Surgeon: Laurier Nancy, MD;  Location: [facility] INVASIVE CV LAB;  Service: Cardiovascular;  Laterality: Right;   Social History:  reports that he has quit smoking. His smoking use included cigarettes. He has never used smokeless tobacco. He reports that he does not drink alcohol and does not use drugs.  Allergies  Allergen Reactions   Erythromycin Anaphylaxis and Swelling    Had eye swelling with erythromycin during a time when he had perf ear drum.  Tolerated azithromycin.    Family History  Problem Relation Age of Onset   Stroke Mother    Hypertension Father     Prior to Admission medications   Medication Sig Start Date End Date Taking? Authorizing Provider  ASPIRIN EC 81 MG EC tablet Take 81 mg by mouth daily. 11/15/22   [provider]  atorvastatin (LIPITOR) 40 MG tablet Take 40 mg by mouth daily.    [provider]  budesonide-formoterol (SYMBICORT) 160-4.5 MCG/ACT inhaler Inhale 2 puffs into the lungs 2 (two) times daily. 06/19/23   [name], MD  insulin lispro (HUMALOG) 100 UNIT/ML KwikPen Inject 10 Units into the skin 3 (three) times daily with meals. 06/19/23 09/17/23  [name], MD  ipratropium-albuterol (DUONEB) 0.5-2.5 (3) MG/3ML SOLN Inhale 3 mLs by nebulization once every 6 (six) hours as needed. 06/19/23   [name], MD  LANTUS SOLOSTAR 100 UNIT/ML Solostar Pen Inject 20 Units into the skin daily. 06/19/23 09/17/23  [name], MD  losartan (COZAAR) 50 MG tablet Take 1 tablet (50 mg total) by mouth daily. 06/19/23 09/17/23  [name], MD  magnesium oxide (MAG-OX) 400 MG tablet Take 1 tablet by mouth 2 (two) times daily. 10/24/22   [provider]  metFORMIN (GLUCOPHAGE) 1000 MG tablet Take 1 tablet (1,000 mg total) by mouth daily. 06/19/23 09/17/23  [name], MD  metoprolol succinate (TOPROL-XL) 25 MG 24 hr tablet Take 1 tablet (25 mg total) by mouth daily. 06/19/23 09/17/23  [name],  MD  albuterol (PROVENTIL HFA) 108 (90 Base) MCG/ACT inhaler Inhale 2 puffs into the lungs every 4 (four) hours as needed. 06/19/23   [name], MD  torsemide (DEMADEX) 20 MG tablet Take 2 tablets (40 mg total) by mouth daily. 06/19/23 09/17/23  [name], MD    Physical Exam: Vitals:   07/23/23 0020 07/23/23 0100  BP: (!) 161/110 (!) 153/95  Pulse: 95 84  Resp:  16  Temp: 98.6 F (37 C)   TempSrc: Oral   SpO2: 96% 98%   Physical Exam Vitals and nursing note reviewed.  Constitutional:      Comments: Patient on BiPAP, speaking in short sentences, coughing a lot  HENT:     Head: Normocephalic and atraumatic.  Cardiovascular:     Rate and Rhythm: Regular rhythm.     Heart sounds: Normal heart sounds.  Pulmonary:     Effort: Tachypnea present.     Breath sounds: Wheezing and rales present.  Abdominal:     Palpations: Abdomen is soft.     Tenderness: There is no abdominal tenderness.  Musculoskeletal:     Right lower leg: 2+ Edema present.     Left lower leg: 2+ Edema present.  Skin:    Comments: Venous stasis rash with redness  Neurological:     Mental Status: Mental status is at baseline.     Labs on Admission: I have personally reviewed following labs and imaging studies  CBC: Recent Labs  Lab 07/23/23 0025  WBC 9.4  NEUTROABS 6.6  HGB 12.0*  HCT 37.6*  MCV 90.0  PLT 257   Basic Metabolic Panel: Recent Labs  Lab 07/23/23 0025  NA 135  K 4.3  CL 95*  CO2 32  GLUCOSE 271*  BUN 21  CREATININE 1.15  CALCIUM 8.8*   GFR: CrCl cannot be calculated (Unknown ideal weight.). Liver Function Tests: Recent Labs  Lab 07/23/23 0025  AST 21  ALT 21  ALKPHOS 55  BILITOT 0.7  PROT 6.5  ALBUMIN 3.4*   No results for input(s): "LIPASE", "AMYLASE" in the last 168 hours. No results for input(s): "AMMONIA" in the last 168 hours. Coagulation Profile: No results for input(s): "INR", "PROTIME" in the last 168 hours. Cardiac Enzymes: No results for input(s):  "CKTOTAL", "CKMB", "CKMBINDEX", "TROPONINI" in the last 168 hours. BNP (last 3 results) No results for input(s): "PROBNP" in the last 8760 hours. HbA1C: No results for input(s): "HGBA1C" in the last 72 hours. CBG: No results for input(s): "GLUCAP" in the last 168 hours. Lipid Profile: No results for input(s): "CHOL", "HDL", "LDLCALC", "TRIG", "CHOLHDL", "LDLDIRECT" in the last 72 hours. Thyroid Function Tests: No results for input(s): "TSH", "T4TOTAL", "FREET4", "T3FREE", "THYROIDAB" in the last 72 hours. Anemia Panel: No results for input(s): "VITAMINB12", "FOLATE", "FERRITIN", "TIBC", "IRON", "RETICCTPCT" in the last 72 hours. Urine analysis:    Component Value Date/Time   COLORURINE AMBER (A) 01/05/2023 1652   APPEARANCEUR CLOUDY (A) 01/05/2023 1652   APPEARANCEUR Clear 08/20/2013 0849  LABSPEC 1.020 01/05/2023 1652   LABSPEC 1.014 08/20/2013 0849   PHURINE 5.5 01/05/2023 1652   GLUCOSEU 500 (A) 01/05/2023 1652   GLUCOSEU Negative 08/20/2013 0849   HGBUR LARGE (A) 01/05/2023 1652   BILIRUBINUR NEGATIVE 01/05/2023 1652   BILIRUBINUR Negative 08/20/2013 0849   KETONESUR TRACE (A) 01/05/2023 1652   PROTEINUR >300 (A) 01/05/2023 1652   NITRITE NEGATIVE 01/05/2023 1652   LEUKOCYTESUR MODERATE (A) 01/05/2023 1652   LEUKOCYTESUR Negative 08/20/2013 0849    Radiological Exams on Admission: DG Chest Port 1 View  Result Date: 07/23/2023 CLINICAL DATA:  Possible sepsis respiratory distress EXAM: PORTABLE CHEST 1 VIEW COMPARISON:  06/17/2023 FINDINGS: Cardiomegaly with mild central congestion. No acute airspace disease or effusion. No pneumothorax. Mild atherosclerosis IMPRESSION: Cardiomegaly with mild central congestion. Electronically Signed   By: Jasmine Pang M.D.   On: 07/23/2023 01:21     Data Reviewed: Relevant notes from primary care and specialist visits, past discharge summaries as available in EHR, including Care Everywhere. Prior diagnostic testing as pertinent to  current admission diagnoses Updated medications and problem lists for reconciliation ED course, including vitals, labs, imaging, treatment and response to treatment Triage notes, nursing and pharmacy notes and ED provider's notes Notable results as noted in HPI   Assessment and Plan: Acute on chronic respiratory failure with hypoxia and hypercapnia (HCC) Likely secondary to both COPD and CHF exacerbation Currently on BiPAP, to wean as tolerated Treat COPD and CHF-outlined on the respective problem  Acute on chronic diastolic CHF (congestive heart failure) (HCC) Hypertensive emergency BP 161/110 on arrival Chest x-ray showing pulmonary vascular congestion IV Lasix Continue spironolactone, metoprolol and ACE/ARB Daily weights with intake and output monitoring Last echo from 12/2022 showing EF 65 to 70% with G2 DD Will get repeat echo  COPD with acute exacerbation [facility]) Patient with a nonproductive but congested cough Scheduled and as needed DuoNebs, IV steroids Rocephin, guaifenesin Flutter valve. Supplemental oxygen  Elevated troponin CAD with history of stent Troponin elevated to 88, likely demand ischemia Continue home Isordil, metoprolol, lisinopril and atorvastatin  Uncontrolled type 2 diabetes mellitus with hyperglycemia, with long-term current use of insulin (HCC) Blood sugar over 217 Continue home basal insulin Sliding scale insulin coverage  OSA (obstructive sleep apnea) Currently on BiPAP  Obesity, Class III, BMI 40-49.9 (morbid obesity) (HCC) Complicating factor to overall prognosis and care     DVT prophylaxis: Lovenox  Consults: Cardiology  Advance Care Planning:   Code Status: Prior   Family Communication: none  Disposition Plan: Back to previous home environment  Severity of Illness: The appropriate patient status for this patient is INPATIENT. Inpatient status is judged to be reasonable and necessary in order to provide the required intensity  of service to ensure the patient's safety. The patient's presenting symptoms, physical exam findings, and initial radiographic and laboratory data in the context of their chronic comorbidities is felt to place them at high risk for further clinical deterioration. Furthermore, it is not anticipated that the patient will be medically stable for discharge from the hospital within 2 midnights of admission.   * I certify that at the point of admission it is my clinical judgment that the patient will require inpatient hospital care spanning beyond 2 midnights from the point of admission due to high intensity of service, high risk for further deterioration and high frequency of surveillance required.*  Author: Andris Baumann, MD 07/23/2023 2:16 AM  For on call review [email or web address].

## 2023-07-23 NOTE — ED Provider Notes (Signed)
[facility] Provider Note    Event Date/Time   First MD Initiated Contact with Patient 07/23/23 0019     (approximate)   History   Shortness of breath   HPI  Cody Alexander is a 67 y.o. male  who presents to the emergency department today via EMS as emergency traffic because of concerns for respiratory distress.  The patient history is somewhat limited secondary to respiratory distress and being on BiPAP.  However he does state that he has been feeling sick for about the past week.  He is normally on 3 to 4 L of oxygen but has bumped that up to 5 L.  Additionally he has switched to his home BiPAP rather than nasal cannula given continued shortness of breath.  This has been accompanied by a thick cough.  States he also feels like he has had some fevers and chills.  Did have a grandchild come by prior to his symptoms who was sick with respiratory illness.     Physical Exam   Triage Vital Signs: ED Triage Vitals [07/23/23 0019]  Encounter Vitals Group     BP      Systolic BP Percentile      Diastolic BP Percentile      Pulse      Resp      Temp      Temp src      SpO2      Weight      Height      Head Circumference      Peak Flow      Pain Score 3     Pain Loc      Pain Education      Exclude from Growth Chart     Most recent vital signs: There were no vitals filed for this visit.  General: Awake, alert, oriented. CV:  Good peripheral perfusion. Regular rate and rhythm. Resp:  Increased work of breathing. Poor air movement diffusely with wheezing. Tachypnea. Abd:  Swollen, non tender abdomen. Other:  Lower extremity edema.    ED Results / Procedures / Treatments   Labs (all labs ordered are listed, but only abnormal results are displayed) Labs Reviewed  COMPREHENSIVE METABOLIC PANEL - Abnormal; Notable for the following components:      Result Value   Chloride 95 (*)    Glucose, Bld 271 (*)    Calcium 8.8 (*)    Albumin 3.4 (*)     All other components within normal limits  CBC WITH DIFFERENTIAL/PLATELET - Abnormal; Notable for the following components:   RBC 4.18 (*)    Hemoglobin 12.0 (*)    HCT 37.6 (*)    Abs Immature Granulocytes 0.12 (*)    All other components within normal limits  TROPONIN I (HIGH SENSITIVITY) - Abnormal; Notable for the following components:   Troponin I (High Sensitivity) 89 (*)    All other components within normal limits  RESP PANEL BY RT-PCR (RSV, FLU A&B, COVID)  [id number]  CULTURE, BLOOD (ROUTINE X 2)  CULTURE, BLOOD (ROUTINE X 2)  LACTIC ACID, PLASMA  BRAIN NATRIURETIC PEPTIDE  LACTIC ACID, PLASMA  LACTIC ACID, PLASMA    RADIOLOGY I independently interpreted and visualized the CXR. My interpretation: No pneumonia Radiology interpretation:  IMPRESSION:  Cardiomegaly with mild central congestion.    PROCEDURES:  Critical Care performed: Yes  CRITICAL CARE Performed by: Phineas Semen   Total critical care time: 30 minutes  Critical care time was exclusive of  separately billable procedures and treating other patients.  Critical care was necessary to treat or prevent imminent or life-threatening deterioration.  Critical care was time spent personally by me on the following activities: development of treatment plan with patient and/or surrogate as well as nursing, discussions with consultants, evaluation of patient's response to treatment, examination of patient, obtaining history from patient or surrogate, ordering and performing treatments and interventions, ordering and review of laboratory studies, ordering and review of radiographic studies, pulse oximetry and re-evaluation of patient's condition.   Procedures    MEDICATIONS ORDERED IN ED: Medications  ipratropium-albuterol (DUONEB) 0.5-2.5 (3) MG/3ML nebulizer solution 3 mL (has no administration in time range)  acetylcysteine (MUCOMYST) 20 % nebulizer / oral solution 4 mL (has no administration in time range)      IMPRESSION / MDM / ASSESSMENT AND PLAN / ED COURSE  I reviewed the triage vital signs and the nursing notes.                              Differential diagnosis includes, but is not limited to, viral illness, pneumonia, COPD, CHF  Patient's presentation is most consistent with acute presentation with potential threat to life or bodily function.   The patient is on the cardiac monitor to evaluate for evidence of arrhythmia and/or significant heart rate changes.  Patient presented to the emergency department today because of concerns for shortness of breath.  Patient did come in on CPAP and was quickly transitioned to BiPAP.  Lungs with poor air movement and some wheezing.  Patient initially with lower extremity and abdominal swelling.  This time would have concern for BMP/COPD.  Will check blood work, chest x-ray.  Blood work with slight elevation of BNP and troponin.  Chest x-ray shows mild vascular congestion.  No pneumonia.  Will plan on admission.  Discussed with Dr. Para March with the hospitalist service.      FINAL CLINICAL IMPRESSION(S) / ED DIAGNOSES   Final diagnoses:  Shortness of breath    Note:  This document was prepared using Dragon voice recognition software and may include unintentional dictation errors.    Phineas Semen, MD 07/23/23 [phone]

## 2023-07-23 NOTE — TOC Initial Note (Signed)
Transition of Care [facility]) - Initial/Assessment Note    Patient Details  Name: Cody Alexander MRN: [id number] Date of Birth: [birth year]  [facility]) CM/SW Contact:    Cody Alexander Phone Number: 07/23/2023, 2:00 PM  Clinical Narrative:    Readmission prevention screen complete. PCP is Cody Foley, PA-C with [facility]. Patient reports he no longer has a car so he does not have transportation to appointments. CSW added transportation resources to his AVS. [facility] [place] No issues obtaining medications other than transportation. Patient lives home with his roommate and her daughter and daughter's boyfriend. Patient is working with [facility] DSS social worker to find alternate housing. With patient's permission, CSW notified the social worker Cody Alexander) that he is here. She will come see him on Monday if he is still here. Patient is active with [facility] for PT, OT, RN, aide. He has a RW at home as well as oxygen and a bipap through [facility]. His portable oxygen is in the room. No further concerns. CSW encouraged patient to contact CSW as needed. CSW will continue to follow patient for support and facilitate return home once stable. Patient stated he will likely need a cab at discharge.              Expected Discharge Plan: Home w Home Health Services Barriers to Discharge: Continued Medical Work up   Patient Goals and CMS Choice     Choice offered to / list presented to : NA      Expected Discharge Plan and Services     Post Acute Care Choice: Resumption of Svcs/PTA Provider Living arrangements for the past 2 months: Single Family Home                           HH Arranged: RN, PT, OT, Nurse's Aide HH Agency: [facility] Date [facility] Agency Contacted: 07/23/23   Representative spoke with at [facility] Agency: Cody Alexander  Prior Living Arrangements/Services Living arrangements for the past 2  months: Single Family Home Lives with:: Roommate Patient language and need for interpreter reviewed:: Yes Do you feel safe going back to the place where you live?: Yes      Need for Family Participation in Patient Care: Yes (Comment) Care giver support system in place?: Yes (comment) Current home services: DME, Home OT, Home PT, Homehealth aide, Home RN Criminal Activity/Legal Involvement Pertinent to Current Situation/Hospitalization: No - Comment as needed  Activities of Daily Living   ADL Screening (condition at time of admission) Independently performs ADLs?: Yes (appropriate for developmental age) Is the patient deaf or have difficulty hearing?: No Does the patient have difficulty seeing, even when wearing glasses/contacts?: Yes Does the patient have difficulty concentrating, remembering, or making decisions?: Yes  Permission Sought/Granted Permission sought to share information with : Facility Industrial/product designer granted to share information with : Yes, Verbal Permission Granted     Permission granted to share info w AGENCY: [facility], DSS        Emotional Assessment Appearance:: Appears stated age Attitude/Demeanor/Rapport: Engaged, Gracious Affect (typically observed): Accepting, Appropriate, Calm, Pleasant Orientation: : Oriented to Self, Oriented to Place, Oriented to  Time, Oriented to Situation Alcohol / Substance Use: Not Applicable Psych Involvement: No (comment)  Admission diagnosis:  Shortness of breath [R06.02] CHF exacerbation (HCC) [I50.9] Patient Active Problem List   Diagnosis Date Noted   Hypertensive emergency  07/23/2023   CHF exacerbation (HCC) 07/23/2023   Respiratory failure (HCC) 06/12/2023   Goals of care, counseling/discussion 01/16/2023   History of kidney stones 01/06/2023   Hypokalemia 01/05/2023   Bilateral leg pain 11/10/2022   Chest pain    Syncope and collapse    Chronic back pain    Acute on chronic  respiratory failure with hypoxia and hypercapnia (HCC) 03/14/2022   Diabetes mellitus without complication (HCC)    Acute exacerbation of chronic low back pain    Urinary tract infection    HLD (hyperlipidemia) 01/27/2022   CAD (coronary artery disease) 01/27/2022   Fall at home, initial encounter 01/27/2022   Leg weakness, bilateral 01/27/2022   Hyperkalemia    Acute CHF (congestive heart failure) (HCC) 08/15/2021   Benign prostatic hyperplasia with urinary hesitancy    Lower abdominal pain    Elevated troponin    Rash    Uncontrolled type 2 diabetes mellitus with hyperglycemia (HCC) 01/04/2021   Acute on chronic diastolic CHF (congestive heart failure) (HCC) 01/04/2021   CHF (congestive heart failure), NYHA class I, acute, diastolic (HCC) 01/04/2021   OSA (obstructive sleep apnea) 01/04/2021   Obesity (BMI 30-39.9) 01/04/2021   Chronic respiratory failure with hypoxia (HCC) 01/04/2021   Acute on chronic congestive heart failure (HCC)    Acute on chronic respiratory failure with hypoxia (HCC) 06/20/2020   NSTEMI (non-ST elevated myocardial infarction) (HCC) 02/25/2018   Obesity, Class III, BMI 40-49.9 (morbid obesity) (HCC) 01/19/2017   COPD with acute exacerbation (HCC) 05/13/2014   Uncontrolled type 2 diabetes mellitus with hyperglycemia, with long-term current use of insulin (HCC) 05/13/2014   Essential hypertension 05/13/2014   PCP:  [facility]acy:   [id number] [place], [place] - [address] [place] [address] Phone: [phone] Fax: [phone]  [id number] [place] - [address] [place] [address] Phone: [phone] Fax: [phone]  [facility] [address] [place] [address] Phone: [phone] Fax: [phone]     Social Determinants of Health (SDOH) Social History: SDOH Screenings   Food Insecurity: No  Food Insecurity (07/23/2023)  Housing: Medium Risk (07/23/2023)  Transportation Needs: Unmet Transportation Needs (07/23/2023)  Utilities: Not At Risk (07/23/2023)  Alcohol Screen: Low Risk  (07/23/2023)  Financial Resource Strain: Low Risk  (07/23/2023)  Physical Activity: Inactive (07/02/2021)   Received from [facility], [facility]  Social Connections: Moderately Isolated (10/21/2022)   Received from [facility], [facility]  Stress: Stress Concern Present (10/21/2022)   Received from [facility], [facility]  Tobacco Use: Medium Risk (07/23/2023)  Health Literacy: Medium Risk (10/21/2022)   [facility] from [facility], [facility]   SDOH Interventions: Food Insecurity Interventions: Inpatient TOC Housing Interventions: Inpatient [facility] Transportation Interventions: Inpatient TOC Alcohol Usage Interventions: Intervention Not Indicated (Score <7) Financial Strain Interventions: Intervention Not Indicated   Readmission Risk Interventions    07/23/2023    1:58 PM 06/25/2022   10:13 AM  Readmission Risk Prevention Plan  Transportation Screening Complete Complete  PCP or Specialist Appt within 3-5 Days Complete Complete  HRI or Home Care Consult Complete Complete  Social Work Consult for Recovery Care Planning/Counseling Complete Complete  Palliative Care Screening Not Applicable Not Applicable  Medication Review Oceanographer) Complete Complete

## 2023-07-23 NOTE — Assessment & Plan Note (Signed)
Patient with a nonproductive but congested cough Scheduled and as needed DuoNebs, IV steroids Rocephin, guaifenesin Flutter valve. Supplemental oxygen

## 2023-07-23 NOTE — Assessment & Plan Note (Signed)
Currently on BiPAP

## 2023-07-23 NOTE — Assessment & Plan Note (Addendum)
Hypertensive emergency BP 161/110 on arrival Chest x-ray showing pulmonary vascular congestion IV Lasix Continue spironolactone, metoprolol and ACE/ARB Daily weights with intake and output monitoring Last echo from 12/2022 showing EF 65 to 70% with G2 DD Will get repeat echo

## 2023-07-23 NOTE — Discharge Instructions (Addendum)
Food Resources  Agency Name: [facility] Address: [address] [place], [place] [address] Phone: [phone] Website: [email or web address] Service(s) Offered: Housing services, self-sufficiency, congregate meal program, weatherization program, Event organiser program, emergency food assistance,  housing counseling, home ownership program, wheels - to work program.  [employer] free for 60 and older at various locations from [employer], Monday-Friday:  [employer], [address] [place], [phone] -[facility], [address], [place] [phone]  -[facility], [address], [place] [phone]  -[address], [place], [phone]  Agency Name: [facility] Address: [phone] [address], [place], [place] [address] Phone: [phone] Website: [email or web address] Service(s) Offered: Home delivered hot, frozen, and emergency  meals. Grocery assistance program which matches  volunteers one-on-one with seniors unable to grocery shop  for themselves. Must be 60 years and older; less than 20  hours of in-home aide service, limited or no driving ability;  live alone or with someone with a disability; live in  [place].  Agency Name: Ecologist [facility]) Address: [address], [place], [place] [address] Phone: [phone] Service(s) Offered: Food is served to shut-ins, homeless, elderly, and low income people in the community every Saturday (11:30 am-12:30 pm) and Sunday (12:30 pm-1:30pm). Volunteers also offer help and encouragement in seeking employment,  and spiritual guidance.  Agency Name: Department of Social Services Address: [address] [place], [place] [address] Phone: [phone] Service(s) Offered: Child support services; child welfare services; food stamps; Medicaid; work first family assistance; and aid  with fuel,  rent, food and medicine.  Agency Name: Dietitian Address: [address], [place], [place] Phone: [phone] Website: [email or web address] Services Offered: Monday 10:00am-12:00, 8:00pm-9:00pm, and Friday 10:00am-12:00.  Agency Name: [employer] [facility] Address: [address] [place], [place] [address] Phone: [phone] Website: [email or web address] Service(s) Offered: Serves weekday meals, open from 11:30 am- 1:00 pm., and 6:30-7:30pm, Monday-Wednesday-Friday distributes food 3:30-6pm, Monday-Wednesday-Friday.  Agency Name: [facility] Address: [address] [place], [place] Phone: [phone] Website: [email or web address] Services Offered: Distributes food the 4th Saturday of the month, starting at 8:00 am  Agency Name: [facility] Address: [phone] [address], [place], [place] [address] Phone: [phone] Website: [email or web address] Service(s) Offered: Bread of life, weekly food pantry. Open Wednesdays from 10:00am-noon.  Agency Name: [facility] [employer] Address: [address] [place], [place], [place] Phone: [phone] Services Offered: Distributes food 9am-1pm, Monday-Thursday. Call for details.  Agency Name: [facility] Address: [address], [place], [place] [address] Phone: [phone] Website: [email or web address] Service(s) Offered: Games developer. Call for assistance.  Agency Name: [facility] Address: [address] [place], [place] [address] Phone: [phone] Service Offered: Emergency Food Pantry. Call for appointment.  Agency Name: [facility] Address: [address], [place], [place] [address] Phone: [phone] Website: [email or web address] Services Offered: Games developer. Call for details  Agency Name: [facility] Address: [address] [place], [place] Phone:  [phone] Website: [email or web address] Service(s) Offered: Emergency Food Pantry. Call for details.  Agency Name: Holiday representative Address: [address], [place], [place] [address] Phone: [phone] or [phone] Website: [email or web address] Service(s) Offered: Distribute food 9am-11:30 am, Tuesday-Friday, and 1-3:30pm, Monday-Friday. Food pantry Monday-Friday 1pm-3pm, fresh items, Mon.-Wed.-Fri.  Agency Name: [facility] Address: [address] [place], [place] [address] Phone: [phone] Website: [email or web address] Services Offered: Distribute food Tues and Sats from 9:00am-noon.  Closed 1st Saturday of each month. Call for details  Agency Name: [facility] Address: Reyn[address]do [address] [facility] [address] [place] [address] Phone: [phone]  Services Offered: Delivers meals every Thursday   Rent/Utility/Housing  Agency Name: [facility] Agency Address: [address] [place], [place] [address] Phone: [phone] Email: [email or web address] Website: [email or web address] Service(s) Offered: Housing services, self-sufficiency, congregate meal program, weatherization program, Field seismologist program, emergency food assistance,  housing counseling, home ownership program, wheels -towork program.  Agency Name: [facility] Address: [address], [place], [place] [address] Phone: [phone] (8a-4p) [phone] (8p- 10p) Email: [email or web address] Website: [email or web address] Service(s) Offered: A program for homeless and/or needy men that includes one-on-one counseling, life skills training and job rehabilitation.  Agency Name: [employer] [facility] [place] Address: [address] [place], [place] [address] Phone: [phone] Website: www.[email or web address] Service(s) Offered: Assistance to needy in emergency with  utility bills, heating fuel, and prescriptions. Shelter for homeless 7pm-7am. December 19, 2016 15  Agency Name: [facility] [place] (Developmentally Disabled) Address: [address], [place], [place] [address] Phone: [phone] Contact Person: Cathleen Corti Email: [email or web address] Website: [email or web address] Service(s) Offered: Helps individuals with developmental disabilities move from housing that is more restrictive to homes where they  can achieve greater independence and have more  opportunities.  Agency Name: [facility] Address: [address], [place], [place] [address] Phone: [phone] Email: [email or web address] Website: [email or web address].org Service(s) Offered: Provides affordable housing for low-income families, elderly, and disabled individuals. Offer a wide range of  programs and services, from financial planning to afterschool and summer programs.  Agency Name: Department of Social Services Address: [address] [place], [place] [address] Phone: [phone] Service(s) Offered: Child support services; child welfare services; food stamps; Medicaid; work first family assistance; and aid with fuel,  rent, food and medicine.  Agency Name: [facility] [place] [employer]. Address: [facility] [address] [place], [place]  [address] Phone: [phone] Website: www.familyabuseservices.org Service(s) Offered: 24 hour Crisis Line: [phone]; 24 hour Emergency Shelter; Transitional Housing; Support Groups; Scientist, physiological; [employer]; Hispanic Outreach: [phone];  Visitation Center: [phone].  Agency Name: [facility] [place]. Address: [address] [place], [place] [address] Phone: [phone] Service(s) Offered: CAP Services; Home and [employer]; Individual or Group Supports; Respite Care Non-Institutional Nursing;  Residential Supports; Respite Care and Personal Care  Services; Transportation; Family and Friends Night; Recreational Activities; Three Nutritious Meals/Snacks; Consultation with Registered Dietician; Twenty-four hour Registered Nurse Access; Daily and [employer]; Camp Green Leaves; Lolita for the [employer] (During Summer Months) Bingo Night (Every  Wednesday Night); Special Populations Dance Night  (Every Tuesday Night); Professional Hair Care Services.  Agency Name: [facility] Address: [address], [place], [place] [address] Phone: [phone] Contact Person: Jabier Mutton Website: [email or web address].Physicist, medical) Offered: Residential treatment facility for women; food and  clothing, educational & employment development and  transportation to work; Counsellor of financial skills;  parenting and family reunification; emotional and spiritual  support; transitional housing for program graduates.  Agency Name: [facility] Address: [address] [place], [place] [address] Phone: [phone] Email: [email or web address] Website: [email or web address].es Service(s) Offered: Public housing units for elderly, disabled, and low income people; housing choice vouchers for income eligible  applicants; shelter plus care vouchers; and Psychologist, clinical.  Agency Name: [facility] [employer] Address: [address], [place], [place] [address] Phone: [phone] Email: [email or web address] Website: www.habitatalamance.org  Service(s) Offered: Build houses for families in need of decent housing. Each adult in the family must invest 200 hours of labor on  someone else's house, work with volunteers to build their own house, attend classes on budgeting, home maintenance, yard care, and attend homeowner association meetings.  Agency Name: [facility] Address: [address] [place], [place] [address] Phone: [phone] Website:  www.rsli.org Service(s) Offered: Intermediate care facilities for intellectually delayed, Supervised Living in group homes for adults with developmental disabilities, Supervised Living for people who have dual diagnoses (MRMI), Independent Living, Supported Living, respite and a variety of CAP services, pre-vocational services, day supports, and [employer].  Agency Name: [place] [facility] Phone: [phone] Website: www.NCForeclosurePrevention.gov Service(s) Offered: Zero-interest, deferred loans to homeowners struggling to pay their mortgage. Call for more information.   Transportation Resources  Agency Name: [facility] Agency Address: [address] [place], [place] [address] Phone: [phone] Email: [email or web address] Website: www.[email or web address].org Service(s) Offered: Housing services, self-sufficiency, congregate meal program, weatherization program, Field seismologist program, emergency food assistance,  housing counseling, home ownership program, wheels-towork program.  Agency Name: [facility] [employer] [phone]) Address: [address], [place], [place] [address] Phone: [phone] Website: [email or web address].[email or web address] Service(s) Offered: Transportation for [employer], subscription and demand response; Dial-a-Ride for citizens 54 years of age or older.  Agency Name: Department of Social Services Address: [address] [place], [place] [address] Phone: [phone] Service(s) Offered: Child support services; child welfare services; food stamps; Medicaid; work first family assistance; and aid with fuel,  rent, food and medicine, transportation assistance.  Agency Name: Disabled [employer] (DAV) Transportation  Network Phone: [phone] Service(s) Offered: Transports veterans to the [facility] medical center. Call  forty-eight hours in advance and leave the name,  telephone  number, date, and time of appointment. Veteran will be  contacted by the driver the day before the appointment to  arrange a pick up point   Transportation Resources  Agency Name: [facility] Agency Address: [address] [place], [place] [address] Phone: [phone] Email: [email or web address] Website: [email or web address] Service(s) Offered: Housing services, self-sufficiency, congregate meal program, weatherization program, Field seismologist program, emergency food assistance,  housing counseling, home ownership program, wheels-towork program.  Agency Name: [facility] [employer] [phone]) Address: [address], [place], [place] [address] Phone: [phone] Website: [email or web address].[email or web address] Service(s) Offered: Transportation for [employer], subscription and demand response; Dial-a-Ride for citizens 3 years of age or older.  Agency Name: Department of Social Services Address: [address] [place], [place] [address] Phone: [phone] Service(s) Offered: Child support services; child welfare services; food stamps; Medicaid; work first family assistance; and aid with fuel,  rent, food and medicine, transportation assistance.  Agency Name: Disabled [employer] (DAV) Transportation  Network Phone: [phone] Service(s) Offered: Transports veterans to the [facility] medical center. Call  forty-eight hours in advance and leave the name, telephone  number, date, and time of appointment. Veteran will be  contacted by the driver the day before the appointment to  arrange a pick up point    [employer] ACTA currently provides door to door services. ACTA connects with PART daily for services to [facility] [place]. ACTA also performs contract services to [employer] operates 27 vehicles, all but 3 mini-vans are equipped with lifts for special needs as well as the  general public. ACTA drivers are each CDL certified and trained in First Aid and CPR. ACTA was established in 2002 by [employer]  county Building services engineer. An independent Industrial/product designer. ACTA operates via Cytogeneticist with required Research scientist (physical sciences) from [place]. ACTA provides over 80,000 passenger trips each year, including [facility] and [employer] sites.  Call at least by 11 AM one business day prior to needing transportation  [employer].                      [place], [place] [address]     Office Hours: Monday-Friday  8 AM - 5 PM

## 2023-07-23 NOTE — Progress Notes (Signed)
Date and time results received: 07/23/23 0745   Test: lactic acid  Critical Value: 2.2  Name of Provider Notified: Dr. Georgeann Oppenheim

## 2023-07-23 NOTE — Assessment & Plan Note (Signed)
Blood sugar over 217 Continue home basal insulin Sliding scale insulin coverage

## 2023-07-23 NOTE — Plan of Care (Signed)
  Problem: Education: Goal: Ability to describe self-care measures that may prevent or decrease complications (Diabetes Survival Skills Education) will improve Outcome: Progressing Goal: Individualized Educational Video(s) Outcome: Progressing   Problem: Coping: Goal: Ability to adjust to condition or change in health will improve Outcome: Progressing   Problem: Fluid Volume: Goal: Ability to maintain a balanced intake and output will improve Outcome: Progressing   Problem: Health Behavior/Discharge Planning: Goal: Ability to identify and utilize available resources and services will improve Outcome: Progressing Goal: Ability to manage health-related needs will improve Outcome: Progressing   Problem: Metabolic: Goal: Ability to maintain appropriate glucose levels will improve Outcome: Progressing   Problem: Nutritional: Goal: Maintenance of adequate nutrition will improve Outcome: Progressing Goal: Progress toward achieving an optimal weight will improve Outcome: Progressing   Problem: Skin Integrity: Goal: Risk for impaired skin integrity will decrease Outcome: Progressing   Problem: Tissue Perfusion: Goal: Adequacy of tissue perfusion will improve Outcome: Progressing   Problem: Education: Goal: Knowledge of General Education information will improve Description: Including pain rating scale, medication(s)/side effects and non-pharmacologic comfort measures Outcome: Progressing   Problem: Health Behavior/Discharge Planning: Goal: Ability to manage health-related needs will improve Outcome: Progressing   Problem: Clinical Measurements: Goal: Ability to maintain clinical measurements within normal limits will improve Outcome: Progressing Goal: Will remain free from infection Outcome: Progressing Goal: Diagnostic test results will improve Outcome: Progressing Goal: Respiratory complications will improve Outcome: Progressing Goal: Cardiovascular complication will  be avoided Outcome: Progressing   Problem: Activity: Goal: Risk for activity intolerance will decrease Outcome: Progressing   Problem: Nutrition: Goal: Adequate nutrition will be maintained Outcome: Progressing   Problem: Coping: Goal: Level of anxiety will decrease Outcome: Progressing   Problem: Elimination: Goal: Will not experience complications related to bowel motility Outcome: Progressing Goal: Will not experience complications related to urinary retention Outcome: Progressing   Problem: Pain Management: Goal: General experience of comfort will improve Outcome: Progressing   Problem: Safety: Goal: Ability to remain free from injury will improve Outcome: Progressing   Problem: Skin Integrity: Goal: Risk for impaired skin integrity will decrease Outcome: Progressing   Problem: Education: Goal: Ability to demonstrate management of disease process will improve Outcome: Progressing Goal: Ability to verbalize understanding of medication therapies will improve Outcome: Progressing Goal: Individualized Educational Video(s) Outcome: Progressing   Problem: Activity: Goal: Capacity to carry out activities will improve Outcome: Progressing   Problem: Cardiac: Goal: Ability to achieve and maintain adequate cardiopulmonary perfusion will improve Outcome: Progressing   Problem: Education: Goal: Knowledge of disease or condition will improve Outcome: Progressing Goal: Knowledge of the prescribed therapeutic regimen will improve Outcome: Progressing Goal: Individualized Educational Video(s) Outcome: Progressing   Problem: Activity: Goal: Ability to tolerate increased activity will improve Outcome: Progressing Goal: Will verbalize the importance of balancing activity with adequate rest periods Outcome: Progressing   Problem: Respiratory: Goal: Ability to maintain a clear airway will improve Outcome: Progressing Goal: Levels of oxygenation will improve Outcome:  Progressing Goal: Ability to maintain adequate ventilation will improve Outcome: Progressing

## 2023-07-23 NOTE — Assessment & Plan Note (Signed)
Complicating factor to overall prognosis and care 

## 2023-07-23 NOTE — Assessment & Plan Note (Signed)
CAD with history of stent Troponin elevated to 88, likely demand ischemia Continue home Isordil, metoprolol, lisinopril and atorvastatin

## 2023-07-24 DIAGNOSIS — I5033 Acute on chronic diastolic (congestive) heart failure: Secondary | ICD-10-CM

## 2023-07-24 LAB — GLUCOSE, CAPILLARY
Glucose-Capillary: 305 mg/dL — ABNORMAL HIGH (ref 70–99)
Glucose-Capillary: 193 mg/dL — ABNORMAL HIGH (ref 70–99)
Glucose-Capillary: 138 mg/dL — ABNORMAL HIGH (ref 70–99)
Glucose-Capillary: 170 mg/dL — ABNORMAL HIGH (ref 70–99)

## 2023-07-24 MED ORDER — AMOXICILLIN-POT CLAVULANATE 875-125 MG PO TABS
1.0000 | ORAL_TABLET | Freq: Two times a day (BID) | ORAL | Status: AC
  Administered 2023-07-25 – 2023-07-27 (×6): 1 via ORAL
  Filled 2023-07-24 (×6): qty 1

## 2023-07-24 MED ORDER — METHYLPREDNISOLONE SODIUM SUCC 40 MG IJ SOLR
40.0000 mg | Freq: Two times a day (BID) | INTRAMUSCULAR | Status: DC
  Administered 2023-07-24 – 2023-07-27 (×7): 40 mg via INTRAVENOUS
  Filled 2023-07-24 (×7): qty 1

## 2023-07-24 MED ORDER — GUAIFENESIN ER 600 MG PO TB12
1200.0000 mg | ORAL_TABLET | Freq: Two times a day (BID) | ORAL | Status: DC
  Administered 2023-07-24 – 2023-08-03 (×21): 1200 mg via ORAL
  Filled 2023-07-24 (×20): qty 2

## 2023-07-24 MED ORDER — HYDROCOD POLI-CHLORPHE POLI ER 10-8 MG/5ML PO SUER
5.0000 mL | Freq: Three times a day (TID) | ORAL | Status: DC
  Administered 2023-07-24 – 2023-07-25 (×4): 5 mL via ORAL
  Filled 2023-07-24 (×3): qty 5

## 2023-07-24 MED ORDER — ATORVASTATIN CALCIUM 80 MG PO TABS
80.0000 mg | ORAL_TABLET | Freq: Every day | ORAL | Status: DC
  Administered 2023-07-25 – 2023-08-03 (×10): 80 mg via ORAL
  Filled 2023-07-24 (×11): qty 1

## 2023-07-24 MED ORDER — SPIRONOLACTONE 25 MG PO TABS
25.0000 mg | ORAL_TABLET | Freq: Every day | ORAL | Status: DC
  Administered 2023-07-24 – 2023-08-03 (×11): 25 mg via ORAL
  Filled 2023-07-24 (×11): qty 1

## 2023-07-24 MED ORDER — TORSEMIDE 20 MG PO TABS
40.0000 mg | ORAL_TABLET | Freq: Every day | ORAL | Status: DC
  Administered 2023-07-24 – 2023-08-03 (×11): 40 mg via ORAL
  Filled 2023-07-24 (×11): qty 2

## 2023-07-24 MED ORDER — HYDROXYZINE HCL 25 MG PO TABS
25.0000 mg | ORAL_TABLET | Freq: Four times a day (QID) | ORAL | Status: DC | PRN
  Administered 2023-07-26 – 2023-08-02 (×5): 25 mg via ORAL
  Filled 2023-07-24 (×5): qty 1

## 2023-07-24 MED ORDER — LISINOPRIL 20 MG PO TABS
20.0000 mg | ORAL_TABLET | Freq: Every day | ORAL | Status: DC
  Administered 2023-07-24 – 2023-07-30 (×7): 20 mg via ORAL
  Filled 2023-07-24 (×7): qty 1

## 2023-07-24 MED ORDER — PANTOPRAZOLE SODIUM 40 MG PO TBEC
40.0000 mg | DELAYED_RELEASE_TABLET | Freq: Every day | ORAL | Status: DC
  Administered 2023-07-24 – 2023-08-03 (×11): 40 mg via ORAL
  Filled 2023-07-24 (×11): qty 1

## 2023-07-24 NOTE — Progress Notes (Signed)
PROGRESS NOTE    Cody Alexander  NGE:[id number] DOB: Jan 06, 1956 DOA: 07/23/2023 PCP: [facility]    Brief Narrative:  67 year old male with known history of diastolic congestive heart failure and COPD who presents with progressive shortness of breath and mild bilateral lower extremity weakness.  Shortness of breath is slowly improving.  I suspect this is secondary to more decompensated COPD than CHF.  Mild pulmonary edema noted on chest imaging.  Trace lower extremity edema, BNP minimally elevated.   Will continue to treat for concomitant CHF and COPD.  Continue diuresis to maintain net euvolemia or mild net negative status.  IV steroids, bronchodilators.  Wean oxygen as tolerated.  Nocturnal CPAP.   Assessment & Plan:   Principal Problem:   CHF exacerbation (HCC) Active Problems:   Acute on chronic respiratory failure with hypoxia and hypercapnia (HCC)   Acute on chronic diastolic CHF (congestive heart failure) (HCC)   Hypertensive emergency   COPD with acute exacerbation (HCC)   Elevated troponin   CAD (coronary artery disease)   Uncontrolled type 2 diabetes mellitus with hyperglycemia, with long-term current use of insulin (HCC)   Obesity, Class III, BMI 40-49.9 (morbid obesity) (HCC)   OSA (obstructive sleep apnea)  Acute on chronic respiratory failure with hypoxia and hypercapnia (HCC) Likely secondary to both COPD and CHF exacerbation Weaned successfully off BiPAP Treatment pathways for COPD and CHF   Acute on chronic diastolic CHF (congestive heart failure) (HCC) Hypertensive emergency BP 161/110 on arrival Chest x-ray showing pulmonary vascular congestion Plan: Discontinue IV Lasix.  Patient markedly net negative.  Can resume spironolactone, metoprolol, ACE inhibitor.  Continue with daily weights and strict ins and outs.  COPD with acute exacerbation (HCC) The primary driver for patient's symptoms.  RVP and  COVID-negative. Plan: Steroids Bronchodilators Mucolytic's Wean oxygen as tolerated Antitussives     Elevated troponin CAD with history of stent Troponin elevated to 88, likely demand ischemia Continue home Isordil, metoprolol, lisinopril and atorvastatin   Uncontrolled type 2 diabetes mellitus with hyperglycemia, with long-term current use of insulin (HCC) Basal bolus regimen Carb modified diet   OSA (obstructive sleep apnea) Currently on BiPAP   Obesity, Class III, BMI 40-49.9 (morbid obesity) (HCC) Complicating factor to overall prognosis and care     DVT prophylaxis: SQ Lovenox Code Status: Full Family Communication: None Disposition Plan: Status is: Inpatient Remains inpatient appropriate because: Concomitant CHF and COPD exacerbation   Level of care: Progressive  Consultants:  None  Procedures:  None  Antimicrobials: None   Subjective: Seen and examined.  Reports mild improvement in symptoms over interval.  Objective: Vitals:   07/23/23 2356 07/24/23 0500 07/24/23 0744 07/24/23 0746  BP: 130/74 (!) 143/84  (!) 146/96  Pulse:    87  Resp:    20  Temp: 97.8 F (36.6 C) 97.8 F (36.6 C)  98 F (36.7 C)  TempSrc: Oral Oral  Oral  SpO2: 90%  91% 93%  Weight:      Height:        Intake/Output Summary (Last 24 hours) at 07/24/2023 1154 Last data filed at 07/24/2023 0900 Gross per 24 hour  Intake --  Output 3325 ml  Net -3325 ml   Filed Weights   07/23/23 0514 07/23/23 0600  Weight: (!) 140.1 kg (!) 136.9 kg    Examination:  General exam: Appears calm and comfortable  Respiratory system: Crackles.  End expiratory wheeze.  Normal work of breathing.  3 L Cardiovascular system: S1-S2, RRR,  no murmurs, no pedal edema Gastrointestinal system:, Soft, NT/ND, normal bowel sounds Central nervous system: Alert and oriented. No focal neurological deficits. Extremities: Symmetric 5 x 5 power. Skin: No rashes, lesions or ulcers Psychiatry:  Judgement and insight appear normal. Mood & affect appropriate.     Data Reviewed: I have personally reviewed following labs and imaging studies  CBC: Recent Labs  Lab 07/23/23 0025  WBC 9.4  NEUTROABS 6.6  HGB 12.0*  HCT 37.6*  MCV 90.0  PLT 257   Basic Metabolic Panel: Recent Labs  Lab 07/23/23 0025  NA 135  K 4.3  CL 95*  CO2 32  GLUCOSE 271*  BUN 21  CREATININE 1.15  CALCIUM 8.8*   GFR: Estimated Creatinine Clearance: 90.5 mL/min (by C-G formula based on SCr of 1.15 mg/dL). Liver Function Tests: Recent Labs  Lab 07/23/23 0025  AST 21  ALT 21  ALKPHOS 55  BILITOT 0.7  PROT 6.5  ALBUMIN 3.4*   No results for input(s): "LIPASE", "AMYLASE" in the last 168 hours. No results for input(s): "AMMONIA" in the last 168 hours. Coagulation Profile: No results for input(s): "INR", "PROTIME" in the last 168 hours. Cardiac Enzymes: No results for input(s): "CKTOTAL", "CKMB", "CKMBINDEX", "TROPONINI" in the last 168 hours. BNP (last 3 results) No results for input(s): "PROBNP" in the last 8760 hours. HbA1C: Recent Labs    07/23/23 0625  HGBA1C 10.5*   CBG: Recent Labs  Lab 07/23/23 1212 07/23/23 1357 07/23/23 1555 07/23/23 2053 07/24/23 0747  GLUCAP 412* 299* 144* 196* 305*   Lipid Profile: No results for input(s): "CHOL", "HDL", "LDLCALC", "TRIG", "CHOLHDL", "LDLDIRECT" in the last 72 hours. Thyroid Function Tests: No results for input(s): "TSH", "T4TOTAL", "FREET4", "T3FREE", "THYROIDAB" in the last 72 hours. Anemia Panel: No results for input(s): "VITAMINB12", "FOLATE", "FERRITIN", "TIBC", "IRON", "RETICCTPCT" in the last 72 hours. Sepsis Labs: Recent Labs  Lab 07/23/23 0025 07/23/23 0625 07/23/23 0819  LATICACIDVEN 1.2 2.2* 1.2    Recent Results (from the past 240 hour(s))  Resp panel by RT-PCR (RSV, Flu A&B, Covid) Anterior Nasal Swab     Status: None   Collection Time: 07/23/23 12:25 AM   Specimen: Anterior Nasal Swab  Result Value Ref  Range Status   SARS Coronavirus 2 by RT PCR NEGATIVE NEGATIVE Final    Comment: (NOTE) SARS-CoV-2 target nucleic acids are NOT DETECTED.  The SARS-CoV-2 RNA is generally detectable in upper respiratory specimens during the acute phase of infection. The lowest concentration of SARS-CoV-2 viral copies this assay can detect is 138 copies/mL. A negative result does not preclude SARS-Cov-2 infection and should not be used as the sole basis for treatment or other patient management decisions. A negative result may occur with  improper specimen collection/handling, submission of specimen other than nasopharyngeal swab, presence of viral mutation(s) within the areas targeted by this assay, and inadequate number of viral copies(<138 copies/mL). A negative result must be combined with clinical observations, patient history, and epidemiological information. The expected result is Negative.  Fact Sheet for Patients:  BloggerCourse.com  Fact Sheet for Healthcare Providers:  SeriousBroker.it  This test is no t yet approved or cleared by the [place] FDA and  has been authorized for detection and/or diagnosis of SARS-CoV-2 by FDA under an Emergency Use Authorization (EUA). This EUA will remain  in effect (meaning this test can be used) for the duration of the COVID-19 declaration under Section 564(b)(1) of the Act, 21 U.S.C.section 360bbb-3(b)(1), unless the authorization is terminated  or  revoked sooner.       Influenza A by PCR NEGATIVE NEGATIVE Final   Influenza B by PCR NEGATIVE NEGATIVE Final    Comment: (NOTE) The Xpert Xpress SARS-CoV-2/FLU/RSV plus assay is intended as an aid in the diagnosis of influenza from Nasopharyngeal swab specimens and should not be used as a sole basis for treatment. Nasal washings and aspirates are unacceptable for Xpert Xpress SARS-CoV-2/FLU/RSV testing.  Fact Sheet for  Patients: BloggerCourse.com  Fact Sheet for Healthcare Providers: SeriousBroker.it  This test is not yet approved or cleared by the [place] FDA and has been authorized for detection and/or diagnosis of SARS-CoV-2 by FDA under an Emergency Use Authorization (EUA). This EUA will remain in effect (meaning this test can be used) for the duration of the COVID-19 declaration under Section 564(b)(1) of the Act, 21 U.S.C. section 360bbb-3(b)(1), unless the authorization is terminated or revoked.     Resp Syncytial Virus by PCR NEGATIVE NEGATIVE Final    Comment: (NOTE) Fact Sheet for Patients: BloggerCourse.com  Fact Sheet for Healthcare Providers: SeriousBroker.it  This test is not yet approved or cleared by the [place] FDA and has been authorized for detection and/or diagnosis of SARS-CoV-2 by FDA under an Emergency Use Authorization (EUA). This EUA will remain in effect (meaning this test can be used) for the duration of the COVID-19 declaration under Section 564(b)(1) of the Act, 21 U.S.C. section 360bbb-3(b)(1), unless the authorization is terminated or revoked.  Performed at [facility], [address] [place], [place] [address]   Blood culture (routine x 2)     Status: None (Preliminary result)   Collection Time: 07/23/23 12:25 AM   Specimen: BLOOD  Result Value Ref Range Status   Specimen Description BLOOD BLOOD LEFT ARM  Final   Special Requests   Final    BOTTLES DRAWN AEROBIC AND ANAEROBIC Blood Culture results may not be optimal due to an excessive volume of blood received in culture bottles   Culture   Final    NO GROWTH 1 DAY Performed at [facility], [address], [place], [place] [address]    Report Status PENDING  Incomplete  Culture, blood (Routine X 2) w Reflex to ID Panel     Status: None (Preliminary result)   Collection  Time: 07/23/23  8:19 AM   Specimen: BLOOD RIGHT ARM  Result Value Ref Range Status   Specimen Description BLOOD RIGHT ARM  Final   Special Requests   Final    BOTTLES DRAWN AEROBIC AND ANAEROBIC Blood Culture adequate volume   Culture   Final    NO GROWTH < 24 HOURS Performed at [facility], [address] [place], [place] [address]    Report Status PENDING  Incomplete  Respiratory (~20 pathogens) panel by PCR     Status: None   Collection Time: 07/23/23  9:30 AM   Specimen: Nasopharyngeal Swab; Respiratory  Result Value Ref Range Status   Adenovirus NOT DETECTED NOT DETECTED Final   Coronavirus 229E NOT DETECTED NOT DETECTED Final    Comment: (NOTE) The Coronavirus on the Respiratory Panel, DOES NOT test for the novel  Coronavirus (2019 nCoV)    Coronavirus HKU1 NOT DETECTED NOT DETECTED Final   Coronavirus NL63 NOT DETECTED NOT DETECTED Final   Coronavirus OC43 NOT DETECTED NOT DETECTED Final   Metapneumovirus NOT DETECTED NOT DETECTED Final   Rhinovirus / Enterovirus NOT DETECTED NOT DETECTED Final   Influenza A NOT DETECTED NOT DETECTED Final  Influenza B NOT DETECTED NOT DETECTED Final   Parainfluenza Virus 1 NOT DETECTED NOT DETECTED Final   Parainfluenza Virus 2 NOT DETECTED NOT DETECTED Final   Parainfluenza Virus 3 NOT DETECTED NOT DETECTED Final   Parainfluenza Virus 4 NOT DETECTED NOT DETECTED Final   Respiratory Syncytial Virus NOT DETECTED NOT DETECTED Final   Bordetella pertussis NOT DETECTED NOT DETECTED Final   Bordetella Parapertussis NOT DETECTED NOT DETECTED Final   Chlamydophila pneumoniae NOT DETECTED NOT DETECTED Final   Mycoplasma pneumoniae NOT DETECTED NOT DETECTED Final    Comment: Performed at [facility], [address], [place], [place] [address]         Radiology Studies: DG Chest Port 1 View  Result Date: 07/23/2023 CLINICAL DATA:  Possible sepsis respiratory distress EXAM: PORTABLE CHEST 1 VIEW COMPARISON:  06/17/2023  FINDINGS: Cardiomegaly with mild central congestion. No acute airspace disease or effusion. No pneumothorax. Mild atherosclerosis IMPRESSION: Cardiomegaly with mild central congestion. Electronically Signed   By: Jasmine Pang M.D.   On: 07/23/2023 01:21        Scheduled Meds:  [START ON 07/25/2023] amoxicillin-clavulanate  1 tablet Oral Q12H   arformoterol  15 mcg Nebulization BID   aspirin EC  81 mg Oral Daily   atorvastatin  40 mg Oral Daily   chlorpheniramine-HYDROcodone  5 mL Oral Q8H   dapagliflozin propanediol  10 mg Oral Daily   enoxaparin (LOVENOX) injection  70 mg Subcutaneous Q24H   furosemide  40 mg Intravenous Q12H   guaiFENesin  1,200 mg Oral BID   insulin aspart  0-20 Units Subcutaneous TID WC   insulin aspart  0-5 Units Subcutaneous QHS   insulin aspart  8 Units Subcutaneous TID WC   insulin glargine-yfgn  20 Units Subcutaneous Daily   ipratropium-albuterol  3 mL Nebulization BID   losartan  50 mg Oral Daily   magnesium oxide  400 mg Oral BID   methylPREDNISolone (SOLU-MEDROL) injection  40 mg Intravenous Q12H   metoprolol succinate  25 mg Oral Daily   Continuous Infusions:   LOS: 1 day     Tresa Moore, MD [facility]   If 7PM-7AM, please contact night-coverage  07/24/2023, 11:54 AM

## 2023-07-25 DIAGNOSIS — I5033 Acute on chronic diastolic (congestive) heart failure: Secondary | ICD-10-CM | POA: Diagnosis not present

## 2023-07-25 LAB — CBC WITH DIFFERENTIAL/PLATELET
Abs Immature Granulocytes: 0.06 10*3/uL (ref 0.00–0.07)
Basophils Absolute: 0 10*3/uL (ref 0.0–0.1)
Basophils Relative: 0 %
Eosinophils Absolute: 0 10*3/uL (ref 0.0–0.5)
Eosinophils Relative: 0 %
HCT: 43.8 % (ref 39.0–52.0)
Hemoglobin: 14.1 g/dL (ref 13.0–17.0)
Immature Granulocytes: 1 %
Lymphocytes Relative: 8 %
Lymphs Abs: 0.9 10*3/uL (ref 0.7–4.0)
MCH: 28.9 pg (ref 26.0–34.0)
MCHC: 32.2 g/dL (ref 30.0–36.0)
MCV: 89.8 fL (ref 80.0–100.0)
Monocytes Absolute: 0.6 10*3/uL (ref 0.1–1.0)
Monocytes Relative: 5 %
Neutro Abs: 9.8 10*3/uL — ABNORMAL HIGH (ref 1.7–7.7)
Neutrophils Relative %: 86 %
Platelets: 348 10*3/uL (ref 150–400)
RBC: 4.88 MIL/uL (ref 4.22–5.81)
RDW: 13.9 % (ref 11.5–15.5)
WBC: 11.4 10*3/uL — ABNORMAL HIGH (ref 4.0–10.5)
nRBC: 0 % (ref 0.0–0.2)

## 2023-07-25 LAB — BASIC METABOLIC PANEL
Anion gap: 12 (ref 5–15)
BUN: 37 mg/dL — ABNORMAL HIGH (ref 8–23)
CO2: 35 mmol/L — ABNORMAL HIGH (ref 22–32)
Calcium: 8.8 mg/dL — ABNORMAL LOW (ref 8.9–10.3)
Chloride: 89 mmol/L — ABNORMAL LOW (ref 98–111)
Creatinine, Ser: 1.31 mg/dL — ABNORMAL HIGH (ref 0.61–1.24)
GFR, Estimated: >60 mL/min (ref >60)
Glucose, Bld: 266 mg/dL — ABNORMAL HIGH (ref 70–99)
Potassium: 4 mmol/L (ref 3.5–5.1)
Sodium: 136 mmol/L (ref 135–145)

## 2023-07-25 LAB — GLUCOSE, CAPILLARY
Glucose-Capillary: 280 mg/dL — ABNORMAL HIGH (ref 70–99)
Glucose-Capillary: 191 mg/dL — ABNORMAL HIGH (ref 70–99)
Glucose-Capillary: 152 mg/dL — ABNORMAL HIGH (ref 70–99)
Glucose-Capillary: 185 mg/dL — ABNORMAL HIGH (ref 70–99)

## 2023-07-25 MED ORDER — INSULIN ASPART 100 UNIT/ML IJ SOLN
10.0000 [IU] | Freq: Three times a day (TID) | INTRAMUSCULAR | Status: DC
  Administered 2023-07-25 – 2023-08-03 (×26): 10 [IU] via SUBCUTANEOUS
  Filled 2023-07-25 (×23): qty 1

## 2023-07-25 MED ORDER — IPRATROPIUM-ALBUTEROL 0.5-2.5 (3) MG/3ML IN SOLN
3.0000 mL | Freq: Four times a day (QID) | RESPIRATORY_TRACT | Status: DC
  Administered 2023-07-25 – 2023-07-26 (×4): 3 mL via RESPIRATORY_TRACT
  Filled 2023-07-25 (×4): qty 3

## 2023-07-25 MED ORDER — HYDROCOD POLI-CHLORPHE POLI ER 10-8 MG/5ML PO SUER
5.0000 mL | Freq: Four times a day (QID) | ORAL | Status: DC
  Administered 2023-07-25 – 2023-07-27 (×8): 5 mL via ORAL
  Filled 2023-07-25 (×8): qty 5

## 2023-07-25 MED ORDER — INSULIN GLARGINE-YFGN 100 UNIT/ML ~~LOC~~ SOLN
28.0000 [IU] | Freq: Every day | SUBCUTANEOUS | Status: DC
  Administered 2023-07-26 – 2023-07-31 (×6): 28 [IU] via SUBCUTANEOUS
  Filled 2023-07-25 (×7): qty 0.28

## 2023-07-25 NOTE — Plan of Care (Signed)
  Problem: Health Behavior/Discharge Planning: Goal: Ability to identify and utilize available resources and services will improve Outcome: Progressing Goal: Ability to manage health-related needs will improve Outcome: Progressing   Problem: Skin Integrity: Goal: Risk for impaired skin integrity will decrease Outcome: Progressing   Problem: Tissue Perfusion: Goal: Adequacy of tissue perfusion will improve Outcome: Progressing

## 2023-07-25 NOTE — Inpatient Diabetes Management (Signed)
Inpatient Diabetes Program Recommendations  AACE/ADA: New Consensus Statement on Inpatient Glycemic Control   Target Ranges:  Prepandial:   less than 140 mg/dL      Peak postprandial:   less than 180 mg/dL (1-2 hours)      Critically ill patients:  140 - 180 mg/dL    Latest Reference Range & Units 07/24/23 07:47 07/24/23 12:01 07/24/23 15:54 07/24/23 20:46 07/25/23 07:53  Glucose-Capillary 70 - 99 mg/dL 962 (H) 952 (H) 841 (H) 170 (H) 280 (H)   Review of Glycemic Control  Diabetes history: DM2 Outpatient Diabetes medications: Lantus 20 units daily, Humalog 10 units TID with meals Current orders for Inpatient glycemic control: Semglee 20 units daily, Novolog 0-20 units TID with meals, Novolog 0-5 units at bedtime, Novolog 8 units TID with meals, Farxiga 10 mg daily; Solumedrol 40 mg Q12H  Inpatient Diabetes Program Recommendations:    Insulin: If steroids are continued as ordered, please consider increasing Semglee to 28 units daily and meal coverage to Novolog 10 units TID with meals.  Thanks, Orlando Penner, RN, MSN, CDCES Diabetes Coordinator Inpatient Diabetes Program [phone] (Team Pager from 8am to 5pm)

## 2023-07-25 NOTE — Evaluation (Signed)
Occupational Therapy Evaluation Patient Details Name: Cody Alexander MRN: [id number] DOB: 1956-02-20 Today's Date: 07/25/2023   History of Present Illness Pt is a 67 year old male presenting with SOB, CHF.  Was here ~5 weeks ago with similar  with chest pain and apparent heart attack.   PMH significant for CHF, COPD, CAD, HTN, T2DM, OSA   Clinical Impression   Pt was seen for OT evaluation this date. Prior to hospital admission, pt was living with a 41 year old roommate and her granddaughter/boyfriend. Reports MOD I with ADLs and no use of AD for mobility. Shower chair broke and needs a shower bench.    Pt presents to acute OT demonstrating impaired ADL performance and functional mobility 2/2 low activity tolerance (See OT problem list for additional functional deficits). Pt currently requires SUP for LB dressing of slip on shoes. IND with STS. Extensive education provided on PLB, ECS and use of long handled equipment, e.g. reacher and long handled sponge for LB ADLs d/t increased difficulty and issues breathing. Pt verbalized understanding. Also reports shower chair broke and he will need a shower bench for ease with getting into tub. Sp02 drop to 80's on 4L with activity, otherwise 89-80% at rest.  Pt would benefit from skilled OT services to address noted impairments and functional limitations (see below for any additional details) in order to maximize safety and independence while minimizing falls risk and caregiver burden. Do not anticipate the need for follow up OT services upon acute hospital DC, but will follow acutely.      If plan is discharge home, recommend the following: A little help with bathing/dressing/bathroom    Functional Status Assessment  Patient has had a recent decline in their functional status and demonstrates the ability to make significant improvements in function in a reasonable and predictable amount of time.  Equipment Recommendations  Tub/shower bench (long  handled equipment, reacher, shoe horn, etc.)    Recommendations for Other Services       Precautions / Restrictions Precautions Precautions: Fall Restrictions Weight Bearing Restrictions: No      Mobility Bed Mobility               General bed mobility comments: NT up in recliner pre/post session    Transfers Overall transfer level: Independent Equipment used: None               General transfer comment: IND with STS from recliner      Balance Overall balance assessment: Independent                                         ADL either performed or assessed with clinical judgement   ADL Overall ADL's : Needs assistance/impaired                     Lower Body Dressing: Supervision/safety;Sitting/lateral leans   Toilet Transfer: Radiographer, therapeutic Details (indicate cue type and reason): SIMULATED                 Vision         Perception         Praxis         Pertinent Vitals/Pain Pain Assessment Pain Assessment: No/denies pain     Extremity/Trunk Assessment Upper Extremity Assessment Upper Extremity Assessment: Overall WFL for tasks assessed   Lower Extremity Assessment Lower Extremity Assessment:  Overall WFL for tasks assessed       Communication Communication Communication: Hearing impairment   Cognition Arousal: Alert Behavior During Therapy: WFL for tasks assessed/performed Overall Cognitive Status: Within Functional Limits for tasks assessed                                 General Comments: VERBOSE     General Comments  sp02 biggest limitation; 89% on 4L withr rest drops to 80's with activity    Exercises Other Exercises Other Exercises: Edu in role of OT in acute setting and importance of continued activity to prevent decline. Other Exercises: Edu on use of PLB, compensatory strategies and AE/AD to maximize safety and IND.   Shoulder Instructions      Home  Living Family/patient expects to be discharged to:: Private residence Living Arrangements: Non-relatives/Friends Available Help at Discharge: Available PRN/intermittently;Friend(s)   Home Access: Stairs to enter Entrance Stairs-Number of Steps: 4 Entrance Stairs-Rails: Left Home Layout: One level               Home Equipment: Wheelchair - Forensic psychologist (2 wheels);Cane - single point          Prior Functioning/Environment Prior Level of Function : Independent/Modified Independent;History of Falls (last six months)             Mobility Comments: generally MOD I household/short community distances with AD PRN, rides e-cart at stores, etc ADLs Comments: MOD I for feeding,grooming, dressing, bathing; however reports significant difficulties with LB dressing/bathing at this time; generally MOD I with ADLs however does not drive        OT Problem List: Cardiopulmonary status limiting activity;Decreased activity tolerance      OT Treatment/Interventions: Energy conservation;Patient/family education;Self-care/ADL training    OT Goals(Current goals can be found in the care plan section) Acute Rehab OT Goals Patient Stated Goal: improve breathing OT Goal Formulation: With patient Time For Goal Achievement: 08/08/23 Potential to Achieve Goals: Good ADL Goals Pt Will Perform Lower Body Bathing: with modified independence;sit to/from stand;sitting/lateral leans Pt Will Perform Lower Body Dressing: with modified independence;sitting/lateral leans;sit to/from stand Pt Will Transfer to Toilet: with modified independence;regular height toilet;grab bars Additional ADL Goal #1: Pt will verbalize 1 learned ECS and utilize PLB during ADL performance and mobility to prevent overexertion.  OT Frequency: Min 1X/week    Co-evaluation              AM-PAC OT "6 Clicks" Daily Activity     Outcome Measure Help from another person eating meals?: None Help from another person  taking care of personal grooming?: None Help from another person toileting, which includes using toliet, bedpan, or urinal?: None Help from another person bathing (including washing, rinsing, drying)?: A Little Help from another person to put on and taking off regular upper body clothing?: None Help from another person to put on and taking off regular lower body clothing?: A Little 6 Click Score: 22   End of Session Equipment Utilized During Treatment: Oxygen Nurse Communication: Mobility status  Activity Tolerance: Patient tolerated treatment well Patient left: in chair;with call bell/phone within reach  OT Visit Diagnosis: Other abnormalities of gait and mobility (R26.89)                Time: 8469-6295 OT Time Calculation (min): 29 min Charges:  OT General Charges $OT Visit: 1 Visit OT Evaluation $OT Eval Low Complexity: 1 Low OT Treatments $Therapeutic  Activity: 8-22 mins [facility], OTR/L 07/25/23, 3:31 PM  Constance Goltz 07/25/2023, 3:29 PM

## 2023-07-25 NOTE — Evaluation (Signed)
Physical Therapy Evaluation Patient Details Name: Cody Alexander MRN: [id number] DOB: March 01, 1956 Today's Date: 07/25/2023  History of Present Illness  Pt is a 67 year old male presenting with SOB, CHF.  Was here ~5 weeks ago with similar  with chest pain and apparent heart attack.   PMH significant for CHF, COPD, CAD, HTN, T2DM, OSA  Clinical Impression  Pt did well with mobility and ambulation and was safe and confident for transitions and limited ambulation.  He did have increasing fatigue and as he approached ~100 ft of ambulation and had SpO2 drop to mid/low 80s.  Able to get back to low 90s with 2-3 minutes of seated, focused breathing rest.  Pt feeling confident about his mobility, strength, balance but clearly having respiratory issues that preclude him from managing well at home at this time.  Will maintain on PT caseload to insure activity and monitor activity tolerance gains in preparation for returning home.        If plan is discharge home, recommend the following: Assist for transportation;Help with stairs or ramp for entrance   Can travel by private vehicle        Equipment Recommendations None recommended by PT  Recommendations for Other Services       Functional Status Assessment Patient has had a recent decline in their functional status and demonstrates the ability to make significant improvements in function in a reasonable and predictable amount of time.     Precautions / Restrictions Precautions Precautions: Fall Restrictions Weight Bearing Restrictions: No      Mobility  Bed Mobility Overal bed mobility: Independent                  Transfers Overall transfer level: Independent Equipment used: None               General transfer comment: able to rise w/o assist, good confidence and safety    Ambulation/Gait Ambulation/Gait assistance: Supervision Gait Distance (Feet): 300 Feet Assistive device: None         General Gait  Details: mobile O2 compressor, no UE support.  Pt was able to ambulate with consistent and confident cadence - able to change speed, stop, turn, etc w/o issue.  He did have fatigue with ambulation and needed a seated rest break at ~150 ft.  SpO2 tended to stay in the 88-92% range but with each 150 ft bout did have increasing shortness of breath   and though good pleth was difficult to get appears he did drop to the low 80s as he got to >100 ft with both bouts.  Pt reports no pain but does endorse chest tightness, and increased coughing.  Able to increase SpO2 to low 90s with seated focused breathing.  Stairs            Wheelchair Mobility     Tilt Bed    Modified Rankin (Stroke Patients Only)       Balance Overall balance assessment: Independent                                           Pertinent Vitals/Pain Pain Assessment Pain Assessment: No/denies pain    Home Living Family/patient expects to be discharged to:: Private residence Living Arrangements: Non-relatives/Friends Available Help at Discharge: Available PRN/intermittently;Friend(s)   Home Access: Stairs to enter Entrance Stairs-Rails: Left Entrance Stairs-Number of Steps: 4   Home Layout:  One level Home Equipment: Wheelchair - Forensic psychologist (2 wheels);Cane - single point      Prior Function Prior Level of Function : Independent/Modified Independent;History of Falls (last six months)             Mobility Comments: generally MOD I household/short community distances with AD PRN, rides e-cart at stores, etc       Extremity/Trunk Assessment   Upper Extremity Assessment Upper Extremity Assessment: Overall WFL for tasks assessed    Lower Extremity Assessment Lower Extremity Assessment: Overall WFL for tasks assessed       Communication   Communication Communication: Hearing impairment  Cognition Arousal: Alert Behavior During Therapy: WFL for tasks  assessed/performed Overall Cognitive Status: Within Functional Limits for tasks assessed                                          General Comments General comments (skin integrity, edema, etc.): Pt with safe and confident gait for limited distance but quickly fatigues and has associated SpO2 drops into the low 80s.    Exercises     Assessment/Plan    PT Assessment Patient needs continued PT services  PT Problem List Decreased activity tolerance;Decreased safety awareness;Cardiopulmonary status limiting activity       PT Treatment Interventions DME instruction;Gait training;Stair training;Functional mobility training;Therapeutic activities;Therapeutic exercise;Balance training;Patient/family education    PT Goals (Current goals can be found in the Care Plan section)  Acute Rehab PT Goals Patient Stated Goal: not go home until he is breathing better PT Goal Formulation: With patient Time For Goal Achievement: 08/07/23 Potential to Achieve Goals: Good    Frequency Min 1X/week     Co-evaluation               AM-PAC PT "6 Clicks" Mobility  Outcome Measure Help needed turning from your back to your side while in a flat bed without using bedrails?: None Help needed moving from lying on your back to sitting on the side of a flat bed without using bedrails?: None Help needed moving to and from a bed to a chair (including a wheelchair)?: None Help needed standing up from a chair using your arms (e.g., wheelchair or bedside chair)?: None Help needed to walk in hospital room?: None Help needed climbing 3-5 steps with a railing? : A Little 6 Click Score: 23    End of Session Equipment Utilized During Treatment: Gait belt;Oxygen (4L) Activity Tolerance: Patient limited by fatigue;Patient tolerated treatment well Patient left: in chair;with call bell/phone within reach Nurse Communication: Mobility status (O32) PT Visit Diagnosis: Difficulty in walking, not  elsewhere classified (R26.2)    Time: 1340-1410 PT Time Calculation (min) (ACUTE ONLY): 30 min   Charges:   PT Evaluation $PT Eval Low Complexity: 1 Low PT Treatments $Gait Training: 8-22 mins PT General Charges $$ ACUTE PT VISIT: 1 Visit         Malachi Pro, DPT 07/25/2023, 2:36 PM

## 2023-07-25 NOTE — Progress Notes (Signed)
PROGRESS NOTE    Cody Alexander  GUR:[id number] DOB: 1956-05-30 DOA: 07/23/2023 PCP: [facility]    Brief Narrative:  67 year old male with known history of diastolic congestive heart failure and COPD who presents with progressive shortness of breath and mild bilateral lower extremity weakness.  Shortness of breath is slowly improving.  I suspect this is secondary to more decompensated COPD than CHF.  Mild pulmonary edema noted on chest imaging.  Trace lower extremity edema, BNP minimally elevated.   Will continue to treat for concomitant CHF and COPD.  Continue diuresis to maintain net euvolemia or mild net negative status.  IV steroids, bronchodilators.  Wean oxygen as tolerated.  Nocturnal CPAP.   Assessment & Plan:   Principal Problem:   CHF exacerbation (HCC) Active Problems:   Acute on chronic respiratory failure with hypoxia and hypercapnia (HCC)   Acute on chronic diastolic CHF (congestive heart failure) (HCC)   Hypertensive emergency   COPD with acute exacerbation (HCC)   Elevated troponin   CAD (coronary artery disease)   Uncontrolled type 2 diabetes mellitus with hyperglycemia, with long-term current use of insulin (HCC)   Obesity, Class III, BMI 40-49.9 (morbid obesity) (HCC)   OSA (obstructive sleep apnea)  Acute on chronic respiratory failure with hypoxia and hypercapnia (HCC) Likely secondary to both COPD and CHF exacerbation Weaned successfully off BiPAP Treatment pathways for COPD and CHF as below   Acute on chronic diastolic CHF (congestive heart failure) (HCC) Hypertensive emergency BP 161/110 on arrival Chest x-ray showing pulmonary vascular congestion Plan: No further need for IV diuresis.  Spironolactone and torsemide resumed per home dose.  Continue metoprolol and ACE inhibitor.  Continue with daily weights and strict I's and O's.  COPD with acute exacerbation (HCC) The primary driver for patient's symptoms.  RVP and  COVID-negative. Refractory to therapy thus far Plan: Continue intravenous steroids Aggressive bronchodilator therapy Mucolytic's Wean oxygen as tolerated Antitussives  Elevated troponin CAD with history of stent Troponin elevated to 88, likely demand ischemia Continue home Isordil, metoprolol, lisinopril and atorvastatin   Uncontrolled type 2 diabetes mellitus with hyperglycemia, with long-term current use of insulin (HCC) Basal bolus regimen Carb modified diet   OSA (obstructive sleep apnea) Currently on BiPAP   Obesity, Class III, BMI 40-49.9 (morbid obesity) (HCC) Complicating factor to overall prognosis and care     DVT prophylaxis: SQ Lovenox Code Status: Full Family Communication: None Disposition Plan: Status is: Inpatient Remains inpatient appropriate because: Concomitant CHF and COPD exacerbation   Level of care: Progressive  Consultants:  None  Procedures:  None  Antimicrobials: None   Subjective: Seen and examined.  Mild improvement over interval.  Continues to have significant coughing spells.  Objective: Vitals:   07/25/23 0427 07/25/23 0635 07/25/23 0720 07/25/23 1137  BP: (!) 143/98  (!) 140/91 117/78  Pulse: 93  66 73  Resp: 20  20 18   Temp: 98.8 F (37.1 C)  97.9 F (36.6 C) 97.9 F (36.6 C)  TempSrc: Oral     SpO2: 92% 92% 94% 96%  Weight:      Height:        Intake/Output Summary (Last 24 hours) at 07/25/2023 1257 Last data filed at 07/24/2023 1700 Gross per 24 hour  Intake 240 ml  Output 2200 ml  Net -1960 ml   Filed Weights   07/23/23 0514 07/23/23 0600  Weight: (!) 140.1 kg (!) 136.9 kg    Examination:  General exam: NAD.  Appears fatigued Respiratory system:  Bibasilar crackles.  Mild end expiratory wheeze.  Coarse breath sounds.  Normal work of breathing.  3 L Cardiovascular system: S1-S2, RRR, no murmurs, no pedal edema Gastrointestinal system:, Soft, NT/ND, normal bowel sounds Central nervous system: Alert and  oriented. No focal neurological deficits. Extremities: Symmetric 5 x 5 power. Skin: No rashes, lesions or ulcers Psychiatry: Judgement and insight appear normal. Mood & affect appropriate.     Data Reviewed: I have personally reviewed following labs and imaging studies  CBC: Recent Labs  Lab 07/23/23 0025 07/25/23 0851  WBC 9.4 11.4*  NEUTROABS 6.6 9.8*  HGB 12.0* 14.1  HCT 37.6* 43.8  MCV 90.0 89.8  PLT 257 348   Basic Metabolic Panel: Recent Labs  Lab 07/23/23 0025 07/25/23 0851  NA 135 136  K 4.3 4.0  CL 95* 89*  CO2 32 35*  GLUCOSE 271* 266*  BUN 21 37*  CREATININE 1.15 1.31*  CALCIUM 8.8* 8.8*   GFR: Estimated Creatinine Clearance: 79.5 mL/min (A) (by C-G formula based on SCr of 1.31 mg/dL (H)). Liver Function Tests: Recent Labs  Lab 07/23/23 0025  AST 21  ALT 21  ALKPHOS 55  BILITOT 0.7  PROT 6.5  ALBUMIN 3.4*   No results for input(s): "LIPASE", "AMYLASE" in the last 168 hours. No results for input(s): "AMMONIA" in the last 168 hours. Coagulation Profile: No results for input(s): "INR", "PROTIME" in the last 168 hours. Cardiac Enzymes: No results for input(s): "CKTOTAL", "CKMB", "CKMBINDEX", "TROPONINI" in the last 168 hours. BNP (last 3 results) No results for input(s): "PROBNP" in the last 8760 hours. HbA1C: Recent Labs    07/23/23 0625  HGBA1C 10.5*   CBG: Recent Labs  Lab 07/24/23 1201 07/24/23 1554 07/24/23 2046 07/25/23 0753 07/25/23 1136  GLUCAP 193* 138* 170* 280* 191*   Lipid Profile: No results for input(s): "CHOL", "HDL", "LDLCALC", "TRIG", "CHOLHDL", "LDLDIRECT" in the last 72 hours. Thyroid Function Tests: No results for input(s): "TSH", "T4TOTAL", "FREET4", "T3FREE", "THYROIDAB" in the last 72 hours. Anemia Panel: No results for input(s): "VITAMINB12", "FOLATE", "FERRITIN", "TIBC", "IRON", "RETICCTPCT" in the last 72 hours. Sepsis Labs: Recent Labs  Lab 07/23/23 0025 07/23/23 0625 07/23/23 0819  LATICACIDVEN 1.2  2.2* 1.2    Recent Results (from the past 240 hour(s))  Resp panel by RT-PCR (RSV, Flu A&B, Covid) Anterior Nasal Swab     Status: None   Collection Time: 07/23/23 12:25 AM   Specimen: Anterior Nasal Swab  Result Value Ref Range Status   SARS Coronavirus 2 by RT PCR NEGATIVE NEGATIVE Final    Comment: (NOTE) SARS-CoV-2 target nucleic acids are NOT DETECTED.  The SARS-CoV-2 RNA is generally detectable in upper respiratory specimens during the acute phase of infection. The lowest concentration of SARS-CoV-2 viral copies this assay can detect is 138 copies/mL. A negative result does not preclude SARS-Cov-2 infection and should not be used as the sole basis for treatment or other patient management decisions. A negative result may occur with  improper specimen collection/handling, submission of specimen other than nasopharyngeal swab, presence of viral mutation(s) within the areas targeted by this assay, and inadequate number of viral copies(<138 copies/mL). A negative result must be combined with clinical observations, patient history, and epidemiological information. The expected result is Negative.  Fact Sheet for Patients:  BloggerCourse.com  Fact Sheet for Healthcare Providers:  SeriousBroker.it  This test is no t yet approved or cleared by the [place] FDA and  has been authorized for detection and/or diagnosis of SARS-CoV-2 by  FDA under an Emergency Use Authorization (EUA). This EUA will remain  in effect (meaning this test can be used) for the duration of the COVID-19 declaration under Section 564(b)(1) of the Act, 21 U.S.C.section 360bbb-3(b)(1), unless the authorization is terminated  or revoked sooner.       Influenza A by PCR NEGATIVE NEGATIVE Final   Influenza B by PCR NEGATIVE NEGATIVE Final    Comment: (NOTE) The Xpert Xpress SARS-CoV-2/FLU/RSV plus assay is intended as an aid in the diagnosis of influenza  from Nasopharyngeal swab specimens and should not be used as a sole basis for treatment. Nasal washings and aspirates are unacceptable for Xpert Xpress SARS-CoV-2/FLU/RSV testing.  Fact Sheet for Patients: BloggerCourse.com  Fact Sheet for Healthcare Providers: SeriousBroker.it  This test is not yet approved or cleared by the [place] FDA and has been authorized for detection and/or diagnosis of SARS-CoV-2 by FDA under an Emergency Use Authorization (EUA). This EUA will remain in effect (meaning this test can be used) for the duration of the COVID-19 declaration under Section 564(b)(1) of the Act, 21 U.S.C. section 360bbb-3(b)(1), unless the authorization is terminated or revoked.     Resp Syncytial Virus by PCR NEGATIVE NEGATIVE Final    Comment: (NOTE) Fact Sheet for Patients: BloggerCourse.com  Fact Sheet for Healthcare Providers: SeriousBroker.it  This test is not yet approved or cleared by the [place] FDA and has been authorized for detection and/or diagnosis of SARS-CoV-2 by FDA under an Emergency Use Authorization (EUA). This EUA will remain in effect (meaning this test can be used) for the duration of the COVID-19 declaration under Section 564(b)(1) of the Act, 21 U.S.C. section 360bbb-3(b)(1), unless the authorization is terminated or revoked.  Performed at [facility], [address] [place], [place] [address]   Blood culture (routine x 2)     Status: None (Preliminary result)   Collection Time: 07/23/23 12:25 AM   Specimen: BLOOD  Result Value Ref Range Status   Specimen Description BLOOD BLOOD LEFT ARM  Final   Special Requests   Final    BOTTLES DRAWN AEROBIC AND ANAEROBIC Blood Culture results may not be optimal due to an excessive volume of blood received in culture bottles   Culture   Final    NO GROWTH 2 DAYS Performed at [facility], [address], [place], [place] [address]    Report Status PENDING  Incomplete  Culture, blood (Routine X 2) w Reflex to ID Panel     Status: None (Preliminary result)   Collection Time: 07/23/23  8:19 AM   Specimen: BLOOD RIGHT ARM  Result Value Ref Range Status   Specimen Description BLOOD RIGHT ARM  Final   Special Requests   Final    BOTTLES DRAWN AEROBIC AND ANAEROBIC Blood Culture adequate volume   Culture   Final    NO GROWTH 2 DAYS Performed at [facility], [address] [place], [place] [address]    Report Status PENDING  Incomplete  Respiratory (~20 pathogens) panel by PCR     Status: None   Collection Time: 07/23/23  9:30 AM   Specimen: Nasopharyngeal Swab; Respiratory  Result Value Ref Range Status   Adenovirus NOT DETECTED NOT DETECTED Final   Coronavirus 229E NOT DETECTED NOT DETECTED Final    Comment: (NOTE) The Coronavirus on the Respiratory Panel, DOES NOT test for the novel  Coronavirus (2019 nCoV)    Coronavirus HKU1 NOT DETECTED NOT DETECTED Final   Coronavirus NL63  NOT DETECTED NOT DETECTED Final   Coronavirus OC43 NOT DETECTED NOT DETECTED Final   Metapneumovirus NOT DETECTED NOT DETECTED Final   Rhinovirus / Enterovirus NOT DETECTED NOT DETECTED Final   Influenza A NOT DETECTED NOT DETECTED Final   Influenza B NOT DETECTED NOT DETECTED Final   Parainfluenza Virus 1 NOT DETECTED NOT DETECTED Final   Parainfluenza Virus 2 NOT DETECTED NOT DETECTED Final   Parainfluenza Virus 3 NOT DETECTED NOT DETECTED Final   Parainfluenza Virus 4 NOT DETECTED NOT DETECTED Final   Respiratory Syncytial Virus NOT DETECTED NOT DETECTED Final   Bordetella pertussis NOT DETECTED NOT DETECTED Final   Bordetella Parapertussis NOT DETECTED NOT DETECTED Final   Chlamydophila pneumoniae NOT DETECTED NOT DETECTED Final   Mycoplasma pneumoniae NOT DETECTED NOT DETECTED Final    Comment: Performed at [facility], [address] [place],  [place] [address]         Radiology Studies: No results found.      Scheduled Meds:  amoxicillin-clavulanate  1 tablet Oral Q12H   arformoterol  15 mcg Nebulization BID   aspirin EC  81 mg Oral Daily   atorvastatin  80 mg Oral Daily   chlorpheniramine-HYDROcodone  5 mL Oral Q6H   dapagliflozin propanediol  10 mg Oral Daily   enoxaparin (LOVENOX) injection  70 mg Subcutaneous Q24H   guaiFENesin  1,200 mg Oral BID   insulin aspart  0-20 Units Subcutaneous TID WC   insulin aspart  0-5 Units Subcutaneous QHS   insulin aspart  10 Units Subcutaneous TID WC   [START ON 07/26/2023] insulin glargine-yfgn  28 Units Subcutaneous Daily   ipratropium-albuterol  3 mL Nebulization Q6H   lisinopril  20 mg Oral Daily   magnesium oxide  400 mg Oral BID   methylPREDNISolone (SOLU-MEDROL) injection  40 mg Intravenous Q12H   metoprolol succinate  25 mg Oral Daily   pantoprazole  40 mg Oral Daily   spironolactone  25 mg Oral Daily   torsemide  40 mg Oral Daily   Continuous Infusions:   LOS: 2 days     Tresa Moore, MD [facility]   If 7PM-7AM, please contact night-coverage  07/25/2023, 12:57 PM

## 2023-07-25 NOTE — TOC Progression Note (Signed)
Transition of Care [facility]) - Progression Note    Patient Details  Name: Cody Alexander MRN: [id number] Date of Birth: 29-Jan-1956  Transition of [facility]) CM/SW Contact  Truddie Hidden, RN Phone Number: 07/25/2023, 2:07 PM  Clinical Narrative:    TOC continuing to follow patient's progress throughout discharge planning.   Expected Discharge Plan: Home w Home Health Services Barriers to Discharge: Continued Medical Work up  Expected Discharge Plan and Services     Post Acute Care Choice: Resumption of Svcs/PTA Provider Living arrangements for the past 2 months: Single Family Home                           HH Arranged: RN, PT, OT, Nurse's Aide HH Agency: [facility] Date [facility] Agency Contacted: 07/23/23   Representative spoke with at [facility] Agency: [facility]   Social Determinants of Health (SDOH) Interventions SDOH Screenings   Food Insecurity: No Food Insecurity (07/23/2023)  Housing: Medium Risk (07/23/2023)  Transportation Needs: Unmet Transportation Needs (07/23/2023)  Utilities: Not At Risk (07/23/2023)  Alcohol Screen: Low Risk  (07/23/2023)  Financial Resource Strain: Low Risk  (07/23/2023)  Physical Activity: Inactive (07/02/2021)   Received from [facility], [facility]  Social Connections: Moderately Isolated (10/21/2022)   Received from [facility], [facility]  Stress: Stress Concern Present (10/21/2022)   Received from [facility], [facility]  Tobacco Use: Medium Risk (07/23/2023)  Health Literacy: Medium Risk (10/21/2022)   Received from [facility], [facility]    Readmission Risk Interventions    07/23/2023    1:58 PM 06/25/2022   10:13 AM  Readmission Risk Prevention Plan  Transportation Screening Complete Complete  PCP or Specialist Appt within 3-5 Days Complete Complete  HRI or Home Care Consult Complete Complete  Social Work Consult for Recovery Care Planning/Counseling Complete Complete   Palliative Care Screening Not Applicable Not Applicable  Medication Review [employer]) Complete Complete

## 2023-07-26 DIAGNOSIS — I5033 Acute on chronic diastolic (congestive) heart failure: Secondary | ICD-10-CM | POA: Diagnosis not present

## 2023-07-26 LAB — GLUCOSE, CAPILLARY
Glucose-Capillary: 267 mg/dL — ABNORMAL HIGH (ref 70–99)
Glucose-Capillary: 281 mg/dL — ABNORMAL HIGH (ref 70–99)
Glucose-Capillary: 231 mg/dL — ABNORMAL HIGH (ref 70–99)
Glucose-Capillary: 130 mg/dL — ABNORMAL HIGH (ref 70–99)

## 2023-07-26 MED ORDER — IPRATROPIUM-ALBUTEROL 0.5-2.5 (3) MG/3ML IN SOLN
3.0000 mL | Freq: Two times a day (BID) | RESPIRATORY_TRACT | Status: DC
  Administered 2023-07-26 – 2023-07-30 (×8): 3 mL via RESPIRATORY_TRACT
  Filled 2023-07-26 (×8): qty 3

## 2023-07-26 NOTE — Progress Notes (Signed)
Pt refuses to wear non skid socks while ambulating in his room and hallway. Will continue to monitor.

## 2023-07-26 NOTE — Plan of Care (Signed)
  Problem: Coping: Goal: Ability to adjust to condition or change in health will improve Outcome: Progressing   Problem: Fluid Volume: Goal: Ability to maintain a balanced intake and output will improve Outcome: Progressing   Problem: Health Behavior/Discharge Planning: Goal: Ability to identify and utilize available resources and services will improve Outcome: Progressing Goal: Ability to manage health-related needs will improve Outcome: Progressing   Problem: Nutritional: Goal: Maintenance of adequate nutrition will improve Outcome: Progressing

## 2023-07-26 NOTE — Progress Notes (Signed)
PROGRESS NOTE    Cody Alexander  NWG:[id number] DOB: 24-May-1956 DOA: 07/23/2023 PCP: [facility]    Brief Narrative:  67 year old male with known history of diastolic congestive heart failure and COPD who presents with progressive shortness of breath and mild bilateral lower extremity weakness.  Shortness of breath is slowly improving.  I suspect this is secondary to more decompensated COPD than CHF.  Mild pulmonary edema noted on chest imaging.  Trace lower extremity edema, BNP minimally elevated.   Will continue to treat for concomitant CHF and COPD.  Continue diuresis to maintain net euvolemia or mild net negative status.  IV steroids, bronchodilators.  Wean oxygen as tolerated.  Nocturnal CPAP.   Assessment & Plan:   Principal Problem:   CHF exacerbation (HCC) Active Problems:   Acute on chronic respiratory failure with hypoxia and hypercapnia (HCC)   Acute on chronic diastolic CHF (congestive heart failure) (HCC)   Hypertensive emergency   COPD with acute exacerbation (HCC)   Elevated troponin   CAD (coronary artery disease)   Uncontrolled type 2 diabetes mellitus with hyperglycemia, with long-term current use of insulin (HCC)   Obesity, Class III, BMI 40-49.9 (morbid obesity) (HCC)   OSA (obstructive sleep apnea)  Acute on chronic respiratory failure with hypoxia and hypercapnia (HCC) Likely secondary to both COPD and CHF exacerbation Weaned successfully off BiPAP Treatment pathways for COPD and CHF as below   Acute on chronic diastolic CHF (congestive heart failure) (HCC) Hypertensive emergency BP 161/110 on arrival Chest x-ray showing pulmonary vascular congestion Plan: No further need for IV diuresis.   Continue Aldactone and torsemide per home dose Continue metoprolol and ACE inhibitor per home dose Continue with daily weights, strict I's and O's  COPD with acute exacerbation (HCC) The primary driver for patient's symptoms.  RVP and  COVID-negative. Refractory to therapy thus far Plan: Continue intravenous steroids Aggressive bronchodilator therapy Mucolytic's Wean oxygen as tolerated Antitussives  Elevated troponin CAD with history of stent Troponin elevated to 88, likely demand ischemia Continue home Isordil, metoprolol, lisinopril and atorvastatin   Uncontrolled type 2 diabetes mellitus with hyperglycemia, with long-term current use of insulin (HCC) Basal bolus regimen Carb modified diet   OSA (obstructive sleep apnea) Currently on BiPAP   Obesity, Class III, BMI 40-49.9 (morbid obesity) (HCC) Complicating factor to overall prognosis and care     DVT prophylaxis: SQ Lovenox Code Status: Full Family Communication: None Disposition Plan: Status is: Inpatient Remains inpatient appropriate because: Concomitant CHF and COPD exacerbation   Level of care: Progressive  Consultants:  None  Procedures:  None  Antimicrobials: None   Subjective: Seen and examined.  Bit more lethargic but breathing appears improved  Objective: Vitals:   07/26/23 0150 07/26/23 0439 07/26/23 0731 07/26/23 0740  BP:  (!) 145/95 (!) 109/91   Pulse:  84 70   Resp:  20 18   Temp:  97.9 F (36.6 C) 98 F (36.7 C)   TempSrc:  Oral Oral   SpO2: 96% 93% 95% 91%  Weight:      Height:        Intake/Output Summary (Last 24 hours) at 07/26/2023 1136 Last data filed at 07/26/2023 0439 Gross per 24 hour  Intake --  Output 800 ml  Net -800 ml   Filed Weights   07/23/23 0514 07/23/23 0600  Weight: (!) 140.1 kg (!) 136.9 kg    Examination:  General exam: No acute distress.  Appears fatigued Respiratory system: Bibasilar crackles.  Mild end  expiratory wheeze.  Normal work of breathing.  3 L Cardiovascular system: S1-S2, RRR, no murmurs, no pedal edema Gastrointestinal system:, Soft, NT/ND, normal bowel sounds Central nervous system: Alert and oriented. No focal neurological deficits. Extremities: Symmetric 5 x  5 power. Skin: No rashes, lesions or ulcers Psychiatry: Judgement and insight appear normal. Mood & affect appropriate.     Data Reviewed: I have personally reviewed following labs and imaging studies  CBC: Recent Labs  Lab 07/23/23 0025 07/25/23 0851  WBC 9.4 11.4*  NEUTROABS 6.6 9.8*  HGB 12.0* 14.1  HCT 37.6* 43.8  MCV 90.0 89.8  PLT 257 348   Basic Metabolic Panel: Recent Labs  Lab 07/23/23 0025 07/25/23 0851  NA 135 136  K 4.3 4.0  CL 95* 89*  CO2 32 35*  GLUCOSE 271* 266*  BUN 21 37*  CREATININE 1.15 1.31*  CALCIUM 8.8* 8.8*   GFR: Estimated Creatinine Clearance: 79.5 mL/min (A) (by C-G formula based on SCr of 1.31 mg/dL (H)). Liver Function Tests: Recent Labs  Lab 07/23/23 0025  AST 21  ALT 21  ALKPHOS 55  BILITOT 0.7  PROT 6.5  ALBUMIN 3.4*   No results for input(s): "LIPASE", "AMYLASE" in the last 168 hours. No results for input(s): "AMMONIA" in the last 168 hours. Coagulation Profile: No results for input(s): "INR", "PROTIME" in the last 168 hours. Cardiac Enzymes: No results for input(s): "CKTOTAL", "CKMB", "CKMBINDEX", "TROPONINI" in the last 168 hours. BNP (last 3 results) No results for input(s): "PROBNP" in the last 8760 hours. HbA1C: No results for input(s): "HGBA1C" in the last 72 hours.  CBG: Recent Labs  Lab 07/25/23 0753 07/25/23 1136 07/25/23 1551 07/25/23 2045 07/26/23 0732  GLUCAP 280* 191* 152* 185* 267*   Lipid Profile: No results for input(s): "CHOL", "HDL", "LDLCALC", "TRIG", "CHOLHDL", "LDLDIRECT" in the last 72 hours. Thyroid Function Tests: No results for input(s): "TSH", "T4TOTAL", "FREET4", "T3FREE", "THYROIDAB" in the last 72 hours. Anemia Panel: No results for input(s): "VITAMINB12", "FOLATE", "FERRITIN", "TIBC", "IRON", "RETICCTPCT" in the last 72 hours. Sepsis Labs: Recent Labs  Lab 07/23/23 0025 07/23/23 0625 07/23/23 0819  LATICACIDVEN 1.2 2.2* 1.2    Recent Results (from the past 240 hour(s))   Resp panel by RT-PCR (RSV, Flu A&B, Covid) Anterior Nasal Swab     Status: None   Collection Time: 07/23/23 12:25 AM   Specimen: Anterior Nasal Swab  Result Value Ref Range Status   SARS Coronavirus 2 by RT PCR NEGATIVE NEGATIVE Final    Comment: (NOTE) SARS-CoV-2 target nucleic acids are NOT DETECTED.  The SARS-CoV-2 RNA is generally detectable in upper respiratory specimens during the acute phase of infection. The lowest concentration of SARS-CoV-2 viral copies this assay can detect is 138 copies/mL. A negative result does not preclude SARS-Cov-2 infection and should not be used as the sole basis for treatment or other patient management decisions. A negative result may occur with  improper specimen collection/handling, submission of specimen other than nasopharyngeal swab, presence of viral mutation(s) within the areas targeted by this assay, and inadequate number of viral copies(<138 copies/mL). A negative result must be combined with clinical observations, patient history, and epidemiological information. The expected result is Negative.  Fact Sheet for Patients:  BloggerCourse.com  Fact Sheet for Healthcare Providers:  SeriousBroker.it  This test is no t yet approved or cleared by the [place] FDA and  has been authorized for detection and/or diagnosis of SARS-CoV-2 by FDA under an Emergency Use Authorization (EUA). This EUA will  remain  in effect (meaning this test can be used) for the duration of the COVID-19 declaration under Section 564(b)(1) of the Act, 21 U.S.C.section 360bbb-3(b)(1), unless the authorization is terminated  or revoked sooner.       Influenza A by PCR NEGATIVE NEGATIVE Final   Influenza B by PCR NEGATIVE NEGATIVE Final    Comment: (NOTE) The Xpert Xpress SARS-CoV-2/FLU/RSV plus assay is intended as an aid in the diagnosis of influenza from Nasopharyngeal swab specimens and should not be used  as a sole basis for treatment. Nasal washings and aspirates are unacceptable for Xpert Xpress SARS-CoV-2/FLU/RSV testing.  Fact Sheet for Patients: BloggerCourse.com  Fact Sheet for Healthcare Providers: SeriousBroker.it  This test is not yet approved or cleared by the [place] FDA and has been authorized for detection and/or diagnosis of SARS-CoV-2 by FDA under an Emergency Use Authorization (EUA). This EUA will remain in effect (meaning this test can be used) for the duration of the COVID-19 declaration under Section 564(b)(1) of the Act, 21 U.S.C. section 360bbb-3(b)(1), unless the authorization is terminated or revoked.     Resp Syncytial Virus by PCR NEGATIVE NEGATIVE Final    Comment: (NOTE) Fact Sheet for Patients: BloggerCourse.com  Fact Sheet for Healthcare Providers: SeriousBroker.it  This test is not yet approved or cleared by the [place] FDA and has been authorized for detection and/or diagnosis of SARS-CoV-2 by FDA under an Emergency Use Authorization (EUA). This EUA will remain in effect (meaning this test can be used) for the duration of the COVID-19 declaration under Section 564(b)(1) of the Act, 21 U.S.C. section 360bbb-3(b)(1), unless the authorization is terminated or revoked.  Performed at [facility], [address] [place], [place] [address]   Blood culture (routine x 2)     Status: None (Preliminary result)   Collection Time: 07/23/23 12:25 AM   Specimen: BLOOD  Result Value Ref Range Status   Specimen Description BLOOD BLOOD LEFT ARM  Final   Special Requests   Final    BOTTLES DRAWN AEROBIC AND ANAEROBIC Blood Culture results may not be optimal due to an excessive volume of blood received in culture bottles   Culture   Final    NO GROWTH 3 DAYS Performed at [facility], [address] [place], [place] [address]     Report Status PENDING  Incomplete  Culture, blood (Routine X 2) w Reflex to ID Panel     Status: None (Preliminary result)   Collection Time: 07/23/23  8:19 AM   Specimen: BLOOD RIGHT ARM  Result Value Ref Range Status   Specimen Description BLOOD RIGHT ARM  Final   Special Requests   Final    BOTTLES DRAWN AEROBIC AND ANAEROBIC Blood Culture adequate volume   Culture   Final    NO GROWTH 3 DAYS Performed at [facility], [address], [place], [place] [address]    Report Status PENDING  Incomplete  Respiratory (~20 pathogens) panel by PCR     Status: None   Collection Time: 07/23/23  9:30 AM   Specimen: Nasopharyngeal Swab; Respiratory  Result Value Ref Range Status   Adenovirus NOT DETECTED NOT DETECTED Final   Coronavirus 229E NOT DETECTED NOT DETECTED Final    Comment: (NOTE) The Coronavirus on the Respiratory Panel, DOES NOT test for the novel  Coronavirus (2019 nCoV)    Coronavirus HKU1 NOT DETECTED NOT DETECTED Final   Coronavirus NL63 NOT DETECTED NOT DETECTED Final   Coronavirus OC43 NOT  DETECTED NOT DETECTED Final   Metapneumovirus NOT DETECTED NOT DETECTED Final   Rhinovirus / Enterovirus NOT DETECTED NOT DETECTED Final   Influenza A NOT DETECTED NOT DETECTED Final   Influenza B NOT DETECTED NOT DETECTED Final   Parainfluenza Virus 1 NOT DETECTED NOT DETECTED Final   Parainfluenza Virus 2 NOT DETECTED NOT DETECTED Final   Parainfluenza Virus 3 NOT DETECTED NOT DETECTED Final   Parainfluenza Virus 4 NOT DETECTED NOT DETECTED Final   Respiratory Syncytial Virus NOT DETECTED NOT DETECTED Final   Bordetella pertussis NOT DETECTED NOT DETECTED Final   Bordetella Parapertussis NOT DETECTED NOT DETECTED Final   Chlamydophila pneumoniae NOT DETECTED NOT DETECTED Final   Mycoplasma pneumoniae NOT DETECTED NOT DETECTED Final    Comment: Performed at [facility], [address] [place], [place] [address]         Radiology Studies: No results  found.      Scheduled Meds:  amoxicillin-clavulanate  1 tablet Oral Q12H   arformoterol  15 mcg Nebulization BID   aspirin EC  81 mg Oral Daily   atorvastatin  80 mg Oral Daily   chlorpheniramine-HYDROcodone  5 mL Oral Q6H   dapagliflozin propanediol  10 mg Oral Daily   enoxaparin (LOVENOX) injection  70 mg Subcutaneous Q24H   guaiFENesin  1,200 mg Oral BID   insulin aspart  0-20 Units Subcutaneous TID WC   insulin aspart  0-5 Units Subcutaneous QHS   insulin aspart  10 Units Subcutaneous TID WC   insulin glargine-yfgn  28 Units Subcutaneous Daily   ipratropium-albuterol  3 mL Nebulization BID   lisinopril  20 mg Oral Daily   magnesium oxide  400 mg Oral BID   methylPREDNISolone (SOLU-MEDROL) injection  40 mg Intravenous Q12H   metoprolol succinate  25 mg Oral Daily   pantoprazole  40 mg Oral Daily   spironolactone  25 mg Oral Daily   torsemide  40 mg Oral Daily   Continuous Infusions:   LOS: 3 days     Tresa Moore, MD [facility]   If 7PM-7AM, please contact night-coverage  07/26/2023, 11:36 AM

## 2023-07-27 DIAGNOSIS — I5033 Acute on chronic diastolic (congestive) heart failure: Secondary | ICD-10-CM | POA: Diagnosis not present

## 2023-07-27 LAB — GLUCOSE, CAPILLARY
Glucose-Capillary: 285 mg/dL — ABNORMAL HIGH (ref 70–99)
Glucose-Capillary: 263 mg/dL — ABNORMAL HIGH (ref 70–99)
Glucose-Capillary: 117 mg/dL — ABNORMAL HIGH (ref 70–99)
Glucose-Capillary: 140 mg/dL — ABNORMAL HIGH (ref 70–99)

## 2023-07-27 MED ORDER — METHYLPREDNISOLONE SODIUM SUCC 40 MG IJ SOLR
40.0000 mg | Freq: Every day | INTRAMUSCULAR | Status: DC
  Administered 2023-07-28 – 2023-07-29 (×2): 40 mg via INTRAVENOUS
  Filled 2023-07-27 (×2): qty 1

## 2023-07-27 MED ORDER — HYDROCOD POLI-CHLORPHE POLI ER 10-8 MG/5ML PO SUER
5.0000 mL | ORAL | Status: DC
  Administered 2023-07-27 – 2023-07-31 (×25): 5 mL via ORAL
  Filled 2023-07-27 (×25): qty 5

## 2023-07-27 MED ORDER — PNEUMOCOCCAL 20-VAL CONJ VACC 0.5 ML IM SUSY
0.5000 mL | PREFILLED_SYRINGE | INTRAMUSCULAR | Status: AC
  Administered 2023-07-28: 0.5 mL via INTRAMUSCULAR
  Filled 2023-07-27: qty 0.5

## 2023-07-27 NOTE — Progress Notes (Signed)
PROGRESS NOTE    Cody Alexander  GLO:[id number] DOB: 09/03/1955 DOA: 07/23/2023 PCP: [facility]    Brief Narrative:  67 year old male with known history of diastolic congestive heart failure and COPD who presents with progressive shortness of breath and mild bilateral lower extremity weakness.  Shortness of breath is slowly improving.  I suspect this is secondary to more decompensated COPD than CHF.  Mild pulmonary edema noted on chest imaging.  Trace lower extremity edema, BNP minimally elevated.   Will continue to treat for concomitant CHF and COPD.  Continue diuresis to maintain net euvolemia or mild net negative status.  IV steroids, bronchodilators.  Wean oxygen as tolerated.  Nocturnal CPAP.   Assessment & Plan:   Principal Problem:   CHF exacerbation (HCC) Active Problems:   Acute on chronic respiratory failure with hypoxia and hypercapnia (HCC)   Acute on chronic diastolic CHF (congestive heart failure) (HCC)   Hypertensive emergency   COPD with acute exacerbation (HCC)   Elevated troponin   CAD (coronary artery disease)   Uncontrolled type 2 diabetes mellitus with hyperglycemia, with long-term current use of insulin (HCC)   Obesity, Class III, BMI 40-49.9 (morbid obesity) (HCC)   OSA (obstructive sleep apnea)  Acute on chronic respiratory failure with hypoxia and hypercapnia (HCC) Likely secondary to both COPD and CHF exacerbation Weaned successfully off BiPAP Treatment pathways for COPD and CHF as below   Acute on chronic diastolic CHF (congestive heart failure) (HCC) Hypertensive emergency BP 161/110 on arrival Chest x-ray showing pulmonary vascular congestion Plan: No further need for IV diuresis.   Continue Aldactone and torsemide per home dose Continue metoprolol and ACE inhibitor per home dose Continue with daily weights, strict I's and O's Maintain net negative to euvolemia  COPD with acute exacerbation (HCC) The primary driver for  patient's symptoms.  RVP and COVID-negative. Refractory to therapy thus far Plan: Continue intravenous steroids, dose decreased to 40 mg once daily Aggressive bronchodilator therapy Mucolytic's Wean oxygen as tolerated Antitussives  Elevated troponin CAD with history of stent Troponin elevated to 88, likely demand ischemia Continue home Isordil, metoprolol, lisinopril and atorvastatin   Uncontrolled type 2 diabetes mellitus with hyperglycemia, with long-term current use of insulin (HCC) Basal bolus regimen Carb modified diet   OSA (obstructive sleep apnea) Currently on BiPAP   Obesity, Class III, BMI 40-49.9 (morbid obesity) (HCC) Complicating factor to overall prognosis and care     DVT prophylaxis: SQ Lovenox Code Status: Full Family Communication: None Disposition Plan: Status is: Inpatient Remains inpatient appropriate because: Concomitant CHF and COPD exacerbation   Level of care: Progressive  Consultants:  None  Procedures:  None  Antimicrobials: None   Subjective: Seen and examined.  In good spirits.  Breathing improved.  Still endorses sore throat.  Objective: Vitals:   07/26/23 2235 07/27/23 0512 07/27/23 0725 07/27/23 0750  BP: 110/72 (!) 133/96  129/89  Pulse: 82 64  68  Resp: 20 20  15   Temp: 97.9 F (36.6 C) 97.7 F (36.5 C)  97.6 F (36.4 C)  TempSrc: Oral Oral    SpO2: 92% 92% 94% 93%  Weight:  130.7 kg    Height:        Intake/Output Summary (Last 24 hours) at 07/27/2023 1137 Last data filed at 07/27/2023 0800 Gross per 24 hour  Intake 720 ml  Output 1825 ml  Net -1105 ml   Filed Weights   07/23/23 0514 07/23/23 0600 07/27/23 0512  Weight: (!) 140.1 kg Marland Kitchen)  136.9 kg 130.7 kg    Examination:  General exam: No distress Respiratory system: Mild bibasilar crackles.  No wheeze.  Normal work of breathing.  2 L Cardiovascular system: S1-S2, RRR, no murmurs, no pedal edema Gastrointestinal system:, Soft, NT/ND, normal bowel  sounds Central nervous system: Alert and oriented. No focal neurological deficits. Extremities: Symmetric 5 x 5 power. Skin: No rashes, lesions or ulcers Psychiatry: Judgement and insight appear normal. Mood & affect appropriate.     Data Reviewed: I have personally reviewed following labs and imaging studies  CBC: Recent Labs  Lab 07/23/23 0025 07/25/23 0851  WBC 9.4 11.4*  NEUTROABS 6.6 9.8*  HGB 12.0* 14.1  HCT 37.6* 43.8  MCV 90.0 89.8  PLT 257 348   Basic Metabolic Panel: Recent Labs  Lab 07/23/23 0025 07/25/23 0851  NA 135 136  K 4.3 4.0  CL 95* 89*  CO2 32 35*  GLUCOSE 271* 266*  BUN 21 37*  CREATININE 1.15 1.31*  CALCIUM 8.8* 8.8*   GFR: Estimated Creatinine Clearance: 77.5 mL/min (A) (by C-G formula based on SCr of 1.31 mg/dL (H)). Liver Function Tests: Recent Labs  Lab 07/23/23 0025  AST 21  ALT 21  ALKPHOS 55  BILITOT 0.7  PROT 6.5  ALBUMIN 3.4*   No results for input(s): "LIPASE", "AMYLASE" in the last 168 hours. No results for input(s): "AMMONIA" in the last 168 hours. Coagulation Profile: No results for input(s): "INR", "PROTIME" in the last 168 hours. Cardiac Enzymes: No results for input(s): "CKTOTAL", "CKMB", "CKMBINDEX", "TROPONINI" in the last 168 hours. BNP (last 3 results) No results for input(s): "PROBNP" in the last 8760 hours. HbA1C: No results for input(s): "HGBA1C" in the last 72 hours.  CBG: Recent Labs  Lab 07/26/23 0732 07/26/23 1151 07/26/23 1523 07/26/23 2056 07/27/23 0756  GLUCAP 267* 281* 231* 130* 285*   Lipid Profile: No results for input(s): "CHOL", "HDL", "LDLCALC", "TRIG", "CHOLHDL", "LDLDIRECT" in the last 72 hours. Thyroid Function Tests: No results for input(s): "TSH", "T4TOTAL", "FREET4", "T3FREE", "THYROIDAB" in the last 72 hours. Anemia Panel: No results for input(s): "VITAMINB12", "FOLATE", "FERRITIN", "TIBC", "IRON", "RETICCTPCT" in the last 72 hours. Sepsis Labs: Recent Labs  Lab  07/23/23 0025 07/23/23 0625 07/23/23 0819  LATICACIDVEN 1.2 2.2* 1.2    Recent Results (from the past 240 hour(s))  Resp panel by RT-PCR (RSV, Flu A&B, Covid) Anterior Nasal Swab     Status: None   Collection Time: 07/23/23 12:25 AM   Specimen: Anterior Nasal Swab  Result Value Ref Range Status   SARS Coronavirus 2 by RT PCR NEGATIVE NEGATIVE Final    Comment: (NOTE) SARS-CoV-2 target nucleic acids are NOT DETECTED.  The SARS-CoV-2 RNA is generally detectable in upper respiratory specimens during the acute phase of infection. The lowest concentration of SARS-CoV-2 viral copies this assay can detect is 138 copies/mL. A negative result does not preclude SARS-Cov-2 infection and should not be used as the sole basis for treatment or other patient management decisions. A negative result may occur with  improper specimen collection/handling, submission of specimen other than nasopharyngeal swab, presence of viral mutation(s) within the areas targeted by this assay, and inadequate number of viral copies(<138 copies/mL). A negative result must be combined with clinical observations, patient history, and epidemiological information. The expected result is Negative.  Fact Sheet for Patients:  BloggerCourse.com  Fact Sheet for Healthcare Providers:  SeriousBroker.it  This test is no t yet approved or cleared by the [place] FDA and  has  been authorized for detection and/or diagnosis of SARS-CoV-2 by FDA under an Emergency Use Authorization (EUA). This EUA will remain  in effect (meaning this test can be used) for the duration of the COVID-19 declaration under Section 564(b)(1) of the Act, 21 U.S.C.section 360bbb-3(b)(1), unless the authorization is terminated  or revoked sooner.       Influenza A by PCR NEGATIVE NEGATIVE Final   Influenza B by PCR NEGATIVE NEGATIVE Final    Comment: (NOTE) The Xpert Xpress SARS-CoV-2/FLU/RSV  plus assay is intended as an aid in the diagnosis of influenza from Nasopharyngeal swab specimens and should not be used as a sole basis for treatment. Nasal washings and aspirates are unacceptable for Xpert Xpress SARS-CoV-2/FLU/RSV testing.  Fact Sheet for Patients: BloggerCourse.com  Fact Sheet for Healthcare Providers: SeriousBroker.it  This test is not yet approved or cleared by the [place] FDA and has been authorized for detection and/or diagnosis of SARS-CoV-2 by FDA under an Emergency Use Authorization (EUA). This EUA will remain in effect (meaning this test can be used) for the duration of the COVID-19 declaration under Section 564(b)(1) of the Act, 21 U.S.C. section 360bbb-3(b)(1), unless the authorization is terminated or revoked.     Resp Syncytial Virus by PCR NEGATIVE NEGATIVE Final    Comment: (NOTE) Fact Sheet for Patients: BloggerCourse.com  Fact Sheet for Healthcare Providers: SeriousBroker.it  This test is not yet approved or cleared by the [place] FDA and has been authorized for detection and/or diagnosis of SARS-CoV-2 by FDA under an Emergency Use Authorization (EUA). This EUA will remain in effect (meaning this test can be used) for the duration of the COVID-19 declaration under Section 564(b)(1) of the Act, 21 U.S.C. section 360bbb-3(b)(1), unless the authorization is terminated or revoked.  Performed at [facility], [address] [place], [place] [address]   Blood culture (routine x 2)     Status: None (Preliminary result)   Collection Time: 07/23/23 12:25 AM   Specimen: BLOOD  Result Value Ref Range Status   Specimen Description BLOOD BLOOD LEFT ARM  Final   Special Requests   Final    BOTTLES DRAWN AEROBIC AND ANAEROBIC Blood Culture results may not be optimal due to an excessive volume of blood received in culture bottles    Culture   Final    NO GROWTH 4 DAYS Performed at [facility], [address] [place], [place] [address]    Report Status PENDING  Incomplete  Culture, blood (Routine X 2) w Reflex to ID Panel     Status: None (Preliminary result)   Collection Time: 07/23/23  8:19 AM   Specimen: BLOOD RIGHT ARM  Result Value Ref Range Status   Specimen Description BLOOD RIGHT ARM  Final   Special Requests   Final    BOTTLES DRAWN AEROBIC AND ANAEROBIC Blood Culture adequate volume   Culture   Final    NO GROWTH 4 DAYS Performed at [facility], [address] [place], [place] [address]    Report Status PENDING  Incomplete  Respiratory (~20 pathogens) panel by PCR     Status: None   Collection Time: 07/23/23  9:30 AM   Specimen: Nasopharyngeal Swab; Respiratory  Result Value Ref Range Status   Adenovirus NOT DETECTED NOT DETECTED Final   Coronavirus 229E NOT DETECTED NOT DETECTED Final    Comment: (NOTE) The Coronavirus on the Respiratory Panel, DOES NOT test for the novel  Coronavirus (2019 nCoV)    Coronavirus HKU1  NOT DETECTED NOT DETECTED Final   Coronavirus NL63 NOT DETECTED NOT DETECTED Final   Coronavirus OC43 NOT DETECTED NOT DETECTED Final   Metapneumovirus NOT DETECTED NOT DETECTED Final   Rhinovirus / Enterovirus NOT DETECTED NOT DETECTED Final   Influenza A NOT DETECTED NOT DETECTED Final   Influenza B NOT DETECTED NOT DETECTED Final   Parainfluenza Virus 1 NOT DETECTED NOT DETECTED Final   Parainfluenza Virus 2 NOT DETECTED NOT DETECTED Final   Parainfluenza Virus 3 NOT DETECTED NOT DETECTED Final   Parainfluenza Virus 4 NOT DETECTED NOT DETECTED Final   Respiratory Syncytial Virus NOT DETECTED NOT DETECTED Final   Bordetella pertussis NOT DETECTED NOT DETECTED Final   Bordetella Parapertussis NOT DETECTED NOT DETECTED Final   Chlamydophila pneumoniae NOT DETECTED NOT DETECTED Final   Mycoplasma pneumoniae NOT DETECTED NOT DETECTED Final    Comment:  Performed at [facility], [address] [place], [place] [address]         Radiology Studies: No results found.      Scheduled Meds:  amoxicillin-clavulanate  1 tablet Oral Q12H   arformoterol  15 mcg Nebulization BID   aspirin EC  81 mg Oral Daily   atorvastatin  80 mg Oral Daily   chlorpheniramine-HYDROcodone  5 mL Oral Q4H   dapagliflozin propanediol  10 mg Oral Daily   enoxaparin (LOVENOX) injection  70 mg Subcutaneous Q24H   guaiFENesin  1,200 mg Oral BID   insulin aspart  0-20 Units Subcutaneous TID WC   insulin aspart  0-5 Units Subcutaneous QHS   insulin aspart  10 Units Subcutaneous TID WC   insulin glargine-yfgn  28 Units Subcutaneous Daily   ipratropium-albuterol  3 mL Nebulization BID   lisinopril  20 mg Oral Daily   magnesium oxide  400 mg Oral BID   methylPREDNISolone (SOLU-MEDROL) injection  40 mg Intravenous Q12H   metoprolol succinate  25 mg Oral Daily   pantoprazole  40 mg Oral Daily   [START ON 07/28/2023] pneumococcal 20-valent conjugate vaccine  0.5 mL Intramuscular Tomorrow-1000   spironolactone  25 mg Oral Daily   torsemide  40 mg Oral Daily   Continuous Infusions:   LOS: 4 days     Tresa Moore, MD [facility]   If 7PM-7AM, please contact night-coverage  07/27/2023, 11:37 AM

## 2023-07-27 NOTE — Plan of Care (Signed)
  Problem: Education: Goal: Ability to describe self-care measures that may prevent or decrease complications (Diabetes Survival Skills Education) will improve Outcome: Progressing Goal: Individualized Educational Video(s) Outcome: Progressing   Problem: Coping: Goal: Ability to adjust to condition or change in health will improve Outcome: Progressing   Problem: Fluid Volume: Goal: Ability to maintain a balanced intake and output will improve Outcome: Progressing   Problem: Health Behavior/Discharge Planning: Goal: Ability to identify and utilize available resources and services will improve Outcome: Progressing Goal: Ability to manage health-related needs will improve Outcome: Progressing   Problem: Metabolic: Goal: Ability to maintain appropriate glucose levels will improve Outcome: Progressing   Problem: Nutritional: Goal: Maintenance of adequate nutrition will improve Outcome: Progressing Goal: Progress toward achieving an optimal weight will improve Outcome: Progressing   Problem: Skin Integrity: Goal: Risk for impaired skin integrity will decrease Outcome: Progressing   Problem: Tissue Perfusion: Goal: Adequacy of tissue perfusion will improve Outcome: Progressing   Problem: Education: Goal: Knowledge of General Education information will improve Description: Including pain rating scale, medication(s)/side effects and non-pharmacologic comfort measures Outcome: Progressing   Problem: Health Behavior/Discharge Planning: Goal: Ability to manage health-related needs will improve Outcome: Progressing   Problem: Clinical Measurements: Goal: Ability to maintain clinical measurements within normal limits will improve Outcome: Progressing Goal: Will remain free from infection Outcome: Progressing Goal: Diagnostic test results will improve Outcome: Progressing Goal: Respiratory complications will improve Outcome: Progressing Goal: Cardiovascular complication will  be avoided Outcome: Progressing   Problem: Activity: Goal: Risk for activity intolerance will decrease Outcome: Progressing   Problem: Nutrition: Goal: Adequate nutrition will be maintained Outcome: Progressing   Problem: Coping: Goal: Level of anxiety will decrease Outcome: Progressing   Problem: Elimination: Goal: Will not experience complications related to bowel motility Outcome: Progressing Goal: Will not experience complications related to urinary retention Outcome: Progressing   Problem: Pain Management: Goal: General experience of comfort will improve Outcome: Progressing   Problem: Safety: Goal: Ability to remain free from injury will improve Outcome: Progressing   Problem: Skin Integrity: Goal: Risk for impaired skin integrity will decrease Outcome: Progressing   Problem: Education: Goal: Ability to demonstrate management of disease process will improve Outcome: Progressing Goal: Ability to verbalize understanding of medication therapies will improve Outcome: Progressing Goal: Individualized Educational Video(s) Outcome: Progressing   Problem: Activity: Goal: Capacity to carry out activities will improve Outcome: Progressing   Problem: Cardiac: Goal: Ability to achieve and maintain adequate cardiopulmonary perfusion will improve Outcome: Progressing   Problem: Education: Goal: Knowledge of disease or condition will improve Outcome: Progressing Goal: Knowledge of the prescribed therapeutic regimen will improve Outcome: Progressing Goal: Individualized Educational Video(s) Outcome: Progressing   Problem: Activity: Goal: Ability to tolerate increased activity will improve Outcome: Progressing Goal: Will verbalize the importance of balancing activity with adequate rest periods Outcome: Progressing   Problem: Respiratory: Goal: Ability to maintain a clear airway will improve Outcome: Progressing Goal: Levels of oxygenation will improve Outcome:  Progressing Goal: Ability to maintain adequate ventilation will improve Outcome: Progressing

## 2023-07-28 ENCOUNTER — Telehealth (HOSPITAL_COMMUNITY): Payer: Self-pay | Admitting: Pharmacy Technician

## 2023-07-28 ENCOUNTER — Inpatient Hospital Stay: Payer: 59

## 2023-07-28 ENCOUNTER — Other Ambulatory Visit (HOSPITAL_COMMUNITY): Payer: Self-pay

## 2023-07-28 DIAGNOSIS — I5033 Acute on chronic diastolic (congestive) heart failure: Secondary | ICD-10-CM | POA: Diagnosis not present

## 2023-07-28 LAB — CULTURE, BLOOD (ROUTINE X 2)
Culture: NO GROWTH
Culture: NO GROWTH
Special Requests: ADEQUATE

## 2023-07-28 LAB — CBC WITH DIFFERENTIAL/PLATELET
Abs Immature Granulocytes: 0.08 10*3/uL — ABNORMAL HIGH (ref 0.00–0.07)
Basophils Absolute: 0 10*3/uL (ref 0.0–0.1)
Basophils Relative: 0 %
Eosinophils Absolute: 0 10*3/uL (ref 0.0–0.5)
Eosinophils Relative: 0 %
HCT: 47 % (ref 39.0–52.0)
Hemoglobin: 14.6 g/dL (ref 13.0–17.0)
Immature Granulocytes: 1 %
Lymphocytes Relative: 23 %
Lymphs Abs: 2.6 10*3/uL (ref 0.7–4.0)
MCH: 28.7 pg (ref 26.0–34.0)
MCHC: 31.1 g/dL (ref 30.0–36.0)
MCV: 92.5 fL (ref 80.0–100.0)
Monocytes Absolute: 0.9 10*3/uL (ref 0.1–1.0)
Monocytes Relative: 8 %
Neutro Abs: 7.7 10*3/uL (ref 1.7–7.7)
Neutrophils Relative %: 68 %
Platelets: 321 10*3/uL (ref 150–400)
RBC: 5.08 MIL/uL (ref 4.22–5.81)
RDW: 13.9 % (ref 11.5–15.5)
WBC: 11.3 10*3/uL — ABNORMAL HIGH (ref 4.0–10.5)
nRBC: 0 % (ref 0.0–0.2)

## 2023-07-28 LAB — BASIC METABOLIC PANEL
Anion gap: 9 (ref 5–15)
BUN: 48 mg/dL — ABNORMAL HIGH (ref 8–23)
CO2: 35 mmol/L — ABNORMAL HIGH (ref 22–32)
Calcium: 8.5 mg/dL — ABNORMAL LOW (ref 8.9–10.3)
Chloride: 90 mmol/L — ABNORMAL LOW (ref 98–111)
Creatinine, Ser: 1.56 mg/dL — ABNORMAL HIGH (ref 0.61–1.24)
GFR, Estimated: 49 mL/min — ABNORMAL LOW (ref >60)
Glucose, Bld: 276 mg/dL — ABNORMAL HIGH (ref 70–99)
Potassium: 3.9 mmol/L (ref 3.5–5.1)
Sodium: 134 mmol/L — ABNORMAL LOW (ref 135–145)

## 2023-07-28 LAB — GLUCOSE, CAPILLARY
Glucose-Capillary: 132 mg/dL — ABNORMAL HIGH (ref 70–99)
Glucose-Capillary: 178 mg/dL — ABNORMAL HIGH (ref 70–99)
Glucose-Capillary: 191 mg/dL — ABNORMAL HIGH (ref 70–99)
Glucose-Capillary: 255 mg/dL — ABNORMAL HIGH (ref 70–99)

## 2023-07-28 MED ORDER — ENOXAPARIN SODIUM 80 MG/0.8ML IJ SOSY
65.0000 mg | PREFILLED_SYRINGE | INTRAMUSCULAR | Status: DC
  Administered 2023-07-29 – 2023-08-03 (×6): 65 mg via SUBCUTANEOUS
  Filled 2023-07-28 (×6): qty 0.8

## 2023-07-28 MED ORDER — DICLOFENAC SODIUM 1 % EX GEL
4.0000 g | Freq: Four times a day (QID) | CUTANEOUS | Status: DC
  Administered 2023-07-28 – 2023-08-03 (×21): 4 g via TOPICAL
  Filled 2023-07-28 (×2): qty 100

## 2023-07-28 MED ORDER — OXYCODONE HCL 5 MG PO TABS
5.0000 mg | ORAL_TABLET | ORAL | Status: DC | PRN
  Administered 2023-07-28: 10 mg via ORAL
  Administered 2023-07-28: 5 mg via ORAL
  Administered 2023-07-29 – 2023-08-01 (×11): 10 mg via ORAL
  Filled 2023-07-28 (×8): qty 2
  Filled 2023-07-28: qty 1
  Filled 2023-07-28 (×4): qty 2

## 2023-07-28 NOTE — Progress Notes (Signed)
Heart Failure Stewardship Pharmacy Note  PCP: [facility] PCP-Cardiologist: None  HPI: Cody Alexander is a 67 y.o. male with  Diastolic CHF, COPD on 3-4L O2, CAD, HTN, T2DM, OSA on CPAP, morbid obesity who presented with congestion and chest pain with cough as well as progressive dyspnea despite increasing home O2. HS-troponin on admission was 89 and BNP was 208.8. CXR showed cardiomegaly with mild central congestion.  Pertinent cardiac history: LVEF in 12/2016 was 35% with grade II diastolic dysfunction. Admitted with NSTEMI in 2019. LHC in 02/2018 showed minor nonobstructive CAD. LVEF improved to >70% on echo in 09/2018. Echo in 12/2022 showed LVEF of 65-70% with grade II diastolic dysfunction. Chart review noted history of stenting, however unable to confirm in procedure notes.  Pertinent Lab Values: Creatinine  Date Value Ref Range Status  08/20/2013 1.22 0.60 - 1.30 mg/dL Final   Creatinine, Ser  Date Value Ref Range Status  07/25/2023 1.31 (H) 0.61 - 1.24 mg/dL Final   BUN  Date Value Ref Range Status  07/25/2023 37 (H) 8 - 23 mg/dL Final  16/05/9603 11 7 - 18 mg/dL Final   Potassium  Date Value Ref Range Status  07/25/2023 4.0 3.5 - 5.1 mmol/L Final  08/20/2013 3.7 3.5 - 5.1 mmol/L Final   Sodium  Date Value Ref Range Status  07/25/2023 136 135 - 145 mmol/L Final  08/20/2013 134 (L) 136 - 145 mmol/L Final   B Natriuretic Peptide  Date Value Ref Range Status  07/23/2023 208.8 (H) 0.0 - 100.0 pg/mL Final    Comment:    Performed at [facility], [address] [place], [place] [address]   Magnesium  Date Value Ref Range Status  06/13/2023 2.2 1.7 - 2.4 mg/dL Final    Comment:    Performed at [facility], [address] [place], [place] [address]   Hemoglobin A1C  Date Value Ref Range Status  08/12/2013 6.9 (H) 4.2 - 6.3 % Final    Comment:    The American Diabetes Association recommends that a primary goal of therapy  should be <7% and that physicians should reevaluate the treatment regimen in patients with HbA1c values consistently >8%.    Hgb A1c MFr Bld  Date Value Ref Range Status  07/23/2023 10.5 (H) 4.8 - 5.6 % Final    Comment:    (NOTE) Pre diabetes:          5.7%-6.4%  Diabetes:              >6.4%  Glycemic control for   <7.0% adults with diabetes    TSH  Date Value Ref Range Status  01/05/2023 0.389 0.350 - 4.500 uIU/mL Final    Comment:    Performed by a 3rd Generation assay with a functional sensitivity of <=0.01 uIU/mL. Performed at [facility], [address] [place], [place] [address]     Vital Signs: Admission weight: 308.86 lbs Temp:  [97.7 F (36.5 C)-98.1 F (36.7 C)] 97.8 F (36.6 C) (12/01 2308) Pulse Rate:  [75-87] 80 (12/01 2308) Resp:  [16-20] 18 (12/01 2308) BP: (115-129)/(77-91) 129/82 (12/01 2308) SpO2:  [93 %-96 %] 95 % (12/02 0733)  Intake/Output Summary (Last 24 hours) at 07/28/2023 0837 Last data filed at 07/28/2023 0400 Gross per 24 hour  Intake 720 ml  Output --  Net 720 ml   Current Heart Failure Medications:  Loop diuretic: torsemide 40 mg daily Beta-Blocker: metoprolol succinate 25 mg daily ACEI/ARB/ARNI: MRA: spironolactone 25  mg  SGLT2i: not ideal given A1c of 10.5 Other:  Prior to admission Heart Failure Medications:  Loop diuretic: torsemide 40 mg daily Beta-Blocker: metoprolol succinate 25 mg daily ACEI/ARB/ARNI: lisinopril 20 mg daily (not taking losartan) MRA: spironolactone 25 mg daily SGLT2i: Stopped Jardiance due to diarrhea Other: Ozempic  Assessment: 1. Acute on chronic combined systolic and diastolic heart failure (LVEF 35% in 2019, now recovered to 65-70%) with grade II diastolic dysfunction and severe LVH, due to NICM. NYHA class III symptoms.  -Symptoms: Reports shortness of breath is improving along with LEE, however neither are at baseline. Reports continued orthopnea. Reports major issue is congestion and  coughing. -Volume: Appears to still be nearly euvolemic with symptoms predominantly due to COPD, though substantially improved from admission. Weight is down 20 lbs. Urine output the last couple days is charted as low. Creatinine is trending up, agree with torsemide dose today.  -Hemodynamics: BP stable in 110-120s/70-80s. Heart rate is stable in 80s. -BB: Continue current dose of metoprolol succinate. Rates are controlled. -ACEI/ARB/ARNI: Continue home lisinopril 20 mg daily. Confirmed the patient had stopped losartan. Can transition to [facility] if affordable.  -MRA: Continue target dose of spironolactone 25 mg daily. -SGLT2i: Previously did not tolerate Jardiance due to nausea and diarrhea. Marcelline Deist ordered inpatient. A1c is elevated, will need to monitor closely to ensure that the patient is not at greater risk of UTI with heavy glucosuria.   Plan: 1) Medication changes recommended at this time: -None at this time.   2) Patient assistance: -Pending  3) Education: - Patient has been educated on current HF medications and potential additions to HF medication regimen - Patient verbalizes understanding that over the next few months, these medication doses may change and more medications may be added to optimize HF regimen - Patient has been educated on basic disease state pathophysiology and goals of therapy  Medication Assistance / Insurance Benefits Check: Does the patient have prescription insurance?    Type of insurance plan:  Does the patient qualify for medication assistance through manufacturers or grants? Pending   Outpatient Pharmacy: Prior to admission outpatient pharmacy: [facility]     Please do not hesitate to reach out with questions or concerns,  Enos Fling, PharmD, CPP, BCPS Heart Failure Pharmacist  Phone - [phone] 07/28/2023 12:12 PM

## 2023-07-28 NOTE — Progress Notes (Signed)
PROGRESS NOTE    Cody Alexander  JYN:[id number] DOB: Dec 01, 1955 DOA: 07/23/2023 PCP: [facility]    Brief Narrative:  67 year old male with known history of diastolic congestive heart failure and COPD who presents with progressive shortness of breath and mild bilateral lower extremity weakness.  Shortness of breath is slowly improving.  I suspect this is secondary to more decompensated COPD than CHF.  Mild pulmonary edema noted on chest imaging.  Trace lower extremity edema, BNP minimally elevated.   Will continue to treat for concomitant CHF and COPD.  Continue diuresis to maintain net euvolemia or mild net negative status.  IV steroids, bronchodilators.  Wean oxygen as tolerated.  Nocturnal CPAP.   Assessment & Plan:   Principal Problem:   CHF exacerbation (HCC) Active Problems:   Acute on chronic respiratory failure with hypoxia and hypercapnia (HCC)   Acute on chronic diastolic CHF (congestive heart failure) (HCC)   Hypertensive emergency   COPD with acute exacerbation (HCC)   Elevated troponin   CAD (coronary artery disease)   Uncontrolled type 2 diabetes mellitus with hyperglycemia, with long-term current use of insulin (HCC)   Obesity, Class III, BMI 40-49.9 (morbid obesity) (HCC)   OSA (obstructive sleep apnea)  Acute on chronic respiratory failure with hypoxia and hypercapnia (HCC) Likely secondary to both COPD and CHF exacerbation Weaned successfully off BiPAP Treatment pathways for COPD and CHF as below   Acute on chronic diastolic CHF (congestive heart failure) (HCC) Hypertensive emergency BP 161/110 on arrival Chest x-ray showing pulmonary vascular congestion Plan: Continue home diuretics Aldactone and torsemide Continue metoprolol and ACE inhibitor per home dose Continue with daily weights, strict I's and O's Maintain net negative to euvolemia  COPD with acute exacerbation (HCC) The primary driver for patient's symptoms.  RVP and  COVID-negative. Refractory to therapy thus far Plan: Continue Solu-Medrol 40 mg IV daily Aggressive bronchodilator therapy Mucolytic's Wean oxygen as tolerated Antitussives MetaNeb every 4 hours  Elevated troponin CAD with history of stent Troponin elevated to 88, likely demand ischemia Continue home Isordil, metoprolol, lisinopril and atorvastatin   Uncontrolled type 2 diabetes mellitus with hyperglycemia, with long-term current use of insulin (HCC) Basal bolus regimen Carb modified diet   OSA (obstructive sleep apnea) Currently on BiPAP   Obesity, Class III, BMI 40-49.9 (morbid obesity) (HCC) Complicating factor to overall prognosis and care     DVT prophylaxis: SQ Lovenox Code Status: Full Family Communication: None Disposition Plan: Status is: Inpatient Remains inpatient appropriate because: Concomitant CHF and COPD exacerbation   Level of care: Progressive  Consultants:  None  Procedures:  None  Antimicrobials: None   Subjective: Seen and examined.  Overall breathing improved.  Still with significant cough  Objective: Vitals:   07/27/23 2308 07/28/23 0733 07/28/23 0845 07/28/23 1207  BP: 129/82  117/77 99/68  Pulse: 80   81  Resp: 18     Temp: 97.8 F (36.6 C)   98.4 F (36.9 C)  TempSrc:      SpO2: 96% 95%  91%  Weight:      Height:        Intake/Output Summary (Last 24 hours) at 07/28/2023 1327 Last data filed at 07/28/2023 1100 Gross per 24 hour  Intake 720 ml  Output 500 ml  Net 220 ml   Filed Weights   07/23/23 0514 07/23/23 0600 07/27/23 0512  Weight: (!) 140.1 kg (!) 136.9 kg 130.7 kg    Examination:  General exam: No acute distress Respiratory system: Bibasilar  crackles.  No wheeze.  No work of breathing.  Significant coughing.  2 L Cardiovascular system: S1-S2, RRR, no murmurs, no pedal edema Gastrointestinal system:, Soft, NT/ND, normal bowel sounds Central nervous system: Alert and oriented. No focal neurological  deficits. Extremities: Symmetric 5 x 5 power. Skin: No rashes, lesions or ulcers Psychiatry: Judgement and insight appear normal. Mood & affect appropriate.     Data Reviewed: I have personally reviewed following labs and imaging studies  CBC: Recent Labs  Lab 07/23/23 0025 07/25/23 0851 07/28/23 0939  WBC 9.4 11.4* 11.3*  NEUTROABS 6.6 9.8* 7.7  HGB 12.0* 14.1 14.6  HCT 37.6* 43.8 47.0  MCV 90.0 89.8 92.5  PLT 257 348 321   Basic Metabolic Panel: Recent Labs  Lab 07/23/23 0025 07/25/23 0851 07/28/23 0939  NA 135 136 134*  K 4.3 4.0 3.9  CL 95* 89* 90*  CO2 32 35* 35*  GLUCOSE 271* 266* 276*  BUN 21 37* 48*  CREATININE 1.15 1.31* 1.56*  CALCIUM 8.8* 8.8* 8.5*   GFR: Estimated Creatinine Clearance: 65.1 mL/min (A) (by C-G formula based on SCr of 1.56 mg/dL (H)). Liver Function Tests: Recent Labs  Lab 07/23/23 0025  AST 21  ALT 21  ALKPHOS 55  BILITOT 0.7  PROT 6.5  ALBUMIN 3.4*   No results for input(s): "LIPASE", "AMYLASE" in the last 168 hours. No results for input(s): "AMMONIA" in the last 168 hours. Coagulation Profile: No results for input(s): "INR", "PROTIME" in the last 168 hours. Cardiac Enzymes: No results for input(s): "CKTOTAL", "CKMB", "CKMBINDEX", "TROPONINI" in the last 168 hours. BNP (last 3 results) No results for input(s): "PROBNP" in the last 8760 hours. HbA1C: No results for input(s): "HGBA1C" in the last 72 hours.  CBG: Recent Labs  Lab 07/27/23 1217 07/27/23 1630 07/27/23 2124 07/28/23 0816 07/28/23 1205  GLUCAP 263* 117* 140* 132* 178*   Lipid Profile: No results for input(s): "CHOL", "HDL", "LDLCALC", "TRIG", "CHOLHDL", "LDLDIRECT" in the last 72 hours. Thyroid Function Tests: No results for input(s): "TSH", "T4TOTAL", "FREET4", "T3FREE", "THYROIDAB" in the last 72 hours. Anemia Panel: No results for input(s): "VITAMINB12", "FOLATE", "FERRITIN", "TIBC", "IRON", "RETICCTPCT" in the last 72 hours. Sepsis Labs: Recent  Labs  Lab 07/23/23 0025 07/23/23 0625 07/23/23 0819  LATICACIDVEN 1.2 2.2* 1.2    Recent Results (from the past 240 hour(s))  Resp panel by RT-PCR (RSV, Flu A&B, Covid) Anterior Nasal Swab     Status: None   Collection Time: 07/23/23 12:25 AM   Specimen: Anterior Nasal Swab  Result Value Ref Range Status   SARS Coronavirus 2 by RT PCR NEGATIVE NEGATIVE Final    Comment: (NOTE) SARS-CoV-2 target nucleic acids are NOT DETECTED.  The SARS-CoV-2 RNA is generally detectable in upper respiratory specimens during the acute phase of infection. The lowest concentration of SARS-CoV-2 viral copies this assay can detect is 138 copies/mL. A negative result does not preclude SARS-Cov-2 infection and should not be used as the sole basis for treatment or other patient management decisions. A negative result may occur with  improper specimen collection/handling, submission of specimen other than nasopharyngeal swab, presence of viral mutation(s) within the areas targeted by this assay, and inadequate number of viral copies(<138 copies/mL). A negative result must be combined with clinical observations, patient history, and epidemiological information. The expected result is Negative.  Fact Sheet for Patients:  BloggerCourse.com  Fact Sheet for Healthcare Providers:  SeriousBroker.it  This test is no t yet approved or cleared by the [place]  FDA and  has been authorized for detection and/or diagnosis of SARS-CoV-2 by FDA under an Emergency Use Authorization (EUA). This EUA will remain  in effect (meaning this test can be used) for the duration of the COVID-19 declaration under Section 564(b)(1) of the Act, 21 U.S.C.section 360bbb-3(b)(1), unless the authorization is terminated  or revoked sooner.       Influenza A by PCR NEGATIVE NEGATIVE Final   Influenza B by PCR NEGATIVE NEGATIVE Final    Comment: (NOTE) The Xpert Xpress  SARS-CoV-2/FLU/RSV plus assay is intended as an aid in the diagnosis of influenza from Nasopharyngeal swab specimens and should not be used as a sole basis for treatment. Nasal washings and aspirates are unacceptable for Xpert Xpress SARS-CoV-2/FLU/RSV testing.  Fact Sheet for Patients: BloggerCourse.com  Fact Sheet for Healthcare Providers: SeriousBroker.it  This test is not yet approved or cleared by the [place] FDA and has been authorized for detection and/or diagnosis of SARS-CoV-2 by FDA under an Emergency Use Authorization (EUA). This EUA will remain in effect (meaning this test can be used) for the duration of the COVID-19 declaration under Section 564(b)(1) of the Act, 21 U.S.C. section 360bbb-3(b)(1), unless the authorization is terminated or revoked.     Resp Syncytial Virus by PCR NEGATIVE NEGATIVE Final    Comment: (NOTE) Fact Sheet for Patients: BloggerCourse.com  Fact Sheet for Healthcare Providers: SeriousBroker.it  This test is not yet approved or cleared by the [place] FDA and has been authorized for detection and/or diagnosis of SARS-CoV-2 by FDA under an Emergency Use Authorization (EUA). This EUA will remain in effect (meaning this test can be used) for the duration of the COVID-19 declaration under Section 564(b)(1) of the Act, 21 U.S.C. section 360bbb-3(b)(1), unless the authorization is terminated or revoked.  Performed at [facility], [address] [place], [place] [address]   Blood culture (routine x 2)     Status: None   Collection Time: 07/23/23 12:25 AM   Specimen: BLOOD  Result Value Ref Range Status   Specimen Description BLOOD BLOOD LEFT ARM  Final   Special Requests   Final    BOTTLES DRAWN AEROBIC AND ANAEROBIC Blood Culture results may not be optimal due to an excessive volume of blood received in culture bottles    Culture   Final    NO GROWTH 5 DAYS Performed at [facility], [address] [place], [place] [address]    Report Status 07/28/2023 FINAL  Final  Culture, blood (Routine X 2) w Reflex to ID Panel     Status: None   Collection Time: 07/23/23  8:19 AM   Specimen: BLOOD RIGHT ARM  Result Value Ref Range Status   Specimen Description BLOOD RIGHT ARM  Final   Special Requests   Final    BOTTLES DRAWN AEROBIC AND ANAEROBIC Blood Culture adequate volume   Culture   Final    NO GROWTH 5 DAYS Performed at [facility], [address] [place], [place] [address]    Report Status 07/28/2023 FINAL  Final  Respiratory (~20 pathogens) panel by PCR     Status: None   Collection Time: 07/23/23  9:30 AM   Specimen: Nasopharyngeal Swab; Respiratory  Result Value Ref Range Status   Adenovirus NOT DETECTED NOT DETECTED Final   Coronavirus 229E NOT DETECTED NOT DETECTED Final    Comment: (NOTE) The Coronavirus on the Respiratory Panel, DOES NOT test for the novel  Coronavirus (2019 nCoV)  Coronavirus HKU1 NOT DETECTED NOT DETECTED Final   Coronavirus NL63 NOT DETECTED NOT DETECTED Final   Coronavirus OC43 NOT DETECTED NOT DETECTED Final   Metapneumovirus NOT DETECTED NOT DETECTED Final   Rhinovirus / Enterovirus NOT DETECTED NOT DETECTED Final   Influenza A NOT DETECTED NOT DETECTED Final   Influenza B NOT DETECTED NOT DETECTED Final   Parainfluenza Virus 1 NOT DETECTED NOT DETECTED Final   Parainfluenza Virus 2 NOT DETECTED NOT DETECTED Final   Parainfluenza Virus 3 NOT DETECTED NOT DETECTED Final   Parainfluenza Virus 4 NOT DETECTED NOT DETECTED Final   Respiratory Syncytial Virus NOT DETECTED NOT DETECTED Final   Bordetella pertussis NOT DETECTED NOT DETECTED Final   Bordetella Parapertussis NOT DETECTED NOT DETECTED Final   Chlamydophila pneumoniae NOT DETECTED NOT DETECTED Final   Mycoplasma pneumoniae NOT DETECTED NOT DETECTED Final    Comment: Performed at [facility], [address], [place], [place] [address]         Radiology Studies: DG Foot Complete Right  Result Date: 07/28/2023 CLINICAL DATA:  Foot caught in bed railing. Erythema and pain, reduced range of motion. EXAM: RIGHT FOOT COMPLETE - 3+ VIEW COMPARISON:  None Available. FINDINGS: Degenerative subcortical cysts or erosions medially in the first metatarsal head. Gout arthropathy not excluded. Small accessory navicular and os peroneus. Subtle nondisplaced acute fracture of the proximal metaphysis of the proximal phalanx small toe. IMPRESSION: 1. Subtle nondisplaced acute transverse fracture of the proximal metaphysis of the proximal phalanx small toe. 2. Degenerative subcortical cysts or erosions medially in the first metatarsal head. Gout arthropathy not excluded. Electronically Signed   By: Gaylyn Rong M.D.   On: 07/28/2023 11:57        Scheduled Meds:  arformoterol  15 mcg Nebulization BID   aspirin EC  81 mg Oral Daily   atorvastatin  80 mg Oral Daily   chlorpheniramine-HYDROcodone  5 mL Oral Q4H   dapagliflozin propanediol  10 mg Oral Daily   diclofenac Sodium  4 g Topical QID   enoxaparin (LOVENOX) injection  70 mg Subcutaneous Q24H   guaiFENesin  1,200 mg Oral BID   insulin aspart  0-20 Units Subcutaneous TID WC   insulin aspart  0-5 Units Subcutaneous QHS   insulin aspart  10 Units Subcutaneous TID WC   insulin glargine-yfgn  28 Units Subcutaneous Daily   ipratropium-albuterol  3 mL Nebulization BID   lisinopril  20 mg Oral Daily   magnesium oxide  400 mg Oral BID   methylPREDNISolone (SOLU-MEDROL) injection  40 mg Intravenous Daily   metoprolol succinate  25 mg Oral Daily   pantoprazole  40 mg Oral Daily   spironolactone  25 mg Oral Daily   torsemide  40 mg Oral Daily   Continuous Infusions:   LOS: 5 days     Tresa Moore, MD [facility]   If 7PM-7AM, please contact night-coverage  07/28/2023, 1:27 PM

## 2023-07-28 NOTE — Progress Notes (Signed)
Physical Therapy Treatment Patient Details Name: Cody Alexander MRN: [id number] DOB: November 23, 1955 Today's Date: 07/28/2023   History of Present Illness Pt is a 67 year old male presenting with SOB, CHF.  Was here ~5 weeks ago with similar  with chest pain and apparent heart attack.   PMH significant for CHF, COPD, CAD, HTN, T2DM, OSA    PT Comments  Patient is agreeable to PT session but declined walking due to right toe pain. He reports walking laps last night. Patient education on energy conservation strategies and using pursed lip breathing techniques. LE exercises performed in sitting for strengthening. Recommend to continue PT to maximize independence and decrease caregiver burden.    If plan is discharge home, recommend the following: Assist for transportation;Help with stairs or ramp for entrance   Can travel by private vehicle        Equipment Recommendations  None recommended by PT    Recommendations for Other Services       Precautions / Restrictions Precautions Precautions: Fall Restrictions Weight Bearing Restrictions: No     Mobility  Bed Mobility Overal bed mobility: Independent             General bed mobility comments: sitting to supine with HOB elevated    Transfers                   General transfer comment: patient declined due to toe pain    Ambulation/Gait                   Stairs             Wheelchair Mobility     Tilt Bed    Modified Rankin (Stroke Patients Only)       Balance Overall balance assessment: Independent                                          Cognition Arousal: Alert Behavior During Therapy: WFL for tasks assessed/performed Overall Cognitive Status: Within Functional Limits for tasks assessed                                          Exercises General Exercises - Lower Extremity Ankle Circles/Pumps: AROM, Strengthening, Both, 5 reps, Seated Long Arc  Quad: AROM, Strengthening, Left, Seated (3 reps) Heel Slides: AROM, Strengthening, Both, 5 reps, Seated Other Exercises Other Exercises: verbal and visual cues for exercise technique for strengthening. patient complains intermittently of right toe pain with exercise    General Comments General comments (skin integrity, edema, etc.): energy conservation strategies encouraged along with purse lip breathing techniques which patient demonstrated correctly. significant coughing with talking.      Pertinent Vitals/Pain Pain Assessment Pain Assessment: No/denies pain    Home Living                          Prior Function            PT Goals (current goals can now be found in the care plan section) Acute Rehab PT Goals Patient Stated Goal: to go home when breathing is better PT Goal Formulation: With patient Time For Goal Achievement: 08/07/23 Potential to Achieve Goals: Good Progress towards PT goals: Progressing toward goals    Frequency  Min 1X/week      PT Plan      Co-evaluation              AM-PAC PT "6 Clicks" Mobility   Outcome Measure  Help needed turning from your back to your side while in a flat bed without using bedrails?: None Help needed moving from lying on your back to sitting on the side of a flat bed without using bedrails?: None Help needed moving to and from a bed to a chair (including a wheelchair)?: None Help needed standing up from a chair using your arms (e.g., wheelchair or bedside chair)?: None Help needed to walk in hospital room?: None Help needed climbing 3-5 steps with a railing? : A Little 6 Click Score: 23    End of Session Equipment Utilized During Treatment: Oxygen Activity Tolerance: Patient limited by fatigue Patient left: in bed;with call bell/phone within reach   PT Visit Diagnosis: Difficulty in walking, not elsewhere classified (R26.2)     Time: 1202-1220 PT Time Calculation (min) (ACUTE ONLY): 18  min  Charges:    $Therapeutic Exercise: 8-22 mins PT General Charges $$ ACUTE PT VISIT: 1 Visit                     Donna Bernard, PT, MPT    Ina Homes 07/28/2023, 1:38 PM

## 2023-07-28 NOTE — Plan of Care (Signed)
  Problem: Education: Goal: Ability to describe self-care measures that may prevent or decrease complications (Diabetes Survival Skills Education) will improve Outcome: Progressing Goal: Individualized Educational Video(s) Outcome: Progressing   Problem: Coping: Goal: Ability to adjust to condition or change in health will improve Outcome: Progressing   Problem: Fluid Volume: Goal: Ability to maintain a balanced intake and output will improve Outcome: Progressing   Problem: Health Behavior/Discharge Planning: Goal: Ability to identify and utilize available resources and services will improve Outcome: Progressing Goal: Ability to manage health-related needs will improve Outcome: Progressing   Problem: Metabolic: Goal: Ability to maintain appropriate glucose levels will improve Outcome: Progressing   Problem: Nutritional: Goal: Maintenance of adequate nutrition will improve Outcome: Progressing Goal: Progress toward achieving an optimal weight will improve Outcome: Progressing   Problem: Skin Integrity: Goal: Risk for impaired skin integrity will decrease Outcome: Progressing   Problem: Tissue Perfusion: Goal: Adequacy of tissue perfusion will improve Outcome: Progressing   Problem: Education: Goal: Knowledge of General Education information will improve Description: Including pain rating scale, medication(s)/side effects and non-pharmacologic comfort measures Outcome: Progressing   Problem: Health Behavior/Discharge Planning: Goal: Ability to manage health-related needs will improve Outcome: Progressing   Problem: Clinical Measurements: Goal: Ability to maintain clinical measurements within normal limits will improve Outcome: Progressing Goal: Will remain free from infection Outcome: Progressing Goal: Diagnostic test results will improve Outcome: Progressing Goal: Respiratory complications will improve Outcome: Progressing Goal: Cardiovascular complication will  be avoided Outcome: Progressing   Problem: Activity: Goal: Risk for activity intolerance will decrease Outcome: Progressing   Problem: Nutrition: Goal: Adequate nutrition will be maintained Outcome: Progressing   Problem: Coping: Goal: Level of anxiety will decrease Outcome: Progressing   Problem: Elimination: Goal: Will not experience complications related to bowel motility Outcome: Progressing Goal: Will not experience complications related to urinary retention Outcome: Progressing   Problem: Pain Management: Goal: General experience of comfort will improve Outcome: Progressing   Problem: Safety: Goal: Ability to remain free from injury will improve Outcome: Progressing   Problem: Skin Integrity: Goal: Risk for impaired skin integrity will decrease Outcome: Progressing   Problem: Education: Goal: Ability to demonstrate management of disease process will improve Outcome: Progressing Goal: Ability to verbalize understanding of medication therapies will improve Outcome: Progressing Goal: Individualized Educational Video(s) Outcome: Progressing   Problem: Activity: Goal: Capacity to carry out activities will improve Outcome: Progressing   Problem: Cardiac: Goal: Ability to achieve and maintain adequate cardiopulmonary perfusion will improve Outcome: Progressing   Problem: Education: Goal: Knowledge of disease or condition will improve Outcome: Progressing Goal: Knowledge of the prescribed therapeutic regimen will improve Outcome: Progressing Goal: Individualized Educational Video(s) Outcome: Progressing   Problem: Activity: Goal: Ability to tolerate increased activity will improve Outcome: Progressing Goal: Will verbalize the importance of balancing activity with adequate rest periods Outcome: Progressing   Problem: Respiratory: Goal: Ability to maintain a clear airway will improve Outcome: Progressing Goal: Levels of oxygenation will improve Outcome:  Progressing Goal: Ability to maintain adequate ventilation will improve Outcome: Progressing

## 2023-07-28 NOTE — Telephone Encounter (Signed)
Patient Product/process development scientist completed.    The patient is insured through [facility]. Patient has Medicare and is not eligible for a copay card, but may be able to apply for patient assistance, if available.    Ran test claim for Entresto 24-26 mg and the current 30 day co-pay is $0.00.  Ran test claim for Farxiga 10 mg and the current 30 day co-pay is $0.00.   This test claim was processed through [facility]- copay amounts may vary at other pharmacies due to pharmacy/plan contracts, or as the patient moves through the different stages of their insurance plan.     Roland Earl, CPHT Pharmacy Technician III Certified Patient Advocate [facility] Patient Advocate Team Direct Number: [phone]  Fax: [phone]

## 2023-07-29 DIAGNOSIS — I5033 Acute on chronic diastolic (congestive) heart failure: Secondary | ICD-10-CM | POA: Diagnosis not present

## 2023-07-29 LAB — GLUCOSE, CAPILLARY
Glucose-Capillary: 174 mg/dL — ABNORMAL HIGH (ref 70–99)
Glucose-Capillary: 128 mg/dL — ABNORMAL HIGH (ref 70–99)
Glucose-Capillary: 188 mg/dL — ABNORMAL HIGH (ref 70–99)
Glucose-Capillary: 287 mg/dL — ABNORMAL HIGH (ref 70–99)

## 2023-07-29 MED ORDER — ORAL CARE MOUTH RINSE: 15.0000 mL | OROMUCOSAL | Status: DC | PRN

## 2023-07-29 MED ORDER — HYDROCORTISONE 1 % EX CREA
TOPICAL_CREAM | Freq: Four times a day (QID) | CUTANEOUS | Status: DC
  Filled 2023-07-29 (×3): qty 28

## 2023-07-29 MED ORDER — PREDNISONE 20 MG PO TABS
40.0000 mg | ORAL_TABLET | Freq: Every day | ORAL | Status: DC
  Administered 2023-07-30 – 2023-08-03 (×5): 40 mg via ORAL
  Filled 2023-07-29 (×5): qty 2

## 2023-07-29 NOTE — Progress Notes (Signed)
PROGRESS NOTE    Cody Alexander  ION:[id number] DOB: 1956-06-28 DOA: 07/23/2023 PCP: [facility]    Brief Narrative:  68 year old male with known history of diastolic congestive heart failure and COPD who presents with progressive shortness of breath and mild bilateral lower extremity weakness.  Shortness of breath is slowly improving.  I suspect this is secondary to more decompensated COPD than CHF.  Mild pulmonary edema noted on chest imaging.  Trace lower extremity edema, BNP minimally elevated.   Will continue to treat for concomitant CHF and COPD.  Continue diuresis to maintain net euvolemia or mild net negative status.  IV steroids, bronchodilators.  Wean oxygen as tolerated.  Nocturnal CPAP.   Assessment & Plan:   Principal Problem:   CHF exacerbation (HCC) Active Problems:   Acute on chronic respiratory failure with hypoxia and hypercapnia (HCC)   Acute on chronic diastolic CHF (congestive heart failure) (HCC)   Hypertensive emergency   COPD with acute exacerbation (HCC)   Elevated troponin   CAD (coronary artery disease)   Uncontrolled type 2 diabetes mellitus with hyperglycemia, with long-term current use of insulin (HCC)   Obesity, Class III, BMI 40-49.9 (morbid obesity) (HCC)   OSA (obstructive sleep apnea)  Acute on chronic respiratory failure with hypoxia and hypercapnia (HCC) Likely secondary to both COPD and CHF exacerbation Weaned successfully off BiPAP Treatment pathways for COPD and CHF as below   Acute on chronic diastolic CHF (congestive heart failure) (HCC) Hypertensive emergency BP 161/110 on arrival Chest x-ray showing pulmonary vascular congestion Plan: Continue home diuretics Aldactone and torsemide Continue metoprolol and ACE inhibitor per home dose Continue with daily weights, strict I's and O's Maintain volume status net 0 to negative  COPD with acute exacerbation (HCC) The primary driver for patient's symptoms.  RVP and  COVID-negative. Refractory to therapy thus far Plan: Decrease Solu-Medrol from 20 mg once daily Continue aggressive bronchodilator therapy Mucolytic's Wean oxygen as tolerated Antitussives, consider weaning Tussionex 12/4 Continue MetaNeb every 4 hours for alveolar recruitment  Elevated troponin CAD with history of stent Troponin elevated to 88, likely demand ischemia Continue home Isordil, metoprolol, lisinopril and atorvastatin   Uncontrolled type 2 diabetes mellitus with hyperglycemia, with long-term current use of insulin (HCC) Basal bolus regimen Carb modified diet   OSA (obstructive sleep apnea) Currently on BiPAP   Obesity, Class III, BMI 40-49.9 (morbid obesity) (HCC) Complicating factor to overall prognosis and care     DVT prophylaxis: SQ Lovenox Code Status: Full Family Communication: None Disposition Plan: Status is: Inpatient Remains inpatient appropriate because: Concomitant CHF and COPD exacerbation   Level of care: Progressive  Consultants:  None  Procedures:  None  Antimicrobials: None   Subjective: Seen and examined.  Breathing slowly improving.  Still significant cough.  Objective: Vitals:   07/29/23 0805 07/29/23 1154 07/29/23 1210 07/29/23 1211  BP: (!) 147/96 (!) 159/125 93/67 97/70  Pulse: 73 (!) 124  71  Resp: 16 16    Temp: 98.5 F (36.9 C) 97.8 F (36.6 C)    TempSrc: Oral Oral    SpO2: 98% 95%  95%  Weight:      Height:        Intake/Output Summary (Last 24 hours) at 07/29/2023 1407 Last data filed at 07/29/2023 1100 Gross per 24 hour  Intake 240 ml  Output 2600 ml  Net -2360 ml   Filed Weights   07/23/23 0514 07/23/23 0600 07/27/23 0512  Weight: (!) 140.1 kg (!) 136.9 kg 130.7  kg    Examination:  General exam: NAD Respiratory system: Coarse breath sounds bilaterally.  No wheeze.  Normal work of breathing.  Coughing.  2 L Cardiovascular system: S1-S2, RRR, no murmurs, no pedal edema Gastrointestinal system:,  Obese, soft, NT/ND, normal bowel sounds Central nervous system: Alert and oriented. No focal neurological deficits. Extremities: Symmetric 5 x 5 power. Skin: No rashes, lesions or ulcers Psychiatry: Judgement and insight appear normal. Mood & affect appropriate.     Data Reviewed: I have personally reviewed following labs and imaging studies  CBC: Recent Labs  Lab 07/23/23 0025 07/25/23 0851 07/28/23 0939  WBC 9.4 11.4* 11.3*  NEUTROABS 6.6 9.8* 7.7  HGB 12.0* 14.1 14.6  HCT 37.6* 43.8 47.0  MCV 90.0 89.8 92.5  PLT 257 348 321   Basic Metabolic Panel: Recent Labs  Lab 07/23/23 0025 07/25/23 0851 07/28/23 0939  NA 135 136 134*  K 4.3 4.0 3.9  CL 95* 89* 90*  CO2 32 35* 35*  GLUCOSE 271* 266* 276*  BUN 21 37* 48*  CREATININE 1.15 1.31* 1.56*  CALCIUM 8.8* 8.8* 8.5*   GFR: Estimated Creatinine Clearance: 65.1 mL/min (A) (by C-G formula based on SCr of 1.56 mg/dL (H)). Liver Function Tests: Recent Labs  Lab 07/23/23 0025  AST 21  ALT 21  ALKPHOS 55  BILITOT 0.7  PROT 6.5  ALBUMIN 3.4*   No results for input(s): "LIPASE", "AMYLASE" in the last 168 hours. No results for input(s): "AMMONIA" in the last 168 hours. Coagulation Profile: No results for input(s): "INR", "PROTIME" in the last 168 hours. Cardiac Enzymes: No results for input(s): "CKTOTAL", "CKMB", "CKMBINDEX", "TROPONINI" in the last 168 hours. BNP (last 3 results) No results for input(s): "PROBNP" in the last 8760 hours. HbA1C: No results for input(s): "HGBA1C" in the last 72 hours.  CBG: Recent Labs  Lab 07/28/23 1205 07/28/23 1605 07/28/23 2047 07/29/23 0803 07/29/23 1148  GLUCAP 178* 191* 255* 174* 128*   Lipid Profile: No results for input(s): "CHOL", "HDL", "LDLCALC", "TRIG", "CHOLHDL", "LDLDIRECT" in the last 72 hours. Thyroid Function Tests: No results for input(s): "TSH", "T4TOTAL", "FREET4", "T3FREE", "THYROIDAB" in the last 72 hours. Anemia Panel: No results for input(s):  "VITAMINB12", "FOLATE", "FERRITIN", "TIBC", "IRON", "RETICCTPCT" in the last 72 hours. Sepsis Labs: Recent Labs  Lab 07/23/23 0025 07/23/23 0625 07/23/23 0819  LATICACIDVEN 1.2 2.2* 1.2    Recent Results (from the past 240 hour(s))  Resp panel by RT-PCR (RSV, Flu A&B, Covid) Anterior Nasal Swab     Status: None   Collection Time: 07/23/23 12:25 AM   Specimen: Anterior Nasal Swab  Result Value Ref Range Status   SARS Coronavirus 2 by RT PCR NEGATIVE NEGATIVE Final    Comment: (NOTE) SARS-CoV-2 target nucleic acids are NOT DETECTED.  The SARS-CoV-2 RNA is generally detectable in upper respiratory specimens during the acute phase of infection. The lowest concentration of SARS-CoV-2 viral copies this assay can detect is 138 copies/mL. A negative result does not preclude SARS-Cov-2 infection and should not be used as the sole basis for treatment or other patient management decisions. A negative result may occur with  improper specimen collection/handling, submission of specimen other than nasopharyngeal swab, presence of viral mutation(s) within the areas targeted by this assay, and inadequate number of viral copies(<138 copies/mL). A negative result must be combined with clinical observations, patient history, and epidemiological information. The expected result is Negative.  Fact Sheet for Patients:  BloggerCourse.com  Fact Sheet for Healthcare Providers:  SeriousBroker.it  This test is no t yet approved or cleared by the [place] and  has been authorized for detection and/or diagnosis of SARS-CoV-2 by FDA under an Emergency Use Authorization (EUA). This EUA will remain  in effect (meaning this test can be used) for the duration of the COVID-19 declaration under Section 564(b)(1) of the Act, 21 U.S.C.section 360bbb-3(b)(1), unless the authorization is terminated  or revoked sooner.       Influenza A by PCR NEGATIVE  NEGATIVE Final   Influenza B by PCR NEGATIVE NEGATIVE Final    Comment: (NOTE) The Xpert Xpress SARS-CoV-2/FLU/RSV plus assay is intended as an aid in the diagnosis of influenza from Nasopharyngeal swab specimens and should not be used as a sole basis for treatment. Nasal washings and aspirates are unacceptable for Xpert Xpress SARS-CoV-2/FLU/RSV testing.  Fact Sheet for Patients: BloggerCourse.com  Fact Sheet for Healthcare Providers: SeriousBroker.it  This test is not yet approved or cleared by the [place] FDA and has been authorized for detection and/or diagnosis of SARS-CoV-2 by FDA under an Emergency Use Authorization (EUA). This EUA will remain in effect (meaning this test can be used) for the duration of the COVID-19 declaration under Section 564(b)(1) of the Act, 21 U.S.C. section 360bbb-3(b)(1), unless the authorization is terminated or revoked.     Resp Syncytial Virus by PCR NEGATIVE NEGATIVE Final    Comment: (NOTE) Fact Sheet for Patients: BloggerCourse.com  Fact Sheet for Healthcare Providers: SeriousBroker.it  This test is not yet approved or cleared by the [place] FDA and has been authorized for detection and/or diagnosis of SARS-CoV-2 by FDA under an Emergency Use Authorization (EUA). This EUA will remain in effect (meaning this test can be used) for the duration of the COVID-19 declaration under Section 564(b)(1) of the Act, 21 U.S.C. section 360bbb-3(b)(1), unless the authorization is terminated or revoked.  Performed at [facility], [address] [place], [place] [address]   Blood culture (routine x 2)     Status: None   Collection Time: 07/23/23 12:25 AM   Specimen: BLOOD  Result Value Ref Range Status   Specimen Description BLOOD BLOOD LEFT ARM  Final   Special Requests   Final    BOTTLES DRAWN AEROBIC AND ANAEROBIC Blood  Culture results may not be optimal due to an excessive volume of blood received in culture bottles   Culture   Final    NO GROWTH 5 DAYS Performed at [facility], [address], [place], [place] [address]    Report Status 07/28/2023 FINAL  Final  Culture, blood (Routine X 2) w Reflex to ID Panel     Status: None   Collection Time: 07/23/23  8:19 AM   Specimen: BLOOD RIGHT ARM  Result Value Ref Range Status   Specimen Description BLOOD RIGHT ARM  Final   Special Requests   Final    BOTTLES DRAWN AEROBIC AND ANAEROBIC Blood Culture adequate volume   Culture   Final    NO GROWTH 5 DAYS Performed at [facility], [address] [place], [place] [address]    Report Status 07/28/2023 FINAL  Final  Respiratory (~20 pathogens) panel by PCR     Status: None   Collection Time: 07/23/23  9:30 AM   Specimen: Nasopharyngeal Swab; Respiratory  Result Value Ref Range Status   Adenovirus NOT DETECTED NOT DETECTED Final   Coronavirus 229E NOT DETECTED NOT DETECTED Final    Comment: (NOTE) The Coronavirus on the Respiratory Panel,  DOES NOT test for the novel  Coronavirus (2019 nCoV)    Coronavirus HKU1 NOT DETECTED NOT DETECTED Final   Coronavirus NL63 NOT DETECTED NOT DETECTED Final   Coronavirus OC43 NOT DETECTED NOT DETECTED Final   Metapneumovirus NOT DETECTED NOT DETECTED Final   Rhinovirus / Enterovirus NOT DETECTED NOT DETECTED Final   Influenza A NOT DETECTED NOT DETECTED Final   Influenza B NOT DETECTED NOT DETECTED Final   Parainfluenza Virus 1 NOT DETECTED NOT DETECTED Final   Parainfluenza Virus 2 NOT DETECTED NOT DETECTED Final   Parainfluenza Virus 3 NOT DETECTED NOT DETECTED Final   Parainfluenza Virus 4 NOT DETECTED NOT DETECTED Final   Respiratory Syncytial Virus NOT DETECTED NOT DETECTED Final   Bordetella pertussis NOT DETECTED NOT DETECTED Final   Bordetella Parapertussis NOT DETECTED NOT DETECTED Final   Chlamydophila pneumoniae NOT DETECTED NOT  DETECTED Final   Mycoplasma pneumoniae NOT DETECTED NOT DETECTED Final    Comment: Performed at [facility], [address] [place], [place] [address]         Radiology Studies: DG Foot Complete Right  Result Date: 07/28/2023 CLINICAL DATA:  Foot caught in bed railing. Erythema and pain, reduced range of motion. EXAM: RIGHT FOOT COMPLETE - 3+ VIEW COMPARISON:  None Available. FINDINGS: Degenerative subcortical cysts or erosions medially in the first metatarsal head. Gout arthropathy not excluded. Small accessory navicular and os peroneus. Subtle nondisplaced acute fracture of the proximal metaphysis of the proximal phalanx small toe. IMPRESSION: 1. Subtle nondisplaced acute transverse fracture of the proximal metaphysis of the proximal phalanx small toe. 2. Degenerative subcortical cysts or erosions medially in the first metatarsal head. Gout arthropathy not excluded. Electronically Signed   By: Gaylyn Rong M.D.   On: 07/28/2023 11:57        Scheduled Meds:  arformoterol  15 mcg Nebulization BID   aspirin EC  81 mg Oral Daily   atorvastatin  80 mg Oral Daily   chlorpheniramine-HYDROcodone  5 mL Oral Q4H   dapagliflozin propanediol  10 mg Oral Daily   diclofenac Sodium  4 g Topical QID   enoxaparin (LOVENOX) injection  65 mg Subcutaneous Q24H   guaiFENesin  1,200 mg Oral BID   hydrocortisone cream   Topical QID   insulin aspart  0-20 Units Subcutaneous TID WC   insulin aspart  0-5 Units Subcutaneous QHS   insulin aspart  10 Units Subcutaneous TID WC   insulin glargine-yfgn  28 Units Subcutaneous Daily   ipratropium-albuterol  3 mL Nebulization BID   lisinopril  20 mg Oral Daily   magnesium oxide  400 mg Oral BID   metoprolol succinate  25 mg Oral Daily   pantoprazole  40 mg Oral Daily   [START ON 07/30/2023] predniSONE  40 mg Oral Q breakfast   spironolactone  25 mg Oral Daily   torsemide  40 mg Oral Daily   Continuous Infusions:   LOS: 6 days     Tresa Moore, MD [facility]   If 7PM-7AM, please contact night-coverage  07/29/2023, 2:07 PM

## 2023-07-29 NOTE — Progress Notes (Signed)
Heart Failure Stewardship Pharmacy Note  PCP: [facility] PCP-Cardiologist: None  HPI: Cody Alexander is a 67 y.o. male with  Diastolic CHF, COPD on 3-4L O2, CAD, HTN, T2DM, OSA on CPAP, morbid obesity who presented with congestion and chest pain with cough as well as progressive dyspnea despite increasing home O2. HS-troponin on admission was 89 and BNP was 208.8. CXR showed cardiomegaly with mild central congestion.  Pertinent cardiac history: LVEF in 12/2016 was 35% with grade II diastolic dysfunction. Admitted with NSTEMI in 2019. LHC in 02/2018 showed minor nonobstructive CAD. LVEF improved to >70% on echo in 09/2018. Echo in 12/2022 showed LVEF of 65-70% with grade II diastolic dysfunction. Chart review noted history of stenting, however unable to confirm in procedure notes.  Pertinent Lab Values: Creatinine  Date Value Ref Range Status  08/20/2013 1.22 0.60 - 1.30 mg/dL Final   Creatinine, Ser  Date Value Ref Range Status  07/28/2023 1.56 (H) 0.61 - 1.24 mg/dL Final   BUN  Date Value Ref Range Status  07/28/2023 48 (H) 8 - 23 mg/dL Final  21/30/8657 11 7 - 18 mg/dL Final   Potassium  Date Value Ref Range Status  07/28/2023 3.9 3.5 - 5.1 mmol/L Final  08/20/2013 3.7 3.5 - 5.1 mmol/L Final   Sodium  Date Value Ref Range Status  07/28/2023 134 (L) 135 - 145 mmol/L Final  08/20/2013 134 (L) 136 - 145 mmol/L Final   B Natriuretic Peptide  Date Value Ref Range Status  07/23/2023 208.8 (H) 0.0 - 100.0 pg/mL Final    Comment:    Performed at [facility], [address] [place], [place] [address]   Magnesium  Date Value Ref Range Status  06/13/2023 2.2 1.7 - 2.4 mg/dL Final    Comment:    Performed at [facility], [address] [place], [place] [address]   Hemoglobin A1C  Date Value Ref Range Status  08/12/2013 6.9 (H) 4.2 - 6.3 % Final    Comment:    The American Diabetes Association recommends that a primary goal  of therapy should be <7% and that physicians should reevaluate the treatment regimen in patients with HbA1c values consistently >8%.    Hgb A1c MFr Bld  Date Value Ref Range Status  07/23/2023 10.5 (H) 4.8 - 5.6 % Final    Comment:    (NOTE) Pre diabetes:          5.7%-6.4%  Diabetes:              >6.4%  Glycemic control for   <7.0% adults with diabetes    TSH  Date Value Ref Range Status  01/05/2023 0.389 0.350 - 4.500 uIU/mL Final    Comment:    Performed by a 3rd Generation assay with a functional sensitivity of <=0.01 uIU/mL. Performed at [facility], [address] [place], [place] [address]    Vital Signs: Admission weight: 308.86 lbs Temp:  [97.4 F (36.3 C)-98.5 F (36.9 C)] 98.5 F (36.9 C) (12/03 0805) Pulse Rate:  [67-81] 73 (12/03 0805) Resp:  [16-20] 16 (12/03 0805) BP: (99-147)/(68-113) 147/96 (12/03 0805) SpO2:  [91 %-98 %] 98 % (12/03 0805)  Intake/Output Summary (Last 24 hours) at 07/29/2023 0806 Last data filed at 07/29/2023 0500 Gross per 24 hour  Intake 240 ml  Output 2800 ml  Net -2560 ml   Current Heart Failure Medications:  Loop diuretic: torsemide 40 mg daily Beta-Blocker: metoprolol succinate 25 mg daily ACEI/ARB/ARNI: lisinopril 20  mg daily MRA: spironolactone 25 mg  SGLT2i: Farxiga 10 mg daily Other:  Prior to admission Heart Failure Medications:  Loop diuretic: torsemide 40 mg daily Beta-Blocker: metoprolol succinate 25 mg daily ACEI/ARB/ARNI: lisinopril 20 mg daily (not taking losartan) MRA: spironolactone 25 mg daily SGLT2i: Stopped Jardiance due to diarrhea Other: Ozempic  Assessment: 1. Acute on chronic combined systolic and diastolic heart failure (LVEF 35% in 2019, now recovered to 65-70%) with grade II diastolic dysfunction and severe LVH, due to NICM. NYHA class III symptoms.  -Symptoms: Reports shortness of breath is improving along with LEE, however neither are at baseline. Still reports orthopnea. Reports  major issue is congestion and coughing. -Volume: Appears to still be nearly euvolemic with symptoms predominantly due to COPD, though substantially improved from admission. Weight was down 12 lbs on 12/1 with no weight in 2 days. Urine output the last couple days is charted as low. Creatinine was trending up, BMET pending today. Can adjust torsemide dose if creatinine is elevated.  -Hemodynamics: BP increasing this AM to 140s. Heart rate is stable in 70s. -BB: Continue current dose of metoprolol succinate. Rates are controlled. -ACEI/ARB/ARNI: Given no cost of Entresto, can consider transitioning to this given history of recovered LVEF. -MRA: Continue target dose of spironolactone 25 mg daily. -SGLT2i: Previously did not tolerate Jardiance due to nausea and diarrhea. Marcelline Deist ordered inpatient. A1c is elevated, will need to monitor closely to ensure that the patient is not at greater risk of UTI with heavy glucosuria.   Plan: 1) Medication changes recommended at this time: -None at this time. Will follow BMP. Can consider transitioning from lisinopril to [facility].  2) Patient assistance: -Sherryll Burger and Farxiga are $0  3) Education: - Patient has been educated on current HF medications and potential additions to HF medication regimen - Patient verbalizes understanding that over the next few months, these medication doses may change and more medications may be added to optimize HF regimen - Patient has been educated on basic disease state pathophysiology and goals of therapy  Medication Assistance / Insurance Benefits Check: Does the patient have prescription insurance?    Type of insurance plan:  Does the patient qualify for medication assistance through manufacturers or grants? Pending   Outpatient Pharmacy: Prior to admission outpatient pharmacy: [facility]     Please do not hesitate to reach out with questions or concerns,  Enos Fling, PharmD, CPP, BCPS Heart Failure Pharmacist  Phone -  [phone] 07/29/2023 8:06 AM

## 2023-07-29 NOTE — Progress Notes (Signed)
Occupational Therapy Treatment Patient Details Name: Cody Alexander MRN: [id number] DOB: 07-28-1956 Today's Date: 07/29/2023   History of present illness Pt is a 67 year old male presenting with SOB, CHF.  Was here ~5 weeks ago with similar  with chest pain and apparent heart attack.   PMH significant for CHF, COPD, CAD, HTN, T2DM, OSA      OT comments  Pt is seated in recliner on arrival. Pleasant and agreeable to OT session. He denies pain initially, then reports minimal R pinky toe pain. Pt performed STS MOD I and mobility in room with distant SUP. He performed 10 reps of chair pushups to maximize overall strength. Edu on use of shoe horn and reacher for LB dressing tasks. Pt wished to ambulate in hallway, placed on 4L and ~150 mobility performed with constant CGA/Min A at times d/t scissoring gait with fatigue and x2 instances of BLE buckling requiring use of handrail in hallway, x2 seated rest breaks on bench in hallway and chair follow from OT nearby during mobility trials. 02 on 4L dropped to high 80's at times, 83 when 02 became disconnected briefly, but increased once replaced to [facility]. Edu on pacing during gait for safety. Pt refusing chair alarm to be placed, therefore nurse notified and pt edu to call nurse for help when needing to get up. He verbalized understanding.  Pt left in recliner with all needs in place and will cont to require skilled acute OT services to maximize his safety and IND to return to PLOF.       If plan is discharge home, recommend the following:  A little help with bathing/dressing/bathroom;A little help with walking and/or transfers;Assistance with cooking/housework;Help with stairs or ramp for entrance   Equipment Recommendations  Tub/shower bench    Recommendations for Other Services      Precautions / Restrictions Precautions Precautions: Fall Precaution Comments: BLE buckling/shakiness Restrictions Weight Bearing Restrictions: No       Mobility  Bed Mobility               General bed mobility comments: NT up in recliner    Transfers Overall transfer level: Needs assistance Equipment used: None Transfers: Sit to/from Stand Sit to Stand: Modified independent (Device/Increase time)           General transfer comment: MOD I for STS in room and short distance mobility in room; however during mobility in the hallway ~50 feet he began with BLE buckling requiring use of handrail in hallway and to sit on bench; 02 drop to 83% when 02 cordf disconnect from him sitting on it; increased to 90% once 4L replaced; ambulated additional ~50 feet x2 with chair follow from other therapist closeby for safety with noted buckling and scissoring gait; pt reports this happens when he gets on steroids     Balance Overall balance assessment: Needs assistance Sitting-balance support: Feet supported Sitting balance-Leahy Scale: Normal     Standing balance support: No upper extremity supported, During functional activity Standing balance-Leahy Scale: Fair Standing balance comment: requiring CGA to Min A d/t bouts of BLE buckling today                           ADL either performed or assessed with clinical judgement   ADL Overall ADL's : Needs assistance/impaired                       Lower Body Dressing Details (  indicate cue type and reason): educated on use of shoe horn for LB dressing to don shoes and verbal instruction on use of reacher for donning pants                    Extremity/Trunk Assessment              Vision       Perception     Praxis      Cognition Arousal: Alert Behavior During Therapy: WFL for tasks assessed/performed Overall Cognitive Status: Within Functional Limits for tasks assessed                                          Exercises Other Exercises Other Exercises: Edu on use of PLB, compensatory strategies and AE/AD to maximize safety and IND.     Shoulder Instructions       General Comments still remains SOB needed 3L at rest and 4L with mobility trials; does drop to mid 80's with increased activity    Pertinent Vitals/ Pain       Pain Assessment Pain Assessment: Faces Faces Pain Scale: Hurts a little bit Pain Location: R pinky toe Pain Descriptors / Indicators: Aching Pain Intervention(s): Monitored during session, Patient requesting pain meds-RN notified  Home Living                                          Prior Functioning/Environment              Frequency  Min 1X/week        Progress Toward Goals  OT Goals(current goals can now be found in the care plan section)  Progress towards OT goals: Progressing toward goals  Acute Rehab OT Goals Patient Stated Goal: improve breathing and balance OT Goal Formulation: With patient Time For Goal Achievement: 08/08/23 Potential to Achieve Goals: Good  Plan      Co-evaluation                 AM-PAC OT "6 Clicks" Daily Activity     Outcome Measure   Help from another person eating meals?: None Help from another person taking care of personal grooming?: None Help from another person toileting, which includes using toliet, bedpan, or urinal?: A Little Help from another person bathing (including washing, rinsing, drying)?: A Little Help from another person to put on and taking off regular upper body clothing?: None Help from another person to put on and taking off regular lower body clothing?: A Little 6 Click Score: 21    End of Session Equipment Utilized During Treatment: Oxygen (chair follow)  OT Visit Diagnosis: Other abnormalities of gait and mobility (R26.89);Unsteadiness on feet (R26.81)   Activity Tolerance Patient tolerated treatment well   Patient Left in chair;with call bell/phone within reach;with nursing/sitter in room   Nurse Communication Mobility status;Precautions        Time: 1610-9604 OT Time Calculation  (min): 29 min  Charges: OT General Charges $OT Visit: 1 Visit OT Treatments $Therapeutic Activity: 23-37 mins  Yarethzy Croak, OTR/L  07/29/23, 4:13 PM   Danali Marinos E Roby Spalla 07/29/2023, 4:10 PM

## 2023-07-29 NOTE — Plan of Care (Signed)
  Problem: Education: Goal: Ability to describe self-care measures that may prevent or decrease complications (Diabetes Survival Skills Education) will improve Outcome: Progressing Goal: Individualized Educational Video(s) Outcome: Progressing   Problem: Coping: Goal: Ability to adjust to condition or change in health will improve Outcome: Progressing   Problem: Fluid Volume: Goal: Ability to maintain a balanced intake and output will improve Outcome: Progressing   Problem: Health Behavior/Discharge Planning: Goal: Ability to identify and utilize available resources and services will improve Outcome: Progressing Goal: Ability to manage health-related needs will improve Outcome: Progressing   Problem: Metabolic: Goal: Ability to maintain appropriate glucose levels will improve Outcome: Progressing   Problem: Nutritional: Goal: Maintenance of adequate nutrition will improve Outcome: Progressing Goal: Progress toward achieving an optimal weight will improve Outcome: Progressing   Problem: Skin Integrity: Goal: Risk for impaired skin integrity will decrease Outcome: Progressing   Problem: Tissue Perfusion: Goal: Adequacy of tissue perfusion will improve Outcome: Progressing   Problem: Education: Goal: Knowledge of General Education information will improve Description: Including pain rating scale, medication(s)/side effects and non-pharmacologic comfort measures Outcome: Progressing   Problem: Health Behavior/Discharge Planning: Goal: Ability to manage health-related needs will improve Outcome: Progressing   Problem: Clinical Measurements: Goal: Ability to maintain clinical measurements within normal limits will improve Outcome: Progressing Goal: Will remain free from infection Outcome: Progressing Goal: Diagnostic test results will improve Outcome: Progressing Goal: Respiratory complications will improve Outcome: Progressing Goal: Cardiovascular complication will  be avoided Outcome: Progressing   Problem: Activity: Goal: Risk for activity intolerance will decrease Outcome: Progressing   Problem: Nutrition: Goal: Adequate nutrition will be maintained Outcome: Progressing   Problem: Coping: Goal: Level of anxiety will decrease Outcome: Progressing   Problem: Elimination: Goal: Will not experience complications related to bowel motility Outcome: Progressing Goal: Will not experience complications related to urinary retention Outcome: Progressing   Problem: Pain Management: Goal: General experience of comfort will improve Outcome: Progressing   Problem: Safety: Goal: Ability to remain free from injury will improve Outcome: Progressing   Problem: Skin Integrity: Goal: Risk for impaired skin integrity will decrease Outcome: Progressing   Problem: Education: Goal: Ability to demonstrate management of disease process will improve Outcome: Progressing Goal: Ability to verbalize understanding of medication therapies will improve Outcome: Progressing Goal: Individualized Educational Video(s) Outcome: Progressing   Problem: Activity: Goal: Capacity to carry out activities will improve Outcome: Progressing   Problem: Cardiac: Goal: Ability to achieve and maintain adequate cardiopulmonary perfusion will improve Outcome: Progressing   Problem: Education: Goal: Knowledge of disease or condition will improve Outcome: Progressing Goal: Knowledge of the prescribed therapeutic regimen will improve Outcome: Progressing Goal: Individualized Educational Video(s) Outcome: Progressing   Problem: Activity: Goal: Ability to tolerate increased activity will improve Outcome: Progressing Goal: Will verbalize the importance of balancing activity with adequate rest periods Outcome: Progressing   Problem: Respiratory: Goal: Ability to maintain a clear airway will improve Outcome: Progressing Goal: Levels of oxygenation will improve Outcome:  Progressing Goal: Ability to maintain adequate ventilation will improve Outcome: Progressing

## 2023-07-30 DIAGNOSIS — I5033 Acute on chronic diastolic (congestive) heart failure: Secondary | ICD-10-CM | POA: Diagnosis not present

## 2023-07-30 LAB — GLUCOSE, CAPILLARY
Glucose-Capillary: 187 mg/dL — ABNORMAL HIGH (ref 70–99)
Glucose-Capillary: 196 mg/dL — ABNORMAL HIGH (ref 70–99)
Glucose-Capillary: 134 mg/dL — ABNORMAL HIGH (ref 70–99)
Glucose-Capillary: 198 mg/dL — ABNORMAL HIGH (ref 70–99)

## 2023-07-30 LAB — BASIC METABOLIC PANEL
Anion gap: 9 (ref 5–15)
BUN: 47 mg/dL — ABNORMAL HIGH (ref 8–23)
CO2: 33 mmol/L — ABNORMAL HIGH (ref 22–32)
Calcium: 8.3 mg/dL — ABNORMAL LOW (ref 8.9–10.3)
Chloride: 92 mmol/L — ABNORMAL LOW (ref 98–111)
Creatinine, Ser: 1.18 mg/dL (ref 0.61–1.24)
GFR, Estimated: >60 mL/min (ref >60)
Glucose, Bld: 221 mg/dL — ABNORMAL HIGH (ref 70–99)
Potassium: 4.5 mmol/L (ref 3.5–5.1)
Sodium: 134 mmol/L — ABNORMAL LOW (ref 135–145)

## 2023-07-30 MED ORDER — IPRATROPIUM-ALBUTEROL 0.5-2.5 (3) MG/3ML IN SOLN
3.0000 mL | Freq: Two times a day (BID) | RESPIRATORY_TRACT | Status: DC
  Administered 2023-07-30 – 2023-08-01 (×4): 3 mL via RESPIRATORY_TRACT
  Filled 2023-07-30 (×4): qty 3

## 2023-07-30 MED ORDER — ACETYLCYSTEINE 20 % IN SOLN
4.0000 mL | Freq: Two times a day (BID) | RESPIRATORY_TRACT | Status: DC
  Administered 2023-07-30 – 2023-08-02 (×7): 4 mL via RESPIRATORY_TRACT
  Filled 2023-07-30 (×8): qty 4

## 2023-07-30 MED ORDER — SODIUM CHLORIDE 0.9 % IV BOLUS
500.0000 mL | Freq: Once | INTRAVENOUS | Status: AC
  Administered 2023-07-30: 500 mL via INTRAVENOUS

## 2023-07-30 NOTE — Progress Notes (Signed)
PROGRESS NOTE    Cody Alexander  ZOX:[id number] DOB: Jun 14, 1956 DOA: 07/23/2023 PCP: [facility]  [id number]/[id number]-[id number]  LOS: 7 days   Brief hospital course:   Assessment & Plan: 67 year old male with known history of diastolic congestive heart failure and COPD who presents with progressive shortness of breath and mild bilateral lower extremity weakness.  Shortness of breath is slowly improving.  I suspect this is secondary to more decompensated COPD than CHF.  Mild pulmonary edema noted on chest imaging.  Trace lower extremity edema, BNP minimally elevated.   Will continue to treat for concomitant CHF and COPD.  Continue diuresis to maintain net euvolemia or mild net negative status.  IV steroids, bronchodilators.  Wean oxygen as tolerated.  Nocturnal CPAP.  Acute on chronic respiratory failure with hypoxia and hypercapnia (HCC) On 3-4L home O2 Likely secondary to both COPD and CHF exacerbation Weaned successfully off BiPAP Treatment pathways for COPD and CHF as below   Acute on chronic diastolic CHF (congestive heart failure) (HCC) Hypertensive emergency BP 161/110 on arrival Chest x-ray showing pulmonary vascular congestion Plan: Continue home diuretics Aldactone and torsemide Continue metoprolol  --hold Lisinopril due to hypotension   COPD with acute exacerbation (HCC) The primary driver for patient's symptoms.  RVP and COVID-negative. Refractory to therapy thus far --received 7 days of solumedrol, transitioned to prednisone Plan: --cont prednisone 40 mg daily --start Mucomyst neb BID  Weakness 2/2 Hypotension --BP dropped to 80's today --hold Lisinopril --500 ml bolus   Elevated troponin CAD with history of stent Troponin elevated to 88, likely demand ischemia --cont ASA and statin --cont Toprol    Uncontrolled type 2 diabetes mellitus with hyperglycemia, with long-term current use of insulin (HCC) --cont glargine 28u daily --mealtime 10u  TID --ACHS and SSI   OSA (obstructive sleep apnea) Currently on BiPAP   Obesity, Class III, BMI 40-49.9 (morbid obesity) (HCC) Complicating factor to overall prognosis and care    DVT prophylaxis: Lovenox SQ Code Status: Full code  Family Communication:  Level of care: Progressive Dispo:   The patient is from: home Anticipated d/c is to: home Anticipated d/c date is: 1-2 days   Subjective and Interval History:  Pt complained of sputum that he couldn't bring up.  Later when working with PT was found to buckle at his knee, and found to have BP in 80's.  500 ml bolus given.   Objective: Vitals:   07/30/23 1301 07/30/23 1553 07/30/23 1730 07/30/23 1814  BP: (!) 86/62 91/80    Pulse: 73 95    Resp: 15 20 18 18   Temp: (!) 97.5 F (36.4 C) 98 F (36.7 C)    TempSrc:      SpO2: 97% 90%    Weight:      Height:        Intake/Output Summary (Last 24 hours) at 07/30/2023 1931 Last data filed at 07/30/2023 1900 Gross per 24 hour  Intake 240 ml  Output 1250 ml  Net -1010 ml   Filed Weights   07/23/23 0600 07/27/23 0512 07/30/23 0500  Weight: (!) 136.9 kg 130.7 kg 128.8 kg    Examination:   Constitutional: NAD, AAOx3 HEENT: conjunctivae and lids normal, EOMI CV: No cyanosis.   RESP: normal respiratory effort, on RA Extremities:  edema in BLE SKIN: warm, dry Neuro: II - XII grossly intact.   Psych: Normal mood and affect.  Appropriate judgement and reason   Data Reviewed: I have personally reviewed labs and imaging studies  Time spent: 50 minutes  Cody Priestly, MD [facility] If 7PM-7AM, please contact night-coverage 07/30/2023, 7:31 PM

## 2023-07-30 NOTE — Progress Notes (Addendum)
Pt lost IV access, IV consult in place. Per IV team, pt is not on any IV medications, will  not start a new IV access at this time. Per IV team, put in a stat order for IV access if pt is to start IV medications or in case of emergency.

## 2023-07-30 NOTE — Progress Notes (Signed)
Heart Failure Stewardship Pharmacy Note  PCP: [facility] PCP-Cardiologist: None  HPI: Cody Alexander is a 67 y.o. male with  Diastolic CHF, COPD on 3-4L O2, CAD, HTN, T2DM, OSA on CPAP, morbid obesity who presented with congestion and chest pain with cough as well as progressive dyspnea despite increasing home O2. HS-troponin on admission was 89 and BNP was 208.8. CXR showed cardiomegaly with mild central congestion.  Pertinent cardiac history: LVEF in 12/2016 was 35% with grade II diastolic dysfunction. Admitted with NSTEMI in 2019. LHC in 02/2018 showed minor nonobstructive CAD. LVEF improved to >70% on echo in 09/2018. Echo in 12/2022 showed LVEF of 65-70% with grade II diastolic dysfunction.   Pertinent Lab Values: Creatinine  Date Value Ref Range Status  08/20/2013 1.22 0.60 - 1.30 mg/dL Final   Creatinine, Ser  Date Value Ref Range Status  07/30/2023 1.18 0.61 - 1.24 mg/dL Final   BUN  Date Value Ref Range Status  07/30/2023 47 (H) 8 - 23 mg/dL Final  16/05/9603 11 7 - 18 mg/dL Final   Potassium  Date Value Ref Range Status  07/30/2023 4.5 3.5 - 5.1 mmol/L Final  08/20/2013 3.7 3.5 - 5.1 mmol/L Final   Sodium  Date Value Ref Range Status  07/30/2023 134 (L) 135 - 145 mmol/L Final  08/20/2013 134 (L) 136 - 145 mmol/L Final   B Natriuretic Peptide  Date Value Ref Range Status  07/23/2023 208.8 (H) 0.0 - 100.0 pg/mL Final    Comment:    Performed at [facility], [address] [place], [place] [address]   Magnesium  Date Value Ref Range Status  06/13/2023 2.2 1.7 - 2.4 mg/dL Final    Comment:    Performed at [facility], [address] [place], [place] [address]   Hemoglobin A1C  Date Value Ref Range Status  08/12/2013 6.9 (H) 4.2 - 6.3 % Final    Comment:    The American Diabetes Association recommends that a primary goal of therapy should be <7% and that physicians should reevaluate the treatment regimen in  patients with HbA1c values consistently >8%.    Hgb A1c MFr Bld  Date Value Ref Range Status  07/23/2023 10.5 (H) 4.8 - 5.6 % Final    Comment:    (NOTE) Pre diabetes:          5.7%-6.4%  Diabetes:              >6.4%  Glycemic control for   <7.0% adults with diabetes    TSH  Date Value Ref Range Status  01/05/2023 0.389 0.350 - 4.500 uIU/mL Final    Comment:    Performed by a 3rd Generation assay with a functional sensitivity of <=0.01 uIU/mL. Performed at [facility], [address] [place], [place] [address]    Vital Signs: Admission weight: 308.86 lbs Temp:  [97.5 F (36.4 C)-98.5 F (36.9 C)] 98.2 F (36.8 C) (12/04 0309) Pulse Rate:  [68-124] 68 (12/04 0309) Resp:  [16-19] 19 (12/04 0309) BP: (93-159)/(66-125) 110/93 (12/04 0309) SpO2:  [94 %-98 %] 96 % (12/04 0309) Weight:  [128.8 kg (283 lb 15.2 oz)] 128.8 kg (283 lb 15.2 oz) (12/04 0500)  Intake/Output Summary (Last 24 hours) at 07/30/2023 0751 Last data filed at 07/30/2023 0500 Gross per 24 hour  Intake 480 ml  Output 1675 ml  Net -1195 ml   Current Heart Failure Medications:  Loop diuretic: torsemide 40 mg daily Beta-Blocker: metoprolol succinate 25 mg daily  ACEI/ARB/ARNI: lisinopril 20 mg daily MRA: spironolactone 25 mg  SGLT2i: Farxiga 10 mg daily Other:  Prior to admission Heart Failure Medications:  Loop diuretic: torsemide 40 mg daily Beta-Blocker: metoprolol succinate 25 mg daily ACEI/ARB/ARNI: lisinopril 20 mg daily (not taking losartan) MRA: spironolactone 25 mg daily SGLT2i: Stopped Jardiance due to diarrhea Other: Ozempic  Assessment: 1. Acute on chronic combined systolic and diastolic heart failure (LVEF 35% in 2019, now recovered to 65-70%) with grade II diastolic dysfunction and severe LVH, due to NICM. NYHA class III symptoms.  -Symptoms: Reports the primary issue remains his upper respiratory symptoms with coughing and phlegm.  -Volume: Appears to still be euvolemic with  symptoms predominantly due to COPD. Weight today is down 15 lbs from 11/27. Urine output the last couple days is stable. Creatinine is improved. Continue current dose of torsemide. -Hemodynamics: BP down today. Heart rate is stable in 70s. -BB: Continue current dose of metoprolol succinate. Rates are controlled. -ACEI/ARB/ARNI: Given no cost of Entresto, can consider transitioning to this if BP rises again given history of recovered LVEF. Continue current meds today. -MRA: Continue target dose of spironolactone 25 mg daily. -SGLT2i: Previously did not tolerate Jardiance due to nausea and diarrhea. Marcelline Deist ordered inpatient. A1c is elevated, will need to monitor closely to ensure that the patient is not at greater risk of UTI with heavy glucosuria.   Plan: 1) Medication changes recommended at this time: -None at this time.   2) Patient assistance: -Sherryll Burger and Farxiga are $0  3) Education: - Patient has been educated on current HF medications and potential additions to HF medication regimen - Patient verbalizes understanding that over the next few months, these medication doses may change and more medications may be added to optimize HF regimen - Patient has been educated on basic disease state pathophysiology and goals of therapy  Medication Assistance / Insurance Benefits Check: Does the patient have prescription insurance?    Type of insurance plan:  Does the patient qualify for medication assistance through manufacturers or grants? Pending   Outpatient Pharmacy: Prior to admission outpatient pharmacy: [facility]     Please do not hesitate to reach out with questions or concerns,  Enos Fling, PharmD, CPP, BCPS Heart Failure Pharmacist  Phone - [phone] 07/30/2023 7:51 AM

## 2023-07-30 NOTE — Progress Notes (Signed)
Pt. Has no current indication for PIV start as of this time, no IV meds nor IV fluids ordered. Pt pulled out previous PIV as charted. RN advised to place a consult for PIV placement once indicated.

## 2023-07-30 NOTE — Progress Notes (Signed)
Physical Therapy Treatment Patient Details Name: Cody Alexander MRN: [id number] DOB: Jul 09, 1956 Today's Date: 07/30/2023   History of Present Illness Pt is a 67 year old male presenting with SOB, CHF.  Was here ~5 weeks ago with similar  with chest pain and apparent heart attack.   PMH significant for CHF, COPD, CAD, HTN, T2DM, OSA    PT Comments  Pt A&Ox4, reported increased tremors and difficulty with ambulation the last two days. Referenced R pinky toe pain but did not quantify. Pt was independent for bed mobility, able to don/doff shoes at EOB, and supervision for initial sit to stand transfer with RW. He was only able to ambulate ~59ft prior to experiencing significant buckling, difficulty maintaining gait path, and became unsteady, minA to sit in chair in room. He was able to return to standing and ambulate to the recliner. BP assessed twice, 81/49 and 82/62. Agreeable to return to supine, independently. Care team updated of pt status. The patient would benefit from further skilled PT intervention to continue to progress towards goals.       If plan is discharge home, recommend the following: Assist for transportation;Help with stairs or ramp for entrance   Can travel by private vehicle        Equipment Recommendations  Rolling walker (2 wheels)    Recommendations for Other Services       Precautions / Restrictions Precautions Precautions: Fall Precaution Comments: BLE buckling/shakiness Restrictions Weight Bearing Restrictions: No     Mobility  Bed Mobility Overal bed mobility: Independent             General bed mobility comments: impulsive    Transfers Overall transfer level: Needs assistance Equipment used: Rolling walker (2 wheels) Transfers: Sit to/from Stand Sit to Stand: Supervision, Min assist           General transfer comment: poor eccentric control to come back into sitting in a chair due to knees buckling    Ambulation/Gait Ambulation/Gait  assistance: Supervision, Min assist Gait Distance (Feet): 40 Feet Assistive device: Rolling walker (2 wheels)         General Gait Details: pt with significant buckling, very wobbly as ambulation went on   Stairs             Wheelchair Mobility     Tilt Bed    Modified Rankin (Stroke Patients Only)       Balance Overall balance assessment: Needs assistance Sitting-balance support: Feet supported Sitting balance-Leahy Scale: Good Sitting balance - Comments: don/doff shoes   Standing balance support: Bilateral upper extremity supported Standing balance-Leahy Scale: Poor                              Cognition Arousal: Alert Behavior During Therapy: WFL for tasks assessed/performed Overall Cognitive Status: Within Functional Limits for tasks assessed                                 General Comments: verbose        Exercises      General Comments        Pertinent Vitals/Pain Pain Assessment Pain Assessment: Faces Faces Pain Scale: Hurts little more Pain Location: R pinky toe Pain Descriptors / Indicators: Aching, Grimacing Pain Intervention(s): Limited activity within patient's tolerance, Monitored during session, Repositioned    Home Living  Prior Function            PT Goals (current goals can now be found in the care plan section) Progress towards PT goals: Progressing toward goals    Frequency    Min 1X/week      PT Plan      Co-evaluation              AM-PAC PT "6 Clicks" Mobility   Outcome Measure  Help needed turning from your back to your side while in a flat bed without using bedrails?: A Little Help needed moving from lying on your back to sitting on the side of a flat bed without using bedrails?: A Little Help needed moving to and from a bed to a chair (including a wheelchair)?: A Little Help needed standing up from a chair using your arms (e.g.,  wheelchair or bedside chair)?: A Little Help needed to walk in hospital room?: A Lot Help needed climbing 3-5 steps with a railing? : A Lot 6 Click Score: 16    End of Session Equipment Utilized During Treatment: Oxygen;Gait belt Activity Tolerance: Patient limited by fatigue;Other (comment) (limited by low BP) Patient left: in bed;with call bell/phone within reach Nurse Communication: Mobility status (low BP) PT Visit Diagnosis: Difficulty in walking, not elsewhere classified (R26.2)     Time: 1610-9604 PT Time Calculation (min) (ACUTE ONLY): 23 min  Charges:    $Therapeutic Activity: 23-37 mins PT General Charges $$ ACUTE PT VISIT: 1 Visit                     Olga Coaster PT, DPT 3:23 PM,07/30/23

## 2023-07-31 DIAGNOSIS — I5033 Acute on chronic diastolic (congestive) heart failure: Secondary | ICD-10-CM | POA: Diagnosis not present

## 2023-07-31 LAB — GLUCOSE, CAPILLARY
Glucose-Capillary: 146 mg/dL — ABNORMAL HIGH (ref 70–99)
Glucose-Capillary: 133 mg/dL — ABNORMAL HIGH (ref 70–99)
Glucose-Capillary: 236 mg/dL — ABNORMAL HIGH (ref 70–99)
Glucose-Capillary: 145 mg/dL — ABNORMAL HIGH (ref 70–99)

## 2023-07-31 LAB — BASIC METABOLIC PANEL
Anion gap: 8 (ref 5–15)
BUN: 40 mg/dL — ABNORMAL HIGH (ref 8–23)
CO2: 34 mmol/L — ABNORMAL HIGH (ref 22–32)
Calcium: 8.6 mg/dL — ABNORMAL LOW (ref 8.9–10.3)
Chloride: 93 mmol/L — ABNORMAL LOW (ref 98–111)
Creatinine, Ser: 1.12 mg/dL (ref 0.61–1.24)
GFR, Estimated: >60 mL/min (ref >60)
Glucose, Bld: 149 mg/dL — ABNORMAL HIGH (ref 70–99)
Potassium: 5 mmol/L (ref 3.5–5.1)
Sodium: 135 mmol/L (ref 135–145)

## 2023-07-31 LAB — CBC
HCT: 46.5 % (ref 39.0–52.0)
Hemoglobin: 14.5 g/dL (ref 13.0–17.0)
MCH: 28.4 pg (ref 26.0–34.0)
MCHC: 31.2 g/dL (ref 30.0–36.0)
MCV: 91 fL (ref 80.0–100.0)
Platelets: 265 10*3/uL (ref 150–400)
RBC: 5.11 MIL/uL (ref 4.22–5.81)
RDW: 13.8 % (ref 11.5–15.5)
WBC: 10.3 10*3/uL (ref 4.0–10.5)
nRBC: 0 % (ref 0.0–0.2)

## 2023-07-31 LAB — MAGNESIUM: Magnesium: 2.8 mg/dL — ABNORMAL HIGH (ref 1.7–2.4)

## 2023-07-31 MED ORDER — HYDROCOD POLI-CHLORPHE POLI ER 10-8 MG/5ML PO SUER
5.0000 mL | Freq: Two times a day (BID) | ORAL | Status: DC | PRN
  Administered 2023-07-31 – 2023-08-01 (×2): 5 mL via ORAL
  Filled 2023-07-31 (×2): qty 5

## 2023-07-31 NOTE — Progress Notes (Signed)
Occupational Therapy Treatment Patient Details Name: Cody Alexander MRN: [id number] DOB: 04-18-1956 Today's Date: 07/31/2023   History of present illness Pt is a 68 year old male presenting with SOB, CHF.  Was here ~5 weeks ago with similar  with chest pain and apparent heart attack.   PMH significant for CHF, COPD, CAD, HTN, T2DM, OSA   OT comments  Pt seen for OT treatment on this date. Upon arrival to room pt seated in chair, agreeable to tx. Pt's BP checked and no changed noted. Pt completed sit<>stand t/f's with sup-CGA. Pt ambulated to the bathroom and completed toileting needs in standing with supervision. Pt voided during this session. Pt's O2 monitored during session. Pt ambulated 10ft into the hall with CGA and took a prolonged sitting rest break. O2 monitored and desatted into the 80's. Pt encouraged to take deep breaths and pt's O2 went back into the 90's. Pt continued to ambulate 83ft with CGA and returned to room to recliner. Pt left seated in recliner with call bell within reach and RN in room. Pt making good progress toward goals, will continue to follow POC. Discharge recommendation remains appropriate.        If plan is discharge home, recommend the following:  A little help with bathing/dressing/bathroom;A little help with walking and/or transfers;Assistance with cooking/housework;Help with stairs or ramp for entrance   Equipment Recommendations  Tub/shower bench    Recommendations for Other Services      Precautions / Restrictions Precautions Precautions: Fall Restrictions Weight Bearing Restrictions: No       Mobility Bed Mobility                    Transfers Overall transfer level: Needs assistance   Transfers: Sit to/from Stand Sit to Stand: Contact guard assist, Supervision                 Balance Overall balance assessment: Needs assistance Sitting-balance support: Feet supported Sitting balance-Leahy Scale: Good Sitting balance -  Comments: don/doff shoes   Standing balance support: Bilateral upper extremity supported Standing balance-Leahy Scale: Good                             ADL either performed or assessed with clinical judgement   ADL Overall ADL's : Needs assistance/impaired                                     Functional mobility during ADLs: Contact guard assist;Supervision/safety General ADL Comments: Pt's BP checked and no changed noted. Pt ambulated to the bathroom and completed toileting needs in standing with supervision. Pt voided during this session. Pt's O2 monitored during session. Pt ambulated 46ft into the hall with CGA and took a prolonged sitting rest break. Pt continued to ambulated 27ft with CGA and returned to room.    Extremity/Trunk Assessment Upper Extremity Assessment Upper Extremity Assessment: Overall WFL for tasks assessed   Lower Extremity Assessment Lower Extremity Assessment: Overall WFL for tasks assessed        Vision       Perception     Praxis      Cognition Arousal: Alert Behavior During Therapy: WFL for tasks assessed/performed Overall Cognitive Status: Within Functional Limits for tasks assessed  General Comments: verbose        Exercises      Shoulder Instructions       General Comments      Pertinent Vitals/ Pain       Pain Assessment Pain Assessment: No/denies pain  Home Living                                          Prior Functioning/Environment              Frequency  Min 1X/week        Progress Toward Goals  OT Goals(current goals can now be found in the care plan section)  Progress towards OT goals: Progressing toward goals     Plan      Co-evaluation                 AM-PAC OT "6 Clicks" Daily Activity     Outcome Measure   Help from another person eating meals?: None Help from another person taking care of  personal grooming?: None Help from another person toileting, which includes using toliet, bedpan, or urinal?: A Little Help from another person bathing (including washing, rinsing, drying)?: A Little Help from another person to put on and taking off regular upper body clothing?: None Help from another person to put on and taking off regular lower body clothing?: A Little 6 Click Score: 21    End of Session Equipment Utilized During Treatment: Oxygen  OT Visit Diagnosis: Other abnormalities of gait and mobility (R26.89);Unsteadiness on feet (R26.81)   Activity Tolerance Patient tolerated treatment well   Patient Left in chair;with nursing/sitter in room;with call bell/phone within reach   Nurse Communication Mobility status        Time: 4098-1191 OT Time Calculation (min): 18 min  Charges:    Butch Penny, SOT

## 2023-07-31 NOTE — Progress Notes (Signed)
PROGRESS NOTE    Cody Alexander  ZOX:[id number] DOB: 08-12-1956 DOA: 07/23/2023 PCP: [facility]  232A/232A-AA  LOS: 8 days   Brief hospital course:   Assessment & Plan: 67 year old male with known history of diastolic congestive heart failure and COPD who presents with progressive shortness of breath and mild bilateral lower extremity weakness.  Shortness of breath is slowly improving.  I suspect this is secondary to more decompensated COPD than CHF.  Mild pulmonary edema noted on chest imaging.  Trace lower extremity edema, BNP minimally elevated.   Will continue to treat for concomitant CHF and COPD.  Continue diuresis to maintain net euvolemia or mild net negative status.  IV steroids, bronchodilators.  Wean oxygen as tolerated.  Nocturnal CPAP.  Acute on chronic respiratory failure with hypoxia and hypercapnia (HCC) On 3-4L home O2 Likely secondary to both COPD and CHF exacerbation Weaned successfully off BiPAP, currently on 3L O2. Treatment pathways for COPD and CHF as below   Acute on chronic diastolic CHF (congestive heart failure) (HCC) Hypertensive emergency BP 161/110 on arrival Chest x-ray showing pulmonary vascular congestion Plan: --cont home aldactone and torsemide Continue metoprolol  --hold Lisinopril due to hypotension   COPD with acute exacerbation (HCC) The primary driver for patient's symptoms.  RVP and COVID-negative. Refractory to therapy thus far --received 7 days of solumedrol, transitioned to prednisone Plan: --cont prednisone 40 mg daily --cont Mucomyst neb BID and Metaneb --chest PT  Weakness 2/2 Hypotension --BP dropped to 80's on 12/4, likely due to BP medication and receiving Tussinex q4h scheduled and oxycodone --hold Lisinopril --Tussinex now as PRN  Elevated troponin CAD with history of stent Troponin elevated to 88, likely demand ischemia --cont ASA and statin --cont Toprol   Uncontrolled type 2 diabetes  mellitus with hyperglycemia, with long-term current use of insulin (HCC) --cont glargine 28u daily --mealtime 10u TID --ACHS and SSI   OSA (obstructive sleep apnea) BiPAP PRN   Obesity, Class III, BMI 40-49.9 (morbid obesity) (HCC) Complicating factor to overall prognosis and care    DVT prophylaxis: Lovenox SQ Code Status: Full code  Family Communication:  Level of care: Progressive Dispo:   The patient is from: home Anticipated d/c is to: home Anticipated d/c date is: 1-2 days   Subjective and Interval History:  Pt reported breathing tx has started to help him bring up more sputum.  Complained of right toes hurting.   Objective: Vitals:   07/31/23 1156 07/31/23 1628 07/31/23 1817 07/31/23 1948  BP: 109/65 109/66  (!) 114/95  Pulse: 72 65 68 67  Resp: 16 17 20 20   Temp: 97.6 F (36.4 C) 98.2 F (36.8 C)  98.2 F (36.8 C)  TempSrc:  Oral    SpO2: 93% 90% 90% (!) 88%  Weight:      Height:        Intake/Output Summary (Last 24 hours) at 07/31/2023 2002 Last data filed at 07/31/2023 1329 Gross per 24 hour  Intake 960 ml  Output 1870 ml  Net -910 ml   Filed Weights   07/27/23 0512 07/30/23 0500 07/31/23 0324  Weight: 130.7 kg 128.8 kg 132.6 kg    Examination:   Constitutional: NAD, AAOx3 HEENT: conjunctivae and lids normal, EOMI CV: No cyanosis.   RESP: normal respiratory effort Extremities: edema in BLE SKIN: warm, dry Neuro: II - XII grossly intact.   Psych: Normal mood and affect.  Appropriate judgement and reason   Data Reviewed: I have personally reviewed labs and imaging  studies  Time spent: 50 minutes  [name], MD [facility] If 7PM-7AM, please contact night-coverage 07/31/2023, 8:02 PM

## 2023-07-31 NOTE — Progress Notes (Signed)
Heart Failure Stewardship Pharmacy Note  PCP: [facility] PCP-Cardiologist: None  HPI: Cody Alexander is a 67 y.o. male with  Diastolic CHF, COPD on 3-4L O2, CAD, HTN, T2DM, OSA on CPAP, morbid obesity who presented with congestion and chest pain with cough as well as progressive dyspnea despite increasing home O2. HS-troponin on admission was 89 and BNP was 208.8. CXR showed cardiomegaly with mild central congestion.  Pertinent cardiac history: LVEF in 12/2016 was 35% with grade II diastolic dysfunction. Admitted with NSTEMI in 2019. LHC in 02/2018 showed minor nonobstructive CAD. LVEF improved to >70% on echo in 09/2018. Echo in 12/2022 showed LVEF of 65-70% with grade II diastolic dysfunction.   Pertinent Lab Values: Creatinine  Date Value Ref Range Status  08/20/2013 1.22 0.60 - 1.30 mg/dL Final   Creatinine, Ser  Date Value Ref Range Status  07/31/2023 1.12 0.61 - 1.24 mg/dL Final   BUN  Date Value Ref Range Status  07/31/2023 40 (H) 8 - 23 mg/dL Final  73/22/0254 11 7 - 18 mg/dL Final   Potassium  Date Value Ref Range Status  07/31/2023 5.0 3.5 - 5.1 mmol/L Final  08/20/2013 3.7 3.5 - 5.1 mmol/L Final   Sodium  Date Value Ref Range Status  07/31/2023 135 135 - 145 mmol/L Final  08/20/2013 134 (L) 136 - 145 mmol/L Final   B Natriuretic Peptide  Date Value Ref Range Status  07/23/2023 208.8 (H) 0.0 - 100.0 pg/mL Final    Comment:    Performed at [facility], [address] [place], [place] [address]   Magnesium  Date Value Ref Range Status  07/31/2023 2.8 (H) 1.7 - 2.4 mg/dL Final    Comment:    Performed at [facility], [address] [place], [place] [address]   Hemoglobin A1C  Date Value Ref Range Status  08/12/2013 6.9 (H) 4.2 - 6.3 % Final    Comment:    The American Diabetes Association recommends that a primary goal of therapy should be <7% and that physicians should reevaluate the treatment regimen in  patients with HbA1c values consistently >8%.    Hgb A1c MFr Bld  Date Value Ref Range Status  07/23/2023 10.5 (H) 4.8 - 5.6 % Final    Comment:    (NOTE) Pre diabetes:          5.7%-6.4%  Diabetes:              >6.4%  Glycemic control for   <7.0% adults with diabetes    TSH  Date Value Ref Range Status  01/05/2023 0.389 0.350 - 4.500 uIU/mL Final    Comment:    Performed by a 3rd Generation assay with a functional sensitivity of <=0.01 uIU/mL. Performed at [facility], [address] [place], [place] [address]    Vital Signs: Admission weight: 308.86 lbs Temp:  [97.5 F (36.4 C)-98.7 F (37.1 C)] 97.8 F (36.6 C) (12/05 0749) Pulse Rate:  [70-95] 77 (12/05 0759) Resp:  [15-20] 16 (12/05 0759) BP: (86-130)/(62-85) 121/75 (12/05 0749) SpO2:  [89 %-99 %] 89 % (12/05 0759) Weight:  [132.6 kg (292 lb 6.4 oz)] 132.6 kg (292 lb 6.4 oz) (12/05 0324)  Intake/Output Summary (Last 24 hours) at 07/31/2023 1121 Last data filed at 07/31/2023 0800 Gross per 24 hour  Intake 480 ml  Output 1820 ml  Net -1340 ml   Current Heart Failure Medications:  Loop diuretic: torsemide 40 mg daily Beta-Blocker: metoprolol succinate 25 mg daily  ACEI/ARB/ARNI: lisinopril 20 mg daily MRA: spironolactone 25 mg  SGLT2i: Farxiga 10 mg daily   Prior to admission Heart Failure Medications:  Loop diuretic: torsemide 40 mg daily Beta-Blocker: metoprolol succinate 25 mg daily ACEI/ARB/ARNI: lisinopril 20 mg daily (not taking losartan) MRA: spironolactone 25 mg daily SGLT2i: Stopped Jardiance due to diarrhea Other: Ozempic  Assessment: 1. Acute on chronic combined systolic and diastolic heart failure (LVEF 35% in 2019, now recovered to 65-70%) with grade II diastolic dysfunction and severe LVH, due to NICM. NYHA class III symptoms.  -Symptoms: Reports the primary issue remains his upper respiratory symptoms with coughing and phlegm. Weight is up from last check, but still down since  admission.  Able to walk without weakness and fatigue today. -Volume: Appears to still be euvolemic with symptoms predominantly due to COPD. Urine output the last couple days is stable. Creatinine is normal. Weight is slightly up, through still overall down from admission. Suspect abdomen is a reservoir. With symptom improvement can continue torsemide today, but may require dose increase tomorrow. -Hemodynamics: BP stable today. Heart rate is stable in 70s. -BB: Continue current dose of metoprolol succinate. Rates are controlled. -ACEI/ARB/ARNI: Given no cost of Entresto, can consider transitioning to this if BP rises again given history of recovered LVEF. Continue current meds today. -MRA: Continue target dose of spironolactone 25 mg daily. -SGLT2i: Previously did not tolerate Jardiance due to nausea and diarrhea. Marcelline Deist ordered inpatient. A1c is elevated, will need to monitor closely to ensure that the patient is not at greater risk of UTI with heavy glucosuria.   Plan: 1) Medication changes recommended at this time: -None at this time.   2) Patient assistance: -Sherryll Burger and Farxiga are $0  3) Education: - Patient has been educated on current HF medications and potential additions to HF medication regimen - Patient verbalizes understanding that over the next few months, these medication doses may change and more medications may be added to optimize HF regimen - Patient has been educated on basic disease state pathophysiology and goals of therapy  Medication Assistance / Insurance Benefits Check: Does the patient have prescription insurance?    Type of insurance plan:  Does the patient qualify for medication assistance through manufacturers or grants? Pending   Outpatient Pharmacy: Prior to admission [facility]     Please do not hesitate to reach out with questions or concerns,  Enos Fling, PharmD, CPP, BCPS Heart Failure Pharmacist  Phone - [phone] 07/31/2023  11:21 AM

## 2023-07-31 NOTE — TOC Progression Note (Signed)
Transition of Care [facility]) - Progression Note    Patient Details  Name: Cody Alexander MRN: [id number] Date of Birth: 11/09/1955  Transition of Care [facility]) CM/SW Contact  Truddie Hidden, RN Phone Number: 07/31/2023, 9:59 AM  Clinical Narrative:    TOC continuing to follow patient's progress throughout discharge planning.   Expected Discharge Plan: Home w Home Health Services Barriers to Discharge: Continued Medical Work up  Expected Discharge Plan and Services     Post Acute Care Choice: Resumption of Svcs/PTA Provider Living arrangements for the past 2 months: Single Family Home                           HH Arranged: RN, PT, OT, Nurse's Aide HH Agency: [facility] Date [facility] Agency Contacted: 07/23/23   Representative spoke with at [facility] Agency: [facility]   Social Determinants of Health (SDOH) Interventions SDOH Screenings   Food Insecurity: No Food Insecurity (07/23/2023)  Housing: Medium Risk (07/23/2023)  Transportation Needs: Unmet Transportation Needs (07/23/2023)  Utilities: Not At Risk (07/23/2023)  Alcohol Screen: Low Risk  (07/23/2023)  Financial Resource Strain: Low Risk  (07/23/2023)  Physical Activity: Inactive (07/02/2021)   Received from [facility], [facility]  Social Connections: Moderately Isolated (10/21/2022)   Received from [facility], [facility]  Stress: Stress Concern Present (10/21/2022)   Received from [facility], [facility]  Tobacco Use: Medium Risk (07/23/2023)  Health Literacy: Medium Risk (10/21/2022)   Received from [facility], [facility]    Readmission Risk Interventions    07/23/2023    1:58 PM 06/25/2022   10:13 AM  Readmission Risk Prevention Plan  Transportation Screening Complete Complete  PCP or Specialist Appt within 3-5 Days Complete Complete  HRI or Home Care Consult Complete Complete  Social Work Consult for Recovery Care Planning/Counseling Complete Complete   Palliative Care Screening Not Applicable Not Applicable  Medication Review Oceanographer) Complete Complete

## 2023-07-31 NOTE — Plan of Care (Signed)
  Problem: Education: Goal: Ability to describe self-care measures that may prevent or decrease complications (Diabetes Survival Skills Education) will improve Outcome: Progressing Goal: Individualized Educational Video(s) Outcome: Progressing   Problem: Coping: Goal: Ability to adjust to condition or change in health will improve Outcome: Progressing   

## 2023-07-31 NOTE — Care Management Important Message (Signed)
Important Message  Patient Details  Name: Cody Alexander MRN: [id number] Date of Birth: [birth year]   Important Message Given:  Yes - Medicare IM     Verita Schneiders Jemia Fata 07/31/2023, 3:23 PM

## 2023-08-01 DIAGNOSIS — J441 Chronic obstructive pulmonary disease with (acute) exacerbation: Secondary | ICD-10-CM

## 2023-08-01 LAB — GLUCOSE, CAPILLARY
Glucose-Capillary: 67 mg/dL — ABNORMAL LOW (ref 70–99)
Glucose-Capillary: 223 mg/dL — ABNORMAL HIGH (ref 70–99)
Glucose-Capillary: 300 mg/dL — ABNORMAL HIGH (ref 70–99)
Glucose-Capillary: 184 mg/dL — ABNORMAL HIGH (ref 70–99)
Glucose-Capillary: 171 mg/dL — ABNORMAL HIGH (ref 70–99)

## 2023-08-01 MED ORDER — INSULIN GLARGINE-YFGN 100 UNIT/ML ~~LOC~~ SOLN
25.0000 [IU] | Freq: Every day | SUBCUTANEOUS | Status: DC
  Administered 2023-08-01 – 2023-08-03 (×3): 25 [IU] via SUBCUTANEOUS
  Filled 2023-08-01 (×3): qty 0.25

## 2023-08-01 MED ORDER — IPRATROPIUM-ALBUTEROL 0.5-2.5 (3) MG/3ML IN SOLN
3.0000 mL | Freq: Three times a day (TID) | RESPIRATORY_TRACT | Status: DC
  Administered 2023-08-01 – 2023-08-03 (×5): 3 mL via RESPIRATORY_TRACT
  Filled 2023-08-01 (×5): qty 3

## 2023-08-01 MED ORDER — OXYCODONE HCL 5 MG PO TABS
5.0000 mg | ORAL_TABLET | ORAL | Status: DC | PRN
  Administered 2023-08-01 – 2023-08-02 (×2): 5 mg via ORAL
  Filled 2023-08-01 (×2): qty 1

## 2023-08-01 MED ORDER — GUAIFENESIN-DM 100-10 MG/5ML PO SYRP
5.0000 mL | ORAL_SOLUTION | Freq: Four times a day (QID) | ORAL | Status: DC
  Administered 2023-08-01 – 2023-08-02 (×4): 5 mL via ORAL
  Filled 2023-08-01 (×5): qty 10

## 2023-08-01 MED ORDER — HYDROCOD POLI-CHLORPHE POLI ER 10-8 MG/5ML PO SUER
5.0000 mL | Freq: Four times a day (QID) | ORAL | Status: DC
  Administered 2023-08-01 – 2023-08-03 (×8): 5 mL via ORAL
  Filled 2023-08-01 (×9): qty 5

## 2023-08-01 NOTE — Progress Notes (Signed)
  CBG 68, notified MD of 10u dose of novolog with meals.  Dr. Fran Lowes responded to hold dose for breakfast meal and resume with lunch.  Patient educated.     08/01/23 2952  Provider Notification  Provider Name/Title Dr. Fran Lowes Attending  Date Provider Notified 08/01/23  Time Provider Notified [phone]  Method of Notification  (Secure Chat)  Notification Reason Other (Comment) (CBG 67, due for 10u novolog with breakfast)  Provider response Evaluate remotely;See new orders  Date of Provider Response 08/01/23  Time of Provider Response 0830

## 2023-08-01 NOTE — Progress Notes (Signed)
Hypoglycemic Event  CBG: 68 at 0821  Treatment: 4 oz juice/soda  Symptoms: None  Follow-up CBG: Time:1002 CBG Result:223  Possible Reasons for Event: Inadequate meal intake and Medication regimen: SEMGLEE  Comments/MD notified: Dr. Fran Lowes notified.

## 2023-08-01 NOTE — Progress Notes (Signed)
PROGRESS NOTE    Cody Alexander  WGN:[id number] DOB: [birth year] DOA: 07/23/2023 PCP: [facility]  232A/232A-AA  LOS: 9 days   Brief hospital course:   Assessment & Plan: 67 year old male with known history of diastolic congestive heart failure and COPD who presents with progressive shortness of breath and mild bilateral lower extremity weakness.  Shortness of breath is slowly improving.  I suspect this is secondary to more decompensated COPD than CHF.  Mild pulmonary edema noted on chest imaging.  Trace lower extremity edema, BNP minimally elevated.   Will continue to treat for concomitant CHF and COPD.  Continue diuresis to maintain net euvolemia or mild net negative status.  IV steroids, bronchodilators.  Wean oxygen as tolerated.  Nocturnal CPAP.  Acute on chronic respiratory failure with hypoxia and hypercapnia (HCC) On 3-4L home O2 Likely secondary to both COPD and CHF exacerbation Weaned successfully off BiPAP, currently on 3L O2. Treatment pathways for COPD and CHF as below   Acute on chronic diastolic CHF (congestive heart failure) (HCC) Hypertensive emergency BP 161/110 on arrival Chest x-ray showing pulmonary vascular congestion Plan: --cont home aldactone and torsemide --cont metop --hold Lisinopril due to hypotension   COPD with acute exacerbation (HCC) The primary driver for patient's symptoms.  RVP and COVID-negative. Refractory to therapy thus far --received 7 days of solumedrol, transitioned to prednisone Plan: --cont prednisone 40 mg daily --cont Mucomyst neb BID and Metaneb --chest PT --re-schedule Tussionex q6h, per pt strong preference  Weakness 2/2 Hypotension --BP dropped to 80's on 12/4, likely due to BP medication and receiving Tussionex q4h scheduled and oxycodone --Hold Lisinopril  Elevated troponin CAD with history of stent Troponin elevated to 88, likely demand ischemia --cont ASA and statin --cont Toprol    Uncontrolled type 2 diabetes mellitus with hyperglycemia, with long-term current use of insulin (HCC) --cont glargine 28u daily --mealtime 10u TID --ACHS and SSI   OSA (obstructive sleep apnea) BiPAP PRN   Obesity, Class III, BMI 40-49.9 (morbid obesity) (HCC) Complicating factor to overall prognosis and care    DVT prophylaxis: Lovenox SQ Code Status: Full code  Family Communication:  Level of care: Progressive Dispo:   The patient is from: home Anticipated d/c is to: home Anticipated d/c date is: 1-2 days   Subjective and Interval History:  Cough worsened after scheduled q4h Tussionex changed to BID PRN.  Foot pain improved.  Weakness improved, no trouble walking now.   Objective: Vitals:   08/01/23 0824 08/01/23 0957 08/01/23 1225 08/01/23 1623  BP: 122/89  (!) 106/59 102/61  Pulse: 72 79 75 62  Resp: 17 18 15 16   Temp: 97.6 F (36.4 C)  98 F (36.7 C) 97.8 F (36.6 C)  TempSrc: Oral   Oral  SpO2: 97% (!) 89% 90% 93%  Weight:      Height:        Intake/Output Summary (Last 24 hours) at 08/01/2023 1848 Last data filed at 08/01/2023 1400 Gross per 24 hour  Intake --  Output 1600 ml  Net -1600 ml   Filed Weights   07/30/23 0500 07/31/23 0324 08/01/23 0500  Weight: 128.8 kg 132.6 kg 131.6 kg    Examination:   Constitutional: NAD, AAOx3 HEENT: conjunctivae and lids normal, EOMI CV: No cyanosis.   RESP: normal respiratory effort SKIN: warm, dry Neuro: II - XII grossly intact.   Psych: Normal mood and affect.  Appropriate judgement and reason   Data Reviewed: I have personally reviewed labs and imaging studies  Time spent: 35 minutes  [name], MD [facility] If 7PM-7AM, please contact night-coverage 08/01/2023, 6:48 PM

## 2023-08-01 NOTE — Plan of Care (Signed)
Alert and oriented. Continues with cough. Alerted Dr. Fran Lowes that cough not well controlled. Pain in foot continues, medicated with PRN medications, see MAR.   Problem: Coping: Goal: Ability to adjust to condition or change in health will improve Outcome: Progressing   Problem: Health Behavior/Discharge Planning: Goal: Ability to identify and utilize available resources and services will improve Outcome: Progressing   Problem: Skin Integrity: Goal: Risk for impaired skin integrity will decrease Outcome: Progressing   Problem: Education: Goal: Ability to demonstrate management of disease process will improve Outcome: Progressing Goal: Ability to verbalize understanding of medication therapies will improve Outcome: Progressing Goal: Individualized Educational Video(s) Outcome: Progressing   Problem: Education: Goal: Knowledge of disease or condition will improve Outcome: Progressing Goal: Knowledge of the prescribed therapeutic regimen will improve Outcome: Progressing

## 2023-08-01 NOTE — Progress Notes (Signed)
Heart Failure Stewardship Pharmacy Note  PCP: [facility] [facility] [employer] PCP-Cardiologist: None  HPI: Cody Alexander is a 67 y.o. male with  Diastolic CHF, COPD on 3-4L O2, CAD, HTN, T2DM, OSA on CPAP, morbid obesity who presented with congestion and chest pain with cough as well as progressive dyspnea despite increasing home O2. HS-troponin on admission was 89 and BNP was 208.8. CXR showed cardiomegaly with mild central congestion.  Pertinent cardiac history: LVEF in 12/2016 was 35% with grade II diastolic dysfunction. Admitted with NSTEMI in 2019. LHC in 02/2018 showed minor nonobstructive CAD. LVEF improved to >70% on echo in 09/2018. Echo in 12/2022 showed LVEF of 65-70% with grade II diastolic dysfunction.   Pertinent Lab Values: Creatinine  Date Value Ref Range Status  08/20/2013 1.22 0.60 - 1.30 mg/dL Final   Creatinine, Ser  Date Value Ref Range Status  07/31/2023 1.12 0.61 - 1.24 mg/dL Final   BUN  Date Value Ref Range Status  07/31/2023 40 (H) 8 - 23 mg/dL Final  16/05/9603 11 7 - 18 mg/dL Final   Potassium  Date Value Ref Range Status  07/31/2023 5.0 3.5 - 5.1 mmol/L Final  08/20/2013 3.7 3.5 - 5.1 mmol/L Final   Sodium  Date Value Ref Range Status  07/31/2023 135 135 - 145 mmol/L Final  08/20/2013 134 (L) 136 - 145 mmol/L Final   B Natriuretic Peptide  Date Value Ref Range Status  07/23/2023 208.8 (H) 0.0 - 100.0 pg/mL Final    Comment:    Performed at [facility], [address] [place], [place] [address]   Magnesium  Date Value Ref Range Status  07/31/2023 2.8 (H) 1.7 - 2.4 mg/dL Final    Comment:    Performed at [facility], [address] [place], [place] [address]   Hemoglobin A1C  Date Value Ref Range Status  08/12/2013 6.9 (H) 4.2 - 6.3 % Final    Comment:    The American Diabetes Association recommends that a primary goal of therapy should be <7% and that physicians should reevaluate the treatment regimen in  patients with HbA1c values consistently >8%.    Hgb A1c MFr Bld  Date Value Ref Range Status  07/23/2023 10.5 (H) 4.8 - 5.6 % Final    Comment:    (NOTE) Pre diabetes:          5.7%-6.4%  Diabetes:              >6.4%  Glycemic control for   <7.0% adults with diabetes    TSH  Date Value Ref Range Status  01/05/2023 0.389 0.350 - 4.500 uIU/mL Final    Comment:    Performed by a 3rd Generation assay with a functional sensitivity of <=0.01 uIU/mL. Performed at [facility], [address] [place], [place] [address]    Vital Signs: Admission weight: 308.86 lbs Temp:  [97.6 F (36.4 C)-98.5 F (36.9 C)] 97.8 F (36.6 C) (12/06 0637) Pulse Rate:  [64-72] 64 (12/06 0637) Resp:  [16-20] 18 (12/06 6213) BP: (109-115)/(65-95) 115/78 (12/06 0637) SpO2:  [90 %-95 %] 91 % (12/06 0637) Weight:  [131.6 kg (290 lb 1.6 oz)] 131.6 kg (290 lb 1.6 oz) (12/06 0500)  Intake/Output Summary (Last 24 hours) at 08/01/2023 0801 Last data filed at 07/31/2023 1329 Gross per 24 hour  Intake 480 ml  Output 400 ml  Net 80 ml   Current Heart Failure Medications:  Loop diuretic: torsemide 40 mg daily Beta-Blocker: metoprolol succinate 25 mg daily  ACEI/ARB/ARNI: lisinopril 20 mg daily MRA: spironolactone 25 mg  SGLT2i: Farxiga 10 mg daily  Prior to admission Heart Failure Medications:  Loop diuretic: torsemide 40 mg daily Beta-Blocker: metoprolol succinate 25 mg daily ACEI/ARB/ARNI: lisinopril 20 mg daily (not taking losartan) MRA: spironolactone 25 mg daily SGLT2i: Stopped Jardiance due to diarrhea Other: Ozempic  Assessment: 1. Acute on chronic combined systolic and diastolic heart failure (LVEF 35% in 2019, now recovered to 65-70%) with grade II diastolic dysfunction and severe LVH, due to NICM. NYHA class III symptoms.  -Symptoms: Reports the primary issue remains his upper respiratory symptoms with coughing and phlegm despite mucolytics and nebs. Weight is up from last check,  but still down since admission.  Able to walk without weakness and fatigue today. -Volume: Appears to still be euvolemic with symptoms predominantly due to COPD. Urine output the last couple days is stable. Creatinine was normal last check. Weight is trending back down. Continue oral diuretic at current dose today. -Hemodynamics: BP stable today. Heart rate is stable in 70s. -BB: Continue current dose of metoprolol succinate. Rates are controlled. -ACEI/ARB/ARNI: Since BP is down, will trend for now and consider when stable. -MRA: Continue target dose of spironolactone 25 mg daily. -SGLT2i: Previously did not tolerate Jardiance due to nausea and diarrhea. Marcelline Deist ordered inpatient. A1c is elevated, will need to monitor closely to ensure that the patient is not at greater risk of UTI with heavy glucosuria.   Plan: 1) Medication changes recommended at this time: -None at this time.   2) Patient assistance: -Sherryll Burger and Farxiga are $0  3) Education: - Patient has been educated on current HF medications and potential additions to HF medication regimen - Patient verbalizes understanding that over the next few months, these medication doses may change and more medications may be added to optimize HF regimen - Patient has been educated on basic disease state pathophysiology and goals of therapy  Medication Assistance / Insurance Benefits Check: Does the patient have prescription insurance?    Type of insurance plan:  Does the patient qualify for medication assistance through manufacturers or grants? Pending   Outpatient Pharmacy: Prior to admission [facility]     Please do not hesitate to reach out with questions or concerns,  Enos Fling, PharmD, CPP, BCPS Heart Failure Pharmacist  Phone - [phone] 08/01/2023 8:01 AM

## 2023-08-01 NOTE — Plan of Care (Signed)
  Problem: Coping: Goal: Ability to adjust to condition or change in health will improve Outcome: Progressing   Problem: Fluid Volume: Goal: Ability to maintain a balanced intake and output will improve Outcome: Progressing   Problem: Health Behavior/Discharge Planning: Goal: Ability to identify and utilize available resources and services will improve Outcome: Progressing   

## 2023-08-02 DIAGNOSIS — I5033 Acute on chronic diastolic (congestive) heart failure: Secondary | ICD-10-CM | POA: Diagnosis not present

## 2023-08-02 LAB — BASIC METABOLIC PANEL
Anion gap: 10 (ref 5–15)
BUN: 42 mg/dL — ABNORMAL HIGH (ref 8–23)
CO2: 37 mmol/L — ABNORMAL HIGH (ref 22–32)
Calcium: 9.3 mg/dL (ref 8.9–10.3)
Chloride: 89 mmol/L — ABNORMAL LOW (ref 98–111)
Creatinine, Ser: 1.26 mg/dL — ABNORMAL HIGH (ref 0.61–1.24)
GFR, Estimated: >60 mL/min (ref >60)
Glucose, Bld: 171 mg/dL — ABNORMAL HIGH (ref 70–99)
Potassium: 4.9 mmol/L (ref 3.5–5.1)
Sodium: 136 mmol/L (ref 135–145)

## 2023-08-02 LAB — GLUCOSE, CAPILLARY
Glucose-Capillary: 146 mg/dL — ABNORMAL HIGH (ref 70–99)
Glucose-Capillary: 158 mg/dL — ABNORMAL HIGH (ref 70–99)
Glucose-Capillary: 235 mg/dL — ABNORMAL HIGH (ref 70–99)
Glucose-Capillary: 236 mg/dL — ABNORMAL HIGH (ref 70–99)
Glucose-Capillary: 257 mg/dL — ABNORMAL HIGH (ref 70–99)

## 2023-08-02 MED ORDER — RACEPINEPHRINE HCL 2.25 % IN NEBU
0.5000 mL | INHALATION_SOLUTION | Freq: Two times a day (BID) | RESPIRATORY_TRACT | Status: DC
  Administered 2023-08-02 – 2023-08-03 (×2): 0.5 mL via RESPIRATORY_TRACT
  Filled 2023-08-02 (×3): qty 0.5

## 2023-08-02 MED ORDER — OXYCODONE HCL 5 MG PO TABS
5.0000 mg | ORAL_TABLET | Freq: Four times a day (QID) | ORAL | Status: DC | PRN
  Administered 2023-08-02: 5 mg via ORAL
  Filled 2023-08-02: qty 1

## 2023-08-02 MED ORDER — FLUCONAZOLE 100 MG PO TABS
200.0000 mg | ORAL_TABLET | ORAL | Status: DC
  Administered 2023-08-02: 200 mg via ORAL
  Filled 2023-08-02: qty 2

## 2023-08-02 MED ORDER — NYSTATIN 100000 UNIT/GM EX CREA
TOPICAL_CREAM | Freq: Two times a day (BID) | CUTANEOUS | Status: DC
  Filled 2023-08-02: qty 30

## 2023-08-02 MED ORDER — RACEPINEPHRINE HCL 2.25 % IN NEBU
0.5000 mL | INHALATION_SOLUTION | Freq: Once | RESPIRATORY_TRACT | Status: AC
  Administered 2023-08-02: 0.5 mL via RESPIRATORY_TRACT
  Filled 2023-08-02: qty 0.5

## 2023-08-02 NOTE — Plan of Care (Signed)

## 2023-08-02 NOTE — Progress Notes (Signed)
       CROSS COVER NOTE  NAME: Jerrard Sponaugle MRN: [id number] DOB : 01-28-1956    Concern as stated by nurse / staff   Requested by nurse while on unit to for antifungal treatment for ring worm on patient's back     Pertinent findings on chart review: Patient in with COPD and chf exacerbation. Has history of chronic resp failure, OSA CAD, Obesity and uncontrolled DM. Patient said it has been there, increase in itching recent. 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 Chart review previous work up for cysts for plans of removal given location   Media Information  Document Information  Photos  [place] ringworm  08/02/2023 21:57  Attached To:  Hospital Encounter on 07/23/23  Source Information  Manuela Schwartz, NP  [facility]  Document History    Plan: Diflucan 200 mg weekly for 4 weeks Nystatin cream apply topically to area BID for 14 days Consider fungal cause of resp symptoms       Donnie Mesa NP [facility] Cross Cover 7pm-7am - check amion for availability Pager [phone]

## 2023-08-02 NOTE — Progress Notes (Signed)
PROGRESS NOTE    Cody Alexander  AOZ:[id number] DOB: [birth year] DOA: 07/23/2023 PCP: [facility]  [id number]-[id number]  LOS: 10 days   Brief hospital course:   Assessment & Plan: 67 year old male with known history of diastolic congestive heart failure and COPD who presents with progressive shortness of breath and mild bilateral lower extremity weakness.  Shortness of breath is slowly improving.  I suspect this is secondary to more decompensated COPD than CHF.  Mild pulmonary edema noted on chest imaging.  Trace lower extremity edema, BNP minimally elevated.   Will continue to treat for concomitant CHF and COPD.  Continue diuresis to maintain net euvolemia or mild net negative status.  IV steroids, bronchodilators.  Wean oxygen as tolerated.  Nocturnal CPAP.  Acute on chronic respiratory failure with hypoxia and hypercapnia (HCC) On 3-4L home O2 Likely secondary to both COPD and CHF exacerbation Weaned successfully off BiPAP, currently on 3L O2. Treatment pathways for COPD and CHF as below   Acute on chronic diastolic CHF (congestive heart failure) (HCC) Hypertensive emergency BP 161/110 on arrival Chest x-ray showing pulmonary vascular congestion Plan: --cont home aldactone and torsemide --cont Toprol --hold Lisinopril due to hypotension   COPD with acute exacerbation (HCC) Severe cough The primary driver for patient's symptoms.  RVP and COVID-negative. Refractory to therapy thus far --received 7 days of solumedrol, transitioned to prednisone Plan: --cont prednisone 40 mg daily --Tussionex q6h, per pt strong preference --Duo TID - Muco/Brov/Metaneb BID with Racepi after Metaneb.   Weakness 2/2 Hypotension --BP dropped to 80's on 12/4, likely due to BP medication and receiving Tussionex q4h scheduled and oxycodone --Hold Lisinopril  Elevated troponin CAD with history of stent Troponin elevated to 88, likely demand ischemia --cont ASA and statin --cont  Toprol   Uncontrolled type 2 diabetes mellitus with hyperglycemia, with long-term current use of insulin (HCC) --cont glargine 28u daily --mealtime 10u TID --ACHS and SSI   OSA (obstructive sleep apnea) BiPAP PRN   Obesity, Class III, BMI 40-49.9 (morbid obesity) (HCC) Complicating factor to overall prognosis and care   Small toe fracture --xray showed "Subtle nondisplaced acute transverse fracture of the proximal metaphysis of the proximal phalanx small toe." --taper down oxycodone   DVT prophylaxis: Lovenox SQ Code Status: Full code  Family Communication:  Level of care: Progressive Dispo:   The patient is from: home Anticipated d/c is to: home Anticipated d/c date is: 1-2 days   Subjective and Interval History:  Per RT, barking cough sounds like coupe, rec Racepi, which did help.   Objective: Vitals:   08/02/23 1012 08/02/23 1222 08/02/23 1351 08/02/23 1704  BP:  99/68  109/80  Pulse: 71 64 65 70  Resp: 18 20 18 15   Temp:  97.6 F (36.4 C)  98.3 F (36.8 C)  TempSrc:      SpO2: 90% 93% 93% 95%  Weight:      Height:        Intake/Output Summary (Last 24 hours) at 08/02/2023 1832 Last data filed at 08/02/2023 1708 Gross per 24 hour  Intake 600 ml  Output 475 ml  Net 125 ml   Filed Weights   07/31/23 0324 08/01/23 0500 08/02/23 0500  Weight: 132.6 kg 131.6 kg 130.1 kg    Examination:   Constitutional: NAD, AAOx3 HEENT: conjunctivae and lids normal, EOMI CV: No cyanosis.   RESP: normal respiratory effort Neuro: II - XII grossly intact.   Psych: Normal mood and affect.  Appropriate judgement and reason  Data Reviewed: I have personally reviewed labs and imaging studies  Time spent: 35 minutes  [name], MD [facility] If 7PM-7AM, please contact night-coverage 08/02/2023, 6:32 PM

## 2023-08-03 DIAGNOSIS — J441 Chronic obstructive pulmonary disease with (acute) exacerbation: Secondary | ICD-10-CM | POA: Diagnosis not present

## 2023-08-03 LAB — GLUCOSE, CAPILLARY
Glucose-Capillary: 137 mg/dL — ABNORMAL HIGH (ref 70–99)
Glucose-Capillary: 180 mg/dL — ABNORMAL HIGH (ref 70–99)

## 2023-08-03 MED ORDER — GUAIFENESIN ER 600 MG PO TB12: 1200.0000 mg | ORAL_TABLET | Freq: Two times a day (BID) | ORAL | 0 refills | Status: AC

## 2023-08-03 MED ORDER — FLUCONAZOLE 200 MG PO TABS: 200.0000 mg | ORAL_TABLET | ORAL | 0 refills | Status: DC

## 2023-08-03 MED ORDER — PREDNISONE 20 MG PO TABS: ORAL_TABLET | ORAL | 0 refills | Status: DC

## 2023-08-03 MED ORDER — DAPAGLIFLOZIN PROPANEDIOL 10 MG PO TABS: 10.0000 mg | ORAL_TABLET | Freq: Every day | ORAL | 2 refills | Status: AC

## 2023-08-03 MED ORDER — HYDROCOD POLI-CHLORPHE POLI ER 10-8 MG/5ML PO SUER: 5.0000 mL | Freq: Four times a day (QID) | ORAL | 0 refills | Status: AC | PRN

## 2023-08-03 MED ORDER — HYDROCORTISONE 1 % EX CREA: TOPICAL_CREAM | Freq: Four times a day (QID) | CUTANEOUS | Status: DC

## 2023-08-03 NOTE — TOC Transition Note (Signed)
Transition of Care [facility]) - CM/SW Discharge Note   Patient Details  Name: Cody Alexander MRN: [id number] Date of Birth: [birth year]  [facility] CM/SW Contact:  Bing Quarry, RN Phone Number: 08/03/2023, 11:51 AM   Clinical Narrative: 08/03/23: DC orders for today. Active with [facility] PTA for PT/OT/RN/Aide. Requested resumption HH  orders on discharge from provider via important message on secure chat. Notified [facility] of discharge  Discharge on rush due to need for taxi service that will stop at noon.  Cheyenne Adas with pick up on medical mall entrance, waiver signed, voucher printed to unit RN and Licensed conveyancer also notified. Patient notified as he had no other way home today.   Transportation, Food, Housing, Utility resources were placed in AVS via Care Coordination previously.   Information on [facility] transportation resources also added to AVS by this RN CM, but none available on Sunday.   Patient discharging and transporting to home address listed in chart, which is in [facility].  [address]  [place] [address]   Follow up appointments made or to be made by patient.  Heart Failure Clinic in 14 days.  Outpatient Pulmonologist follow in 2 weeks. 12/23 at 1100 am. Heart and Vascular TOC with [facility] [address] [place] [address] [place], [place] [address]. Phone [phone].  Follow up with PCP in 1 week at [facility] [phone]  Taxi arrived at 1203 pm. Notified Unit secretary by direct call.   Gabriel Cirri MSN RN CM  Care Management Department.  [place]  [facility] Direct Dial: [phone] Main Office Phone: [phone] Weekends Only    Final next level of care: Home w Home Health Services    Patient Goals and CMS Choice   Choice offered to / list presented to : NA  Discharge Placement                         Discharge Plan and Services Additional resources added to the After Visit Summary  for       Post Acute Care Choice: Resumption of Svcs/PTA Provider          DME Arranged: N/A DME Agency: NA       HH Arranged: PT, OT, Nurse's Aide, RN HH Agency: [facility] Date HH Agency Contacted: 08/03/23 Time HH Agency Contacted: 1151 Representative spoke with at [facility] Agency: Laurelyn Sickle  Social Determinants of Health (SDOH) Interventions SDOH Screenings   Food Insecurity: No Food Insecurity (07/23/2023)  Housing: Medium Risk (07/23/2023)  Transportation Needs: Unmet Transportation Needs (07/23/2023)  Utilities: Not At Risk (07/23/2023)  Alcohol Screen: Low Risk  (07/23/2023)  Financial Resource Strain: Low Risk  (07/23/2023)  Physical Activity: Inactive (07/02/2021)   Received from [facility], [facility]  Social Connections: Moderately Isolated (10/21/2022)   Received from [facility], [facility]  Stress: Stress Concern Present (10/21/2022)   Received from [facility], [facility]  Tobacco Use: Medium Risk (07/23/2023)  Health Literacy: Medium Risk (10/21/2022)   Received from [facility], [facility]     Readmission Risk Interventions    07/23/2023    1:58 PM 06/25/2022   10:13 AM  Readmission Risk Prevention Plan  Transportation Screening Complete Complete  PCP or Specialist Appt within 3-5 Days Complete Complete  HRI or Home Care Consult Complete Complete  Social Work Consult for Recovery Care Planning/Counseling Complete Complete  Palliative  Care Screening Not Applicable Not Applicable  Medication Review (RN Care Manager) Complete Complete

## 2023-08-03 NOTE — Plan of Care (Signed)
Progressing towards goals

## 2023-08-03 NOTE — Discharge Summary (Signed)
Physician Discharge Summary   Cody Alexander  male DOB: May 31, 1956  ZOX:[id number]  PCP: [facility]  Admit date: 07/23/2023 Discharge date: 08/03/2023  Admitted From: home Disposition:  home Home Health: Yes CODE STATUS: Full code  Discharge Instructions     Diet - low sodium heart healthy   Complete by: As directed    Discharge instructions   Complete by: As directed    Cardiologist added Farxiga for your heart.  Please finish prednisone taper.  Tussionex prescribed for your cough.  If cough is persistent, please see your outpatient pulmonologist.  The rash on your back looks like ringworm.  Please take fluconazole every Sat for 4 more weeks. [facility] Course:  For full details, please see H&P, progress notes, consult notes and ancillary notes.  Briefly,  Cody Alexander is a 67 year old male with known history of diastolic congestive heart failure and COPD on 3-4 L O2 who presented with progressive shortness of breath and mild bilateral lower extremity weakness.     Acute on chronic respiratory failure with hypoxia and hypercapnia (HCC) On 3-4L home O2 Likely secondary to both COPD and CHF exacerbation Weaned successfully off BiPAP, discharged on home 3L O2.   COPD with acute exacerbation (HCC) Severe cough Pt's main symptoms was persistent cough and inability to bring up sputum.  RVP and COVID-negative. --received 7 days of solumedrol, transitioned to prednisone.  Pt stayed many extra days to attempt to help clear mucus and control cough, and pt was on Duo TID - Muco/Brov/Metaneb BID with Racepi after Metaneb prior to discharge.  Cough somewhat improved but pt still required large amount of Tussionex to make his cough manageable.  Pt said with his previous hx, it took several weeks for the cough to subside.  Pt was therefore discharged to recover at home.  Pt was discharged on prednisone taper, Mucinex and Tussionex. --f/u with  outpatient pulm Dr. Welton Flakes.  Acute on chronic diastolic CHF (congestive heart failure) (HCC) Chest x-ray showing pulmonary vascular congestion.  Cardio consulted.  Pt received IV lasix 60 x1 and IV lasix 40 x2 then transitioned to home aldactone and torsemide. --cont Toprol --Lisinopril d/c'ed due to hypotension. --cardio added Farxiga  Hypertensive emergency, ruled out BP 161/110 on arrival.  Did not meet criteria.  Weakness 2/2 Hypotension --BP dropped to 80's on 12/4, likely due to BP medication and receiving Tussionex q4h scheduled and oxycodone --Lisinopril d/c'ed   Elevated troponin CAD with history of stent Troponin elevated to 88, likely demand ischemia --cont ASA and statin --cont Toprol   Uncontrolled type 2 diabetes mellitus with hyperglycemia, with long-term current use of insulin (HCC) --discharged back on home regimen as below.   OSA (obstructive sleep apnea)   Obesity, Class III, BMI 40-49.9 (morbid obesity) (HCC) Complicating factor to overall prognosis and care   Small toe fracture --xray showed "Subtle nondisplaced acute transverse fracture of the proximal metaphysis of the proximal phalanx small toe." --received PRN oxycodone during hospitalization.  Pain improved prior to discharge.  Ringworm --extensive rash over pt's back, resembles ringworm. --start fluconazole 200 mg weekly for 5 weeks total.   Discharge Diagnoses:  Principal Problem:   CHF exacerbation (HCC) Active Problems:   Acute on chronic respiratory failure with hypoxia and hypercapnia (HCC)   Acute on chronic diastolic CHF (congestive heart failure) (HCC)   Hypertensive emergency   COPD with acute exacerbation (HCC)   Elevated troponin   CAD (coronary artery disease)  Uncontrolled type 2 diabetes mellitus with hyperglycemia, with long-term current use of insulin (HCC)   Obesity, Class III, BMI 40-49.9 (morbid obesity) (HCC)   OSA (obstructive sleep apnea)   30 Day Unplanned  Readmission Risk Score    Flowsheet Row ED to Hosp-Admission (Current) from 07/23/2023 in [facility]  30 Day Unplanned Readmission Risk Score (%) 24.93 Filed at 08/03/2023 0801       This score is the patient's risk of an unplanned readmission within 30 days of being discharged (0 -100%). The score is based on dignosis, age, lab data, medications, orders, and past utilization.   Low:  0-14.9   Medium: 15-21.9   High: 22-29.9   Extreme: 30 and above         Discharge Instructions:  Allergies as of 08/03/2023       Reactions   Erythromycin Anaphylaxis, Swelling   Had eye swelling with erythromycin during a time when he had perf ear drum. Tolerated azithromycin.        Medication List     STOP taking these medications    lisinopril 20 MG tablet Commonly known as: ZESTRIL   losartan 25 MG tablet Commonly known as: COZAAR   losartan 50 MG tablet Commonly known as: COZAAR   magnesium oxide 400 MG tablet Commonly known as: MAG-OX   metFORMIN 1000 MG tablet Commonly known as: GLUCOPHAGE   mupirocin ointment 2 % Commonly known as: BACTROBAN   Oxycodone HCl 10 MG Tabs   Ozempic (0.25 or 0.5 MG/DOSE) 2 MG/3ML Sopn Generic drug: Semaglutide(0.25 or 0.5MG /DOS)       TAKE these medications    albuterol 108 (90 Base) MCG/ACT inhaler Commonly known as: Proventil HFA Inhale 2 puffs into the lungs every 4 (four) hours as needed.   aspirin EC 81 MG EC tablet Generic drug: aspirin EC Take 81 mg by mouth daily.   atorvastatin 80 MG tablet Commonly known as: LIPITOR Take 1 tablet by mouth daily. What changed: Another medication with the same name was removed. Continue taking this medication, and follow the directions you see here.   chlorpheniramine-HYDROcodone 10-8 MG/5ML Commonly known as: TUSSIONEX Take 5 mLs by mouth every 6 (six) hours as needed for up to 14 days for cough.   dapagliflozin propanediol 10 MG Tabs tablet Commonly known  as: FARXIGA Take 1 tablet (10 mg total) by mouth daily. Start taking on: August 04, 2023   fluconazole 200 MG tablet Commonly known as: DIFLUCAN Take 1 tablet (200 mg total) by mouth once a week. On Saturdays. Start taking on: August 09, 2023   guaiFENesin 600 MG 12 hr tablet Commonly known as: MUCINEX Take 2 tablets (1,200 mg total) by mouth 2 (two) times daily for 14 days.   hydrocortisone cream 1 % Apply topically 4 (four) times daily.   hydrOXYzine 25 MG tablet Commonly known as: ATARAX Take by mouth every 6 (six) hours as needed for itching.   insulin lispro 100 UNIT/ML KwikPen Commonly known as: HUMALOG Inject 10 Units into the skin 3 (three) times daily with meals.   ipratropium-albuterol 0.5-2.5 (3) MG/3ML Soln Commonly known as: DUONEB Inhale 3 mLs by nebulization once every 6 (six) hours as needed.   Jardiance 25 MG Tabs tablet Generic drug: empagliflozin Take 25 mg by mouth daily.   Lantus SoloStar 100 UNIT/ML Solostar Pen Generic drug: insulin glargine Inject 20 Units into the skin daily.   metoprolol succinate 25 MG 24 hr tablet Commonly known as: TOPROL-XL  Take 1 tablet (25 mg total) by mouth daily.   Omeprazole 20 MG Tbec Take 1 tablet by mouth every morning.   predniSONE 20 MG tablet Commonly known as: DELTASONE Take 20 mg (1 tab) from 12/9 to 12/12, then 10 mg (1/2 tab) from 12/13 to 12/16. Start taking on: August 04, 2023   spironolactone 25 MG tablet Commonly known as: ALDACTONE Take 1 tablet by mouth daily.   Symbicort 160-4.5 MCG/ACT inhaler Generic drug: budesonide-formoterol Inhale 2 puffs into the lungs 2 (two) times daily.   torsemide 20 MG tablet Commonly known as: DEMADEX Take 2 tablets (40 mg total) by mouth daily.         Follow-up Information     [facility] Follow up in 14 day(s).   Specialty: Cardiology Why: Hospital Follow-Up Please bring all medications with you to the  appointment. [facility], [address], Second Floor Free Valet Parking [employer] information: [address] [place] [address] [phone]        [facility] Follow up in 1 week(s).   Contact information: [address] [place] [address] [phone]         Your outpatient pulmnologist Follow up in 2 week(s).                  Allergies  Allergen Reactions   Erythromycin Anaphylaxis and Swelling    Had eye swelling with erythromycin during a time when he had perf ear drum.  Tolerated azithromycin.     The results of significant diagnostics from this hospitalization (including imaging, microbiology, ancillary and laboratory) are listed below for reference.   Consultations:   Procedures/Studies: DG Foot Complete Right  Result Date: 07/28/2023 CLINICAL DATA:  Foot caught in bed railing. Erythema and pain, reduced range of motion. EXAM: RIGHT FOOT COMPLETE - 3+ VIEW COMPARISON:  None Available. FINDINGS: Degenerative subcortical cysts or erosions medially in the first metatarsal head. Gout arthropathy not excluded. Small accessory navicular and os peroneus. Subtle nondisplaced acute fracture of the proximal metaphysis of the proximal phalanx small toe. IMPRESSION: 1. Subtle nondisplaced acute transverse fracture of the proximal metaphysis of the proximal phalanx small toe. 2. Degenerative subcortical cysts or erosions medially in the first metatarsal head. Gout arthropathy not excluded. Electronically Signed   By: Gaylyn Rong M.D.   On: 07/28/2023 11:57   DG Chest Port 1 View  Result Date: 07/23/2023 CLINICAL DATA:  Possible sepsis respiratory distress EXAM: PORTABLE CHEST 1 VIEW COMPARISON:  06/17/2023 FINDINGS: Cardiomegaly with mild central congestion. No acute airspace disease or effusion. No pneumothorax. Mild atherosclerosis IMPRESSION: Cardiomegaly with mild central congestion.  Electronically Signed   By: Jasmine Pang M.D.   On: 07/23/2023 01:21      Labs: BNP (last 3 results) Recent Labs    01/07/23 2256 06/12/23 1122 07/23/23 0025  BNP 59.7 126.2* 208.8*   Basic Metabolic Panel: Recent Labs  Lab 07/28/23 0939 07/30/23 0427 07/31/23 0408 08/02/23 0621  NA 134* 134* 135 136  K 3.9 4.5 5.0 4.9  CL 90* 92* 93* 89*  CO2 35* 33* 34* 37*  GLUCOSE 276* 221* 149* 171*  BUN 48* 47* 40* 42*  CREATININE 1.56* 1.18 1.12 1.26*  CALCIUM 8.5* 8.3* 8.6* 9.3  MG  --   --  2.8*  --    Liver Function Tests: No results for input(s): "AST", "ALT", "ALKPHOS", "BILITOT", "PROT", "ALBUMIN" in the last 168  hours. No results for input(s): "LIPASE", "AMYLASE" in the last 168 hours. No results for input(s): "AMMONIA" in the last 168 hours. CBC: Recent Labs  Lab 07/28/23 0939 07/31/23 0408  WBC 11.3* 10.3  NEUTROABS 7.7  --   HGB 14.6 14.5  HCT 47.0 46.5  MCV 92.5 91.0  PLT 321 265   Cardiac Enzymes: No results for input(s): "CKTOTAL", "CKMB", "CKMBINDEX", "TROPONINI" in the last 168 hours. BNP: Invalid input(s): "POCBNP" CBG: Recent Labs  Lab 08/02/23 1218 08/02/23 1659 08/02/23 2113 08/02/23 2343 08/03/23 0818  GLUCAP 146* 235* 257* 236* 137*   D-Dimer No results for input(s): "DDIMER" in the last 72 hours. Hgb A1c No results for input(s): "HGBA1C" in the last 72 hours. Lipid Profile No results for input(s): "CHOL", "HDL", "LDLCALC", "TRIG", "CHOLHDL", "LDLDIRECT" in the last 72 hours. Thyroid function studies No results for input(s): "TSH", "T4TOTAL", "T3FREE", "THYROIDAB" in the last 72 hours.  Invalid input(s): "FREET3" Anemia work up No results for input(s): "VITAMINB12", "FOLATE", "FERRITIN", "TIBC", "IRON", "RETICCTPCT" in the last 72 hours. Urinalysis    Component Value Date/Time   COLORURINE AMBER (A) 01/05/2023 1652   APPEARANCEUR CLOUDY (A) 01/05/2023 1652   APPEARANCEUR Clear 08/20/2013 0849   LABSPEC 1.020 01/05/2023 1652    LABSPEC 1.014 08/20/2013 0849   PHURINE 5.5 01/05/2023 1652   GLUCOSEU 500 (A) 01/05/2023 1652   GLUCOSEU Negative 08/20/2013 0849   HGBUR LARGE (A) 01/05/2023 1652   BILIRUBINUR NEGATIVE 01/05/2023 1652   BILIRUBINUR Negative 08/20/2013 0849   KETONESUR TRACE (A) 01/05/2023 1652   PROTEINUR >300 (A) 01/05/2023 1652   NITRITE NEGATIVE 01/05/2023 1652   LEUKOCYTESUR MODERATE (A) 01/05/2023 1652   LEUKOCYTESUR Negative 08/20/2013 0849   Sepsis Labs Recent Labs  Lab 07/28/23 0939 07/31/23 0408  WBC 11.3* 10.3   Microbiology No results found for this or any previous visit (from the past 240 hour(s)).   Total time spend on discharging this patient, including the last patient exam, discussing the hospital stay, instructions for ongoing care as it relates to all pertinent caregivers, as well as preparing the medical discharge records, prescriptions, and/or referrals as applicable, is 35 minutes.    [name], MD  [facility] 08/03/2023, 11:05 AM

## 2023-08-06 NOTE — Progress Notes (Unsigned)
Advanced Heart Failure Clinic Note   Referring Physician: PCP: [facility] Cardiologist: None   HPI:  [name] is a 67 y/o male with a history of COPD, CAD s/p PCI (35% mid LAD 2019), chronically elevated troponin, HTN, T2DM, OSA with CPAP, substance use, obesity and chronic heart failure.   Admitted 07/03/23 due to COPD exacerbation due to rhinovirus. Given duonebs and prednisone. IV lasix given due to pedal edema. CXR negative. Medication compliance is inconsistent.    Admitted 07/23/23 due to progressive shortness of breath and mild bilateral lower extremity weakness. BP 161/110 on arrival, afebrile, pulse 95 and O2 sat 96% on CPAP. He was transitioned to BiPAP on arrival. Chest x-ray with cardiomegaly with mild central congestion. Able to be weaned off of bipap to baseline oxygen. Given IV solumedrol with transition to prednisone. Given IV lasix for HF exacerbation. Troponin elevated to 88, likely demand ischemia.    Echo 11/11/22: EF 65-70% with moderate LVH, grade I DD, mild/ moderate LAE Echo 01/07/23: EF 65-70% with severe LVH, Grade II DD, mild dilatation of aortic root at 42 mm Echo 02/14/23: EF 60-65% with Grade I DD, mild MR  He presents today for his initial HF clinic visit with a chief complaint of    Previous cardiac studies:  LHC 02/27/18: Prox LAD 35% stenosed. Mild CAD with mild mid LAD lesion and normal left circumflex and RCA with normal left ventricular systolic function   Echo 01/21/17: EF 35% with Grade II DD, severe LVH Echo 02/25/18: EF 60% with mild MR/TR Echo 01/04/21: EF 60-65% with moderate LVH, Grade I DD, mild LAE/RAE  Echo 06/20/22: EF 70-75% Echo 07/23/22: EF 65% with Grade I DD   Review of Systems: [y] = yes, [ ]  = no   General: Weight gain [ ] ; Weight loss [ ] ; Anorexia [ ] ; Fatigue [ ] ; Fever [ ] ; Chills [ ] ; Weakness [ ]   Cardiac: Chest pain/pressure [ ] ; Resting SOB [ ] ; Exertional SOB [ ] ; Orthopnea [ ] ; Pedal Edema [ ] ;  Palpitations [ ] ; Syncope [ ] ; Presyncope [ ] ; Paroxysmal nocturnal dyspnea[ ]   Pulmonary: Cough [ ] ; Wheezing[ ] ; Hemoptysis[ ] ; Sputum [ ] ; Snoring [ ]   GI: Vomiting[ ] ; Dysphagia[ ] ; Melena[ ] ; Hematochezia [ ] ; Heartburn[ ] ; Abdominal pain [ ] ; Constipation [ ] ; Diarrhea [ ] ; BRBPR [ ]   GU: Hematuria[ ] ; Dysuria [ ] ; Nocturia[ ]   Vascular: Pain in legs with walking [ ] ; Pain in feet with lying flat [ ] ; Non-healing sores [ ] ; Stroke [ ] ; TIA [ ] ; Slurred speech [ ] ;  Neuro: Headaches[ ] ; Vertigo[ ] ; Seizures[ ] ; Paresthesias[ ] ;Blurred vision [ ] ; Diplopia [ ] ; Vision changes [ ]   Ortho/Skin: Arthritis [ ] ; Joint pain [ ] ; Muscle pain [ ] ; Joint swelling [ ] ; Back Pain [ ] ; Rash [ ]   Psych: Depression[ ] ; Anxiety[ ]   Heme: Bleeding problems [ ] ; Clotting disorders [ ] ; Anemia [ ]   Endocrine: Diabetes [ ] ; Thyroid dysfunction[ ]    Past Medical History:  Diagnosis Date   CHF (congestive heart failure) (HCC)    COPD (chronic obstructive pulmonary disease) (HCC)    Diabetes mellitus without complication (HCC)    Hypertension    Neuropathy     Current Outpatient Medications  Medication Sig Dispense Refill   albuterol (PROVENTIL HFA) 108 (90 Base) MCG/ACT inhaler Inhale 2 puffs into the lungs every 4 (four) hours as needed. 6.7 g 0   ASPIRIN EC 81 MG  EC tablet Take 81 mg by mouth daily.     atorvastatin (LIPITOR) 80 MG tablet Take 1 tablet by mouth daily.     budesonide-formoterol (SYMBICORT) 160-4.5 MCG/ACT inhaler Inhale 2 puffs into the lungs 2 (two) times daily. 10.2 g 2   chlorpheniramine-HYDROcodone (TUSSIONEX) 10-8 MG/5ML Take 5 mLs by mouth every 6 (six) hours as needed for up to 14 days for cough. 280 mL 0   dapagliflozin propanediol (FARXIGA) 10 MG TABS tablet Take 1 tablet (10 mg total) by mouth daily. 30 tablet 2   [START ON 08/09/2023] fluconazole (DIFLUCAN) 200 MG tablet Take 1 tablet (200 mg total) by mouth once a week. On Saturdays. 4 tablet 0   guaiFENesin (MUCINEX) 600  MG 12 hr tablet Take 2 tablets (1,200 mg total) by mouth 2 (two) times daily for 14 days. 56 tablet 0   hydrocortisone cream 1 % Apply topically 4 (four) times daily.     hydrOXYzine (ATARAX) 25 MG tablet Take by mouth every 6 (six) hours as needed for itching.     insulin lispro (HUMALOG) 100 UNIT/ML KwikPen Inject 10 Units into the skin 3 (three) times daily with meals. 27 mL 0   ipratropium-albuterol (DUONEB) 0.5-2.5 (3) MG/3ML SOLN Inhale 3 mLs by nebulization once every 6 (six) hours as needed. 120 mL 1   JARDIANCE 25 MG TABS tablet Take 25 mg by mouth daily.     LANTUS SOLOSTAR 100 UNIT/ML Solostar Pen Inject 20 Units into the skin daily. 18 mL 0   metoprolol succinate (TOPROL-XL) 25 MG 24 hr tablet Take 1 tablet (25 mg total) by mouth daily. 90 tablet 0   Omeprazole 20 MG TBEC Take 1 tablet by mouth every morning.     predniSONE (DELTASONE) 20 MG tablet Take 20 mg (1 tab) from 12/9 to 12/12, then 10 mg (1/2 tab) from 12/13 to 12/16. 6 tablet 0   torsemide (DEMADEX) 20 MG tablet Take 2 tablets (40 mg total) by mouth daily. 60 tablet 2   No current facility-administered medications for this visit.    Allergies  Allergen Reactions   Erythromycin Anaphylaxis and Swelling    Had eye swelling with erythromycin during a time when he had perf ear drum.  Tolerated azithromycin.      Social History   Socioeconomic History   Marital status: Single    Spouse name: Not on file   Number of children: 1   Years of education: Not on file   Highest education level: 12th grade  Occupational History   Occupation: Retired  Tobacco Use   Smoking status: Former    Types: Cigarettes   Smokeless tobacco: Never  Vaping Use   Vaping status: Never Used  Substance and Sexual Activity   Alcohol use: No   Drug use: Never   Sexual activity: Not on file  Other Topics Concern   Not on file  Social History Narrative   Not on file   Social Determinants of Health   Financial Resource Strain: Low  Risk  (07/23/2023)   Overall Financial Resource Strain (CARDIA)    Difficulty of Paying Living Expenses: Not very hard  Food Insecurity: No Food Insecurity (07/23/2023)   Hunger Vital Sign    Worried About Running Out of Food in the Last Year: Never true    Ran Out of Food in the Last Year: Never true  Transportation Needs: Unmet Transportation Needs (07/23/2023)   PRAPARE - Administrator, Civil Service (Medical): Yes  Lack of Transportation (Non-Medical): No  Physical Activity: Inactive (07/02/2021)   Received from [facility], [facility]   Exercise Vital Sign    Days of Exercise per Week: 0 days    Minutes of Exercise per Session: 0 min  Stress: Stress Concern Present (10/21/2022)   Received from [facility], [facility] of Occupational Health - Occupational Stress Questionnaire    Feeling of Stress : To some extent  Social Connections: Moderately Isolated (10/21/2022)   Received from [facility], [facility]   Social Connection and Isolation Panel [NHANES]    Frequency of Communication with Friends and Family: More than three times a week    Frequency of Social Gatherings with Friends and Family: More than three times a week    Attends Religious Services: 1 to 4 times per year    Active Member of [employer] or Organizations: No    Attends Banker Meetings: Never    Marital Status: Divorced  Catering manager Violence: Not At Risk (07/23/2023)   Humiliation, Afraid, Rape, and Kick questionnaire    Fear of Current or Ex-Partner: No    Emotionally Abused: No    Physically Abused: No    Sexually Abused: No      Family History  Problem Relation Age of Onset   Stroke Mother    Hypertension Father        PHYSICAL EXAM: General:  Well appearing. No respiratory difficulty HEENT: normal Neck: supple. no JVD. No lymphadenopathy or thyromegaly appreciated. Cor: PMI nondisplaced. Regular rate & rhythm. No rubs,  gallops or murmurs. Lungs: clear Abdomen: soft, nontender, nondistended. No hepatosplenomegaly. No bruits or masses.  Extremities: no cyanosis, clubbing, rash, edema Neuro: alert & oriented x 3, cranial nerves grossly intact. moves all 4 extremities w/o difficulty. Affect pleasant.  ECG:   ASSESSMENT & PLAN:  1: Chronic heart failure with preserved ejection fraction- - suspect due to - NYHA class - euvolemic - weighing daily - Echo 11/11/22: EF 65-70% with moderate LVH, grade I DD, mild/ moderate LAE - Echo 01/07/23: EF 65-70% with severe LVH, Grade II DD, mild dilatation of aortic root at 42 mm - Echo 02/14/23: EF 60-65% with Grade I DD, mild MR - continue  - BNP 07/23/23 was 208.8  2: HTN- - BP - saw PCP - BMP 08/02/23 showed sodium 136, potassium 4.9, creatinine 1.26 & GFR >60  3: CAD- - LHC 02/27/18: Prox LAD 35% stenosed. Mild CAD with mild mid LAD lesion and normal left circumflex and RCA with normal left ventricular systolic function  - s/p PCI to LAD  4: T2DM- - A1c 07/23/23 was 10.5%  5: OSA- - wearing CPAP   Delma Freeze, FNP 08/06/23

## 2023-08-06 NOTE — Progress Notes (Deleted)
.  hfop

## 2023-08-08 ENCOUNTER — Telehealth: Payer: Self-pay | Admitting: Family

## 2023-08-08 ENCOUNTER — Encounter: Payer: 59 | Admitting: Family

## 2023-08-08 NOTE — Telephone Encounter (Signed)
Patient did not show for his initial Heart Failure Clinic appointment on 08/08/23.

## 2023-09-28 ENCOUNTER — Other Ambulatory Visit: Payer: Self-pay

## 2023-09-28 ENCOUNTER — Emergency Department: Payer: 59

## 2023-09-28 ENCOUNTER — Inpatient Hospital Stay
Admission: EM | Admit: 2023-09-28 | Discharge: 2023-10-03 | DRG: 190 | Disposition: A | Discharge status: Home Health | Payer: 59 | Attending: Internal Medicine | Admitting: Internal Medicine

## 2023-09-28 DIAGNOSIS — Z7982 Long term (current) use of aspirin: Secondary | ICD-10-CM | POA: Diagnosis not present

## 2023-09-28 DIAGNOSIS — G4733 Obstructive sleep apnea (adult) (pediatric): Secondary | ICD-10-CM | POA: Diagnosis present

## 2023-09-28 DIAGNOSIS — M549 Dorsalgia, unspecified: Secondary | ICD-10-CM | POA: Diagnosis present

## 2023-09-28 DIAGNOSIS — N323 Diverticulum of bladder: Secondary | ICD-10-CM | POA: Diagnosis present

## 2023-09-28 DIAGNOSIS — Z794 Long term (current) use of insulin: Secondary | ICD-10-CM | POA: Diagnosis not present

## 2023-09-28 DIAGNOSIS — M7989 Other specified soft tissue disorders: Secondary | ICD-10-CM | POA: Diagnosis present

## 2023-09-28 DIAGNOSIS — I2489 Other forms of acute ischemic heart disease: Secondary | ICD-10-CM | POA: Diagnosis present

## 2023-09-28 DIAGNOSIS — J9621 Acute and chronic respiratory failure with hypoxia: Secondary | ICD-10-CM | POA: Diagnosis present

## 2023-09-28 DIAGNOSIS — Z7951 Long term (current) use of inhaled steroids: Secondary | ICD-10-CM

## 2023-09-28 DIAGNOSIS — I110 Hypertensive heart disease with heart failure: Secondary | ICD-10-CM | POA: Diagnosis present

## 2023-09-28 DIAGNOSIS — I5033 Acute on chronic diastolic (congestive) heart failure: Secondary | ICD-10-CM | POA: Diagnosis present

## 2023-09-28 DIAGNOSIS — I2510 Atherosclerotic heart disease of native coronary artery without angina pectoris: Secondary | ICD-10-CM

## 2023-09-28 DIAGNOSIS — J9622 Acute and chronic respiratory failure with hypercapnia: Secondary | ICD-10-CM | POA: Diagnosis present

## 2023-09-28 DIAGNOSIS — Z7984 Long term (current) use of oral hypoglycemic drugs: Secondary | ICD-10-CM | POA: Diagnosis not present

## 2023-09-28 DIAGNOSIS — Z8249 Family history of ischemic heart disease and other diseases of the circulatory system: Secondary | ICD-10-CM

## 2023-09-28 DIAGNOSIS — Z881 Allergy status to other antibiotic agents status: Secondary | ICD-10-CM

## 2023-09-28 DIAGNOSIS — J449 Chronic obstructive pulmonary disease, unspecified: Secondary | ICD-10-CM | POA: Diagnosis present

## 2023-09-28 DIAGNOSIS — M79604 Pain in right leg: Secondary | ICD-10-CM | POA: Diagnosis present

## 2023-09-28 DIAGNOSIS — Z955 Presence of coronary angioplasty implant and graft: Secondary | ICD-10-CM

## 2023-09-28 DIAGNOSIS — Z87891 Personal history of nicotine dependence: Secondary | ICD-10-CM

## 2023-09-28 DIAGNOSIS — G8929 Other chronic pain: Secondary | ICD-10-CM | POA: Diagnosis present

## 2023-09-28 DIAGNOSIS — Z79899 Other long term (current) drug therapy: Secondary | ICD-10-CM

## 2023-09-28 DIAGNOSIS — E1165 Type 2 diabetes mellitus with hyperglycemia: Secondary | ICD-10-CM | POA: Diagnosis present

## 2023-09-28 DIAGNOSIS — K920 Hematemesis: Secondary | ICD-10-CM | POA: Diagnosis present

## 2023-09-28 DIAGNOSIS — Z87892 Personal history of anaphylaxis: Secondary | ICD-10-CM

## 2023-09-28 DIAGNOSIS — J441 Chronic obstructive pulmonary disease with (acute) exacerbation: Principal | ICD-10-CM | POA: Diagnosis present

## 2023-09-28 DIAGNOSIS — Z6839 Body mass index (BMI) 39.0-39.9, adult: Secondary | ICD-10-CM

## 2023-09-28 DIAGNOSIS — M79605 Pain in left leg: Secondary | ICD-10-CM | POA: Diagnosis present

## 2023-09-28 DIAGNOSIS — N3091 Cystitis, unspecified with hematuria: Secondary | ICD-10-CM | POA: Diagnosis present

## 2023-09-28 DIAGNOSIS — R042 Hemoptysis: Secondary | ICD-10-CM | POA: Diagnosis present

## 2023-09-28 DIAGNOSIS — R531 Weakness: Secondary | ICD-10-CM | POA: Diagnosis present

## 2023-09-28 DIAGNOSIS — I5031 Acute diastolic (congestive) heart failure: Secondary | ICD-10-CM | POA: Diagnosis present

## 2023-09-28 DIAGNOSIS — R319 Hematuria, unspecified: Secondary | ICD-10-CM

## 2023-09-28 DIAGNOSIS — Z823 Family history of stroke: Secondary | ICD-10-CM

## 2023-09-28 DIAGNOSIS — R310 Gross hematuria: Secondary | ICD-10-CM | POA: Diagnosis not present

## 2023-09-28 DIAGNOSIS — Z9981 Dependence on supplemental oxygen: Secondary | ICD-10-CM

## 2023-09-28 DIAGNOSIS — E119 Type 2 diabetes mellitus without complications: Secondary | ICD-10-CM

## 2023-09-28 LAB — URINALYSIS, ROUTINE W REFLEX MICROSCOPIC
Bilirubin Urine: NEGATIVE
Glucose, UA: NEGATIVE mg/dL
Ketones, ur: NEGATIVE mg/dL
Nitrite: NEGATIVE
Protein, ur: 100 mg/dL — AB
Specific Gravity, Urine: 1.012 (ref 1.005–1.030)
WBC, UA: >50 WBC/hpf (ref 0–5)
pH: 6 (ref 5.0–8.0)

## 2023-09-28 LAB — BASIC METABOLIC PANEL
Anion gap: 11 (ref 5–15)
BUN: 14 mg/dL (ref 8–23)
CO2: 29 mmol/L (ref 22–32)
Calcium: 9.1 mg/dL (ref 8.9–10.3)
Chloride: 95 mmol/L — ABNORMAL LOW (ref 98–111)
Creatinine, Ser: 0.91 mg/dL (ref 0.61–1.24)
GFR, Estimated: >60 mL/min (ref >60)
Glucose, Bld: 156 mg/dL — ABNORMAL HIGH (ref 70–99)
Potassium: 4.4 mmol/L (ref 3.5–5.1)
Sodium: 135 mmol/L (ref 135–145)

## 2023-09-28 LAB — CBC
HCT: 47.5 % (ref 39.0–52.0)
Hemoglobin: 15.1 g/dL (ref 13.0–17.0)
MCH: 28.9 pg (ref 26.0–34.0)
MCHC: 31.8 g/dL (ref 30.0–36.0)
MCV: 91 fL (ref 80.0–100.0)
Platelets: 270 10*3/uL (ref 150–400)
RBC: 5.22 MIL/uL (ref 4.22–5.81)
RDW: 13.9 % (ref 11.5–15.5)
WBC: 17.1 10*3/uL — ABNORMAL HIGH (ref 4.0–10.5)
nRBC: 0 % (ref 0.0–0.2)

## 2023-09-28 LAB — LACTIC ACID, PLASMA: Lactic Acid, Venous: 0.9 mmol/L (ref 0.5–1.9)

## 2023-09-28 LAB — TROPONIN I (HIGH SENSITIVITY): Troponin I (High Sensitivity): 154 ng/L (ref <18)

## 2023-09-28 LAB — BRAIN NATRIURETIC PEPTIDE: B Natriuretic Peptide: 119 pg/mL — ABNORMAL HIGH (ref 0.0–100.0)

## 2023-09-28 MED ORDER — OXYCODONE HCL 5 MG PO TABS
5.0000 mg | ORAL_TABLET | ORAL | Status: DC | PRN
  Administered 2023-09-28 – 2023-10-03 (×19): 5 mg via ORAL
  Filled 2023-09-28 (×19): qty 1

## 2023-09-28 MED ORDER — METHYLPREDNISOLONE SODIUM SUCC 125 MG IJ SOLR
125.0000 mg | Freq: Once | INTRAMUSCULAR | Status: AC
  Administered 2023-09-28: 125 mg via INTRAVENOUS
  Filled 2023-09-28: qty 2

## 2023-09-28 MED ORDER — INSULIN GLARGINE-YFGN 100 UNIT/ML ~~LOC~~ SOLN: 20.0000 [IU] | Freq: Every day | SUBCUTANEOUS | Status: DC

## 2023-09-28 MED ORDER — METHYLPREDNISOLONE SODIUM SUCC 40 MG IJ SOLR
40.0000 mg | Freq: Two times a day (BID) | INTRAMUSCULAR | Status: AC
  Administered 2023-09-29 (×2): 40 mg via INTRAVENOUS
  Filled 2023-09-28 (×2): qty 1

## 2023-09-28 MED ORDER — TORSEMIDE 20 MG PO TABS: 40.0000 mg | ORAL_TABLET | Freq: Every day | ORAL | Status: DC

## 2023-09-28 MED ORDER — ACETAMINOPHEN 650 MG RE SUPP
650.0000 mg | Freq: Four times a day (QID) | RECTAL | Status: DC | PRN
Start: 2023-09-28 — End: 2023-10-03

## 2023-09-28 MED ORDER — ENOXAPARIN SODIUM 80 MG/0.8ML IJ SOSY
0.5000 mg/kg | PREFILLED_SYRINGE | INTRAMUSCULAR | Status: DC
  Administered 2023-09-29 – 2023-10-02 (×5): 67.5 mg via SUBCUTANEOUS
  Filled 2023-09-28 (×6): qty 0.68

## 2023-09-28 MED ORDER — ACETAMINOPHEN 500 MG PO TABS
1000.0000 mg | ORAL_TABLET | Freq: Once | ORAL | Status: DC
  Filled 2023-09-28: qty 2

## 2023-09-28 MED ORDER — GUAIFENESIN ER 600 MG PO TB12
600.0000 mg | ORAL_TABLET | Freq: Two times a day (BID) | ORAL | Status: DC
  Administered 2023-09-28 – 2023-10-03 (×10): 600 mg via ORAL
  Filled 2023-09-28 (×12): qty 1

## 2023-09-28 MED ORDER — ALBUTEROL SULFATE (2.5 MG/3ML) 0.083% IN NEBU
2.5000 mg | INHALATION_SOLUTION | RESPIRATORY_TRACT | Status: DC | PRN
  Administered 2023-09-30: 2.5 mg via RESPIRATORY_TRACT
  Filled 2023-09-28: qty 3

## 2023-09-28 MED ORDER — IPRATROPIUM-ALBUTEROL 0.5-2.5 (3) MG/3ML IN SOLN
3.0000 mL | Freq: Four times a day (QID) | RESPIRATORY_TRACT | Status: DC
  Administered 2023-09-29 – 2023-10-03 (×16): 3 mL via RESPIRATORY_TRACT
  Filled 2023-09-28 (×18): qty 3

## 2023-09-28 MED ORDER — IPRATROPIUM-ALBUTEROL 0.5-2.5 (3) MG/3ML IN SOLN
3.0000 mL | Freq: Once | RESPIRATORY_TRACT | Status: AC
  Administered 2023-09-28: 3 mL via RESPIRATORY_TRACT
  Filled 2023-09-28: qty 3

## 2023-09-28 MED ORDER — ATORVASTATIN CALCIUM 80 MG PO TABS
80.0000 mg | ORAL_TABLET | Freq: Every day | ORAL | Status: DC
  Administered 2023-09-29 – 2023-10-03 (×5): 80 mg via ORAL
  Filled 2023-09-28: qty 1
  Filled 2023-09-28: qty 4
  Filled 2023-09-28 (×3): qty 1

## 2023-09-28 MED ORDER — ENOXAPARIN SODIUM 40 MG/0.4ML IJ SOSY: 40.0000 mg | PREFILLED_SYRINGE | INTRAMUSCULAR | Status: DC

## 2023-09-28 MED ORDER — PREDNISONE 20 MG PO TABS
40.0000 mg | ORAL_TABLET | Freq: Every day | ORAL | Status: AC
  Administered 2023-09-30 – 2023-10-03 (×4): 40 mg via ORAL
  Filled 2023-09-28 (×4): qty 2

## 2023-09-28 MED ORDER — INSULIN ASPART 100 UNIT/ML IJ SOLN
0.0000 [IU] | Freq: Three times a day (TID) | INTRAMUSCULAR | Status: DC
  Administered 2023-09-29 (×2): 15 [IU] via SUBCUTANEOUS
  Administered 2023-09-29: 5 [IU] via SUBCUTANEOUS
  Administered 2023-09-30: 3 [IU] via SUBCUTANEOUS
  Administered 2023-09-30: 11 [IU] via SUBCUTANEOUS
  Administered 2023-09-30: 8 [IU] via SUBCUTANEOUS
  Administered 2023-10-01 (×2): 5 [IU] via SUBCUTANEOUS
  Administered 2023-10-02: 3 [IU] via SUBCUTANEOUS
  Administered 2023-10-02: 11 [IU] via SUBCUTANEOUS
  Administered 2023-10-03: 5 [IU] via SUBCUTANEOUS
  Administered 2023-10-03: 2 [IU] via SUBCUTANEOUS
  Filled 2023-09-28 (×12): qty 1

## 2023-09-28 MED ORDER — INSULIN ASPART 100 UNIT/ML IJ SOLN
0.0000 [IU] | Freq: Every day | INTRAMUSCULAR | Status: DC
  Administered 2023-09-29: 3 [IU] via SUBCUTANEOUS
  Administered 2023-10-02: 2 [IU] via SUBCUTANEOUS
  Filled 2023-09-28 (×2): qty 1

## 2023-09-28 MED ORDER — FUROSEMIDE 10 MG/ML IJ SOLN
40.0000 mg | Freq: Once | INTRAMUSCULAR | Status: AC
  Administered 2023-09-28: 40 mg via INTRAVENOUS
  Filled 2023-09-28: qty 4

## 2023-09-28 MED ORDER — ACETAMINOPHEN 325 MG PO TABS
650.0000 mg | ORAL_TABLET | Freq: Four times a day (QID) | ORAL | Status: DC | PRN
  Administered 2023-09-30: 650 mg via ORAL
  Filled 2023-09-28: qty 2

## 2023-09-28 MED ORDER — ONDANSETRON HCL 4 MG PO TABS: 4.0000 mg | ORAL_TABLET | Freq: Four times a day (QID) | ORAL | Status: DC | PRN

## 2023-09-28 MED ORDER — HYDROXYZINE HCL 25 MG PO TABS
25.0000 mg | ORAL_TABLET | Freq: Four times a day (QID) | ORAL | Status: DC | PRN
  Filled 2023-09-28: qty 1

## 2023-09-28 MED ORDER — METOPROLOL SUCCINATE ER 25 MG PO TB24
25.0000 mg | ORAL_TABLET | Freq: Every day | ORAL | Status: DC
  Administered 2023-09-29 – 2023-10-03 (×5): 25 mg via ORAL
  Filled 2023-09-28 (×5): qty 1

## 2023-09-28 MED ORDER — ASPIRIN 81 MG PO TBEC
81.0000 mg | DELAYED_RELEASE_TABLET | Freq: Every day | ORAL | Status: DC
  Administered 2023-09-29 – 2023-10-03 (×5): 81 mg via ORAL
  Filled 2023-09-28 (×5): qty 1

## 2023-09-28 MED ORDER — DAPAGLIFLOZIN PROPANEDIOL 10 MG PO TABS
10.0000 mg | ORAL_TABLET | Freq: Every day | ORAL | Status: DC
  Administered 2023-09-29 – 2023-10-03 (×4): 10 mg via ORAL
  Filled 2023-09-28 (×5): qty 1

## 2023-09-28 MED ORDER — ONDANSETRON HCL 4 MG/2ML IJ SOLN: 4.0000 mg | Freq: Four times a day (QID) | INTRAMUSCULAR | Status: DC | PRN

## 2023-09-28 NOTE — Assessment & Plan Note (Addendum)
-   Patient has ongoing gross hematuria, -CT urogram on 09/11/2023 at [facility] showed  1. Similar-appearing diffuse bladder wall thickening, scattered bladder diverticulum, and pericystic fat stranding, likely in the setting of cystitis. If there is clinical concern for underlying bladder malignancy, recommend cystoscopy.  2. No acute pathologies in the bilateral kidneys or ureters.  -Urology outpatient follow up--can consider inpatient

## 2023-09-28 NOTE — Assessment & Plan Note (Addendum)
Appears clinically euvolemic Continue metoprolol, Farxiga and torsemide

## 2023-09-28 NOTE — Progress Notes (Signed)
PHARMACIST - PHYSICIAN COMMUNICATION  CONCERNING:  Enoxaparin (Lovenox) for DVT Prophylaxis    RECOMMENDATION: Patient was prescribed enoxaprin 40mg  q24 hours for VTE prophylaxis.   Filed Weights   09/28/23 1804  Weight: 136.1 kg (300 lb)    Body mass index is 39.58 kg/m.  Estimated Creatinine Clearance: 115.7 mL/min (by C-G formula based on SCr of 0.91 mg/dL).   Based on [facility] policy patient is candidate for enoxaparin 0.5mg /kg TBW SQ every 24 hours based on BMI being >30.  DESCRIPTION: Pharmacy has adjusted enoxaparin dose per [facility] policy.  Patient is now receiving enoxaparin 67.5 mg every 24 hours    Merryl Hacker, PharmD Clinical Pharmacist  09/28/2023 8:31 PM

## 2023-09-28 NOTE — Assessment & Plan Note (Addendum)
Elevated troponin Troponin 158, suspect demand ischemia, will continue to trend No complaints of chest pain Continue aspirin, atorvastatin and metoprolol, NTG SL as needed chest pain

## 2023-09-28 NOTE — ED Triage Notes (Signed)
First Nurse Note;  Pt via [facility] from home. Pt c/o back pain x7 days, hematuria, and hematemesis.Also c/o CP and SOB pt was dx with PNA last week. 12 lead unremarkable per EMS, lung sounds clear. Pt has a hx of CHF. Pt has a hx of MI a few months ago. Pt is A&Ox4 and NAD 99 HR  99.0 oral  143/88 BP  89% on RA, EMS placed on 3l [place]  170 CBG

## 2023-09-28 NOTE — H&P (Signed)
History and Physical    Patient: Cody Alexander ZOX:[id number] DOB: 1956-04-01 DOA: 09/28/2023 DOS: the patient was seen and examined on 09/28/2023 PCP: [facility]  Patient coming from: Home  Chief Complaint:  Chief Complaint  Patient presents with   Back Pain    HPI: Cody Alexander is a 68 y.o. male with medical history significant for Diastolic CHF, COPD on 3-4 L O2, hypertension, CAD with stent, DM on insulin, OSA, morbid obesity, with several past hospitalizations for worsening respiratory failure secondary to COPD/CHF exacerbation, most recently 1/15 to 09/13/2023 at [facility], with history of intubation in 12/2022 for up to a week, who presents to the ED By EMS with shortness of breath, coughing up blood-streaked sputum.Marland Kitchen  He also complains of blood in his urine which was evaluated during his recent hospitalization  to [facility] with CT showing bladder wall thickening for which he was given a urology outpatient appointment.  He has back pain which she states is new for the past 2 weeks since he was at [facility].  He denies abdominal pain, chest pain, vomiting or diarrhea..  With EMS O2 sats in the mid 80s on his home flow rate of 3 L ED course and data review: Tachycardic to 103 with O2 sat 89% on 3 L Labs notable for WBC 17,000, lactic acid pending.  Platelets normal, INR pending Hemoglobin normal at 15, BMP pending Lactic acid 0.9, BNP 119,  UA with moderate hemoglobin large leuks and rare bacteria  EKG NSR at 99 with TWI lateral leads Chest x-ray showed no active disease  Patient was wheezing on exam.  Treated with DuoNebs O2 sat at time of request for admission was 92 on 3 L Hospitalist consulted for admission.   Review of Systems: As mentioned in the history of present illness. All other systems reviewed and are negative.  Past Medical History:  Diagnosis Date   CHF (congestive heart failure) (HCC)    COPD (chronic obstructive pulmonary disease) (HCC)    Diabetes  mellitus without complication (HCC)    Hypertension    Neuropathy    Past Surgical History:  Procedure Laterality Date   LEFT HEART CATH AND CORONARY ANGIOGRAPHY Right 02/27/2018   Procedure: LEFT HEART CATH AND CORONARY ANGIOGRAPHY;  Surgeon: Laurier Nancy, MD;  Location: [facility] INVASIVE CV LAB;  Service: Cardiovascular;  Laterality: Right;   Social History:  reports that he has quit smoking. His smoking use included cigarettes. He has never used smokeless tobacco. He reports that he does not drink alcohol and does not use drugs.  Allergies  Allergen Reactions   Erythromycin Anaphylaxis and Swelling    Had eye swelling with erythromycin during a time when he had perf ear drum.  Tolerated azithromycin.    Family History  Problem Relation Age of Onset   Stroke Mother    Hypertension Father     Prior to Admission medications   Medication Sig Start Date End Date Taking? Authorizing Provider  albuterol (PROVENTIL HFA) 108 (90 Base) MCG/ACT inhaler Inhale 2 puffs into the lungs every 4 (four) hours as needed. 06/19/23   [name], MD  ASPIRIN EC 81 MG EC tablet Take 81 mg by mouth daily. 11/15/22   [provider]  atorvastatin (LIPITOR) 80 MG tablet Take 1 tablet by mouth daily. 04/24/23 04/23/24  [provider]  budesonide-formoterol (SYMBICORT) 160-4.5 MCG/ACT inhaler Inhale 2 puffs into the lungs 2 (two) times daily. 06/19/23   [name], MD  dapagliflozin propanediol (FARXIGA) 10  MG TABS tablet Take 1 tablet (10 mg total) by mouth daily. 08/04/23 11/02/23  [name], MD  fluconazole (DIFLUCAN) 200 MG tablet Take 1 tablet (200 mg total) by mouth once a week. On Saturdays. 08/09/23   [name], MD  hydrocortisone cream 1 % Apply topically 4 (four) times daily. 08/03/23   [name], MD  hydrOXYzine (ATARAX) 25 MG tablet Take by mouth every 6 (six) hours as needed for itching. 07/06/23   [provider]  insulin lispro (HUMALOG) 100 UNIT/ML KwikPen Inject 10 Units  into the skin 3 (three) times daily with meals. 06/19/23 09/17/23  [name], MD  ipratropium-albuterol (DUONEB) 0.5-2.5 (3) MG/3ML SOLN Inhale 3 mLs by nebulization once every 6 (six) hours as needed. 06/19/23   [name], MD  JARDIANCE 25 MG TABS tablet Take 25 mg by mouth daily. 02/24/23   [provider]  LANTUS SOLOSTAR 100 UNIT/ML Solostar Pen Inject 20 Units into the skin daily. 06/19/23 09/17/23  [name], MD  metoprolol succinate (TOPROL-XL) 25 MG 24 hr tablet Take 1 tablet (25 mg total) by mouth daily. 06/19/23 09/17/23  [name], MD  Omeprazole 20 MG TBEC Take 1 tablet by mouth every morning. 02/24/23   [provider]  predniSONE (DELTASONE) 20 MG tablet Take 20 mg (1 tab) from 12/9 to 12/12, then 10 mg (1/2 tab) from 12/13 to 12/16. 08/04/23   [name], MD  torsemide (DEMADEX) 20 MG tablet Take 2 tablets (40 mg total) by mouth daily. 06/19/23 09/17/23  [name], MD    Physical Exam: Vitals:   09/28/23 1801 09/28/23 1804  BP: 118/80   Pulse: (!) 103   Resp: 18   Temp: 97.9 F (36.6 C)   TempSrc: Oral   SpO2: (!) 89%   Weight:  136.1 kg  Height:  6\' 1"  (1.854 m)   Physical Exam Vitals and nursing note reviewed.  Constitutional:      General: He is not in acute distress. HENT:     Head: Normocephalic and atraumatic.  Cardiovascular:     Rate and Rhythm: Regular rhythm. Tachycardia present.     Heart sounds: Normal heart sounds.  Pulmonary:     Effort: Tachypnea present.     Breath sounds: Normal breath sounds.  Abdominal:     Palpations: Abdomen is soft.     Tenderness: There is no abdominal tenderness.  Neurological:     Mental Status: Mental status is at baseline.     Labs on Admission: I have personally reviewed following labs and imaging studies  CBC: Recent Labs  Lab 09/28/23 1807  WBC 17.1*  HGB 15.1  HCT 47.5  MCV 91.0  PLT 270   Basic Metabolic Panel: Recent Labs  Lab 09/28/23 1807  NA 135  K 4.4  CL 95*  CO2 29  GLUCOSE  156*  BUN 14  CREATININE 0.91  CALCIUM 9.1   GFR: Estimated Creatinine Clearance: 115.7 mL/min (by C-G formula based on SCr of 0.91 mg/dL). Liver Function Tests: No results for input(s): "AST", "ALT", "ALKPHOS", "BILITOT", "PROT", "ALBUMIN" in the last 168 hours. No results for input(s): "LIPASE", "AMYLASE" in the last 168 hours. No results for input(s): "AMMONIA" in the last 168 hours. Coagulation Profile: No results for input(s): "INR", "PROTIME" in the last 168 hours. Cardiac Enzymes: No results for input(s): "CKTOTAL", "CKMB", "CKMBINDEX", "TROPONINI" in the last 168 hours. BNP (last 3 results) No results for input(s): "PROBNP" in the last 8760 hours. HbA1C: No results for  input(s): "HGBA1C" in the last 72 hours. CBG: No results for input(s): "GLUCAP" in the last 168 hours. Lipid Profile: No results for input(s): "CHOL", "HDL", "LDLCALC", "TRIG", "CHOLHDL", "LDLDIRECT" in the last 72 hours. Thyroid Function Tests: No results for input(s): "TSH", "T4TOTAL", "FREET4", "T3FREE", "THYROIDAB" in the last 72 hours. Anemia Panel: No results for input(s): "VITAMINB12", "FOLATE", "FERRITIN", "TIBC", "IRON", "RETICCTPCT" in the last 72 hours. Urine analysis:    Component Value Date/Time   COLORURINE YELLOW (A) 09/28/2023 1928   APPEARANCEUR HAZY (A) 09/28/2023 1928   APPEARANCEUR Clear 08/20/2013 0849   LABSPEC 1.012 09/28/2023 1928   LABSPEC 1.014 08/20/2013 0849   PHURINE 6.0 09/28/2023 1928   GLUCOSEU NEGATIVE 09/28/2023 1928   GLUCOSEU Negative 08/20/2013 0849   HGBUR MODERATE (A) 09/28/2023 1928   BILIRUBINUR NEGATIVE 09/28/2023 1928   BILIRUBINUR Negative 08/20/2013 0849   KETONESUR NEGATIVE 09/28/2023 1928   PROTEINUR 100 (A) 09/28/2023 1928   NITRITE NEGATIVE 09/28/2023 1928   LEUKOCYTESUR LARGE (A) 09/28/2023 1928   LEUKOCYTESUR Negative 08/20/2013 0849    Radiological Exams on Admission: DG Chest Port 1 View Result Date: 09/28/2023 CLINICAL DATA:  Shortness of  breath EXAM: PORTABLE CHEST 1 VIEW COMPARISON:  07/23/2023 FINDINGS: Cardiomegaly. No acute airspace disease, pleural effusion or pneumothorax. IMPRESSION: No active disease. Cardiomegaly. Electronically Signed   By: Jasmine Pang M.D.   On: 09/28/2023 19:22     Data Reviewed: Relevant notes from primary care and specialist visits, past discharge summaries as available in EHR, including Care Everywhere. Prior diagnostic testing as pertinent to current admission diagnoses Updated medications and problem lists for reconciliation ED course, including vitals, labs, imaging, treatment and response to treatment Triage notes, nursing and pharmacy notes and ED provider's notes Notable results as noted in HPI   Assessment and Plan: Acute on chronic respiratory failure with hypoxia and hypercapnia (HCC) COPD exacerbation OSA Scheduled and as needed nebulized bronchodilators IV steroids Antitussives, flutter valve CPAP nightly Supplemental O2 to keep sats 92 to 96%-on 3 L at baseline  Hematuria - Patient has ongoing gross hematuria, -CT urogram on 09/11/2023 at [facility] showed  1. Similar-appearing diffuse bladder wall thickening, scattered bladder diverticulum, and pericystic fat stranding, likely in the setting of cystitis. If there is clinical concern for underlying bladder malignancy, recommend cystoscopy.  2. No acute pathologies in the bilateral kidneys or ureters.  -Urology outpatient follow up--can consider inpatient  CAD with history of stent angioplasty Elevated troponin Troponin 158, suspect demand ischemia, will continue to trend No complaints of chest pain Continue aspirin, atorvastatin and metoprolol, NTG SL as needed chest pain  Chronic back pain Pain control  Diabetes mellitus without complication (HCC) Continue basal insulin Sliding scale insulin coverage  CHF (congestive heart failure), NYHA class I, acute, diastolic (HCC) Appears clinically euvolemic Continue  metoprolol, Farxiga and torsemide        DVT prophylaxis: SCD given hematuria  Consults: none  Advance Care Planning:   Code Status: Prior   Family Communication: none  Disposition Plan: Back to previous home environment  Severity of Illness: The appropriate patient status for this patient is INPATIENT. Inpatient status is judged to be reasonable and necessary in order to provide the required intensity of service to ensure the patient's safety. The patient's presenting symptoms, physical exam findings, and initial radiographic and laboratory data in the context of their chronic comorbidities is felt to place them at high risk for further clinical deterioration. Furthermore, it is not anticipated that the patient will be medically  stable for discharge from the hospital within 2 midnights of admission.   * I certify that at the point of admission it is my clinical judgment that the patient will require inpatient hospital care spanning beyond 2 midnights from the point of admission due to high intensity of service, high risk for further deterioration and high frequency of surveillance required.*  Author: Andris Baumann, MD 09/28/2023 8:17 PM  For on call review [email or web address].

## 2023-09-28 NOTE — Assessment & Plan Note (Signed)
 Pain control

## 2023-09-28 NOTE — ED Provider Notes (Signed)
[facility] Provider Note    Event Date/Time   First MD Initiated Contact with Patient 09/28/23 [phone]     (approximate)   History   Back Pain   HPI  Avi Kerschner is a 68 y.o. male with history of CHF, COPD, diabetes, hypertension who presents with complaints of cough, possible hemoptysis, shortness of breath, he wears 3 L nasal cannula chronically but reports that his oxygen saturations were in the 80s at home on 3 L, seem to be improved now.  Review of records demonstrates prolonged admission at the end of November at our hospital, then again admitted at [facility] on January 15 treated for COPD and CHF.  Also he notes some hematuria, patient had MRI of his abdomen which demonstrated bladder thickening at [facility], urology outpatient consultation recommended     Physical Exam   Triage Vital Signs: ED Triage Vitals  Encounter Vitals Group     BP 09/28/23 1801 118/80     Systolic BP Percentile --      Diastolic BP Percentile --      Pulse Rate 09/28/23 1801 (!) 103     Resp 09/28/23 1801 18     Temp 09/28/23 1801 97.9 F (36.6 C)     Temp Source 09/28/23 1801 Oral     SpO2 09/28/23 1801 (!) 89 %     Weight 09/28/23 1804 136.1 kg (300 lb)     Height 09/28/23 1804 1.854 m (6\' 1" )     Head Circumference --      Peak Flow --      Pain Score 09/28/23 1804 10     Pain Loc --      Pain Education --      Exclude from Growth Chart --     Most recent vital signs: Vitals:   09/28/23 1801  BP: 118/80  Pulse: (!) 103  Resp: 18  Temp: 97.9 F (36.6 C)  SpO2: (!) 89%     General: Awake, no distress.  CV:  Good peripheral perfusion.  Resp:  Normal effort.  Scattered wheezes Abd:  No distention.  Other:  2+ lower extremity edema   ED Results / Procedures / Treatments   Labs (all labs ordered are listed, but only abnormal results are displayed) Labs Reviewed  CBC - Abnormal; Notable for the following components:      Result Value   WBC 17.1  (*)    All other components within normal limits  BASIC METABOLIC PANEL - Abnormal; Notable for the following components:   Chloride 95 (*)    Glucose, Bld 156 (*)    All other components within normal limits  CULTURE, BLOOD (ROUTINE X 2)  CULTURE, BLOOD (ROUTINE X 2)  URINALYSIS, ROUTINE W REFLEX MICROSCOPIC  BRAIN NATRIURETIC PEPTIDE  LACTIC ACID, PLASMA  LACTIC ACID, PLASMA  TROPONIN I (HIGH SENSITIVITY)     EKG     RADIOLOGY Chest x-ray viewed interpret by me, no acute abnormality    PROCEDURES:  Critical Care performed: yes  CRITICAL CARE Performed by: Jene Every   Total critical care time: 30  minutes  Critical care time was exclusive of separately billable procedures and treating other patients.  Critical care was necessary to treat or prevent imminent or life-threatening deterioration.  Critical care was time spent personally by me on the following activities: development of treatment plan with patient and/or surrogate as well as nursing, discussions with consultants, evaluation of patient's response to treatment, examination of  patient, obtaining history from patient or surrogate, ordering and performing treatments and interventions, ordering and review of laboratory studies, ordering and review of radiographic studies, pulse oximetry and re-evaluation of patient's condition.   Procedures   MEDICATIONS ORDERED IN ED: Medications  acetaminophen (TYLENOL) tablet 1,000 mg (1,000 mg Oral Patient Refused/Not Given 09/28/23 1958)  furosemide (LASIX) injection 40 mg (has no administration in time range)  methylPREDNISolone sodium succinate (SOLU-MEDROL) 125 mg/2 mL injection 125 mg (has no administration in time range)  ipratropium-albuterol (DUONEB) 0.5-2.5 (3) MG/3ML nebulizer solution 3 mL (3 mLs Nebulization Given 09/28/23 1948)  ipratropium-albuterol (DUONEB) 0.5-2.5 (3) MG/3ML nebulizer solution 3 mL (3 mLs Nebulization Given 09/28/23 1948)     IMPRESSION  / MDM / ASSESSMENT AND PLAN / ED COURSE  I reviewed the triage vital signs and the nursing notes. Patient's presentation is most consistent with acute presentation with potential threat to life or bodily function.  Patient presents with shortness of breath, cough, hematuria, similar to presentation to [facility] earlier in January.  He has been treated for pneumonia in the past for similar symptoms.  I suspect again combination of COPD and CHF, will start DuoNebs, IV Lasix  Lab work reviewed, elevated white blood cell count could be related to infectious process such as a pneumonia versus the patient has been on p.o. steroids  Oxygen saturation is in the low 90s on 3-1/2 L nasal cannula currently  Creatinine is unremarkable, will start IV Lasix  Have discussed with the hospitalist for admission       FINAL CLINICAL IMPRESSION(S) / ED DIAGNOSES   Final diagnoses:  Acute on chronic respiratory failure with hypoxia (HCC)     Rx / DC Orders   ED Discharge Orders     None        Note:  This document was prepared using Dragon voice recognition software and may include unintentional dictation errors.   Jene Every, MD 09/28/23 2002

## 2023-09-28 NOTE — Assessment & Plan Note (Signed)
Continue basal insulin.  Sliding scale insulin coverage

## 2023-09-28 NOTE — Assessment & Plan Note (Signed)
COPD exacerbation OSA Scheduled and as needed nebulized bronchodilators IV steroids Antitussives, flutter valve CPAP nightly Supplemental O2 to keep sats 92 to 96%-on 3 L at baseline

## 2023-09-28 NOTE — ED Notes (Signed)
Pt placed on his regular 3L [place] and now at 92

## 2023-09-28 NOTE — ED Triage Notes (Signed)
Pt comes with c/o blood in urine and vomit. Pt states this started week ago. Pt does wear 3L O2 at home. Pt states pain in his nipple also. Pt  states some kidney issues.   Pt was dx with pneumonia recently.

## 2023-09-29 ENCOUNTER — Inpatient Hospital Stay: Payer: 59

## 2023-09-29 DIAGNOSIS — R310 Gross hematuria: Secondary | ICD-10-CM

## 2023-09-29 DIAGNOSIS — J441 Chronic obstructive pulmonary disease with (acute) exacerbation: Secondary | ICD-10-CM | POA: Diagnosis not present

## 2023-09-29 LAB — CBC
HCT: 42.8 % (ref 39.0–52.0)
Hemoglobin: 13.2 g/dL (ref 13.0–17.0)
MCH: 29 pg (ref 26.0–34.0)
MCHC: 30.8 g/dL (ref 30.0–36.0)
MCV: 94.1 fL (ref 80.0–100.0)
Platelets: 234 10*3/uL (ref 150–400)
RBC: 4.55 MIL/uL (ref 4.22–5.81)
RDW: 13.8 % (ref 11.5–15.5)
WBC: 15 10*3/uL — ABNORMAL HIGH (ref 4.0–10.5)
nRBC: 0 % (ref 0.0–0.2)

## 2023-09-29 LAB — TROPONIN I (HIGH SENSITIVITY)
Troponin I (High Sensitivity): 100 ng/L (ref <18)
Troponin I (High Sensitivity): 127 ng/L (ref <18)
Troponin I (High Sensitivity): 129 ng/L (ref <18)
Troponin I (High Sensitivity): 83 ng/L — ABNORMAL HIGH (ref <18)
Troponin I (High Sensitivity): 87 ng/L — ABNORMAL HIGH (ref <18)
Troponin I (High Sensitivity): 88 ng/L — ABNORMAL HIGH (ref <18)
Troponin I (High Sensitivity): 90 ng/L — ABNORMAL HIGH (ref <18)
Troponin I (High Sensitivity): 91 ng/L — ABNORMAL HIGH (ref <18)

## 2023-09-29 LAB — BASIC METABOLIC PANEL
Anion gap: 9 (ref 5–15)
BUN: 21 mg/dL (ref 8–23)
CO2: 31 mmol/L (ref 22–32)
Calcium: 8.3 mg/dL — ABNORMAL LOW (ref 8.9–10.3)
Chloride: 93 mmol/L — ABNORMAL LOW (ref 98–111)
Creatinine, Ser: 1.16 mg/dL (ref 0.61–1.24)
GFR, Estimated: >60 mL/min (ref >60)
Glucose, Bld: 493 mg/dL — ABNORMAL HIGH (ref 70–99)
Potassium: 4.9 mmol/L (ref 3.5–5.1)
Sodium: 133 mmol/L — ABNORMAL LOW (ref 135–145)

## 2023-09-29 LAB — URINALYSIS, W/ REFLEX TO CULTURE (INFECTION SUSPECTED)
Bilirubin Urine: NEGATIVE
Glucose, UA: >=500 mg/dL — AB
Ketones, ur: NEGATIVE mg/dL
Nitrite: NEGATIVE
Protein, ur: NEGATIVE mg/dL
Specific Gravity, Urine: 1.006 (ref 1.005–1.030)
Squamous Epithelial / HPF: 0 /[HPF] (ref 0–5)
pH: 6 (ref 5.0–8.0)

## 2023-09-29 LAB — CBG MONITORING, ED
Glucose-Capillary: 436 mg/dL — ABNORMAL HIGH (ref 70–99)
Glucose-Capillary: 406 mg/dL — ABNORMAL HIGH (ref 70–99)
Glucose-Capillary: 211 mg/dL — ABNORMAL HIGH (ref 70–99)

## 2023-09-29 LAB — GLUCOSE, CAPILLARY: Glucose-Capillary: 243 mg/dL — ABNORMAL HIGH (ref 70–99)

## 2023-09-29 LAB — LACTIC ACID, PLASMA: Lactic Acid, Venous: 1 mmol/L (ref 0.5–1.9)

## 2023-09-29 MED ORDER — INSULIN GLARGINE-YFGN 100 UNIT/ML ~~LOC~~ SOLN
20.0000 [IU] | Freq: Every day | SUBCUTANEOUS | Status: DC
  Filled 2023-09-29: qty 0.2

## 2023-09-29 MED ORDER — INSULIN ASPART 100 UNIT/ML IJ SOLN
10.0000 [IU] | Freq: Three times a day (TID) | INTRAMUSCULAR | Status: DC
  Administered 2023-09-29 – 2023-10-03 (×12): 10 [IU] via SUBCUTANEOUS
  Filled 2023-09-29 (×11): qty 1

## 2023-09-29 MED ORDER — INSULIN GLARGINE-YFGN 100 UNIT/ML ~~LOC~~ SOLN
30.0000 [IU] | Freq: Two times a day (BID) | SUBCUTANEOUS | Status: DC
  Administered 2023-09-29 – 2023-10-01 (×6): 30 [IU] via SUBCUTANEOUS
  Filled 2023-09-29 (×8): qty 0.3

## 2023-09-29 MED ORDER — HYDROCOD POLI-CHLORPHE POLI ER 10-8 MG/5ML PO SUER
5.0000 mL | Freq: Two times a day (BID) | ORAL | Status: DC | PRN
  Administered 2023-09-29 – 2023-10-01 (×4): 5 mL via ORAL
  Filled 2023-09-29 (×4): qty 5

## 2023-09-29 MED ORDER — TORSEMIDE 20 MG PO TABS
40.0000 mg | ORAL_TABLET | Freq: Every day | ORAL | Status: DC
  Administered 2023-09-29 – 2023-10-03 (×5): 40 mg via ORAL
  Filled 2023-09-29 (×5): qty 2

## 2023-09-29 NOTE — Progress Notes (Signed)
PT Cancellation Note  Patient Details Name: Cody Alexander MRN: [id number] DOB: 05/10/1956   Cancelled Treatment:    Reason Eval/Treat Not Completed: Other (comment). PT spoke with pt, declining therapy currently due to not feeling well. PT to re-attempt as able.    Olga Coaster PT, DPT 3:03 PM,09/29/23

## 2023-09-29 NOTE — ED Notes (Signed)
 Assumed patient care and received report from the previous nurse.

## 2023-09-29 NOTE — Progress Notes (Addendum)
Progress Note    Cody Alexander  JXB:[id number] DOB: 1955/12/03  DOA: 09/28/2023 PCP: [facility]      Brief Narrative:    Medical records reviewed and are as summarized below:  Cody Alexander is a 68 y.o. male with medical history significant for Diastolic CHF, COPD on 3-4 L O2, hypertension, CAD with stent, DM on insulin, OSA, morbid obesity, with several past hospitalizations for worsening respiratory failure secondary to COPD/CHF exacerbation, most recently 1/15 to 09/13/2023 at [facility], with history of intubation in 12/2022 for up to a week.  She presented to the hospital bloodstained sputum, shortness of breath and general weakness.  He also reported bloody urine and he had recently been evaluated for this while he was hospitalized at [facility] in January 2025.  Outpatient follow-up with urologist was recommended at that time.          Assessment/Plan:   Principal Problem:   COPD with acute exacerbation (HCC) Active Problems:   Acute on chronic respiratory failure with hypoxia and hypercapnia (HCC)   Hematuria   CAD with history of stent angioplasty   CHF (congestive heart failure), NYHA class I, acute, diastolic (HCC)   OSA (obstructive sleep apnea)   Diabetes mellitus without complication (HCC)   Chronic back pain    Body mass index is 39.58 kg/m.  (Morbid obesity)   Acute on chronic respiratory failure with hypoxia and hypercapnia (HCC) COPD exacerbation OSA Continue bronchodilators and steroids Antitussives, flutter valve CPAP nightly Supplemental O2 to keep sats 92 to 96%-on 3 L at baseline    Hematuria Hematuria has been intermittent. Outpatient follow-up with urologist recommended -CT urogram on 09/11/2023 at [facility] showed  1. Similar-appearing diffuse bladder wall thickening, scattered bladder diverticulum, and pericystic fat stranding, likely in the setting of cystitis. If there is clinical concern for underlying bladder malignancy,  recommend cystoscopy.  2. No acute pathologies in the bilateral kidneys or ureters.     CAD with history of stent angioplasty Elevated troponin Mildly elevated troponin which is likely from demand ischemia No chest pain. Continue aspirin, atorvastatin and metoprolol, NTG SL as needed chest pain    Chronic back pain, general weakness Analgesics as needed for pain. Consult PT and OT   Type 2 diabetes mellitus with hyperglycemia Glucose up to 493.  Increase insulin glargine from 20 to 30 units twice daily. Start NovoLog 10 units 3 times daily with meals. Continue correctional sliding scale insulin. Home regimen: Insulin glargine 22 units twice daily, NovoLog 10 units 3 times daily with meals   Acute on chronic diastolic CHF  S/p treatment with IV Lasix. Continue metoprolol, Marcelline Deist and torsemide 2D echo on 09/11/2023 showed EF estimated at greater than 55%, grade 1 diastolic dysfunction,    Left leg pain, bilateral leg swelling Obtain venous duplex of the lower extremities to rule out DVT.           Diet Order             Diet heart healthy/carb modified Room service appropriate? Yes; Fluid consistency: Thin  Diet effective now                            Consultants: None  Procedures: None    Medications:    acetaminophen  1,000 mg Oral Once   aspirin EC  81 mg Oral Daily   atorvastatin  80 mg Oral Daily   dapagliflozin propanediol  10 mg  Oral Q breakfast   enoxaparin (LOVENOX) injection  0.5 mg/kg Subcutaneous Q24H   guaiFENesin  600 mg Oral BID   insulin aspart  0-15 Units Subcutaneous TID WC   insulin aspart  0-5 Units Subcutaneous QHS   insulin glargine-yfgn  20 Units Subcutaneous Daily   ipratropium-albuterol  3 mL Nebulization Q6H   methylPREDNISolone (SOLU-MEDROL) injection  40 mg Intravenous Q12H   Followed by   Melene Muller ON 09/30/2023] predniSONE  40 mg Oral Q breakfast   metoprolol succinate  25 mg Oral Daily   torsemide  40 mg Oral  Daily   Continuous Infusions:   Anti-infectives (From admission, onward)    None              Family Communication/Anticipated D/C date and plan/Code Status   DVT prophylaxis:      Code Status: Full Code  Family Communication: None Disposition Plan: Plan to discharge home   Status is: Inpatient Remains inpatient appropriate because: COPD exacerbation       Subjective:   Interval events noted.  He complains of general weakness, shortness of breath.  He has had intermittent hematuria as well.  Objective:    Vitals:   09/29/23 0450 09/29/23 0652 09/29/23 0700 09/29/23 0800  BP:  117/77 (!) 119/90 128/85  Pulse:  79 77 86  Resp:  (!) 22 17 14   Temp: 98.2 F (36.8 C)     TempSrc:      SpO2:  94% 95% 99%  Weight:      Height:       No data found.   Intake/Output Summary (Last 24 hours) at 09/29/2023 0828 Last data filed at 09/29/2023 0809 Gross per 24 hour  Intake --  Output 1200 ml  Net -1200 ml   Filed Weights   09/28/23 1804  Weight: 136.1 kg    Exam:  GEN: NAD SKIN: Warm and dry EYES: No pallor or icterus ENT: MMM CV: RRR PULM: Decreased air entry bilaterally, bilateral wheezing ABD: soft, obese, NT, +BS CNS: AAO x 3, non focal EXT: Left leg tenderness.  Trace bilateral leg edema        Data Reviewed:   I have personally reviewed following labs and imaging studies:  Labs: Labs show the following:   Basic Metabolic Panel: Recent Labs  Lab 09/28/23 1807 09/29/23 0352  NA 135 133*  K 4.4 4.9  CL 95* 93*  CO2 29 31  GLUCOSE 156* 493*  BUN 14 21  CREATININE 0.91 1.16  CALCIUM 9.1 8.3*   GFR Estimated Creatinine Clearance: 90.7 mL/min (by C-G formula based on SCr of 1.16 mg/dL). Liver Function Tests: No results for input(s): "AST", "ALT", "ALKPHOS", "BILITOT", "PROT", "ALBUMIN" in the last 168 hours. No results for input(s): "LIPASE", "AMYLASE" in the last 168 hours. No results for input(s): "AMMONIA" in the last  168 hours. Coagulation profile No results for input(s): "INR", "PROTIME" in the last 168 hours.  CBC: Recent Labs  Lab 09/28/23 1807 09/29/23 0352  WBC 17.1* 15.0*  HGB 15.1 13.2  HCT 47.5 42.8  MCV 91.0 94.1  PLT 270 234   Cardiac Enzymes: No results for input(s): "CKTOTAL", "CKMB", "CKMBINDEX", "TROPONINI" in the last 168 hours. BNP (last 3 results) No results for input(s): "PROBNP" in the last 8760 hours. CBG: Recent Labs  Lab 09/29/23 0745  GLUCAP 436*   D-Dimer: No results for input(s): "DDIMER" in the last 72 hours. Hgb A1c: No results for input(s): "HGBA1C" in the last 72 hours. Lipid  Profile: No results for input(s): "CHOL", "HDL", "LDLCALC", "TRIG", "CHOLHDL", "LDLDIRECT" in the last 72 hours. Thyroid function studies: No results for input(s): "TSH", "T4TOTAL", "T3FREE", "THYROIDAB" in the last 72 hours.  Invalid input(s): "FREET3" Anemia work up: No results for input(s): "VITAMINB12", "FOLATE", "FERRITIN", "TIBC", "IRON", "RETICCTPCT" in the last 72 hours. Sepsis Labs: Recent Labs  Lab 09/28/23 1807 09/28/23 1928 09/28/23 2343 09/29/23 0352  WBC 17.1*  --   --  15.0*  LATICACIDVEN  --  0.9 1.0  --     Microbiology Recent Results (from the past 240 hours)  Blood culture (routine x 2)     Status: None (Preliminary result)   Collection Time: 09/28/23  7:28 PM   Specimen: BLOOD  Result Value Ref Range Status   Specimen Description BLOOD BLOOD LEFT HAND  Final   Special Requests   Final    BOTTLES DRAWN AEROBIC AND ANAEROBIC Blood Culture results may not be optimal due to an inadequate volume of blood received in culture bottles   Culture   Final    NO GROWTH < 12 HOURS Performed at [facility], [address], [place], [place] [address]    Report Status PENDING  Incomplete  Blood culture (routine x 2)     Status: None (Preliminary result)   Collection Time: 09/28/23  7:28 PM   Specimen: BLOOD  Result Value Ref Range Status   Specimen  Description BLOOD BLOOD RIGHT HAND  Final   Special Requests   Final    BOTTLES DRAWN AEROBIC AND ANAEROBIC Blood Culture adequate volume   Culture   Final    NO GROWTH < 12 HOURS Performed at [facility] [address], [place], [place] [address]    Report Status PENDING  Incomplete    Procedures and diagnostic studies:  DG Chest Port 1 View Result Date: 09/28/2023 CLINICAL DATA:  Shortness of breath EXAM: PORTABLE CHEST 1 VIEW COMPARISON:  07/23/2023 FINDINGS: Cardiomegaly. No acute airspace disease, pleural effusion or pneumothorax. IMPRESSION: No active disease. Cardiomegaly. Electronically Signed   By: Jasmine Pang M.D.   On: 09/28/2023 19:22               LOS: 1 day   Christyanna Mckeon  [facility]   Pager on [email or web address]. If 7PM-7AM, please contact night-coverage at [email or web address]     09/29/2023, 8:28 AM

## 2023-09-29 NOTE — Discharge Instructions (Signed)
Transportation Resources  Agency Name: [facility] Agency Address: [address] [place], [place] [address] Phone: [phone] Email: [email or web address] Website: [email or web address] Service(s) Offered: Housing services, self-sufficiency, congregate meal program, weatherization program, Field seismologist program, emergency food assistance,  housing counseling, home ownership program, wheels-towork program.  Agency Name: [facility] [employer] [phone]) Address: [address] [place], [place] [address] Phone: [phone] Website: [email or web address] Service(s) Offered: Transportation for [employer], subscription and demand response; Dial-a-Ride for citizens 10 years of age or older.  Agency Name: [employer] Address: [address] [place], [place] [address] Phone: [phone] Service(s) Offered: Child support services; child welfare services; food stamps; Medicaid; work first family assistance; and aid with fuel,  rent, food and medicine, transportation assistance.  Agency Name: Disabled [employer] (DAV) Transportation  Network Phone: [phone] Service(s) Offered: Transports veterans to the [facility] medical center. Call  forty-eight hours in advance and leave the name, telephone  number, date, and time of appointment. Veteran will be  contacted by the driver the day before the appointment to  arrange a pick up point   Transportation Resources  Agency Name: [facility] Agency Address: [address] [place], [place] [address] Phone: [phone] Email: [email or web address] Website: [email or web address] Service(s) Offered: Housing services, self-sufficiency, congregate meal program, weatherization program, Field seismologist program, emergency food assistance,  housing counseling, home ownership program, wheels-towork  program.  Agency Name: [facility] [employer] [phone]) Address: [address], [place], [place] [address] Phone: [phone] Website: [email or web address] Service(s) Offered: Transportation for [employer], subscription and demand response; Dial-a-Ride for citizens 40 years of age or older.  Agency Name: Department of Social Services Address: [address] [place], [place] [address] Phone: [phone] Service(s) Offered: Child support services; child welfare services; food stamps; Medicaid; work first family assistance; and aid with fuel,  rent, food and medicine, transportation assistance.  Agency Name: [employer]abled [employer] (DAV) Transportation  Network Phone: [phone] Service(s) Offered: Transports veterans to the [facility] medical center. Call  forty-eight hours in advance and leave the name, telephone  number, date, and time of appointment. Veteran will be  contacted by the driver the day before the appointment to  arrange a pick up point    [employer] ACTA currently provides door to door services. ACTA connects with PART daily for services to [facility]. ACTA also performs contract services to [employer] operates 27 vehicles, all but 3 mini-vans are equipped with lifts for special needs as well as the general public. ACTA drivers are each CDL certified and trained in First Aid and CPR. ACTA was established in 2002 by [employer]. An independent Industrial/product designer. ACTA operates via Cytogeneticist with required Research scientist (physical sciences) from [place]. ACTA provides over 80,000 passenger trips each year, including [facility] and [employer] sites.  Call at least by 11 AM one business day prior to needing transportation  [employer].                      [place], [place] [address]     Office  Hours: Monday-Friday  8 AM - 5 PM

## 2023-09-29 NOTE — Consult Note (Signed)
Urology Consult  I have been asked to see the patient by Dr. Para March, for evaluation and management of gross hematuria.  Chief Complaint: Back pain, hematuria, hematemesis  History of Present Illness: Cody Alexander is a 68 y.o. year old male with PMH CAD s/p stent, CHF, COPD on 3L [place] chronically, DM on Farxiga, and a recent history of CHF/COPD exacerbation with recent admission at [facility] admitted overnight with acute on chronic respiratory failure and ongoing gross hematuria.  He was noted to have gross hematuria during his recent admission at [facility].  CT urogram showed diffuse bladder wall thickening, scattered bladder diverticula, and perivesical stranding.  He was recommended to undergo outpatient urology follow-up.  Urine culture grew mixed urogenital flora.  Hemoglobin slightly down today, 13.2, 15.1 yesterday.  Suspect dilutional.  Creatinine up, 1.16, baseline 0.9.  Admission UA with >50 WBC/hpf, 11-20 RBCs/hpf, and rare bacteria.  No urine culture ordered, blood culture pending with no growth at <12 hours.  He is spontaneously voiding clear, yellow urine today. He is scheduled to follow up with [facility] urology in April.  Anti-infectives (From admission, onward)    None      Past Medical History:  Diagnosis Date   CHF (congestive heart failure) (HCC)    COPD (chronic obstructive pulmonary disease) (HCC)    Diabetes mellitus without complication (HCC)    Hypertension    Neuropathy     Past Surgical History:  Procedure Laterality Date   LEFT HEART CATH AND CORONARY ANGIOGRAPHY Right 02/27/2018   Procedure: LEFT HEART CATH AND CORONARY ANGIOGRAPHY;  Surgeon: Laurier Nancy, MD;  Location: [facility] INVASIVE CV LAB;  Service: Cardiovascular;  Laterality: Right;    Home Medications:  Current Meds  Medication Sig   albuterol (PROVENTIL HFA) 108 (90 Base) MCG/ACT inhaler Inhale 2 puffs into the lungs every 4 (four) hours as needed.   ASPIRIN EC 81 MG EC tablet Take 81 mg by mouth  daily.   atorvastatin (LIPITOR) 80 MG tablet Take 1 tablet by mouth daily.   budesonide-formoterol (SYMBICORT) 160-4.5 MCG/ACT inhaler Inhale 2 puffs into the lungs 2 (two) times daily.   bumetanide (BUMEX) 1 MG tablet Take 3 mg by mouth daily.   dapagliflozin propanediol (FARXIGA) 10 MG TABS tablet Take 1 tablet (10 mg total) by mouth daily.   fluconazole (DIFLUCAN) 200 MG tablet Take 1 tablet (200 mg total) by mouth once a week. On Saturdays.   hydrocortisone cream 1 % Apply topically 4 (four) times daily.   hydrOXYzine (ATARAX) 25 MG tablet Take by mouth every 6 (six) hours as needed for itching.   insulin lispro (HUMALOG) 100 UNIT/ML KwikPen Inject 10 Units into the skin 3 (three) times daily with meals.   ipratropium-albuterol (DUONEB) 0.5-2.5 (3) MG/3ML SOLN Inhale 3 mLs by nebulization once every 6 (six) hours as needed.   losartan (COZAAR) 50 MG tablet Take 1 tablet by mouth daily.   metoprolol succinate (TOPROL-XL) 25 MG 24 hr tablet Take 1 tablet (25 mg total) by mouth daily.   Omeprazole 20 MG TBEC Take 1 tablet by mouth every morning.   polyethylene glycol (MIRALAX / GLYCOLAX) 17 g packet Take 17 g by mouth daily as needed for moderate constipation.   spironolactone (ALDACTONE) 25 MG tablet Take 1 tablet by mouth daily.    Allergies:  Allergies  Allergen Reactions   Erythromycin Anaphylaxis and Swelling    Had eye swelling with erythromycin during a time when he had perf ear drum.  Tolerated azithromycin.  Family History  Problem Relation Age of Onset   Stroke Mother    Hypertension Father     Social History:  reports that he has quit smoking. His smoking use included cigarettes. He has never used smokeless tobacco. He reports that he does not drink alcohol and does not use drugs.  ROS: A complete review of systems was performed.  All systems are negative except for pertinent findings as noted.  Physical Exam:  Vital signs in last 24 hours: Temp:  [97.9 F (36.6  C)-98.5 F (36.9 C)] 97.9 F (36.6 C) (02/03 0859) Pulse Rate:  [77-103] 77 (02/03 1200) Resp:  [14-25] 20 (02/03 1200) BP: (102-134)/(77-95) 120/95 (02/03 1200) SpO2:  [89 %-99 %] 91 % (02/03 1200) Weight:  [136.1 kg] 136.1 kg (02/02 1804) Constitutional:  Alert and oriented, no acute distress HEENT: [place] AT, moist mucus membranes Cardiovascular: No clubbing, cyanosis, or edema Respiratory: Normal respiratory effort, cough Skin: No rashes, bruises or suspicious lesions Neurologic: Grossly intact, no focal deficits, moving all 4 extremities Psychiatric: Normal mood and affect  Laboratory Data:  Recent Labs    09/28/23 1807 09/29/23 0352  WBC 17.1* 15.0*  HGB 15.1 13.2  HCT 47.5 42.8   Recent Labs    09/28/23 1807 09/29/23 0352  NA 135 133*  K 4.4 4.9  CL 95* 93*  CO2 29 31  GLUCOSE 156* 493*  BUN 14 21  CREATININE 0.91 1.16  CALCIUM 9.1 8.3*   Urinalysis    Component Value Date/Time   COLORURINE YELLOW (A) 09/28/2023 1928   APPEARANCEUR HAZY (A) 09/28/2023 1928   APPEARANCEUR Clear 08/20/2013 0849   LABSPEC 1.012 09/28/2023 1928   LABSPEC 1.014 08/20/2013 0849   PHURINE 6.0 09/28/2023 1928   GLUCOSEU NEGATIVE 09/28/2023 1928   GLUCOSEU Negative 08/20/2013 0849   HGBUR MODERATE (A) 09/28/2023 1928   BILIRUBINUR NEGATIVE 09/28/2023 1928   BILIRUBINUR Negative 08/20/2013 0849   KETONESUR NEGATIVE 09/28/2023 1928   PROTEINUR 100 (A) 09/28/2023 1928   NITRITE NEGATIVE 09/28/2023 1928   LEUKOCYTESUR LARGE (A) 09/28/2023 1928   LEUKOCYTESUR Negative 08/20/2013 0849   Results for orders placed or performed during the hospital encounter of 09/28/23  Blood culture (routine x 2)     Status: None (Preliminary result)   Collection Time: 09/28/23  7:28 PM   Specimen: BLOOD  Result Value Ref Range Status   Specimen Description BLOOD BLOOD LEFT HAND  Final   Special Requests   Final    BOTTLES DRAWN AEROBIC AND ANAEROBIC Blood Culture results may not be optimal due to  an inadequate volume of blood received in culture bottles   Culture   Final    NO GROWTH < 12 HOURS Performed at [facility], [address] [place], [place] [address]    Report Status PENDING  Incomplete  Blood culture (routine x 2)     Status: None (Preliminary result)   Collection Time: 09/28/23  7:28 PM   Specimen: BLOOD  Result Value Ref Range Status   Specimen Description BLOOD BLOOD RIGHT HAND  Final   Special Requests   Final    BOTTLES DRAWN AEROBIC AND ANAEROBIC Blood Culture adequate volume   Culture   Final    NO GROWTH < 12 HOURS Performed at [facility], [address], [place], [place] [address]    Report Status PENDING  Incomplete    Radiologic Imaging: DG Chest Port 1 View Result Date: 09/28/2023 CLINICAL DATA:  Shortness of breath EXAM: PORTABLE  CHEST 1 VIEW COMPARISON:  07/23/2023 FINDINGS: Cardiomegaly. No acute airspace disease, pleural effusion or pneumothorax. IMPRESSION: No active disease. Cardiomegaly. Electronically Signed   By: Jasmine Pang M.D.   On: 09/28/2023 19:22   Assessment & Plan:  68 year old comorbid male with a recent history of gross hematuria scheduled for outpatient follow-up with [facility] in April.  Gross hematuria has resolved.  I added on a urine culture, recommend treating per results.  He prefers to keep his [facility] follow-up, since they are closer to his home.  Additional care per primary team.  Thank you for involving me in this patient's care, please page with any further questions or concerns.  Carman Ching, PA-C 09/29/2023 12:18 PM

## 2023-09-29 NOTE — TOC Initial Note (Signed)
Transition of Care [facility]) - Initial/Assessment Note    Patient Details  Name: Cody Alexander MRN: [id number] Date of Birth: 11-25-1955  Transition of Care [facility]) CM/SW Contact:    Margarito Liner, LCSW Phone Number: 09/29/2023, 1:07 PM  Clinical Narrative: Readmission prevention screen complete. CSW met with patient. No family at bedside. CSW introduced role and explained that discharge planning would be discussed. PCP is [facility]. Patient has not recently used Medicaid Transportation because they need a few days notice. [facility] [place] He has to pay a friend to take him to pick up his medications. Patient lives with his 27 year old roommate, her daughter, and daughter's boyfriend/husband. He is active with [facility] for PT, OT, RN, He has a RW at home as well as oxygen and a bipap through [facility]. DSS has been working on finding patient somewhere else to live but he said all they find is nursing homes. Patient does not want to turn over his check. Patient asked about ALF placement. When CSW explained he would still be turning over his check for ALF he stated he knew that. CSW left voicemail for his assigned DSS social worker, Verlon Au. No further concerns. CSW will continue to follow patient for support and facilitate return home once stable. He will need a cab home at discharge and confirmed he cannot pay for it.                 Expected Discharge Plan: Home w Home Health Services Barriers to Discharge: Continued Medical Work up   Patient Goals and CMS Choice            Expected Discharge Plan and Services     Post Acute Care Choice: Resumption of Svcs/PTA Provider Living arrangements for the past 2 months: Single Family Home                           HH Arranged: RN, OT, PT HH Agency: [facility] Date [facility] Agency Contacted: 09/29/23   Representative spoke with at [facility] Agency: Gearldine Bienenstock  Prior Living  Arrangements/Services Living arrangements for the past 2 months: Single Family Home Lives with:: Roommate Patient language and need for interpreter reviewed:: Yes Do you feel safe going back to the place where you live?: Yes      Need for Family Participation in Patient Care: Yes (Comment) Care giver support system in place?: Yes (comment) Current home services: DME, Home RN, Home PT, Home OT Criminal Activity/Legal Involvement Pertinent to Current Situation/Hospitalization: No - Comment as needed  Activities of Daily Living      Permission Sought/Granted Permission sought to share information with : Facility Industrial/product designer granted to share information with : Yes, Verbal Permission Granted     Permission granted to share info w AGENCY: [facility]        Emotional Assessment Appearance:: Appears stated age Attitude/Demeanor/Rapport: Engaged, Gracious Affect (typically observed): Accepting, Appropriate, Calm, Pleasant Orientation: : Oriented to Self, Oriented to Place, Oriented to  Time, Oriented to Situation Alcohol / Substance Use: Not Applicable Psych Involvement: No (comment)  Admission diagnosis:  COPD with acute exacerbation (HCC) [J44.1] Patient Active Problem List   Diagnosis Date Noted   Hematuria 09/28/2023   Hypertensive emergency 07/23/2023   CHF exacerbation (HCC) 07/23/2023   Respiratory failure (HCC) 06/12/2023   Goals of care, counseling/discussion 01/16/2023   History of kidney stones 01/06/2023  Hypokalemia 01/05/2023   Bilateral leg pain 11/10/2022   Chest pain    Syncope and collapse    Chronic back pain    Acute on chronic respiratory failure with hypoxia and hypercapnia (HCC) 03/14/2022   Diabetes mellitus without complication (HCC)    Acute exacerbation of chronic low back pain    Urinary tract infection    HLD (hyperlipidemia) 01/27/2022   CAD with history of stent angioplasty 01/27/2022   Fall at home, initial  encounter 01/27/2022   Leg weakness, bilateral 01/27/2022   Hyperkalemia    Acute CHF (congestive heart failure) (HCC) 08/15/2021   Benign prostatic hyperplasia with urinary hesitancy    Lower abdominal pain    Elevated troponin    Rash    Uncontrolled type 2 diabetes mellitus with hyperglycemia (HCC) 01/04/2021   Acute on chronic diastolic CHF (congestive heart failure) (HCC) 01/04/2021   CHF (congestive heart failure), NYHA class I, acute, diastolic (HCC) 01/04/2021   OSA (obstructive sleep apnea) 01/04/2021   Obesity (BMI 30-39.9) 01/04/2021   Chronic respiratory failure with hypoxia (HCC) 01/04/2021   Acute on chronic congestive heart failure (HCC)    Acute on chronic respiratory failure with hypoxia (HCC) 06/20/2020   NSTEMI (non-ST elevated myocardial infarction) (HCC) 02/25/2018   Obesity, Class III, BMI 40-49.9 (morbid obesity) (HCC) 01/19/2017   COPD with acute exacerbation (HCC) 05/13/2014   Uncontrolled type 2 diabetes mellitus with hyperglycemia, with long-term current use of insulin (HCC) 05/13/2014   Essential hypertension 05/13/2014   PCP:  [facility] Pharmacy:   [id number] [place], [place] - [address] [place] [address] Phone: [phone] Fax: [phone]  [id number] [place] - [address] [place] [address] Phone: [phone] Fax: [phone]  [facility] [address] [place] [address] Phone: [phone] Fax: [phone]     Social Drivers of Health (SDOH) Social History: SDOH Screenings   Food Insecurity: No Food Insecurity (07/23/2023)  Housing: Medium Risk (07/23/2023)  Transportation Needs: Unmet Transportation Needs (07/23/2023)  Utilities: Not At Risk (07/23/2023)  Alcohol Screen: Low Risk  (07/23/2023)  Financial Resource Strain: Low Risk  (07/23/2023)  Physical Activity: Inactive (09/11/2023)    Received from [facility]  Social Connections: Moderately Isolated (09/11/2023)   Received from [facility]  Stress: Stress Concern Present (09/11/2023)   Received from [facility]  Tobacco Use: Medium Risk (09/28/2023)  Health Literacy: Medium Risk (09/11/2023)   [facility] from [facility]   SDOH Interventions:     Readmission Risk Interventions    09/29/2023    1:04 PM 07/23/2023    1:58 PM 06/25/2022   10:13 AM  Readmission Risk Prevention Plan  Transportation Screening Complete Complete Complete  PCP or Specialist Appt within 3-5 Days Complete Complete Complete  HRI or Home Care Consult Complete Complete Complete  Social Work Consult for Recovery Care Planning/Counseling Complete Complete Complete  Palliative Care Screening Not Applicable Not Applicable Not Applicable  Medication Review Oceanographer) Complete Complete Complete

## 2023-09-29 NOTE — Progress Notes (Signed)
OT Cancellation Note  Patient Details Name: Loghan Kurtzman MRN: [id number] DOB: November 17, 1955   Cancelled Treatment:    Reason Eval/Treat Not Completed: Patient declined, no reason specified. Consult received, chart reviewed. Pt declining therapy this afternoon 2/2 not feeling well. Will re-attempt at later date/time as able.  Arman Filter., MPH, MS, OTR/L ascom [phone] 09/29/23, 3:33 PM

## 2023-09-29 NOTE — ED Notes (Signed)
Patient requested a bath and was provided with such.

## 2023-09-30 DIAGNOSIS — J441 Chronic obstructive pulmonary disease with (acute) exacerbation: Secondary | ICD-10-CM | POA: Diagnosis not present

## 2023-09-30 LAB — CBC WITH DIFFERENTIAL/PLATELET
Abs Immature Granulocytes: 0.07 10*3/uL (ref 0.00–0.07)
Basophils Absolute: 0 10*3/uL (ref 0.0–0.1)
Basophils Relative: 0 %
Eosinophils Absolute: 0 10*3/uL (ref 0.0–0.5)
Eosinophils Relative: 0 %
HCT: 39.6 % (ref 39.0–52.0)
Hemoglobin: 13 g/dL (ref 13.0–17.0)
Immature Granulocytes: 0 %
Lymphocytes Relative: 3 %
Lymphs Abs: 0.6 10*3/uL — ABNORMAL LOW (ref 0.7–4.0)
MCH: 29.3 pg (ref 26.0–34.0)
MCHC: 32.8 g/dL (ref 30.0–36.0)
MCV: 89.2 fL (ref 80.0–100.0)
Monocytes Absolute: 0.6 10*3/uL (ref 0.1–1.0)
Monocytes Relative: 3 %
Neutro Abs: 16.9 10*3/uL — ABNORMAL HIGH (ref 1.7–7.7)
Neutrophils Relative %: 94 %
Platelets: 270 10*3/uL (ref 150–400)
RBC: 4.44 MIL/uL (ref 4.22–5.81)
RDW: 13.6 % (ref 11.5–15.5)
WBC: 18.2 10*3/uL — ABNORMAL HIGH (ref 4.0–10.5)
nRBC: 0 % (ref 0.0–0.2)

## 2023-09-30 LAB — BASIC METABOLIC PANEL
Anion gap: 11 (ref 5–15)
BUN: 34 mg/dL — ABNORMAL HIGH (ref 8–23)
CO2: 30 mmol/L (ref 22–32)
Calcium: 8.7 mg/dL — ABNORMAL LOW (ref 8.9–10.3)
Chloride: 94 mmol/L — ABNORMAL LOW (ref 98–111)
Creatinine, Ser: 1.2 mg/dL (ref 0.61–1.24)
GFR, Estimated: >60 mL/min (ref >60)
Glucose, Bld: 261 mg/dL — ABNORMAL HIGH (ref 70–99)
Potassium: 4.4 mmol/L (ref 3.5–5.1)
Sodium: 135 mmol/L (ref 135–145)

## 2023-09-30 LAB — TROPONIN I (HIGH SENSITIVITY)
Troponin I (High Sensitivity): 87 ng/L — ABNORMAL HIGH (ref <18)
Troponin I (High Sensitivity): 79 ng/L — ABNORMAL HIGH (ref <18)

## 2023-09-30 LAB — GLUCOSE, CAPILLARY
Glucose-Capillary: 301 mg/dL — ABNORMAL HIGH (ref 70–99)
Glucose-Capillary: 291 mg/dL — ABNORMAL HIGH (ref 70–99)
Glucose-Capillary: 164 mg/dL — ABNORMAL HIGH (ref 70–99)
Glucose-Capillary: 169 mg/dL — ABNORMAL HIGH (ref 70–99)

## 2023-09-30 MED ORDER — HYDROCORTISONE 1 % EX LOTN
TOPICAL_LOTION | Freq: Three times a day (TID) | CUTANEOUS | Status: DC | PRN
  Filled 2023-09-30: qty 118

## 2023-09-30 NOTE — Progress Notes (Signed)
 Progress Note    Cody Alexander  FMW:[id number] DOB: [birth year]  DOA: 09/28/2023 PCP: [facility]      Brief Narrative:    Medical records reviewed and are as summarized below:  Cody Alexander is a 68 y.o. male with medical history significant for Diastolic CHF, COPD on 3-4 L O2, hypertension, CAD with stent, DM on insulin , OSA, morbid obesity, with several past hospitalizations for worsening respiratory failure secondary to COPD/CHF exacerbation, most recently 1/15 to 09/13/2023 at [facility], with history of intubation in 12/2022 for up to a week.  She presented to the hospital bloodstained sputum, shortness of breath and general weakness.  He also reported bloody urine and he had recently been evaluated for this while he was hospitalized at [facility] in January 2025.  Outpatient follow-up with urologist was recommended at that time.          Assessment/Plan:   Principal Problem:   COPD with acute exacerbation (HCC) Active Problems:   Acute on chronic respiratory failure with hypoxia and hypercapnia (HCC)   Hematuria   CAD with history of stent angioplasty   CHF (congestive heart failure), NYHA class I, acute, diastolic (HCC)   OSA (obstructive sleep apnea)   Diabetes mellitus without complication (HCC)   Chronic back pain    Body mass index is 39.05 kg/m.  (Morbid obesity)   Acute on chronic respiratory failure with hypoxia and hypercapnia (HCC) COPD exacerbation OSA Continue prednisone  and bronchodilators. Continue Tussionex as needed for cough, flutter valve CPAP nightly Continue 2 L/min oxygen.  He uses 3 L/min oxygen at baseline.     Intermittent hematuria He was evaluated by Dr. Penne, urologist, on 09/29/2023.  Outpatient follow-up with urologist was recommended.  Hematuria has improved.   -CT urogram on 09/11/2023 at [facility] showed  1. Similar-appearing diffuse bladder wall thickening, scattered bladder diverticulum, and pericystic fat  stranding, likely in the setting of cystitis. If there is clinical concern for underlying bladder malignancy, recommend cystoscopy.  2. No acute pathologies in the bilateral kidneys or ureters.     CAD with history of stent angioplasty Elevated troponin Mildly elevated troponin which is likely from demand ischemia No chest pain. Continue aspirin , atorvastatin  and metoprolol , NTG SL as needed chest pain    Chronic back pain, general weakness Analgesics as needed for pain. PT recommended home health therapy   Type 2 diabetes mellitus with hyperglycemia Continue insulin  glargine 30 units twice daily. Increase NovoLog  to 12 units 3 times daily with meals Continue correctional sliding scale insulin . Home regimen: Insulin  glargine 20 units twice daily, NovoLog  10 units 3 times daily with meals, Farxiga  10 mg daily and Jardiance 25 mg daily   Acute on chronic diastolic CHF  S/p treatment with IV Lasix . Continue metoprolol , Farxiga  and torsemide  2D echo on 09/11/2023 showed EF estimated at greater than 55%, grade 1 diastolic dysfunction,    Left leg pain, bilateral leg swelling Venous duplex of bilateral lower extremities negative for DVT.           Diet Order             Diet heart healthy/carb modified Room service appropriate? Yes; Fluid consistency: Thin  Diet effective now                            Consultants: None  Procedures: None    Medications:    acetaminophen   1,000 mg Oral Once   aspirin   EC  81 mg Oral Daily   atorvastatin   80 mg Oral Daily   dapagliflozin  propanediol  10 mg Oral Q breakfast   enoxaparin  (LOVENOX ) injection  0.5 mg/kg Subcutaneous Q24H   guaiFENesin   600 mg Oral BID   insulin  aspart  0-15 Units Subcutaneous TID WC   insulin  aspart  0-5 Units Subcutaneous QHS   insulin  aspart  10 Units Subcutaneous TID WC   insulin  glargine-yfgn  30 Units Subcutaneous BID   ipratropium-albuterol   3 mL Nebulization Q6H   metoprolol   succinate  25 mg Oral Daily   predniSONE   40 mg Oral Q breakfast   torsemide   40 mg Oral Daily   Continuous Infusions:   Anti-infectives (From admission, onward)    None              Family Communication/Anticipated D/C date and plan/Code Status   DVT prophylaxis:      Code Status: Full Code  Family Communication: None Disposition Plan: Plan to discharge home   Status is: Inpatient Remains inpatient appropriate because: COPD exacerbation       Subjective:   Interval events noted.  He complains of cough and shortness of breath.  He said he coughed repeatedly and passed out from coughing while he was in the ED yesterday.  He requested a bigger bottle of Tussionex cough syrup at discharge  Objective:    Vitals:   09/30/23 0930 09/30/23 0944 09/30/23 0946 09/30/23 1323  BP:      Pulse:      Resp:      Temp:      TempSrc:      SpO2: (!) 85% (!) 80% 95% 96%  Weight:      Height:       Orthostatic VS for the past 24 hrs:  BP- Sitting Pulse- Sitting BP- Standing at 0 minutes Pulse- Standing at 0 minutes  09/30/23 0930 128/72 64 131/81 93     Intake/Output Summary (Last 24 hours) at 09/30/2023 1410 Last data filed at 09/30/2023 1130 Gross per 24 hour  Intake 720 ml  Output 2750 ml  Net -2030 ml   Filed Weights   09/28/23 1804 09/29/23 2033  Weight: 136.1 kg 134.3 kg    Exam:  GEN: NAD SKIN: Warm and dry EYES: No pallor or icterus ENT: MMM CV: RRR PULM: Bilateral rhonchi and wheezing ABD: soft, obese, NT, +BS CNS: AAO x 3, non focal EXT: Trace bilateral leg edema, mild left leg tenderness        Data Reviewed:   I have personally reviewed following labs and imaging studies:  Labs: Labs show the following:   Basic Metabolic Panel: Recent Labs  Lab 09/28/23 1807 09/29/23 0352 09/30/23 0110  NA 135 133* 135  K 4.4 4.9 4.4  CL 95* 93* 94*  CO2 29 31 30   GLUCOSE 156* 493* 261*  BUN 14 21 34*  CREATININE 0.91 1.16 1.20   CALCIUM  9.1 8.3* 8.7*   GFR Estimated Creatinine Clearance: 87.1 mL/min (by C-G formula based on SCr of 1.2 mg/dL). Liver Function Tests: No results for input(s): AST, ALT, ALKPHOS, BILITOT, PROT, ALBUMIN in the last 168 hours. No results for input(s): LIPASE, AMYLASE in the last 168 hours. No results for input(s): AMMONIA in the last 168 hours. Coagulation profile No results for input(s): INR, PROTIME in the last 168 hours.  CBC: Recent Labs  Lab 09/28/23 1807 09/29/23 0352 09/30/23 0110  WBC 17.1* 15.0* 18.2*  NEUTROABS  --   --  16.9*  HGB 15.1 13.2 13.0  HCT 47.5 42.8 39.6  MCV 91.0 94.1 89.2  PLT 270 234 270   Cardiac Enzymes: No results for input(s): CKTOTAL, CKMB, CKMBINDEX, TROPONINI in the last 168 hours. BNP (last 3 results) No results for input(s): PROBNP in the last 8760 hours. CBG: Recent Labs  Lab 09/29/23 1146 09/29/23 1620 09/29/23 2116 09/30/23 0823 09/30/23 1215  GLUCAP 406* 211* 243* 301* 291*   D-Dimer: No results for input(s): DDIMER in the last 72 hours. Hgb A1c: No results for input(s): HGBA1C in the last 72 hours. Lipid Profile: No results for input(s): CHOL, HDL, LDLCALC, TRIG, CHOLHDL, LDLDIRECT in the last 72 hours. Thyroid function studies: No results for input(s): TSH, T4TOTAL, T3FREE, THYROIDAB in the last 72 hours.  Invalid input(s): FREET3 Anemia work up: No results for input(s): VITAMINB12, FOLATE, FERRITIN, TIBC, IRON, RETICCTPCT in the last 72 hours. Sepsis Labs: Recent Labs  Lab 09/28/23 1807 09/28/23 1928 09/28/23 2343 09/29/23 0352 09/30/23 0110  WBC 17.1*  --   --  15.0* 18.2*  LATICACIDVEN  --  0.9 1.0  --   --     Microbiology Recent Results (from the past 240 hours)  Blood culture (routine x 2)     Status: None (Preliminary result)   Collection Time: 09/28/23  7:28 PM   Specimen: BLOOD  Result Value Ref Range Status   Specimen  Description BLOOD BLOOD LEFT HAND  Final   Special Requests   Final    BOTTLES DRAWN AEROBIC AND ANAEROBIC Blood Culture results may not be optimal due to an inadequate volume of blood received in culture bottles   Culture   Final    NO GROWTH 2 DAYS Performed at [facility], [address], [place], [place] [address]    Report Status PENDING  Incomplete  Blood culture (routine x 2)     Status: None (Preliminary result)   Collection Time: 09/28/23  7:28 PM   Specimen: BLOOD  Result Value Ref Range Status   Specimen Description BLOOD BLOOD RIGHT HAND  Final   Special Requests   Final    BOTTLES DRAWN AEROBIC AND ANAEROBIC Blood Culture adequate volume   Culture   Final    NO GROWTH 2 DAYS Performed at [facility], [address] [place], [place] [address]    Report Status PENDING  Incomplete    Procedures and diagnostic studies:  US  Venous Img Lower Bilateral (DVT) Result Date: 09/29/2023 CLINICAL DATA:  [id number] Leg pain, bilateral [id number] EXAM: BILATERAL LOWER EXTREMITY VENOUS DOPPLER ULTRASOUND TECHNIQUE: Gray-scale sonography with graded compression, as well as color Doppler and duplex ultrasound were performed to evaluate the lower extremity deep venous systems from the level of the common femoral vein and including the common femoral, femoral, profunda femoral, popliteal and calf veins including the posterior tibial, peroneal and gastrocnemius veins when visible. The superficial great saphenous vein was also interrogated. Spectral Doppler was utilized to evaluate flow at rest and with distal augmentation maneuvers in the common femoral, femoral and popliteal veins. COMPARISON:  RIGHT lower extremity XRs, 07/28/2023. CT AP, 01/05/2023. FINDINGS: RIGHT LOWER EXTREMITY VENOUS Normal compressibility of the RIGHT common femoral, superficial femoral, and popliteal veins, as well as the visualized calf veins. Visualized portions of profunda femoral vein and great saphenous  vein unremarkable. No filling defects to suggest DVT on grayscale or color Doppler imaging. Doppler waveforms show normal direction of venous flow, normal respiratory plasticity and response to augmentation. OTHER No evidence  of superficial thrombophlebitis or abnormal fluid collection. Limitations: none LEFT LOWER EXTREMITY VENOUS Normal compressibility of the LEFT common femoral, superficial femoral, and popliteal veins, as well as the visualized calf veins. Visualized portions of profunda femoral vein and great saphenous vein unremarkable. No filling defects to suggest DVT on grayscale or color Doppler imaging. Doppler waveforms show normal direction of venous flow, normal respiratory plasticity and response to augmentation. OTHER No evidence of superficial thrombophlebitis or abnormal fluid collection. Limitations: none IMPRESSION: No evidence of femoropopliteal DVT or superficial thrombophlebitis within either lower extremity. Thom Hall, MD [facility] Electronically Signed   By: Thom Hall M.D.   On: 09/29/2023 14:28   DG Chest Port 1 View Result Date: 09/28/2023 CLINICAL DATA:  Shortness of breath EXAM: PORTABLE CHEST 1 VIEW COMPARISON:  07/23/2023 FINDINGS: Cardiomegaly. No acute airspace disease, pleural effusion or pneumothorax. IMPRESSION: No active disease. Cardiomegaly. Electronically Signed   By: Luke Bun M.D.   On: 09/28/2023 19:22               LOS: 2 days   Wilhelmena Zea  [facility]   Pager on [email or web address]. If 7PM-7AM, please contact night-coverage at [email or web address]     09/30/2023, 2:10 PM

## 2023-09-30 NOTE — Progress Notes (Signed)
Patient did not refuse medication, charting done in error.

## 2023-09-30 NOTE — Inpatient Diabetes Management (Signed)
Inpatient Diabetes Program Recommendations  AACE/ADA: New Consensus Statement on Inpatient Glycemic Control (2015)  Target Ranges:  Prepandial:   less than 140 mg/dL      Peak postprandial:   less than 180 mg/dL (1-2 hours)      Critically ill patients:  140 - 180 mg/dL   Lab Results  Component Value Date   GLUCAP 301 (H) 09/30/2023   HGBA1C 10.5 (H) 07/23/2023    Latest Reference Range & Units 09/29/23 07:45 09/29/23 11:46 09/29/23 16:20 09/29/23 21:16 09/30/23 08:23  Glucose-Capillary 70 - 99 mg/dL 045 (H) 409 (H) 811 (H) 243 (H) 301 (H)  (H): Data is abnormally high  Diabetes history: DM2 Outpatient Diabetes medications: Semglee 20 units bid, Novolog 10 units tid meal coverage, Farxiga 10 mg daily, Jardiance 25 mg daily Current orders for Inpatient glycemic control: Semglee 30 units bid, Novolog 10 units tid meal coverage, Novolog 0-15 units tid, 0-5 units hs, Farxiga 10 mg daily, Prednisone 40 mg daily  Inpatient Diabetes Program Recommendations:   Steroids decreased from Solumedrol 40 mg bid to Prednisone 40 mg starting this am. Will follow during hospitalization.  Thank you, Billy Fischer. Holy [place], RN, MSN, CDCES  Diabetes Coordinator Inpatient Glycemic Control Team Team Pager [phone] (8am-5pm) 09/30/2023 11:48 AM

## 2023-09-30 NOTE — Progress Notes (Addendum)
   09/30/23 1615  Spiritual Encounters  Type of Visit Initial  Care provided to: Patient  Conversation partners present during encounter Nurse  Reason for visit Routine spiritual support (Provided Spiritual Resources)  OnCall Visit Yes   Chaplain provided Spiritual Resources (Bible) and prayer, per patient's request.  Rev. Rana M. Nicholaus, MDiv. Chaplain Resident [facility]

## 2023-09-30 NOTE — Evaluation (Signed)
 Occupational Therapy Evaluation Patient Details Name: Cody Alexander MRN: [id number] DOB: 1956/05/11 Today's Date: 09/30/2023   History of Present Illness Cody Alexander is a 34bnF presented to ED with bloody sputum, hematuria, hematemesis, SOB, weakness on 09/28/23. PMH: Diastolic CHF, COPD on 3-4 L O2, HTN, CAD s/p stent, IDDM, OSA, morbid obesity   Clinical Impression   Pt was seen for OT evaluation this date. Pt lives with a 67yo roommate and the roommate's daughter. Pt does not drive but is mod indep with ADL, light meal prep, housekeeping, and endorses difficulty managing his medications and remembering to take them. Pt endorses more falls than I can count in the last 42mo. Pt presents to acute OT demonstrating impaired ADL performance and functional mobility 2/2 significantly impaired safety awareness, awareness of deficits, impulsive with mobility efforts, easily distracted requiring MAX VC to redirect, decreased BLE strength, poor balance, and impaired cardiopulmonary status (See OT problem list for additional functional deficits). Pt found with nasal cannula off at start of session, standing EOB without grippy socks and bed alarm sounding, RN in to address right before OT arrived. Once seated and when asked pt reports he didn't put the Andrews back on after a recent breathing tx (he thinks it was ~39min ago), SpO2 on RA 81%. Pt educated in need to wear O2 and risks of allowing SpO2 to drop. With PLB improves to 93% on 3L. Pt able to don socks using modified figure 4 technique while seated on bed, anticipate MIN A for LB dressing involving STS transfers. Pt impulsive with mobility, declines use of RW and stands with poor balance holding onto the bed rail to transfer to the recliner. Pt educated in AE for LB dressing to improve independence and safety. Pt would benefit from skilled OT services to address noted impairments and functional limitations (see below for any additional details) in order to  maximize safety and independence while minimizing falls risk and caregiver burden.     If plan is discharge home, recommend the following: A little help with walking and/or transfers;A little help with bathing/dressing/bathroom;Help with stairs or ramp for entrance;Assist for transportation;Assistance with cooking/housework;Direct supervision/assist for medications management    Functional Status Assessment  Patient has had a recent decline in their functional status and demonstrates the ability to make significant improvements in function in a reasonable and predictable amount of time.  Equipment Recommendations  Tub/shower seat    Recommendations for Other Services       Precautions / Restrictions Precautions Precautions: Fall Precaution Comments: poor safety awareness Restrictions Weight Bearing Restrictions Per Provider Order: No      Mobility Bed Mobility Overal bed mobility: Needs Assistance Bed Mobility: Supine to Sit     Supine to sit: Supervision, HOB elevated     General bed mobility comments: supv for safety    Transfers Overall transfer level: Needs assistance Equipment used: None Transfers: Sit to/from Stand Sit to Stand: Contact guard assist           General transfer comment: pt declined RW, VC for safety, moves quickly, unsteady      Balance Overall balance assessment: Needs assistance Sitting-balance support: No upper extremity supported, Feet supported Sitting balance-Leahy Scale: Good     Standing balance support: No upper extremity supported, During functional activity Standing balance-Leahy Scale: Poor Standing balance comment: very unsteady and unsafe  ADL either performed or assessed with clinical judgement   ADL Overall ADL's : Needs assistance/impaired                     Lower Body Dressing: Sitting/lateral leans;Supervision/safety Lower Body Dressing Details (indicate cue type and  reason): increased time/effort                     Vision         Perception         Praxis         Pertinent Vitals/Pain Pain Assessment Pain Assessment: 0-10 Pain Score: 7  Pain Location: chronic back pain Pain Descriptors / Indicators: Aching Pain Intervention(s): Monitored during session, Limited activity within patient's tolerance, Premedicated before session, Repositioned     Extremity/Trunk Assessment Upper Extremity Assessment Upper Extremity Assessment: Overall WFL for tasks assessed   Lower Extremity Assessment Lower Extremity Assessment: Generalized weakness;Defer to PT evaluation       Communication Communication Communication: Other (comment) (a bit HOH) Cueing Techniques: Verbal cues;Visual cues;Tactile cues   Cognition Arousal: Alert Behavior During Therapy: Impulsive Overall Cognitive Status: No family/caregiver present to determine baseline cognitive functioning                                 General Comments: Pt alert and oriented, VERY poor safety awareness, hyperverbal requiring MAX VC to redirect     General Comments       Exercises Other Exercises Other Exercises: Pt educated in falls prevention, safety, role of acute OT Other Exercises: Pt found with nasal cannula off and pt reports he didn't put it back on after a recent breathing tx (he thinks it was ~108min ago), SpO2 on RA 81%. Pt educated in need to wear O2 and risks of allowing SpO2 to drop. With PLB improves to 93% on 3L   Shoulder Instructions      Home Living Family/patient expects to be discharged to:: Private residence Living Arrangements: Non-relatives/Friends Available Help at Discharge: Available PRN/intermittently;Friend(s) Type of Home: Mobile home Home Access: Stairs to enter Entrance Stairs-Number of Steps: 4-5 Entrance Stairs-Rails: Left Home Layout: One level     Bathroom Shower/Tub: Chief Strategy Officer: Standard Bathroom  Accessibility: No   Home Equipment: [employer] Consultant (2 wheels);Cane - single point          Prior Functioning/Environment Prior Level of Function : Independent/Modified Independent;History of Falls (last six months);Needs assist             Mobility Comments: Pt reports furniture walking in the home but has a walker and a SPC if needed; endorses more falls than I can count in past 25mo ADLs Comments: Pt reports mod indep with basic ADL but LB dressing is more difficult, endorses not always wearing his O2 at home  (supposed to wear 3L continuous), light meal prep, does NOT drive so has been paying friend to take him places. Uses weekly pill box but does endorse forgetting to take his medications.        OT Problem List: Decreased strength;Cardiopulmonary status limiting activity;Pain;Decreased safety awareness;Decreased activity tolerance;Impaired balance (sitting and/or standing);Decreased knowledge of use of DME or AE;Obesity      OT Treatment/Interventions: Self-care/ADL training;Therapeutic exercise;Therapeutic activities;Energy conservation;DME and/or AE instruction;Patient/family education;Balance training    OT Goals(Current goals can be found in the care plan section) Acute Rehab OT Goals Patient Stated Goal: get  better and move OT Goal Formulation: With patient Time For Goal Achievement: 10/14/23 Potential to Achieve Goals: Good ADL Goals Pt Will Perform Lower Body Dressing: with modified independence;sit to/from stand (AE PRN) Pt Will Transfer to Toilet: with modified independence;ambulating (LRAD) Pt Will Perform Toileting - Clothing Manipulation and hygiene: with modified independence;sit to/from stand Additional ADL Goal #1: Pt will complete morning ADL routine including toileting, dressing, and grooming wiht modified independence, no VC required for safety, 3/3 opportunities.  OT Frequency: Min 1X/week    Co-evaluation              AM-PAC OT 6 Clicks Daily  Activity     Outcome Measure Help from another person eating meals?: None Help from another person taking care of personal grooming?: A Little Help from another person toileting, which includes using toliet, bedpan, or urinal?: A Little Help from another person bathing (including washing, rinsing, drying)?: A Little Help from another person to put on and taking off regular upper body clothing?: None Help from another person to put on and taking off regular lower body clothing?: A Little 6 Click Score: 20   End of Session Equipment Utilized During Treatment: Oxygen  Activity Tolerance: Patient tolerated treatment well Patient left: in chair;with call bell/phone within reach;with chair alarm set  OT Visit Diagnosis: Other abnormalities of gait and mobility (R26.89);Repeated falls (R29.6);Muscle weakness (generalized) (M62.81)                Time: 9155-9094 OT Time Calculation (min): 21 min Charges:  OT General Charges $OT Visit: 1 Visit OT Evaluation $OT Eval Low Complexity: 1 Low OT Treatments $Therapeutic Activity: 8-22 mins  Warren SAUNDERS., MPH, MS, OTR/L ascom [phone] 09/30/23, 10:16 AM

## 2023-09-30 NOTE — Evaluation (Signed)
 Physical Therapy Evaluation Patient Details Name: Cody Alexander MRN: [id number] DOB: Nov 12, 1955 Today's Date: 09/30/2023  History of Present Illness  Cody Alexander is a 34bnF presented to ED with bloody sputum, hematuria, hematemesis, SOB, weakness on 09/28/23. PMH: Diastolic CHF, COPD on 3-4 L O2, HTN, CAD s/p stent, IDDM, OSA, morbid obesity. Pt has been woirking with home health services recently and reports to be making progress overall.  Clinical Impression  Pt in recliner on entry, O2 doffed again despite recent reminder from OT to maintain- pt reports low frustration tolerance and being aggravated by head movement restriction by short tubing. Author adds extension tubing at end of session. SpO2 85% on room air (2-3L use at home). Pt doe snot monitor sats at home, but considering getting one of those things. Pt screened for orthostatic BP, stable from sitting to standing despite brief giddiness. Pt says he has frqeuent syncope at home some of which include (seeing stars), sometimes when standing quickly, or during a prolonged coughing spell, or bearing down on the toilet. Pt AMB ~159ft on room air, limited by DOE, sats drop to 80%, recover to 85% after ~ 90sec, then return to room and 3L/min nasal canula. Pt says he feels less strong in legs and less steady than recent baseline, but he feels he can safely manage mobility at home and continue on with HHPT services.       If plan is discharge home, recommend the following: Direct supervision/assist for medications management;Direct supervision/assist for financial management;A little help with walking and/or transfers;A little help with bathing/dressing/bathroom;Help with stairs or ramp for entrance;Assist for transportation;Assistance with cooking/housework   Can travel by private vehicle        Equipment Recommendations None recommended by PT  Recommendations for Other Services       Functional Status Assessment Patient has had a  recent decline in their functional status and demonstrates the ability to make significant improvements in function in a reasonable and predictable amount of time.     Precautions / Restrictions Precautions Precautions: Fall Precaution Comments: poor frustration tolerance + impulsivity = frequent noncompliant decision making Restrictions Weight Bearing Restrictions Per Provider Order: No      Mobility  Bed Mobility               General bed mobility comments: not performed    Transfers Overall transfer level: Needs assistance Equipment used: None Transfers: Sit to/from Stand Sit to Stand: Supervision           General transfer comment: rises to standing for orthostatic vitals, no assist, giddy x1 minute with resolution before BP is finished    Ambulation/Gait   Gait Distance (Feet): 60 Feet (81ft to NSG desk, then stops to recovery breathing x2 minutes, then 35ft back to room) Assistive device: None Gait Pattern/deviations: Staggering right, Staggering left, Drifts right/left Gait velocity: apropros for household AMB     General Gait Details: AMB on room air as desired: 80% SPO2 [place] where he stops x 2 minuts to regain breathing toi 85% prior to return to room  Stairs            Wheelchair Mobility     Tilt Bed    Modified Rankin (Stroke Patients Only)       Balance Overall balance assessment: History of Falls, Modified Independent, Mild deficits observed, not formally tested (pt reports frequent issues with syncope related with some of these falls)  Pertinent Vitals/Pain Pain Assessment Pain Assessment: Faces Faces Pain Scale: Hurts even more Pain Location: lumbar area back pain has been aggravated recently after several falls preadmission Pain Descriptors / Indicators: Aching Pain Intervention(s): Limited activity within patient's tolerance, Monitored during session, Premedicated  before session    Home Living Family/patient expects to be discharged to:: Private residence Living Arrangements: Non-relatives/Friends Available Help at Discharge: Available PRN/intermittently;Friend(s) Type of Home: Mobile home Home Access: Stairs to enter Entrance Stairs-Rails: Left Entrance Stairs-Number of Steps: 4-5   Home Layout: One level Home Equipment: Agricultural Consultant (2 wheels);Cane - single point Additional Comments: floor concentrator, and porttable concentrator with battery that often dies during [employer] outings    Prior Function Prior Level of Function : Independent/Modified Independent;History of Falls (last six months);Needs assist             Mobility Comments: Pt reports furniture walking in the home but has a walker and a SPC if needed; endorses more falls than I can count in past 60mo ADLs Comments: Pt reports mod indep with basic ADL but LB dressing is more difficult, endorses not always wearing his O2 at home  (supposed to wear 3L continuous), light meal prep, does NOT drive so has been paying friend to take him places. Uses weekly pill box but does endorse forgetting to take his medications.     Extremity/Trunk Assessment   Upper Extremity Assessment Upper Extremity Assessment: Overall WFL for tasks assessed    Lower Extremity Assessment Lower Extremity Assessment: Generalized weakness;Defer to PT evaluation       Communication   Communication Communication: Other (comment) (a bit HOH) Cueing Techniques: Verbal cues;Tactile cues;Gestural cues  Cognition Arousal: Alert Behavior During Therapy: Impulsive, Anxious                                   General Comments: very social, tangetial, difficult to keep in topic somtimes        General Comments      Exercises     Assessment/Plan    PT Assessment Patient needs continued PT services  PT Problem List Decreased strength;Decreased range of motion;Decreased activity  tolerance;Decreased balance;Decreased mobility;Decreased coordination;Decreased cognition;Obesity       PT Treatment Interventions DME instruction;Gait training;Stair training;Functional mobility training;Therapeutic activities;Therapeutic exercise;Balance training;Neuromuscular re-education;Patient/family education    PT Goals (Current goals can be found in the Care Plan section)  Acute Rehab PT Goals Patient Stated Goal: regain steadyiness, stop falling all the time PT Goal Formulation: With patient Time For Goal Achievement: 10/14/23 Potential to Achieve Goals: Fair    Frequency Min 1X/week     Co-evaluation               AM-PAC PT 6 Clicks Mobility  Outcome Measure Help needed turning from your back to your side while in a flat bed without using bedrails?: A Lot Help needed moving from lying on your back to sitting on the side of a flat bed without using bedrails?: A Lot Help needed moving to and from a bed to a chair (including a wheelchair)?: A Little Help needed standing up from a chair using your arms (e.g., wheelchair or bedside chair)?: A Little Help needed to walk in hospital room?: A Little Help needed climbing 3-5 steps with a railing? : A Little 6 Click Score: 16    End of Session Equipment Utilized During Treatment: Gait belt;Oxygen Activity Tolerance: Patient tolerated treatment well;Patient limited  by fatigue Patient left: in chair;with call bell/phone within reach;with chair alarm set Nurse Communication: Mobility status PT Visit Diagnosis: Unsteadiness on feet (R26.81);Other abnormalities of gait and mobility (R26.89);Repeated falls (R29.6);Muscle weakness (generalized) (M62.81)    Time: 9073-9053 PT Time Calculation (min) (ACUTE ONLY): 20 min   Charges:   PT Evaluation $PT Eval Moderate Complexity: 1 Mod PT Treatments $Therapeutic Activity: 8-22 mins PT General Charges $$ ACUTE PT VISIT: 1 Visit        11:34 AM, 09/30/23 Cody Alexander,  PT, DPT Physical Therapist - [facility]  [phone] (ASCOM)     Cody Alexander 09/30/2023, 11:30 AM

## 2023-09-30 NOTE — Progress Notes (Signed)
  Chaplain On-Call responded to a page at [phone] hours from [employer], who reported the patient's request to speak with the Chaplain.  Chaplain met the patient as he was sitting in a chair beside the bed.  Chaplain offered much supportive listening and normalization of feelings as the patient shared his life story. He spoke about many physical and emotional hardships that he has experienced, and that his faith has sustained him along the way.  Chaplain provided spiritual and emotional support and prayer.  Chaplain Bebe Ardean EMERSON Hershal., [facility]

## 2023-10-01 DIAGNOSIS — J441 Chronic obstructive pulmonary disease with (acute) exacerbation: Secondary | ICD-10-CM | POA: Diagnosis not present

## 2023-10-01 LAB — URINE CULTURE

## 2023-10-01 LAB — CBC WITH DIFFERENTIAL/PLATELET
Abs Immature Granulocytes: 0.07 10*3/uL (ref 0.00–0.07)
Basophils Absolute: 0 10*3/uL (ref 0.0–0.1)
Basophils Relative: 0 %
Eosinophils Absolute: 0 10*3/uL (ref 0.0–0.5)
Eosinophils Relative: 0 %
HCT: 41.1 % (ref 39.0–52.0)
Hemoglobin: 13.2 g/dL (ref 13.0–17.0)
Immature Granulocytes: 1 %
Lymphocytes Relative: 10 %
Lymphs Abs: 1.4 10*3/uL (ref 0.7–4.0)
MCH: 28.9 pg (ref 26.0–34.0)
MCHC: 32.1 g/dL (ref 30.0–36.0)
MCV: 89.9 fL (ref 80.0–100.0)
Monocytes Absolute: 1.6 10*3/uL — ABNORMAL HIGH (ref 0.1–1.0)
Monocytes Relative: 11 %
Neutro Abs: 11.4 10*3/uL — ABNORMAL HIGH (ref 1.7–7.7)
Neutrophils Relative %: 78 %
Platelets: 264 10*3/uL (ref 150–400)
RBC: 4.57 MIL/uL (ref 4.22–5.81)
RDW: 13.7 % (ref 11.5–15.5)
WBC: 14.5 10*3/uL — ABNORMAL HIGH (ref 4.0–10.5)
nRBC: 0 % (ref 0.0–0.2)

## 2023-10-01 LAB — GLUCOSE, CAPILLARY
Glucose-Capillary: 94 mg/dL (ref 70–99)
Glucose-Capillary: 213 mg/dL — ABNORMAL HIGH (ref 70–99)
Glucose-Capillary: 228 mg/dL — ABNORMAL HIGH (ref 70–99)
Glucose-Capillary: 128 mg/dL — ABNORMAL HIGH (ref 70–99)

## 2023-10-01 MED ORDER — HYDROCOD POLI-CHLORPHE POLI ER 10-8 MG/5ML PO SUER
5.0000 mL | Freq: Four times a day (QID) | ORAL | Status: DC | PRN
  Administered 2023-10-01 – 2023-10-03 (×6): 5 mL via ORAL
  Filled 2023-10-01 (×6): qty 5

## 2023-10-01 NOTE — Progress Notes (Signed)
 Physical Therapy Treatment Patient Details Name: Dayveon Halley MRN: [id number] DOB: 01-27-1956 Today's Date: 10/01/2023   History of Present Illness Jasiah Elsen is a 34bnF presented to ED with bloody sputum, hematuria, hematemesis, SOB, weakness on 09/28/23. PMH: Diastolic CHF, COPD on 3-4 L O2, HTN, CAD s/p stent, IDDM, OSA, morbid obesity. Pt has been woirking with home health services recently and reports to be making progress overall.    PT Comments  Pt alert, agreeable to mobility, seated in recliner upon PT entrance. Asked to be on room air, spO2 ~85% after ambulating to bathroom and standing to void. 2L for most of remainder of session but did need 3L after stair navigation. The pt was CGA-supervision for tasks, some cues needed for safety and technique throughout. Pt did reach for unilateral UE support intermittently, and self regulated several standing rest breaks. He was able to navigate stairs with safety cues, endorsed fatigue but no physical assistance required. The patient would benefit from further skilled PT intervention to continue to progress towards goals.    If plan is discharge home, recommend the following: Direct supervision/assist for medications management;Direct supervision/assist for financial management;A little help with walking and/or transfers;A little help with bathing/dressing/bathroom;Help with stairs or ramp for entrance;Assist for transportation;Assistance with cooking/housework   Can travel by private vehicle        Equipment Recommendations  None recommended by PT    Recommendations for Other Services       Precautions / Restrictions Precautions Precautions: Fall Restrictions Weight Bearing Restrictions Per Provider Order: No     Mobility  Bed Mobility               General bed mobility comments: in recliner upon PT entrance    Transfers Overall transfer level: Needs assistance Equipment used: None Transfers: Sit to/from  Stand Sit to Stand: Supervision                Ambulation/Gait Ambulation/Gait assistance: Supervision Gait Distance (Feet): 220 Feet Assistive device: None Gait Pattern/deviations: Staggering right, Staggering left, Drifts right/left       General Gait Details: pt intermittently uses at least unilateral support with some staggering and multiple standing rest breaks (pt led)   Stairs Stairs: Yes Stairs assistance: Contact guard assist Stair Management: One rail Right, Two rails, One rail Left, Step to pattern Number of Stairs: 6 General stair comments: pt encouraged to perform with rails similiar to home set up with fair carryover. CGA, no LOB but pt did endorse fatigue   Wheelchair Mobility     Tilt Bed    Modified Rankin (Stroke Patients Only)       Balance Overall balance assessment: Needs assistance Sitting-balance support: Feet supported Sitting balance-Leahy Scale: Good     Standing balance support: During functional activity Standing balance-Leahy Scale: Fair Standing balance comment: intermittent use of unilateral UE support while walking                            Cognition Arousal: Alert Behavior During Therapy: Impulsive Overall Cognitive Status: No family/caregiver present to determine baseline cognitive functioning                                          Exercises      General Comments        Pertinent Vitals/Pain Pain Assessment Pain Assessment:  Faces Faces Pain Scale: Hurts a little bit Pain Location: lumbar area back pain Pain Descriptors / Indicators: Aching, Grimacing Pain Intervention(s): Limited activity within patient's tolerance    Home Living                          Prior Function            PT Goals (current goals can now be found in the care plan section) Progress towards PT goals: Progressing toward goals    Frequency    Min 1X/week      PT Plan       Co-evaluation              AM-PAC PT 6 Clicks Mobility   Outcome Measure  Help needed turning from your back to your side while in a flat bed without using bedrails?: A Little Help needed moving from lying on your back to sitting on the side of a flat bed without using bedrails?: A Little Help needed moving to and from a bed to a chair (including a wheelchair)?: A Little Help needed standing up from a chair using your arms (e.g., wheelchair or bedside chair)?: A Little Help needed to walk in hospital room?: A Little Help needed climbing 3-5 steps with a railing? : A Little 6 Click Score: 18    End of Session Equipment Utilized During Treatment: Gait belt;Oxygen Activity Tolerance: Patient tolerated treatment well;Patient limited by fatigue Patient left: in chair;with call bell/phone within reach;with chair alarm set Nurse Communication: Mobility status PT Visit Diagnosis: Unsteadiness on feet (R26.81);Other abnormalities of gait and mobility (R26.89);Repeated falls (R29.6);Muscle weakness (generalized) (M62.81)     Time: 8890-8872 PT Time Calculation (min) (ACUTE ONLY): 18 min  Charges:    $Therapeutic Activity: 8-22 mins PT General Charges $$ ACUTE PT VISIT: 1 Visit                     Doyal Shams PT, DPT 11:50 AM,10/01/23

## 2023-10-01 NOTE — Progress Notes (Signed)
 PROGRESS NOTE    Cody Alexander  FMW:[id number] DOB: [birth year] DOA: 09/28/2023 PCP: [facility]    Brief Narrative:   Cody Alexander is a 68 y.o. male with medical history significant for Diastolic CHF, COPD on 3-4 L O2, hypertension, CAD with stent, DM on insulin , OSA, morbid obesity, with several past hospitalizations for worsening respiratory failure secondary to COPD/CHF exacerbation, most recently 1/15 to 09/13/2023 at [facility], with history of intubation in 12/2022 for up to a week.  She presented to the hospital bloodstained sputum, shortness of breath and general weakness.  He also reported bloody urine and he had recently been evaluated for this while he was hospitalized at [facility] in January 2025.  Outpatient follow-up with urologist was recommended at that time.    Assessment & Plan:   Principal Problem:   COPD with acute exacerbation (HCC) Active Problems:   Acute on chronic respiratory failure with hypoxia and hypercapnia (HCC)   Hematuria   CAD with history of stent angioplasty   CHF (congestive heart failure), NYHA class I, acute, diastolic (HCC)   OSA (obstructive sleep apnea)   Diabetes mellitus without complication (HCC)   Chronic back pain  Acute on chronic respiratory failure with hypoxia and hypercapnia (HCC) COPD exacerbation OSA Patient with longstanding history of COPD and frequent admissions for exacerbations. Plan: Continue prednisone  Bronchodilators Tussionex every 6 hours as needed Admitted up to 6 hours CPAP nightly Supplemental oxygen as needed   Intermittent hematuria He was evaluated by Dr. Penne, urologist, on 09/29/2023.  Outpatient follow-up with urologist was recommended.  Hematuria has improved.   -CT urogram on 09/11/2023 at [facility] showed  1. Similar-appearing diffuse bladder wall thickening, scattered bladder diverticulum, and pericystic fat stranding, likely in the setting of cystitis. If there is clinical concern for underlying  bladder malignancy, recommend cystoscopy.  2. No acute pathologies in the bilateral kidneys or ureters.      CAD with history of stent angioplasty Elevated troponin Mildly elevated troponin which is likely from demand ischemia No chest pain. Continue aspirin , atorvastatin  and metoprolol , NTG SL as needed chest pain     Chronic back pain, general weakness Analgesics as needed for pain. PT recommended home health therapy     Type 2 diabetes mellitus with hyperglycemia Continue insulin  glargine 30 units twice daily. Increase NovoLog  to 12 units 3 times daily with meals Continue correctional sliding scale insulin . Home regimen: Insulin  glargine 20 units twice daily, NovoLog  10 units 3 times daily with meals, Farxiga  10 mg daily and Jardiance 25 mg daily     Acute on chronic diastolic CHF  S/p treatment with IV Lasix . Continue metoprolol , Farxiga  and torsemide  2D echo on 09/11/2023 showed EF estimated at greater than 55%, grade 1 diastolic dysfunction,      Left leg pain, bilateral leg swelling Venous duplex of bilateral lower extremities negative for DVT.  Obesity BMI 39.  Complicates overall care prognosis.    DVT prophylaxis: Lovenox  Code Status: Full Family Communication: None Disposition Plan: Status is: Inpatient Remains inpatient appropriate because: Decompensated COPD   Level of care: Telemetry Medical  Consultants:  None  Procedures:  None  Antimicrobials: None   Subjective: Seen and examined.  Sitting up in chair.  Still coughing with audible wheeze.  Objective: Vitals:   09/30/23 2113 09/30/23 2148 10/01/23 0313 10/01/23 0842  BP: (!) 136/93   108/79  Pulse: 75   (!) 103  Resp: 18   16  Temp: (!) 97.5 F (36.4 C)  98.3 F (36.8 C)  TempSrc:    Oral  SpO2: 100% 100% 100% 97%  Weight:      Height:        Intake/Output Summary (Last 24 hours) at 10/01/2023 1403 Last data filed at 10/01/2023 1121 Gross per 24 hour  Intake 1680 ml  Output  2050 ml  Net -370 ml   Filed Weights   09/28/23 1804 09/29/23 2033  Weight: 136.1 kg 134.3 kg    Examination:  General exam: Appears calm and comfortable  Respiratory system: Scattered crackles bilaterally.  Equal air entry.  End expiratory wheeze.  2 L  Cardiovascular system: S1-S2, RRR, no murmurs, no pedal edema Gastrointestinal system: Obese, soft, NT/ND, normal bowel sounds Central nervous system: Alert and oriented. No focal neurological deficits. Extremities: Symmetric 5 x 5 power. Skin: No rashes, lesions or ulcers Psychiatry: Judgement and insight appear normal. Mood & affect appropriate.     Data Reviewed: I have personally reviewed following labs and imaging studies  CBC: Recent Labs  Lab 09/28/23 1807 09/29/23 0352 09/30/23 0110 10/01/23 0434  WBC 17.1* 15.0* 18.2* 14.5*  NEUTROABS  --   --  16.9* 11.4*  HGB 15.1 13.2 13.0 13.2  HCT 47.5 42.8 39.6 41.1  MCV 91.0 94.1 89.2 89.9  PLT 270 234 270 264   Basic Metabolic Panel: Recent Labs  Lab 09/28/23 1807 09/29/23 0352 09/30/23 0110  NA 135 133* 135  K 4.4 4.9 4.4  CL 95* 93* 94*  CO2 29 31 30   GLUCOSE 156* 493* 261*  BUN 14 21 34*  CREATININE 0.91 1.16 1.20  CALCIUM  9.1 8.3* 8.7*   GFR: Estimated Creatinine Clearance: 87.1 mL/min (by C-G formula based on SCr of 1.2 mg/dL). Liver Function Tests: No results for input(s): AST, ALT, ALKPHOS, BILITOT, PROT, ALBUMIN in the last 168 hours. No results for input(s): LIPASE, AMYLASE in the last 168 hours. No results for input(s): AMMONIA in the last 168 hours. Coagulation Profile: No results for input(s): INR, PROTIME in the last 168 hours. Cardiac Enzymes: No results for input(s): CKTOTAL, CKMB, CKMBINDEX, TROPONINI in the last 168 hours. BNP (last 3 results) No results for input(s): PROBNP in the last 8760 hours. HbA1C: No results for input(s): HGBA1C in the last 72 hours. CBG: Recent Labs  Lab 09/30/23 1215  09/30/23 1738 09/30/23 2109 10/01/23 0757 10/01/23 1145  GLUCAP 291* 164* 169* 94 213*   Lipid Profile: No results for input(s): CHOL, HDL, LDLCALC, TRIG, CHOLHDL, LDLDIRECT in the last 72 hours. Thyroid Function Tests: No results for input(s): TSH, T4TOTAL, FREET4, T3FREE, THYROIDAB in the last 72 hours. Anemia Panel: No results for input(s): VITAMINB12, FOLATE, FERRITIN, TIBC, IRON, RETICCTPCT in the last 72 hours. Sepsis Labs: Recent Labs  Lab 09/28/23 1928 09/28/23 2343  LATICACIDVEN 0.9 1.0    Recent Results (from the past 240 hours)  Blood culture (routine x 2)     Status: None (Preliminary result)   Collection Time: 09/28/23  7:28 PM   Specimen: BLOOD  Result Value Ref Range Status   Specimen Description BLOOD BLOOD LEFT HAND  Final   Special Requests   Final    BOTTLES DRAWN AEROBIC AND ANAEROBIC Blood Culture results may not be optimal due to an inadequate volume of blood received in culture bottles   Culture   Final    NO GROWTH 3 DAYS Performed at [facility], [address], [place], [place] [address]    Report Status PENDING  Incomplete  Blood culture (  routine x 2)     Status: None (Preliminary result)   Collection Time: 09/28/23  7:28 PM   Specimen: BLOOD  Result Value Ref Range Status   Specimen Description BLOOD BLOOD RIGHT HAND  Final   Special Requests   Final    BOTTLES DRAWN AEROBIC AND ANAEROBIC Blood Culture adequate volume   Culture   Final    NO GROWTH 3 DAYS Performed at [facility], [address] [place], [place] [address]    Report Status PENDING  Incomplete  Urine Culture     Status: Abnormal   Collection Time: 09/29/23  1:21 PM   Specimen: Urine, Random  Result Value Ref Range Status   Specimen Description   Final    URINE, RANDOM Performed at [facility], [address], [place], [place] [address]    Special Requests   Final    NONE Reflexed from [id number] Performed  at [facility], [address], [place], [place] [address]    Culture MULTIPLE SPECIES PRESENT, SUGGEST RECOLLECTION (A)  Final   Report Status 10/01/2023 FINAL  Final         Radiology Studies: US  Venous Img Lower Bilateral (DVT) Result Date: 09/29/2023 CLINICAL DATA:  [id number] Leg pain, bilateral [id number] EXAM: BILATERAL LOWER EXTREMITY VENOUS DOPPLER ULTRASOUND TECHNIQUE: Gray-scale sonography with graded compression, as well as color Doppler and duplex ultrasound were performed to evaluate the lower extremity deep venous systems from the level of the common femoral vein and including the common femoral, femoral, profunda femoral, popliteal and calf veins including the posterior tibial, peroneal and gastrocnemius veins when visible. The superficial great saphenous vein was also interrogated. Spectral Doppler was utilized to evaluate flow at rest and with distal augmentation maneuvers in the common femoral, femoral and popliteal veins. COMPARISON:  RIGHT lower extremity XRs, 07/28/2023. CT AP, 01/05/2023. FINDINGS: RIGHT LOWER EXTREMITY VENOUS Normal compressibility of the RIGHT common femoral, superficial femoral, and popliteal veins, as well as the visualized calf veins. Visualized portions of profunda femoral vein and great saphenous vein unremarkable. No filling defects to suggest DVT on grayscale or color Doppler imaging. Doppler waveforms show normal direction of venous flow, normal respiratory plasticity and response to augmentation. OTHER No evidence of superficial thrombophlebitis or abnormal fluid collection. Limitations: none LEFT LOWER EXTREMITY VENOUS Normal compressibility of the LEFT common femoral, superficial femoral, and popliteal veins, as well as the visualized calf veins. Visualized portions of profunda femoral vein and great saphenous vein unremarkable. No filling defects to suggest DVT on grayscale or color Doppler imaging. Doppler waveforms show normal direction of venous  flow, normal respiratory plasticity and response to augmentation. OTHER No evidence of superficial thrombophlebitis or abnormal fluid collection. Limitations: none IMPRESSION: No evidence of femoropopliteal DVT or superficial thrombophlebitis within either lower extremity. Thom Hall, MD [facility] Radiology Electronically Signed   By: Thom Hall M.D.   On: 09/29/2023 14:28        Scheduled Meds:  acetaminophen   1,000 mg Oral Once   aspirin  EC  81 mg Oral Daily   atorvastatin   80 mg Oral Daily   dapagliflozin  propanediol  10 mg Oral Q breakfast   enoxaparin  (LOVENOX ) injection  0.5 mg/kg Subcutaneous Q24H   guaiFENesin   600 mg Oral BID   insulin  aspart  0-15 Units Subcutaneous TID WC   insulin  aspart  0-5 Units Subcutaneous QHS   insulin  aspart  10 Units Subcutaneous TID WC   insulin  glargine-yfgn  30  Units Subcutaneous BID   ipratropium-albuterol   3 mL Nebulization Q6H   metoprolol  succinate  25 mg Oral Daily   predniSONE   40 mg Oral Q breakfast   torsemide   40 mg Oral Daily   Continuous Infusions:   LOS: 3 days     Cody KATHEE Robson, MD [facility]   If 7PM-7AM, please contact night-coverage  10/01/2023, 2:03 PM

## 2023-10-01 NOTE — Progress Notes (Signed)
 Patient has refused safety alarms being turned on in the chair and in bed, noted pt independently ambulating to bathroom without issues. Pt educated on safety measures and to call when he feels unsteady, call bell within reach.

## 2023-10-01 NOTE — Care Management Important Message (Signed)
 Important Message  Patient Details  Name: Cody Alexander MRN: [id number] Date of Birth: 11/15/1955   Important Message Given:  Yes - Medicare IM     Aissa Lisowski W, CMA 10/01/2023, 1:45 PM

## 2023-10-01 NOTE — Plan of Care (Signed)
  Problem: Education: Goal: Ability to describe self-care measures that may prevent or decrease complications (Diabetes Survival Skills Education) will improve Outcome: Not Applicable   Problem: Coping: Goal: Ability to adjust to condition or change in health will improve Outcome: Progressing   Problem: Fluid Volume: Goal: Ability to maintain a balanced intake and output will improve Outcome: Not Progressing   Problem: Nutritional: Goal: Maintenance of adequate nutrition will improve Outcome: Adequate for Discharge Goal: Progress toward achieving an optimal weight will improve Outcome: Not Progressing

## 2023-10-02 DIAGNOSIS — J441 Chronic obstructive pulmonary disease with (acute) exacerbation: Secondary | ICD-10-CM | POA: Diagnosis not present

## 2023-10-02 LAB — GLUCOSE, CAPILLARY
Glucose-Capillary: 68 mg/dL — ABNORMAL LOW (ref 70–99)
Glucose-Capillary: 116 mg/dL — ABNORMAL HIGH (ref 70–99)
Glucose-Capillary: 200 mg/dL — ABNORMAL HIGH (ref 70–99)
Glucose-Capillary: 328 mg/dL — ABNORMAL HIGH (ref 70–99)
Glucose-Capillary: 212 mg/dL — ABNORMAL HIGH (ref 70–99)

## 2023-10-02 MED ORDER — INSULIN GLARGINE-YFGN 100 UNIT/ML ~~LOC~~ SOLN
25.0000 [IU] | Freq: Two times a day (BID) | SUBCUTANEOUS | Status: DC
  Administered 2023-10-02 – 2023-10-03 (×3): 25 [IU] via SUBCUTANEOUS
  Filled 2023-10-02 (×4): qty 0.25

## 2023-10-02 NOTE — Plan of Care (Signed)
  Problem: Coping: Goal: Ability to adjust to condition or change in health will improve Outcome: Progressing   Problem: Nutritional: Goal: Maintenance of adequate nutrition will improve Outcome: Progressing   Problem: Clinical Measurements: Goal: Diagnostic test results will improve Outcome: Progressing   Problem: Activity: Goal: Risk for activity intolerance will decrease Outcome: Progressing   Problem: Safety: Goal: Ability to remain free from injury will improve Outcome: Progressing   Problem: Respiratory: Goal: Levels of oxygenation will improve Outcome: Progressing

## 2023-10-02 NOTE — Inpatient Diabetes Management (Signed)
 Inpatient Diabetes Program Recommendations  AACE/ADA: New Consensus Statement on Inpatient Glycemic Control (2015)  Target Ranges:  Prepandial:   less than 140 mg/dL      Peak postprandial:   less than 180 mg/dL (1-2 hours)      Critically ill patients:  140 - 180 mg/dL    Latest Reference Range & Units 10/01/23 07:57 10/01/23 11:45 10/01/23 16:26 10/01/23 21:38  Glucose-Capillary 70 - 99 mg/dL 94  10 units Novolog   30 units Semglee   213 (H)  15 units Novolog   228 (H)  15 units Novolog   128 (H)    30 units Semglee   (H): Data is abnormally high  Latest Reference Range & Units 10/02/23 08:13  Glucose-Capillary 70 - 99 mg/dL 68 (L)  (L): Data is abnormally low     Home DM Meds: Semglee  20 units bid     Novolog  10 units tid meal coverage     Farxiga  10 mg daily       Current Orders: Semglee  30 units BID     Novolog  Moderate Correction Scale/ SSI (0-15 units) TID AC + HS     Novolog  10 units TID with meals     Farxiga  10 mg daily     MD- Note pt getting Prednisone  40 mg daily.  Mild Hypoglycemia this AM--Getting more Basal insulin  than he takes at home  Please consider:  1. Reduce the Semglee  to 25 units BID  2. Increase the Novolog  Meal Coverage slightly to 12 units TID with meals     --Will follow patient during hospitalization--  Adina Rudolpho Arrow RN, MSN, CDCES Diabetes Coordinator Inpatient Glycemic Control Team Team Pager: [phone] (8a-5p)

## 2023-10-02 NOTE — Progress Notes (Signed)
 PROGRESS NOTE    Cody Alexander  FMW:[id number] DOB: Sep 03, 1955 DOA: 09/28/2023 PCP: [facility]    Brief Narrative:   Cody Alexander is a 68 y.o. male with medical history significant for Diastolic CHF, COPD on 3-4 L O2, hypertension, CAD with stent, DM on insulin , OSA, morbid obesity, with several past hospitalizations for worsening respiratory failure secondary to COPD/CHF exacerbation, most recently 1/15 to 09/13/2023 at [facility], with history of intubation in 12/2022 for up to a week.  She presented to the hospital bloodstained sputum, shortness of breath and general weakness.  He also reported bloody urine and he had recently been evaluated for this while he was hospitalized at [facility] in January 2025.  Outpatient follow-up with urologist was recommended at that time.    Assessment & Plan:   Principal Problem:   COPD with acute exacerbation (HCC) Active Problems:   Acute on chronic respiratory failure with hypoxia and hypercapnia (HCC)   Hematuria   CAD with history of stent angioplasty   CHF (congestive heart failure), NYHA class I, acute, diastolic (HCC)   OSA (obstructive sleep apnea)   Diabetes mellitus without complication (HCC)   Chronic back pain  Acute on chronic respiratory failure with hypoxia and hypercapnia (HCC) COPD exacerbation OSA Patient with longstanding history of COPD and frequent admissions for exacerbations. Plan: Continue prednisone  40 mg daily Bronchodilators Tussionex every 6 hours as needed MetaNeb every 6 hours CPAP nightly Supplemental oxygen as needed   Intermittent hematuria He was evaluated by Dr. Penne, urologist, on 09/29/2023.  Outpatient follow-up with urologist was recommended.  Hematuria has improved.  Will refer to outpatient urology at time of discharge -CT urogram on 09/11/2023 at [facility] showed  1. Similar-appearing diffuse bladder wall thickening, scattered bladder diverticulum, and pericystic fat stranding, likely in the  setting of cystitis. If there is clinical concern for underlying bladder malignancy, recommend cystoscopy.  2. No acute pathologies in the bilateral kidneys or ureters.      CAD with history of stent angioplasty Elevated troponin Mildly elevated troponin which is likely from demand ischemia No chest pain. Continue aspirin , atorvastatin  and metoprolol , NTG SL as needed chest pain     Chronic back pain, general weakness Analgesics as needed for pain. PT recommended home health therapy     Type 2 diabetes mellitus with hyperglycemia Continue insulin  glargine 30 units twice daily. Increase NovoLog  to 12 units 3 times daily with meals Continue correctional sliding scale insulin . Home regimen: Insulin  glargine 20 units twice daily, NovoLog  10 units 3 times daily with meals, Farxiga  10 mg daily and Jardiance 25 mg daily     Acute on chronic diastolic CHF  S/p treatment with IV Lasix . Continue metoprolol , Farxiga  and torsemide  2D echo on 09/11/2023 showed EF estimated at greater than 55%, grade 1 diastolic dysfunction,      Left leg pain, bilateral leg swelling Venous duplex of bilateral lower extremities negative for DVT.  Obesity BMI 39.  Complicates overall care prognosis.    DVT prophylaxis: Lovenox  Code Status: Full Family Communication: None Disposition Plan: Status is: Inpatient Remains inpatient appropriate because: Decompensated COPD   Level of care: Telemetry Medical  Consultants:  None  Procedures:  None  Antimicrobials: None   Subjective: Seen and examined.  Lying in bed.  Still with audible wheeze  Objective: Vitals:   10/01/23 2108 10/01/23 2147 10/02/23 0814 10/02/23 0832  BP: (!) 138/97  110/75   Pulse: 72  92   Resp: 20  18  Temp: 98.1 F (36.7 C)  98.3 F (36.8 C)   TempSrc: Oral  Oral   SpO2: 92% 98% 91% 97%  Weight:      Height:        Intake/Output Summary (Last 24 hours) at 10/02/2023 1229 Last data filed at 10/02/2023 1115 Gross  per 24 hour  Intake 240 ml  Output 600 ml  Net -360 ml   Filed Weights   09/28/23 1804 09/29/23 2033  Weight: 136.1 kg 134.3 kg    Examination:  General exam: NAD.  Appears fatigued Respiratory system: Bibasilar crackles.  End expiratory wheeze.  Normal work of breathing.  2 L Cardiovascular system: S1-S2, RRR, no murmurs, no pedal edema Gastrointestinal system: Obese, soft, NT/ND, normal bowel sounds Central nervous system: Alert and oriented. No focal neurological deficits. Extremities: Symmetric 5 x 5 power. Skin: No rashes, lesions or ulcers Psychiatry: Judgement and insight appear normal. Mood & affect appropriate.     Data Reviewed: I have personally reviewed following labs and imaging studies  CBC: Recent Labs  Lab 09/28/23 1807 09/29/23 0352 09/30/23 0110 10/01/23 0434  WBC 17.1* 15.0* 18.2* 14.5*  NEUTROABS  --   --  16.9* 11.4*  HGB 15.1 13.2 13.0 13.2  HCT 47.5 42.8 39.6 41.1  MCV 91.0 94.1 89.2 89.9  PLT 270 234 270 264   Basic Metabolic Panel: Recent Labs  Lab 09/28/23 1807 09/29/23 0352 09/30/23 0110  NA 135 133* 135  K 4.4 4.9 4.4  CL 95* 93* 94*  CO2 29 31 30   GLUCOSE 156* 493* 261*  BUN 14 21 34*  CREATININE 0.91 1.16 1.20  CALCIUM  9.1 8.3* 8.7*   GFR: Estimated Creatinine Clearance: 87.1 mL/min (by C-G formula based on SCr of 1.2 mg/dL). Liver Function Tests: No results for input(s): AST, ALT, ALKPHOS, BILITOT, PROT, ALBUMIN in the last 168 hours. No results for input(s): LIPASE, AMYLASE in the last 168 hours. No results for input(s): AMMONIA in the last 168 hours. Coagulation Profile: No results for input(s): INR, PROTIME in the last 168 hours. Cardiac Enzymes: No results for input(s): CKTOTAL, CKMB, CKMBINDEX, TROPONINI in the last 168 hours. BNP (last 3 results) No results for input(s): PROBNP in the last 8760 hours. HbA1C: No results for input(s): HGBA1C in the last 72 hours. CBG: Recent Labs   Lab 10/01/23 1626 10/01/23 2138 10/02/23 0813 10/02/23 0848 10/02/23 1137  GLUCAP 228* 128* 68* 116* 200*   Lipid Profile: No results for input(s): CHOL, HDL, LDLCALC, TRIG, CHOLHDL, LDLDIRECT in the last 72 hours. Thyroid Function Tests: No results for input(s): TSH, T4TOTAL, FREET4, T3FREE, THYROIDAB in the last 72 hours. Anemia Panel: No results for input(s): VITAMINB12, FOLATE, FERRITIN, TIBC, IRON, RETICCTPCT in the last 72 hours. Sepsis Labs: Recent Labs  Lab 09/28/23 1928 09/28/23 2343  LATICACIDVEN 0.9 1.0    Recent Results (from the past 240 hours)  Blood culture (routine x 2)     Status: None (Preliminary result)   Collection Time: 09/28/23  7:28 PM   Specimen: BLOOD  Result Value Ref Range Status   Specimen Description BLOOD BLOOD LEFT HAND  Final   Special Requests   Final    BOTTLES DRAWN AEROBIC AND ANAEROBIC Blood Culture results may not be optimal due to an inadequate volume of blood received in culture bottles   Culture   Final    NO GROWTH 4 DAYS Performed at [facility], [address] [place], [place] [address]    Report Status PENDING  Incomplete  Blood culture (routine x 2)     Status: None (Preliminary result)   Collection Time: 09/28/23  7:28 PM   Specimen: BLOOD  Result Value Ref Range Status   Specimen Description BLOOD BLOOD RIGHT HAND  Final   Special Requests   Final    BOTTLES DRAWN AEROBIC AND ANAEROBIC Blood Culture adequate volume   Culture   Final    NO GROWTH 4 DAYS Performed at [facility], [address] [place], [place] [address]    Report Status PENDING  Incomplete  Urine Culture     Status: Abnormal   Collection Time: 09/29/23  1:21 PM   Specimen: Urine, Random  Result Value Ref Range Status   Specimen Description   Final    URINE, RANDOM Performed at [facility], [address] [place], [place] [address]    Special Requests   Final    NONE Reflexed  from [id number] Performed at [facility], [address] [place], [place] [address]    Culture MULTIPLE SPECIES PRESENT, SUGGEST RECOLLECTION (A)  Final   Report Status 10/01/2023 FINAL  Final         Radiology Studies: No results found.       Scheduled Meds:  aspirin  EC  81 mg Oral Daily   atorvastatin   80 mg Oral Daily   dapagliflozin  propanediol  10 mg Oral Q breakfast   enoxaparin  (LOVENOX ) injection  0.5 mg/kg Subcutaneous Q24H   guaiFENesin   600 mg Oral BID   insulin  aspart  0-15 Units Subcutaneous TID WC   insulin  aspart  0-5 Units Subcutaneous QHS   insulin  aspart  10 Units Subcutaneous TID WC   insulin  glargine-yfgn  25 Units Subcutaneous BID   ipratropium-albuterol   3 mL Nebulization Q6H   metoprolol  succinate  25 mg Oral Daily   predniSONE   40 mg Oral Q breakfast   torsemide   40 mg Oral Daily   Continuous Infusions:   LOS: 4 days     Cody KATHEE Robson, MD [facility]   If 7PM-7AM, please contact night-coverage  10/02/2023, 12:29 PM

## 2023-10-03 DIAGNOSIS — J441 Chronic obstructive pulmonary disease with (acute) exacerbation: Secondary | ICD-10-CM | POA: Diagnosis not present

## 2023-10-03 LAB — CULTURE, BLOOD (ROUTINE X 2)
Culture: NO GROWTH
Culture: NO GROWTH
Special Requests: ADEQUATE

## 2023-10-03 LAB — GLUCOSE, CAPILLARY
Glucose-Capillary: 136 mg/dL — ABNORMAL HIGH (ref 70–99)
Glucose-Capillary: 214 mg/dL — ABNORMAL HIGH (ref 70–99)

## 2023-10-03 MED ORDER — PREDNISONE 20 MG PO TABS: 20.0000 mg | ORAL_TABLET | Freq: Every day | ORAL | 0 refills | Status: AC

## 2023-10-03 MED ORDER — LANTUS SOLOSTAR 100 UNIT/ML ~~LOC~~ SOPN: 20.0000 [IU] | PEN_INJECTOR | Freq: Every day | SUBCUTANEOUS | 0 refills | Status: DC

## 2023-10-03 MED ORDER — OXYCODONE HCL 5 MG PO TABS: 5.0000 mg | ORAL_TABLET | ORAL | 0 refills | Status: DC | PRN

## 2023-10-03 MED ORDER — PREDNISONE 20 MG PO TABS: 20.0000 mg | ORAL_TABLET | Freq: Every day | ORAL | 0 refills | Status: DC

## 2023-10-03 MED ORDER — HYDROCOD POLI-CHLORPHE POLI ER 10-8 MG/5ML PO SUER: 5.0000 mL | Freq: Four times a day (QID) | ORAL | 0 refills | Status: DC | PRN

## 2023-10-03 MED ORDER — METOPROLOL SUCCINATE ER 25 MG PO TB24: 25.0000 mg | ORAL_TABLET | Freq: Every day | ORAL | 0 refills | Status: DC

## 2023-10-03 NOTE — Discharge Summary (Signed)
 Physician Discharge Summary  Cody Alexander FMW:[id number] DOB: [birth year] DOA: 09/28/2023  PCP: [facility]  Admit date: 09/28/2023 Discharge date: 10/03/2023  Admitted From: Home Disposition:  Home  Recommendations for Outpatient Follow-up:  Follow up with PCP in 1-2 weeks   Home Health:HH PT, OT, RN  Equipment/Devices:None   Discharge Condition:Stable  CODE STATUS:FULL  Diet recommendation: Carb mod  Brief/Interim Summary:  Cody Alexander is a 68 y.o. male with medical history significant for Diastolic CHF, COPD on 3-4 L O2, hypertension, CAD with stent, DM on insulin , OSA, morbid obesity, with several past hospitalizations for worsening respiratory failure secondary to COPD/CHF exacerbation, most recently 1/15 to 09/13/2023 at [facility], with history of intubation in 12/2022 for up to a week.  She presented to the hospital bloodstained sputum, shortness of breath and general weakness.  He also reported bloody urine and he had recently been evaluated for this while he was hospitalized at [facility] in January 2025.  Outpatient follow-up with urologist was recommended at that time.   Discharge Diagnoses:  Principal Problem:   COPD with acute exacerbation (HCC) Active Problems:   Acute on chronic respiratory failure with hypoxia and hypercapnia (HCC)   Hematuria   CAD with history of stent angioplasty   CHF (congestive heart failure), NYHA class I, acute, diastolic (HCC)   OSA (obstructive sleep apnea)   Diabetes mellitus without complication (HCC)   Chronic back pain Acute on chronic respiratory failure with hypoxia and hypercapnia (HCC) COPD exacerbation OSA Patient with longstanding history of COPD and frequent admissions for exacerbations. Plan: Continue prednisone  40 mg daily, prescribed on discharge On room air.  Can resume home bronchodilator regimen.  Recommend outpatient follow-up with pulmonary   Intermittent hematuria He was evaluated by Dr. Penne,  urologist, on 09/29/2023.  Outpatient follow-up with urologist was recommended.  Hematuria has improved.  Referred to outpatient urology Dr. Rosina Penne at time of discharge   CAD with history of stent angioplasty Elevated troponin Mildly elevated troponin which is likely from demand ischemia No chest pain. Continue aspirin , atorvastatin  and metoprolol , NTG SL as needed chest pain     Chronic back pain, general weakness Analgesics as needed for pain. PT recommended home health therapy, ordered on DC    Discharge Instructions  Discharge Instructions     Ambulatory referral to Urology   Complete by: As directed    Diet - low sodium heart healthy   Complete by: As directed    Increase activity slowly   Complete by: As directed       Allergies as of 10/03/2023       Reactions   Erythromycin Anaphylaxis, Swelling   Had eye swelling with erythromycin during a time when he had perf ear drum. Tolerated azithromycin .        Medication List     STOP taking these medications    torsemide  20 MG tablet Commonly known as: DEMADEX        TAKE these medications    albuterol  108 (90 Base) MCG/ACT inhaler Commonly known as: Proventil  HFA Inhale 2 puffs into the lungs every 4 (four) hours as needed.   aspirin  EC 81 MG EC tablet Generic drug: aspirin  EC Take 81 mg by mouth daily.   atorvastatin  80 MG tablet Commonly known as: LIPITOR  Take 1 tablet by mouth daily.   bumetanide  1 MG tablet Commonly known as: BUMEX  Take 3 mg by mouth daily.   chlorpheniramine-HYDROcodone  10-8 MG/5ML Commonly known as: TUSSIONEX Take 5 mLs by mouth  every 6 (six) hours as needed for cough.   dapagliflozin  propanediol 10 MG Tabs tablet Commonly known as: FARXIGA  Take 1 tablet (10 mg total) by mouth daily.   fluconazole  200 MG tablet Commonly known as: DIFLUCAN  Take 1 tablet (200 mg total) by mouth once a week. On Saturdays.   hydrocortisone  cream 1 % Apply topically 4 (four) times  daily.   hydrOXYzine  25 MG tablet Commonly known as: ATARAX  Take by mouth every 6 (six) hours as needed for itching.   insulin  lispro 100 UNIT/ML KwikPen Commonly known as: HUMALOG  Inject 10 Units into the skin 3 (three) times daily with meals.   ipratropium-albuterol  0.5-2.5 (3) MG/3ML Soln Commonly known as: DUONEB Inhale 3 mLs by nebulization once every 6 (six) hours as needed.   Lantus  SoloStar 100 UNIT/ML Solostar Pen Generic drug: insulin  glargine Inject 20 Units into the skin daily. What changed: when to take this   losartan  50 MG tablet Commonly known as: COZAAR  Take 1 tablet by mouth daily.   metoprolol  succinate 25 MG 24 hr tablet Commonly known as: TOPROL -XL Take 1 tablet (25 mg total) by mouth daily.   Omeprazole 20 MG Tbec Take 1 tablet by mouth every morning.   oxyCODONE  5 MG immediate release tablet Commonly known as: Oxy IR/ROXICODONE  Take 1 tablet (5 mg total) by mouth every 4 (four) hours as needed for moderate pain (pain score 4-6).   polyethylene glycol 17 g packet Commonly known as: MIRALAX  / GLYCOLAX  Take 17 g by mouth daily as needed for moderate constipation.   predniSONE  20 MG tablet Commonly known as: DELTASONE  Take 1 tablet (20 mg total) by mouth daily for 5 days. What changed:  how much to take how to take this when to take this additional instructions   spironolactone  25 MG tablet Commonly known as: ALDACTONE  Take 1 tablet by mouth daily.   Symbicort  160-4.5 MCG/ACT inhaler Generic drug: budesonide -formoterol  Inhale 2 puffs into the lungs 2 (two) times daily.        Allergies  Allergen Reactions   Erythromycin Anaphylaxis and Swelling    Had eye swelling with erythromycin during a time when he had perf ear drum.  Tolerated azithromycin .    Consultations: None   Procedures/Studies: US  Venous Img Lower Bilateral (DVT) Result Date: 09/29/2023 CLINICAL DATA:  [id number] Leg pain, bilateral [id number] EXAM: BILATERAL LOWER  EXTREMITY VENOUS DOPPLER ULTRASOUND TECHNIQUE: Gray-scale sonography with graded compression, as well as color Doppler and duplex ultrasound were performed to evaluate the lower extremity deep venous systems from the level of the common femoral vein and including the common femoral, femoral, profunda femoral, popliteal and calf veins including the posterior tibial, peroneal and gastrocnemius veins when visible. The superficial great saphenous vein was also interrogated. Spectral Doppler was utilized to evaluate flow at rest and with distal augmentation maneuvers in the common femoral, femoral and popliteal veins. COMPARISON:  RIGHT lower extremity XRs, 07/28/2023. CT AP, 01/05/2023. FINDINGS: RIGHT LOWER EXTREMITY VENOUS Normal compressibility of the RIGHT common femoral, superficial femoral, and popliteal veins, as well as the visualized calf veins. Visualized portions of profunda femoral vein and great saphenous vein unremarkable. No filling defects to suggest DVT on grayscale or color Doppler imaging. Doppler waveforms show normal direction of venous flow, normal respiratory plasticity and response to augmentation. OTHER No evidence of superficial thrombophlebitis or abnormal fluid collection. Limitations: none LEFT LOWER EXTREMITY VENOUS Normal compressibility of the LEFT common femoral, superficial femoral, and popliteal veins, as well as the visualized calf veins.  Visualized portions of profunda femoral vein and great saphenous vein unremarkable. No filling defects to suggest DVT on grayscale or color Doppler imaging. Doppler waveforms show normal direction of venous flow, normal respiratory plasticity and response to augmentation. OTHER No evidence of superficial thrombophlebitis or abnormal fluid collection. Limitations: none IMPRESSION: No evidence of femoropopliteal DVT or superficial thrombophlebitis within either lower extremity. Thom Hall, MD [facility]  [facility] Radiology Electronically Signed   By: Thom Hall M.D.   On: 09/29/2023 14:28   DG Chest Port 1 View Result Date: 09/28/2023 CLINICAL DATA:  Shortness of breath EXAM: PORTABLE CHEST 1 VIEW COMPARISON:  07/23/2023 FINDINGS: Cardiomegaly. No acute airspace disease, pleural effusion or pneumothorax. IMPRESSION: No active disease. Cardiomegaly. Electronically Signed   By: Luke Bun M.D.   On: 09/28/2023 19:22      Subjective: Seen and examined on the day of discharge.  Stable no distress.  Discharge home.  Discharge Exam: Vitals:   10/02/23 2334 10/03/23 0908  BP: 119/76 105/64  Pulse: 88 90  Resp: 15 17  Temp: (!) 97.4 F (36.3 C) 98.6 F (37 C)  SpO2: 99% 90%   Vitals:   10/02/23 0832 10/02/23 1652 10/02/23 2334 10/03/23 0908  BP:  124/80 119/76 105/64  Pulse:  73 88 90  Resp:  18 15 17   Temp:  (!) 97.5 F (36.4 C) (!) 97.4 F (36.3 C) 98.6 F (37 C)  TempSrc:  Oral Oral Oral  SpO2: 97% 93% 99% 90%  Weight:      Height:        General: Pt is alert, awake, not in acute distress Cardiovascular: RRR, S1/S2 +, no rubs, no gallops Respiratory: CTA bilaterally, no wheezing, no rhonchi Abdominal: Soft, NT, ND, bowel sounds + Extremities: no edema, no cyanosis    The results of significant diagnostics from this hospitalization (including imaging, microbiology, ancillary and laboratory) are listed below for reference.     Microbiology: Recent Results (from the past 240 hours)  Blood culture (routine x 2)     Status: None   Collection Time: 09/28/23  7:28 PM   Specimen: BLOOD  Result Value Ref Range Status   Specimen Description BLOOD BLOOD LEFT HAND  Final   Special Requests   Final    BOTTLES DRAWN AEROBIC AND ANAEROBIC Blood Culture results may not be optimal due to an inadequate volume of blood received in culture bottles   Culture   Final    NO GROWTH 5 DAYS Performed at [facility], [address] [place], [place] [address]    Report  Status 10/03/2023 FINAL  Final  Blood culture (routine x 2)     Status: None   Collection Time: 09/28/23  7:28 PM   Specimen: BLOOD  Result Value Ref Range Status   Specimen Description BLOOD BLOOD RIGHT HAND  Final   Special Requests   Final    BOTTLES DRAWN AEROBIC AND ANAEROBIC Blood Culture adequate volume   Culture   Final    NO GROWTH 5 DAYS Performed at [facility], [address] [place], [place] [address]    Report Status 10/03/2023 FINAL  Final  Urine Culture     Status: Abnormal   Collection Time: 09/29/23  1:21 PM   Specimen: Urine, Random  Result Value Ref Range Status   Specimen Description   Final    URINE, RANDOM Performed at [facility], [address] [place], [place] [address]  Special Requests   Final    NONE Reflexed from [id number] Performed at [facility], [address] [place], [place] [address]    Culture MULTIPLE SPECIES PRESENT, SUGGEST RECOLLECTION (A)  Final   Report Status 10/01/2023 FINAL  Final     Labs: BNP (last 3 results) Recent Labs    06/12/23 1122 07/23/23 0025 09/28/23 1928  BNP 126.2* 208.8* 119.0*   Basic Metabolic Panel: Recent Labs  Lab 09/28/23 1807 09/29/23 0352 09/30/23 0110  NA 135 133* 135  K 4.4 4.9 4.4  CL 95* 93* 94*  CO2 29 31 30   GLUCOSE 156* 493* 261*  BUN 14 21 34*  CREATININE 0.91 1.16 1.20  CALCIUM  9.1 8.3* 8.7*   Liver Function Tests: No results for input(s): AST, ALT, ALKPHOS, BILITOT, PROT, ALBUMIN in the last 168 hours. No results for input(s): LIPASE, AMYLASE in the last 168 hours. No results for input(s): AMMONIA in the last 168 hours. CBC: Recent Labs  Lab 09/28/23 1807 09/29/23 0352 09/30/23 0110 10/01/23 0434  WBC 17.1* 15.0* 18.2* 14.5*  NEUTROABS  --   --  16.9* 11.4*  HGB 15.1 13.2 13.0 13.2  HCT 47.5 42.8 39.6 41.1  MCV 91.0 94.1 89.2 89.9  PLT 270 234 270 264   Cardiac Enzymes: No results for input(s): CKTOTAL, CKMB,  CKMBINDEX, TROPONINI in the last 168 hours. BNP: Invalid input(s): POCBNP CBG: Recent Labs  Lab 10/02/23 1137 10/02/23 1632 10/02/23 2054 10/03/23 0749 10/03/23 1115  GLUCAP 200* 328* 212* 136* 214*   D-Dimer No results for input(s): DDIMER in the last 72 hours. Hgb A1c No results for input(s): HGBA1C in the last 72 hours. Lipid Profile No results for input(s): CHOL, HDL, LDLCALC, TRIG, CHOLHDL, LDLDIRECT in the last 72 hours. Thyroid function studies No results for input(s): TSH, T4TOTAL, T3FREE, THYROIDAB in the last 72 hours.  Invalid input(s): FREET3 Anemia work up No results for input(s): VITAMINB12, FOLATE, FERRITIN, TIBC, IRON, RETICCTPCT in the last 72 hours. Urinalysis    Component Value Date/Time   COLORURINE COLORLESS (A) 09/29/2023 1321   APPEARANCEUR CLEAR (A) 09/29/2023 1321   APPEARANCEUR Clear 08/20/2013 0849   LABSPEC 1.006 09/29/2023 1321   LABSPEC 1.014 08/20/2013 0849   PHURINE 6.0 09/29/2023 1321   GLUCOSEU >=500 (A) 09/29/2023 1321   GLUCOSEU Negative 08/20/2013 0849   HGBUR SMALL (A) 09/29/2023 1321   BILIRUBINUR NEGATIVE 09/29/2023 1321   BILIRUBINUR Negative 08/20/2013 0849   KETONESUR NEGATIVE 09/29/2023 1321   PROTEINUR NEGATIVE 09/29/2023 1321   NITRITE NEGATIVE 09/29/2023 1321   LEUKOCYTESUR MODERATE (A) 09/29/2023 1321   LEUKOCYTESUR Negative 08/20/2013 0849   Sepsis Labs Recent Labs  Lab 09/28/23 1807 09/29/23 0352 09/30/23 0110 10/01/23 0434  WBC 17.1* 15.0* 18.2* 14.5*   Microbiology Recent Results (from the past 240 hours)  Blood culture (routine x 2)     Status: None   Collection Time: 09/28/23  7:28 PM   Specimen: BLOOD  Result Value Ref Range Status   Specimen Description BLOOD BLOOD LEFT HAND  Final   Special Requests   Final    BOTTLES DRAWN AEROBIC AND ANAEROBIC Blood Culture results may not be optimal due to an inadequate volume of blood received in culture bottles    Culture   Final    NO GROWTH 5 DAYS Performed at [facility], [address] [place], [place] [address]    Report Status 10/03/2023 FINAL  Final  Blood culture (routine x 2)  Status: None   Collection Time: 09/28/23  7:28 PM   Specimen: BLOOD  Result Value Ref Range Status   Specimen Description BLOOD BLOOD RIGHT HAND  Final   Special Requests   Final    BOTTLES DRAWN AEROBIC AND ANAEROBIC Blood Culture adequate volume   Culture   Final    NO GROWTH 5 DAYS Performed at [facility], [address] [place], [place] [address]    Report Status 10/03/2023 FINAL  Final  Urine Culture     Status: Abnormal   Collection Time: 09/29/23  1:21 PM   Specimen: Urine, Random  Result Value Ref Range Status   Specimen Description   Final    URINE, RANDOM Performed at [facility], [address], [place], [place] [address]    Special Requests   Final    NONE Reflexed from [id number] Performed at [facility], [address] [place], [place] [address]    Culture MULTIPLE SPECIES PRESENT, SUGGEST RECOLLECTION (A)  Final   Report Status 10/01/2023 FINAL  Final     Time coordinating discharge: Over 30 minutes  SIGNED:   Calvin KATHEE Robson, MD  [facility] 10/03/2023, 2:48 PM Pager   If 7PM-7AM, please contact night-coverage

## 2023-10-03 NOTE — Plan of Care (Signed)
  Problem: Coping: Goal: Ability to adjust to condition or change in health will improve Outcome: Progressing   Problem: Health Behavior/Discharge Planning: Goal: Ability to manage health-related needs will improve Outcome: Progressing   Problem: Metabolic: Goal: Ability to maintain appropriate glucose levels will improve Outcome: Progressing   Problem: Clinical Measurements: Goal: Diagnostic test results will improve Outcome: Progressing   Problem: Activity: Goal: Risk for activity intolerance will decrease Outcome: Progressing   Problem: Activity: Goal: Ability to tolerate increased activity will improve Outcome: Progressing   Problem: Respiratory: Goal: Levels of oxygenation will improve Outcome: Progressing

## 2023-10-03 NOTE — TOC Progression Note (Signed)
 Transition of Care [facility]) - Progression Note    Patient Details  Name: Cody Alexander MRN: [id number] Date of Birth: 31-Jul-1956  Transition of Care [facility] CM/SW Contact  Treshon Stannard A Amahd Morino, RN Phone Number: 10/03/2023, 6:46 AM  Clinical Narrative:   Chart reviewed.  Meet with patient at bedside on yesterday.  Patient reports that prior to admission he lived with a 8 year old lady and her daughter.  He reports that he was able to walk and get around at home.  Patient reports that he has a hospital bed, BSC and shower chair at home.  He reports that he has fallen at least 3 times prior to admission .  He informs me that he is active with [facility] for Home Health services.  Patient reports that he is active with home 02 with [facility].    I have confirmed with [place]  with [facility] that patient is active with PT, OT, and RN.    TOC will continue to follow for discharge planning.    Expected Discharge Plan: Home w Home Health Services Barriers to Discharge: Continued Medical Work up  Expected Discharge Plan and Services     Post Acute Care Choice: Resumption of Svcs/PTA Provider Living arrangements for the past 2 months: Single Family Home                           HH Arranged: RN, OT, PT [facility] Agency: [facility] Date [facility] Agency Contacted: 09/29/23   Representative spoke with at [facility] Agency: [place]   Social Determinants of Health (SDOH) Interventions SDOH Screenings   Food Insecurity: No Food Insecurity (09/29/2023)  Housing: Low Risk  (09/29/2023)  Recent Concern: Housing - Medium Risk (07/23/2023)  Transportation Needs: No Transportation Needs (09/29/2023)  Recent Concern: Transportation Needs - Unmet Transportation Needs (07/23/2023)  Utilities: Not At Risk (09/29/2023)  Alcohol Screen: Low Risk  (07/23/2023)  Financial Resource Strain: Low Risk  (07/23/2023)  Physical Activity: Inactive (09/11/2023)   Received from [facility]  Social Connections: Socially  Integrated (09/30/2023)  Recent Concern: Social Connections - Moderately Isolated (09/11/2023)   Received from [facility]  Stress: Stress Concern Present (09/11/2023)   Received from [facility]  Tobacco Use: Medium Risk (09/28/2023)  Health Literacy: Medium Risk (09/11/2023)   Received from [facility] Care    Readmission Risk Interventions    09/29/2023    1:04 PM 07/23/2023    1:58 PM 06/25/2022   10:13 AM  Readmission Risk Prevention Plan  Transportation Screening Complete Complete Complete  PCP or Specialist Appt within 3-5 Days Complete Complete Complete  HRI or Home Care Consult Complete Complete Complete  Social Work Consult for Recovery Care Planning/Counseling Complete Complete Complete  Palliative Care Screening Not Applicable Not Applicable Not Applicable  Medication Review Oceanographer) Complete Complete Complete

## 2023-10-03 NOTE — TOC Progression Note (Signed)
 Transition of Care [facility]) - Progression Note    Patient Details  Name: Cody Alexander MRN: [id number] Date of Birth: [birth year]  [facility] CM/SW Contact  Royanne JINNY Bernheim, RN Phone Number: 10/03/2023, 1:26 PM  Clinical Narrative:    Provided a taxi voucher   Expected Discharge Plan: Home w Home Health Services Barriers to Discharge: Continued Medical Work up  Expected Discharge Plan and Services     Post Acute Care Choice: Resumption of Svcs/PTA Provider Living arrangements for the past 2 months: Single Family Home Expected Discharge Date: 10/03/23                         HH Arranged: RN, OT, PT [facility] Agency: [facility] Date [facility] Agency Contacted: 09/29/23   Representative spoke with at [facility] Agency: [place]   Social Determinants of Health (SDOH) Interventions SDOH Screenings   Food Insecurity: No Food Insecurity (09/29/2023)  Housing: Low Risk  (09/29/2023)  Recent Concern: Housing - Medium Risk (07/23/2023)  Transportation Needs: No Transportation Needs (09/29/2023)  Recent Concern: Transportation Needs - Unmet Transportation Needs (07/23/2023)  Utilities: Not At Risk (09/29/2023)  Alcohol Screen: Low Risk  (07/23/2023)  Financial Resource Strain: Low Risk  (07/23/2023)  Physical Activity: Inactive (09/11/2023)   Received from [facility]  Social Connections: Socially Integrated (09/30/2023)  Recent Concern: Social Connections - Moderately Isolated (09/11/2023)   Received from [facility]  Stress: Stress Concern Present (09/11/2023)   Received from [facility]  Tobacco Use: Medium Risk (09/28/2023)  Health Literacy: Medium Risk (09/11/2023)   Received from [facility] Care    Readmission Risk Interventions    09/29/2023    1:04 PM 07/23/2023    1:58 PM 06/25/2022   10:13 AM  Readmission Risk Prevention Plan  Transportation Screening Complete Complete Complete  PCP or Specialist Appt within 3-5 Days Complete Complete Complete  HRI or  Home Care Consult Complete Complete Complete  Social Work Consult for Recovery Care Planning/Counseling Complete Complete Complete  Palliative Care Screening Not Applicable Not Applicable Not Applicable  Medication Review Oceanographer) Complete Complete Complete

## 2023-10-03 NOTE — Inpatient Diabetes Management (Signed)
 Inpatient Diabetes Program Recommendations  AACE/ADA: New Consensus Statement on Inpatient Glycemic Control (2015)  Target Ranges:  Prepandial:   less than 140 mg/dL      Peak postprandial:   less than 180 mg/dL (1-2 hours)      Critically ill patients:  140 - 180 mg/dL    Latest Reference Range & Units 10/02/23 08:13 10/02/23 08:48 10/02/23 11:37 10/02/23 16:32 10/02/23 20:54  Glucose-Capillary 70 - 99 mg/dL 68 (L) 883 (H) 799 (H)  13 units Novolog   25 units Semglee   328 (H)  21 units Novolog   212 (H)  2 units Novolog   25 units Semglee    (L): Data is abnormally low (H): Data is abnormally high  Latest Reference Range & Units 10/03/23 07:49  Glucose-Capillary 70 - 99 mg/dL 863 (H)  (H): Data is abnormally high  Home DM Meds: Semglee  20 units bid     Novolog  10 units tid meal coverage     Farxiga  10 mg daily        Current Orders: Semglee  25 units BID                           Novolog  Moderate Correction Scale/ SSI (0-15 units) TID AC + HS                           Novolog  10 units TID with meals                           Farxiga  10 mg daily         MD- Note pt getting Prednisone  40 mg daily.   Note Semglee  reduced yest AM--CBG better this AM (136)   Afternoon CBGs remain elevated likely due to the Prednisone .  Please consider: Increase the Novolog  Meal Coverage slightly to 12 units TID with meals     --Will follow patient during hospitalization--  Adina Rudolpho Arrow RN, MSN, CDCES Diabetes Coordinator Inpatient Glycemic Control Team Team Pager: [phone] (8a-5p)

## 2023-11-19 ENCOUNTER — Ambulatory Visit: Payer: 59 | Admitting: Urology

## 2023-11-19 ENCOUNTER — Encounter: Payer: Self-pay | Admitting: Urology

## 2023-12-18 ENCOUNTER — Other Ambulatory Visit: Payer: Self-pay

## 2023-12-18 ENCOUNTER — Emergency Department

## 2023-12-18 ENCOUNTER — Encounter: Payer: Self-pay | Admitting: *Deleted

## 2023-12-18 ENCOUNTER — Observation Stay
Admission: EM | Admit: 2023-12-18 | Discharge: 2023-12-23 | Disposition: A | Discharge status: Home Health | Attending: Internal Medicine | Admitting: Internal Medicine

## 2023-12-18 DIAGNOSIS — J449 Chronic obstructive pulmonary disease, unspecified: Secondary | ICD-10-CM | POA: Diagnosis not present

## 2023-12-18 DIAGNOSIS — I2510 Atherosclerotic heart disease of native coronary artery without angina pectoris: Secondary | ICD-10-CM | POA: Diagnosis not present

## 2023-12-18 DIAGNOSIS — Z794 Long term (current) use of insulin: Secondary | ICD-10-CM

## 2023-12-18 DIAGNOSIS — R7989 Other specified abnormal findings of blood chemistry: Secondary | ICD-10-CM | POA: Diagnosis not present

## 2023-12-18 DIAGNOSIS — R21 Rash and other nonspecific skin eruption: Secondary | ICD-10-CM | POA: Diagnosis present

## 2023-12-18 DIAGNOSIS — I110 Hypertensive heart disease with heart failure: Secondary | ICD-10-CM | POA: Diagnosis not present

## 2023-12-18 DIAGNOSIS — R072 Precordial pain: Secondary | ICD-10-CM | POA: Diagnosis present

## 2023-12-18 DIAGNOSIS — J9611 Chronic respiratory failure with hypoxia: Secondary | ICD-10-CM | POA: Diagnosis present

## 2023-12-18 DIAGNOSIS — E1165 Type 2 diabetes mellitus with hyperglycemia: Secondary | ICD-10-CM

## 2023-12-18 DIAGNOSIS — R2689 Other abnormalities of gait and mobility: Secondary | ICD-10-CM | POA: Diagnosis not present

## 2023-12-18 DIAGNOSIS — E66813 Obesity, class 3: Secondary | ICD-10-CM | POA: Diagnosis present

## 2023-12-18 DIAGNOSIS — I5032 Chronic diastolic (congestive) heart failure: Secondary | ICD-10-CM

## 2023-12-18 DIAGNOSIS — Z79899 Other long term (current) drug therapy: Secondary | ICD-10-CM | POA: Insufficient documentation

## 2023-12-18 DIAGNOSIS — Z955 Presence of coronary angioplasty implant and graft: Secondary | ICD-10-CM | POA: Diagnosis not present

## 2023-12-18 DIAGNOSIS — L03119 Cellulitis of unspecified part of limb: Secondary | ICD-10-CM

## 2023-12-18 DIAGNOSIS — Z6841 Body Mass Index (BMI) 40.0 and over, adult: Secondary | ICD-10-CM | POA: Insufficient documentation

## 2023-12-18 DIAGNOSIS — Z87891 Personal history of nicotine dependence: Secondary | ICD-10-CM | POA: Diagnosis not present

## 2023-12-18 DIAGNOSIS — R42 Dizziness and giddiness: Secondary | ICD-10-CM

## 2023-12-18 DIAGNOSIS — Z7982 Long term (current) use of aspirin: Secondary | ICD-10-CM | POA: Diagnosis not present

## 2023-12-18 DIAGNOSIS — R079 Chest pain, unspecified: Secondary | ICD-10-CM | POA: Diagnosis present

## 2023-12-18 DIAGNOSIS — R531 Weakness: Principal | ICD-10-CM

## 2023-12-18 DIAGNOSIS — G4733 Obstructive sleep apnea (adult) (pediatric): Secondary | ICD-10-CM | POA: Diagnosis not present

## 2023-12-18 DIAGNOSIS — R296 Repeated falls: Secondary | ICD-10-CM | POA: Diagnosis not present

## 2023-12-18 DIAGNOSIS — I10 Essential (primary) hypertension: Secondary | ICD-10-CM | POA: Diagnosis present

## 2023-12-18 LAB — BASIC METABOLIC PANEL WITH GFR
Anion gap: 9 (ref 5–15)
BUN: 23 mg/dL (ref 8–23)
CO2: 38 mmol/L — ABNORMAL HIGH (ref 22–32)
Calcium: 9 mg/dL (ref 8.9–10.3)
Chloride: 90 mmol/L — ABNORMAL LOW (ref 98–111)
Creatinine, Ser: 1.3 mg/dL — ABNORMAL HIGH (ref 0.61–1.24)
GFR, Estimated: >60 mL/min (ref >60)
Glucose, Bld: 293 mg/dL — ABNORMAL HIGH (ref 70–99)
Potassium: 3.9 mmol/L (ref 3.5–5.1)
Sodium: 137 mmol/L (ref 135–145)

## 2023-12-18 LAB — CBC
HCT: 42.6 % (ref 39.0–52.0)
Hemoglobin: 13.6 g/dL (ref 13.0–17.0)
MCH: 29.6 pg (ref 26.0–34.0)
MCHC: 31.9 g/dL (ref 30.0–36.0)
MCV: 92.6 fL (ref 80.0–100.0)
Platelets: 239 10*3/uL (ref 150–400)
RBC: 4.6 MIL/uL (ref 4.22–5.81)
RDW: 13.3 % (ref 11.5–15.5)
WBC: 8.3 10*3/uL (ref 4.0–10.5)
nRBC: 0 % (ref 0.0–0.2)

## 2023-12-18 LAB — TROPONIN I (HIGH SENSITIVITY)
Troponin I (High Sensitivity): 116 ng/L (ref <18)
Troponin I (High Sensitivity): 123 ng/L (ref <18)

## 2023-12-18 MED ORDER — OXYCODONE HCL 5 MG PO TABS: 5.0000 mg | ORAL_TABLET | Freq: Three times a day (TID) | ORAL | 0 refills | Status: DC | PRN

## 2023-12-18 MED ORDER — OXYCODONE-ACETAMINOPHEN 5-325 MG PO TABS
1.0000 | ORAL_TABLET | Freq: Once | ORAL | Status: AC
  Administered 2023-12-18: 1 via ORAL
  Filled 2023-12-18: qty 1

## 2023-12-18 MED ORDER — CEPHALEXIN 500 MG PO CAPS: 500.0000 mg | ORAL_CAPSULE | Freq: Three times a day (TID) | ORAL | 0 refills | Status: DC

## 2023-12-18 NOTE — ED Triage Notes (Addendum)
 Pt to triage via wheelchair.  Pt brought in via ems from home with chest pain.  Sx for 1 week.  Pt on 3 liters oxygen at home.   Iv in place.  No cough. Hx copd chf.    Pt has swelling to both lower legs.  Pt alert  speech clear.   Fsbs 320 per ems.  324 asa given by ems

## 2023-12-18 NOTE — ED Provider Notes (Signed)
 [facility] Provider Note    Event Date/Time   First MD Initiated Contact with Patient 12/18/23 2116     (approximate)  History   Chief Complaint: Chest Pain  HPI  Cody Alexander is a 68 y.o. male with a past medical history of CHF, COPD, diabetes, hypertension, presents to the emergency department with multiple complaints.  According to the patient for the last week or so he has been experiencing pain in the center of his chest that seems to radiate out.  He is also noticed increased swelling in both of his legs as well as redness and pain to the legs.  States over the last week or so he has been falling more frequently than normal, estimates 3 or 4 falls in the last 5 days.  He states during one of the falls he hit his head.  Patient also states he has noticed a rash to his back, however later states this has been present for months.  Physical Exam   Triage Vital Signs: ED Triage Vitals  Encounter Vitals Group     BP 12/18/23 2022 (!) 146/89     Systolic BP Percentile --      Diastolic BP Percentile --      Pulse Rate 12/18/23 2022 85     Resp 12/18/23 2022 20     Temp 12/18/23 2022 99.3 F (37.4 C)     Temp Source 12/18/23 2022 Oral     SpO2 12/18/23 2022 96 %     Weight --      Height --      Head Circumference --      Peak Flow --      Pain Score 12/18/23 2116 8     Pain Loc --      Pain Education --      Exclude from Growth Chart --     Most recent vital signs: Vitals:   12/18/23 2113 12/18/23 2138  BP:  124/81  Pulse:  74  Resp:  17  Temp:    SpO2: 96% 96%    General: Awake, no distress.  CV:  Good peripheral perfusion.  Regular rate and rhythm  Resp:  Normal effort.  Equal breath sounds bilaterally.  Abd:  No distention.  Soft, nontender.  No rebound or guarding. Other:  Patient does have some mild erythema to his bilateral lower extremities with 2+ edema, states tenderness to palpation.  Patient does have mild erythema into  his back as well appears more consistent with possible folliculitis or heat rash.   ED Results / Procedures / Treatments   EKG  EKG viewed and interpreted by myself shows a sinus rhythm 86 bpm with a narrow QRS, normal axis, normal intervals, nonspecific ST changes, occasional PVC.  RADIOLOGY  I have reviewed and interpreted chest x-ray images.  No obvious consolidation seen on my evaluation. Radiology is read the x-ray is negative for acute process.   MEDICATIONS ORDERED IN ED: Medications  oxyCODONE -acetaminophen  (PERCOCET/ROXICET) 5-325 MG per tablet 1 tablet (has no administration in time range)     IMPRESSION / MDM / ASSESSMENT AND PLAN / ED COURSE  I reviewed the triage vital signs and the nursing notes.  Patient's presentation is most consistent with acute presentation with potential threat to life or bodily function.  Patient presents to the emergency department with multiple complaints.  Patient states he has been feeling more weak recently states his legs have been giving out from under him but  he states this is due to leg pain and swelling.  Patient states he has leg swelling at baseline, takes Bumex .  Patient does have mild erythema of the lower legs, he states his main concern is make sure he does not have a blood clot.  Denies any history of blood clots previously.  Will obtain ultrasounds of the legs to rule out DVT.  Will cover with antibiotics for likely cellulitis.  Patient's troponin is elevated on his lab work however he has a history of similarly elevated troponins in the past.  No chest pain.  Will repeat a troponin as a precaution.  Patient CBC is normal with a normal white blood cell count and chemistry shows no concerning findings.  Patient states during his last fall he did hit his head, we will obtain a CT scan of the head to rule out intracranial abnormality.  Patient is requesting something for pain due to his leg pain.  Will provide Percocet for the patient in  the emergency department for pain control Rocephin  to cover for cellulitis.  As long as the remainder of the patient's workup does not appear to show any significant finding given his reassuring workup so far I believe the patient would be safe for discharge home with antibiotics and a short course of pain medication.    Patient care signed out to oncoming provider pending repeat troponin urinalysis CT scan of the head and ultrasound.  FINAL CLINICAL IMPRESSION(S) / ED DIAGNOSES   Weakness Cellulitis    Note:  This document was prepared using Dragon voice recognition software and may include unintentional dictation errors.   Ruth Cove, MD 12/18/23 2320

## 2023-12-18 NOTE — ED Provider Notes (Signed)
 Physical Exam  BP (!) 165/111   Pulse 81   Temp 99.3 F (37.4 C) (Oral)   Resp 18   SpO2 92%    Patient received in report from Dr. Azalee Bolds.  Currently awaiting repeat troponin #2.  If it is not uptrending anticipate discharge following lower venous ultrasounds and CT scan of the head if no acute findings.  ----------------------------------------- 1:21 AM on 12/19/2023 ----------------------------------------- Patient reports that he is still having intermittent sharp chest pains, does not sound particularly typical of ACS.  Given the pleuritic nature of the reported pain at this time I discussed with the patient we will order CT to exclude pulmonary embolism.  He does have uptrending troponin which is cause for admission, but at this time I do wish to exclude PE prior to admission decision.  He has received aspirin  today.  Will give morphine  for pain at this time.  He is resting overall without distress, but given the ongoing sharp nature of chest pain I think further evaluation is warranted especially given the uptrend in troponin.  The patient is understanding agreeable with CT scan and a plan for admission based on results of that thereafter   CT Angio Chest PE W and/or Wo Contrast Result Date: 12/19/2023 CLINICAL DATA:  Pulmonary embolism suspected EXAM: CT ANGIOGRAPHY CHEST WITH CONTRAST TECHNIQUE: Multidetector CT imaging of the chest was performed using the standard protocol during bolus administration of intravenous contrast. Multiplanar CT image reconstructions and MIPs were obtained to evaluate the vascular anatomy. RADIATION DOSE REDUCTION: This exam was performed according to the departmental dose-optimization program which includes automated exposure control, adjustment of the mA and/or kV according to patient size and/or use of iterative reconstruction technique. CONTRAST:  OMNIPAQUE  IOHEXOL  350 MG/ML SOLN COMPARISON:  None Available. FINDINGS: Cardiovascular:  Satisfactory opacification of the pulmonary arteries to the segmental level. No evidence of pulmonary embolism. Normal heart size. No pericardial effusion. Mediastinum/Nodes: No enlarged mediastinal, hilar, or axillary lymph nodes. Thyroid gland, trachea, and esophagus demonstrate no significant findings. Lungs/Pleura: Lungs are clear. No pleural effusion or pneumothorax. Upper Abdomen: No acute abnormality. Musculoskeletal: No chest wall abnormality. No acute or significant osseous findings. Other: Unchanged inclusion cyst in the midline upper back. Review of the MIP images confirms the above findings. IMPRESSION: No pulmonary embolus or other acute thoracic abnormality. Electronically Signed   By: Juanetta Nordmann M.D.   On: 12/19/2023 02:38   US  Venous Img Lower Bilateral Result Date: 12/19/2023 CLINICAL DATA:  erythema, pain EXAM: BILATERAL LOWER EXTREMITY VENOUS DOPPLER ULTRASOUND TECHNIQUE: Gray-scale sonography with graded compression, as well as color Doppler and duplex ultrasound were performed to evaluate the lower extremity deep venous systems from the level of the common femoral vein and including the common femoral, femoral, profunda femoral, popliteal and calf veins including the posterior tibial, peroneal and gastrocnemius veins when visible. The superficial great saphenous vein was also interrogated. Spectral Doppler was utilized to evaluate flow at rest and with distal augmentation maneuvers in the common femoral, femoral and popliteal veins. COMPARISON:  None Available. FINDINGS: RIGHT LOWER EXTREMITY Common Femoral Vein: No evidence of thrombus. Normal compressibility, respiratory phasicity and response to augmentation. Saphenofemoral Junction: No evidence of thrombus. Normal compressibility and flow on color Doppler imaging. Profunda Femoral Vein: No evidence of thrombus. Normal compressibility and flow on color Doppler imaging. Femoral Vein: No evidence of thrombus. Normal compressibility,  respiratory phasicity and response to augmentation. Popliteal Vein: No evidence of thrombus. Normal compressibility, respiratory phasicity and response to augmentation.  Calf Veins: No evidence of thrombus. Normal compressibility and flow on color Doppler imaging. Superficial Great Saphenous Vein: No evidence of thrombus. Normal compressibility. Venous Reflux:  None. Other Findings:  None. LEFT LOWER EXTREMITY Common Femoral Vein: No evidence of thrombus. Normal compressibility, respiratory phasicity and response to augmentation. Saphenofemoral Junction: No evidence of thrombus. Normal compressibility and flow on color Doppler imaging. Profunda Femoral Vein: No evidence of thrombus. Normal compressibility and flow on color Doppler imaging. Femoral Vein: No evidence of thrombus. Normal compressibility, respiratory phasicity and response to augmentation. Popliteal Vein: No evidence of thrombus. Normal compressibility, respiratory phasicity and response to augmentation. Calf Veins: No evidence of thrombus. Normal compressibility and flow on color Doppler imaging. Superficial Great Saphenous Vein: No evidence of thrombus. Normal compressibility. Venous Reflux:  None. Other Findings:  None. IMPRESSION: No evidence of deep venous thrombosis in either lower extremity. Electronically Signed   By: Morgane  Naveau M.D.   On: 12/19/2023 00:31   CT HEAD WO CONTRAST ( ) Result Date: 12/18/2023 CLINICAL DATA:  Dizziness and head trauma EXAM: CT HEAD WITHOUT CONTRAST TECHNIQUE: Contiguous axial images were obtained from the base of the skull through the vertex without intravenous contrast. RADIATION DOSE REDUCTION: This exam was performed according to the departmental dose-optimization program which includes automated exposure control, adjustment of the mA and/or kV according to patient size and/or use of iterative reconstruction technique. COMPARISON:  01/08/2023 FINDINGS: Brain: No mass,hemorrhage or extra-axial collection.  Normal appearance of the parenchyma and CSF spaces. Vascular: No hyperdense vessel or unexpected vascular calcification. Skull: The visualized skull base, calvarium and extracranial soft tissues are normal. Sinuses/Orbits: Right sphenoid sinus opacification.  Normal orbits. Other: None. IMPRESSION: 1. No acute intracranial abnormality. 2. Right sphenoid sinus opacification. Electronically Signed   By: Juanetta Nordmann M.D.   On: 12/18/2023 23:30   DG Chest 2 View Result Date: 12/18/2023 CLINICAL DATA:  chest pain Pt brought in via ems from home with chest pain. Sx for 1 week. Pt on 3 liters oxygen at home. No cough. Hx copd chf. Pt has swelling to both lower legs. EXAM: CHEST - 2 VIEW COMPARISON:  Chest x-ray 09/28/2023, CT chest 01/05/2023 FINDINGS: The heart and mediastinal contours are unchanged. No focal consolidation. No pulmonary edema. No pleural effusion. No pneumothorax. No acute osseous abnormality. IMPRESSION: No active cardiopulmonary disease. Electronically Signed   By: Morgane  Naveau M.D.   On: 12/18/2023 21:43       Iver Marker, MD 12/19/23 [phone]

## 2023-12-18 NOTE — Discharge Instructions (Signed)
 Please take your antibiotics as prescribed for their entire course.  Please take your pain medication as needed for leg discomfort but only as prescribed.  Do not drink alcohol or drive while taking pain medication.  Please follow-up with your primary care doctor in the next 2 days for recheck/reevaluation.  Return to the emergency department for any symptom personally concerning to yourself.

## 2023-12-19 ENCOUNTER — Emergency Department

## 2023-12-19 DIAGNOSIS — R079 Chest pain, unspecified: Secondary | ICD-10-CM | POA: Diagnosis not present

## 2023-12-19 DIAGNOSIS — R42 Dizziness and giddiness: Secondary | ICD-10-CM

## 2023-12-19 DIAGNOSIS — I214 Non-ST elevation (NSTEMI) myocardial infarction: Secondary | ICD-10-CM | POA: Diagnosis not present

## 2023-12-19 DIAGNOSIS — R296 Repeated falls: Secondary | ICD-10-CM

## 2023-12-19 DIAGNOSIS — I5032 Chronic diastolic (congestive) heart failure: Secondary | ICD-10-CM

## 2023-12-19 DIAGNOSIS — R072 Precordial pain: Secondary | ICD-10-CM | POA: Diagnosis not present

## 2023-12-19 LAB — URINALYSIS, COMPLETE (UACMP) WITH MICROSCOPIC
Bacteria, UA: NONE SEEN
Bilirubin Urine: NEGATIVE
Glucose, UA: 150 mg/dL — AB
Hgb urine dipstick: NEGATIVE
Ketones, ur: NEGATIVE mg/dL
Nitrite: NEGATIVE
Protein, ur: NEGATIVE mg/dL
Specific Gravity, Urine: 1.017 (ref 1.005–1.030)
pH: 5 (ref 5.0–8.0)

## 2023-12-19 LAB — CBG MONITORING, ED
Glucose-Capillary: 292 mg/dL — ABNORMAL HIGH (ref 70–99)
Glucose-Capillary: 256 mg/dL — ABNORMAL HIGH (ref 70–99)
Glucose-Capillary: 210 mg/dL — ABNORMAL HIGH (ref 70–99)

## 2023-12-19 LAB — HIV ANTIBODY (ROUTINE TESTING W REFLEX): HIV Screen 4th Generation wRfx: NONREACTIVE

## 2023-12-19 LAB — CK: Total CK: 63 U/L (ref 49–397)

## 2023-12-19 MED ORDER — MORPHINE SULFATE (PF) 4 MG/ML IV SOLN
4.0000 mg | Freq: Once | INTRAVENOUS | Status: AC
  Administered 2023-12-19: 4 mg via INTRAVENOUS
  Filled 2023-12-19: qty 1

## 2023-12-19 MED ORDER — ACETAMINOPHEN 325 MG PO TABS
650.0000 mg | ORAL_TABLET | ORAL | Status: DC | PRN
  Administered 2023-12-19 – 2023-12-21 (×4): 650 mg via ORAL
  Filled 2023-12-19 (×3): qty 2

## 2023-12-19 MED ORDER — ONDANSETRON HCL 4 MG/2ML IJ SOLN: 4.0000 mg | Freq: Four times a day (QID) | INTRAMUSCULAR | Status: DC | PRN

## 2023-12-19 MED ORDER — ASPIRIN 81 MG PO TBEC: 81.0000 mg | DELAYED_RELEASE_TABLET | Freq: Every day | ORAL | Status: DC

## 2023-12-19 MED ORDER — INSULIN ASPART 100 UNIT/ML IJ SOLN
0.0000 [IU] | Freq: Every day | INTRAMUSCULAR | Status: DC
  Administered 2023-12-19 – 2023-12-22 (×3): 2 [IU] via SUBCUTANEOUS
  Filled 2023-12-19 (×4): qty 1

## 2023-12-19 MED ORDER — INSULIN ASPART 100 UNIT/ML IJ SOLN
0.0000 [IU] | Freq: Three times a day (TID) | INTRAMUSCULAR | Status: DC
  Administered 2023-12-19 (×2): 5 [IU] via SUBCUTANEOUS
  Administered 2023-12-20 (×2): 3 [IU] via SUBCUTANEOUS
  Administered 2023-12-21 (×2): 2 [IU] via SUBCUTANEOUS
  Administered 2023-12-21: 9 [IU] via SUBCUTANEOUS
  Administered 2023-12-22: 3 [IU] via SUBCUTANEOUS
  Administered 2023-12-22: 2 [IU] via SUBCUTANEOUS
  Administered 2023-12-22: 3 [IU] via SUBCUTANEOUS
  Filled 2023-12-19 (×9): qty 1

## 2023-12-19 MED ORDER — MOMETASONE FURO-FORMOTEROL FUM 200-5 MCG/ACT IN AERO
2.0000 | INHALATION_SPRAY | Freq: Two times a day (BID) | RESPIRATORY_TRACT | Status: DC
  Administered 2023-12-19 – 2023-12-22 (×7): 2 via RESPIRATORY_TRACT
  Filled 2023-12-19: qty 8.8

## 2023-12-19 MED ORDER — IOHEXOL 350 MG/ML SOLN
100.0000 mL | Freq: Once | INTRAVENOUS | Status: AC | PRN
  Administered 2023-12-19: 100 mL via INTRAVENOUS

## 2023-12-19 MED ORDER — ASPIRIN 81 MG PO TBEC
81.0000 mg | DELAYED_RELEASE_TABLET | Freq: Every day | ORAL | Status: DC
  Administered 2023-12-19 – 2023-12-23 (×5): 81 mg via ORAL
  Filled 2023-12-19 (×5): qty 1

## 2023-12-19 MED ORDER — ATORVASTATIN CALCIUM 80 MG PO TABS
80.0000 mg | ORAL_TABLET | Freq: Every day | ORAL | Status: DC
  Administered 2023-12-19 – 2023-12-23 (×5): 80 mg via ORAL
  Filled 2023-12-19 (×4): qty 1
  Filled 2023-12-19: qty 4

## 2023-12-19 MED ORDER — INSULIN GLARGINE-YFGN 100 UNIT/ML ~~LOC~~ SOLN
20.0000 [IU] | Freq: Every day | SUBCUTANEOUS | Status: DC
  Administered 2023-12-19 – 2023-12-21 (×3): 20 [IU] via SUBCUTANEOUS
  Filled 2023-12-19 (×3): qty 0.2

## 2023-12-19 MED ORDER — ALBUTEROL SULFATE (2.5 MG/3ML) 0.083% IN NEBU: 2.5000 mg | INHALATION_SOLUTION | RESPIRATORY_TRACT | Status: DC | PRN

## 2023-12-19 MED ORDER — HYDROXYZINE HCL 25 MG PO TABS
25.0000 mg | ORAL_TABLET | Freq: Three times a day (TID) | ORAL | Status: DC | PRN
  Administered 2023-12-19 – 2023-12-22 (×4): 25 mg via ORAL
  Filled 2023-12-19 (×4): qty 1

## 2023-12-19 MED ORDER — METOPROLOL SUCCINATE ER 25 MG PO TB24
25.0000 mg | ORAL_TABLET | Freq: Every day | ORAL | Status: DC
  Administered 2023-12-19 – 2023-12-23 (×5): 25 mg via ORAL
  Filled 2023-12-19 (×5): qty 1

## 2023-12-19 MED ORDER — BUMETANIDE 1 MG PO TABS
3.0000 mg | ORAL_TABLET | Freq: Every day | ORAL | Status: DC
  Administered 2023-12-19 – 2023-12-23 (×5): 3 mg via ORAL
  Filled 2023-12-19 (×5): qty 3

## 2023-12-19 MED ORDER — NITROGLYCERIN 0.4 MG SL SUBL
0.4000 mg | SUBLINGUAL_TABLET | SUBLINGUAL | Status: DC | PRN
  Administered 2023-12-19 (×3): 0.4 mg via SUBLINGUAL
  Filled 2023-12-19 (×2): qty 1

## 2023-12-19 MED ORDER — CYCLOBENZAPRINE HCL 10 MG PO TABS
10.0000 mg | ORAL_TABLET | Freq: Three times a day (TID) | ORAL | Status: DC | PRN
  Administered 2023-12-19: 10 mg via ORAL
  Filled 2023-12-19 (×2): qty 1

## 2023-12-19 MED ORDER — OXYCODONE HCL 5 MG PO TABS
5.0000 mg | ORAL_TABLET | ORAL | Status: AC | PRN
  Administered 2023-12-19: 5 mg via ORAL
  Filled 2023-12-19: qty 1

## 2023-12-19 MED ORDER — ENOXAPARIN SODIUM 80 MG/0.8ML IJ SOSY
0.5000 mg/kg | PREFILLED_SYRINGE | INTRAMUSCULAR | Status: DC
  Administered 2023-12-19 – 2023-12-22 (×3): 65 mg via SUBCUTANEOUS
  Filled 2023-12-19: qty 0.8
  Filled 2023-12-19 (×2): qty 0.65
  Filled 2023-12-19 (×3): qty 0.8

## 2023-12-19 NOTE — Progress Notes (Signed)
 Progress Note   Patient: Cody Alexander UJW:[id number] DOB: 08/25/1956 DOA: 12/18/2023     0 DOS: the patient was seen and examined on 12/19/2023   Brief hospital course: Cody Alexander is a 68 y.o. male with medical history significant for Diastolic CHF, COPD on 3-4 L O2, hypertension, CAD with stent, DM on insulin , OSA, morbid obesity, with several past hospitalizations for worsening respiratory failure secondary to COPD/CHF exacerbation, most recently from 2/20-10/01/23 who presents to the ED by EMS with chest pain and a couple episodes of vomiting.  He states that the chest pain goes across his entire chest and it is worse when he takes a deep breath.  Denies cough or shortness of breath.  He also states over the past week he has been very dizzy and had lots of falls, falling forward and hitting his head on sheetrock.  Also complains of worsening of a generalized rash that he has had for several years.  He came in by EMS who administered aspirin  en route.   ED course and data review: BP in the 140s to 150s with otherwise normal vitals   Labs notable for troponin 116-->123 and blood glucose 293 and creatinine of 1.3 up from baseline of 0.91 EKG, personally viewed and interpreted showing sinus at 86 with PVCs and no acute ST-T wave changes Chest x-ray was nonacute CT head nonacute Bilateral lower extremity venous ultrasound negative for DVT CTA PE protocol negative for PE or other acute thoracic abnormality  Assessment and Plan: Chest pain Elevated troponin CAD with history of stent angioplasty Continue to trend troponin to peak Nitroglycerin  sublingual as needed chest pain with morphine  for breakthrough Continue metoprolol  aspirin  and simvastatin Cardiologist on board and case discussed Continue heart healthy diet CT scan of the chest ruled out PE   Dizziness Frequent falls CT head nonacute Continue PT OT   Chronic heart failure with preserved ejection fraction (HFpEF)  (HCC) Has mild lower extremity edema Continue Bumex  and metoprolol    Uncontrolled type 2 diabetes mellitus with hyperglycemia, with long-term current use of insulin  (HCC) Blood glucose 293 on presentation Continue basal insulin  Sliding scale insulin  coverage   COPD (chronic obstructive pulmonary disease) (HCC) Chronic respiratory failure with hypoxia Obstructive sleep apnea Not acutely exacerbated Continue home inhalers with DuoNebs as needed CPAP nightly if desired   Essential hypertension Continue home metoprolol    Maculopapular rash, generalized Chronic Atarax  for itching.  Can consider Solu-Medrol    Obesity, Class III, BMI 40-49.9 (morbid obesity) (HCC) Complicating factor to overall prognosis and care      DVT prophylaxis: Lovenox    Consults: Cardiologist Dr. Meredeth Stallion   Advance Care Planning:   Code Status: Prior    Family Communication: none   Disposition Plan: Back to previous home environment  Subjective:  Patient seen and examined at bedside this morning Admits to some improvement in chest pain Patient has been seen by cardiologist however he is not willing to pursue any intervention at this time Denies nausea vomiting abdominal pain or chest pain  Physical Exam: Constitutional:      General: He is not in acute distress.    Appearance: He is morbidly obese.  HENT:     Head: Normocephalic and atraumatic.  Cardiovascular:     Rate and Rhythm: Normal rate and regular rhythm.     Heart sounds: Normal heart sounds.  Pulmonary:     Effort: Pulmonary effort is normal.     Breath sounds: Normal breath sounds.  Abdominal:  Palpations: Abdomen is soft.     Tenderness: There is no abdominal tenderness.  Skin:    Comments: Chronic appearing rash on back and legs with scratch marks  Neurological:     General: No focal deficit present.     Mental Status: Mental status is at baseline.      Vitals:   12/19/23 1200 12/19/23 1230 12/19/23 1615 12/19/23 1621   BP: 112/89 103/75 (!) 133/91   Pulse: 70 65 74   Resp: 19 16 20    Temp:    98.1 F (36.7 C)  TempSrc:    Oral  SpO2: 95% 97% 96%   Weight:      Height:        Data Reviewed: CT scan of the chest reviewed did not show any PE    Latest Ref Rng & Units 12/18/2023    8:19 PM 10/01/2023    4:34 AM 09/30/2023    1:10 AM  CBC  WBC 4.0 - 10.5 K/uL 8.3  14.5  18.2   Hemoglobin 13.0 - 17.0 g/dL 16.1  09.6  04.5   Hematocrit 39.0 - 52.0 % 42.6  41.1  39.6   Platelets 150 - 400 K/uL 239  264  270        Latest Ref Rng & Units 12/18/2023    8:19 PM 09/30/2023    1:10 AM 09/29/2023    3:52 AM  BMP  Glucose 70 - 99 mg/dL 409  811  914   BUN 8 - 23 mg/dL 23  34  21   Creatinine 0.61 - 1.24 mg/dL 7.82  9.56  2.13   Sodium 135 - 145 mmol/L 137  135  133   Potassium 3.5 - 5.1 mmol/L 3.9  4.4  4.9   Chloride 98 - 111 mmol/L 90  94  93   CO2 22 - 32 mmol/L 38  30  31   Calcium  8.9 - 10.3 mg/dL 9.0  8.7  8.3      Time spent: 55 minutes  Author: Ezzard Holms, MD 12/19/2023 5:09 PM  For on call review [email or web address].

## 2023-12-19 NOTE — Consult Note (Signed)
 Cody Alexander is a 68 y.o. male  [id number]  Primary Cardiologist: Debborah Fairly Reason for Consultation: Chest pain  HPI: This is a 68 year old white male with a past medical history of HFpEF, COPD, hypertension and diabetes presented to the hospital with chest pain.  He says the chest pain is throughout his chest described as sharp pains whenever he takes deep breaths.  He also has been feeling dizzy and felt like he is going to pass out and fell on the floor few times.  He had a CTA chest which revealed no evidence of pulmonary embolism and also underwent CT of the head which was unremarkable.  I was asked to evaluate the patient because of chest pain.  Patient is normally followed at [facility] and has not followed up in my office at all.   Review of Systems: Shortness of breath and dizziness   Past Medical History:  Diagnosis Date   CHF (congestive heart failure) (HCC)    COPD (chronic obstructive pulmonary disease) (HCC)    Diabetes mellitus without complication (HCC)    Hypertension    Neuropathy     (Not in a hospital admission)     aspirin  EC  81 mg Oral Daily   atorvastatin   80 mg Oral Daily   bumetanide   3 mg Oral Daily   enoxaparin  (LOVENOX ) injection  0.5 mg/kg Subcutaneous Q24H   insulin  aspart  0-5 Units Subcutaneous QHS   insulin  aspart  0-9 Units Subcutaneous TID WC   insulin  glargine-yfgn  20 Units Subcutaneous Daily   metoprolol  succinate  25 mg Oral Daily   mometasone -formoterol   2 puff Inhalation BID    Infusions:   Allergies  Allergen Reactions   Erythromycin Anaphylaxis and Swelling    Had eye swelling with erythromycin during a time when he had perf ear drum.  Tolerated azithromycin .    Social History   Socioeconomic History   Marital status: Single    Spouse name: Not on file   Number of children: 1   Years of education: Not on file   Highest education level: 12th grade  Occupational History   Occupation: Retired  Tobacco  Use   Smoking status: Former    Types: Cigarettes   Smokeless tobacco: Never  Vaping Use   Vaping status: Never Used  Substance and Sexual Activity   Alcohol use: No   Drug use: Never   Sexual activity: Not on file  Other Topics Concern   Not on file  Social History Narrative   Not on file   Social Drivers of Health   Financial Resource Strain: Low Risk  (07/23/2023)   Overall Financial Resource Strain (CARDIA)    Difficulty of Paying Living Expenses: Not very hard  Food Insecurity: No Food Insecurity (09/29/2023)   Hunger Vital Sign    Worried About Running Out of Food in the Last Year: Never true    Ran Out of Food in the Last Year: Never true  Transportation Needs: No Transportation Needs (09/29/2023)   PRAPARE - Administrator, Civil Service (Medical): No    Lack of Transportation (Non-Medical): No  Recent Concern: Transportation Needs - Unmet Transportation Needs (07/23/2023)   PRAPARE - Administrator, Civil Service (Medical): Yes    Lack of Transportation (Non-Medical): No  Physical Activity: Inactive (09/11/2023)   [facility]   Exercise Vital Sign    Days of Exercise per Week: 0 days  Minutes of Exercise per Session: 0 min  Stress: Stress Concern Present (09/11/2023)   Received from [facility] - Occupational Stress Questionnaire    Feeling of Stress : To some extent  Social Connections: Socially Integrated (09/30/2023)   Social Connection and Isolation Panel [NHANES]    Frequency of Communication with Friends and Family: Three times a week    Frequency of Social Gatherings with Friends and Family: Three times a week    Attends Religious Services: More than 4 times per year    Active Member of Clubs or Organizations: Not on file    Attends Club or Organization Meetings: More than 4 times per year    Marital Status: Married  Recent Concern: Social Connections - Moderately Isolated  (09/11/2023)   Received from [facility]   Social Connection and Isolation Panel [NHANES]    Frequency of Communication with Friends and Family: More than three times a week    Frequency of Social Gatherings with Friends and Family: More than three times a week    Attends Religious Services: 1 to 4 times per year    Active Member of [employer] or Organizations: No    Attends Banker Meetings: Never    Marital Status: Divorced  Catering manager Violence: Not At Risk (09/29/2023)   Humiliation, Afraid, Rape, and Kick questionnaire    Fear of Current or Ex-Partner: No    Emotionally Abused: No    Physically Abused: No    Sexually Abused: No    Family History  Problem Relation Age of Onset   Stroke Mother    Hypertension Father     PHYSICAL EXAM: Vitals:   12/19/23 1021 12/19/23 1100  BP: 104/68 117/78  Pulse: 84 78  Resp:  14  Temp:  98.2 F (36.8 C)  SpO2:  97%    No intake or output data in the 24 hours ending 12/19/23 1155  General:  Well appearing. No respiratory difficulty HEENT: normal Neck: supple. no JVD. Carotids 2+ bilat; no bruits. No lymphadenopathy or thryomegaly appreciated. Cor: PMI nondisplaced. Regular rate & rhythm. No rubs, gallops or murmurs. Lungs: clear Abdomen: soft, nontender, nondistended. No hepatosplenomegaly. No bruits or masses. Good bowel sounds. Extremities: no cyanosis, clubbing, rash, edema Neuro: alert & oriented x 3, cranial nerves grossly intact. moves all 4 extremities w/o difficulty. Affect pleasant.  ECG: Normal sinus rhythm with nonspecific ST-T changes no acute changes  Results for orders placed or performed during the hospital encounter of 12/18/23 (from the past 24 hours)  Basic metabolic panel     Status: Abnormal   Collection Time: 12/18/23  8:19 PM  Result Value Ref Range   Sodium 137 135 - 145 mmol/L   Potassium 3.9 3.5 - 5.1 mmol/L   Chloride 90 (L) 98 - 111 mmol/L   CO2 38 (H) 22 - 32 mmol/L   Glucose, Bld  293 (H) 70 - 99 mg/dL   BUN 23 8 - 23 mg/dL   Creatinine, Ser 1.61 (H) 0.61 - 1.24 mg/dL   Calcium  9.0 8.9 - 10.3 mg/dL   GFR, Estimated >09 >60 mL/min   Anion gap 9 5 - 15  CBC     Status: None   Collection Time: 12/18/23  8:19 PM  Result Value Ref Range   WBC 8.3 4.0 - 10.5 K/uL   RBC 4.60 4.22 - 5.81 MIL/uL   Hemoglobin 13.6 13.0 - 17.0 g/dL   HCT 42.6  39.0 - 52.0 %   MCV 92.6 80.0 - 100.0 fL   MCH 29.6 26.0 - 34.0 pg   MCHC 31.9 30.0 - 36.0 g/dL   RDW 16.1 09.6 - 04.5 %   Platelets 239 150 - 400 K/uL   nRBC 0.0 0.0 - 0.2 %  Troponin I (High Sensitivity)     Status: Abnormal   Collection Time: 12/18/23  8:19 PM  Result Value Ref Range   Troponin I (High Sensitivity) 116 (HH) <18 ng/L  Troponin I (High Sensitivity)     Status: Abnormal   Collection Time: 12/18/23 11:05 PM  Result Value Ref Range   Troponin I (High Sensitivity) 123 (HH) <18 ng/L  CK     Status: None   Collection Time: 12/18/23 11:05 PM  Result Value Ref Range   Total CK 63 49 - 397 U/L  Urinalysis, Complete w Microscopic -Urine, Clean Catch     Status: Abnormal   Collection Time: 12/19/23 12:00 AM  Result Value Ref Range   Color, Urine YELLOW (A) YELLOW   APPearance CLEAR (A) CLEAR   Specific Gravity, Urine 1.017 1.005 - 1.030   pH 5.0 5.0 - 8.0   Glucose, UA 150 (A) NEGATIVE mg/dL   Hgb urine dipstick NEGATIVE NEGATIVE   Bilirubin Urine NEGATIVE NEGATIVE   Ketones, ur NEGATIVE NEGATIVE mg/dL   Protein, ur NEGATIVE NEGATIVE mg/dL   Nitrite NEGATIVE NEGATIVE   Leukocytes,Ua TRACE (A) NEGATIVE   RBC / HPF 0-5 0 - 5 RBC/hpf   WBC, UA 6-10 0 - 5 WBC/hpf   Bacteria, UA NONE SEEN NONE SEEN   Squamous Epithelial / HPF 0-5 0 - 5 /HPF   Mucus PRESENT   CBG monitoring, ED     Status: Abnormal   Collection Time: 12/19/23 11:22 AM  Result Value Ref Range   Glucose-Capillary 292 (H) 70 - 99 mg/dL   CT Angio Chest PE W and/or Wo Contrast Result Date: 12/19/2023 CLINICAL DATA:  Pulmonary embolism suspected  EXAM: CT ANGIOGRAPHY CHEST WITH CONTRAST TECHNIQUE: Multidetector CT imaging of the chest was performed using the standard protocol during bolus administration of intravenous contrast. Multiplanar CT image reconstructions and MIPs were obtained to evaluate the vascular anatomy. RADIATION DOSE REDUCTION: This exam was performed according to the departmental dose-optimization program which includes automated exposure control, adjustment of the mA and/or kV according to patient size and/or use of iterative reconstruction technique. CONTRAST:  OMNIPAQUE  IOHEXOL  350 MG/ML SOLN COMPARISON:  None Available. FINDINGS: Cardiovascular: Satisfactory opacification of the pulmonary arteries to the segmental level. No evidence of pulmonary embolism. Normal heart size. No pericardial effusion. Mediastinum/Nodes: No enlarged mediastinal, hilar, or axillary lymph nodes. Thyroid gland, trachea, and esophagus demonstrate no significant findings. Lungs/Pleura: Lungs are clear. No pleural effusion or pneumothorax. Upper Abdomen: No acute abnormality. Musculoskeletal: No chest wall abnormality. No acute or significant osseous findings. Other: Unchanged inclusion cyst in the midline upper back. Review of the MIP images confirms the above findings. IMPRESSION: No pulmonary embolus or other acute thoracic abnormality. Electronically Signed   By: Juanetta Nordmann M.D.   On: 12/19/2023 02:38   US  Venous Img Lower Bilateral Result Date: 12/19/2023 CLINICAL DATA:  erythema, pain EXAM: BILATERAL LOWER EXTREMITY VENOUS DOPPLER ULTRASOUND TECHNIQUE: Gray-scale sonography with graded compression, as well as color Doppler and duplex ultrasound were performed to evaluate the lower extremity deep venous systems from the level of the common femoral vein and including the common femoral, femoral, profunda femoral, popliteal and calf veins  including the posterior tibial, peroneal and gastrocnemius veins when visible. The superficial great saphenous  vein was also interrogated. Spectral Doppler was utilized to evaluate flow at rest and with distal augmentation maneuvers in the common femoral, femoral and popliteal veins. COMPARISON:  None Available. FINDINGS: RIGHT LOWER EXTREMITY Common Femoral Vein: No evidence of thrombus. Normal compressibility, respiratory phasicity and response to augmentation. Saphenofemoral Junction: No evidence of thrombus. Normal compressibility and flow on color Doppler imaging. Profunda Femoral Vein: No evidence of thrombus. Normal compressibility and flow on color Doppler imaging. Femoral Vein: No evidence of thrombus. Normal compressibility, respiratory phasicity and response to augmentation. Popliteal Vein: No evidence of thrombus. Normal compressibility, respiratory phasicity and response to augmentation. Calf Veins: No evidence of thrombus. Normal compressibility and flow on color Doppler imaging. Superficial Great Saphenous Vein: No evidence of thrombus. Normal compressibility. Venous Reflux:  None. Other Findings:  None. LEFT LOWER EXTREMITY Common Femoral Vein: No evidence of thrombus. Normal compressibility, respiratory phasicity and response to augmentation. Saphenofemoral Junction: No evidence of thrombus. Normal compressibility and flow on color Doppler imaging. Profunda Femoral Vein: No evidence of thrombus. Normal compressibility and flow on color Doppler imaging. Femoral Vein: No evidence of thrombus. Normal compressibility, respiratory phasicity and response to augmentation. Popliteal Vein: No evidence of thrombus. Normal compressibility, respiratory phasicity and response to augmentation. Calf Veins: No evidence of thrombus. Normal compressibility and flow on color Doppler imaging. Superficial Great Saphenous Vein: No evidence of thrombus. Normal compressibility. Venous Reflux:  None. Other Findings:  None. IMPRESSION: No evidence of deep venous thrombosis in either lower extremity. Electronically Signed   By:  Morgane  Naveau M.D.   On: 12/19/2023 00:31   CT HEAD WO CONTRAST ( ) Result Date: 12/18/2023 CLINICAL DATA:  Dizziness and head trauma EXAM: CT HEAD WITHOUT CONTRAST TECHNIQUE: Contiguous axial images were obtained from the base of the skull through the vertex without intravenous contrast. RADIATION DOSE REDUCTION: This exam was performed according to the departmental dose-optimization program which includes automated exposure control, adjustment of the mA and/or kV according to patient size and/or use of iterative reconstruction technique. COMPARISON:  01/08/2023 FINDINGS: Brain: No mass,hemorrhage or extra-axial collection. Normal appearance of the parenchyma and CSF spaces. Vascular: No hyperdense vessel or unexpected vascular calcification. Skull: The visualized skull base, calvarium and extracranial soft tissues are normal. Sinuses/Orbits: Right sphenoid sinus opacification.  Normal orbits. Other: None. IMPRESSION: 1. No acute intracranial abnormality. 2. Right sphenoid sinus opacification. Electronically Signed   By: Juanetta Nordmann M.D.   On: 12/18/2023 23:30   DG Chest 2 View Result Date: 12/18/2023 CLINICAL DATA:  chest pain Pt brought in via ems from home with chest pain. Sx for 1 week. Pt on 3 liters oxygen at home. No cough. Hx copd chf. Pt has swelling to both lower legs. EXAM: CHEST - 2 VIEW COMPARISON:  Chest x-ray 09/28/2023, CT chest 01/05/2023 FINDINGS: The heart and mediastinal contours are unchanged. No focal consolidation. No pulmonary edema. No pleural effusion. No pneumothorax. No acute osseous abnormality. IMPRESSION: No active cardiopulmonary disease. Electronically Signed   By: Morgane  Naveau M.D.   On: 12/18/2023 21:43     ASSESSMENT AND PLAN: #1 atypical chest pain with deep inspiration most likely pleuritic.  EKG is unremarkable.  Troponins are 79, 116, and 123.  Patient underwent cardiac catheterization on 02/27/2018 which showed 30% mid LAD the rest of the coronaries were  normal with normal ejection fraction.  Chest pain is probably noncardiac with troponin borderline.  Patient is not  keen to have further workup such as cardiac catheterization.  Patient can go home with follow-up in the office on Monday at 9 AM. #2 presyncope.  CT head and CTA chest were unremarkable.  Patient might be slightly dehydrated and advise giving some fluids.  Also on labs patient chloride is  low.  His creatinine is 1.30 and BUN is 34.  He may have prerenal azotemia.  Thank you very much for follow-up.  Zayven Powe Meredeth Stallion

## 2023-12-19 NOTE — Assessment & Plan Note (Signed)
 Chronic Atarax  for itching.  Can consider Solu-Medrol 

## 2023-12-19 NOTE — Assessment & Plan Note (Signed)
 Continue home metoprolol.

## 2023-12-19 NOTE — Assessment & Plan Note (Signed)
 Frequent falls CT head nonacute Will get CK PT eval

## 2023-12-19 NOTE — Progress Notes (Signed)
 Anticoagulation monitoring(Lovenox ):  68 yo male ordered Lovenox  40 mg Q24h    Filed Weights   12/19/23 0440  Weight: 131.1 kg (289 lb)   BMI 38.1   Lab Results  Component Value Date   CREATININE 1.30 (H) 12/18/2023   CREATININE 1.20 09/30/2023   CREATININE 1.16 09/29/2023   Estimated Creatinine Clearance: 79.4 mL/min (A) (by C-G formula based on SCr of 1.3 mg/dL (H)). Hemoglobin & Hematocrit     Component Value Date/Time   HGB 13.6 12/18/2023 2019   HGB 15.5 08/20/2013 0656   HCT 42.6 12/18/2023 2019   HCT 45.7 08/20/2013 0656     Per Protocol for Patient with estCrcl > 30 ml/min and BMI > 30, will transition to Lovenox  65 mg Q24h.

## 2023-12-19 NOTE — ED Notes (Signed)
 Pt c/o back itching and rash. States its been going on for years. PRN medication will be given

## 2023-12-19 NOTE — Assessment & Plan Note (Signed)
 Chronic respiratory failure with hypoxia Obstructive sleep apnea Not acutely exacerbated Continue home inhalers with DuoNebs as needed CPAP nightly if desired

## 2023-12-19 NOTE — Progress Notes (Signed)
 OT Cancellation Note  Patient Details Name: Darby Shadwick MRN: [id number] DOB: 1956-04-10   Cancelled Treatment:    Reason Eval/Treat Not Completed: Fatigue/lethargy limiting ability to participate. Pt sound asleep upon author's arrival. Will re-attempt at later date when pt able to participate.   Krystle Polcyn L. Rubin Dais, OTR/L  12/19/23, 4:04 PM

## 2023-12-19 NOTE — Care Management Obs Status (Signed)
 MEDICARE OBSERVATION STATUS NOTIFICATION   Patient Details  Name: Cody Alexander MRN: [id number] Date of Birth: 12-31-1955   Medicare Observation Status Notification Given:  Yes    [name], RN 12/19/2023, 11:22 AM

## 2023-12-19 NOTE — Assessment & Plan Note (Addendum)
 Elevated troponin CAD with history of stent angioplasty Continue to trend troponin to peak Nitroglycerin  sublingual as needed chest pain with morphine  for breakthrough Continue metoprolol  aspirin  and simvastatin Cardiology consult N.p.o. in case of stress test Patient states he will not want cardiac cath

## 2023-12-19 NOTE — Assessment & Plan Note (Signed)
 Blood glucose 293 Continue basal insulin  Sliding scale insulin  coverage

## 2023-12-19 NOTE — Assessment & Plan Note (Signed)
 Has mild lower extremity edema Continue Bumex  and metoprolol 

## 2023-12-19 NOTE — H&P (Signed)
 History and Physical    Patient: Cody Alexander MVH:[id number] DOB: 06-Sep-1955 DOA: 12/18/2023 DOS: the patient was seen and examined on 12/19/2023 PCP: [facility]  Patient coming from: Home  Chief Complaint:  Chief Complaint  Patient presents with   Chest Pain    HPI: Cody Alexander is a 68 y.o. male with medical history significant for Diastolic CHF, COPD on 3-4 L O2, hypertension, CAD with stent, DM on insulin , OSA, morbid obesity, with several past hospitalizations for worsening respiratory failure secondary to COPD/CHF exacerbation, most recently from 2/20-10/01/23 who presents to the ED by EMS with chest pain and a couple episodes of vomiting.  He states that the chest pain goes across his entire chest and it is worse when he takes a deep breath.  Denies cough or shortness of breath.  He also states over the past week he has been very dizzy and had lots of falls, falling forward and hitting his head on sheetrock.  Also complains of worsening of a generalized rash that he has had for several years.  He came in by EMS who administered aspirin  en route.   ED course and data review: BP in the 140s to 150s with otherwise normal vitals  Labs notable for troponin 116-->123 and blood glucose 293 and creatinine of 1.3 up from baseline of 0.91 EKG, personally viewed and interpreted showing sinus at 86 with PVCs and no acute ST-T wave changes Chest x-ray was nonacute CT head nonacute Bilateral lower extremity venous ultrasound negative for DVT CTA PE protocol negative for PE or other acute thoracic abnormality  Patient pain was treated with morphine  and oxycodone  send Hospitalist consulted for admission for chest pain workup   Review of Systems: As mentioned in the history of present illness. All other systems reviewed and are negative.  Past Medical History:  Diagnosis Date   CHF (congestive heart failure) (HCC)    COPD (chronic obstructive pulmonary disease) (HCC)     Diabetes mellitus without complication (HCC)    Hypertension    Neuropathy    Past Surgical History:  Procedure Laterality Date   LEFT HEART CATH AND CORONARY ANGIOGRAPHY Right 02/27/2018   Procedure: LEFT HEART CATH AND CORONARY ANGIOGRAPHY;  Surgeon: Cherrie Cornwall, MD;  Location: [facility] INVASIVE CV LAB;  Service: Cardiovascular;  Laterality: Right;   Social History:  reports that he has quit smoking. His smoking use included cigarettes. He has never used smokeless tobacco. He reports that he does not drink alcohol and does not use drugs.  Allergies  Allergen Reactions   Erythromycin Anaphylaxis and Swelling    Had eye swelling with erythromycin during a time when he had perf ear drum.  Tolerated azithromycin .    Family History  Problem Relation Age of Onset   Stroke Mother    Hypertension Father     Prior to Admission medications   Medication Sig Start Date End Date Taking? Authorizing Provider  cephALEXin  (KEFLEX ) 500 MG capsule Take 1 capsule (500 mg total) by mouth 3 (three) times daily. 12/18/23  Yes Ruth Cove, MD  oxyCODONE  (ROXICODONE ) 5 MG immediate release tablet Take 1 tablet (5 mg total) by mouth every 8 (eight) hours as needed. 12/18/23 12/17/24 Yes Ruth Cove, MD  albuterol  (PROVENTIL  HFA) [phone] Base) MCG/ACT inhaler Inhale 2 puffs into the lungs every 4 (four) hours as needed. 06/19/23   Garrison Kanner, MD  ASPIRIN  EC 81 MG EC tablet Take 81 mg by mouth daily. 11/15/22   [provider]  atorvastatin  (LIPITOR ) 80 MG tablet Take 1 tablet by mouth daily. 04/24/23 04/23/24  [provider]  budesonide -formoterol  (SYMBICORT ) 160-4.5 MCG/ACT inhaler Inhale 2 puffs into the lungs 2 (two) times daily. 06/19/23   Garrison Kanner, MD  bumetanide  (BUMEX ) 1 MG tablet Take 3 mg by mouth daily.    [provider]  chlorpheniramine-HYDROcodone  (TUSSIONEX) 10-8 MG/5ML Take 5 mLs by mouth every 6 (six) hours as needed for cough. 10/03/23   Tiajuana Fluke, MD  fluconazole  (DIFLUCAN ) 200 MG tablet Take 1 tablet (200 mg total) by mouth once a week. On Saturdays. 08/09/23   Garrison Kanner, MD  hydrocortisone  cream 1 % Apply topically 4 (four) times daily. 08/03/23   Garrison Kanner, MD  hydrOXYzine  (ATARAX ) 25 MG tablet Take by mouth every 6 (six) hours as needed for itching. 07/06/23   [provider]  insulin  lispro (HUMALOG ) 100 UNIT/ML KwikPen Inject 10 Units into the skin 3 (three) times daily with meals. 06/19/23 09/28/23  Garrison Kanner, MD  ipratropium-albuterol  (DUONEB) 0.5-2.5 (3) MG/3ML SOLN Inhale 3 mLs by nebulization once every 6 (six) hours as needed. 06/19/23   Garrison Kanner, MD  LANTUS  SOLOSTAR 100 UNIT/ML Solostar Pen Inject 20 Units into the skin daily. 10/03/23 01/01/24  Tiajuana Fluke, MD  metoprolol  succinate (TOPROL -XL) 25 MG 24 hr tablet Take 1 tablet (25 mg total) by mouth daily. 10/03/23 01/01/24  Tiajuana Fluke, MD  Omeprazole 20 MG TBEC Take 1 tablet by mouth every morning. 02/24/23   [provider]    Physical Exam: Vitals:   12/19/23 0100 12/19/23 0130 12/19/23 0237 12/19/23 0320  BP: (!) 143/97 (!) 141/96 (!) 147/91 (!) 153/98  Pulse: 76 73 75 86  Resp: (!) 23 19 18 18   Temp:      TempSrc:      SpO2: 94% 97% 100% 93%   Physical Exam Vitals and nursing note reviewed.  Constitutional:      General: He is not in acute distress.    Appearance: He is morbidly obese.     Comments: Conversational dyspnea.  Patient scratching his legs arms and back  HENT:     Head: Normocephalic and atraumatic.  Cardiovascular:     Rate and Rhythm: Normal rate and regular rhythm.     Heart sounds: Normal heart sounds.  Pulmonary:     Effort: Pulmonary effort is normal.     Breath sounds: Normal breath sounds.  Abdominal:     Palpations: Abdomen is soft.     Tenderness: There is no abdominal tenderness.  Skin:    Comments: Chronic appearing rash on back and legs with scratch marks  Neurological:     General: No focal  deficit present.     Mental Status: Mental status is at baseline.     Labs on Admission: I have personally reviewed following labs and imaging studies  CBC: Recent Labs  Lab 12/18/23 2019  WBC 8.3  HGB 13.6  HCT 42.6  MCV 92.6  PLT 239   Basic Metabolic Panel: Recent Labs  Lab 12/18/23 2019  NA 137  K 3.9  CL 90*  CO2 38*  GLUCOSE 293*  BUN 23  CREATININE 1.30*  CALCIUM  9.0   GFR: CrCl cannot be calculated (Unknown ideal weight.). Liver Function Tests: No results for input(s): "AST", "ALT", "ALKPHOS", "BILITOT", "PROT", "ALBUMIN" in the last 168 hours. No results for input(s): "LIPASE", "AMYLASE" in the last 168 hours. No results for input(s): "AMMONIA" in the last  168 hours. Coagulation Profile: No results for input(s): "INR", "PROTIME" in the last 168 hours. Cardiac Enzymes: No results for input(s): "CKTOTAL", "CKMB", "CKMBINDEX", "TROPONINI" in the last 168 hours. BNP (last 3 results) No results for input(s): "PROBNP" in the last 8760 hours. HbA1C: No results for input(s): "HGBA1C" in the last 72 hours. CBG: No results for input(s): "GLUCAP" in the last 168 hours. Lipid Profile: No results for input(s): "CHOL", "HDL", "LDLCALC", "TRIG", "CHOLHDL", "LDLDIRECT" in the last 72 hours. Thyroid Function Tests: No results for input(s): "TSH", "T4TOTAL", "FREET4", "T3FREE", "THYROIDAB" in the last 72 hours. Anemia Panel: No results for input(s): "VITAMINB12", "FOLATE", "FERRITIN", "TIBC", "IRON", "RETICCTPCT" in the last 72 hours. Urine analysis:    Component Value Date/Time   COLORURINE YELLOW (A) 12/19/2023 0000   APPEARANCEUR CLEAR (A) 12/19/2023 0000   APPEARANCEUR Clear 08/20/2013 0849   LABSPEC 1.017 12/19/2023 0000   LABSPEC 1.014 08/20/2013 0849   PHURINE 5.0 12/19/2023 0000   GLUCOSEU 150 (A) 12/19/2023 0000   GLUCOSEU Negative 08/20/2013 0849   HGBUR NEGATIVE 12/19/2023 0000   BILIRUBINUR NEGATIVE 12/19/2023 0000   BILIRUBINUR Negative 08/20/2013  0849   KETONESUR NEGATIVE 12/19/2023 0000   PROTEINUR NEGATIVE 12/19/2023 0000   NITRITE NEGATIVE 12/19/2023 0000   LEUKOCYTESUR TRACE (A) 12/19/2023 0000   LEUKOCYTESUR Negative 08/20/2013 0849    Radiological Exams on Admission: CT Angio Chest PE W and/or Wo Contrast Result Date: 12/19/2023 CLINICAL DATA:  Pulmonary embolism suspected EXAM: CT ANGIOGRAPHY CHEST WITH CONTRAST TECHNIQUE: Multidetector CT imaging of the chest was performed using the standard protocol during bolus administration of intravenous contrast. Multiplanar CT image reconstructions and MIPs were obtained to evaluate the vascular anatomy. RADIATION DOSE REDUCTION: This exam was performed according to the departmental dose-optimization program which includes automated exposure control, adjustment of the mA and/or kV according to patient size and/or use of iterative reconstruction technique. CONTRAST:  OMNIPAQUE  IOHEXOL  350 MG/ML SOLN COMPARISON:  None Available. FINDINGS: Cardiovascular: Satisfactory opacification of the pulmonary arteries to the segmental level. No evidence of pulmonary embolism. Normal heart size. No pericardial effusion. Mediastinum/Nodes: No enlarged mediastinal, hilar, or axillary lymph nodes. Thyroid gland, trachea, and esophagus demonstrate no significant findings. Lungs/Pleura: Lungs are clear. No pleural effusion or pneumothorax. Upper Abdomen: No acute abnormality. Musculoskeletal: No chest wall abnormality. No acute or significant osseous findings. Other: Unchanged inclusion cyst in the midline upper back. Review of the MIP images confirms the above findings. IMPRESSION: No pulmonary embolus or other acute thoracic abnormality. Electronically Signed   By: Juanetta Nordmann M.D.   On: 12/19/2023 02:38   US  Venous Img Lower Bilateral Result Date: 12/19/2023 CLINICAL DATA:  erythema, pain EXAM: BILATERAL LOWER EXTREMITY VENOUS DOPPLER ULTRASOUND TECHNIQUE: Gray-scale sonography with graded compression, as  well as color Doppler and duplex ultrasound were performed to evaluate the lower extremity deep venous systems from the level of the common femoral vein and including the common femoral, femoral, profunda femoral, popliteal and calf veins including the posterior tibial, peroneal and gastrocnemius veins when visible. The superficial great saphenous vein was also interrogated. Spectral Doppler was utilized to evaluate flow at rest and with distal augmentation maneuvers in the common femoral, femoral and popliteal veins. COMPARISON:  None Available. FINDINGS: RIGHT LOWER EXTREMITY Common Femoral Vein: No evidence of thrombus. Normal compressibility, respiratory phasicity and response to augmentation. Saphenofemoral Junction: No evidence of thrombus. Normal compressibility and flow on color Doppler imaging. Profunda Femoral Vein: No evidence of thrombus. Normal compressibility and flow on color Doppler imaging.  Femoral Vein: No evidence of thrombus. Normal compressibility, respiratory phasicity and response to augmentation. Popliteal Vein: No evidence of thrombus. Normal compressibility, respiratory phasicity and response to augmentation. Calf Veins: No evidence of thrombus. Normal compressibility and flow on color Doppler imaging. Superficial Great Saphenous Vein: No evidence of thrombus. Normal compressibility. Venous Reflux:  None. Other Findings:  None. LEFT LOWER EXTREMITY Common Femoral Vein: No evidence of thrombus. Normal compressibility, respiratory phasicity and response to augmentation. Saphenofemoral Junction: No evidence of thrombus. Normal compressibility and flow on color Doppler imaging. Profunda Femoral Vein: No evidence of thrombus. Normal compressibility and flow on color Doppler imaging. Femoral Vein: No evidence of thrombus. Normal compressibility, respiratory phasicity and response to augmentation. Popliteal Vein: No evidence of thrombus. Normal compressibility, respiratory phasicity and response  to augmentation. Calf Veins: No evidence of thrombus. Normal compressibility and flow on color Doppler imaging. Superficial Great Saphenous Vein: No evidence of thrombus. Normal compressibility. Venous Reflux:  None. Other Findings:  None. IMPRESSION: No evidence of deep venous thrombosis in either lower extremity. Electronically Signed   By: Morgane  Naveau M.D.   On: 12/19/2023 00:31   CT HEAD WO CONTRAST ( ) Result Date: 12/18/2023 CLINICAL DATA:  Dizziness and head trauma EXAM: CT HEAD WITHOUT CONTRAST TECHNIQUE: Contiguous axial images were obtained from the base of the skull through the vertex without intravenous contrast. RADIATION DOSE REDUCTION: This exam was performed according to the departmental dose-optimization program which includes automated exposure control, adjustment of the mA and/or kV according to patient size and/or use of iterative reconstruction technique. COMPARISON:  01/08/2023 FINDINGS: Brain: No mass,hemorrhage or extra-axial collection. Normal appearance of the parenchyma and CSF spaces. Vascular: No hyperdense vessel or unexpected vascular calcification. Skull: The visualized skull base, calvarium and extracranial soft tissues are normal. Sinuses/Orbits: Right sphenoid sinus opacification.  Normal orbits. Other: None. IMPRESSION: 1. No acute intracranial abnormality. 2. Right sphenoid sinus opacification. Electronically Signed   By: Juanetta Nordmann M.D.   On: 12/18/2023 23:30   DG Chest 2 View Result Date: 12/18/2023 CLINICAL DATA:  chest pain Pt brought in via ems from home with chest pain. Sx for 1 week. Pt on 3 liters oxygen at home. No cough. Hx copd chf. Pt has swelling to both lower legs. EXAM: CHEST - 2 VIEW COMPARISON:  Chest x-ray 09/28/2023, CT chest 01/05/2023 FINDINGS: The heart and mediastinal contours are unchanged. No focal consolidation. No pulmonary edema. No pleural effusion. No pneumothorax. No acute osseous abnormality. IMPRESSION: No active cardiopulmonary  disease. Electronically Signed   By: Morgane  Naveau M.D.   On: 12/18/2023 21:43     Data Reviewed: Relevant notes from primary care and specialist visits, past discharge summaries as available in EHR, including Care Everywhere. Prior diagnostic testing as pertinent to current admission diagnoses Updated medications and problem lists for reconciliation ED course, including vitals, labs, imaging, treatment and response to treatment Triage notes, nursing and pharmacy notes and ED provider's notes Notable results as noted in HPI   Assessment and Plan: * Chest pain Elevated troponin CAD with history of stent angioplasty Continue to trend troponin to peak Nitroglycerin  sublingual as needed chest pain with morphine  for breakthrough Continue metoprolol  aspirin  and simvastatin Cardiology consult N.p.o. in case of stress test Patient states he will not want cardiac cath  Dizziness Frequent falls CT head nonacute Will get CK PT eval  Chronic heart failure with preserved ejection fraction (HFpEF) (HCC) Has mild lower extremity edema Continue Bumex  and metoprolol   Uncontrolled type 2 diabetes mellitus  with hyperglycemia, with long-term current use of insulin  (HCC) Blood glucose 293 Continue basal insulin  Sliding scale insulin  coverage  COPD (chronic obstructive pulmonary disease) (HCC) Chronic respiratory failure with hypoxia Obstructive sleep apnea Not acutely exacerbated Continue home inhalers with DuoNebs as needed CPAP nightly if desired  Essential hypertension Continue home metoprolol   Maculopapular rash, generalized Chronic Atarax  for itching.  Can consider Solu-Medrol   Obesity, Class III, BMI 40-49.9 (morbid obesity) (HCC) Complicating factor to overall prognosis and care     DVT prophylaxis: Lovenox   Consults: Cardiologist Dr. Meredeth Stallion  Advance Care Planning:   Code Status: Prior   Family Communication: none  Disposition Plan: Back to previous home  environment  Severity of Illness: The appropriate patient status for this patient is OBSERVATION. Observation status is judged to be reasonable and necessary in order to provide the required intensity of service to ensure the patient's safety. The patient's presenting symptoms, physical exam findings, and initial radiographic and laboratory data in the context of their medical condition is felt to place them at decreased risk for further clinical deterioration. Furthermore, it is anticipated that the patient will be medically stable for discharge from the hospital within 2 midnights of admission.   Author: Lanetta Pion, MD 12/19/2023 4:18 AM  For on call review [email or web address].

## 2023-12-19 NOTE — Assessment & Plan Note (Signed)
 Complicating factor to overall prognosis and care

## 2023-12-19 NOTE — Progress Notes (Signed)
 PT Cancellation Note  Patient Details Name: Cody Alexander MRN: [id number] DOB: 23-Dec-1955   Cancelled Treatment:    Reason Eval/Treat Not Completed: Fatigue/lethargy limiting ability to participate (Order received, chart reviewed; eval commenced, but pt falls asleep without contant stimulation, 3x in first 60sec of my entry, immediately resumes heavy sleep breathing. Will attempt again at later date/time.)  11:42 AM, 12/19/23 Dawn Eth, PT, DPT Physical Therapist - [facility]  [phone] (ASCOM)    Meghan Tiemann C 12/19/2023, 11:42 AM

## 2023-12-20 ENCOUNTER — Observation Stay

## 2023-12-20 DIAGNOSIS — R072 Precordial pain: Secondary | ICD-10-CM

## 2023-12-20 DIAGNOSIS — R079 Chest pain, unspecified: Secondary | ICD-10-CM | POA: Diagnosis not present

## 2023-12-20 LAB — GLUCOSE, CAPILLARY
Glucose-Capillary: 233 mg/dL — ABNORMAL HIGH (ref 70–99)
Glucose-Capillary: 262 mg/dL — ABNORMAL HIGH (ref 70–99)
Glucose-Capillary: 196 mg/dL — ABNORMAL HIGH (ref 70–99)

## 2023-12-20 MED ORDER — OXYCODONE HCL 5 MG PO TABS
10.0000 mg | ORAL_TABLET | Freq: Four times a day (QID) | ORAL | Status: DC | PRN
  Administered 2023-12-20 – 2023-12-23 (×9): 10 mg via ORAL
  Filled 2023-12-20 (×10): qty 2

## 2023-12-20 MED ORDER — METHOCARBAMOL 500 MG PO TABS: 500.0000 mg | ORAL_TABLET | Freq: Once | ORAL | Status: DC

## 2023-12-20 NOTE — Evaluation (Signed)
 Occupational Therapy Evaluation Patient Details Name: Cody Alexander MRN: [id number] DOB: July 21, 1956 Today's Date: 12/20/2023   History of Present Illness   68 y.o. male with medical history significant for Diastolic CHF, COPD on 3-4 L O2, HTN, CAD with stent, DM on insulin , OSA, morbid obesity, with several past hospitalizations for worsening respiratory failure secondary to COPD/CHF exacerbation, most recently from 2/2-10/03/23 who presents to the ED by EMS with chest pain and a couple episodes of vomiting.     Clinical Impressions Pt seen for OT evaluation this date. Pt alert, oriented, and agreeable to session. Pt endorsing 9/10 back and BLE pain, with back itching. BLE noted to be somewhat discolored distal to the knees. Work up negative for DVT. RN notified of pain. Pt required increased time/effort to complete bed mobility, setup to don shoes seated EOB, and CGA for slow transfer to standing with RW. Unsteady requiring CGA with RW for short shuffled steps to recliner. Pt endorsing dizziness with standing and mild with sitting. Pt at high risk of falls and has extensive falls history. Pt demonstrates impaired BLE strength, balance, activity tolerance, and safety awareness. Pt will benefit from additional skilled OT services to maximize safety and independence.      If plan is discharge home, recommend the following:   A little help with walking and/or transfers;A little help with bathing/dressing/bathroom;Assistance with cooking/housework;Assist for transportation;Help with stairs or ramp for entrance     Functional Status Assessment   Patient has had a recent decline in their functional status and demonstrates the ability to make significant improvements in function in a reasonable and predictable amount of time.     Equipment Recommendations   None recommended by OT     Recommendations for Other Services         Precautions/Restrictions   Precautions Precautions:  Fall Recall of Precautions/Restrictions: Impaired Precaution/Restrictions Comments: high falls risk Restrictions Weight Bearing Restrictions Per Provider Order: No     Mobility Bed Mobility Overal bed mobility: Modified Independent             General bed mobility comments: increased effort and time to complete    Transfers Overall transfer level: Needs assistance Equipment used: Rolling walker (2 wheels) Transfers: Sit to/from Stand Sit to Stand: Contact guard assist           General transfer comment: increased time/effort      Balance Overall balance assessment: Needs assistance Sitting-balance support: Feet supported, No upper extremity supported Sitting balance-Leahy Scale: Fair     Standing balance support: Bilateral upper extremity supported, Reliant on assistive device for balance Standing balance-Leahy Scale: Poor                             ADL either performed or assessed with clinical judgement   ADL Overall ADL's : Needs assistance/impaired                     Lower Body Dressing: Sitting/lateral leans;Set up Lower Body Dressing Details (indicate cue type and reason): set up for donning shoes using figure 4 technique from seated position EOB, anticipate he would need assist for LB dressing involving standing 2/2 poor balance             Functional mobility during ADLs: Contact guard assist;Minimal assistance;Rolling walker (2 wheels);Cueing for safety       Vision         Perception  Praxis         Pertinent Vitals/Pain Pain Assessment Pain Assessment: 0-10 Pain Score: 9  Pain Location: back itchy, BLE Pain Descriptors / Indicators: Aching, Sore Pain Intervention(s): Limited activity within patient's tolerance, Monitored during session, Repositioned, Patient requesting pain meds-RN notified     Extremity/Trunk Assessment Upper Extremity Assessment Upper Extremity Assessment: Overall WFL for tasks  assessed   Lower Extremity Assessment Lower Extremity Assessment: Generalized weakness       Communication Communication Communication: Impaired Factors Affecting Communication: Hearing impaired   Cognition Arousal: Alert Behavior During Therapy: Impulsive Cognition: No apparent impairments                               Following commands: Impaired Following commands impaired: Follows multi-step commands inconsistently, Only follows one step commands consistently     Cueing  General Comments   Cueing Techniques: Verbal cues      Exercises     Shoulder Instructions      Home Living Family/patient expects to be discharged to:: Private residence Living Arrangements: Non-relatives/Friends (67yo male roommate)   Type of Home: Mobile home Home Access: Stairs to enter Entrance Stairs-Number of Steps: 4-5 Entrance Stairs-Rails: Left Home Layout: One level     Bathroom Shower/Tub: Tub/shower unit     Bathroom Accessibility: No   Home Equipment: Agricultural consultant (2 wheels);Cane - single point   Additional Comments: floor concentrator, and porttable concentrator with battery that often dies during [employer] outings - per recent admission      Prior Functioning/Environment Prior Level of Function : Independent/Modified Independent;History of Falls (last six months);Needs assist             Mobility Comments: Pt reports limited walking recently 2/2 weakness and multiple recent falls ADLs Comments: Pt reports increased difficulty with ADL and IADL 2/2 weakness and falls, (during last admission ~95mo ago, pt reported being mod indep with basic ADL but LB dressing is more difficult, endorses not always wearing his O2 at home  (supposed to wear 3L continuous), light meal prep, does NOT drive so has been paying friend to take him places. Uses weekly pill box but does endorse forgetting to take his medications.)    OT Problem List: Decreased  strength;Pain;Decreased safety awareness;Decreased activity tolerance;Impaired balance (sitting and/or standing);Decreased knowledge of use of DME or AE;Obesity   OT Treatment/Interventions: Self-care/ADL training;Therapeutic exercise;Therapeutic activities;DME and/or AE instruction;Patient/family education;Balance training      OT Goals(Current goals can be found in the care plan section)   Acute Rehab OT Goals Patient Stated Goal: feel better OT Goal Formulation: With patient Time For Goal Achievement: 01/03/24 Potential to Achieve Goals: Good   OT Frequency:  Min 2X/week    Co-evaluation              AM-PAC OT "6 Clicks" Daily Activity     Outcome Measure Help from another person eating meals?: None Help from another person taking care of personal grooming?: None Help from another person toileting, which includes using toliet, bedpan, or urinal?: A Little Help from another person bathing (including washing, rinsing, drying)?: A Little Help from another person to put on and taking off regular upper body clothing?: None Help from another person to put on and taking off regular lower body clothing?: A Little 6 Click Score: 21   End of Session Equipment Utilized During Treatment: Rolling walker (2 wheels);Oxygen Nurse Communication: Patient requests pain meds  Activity Tolerance:  Patient limited by pain Patient left: in chair;with call bell/phone within reach;with chair alarm set  OT Visit Diagnosis: Other abnormalities of gait and mobility (R26.89);Repeated falls (R29.6);Muscle weakness (generalized) (M62.81);Pain Pain - Right/Left: Right (both) Pain - part of body: Leg                Time: 1610-9604 OT Time Calculation (min): 16 min Charges:  OT General Charges $OT Visit: 1 Visit OT Evaluation $OT Eval Low Complexity: 1 Low  Berenda Breaker., MPH, MS, OTR/L ascom [phone] 12/20/23, 1:52 PM

## 2023-12-20 NOTE — Evaluation (Signed)
 Physical Therapy Evaluation Patient Details Name: Cody Alexander MRN: [id number] DOB: 1956-08-10 Today's Date: 12/20/2023  History of Present Illness  68 y.o. male with medical history significant for Diastolic CHF, COPD on 3-4 L O2, HTN, CAD with stent, DM on insulin , OSA, morbid obesity, with several past hospitalizations for worsening respiratory failure secondary to COPD/CHF exacerbation, most recently from 2/2-10/03/23 who presents to the ED by EMS with chest pain and a couple episodes of vomiting.   Clinical Impression  Patient agreeable to PT evaluation, received in recliner. Prior to admission, patient lives in mobile home with 4-5 steps to enter. Has roommate that is unable to provide assist. Reports extensive fall history, including endrses 4-5 falls over the past week. Patient endorsing pain, 9/10 at start of session in multiple locations, including low back and BLE. Pt reports pain begins in ankles and radiates to knees, workup was negative for DVT. RN notified of pain via secure chat, and patient's request for medication. Patient require total A to donn sock per PT encouragement to attempt to complete. Patient able to stand from recliner with CGA with use of RW, and stand step transfer with shuffled steps noted and increased reliance on use of RW. Patient reporting lightheadedness with poor standing tolerance. Vitals stable. Pt unable to ambulate further due to fatigue/pain. Overall patient seems to be at high fall risk during evaluation. Patient left in bed with bed alarm set, all needs in reach. Patient will benefit from skilled acute PT services to address functional impairments (see below for additional) and maximize functional mobility. Anticipate the need for follow up PT services upon acute hospital discharge. Will continue to follow acutely.            If plan is discharge home, recommend the following: A little help with walking and/or transfers;Help with stairs or ramp for  entrance;Assist for transportation   Can travel by private vehicle   Yes    Equipment Recommendations Other (comment) (TBD at next level of care)  Recommendations for Other Services       Functional Status Assessment Patient has had a recent decline in their functional status and demonstrates the ability to make significant improvements in function in a reasonable and predictable amount of time.     Precautions / Restrictions Precautions Precautions: Fall Recall of Precautions/Restrictions: Impaired Precaution/Restrictions Comments: high falls risk      Mobility  Bed Mobility Overal bed mobility: Modified Independent             General bed mobility comments: Pt able to complete sit > supine with increased effort and time to complete, patient with SOB after completion.    Transfers Overall transfer level: Needs assistance Equipment used: Rolling walker (2 wheels) Transfers: Sit to/from Stand, Bed to chair/wheelchair/BSC Sit to Stand: Contact guard assist   Step pivot transfers: Contact guard assist       General transfer comment: Pt able to stand from recliner with CGA, and able to stand step transfer with CGA, increased time/effort. Shuffled gait steps noted, cues to keep AD close. Increased effort overall. BP stable despite reports of lightheadedness.    Ambulation/Gait Ambulation/Gait assistance: Contact guard assist Gait Distance (Feet): 4 Feet Assistive device: Rolling walker (2 wheels)         General Gait Details: Pt able to take lateral steps along side bed, shuffled steps noted with use of RW, CGA. Pt with fatigue/pain and refuse additional ambulation distance this date.  Stairs  Wheelchair Mobility     Tilt Bed    Modified Rankin (Stroke Patients Only)       Balance Overall balance assessment: Needs assistance Sitting-balance support: Feet supported, No upper extremity supported Sitting balance-Leahy Scale: Fair      Standing balance support: Bilateral upper extremity supported, Reliant on assistive device for balance Standing balance-Leahy Scale: Poor Standing balance comment: increased reliance on AD                             Pertinent Vitals/Pain Pain Assessment Pain Assessment: 0-10 Pain Score: 9  Pain Location: BLE; Back Pain Descriptors / Indicators: Aching, Sore Pain Intervention(s): Limited activity within patient's tolerance, Monitored during session, Patient requesting pain meds-RN notified    Home Living Family/patient expects to be discharged to:: Private residence Living Arrangements: Non-relatives/Friends (58 y/o roomate) Available Help at Discharge: Available PRN/intermittently (reports may have PRN assist from friends; roommmate unable to assist patietn) Type of Home: Mobile home Home Access: Stairs to enter Entrance Stairs-Rails: Left Entrance Stairs-Number of Steps: 4-5   Home Layout: One level Home Equipment: Agricultural consultant (2 wheels);Cane - single point Additional Comments: floor concentrator, and porttable concentrator with battery that often dies during [employer] outings - per recent admission    Prior Function Prior Level of Function : Independent/Modified Independent;History of Falls (last six months);Needs assist             Mobility Comments: Pt reports limited walking due to weakness, use of RW. Pt reports multiple falls, (patient states 4-5 last week) ADLs Comments: Pt reports increased difficulty with ADL and IADL 2/2 weakness and falls, (during last admission ~41mo ago, pt reported being mod indep with basic ADL but LB dressing is more difficult, endorses not always wearing his O2 at home  (supposed to wear 3L continuous), light meal prep, does NOT drive so has been paying friend to take him places. Uses weekly pill box but does endorse forgetting to take his medications.)     Extremity/Trunk Assessment   Upper Extremity Assessment Upper Extremity  Assessment: Overall WFL for tasks assessed    Lower Extremity Assessment Lower Extremity Assessment: Generalized weakness       Communication   Communication Communication: Impaired Factors Affecting Communication: Hearing impaired    Cognition Arousal: Alert Behavior During Therapy: Impulsive   PT - Cognitive impairments: No apparent impairments                         Following commands: Impaired Following commands impaired: Follows multi-step commands inconsistently, Only follows one step commands consistently     Cueing Cueing Techniques: Verbal cues     General Comments      Exercises     Assessment/Plan    PT Assessment Patient needs continued PT services  PT Problem List Decreased strength;Decreased activity tolerance;Decreased mobility;Decreased balance;Pain;Decreased knowledge of use of DME       PT Treatment Interventions DME instruction;Gait training;Stair training;Functional mobility training;Therapeutic activities;Therapeutic exercise;Balance training    PT Goals (Current goals can be found in the Care Plan section)  Acute Rehab PT Goals Patient Stated Goal: get stronger; feel better and get rid of pain PT Goal Formulation: With patient Time For Goal Achievement: 01/03/24 Potential to Achieve Goals: Fair    Frequency Min 2X/week     Co-evaluation               AM-PAC PT "6 Clicks" Mobility  Outcome Measure Help needed turning from your back to your side while in a flat bed without using bedrails?: None Help needed moving from lying on your back to sitting on the side of a flat bed without using bedrails?: None Help needed moving to and from a bed to a chair (including a wheelchair)?: A Little Help needed standing up from a chair using your arms (e.g., wheelchair or bedside chair)?: A Little Help needed to walk in hospital room?: A Little Help needed climbing 3-5 steps with a railing? : A Lot 6 Click Score: 19    End of  Session Equipment Utilized During Treatment: Gait belt;Oxygen Activity Tolerance: Patient limited by fatigue Patient left: in bed;with bed alarm set;with call bell/phone within reach Nurse Communication: Mobility status;Patient requests pain meds PT Visit Diagnosis: Unsteadiness on feet (R26.81);Muscle weakness (generalized) (M62.81);Repeated falls (R29.6);Other abnormalities of gait and mobility (R26.89);Difficulty in walking, not elsewhere classified (R26.2)    Time: 1345-1400 PT Time Calculation (min) (ACUTE ONLY): 15 min   Charges:   PT Evaluation $PT Eval Low Complexity: 1 Low   PT General Charges $$ ACUTE PT VISIT: 1 Visit        Quillian Brunt Fairly, PT, DPT 12/20/23 2:17 PM

## 2023-12-20 NOTE — Progress Notes (Addendum)
 Progress Note   Patient: Cody Alexander OZH:[id number] DOB: November 16, 1955 DOA: 12/18/2023     0 DOS: the patient was seen and examined on 12/20/2023    Brief hospital course: Cody Alexander is a 68 y.o. male with medical history significant for Diastolic CHF, COPD on 3-4 L O2, hypertension, CAD with stent, DM on insulin , OSA, morbid obesity, with several past hospitalizations for worsening respiratory failure secondary to COPD/CHF exacerbation, most recently from 2/20-10/01/23 who presents to the ED by EMS with chest pain and a couple episodes of vomiting.  He states that the chest pain goes across his entire chest and it is worse when he takes a deep breath.  Denies cough or shortness of breath.  He also states over the past week he has been very dizzy and had lots of falls, falling forward and hitting his head on sheetrock.  Also complains of worsening of a generalized rash that he has had for several years.  He came in by EMS who administered aspirin  en route.   ED course and data review: BP in the 140s to 150s with otherwise normal vitals   Labs notable for troponin 116-->123 and blood glucose 293 and creatinine of 1.3 up from baseline of 0.91 EKG, personally viewed and interpreted showing sinus at 86 with PVCs and no acute ST-T wave changes Chest x-ray was nonacute CT head nonacute Bilateral lower extremity venous ultrasound negative for DVT CTA PE protocol negative for PE or other acute thoracic abnormality   Assessment and Plan: Chest pain Elevated troponin CAD with history of stent angioplasty Nitroglycerin  sublingual as needed chest pain with morphine  for breakthrough Continue metoprolol  aspirin  and simvastatin Cardiologist on board and case discussed Continue heart healthy diet CT scan of the chest ruled out PE   Ambulatory dysfunction with frequent falls CT head nonacute Continue PT OT   Chronic heart failure with preserved ejection fraction (HFpEF) (HCC) Has mild lower  extremity edema Continue Bumex  and metoprolol    Uncontrolled type 2 diabetes mellitus with hyperglycemia, with long-term current use of insulin  (HCC) Blood glucose 293 on presentation Continue basal insulin  Sliding scale insulin  coverage   COPD (chronic obstructive pulmonary disease) (HCC) Chronic respiratory failure with hypoxia Obstructive sleep apnea Not acutely exacerbated Continue home inhalers with DuoNebs as needed CPAP nightly if desired   Essential hypertension Continue home metoprolol    Maculopapular rash, generalized Chronic Atarax  for itching.  Can consider Solu-Medrol    Obesity, Class III, BMI 40-49.9 (morbid obesity) (HCC) Complicating factor to overall prognosis and care       DVT prophylaxis: Lovenox    Consults: Cardiologist Dr. Meredeth Stallion   Advance Care Planning:   Code Status: Prior    Family Communication: none   Disposition Plan: Back to previous home environment   Subjective:  Patient seen and examined at bedside this morning Was seen by PT OT today and found to be very unsteady on his feet CT scan of the lumbar spine did not show any fractures Denied nausea vomiting abdominal pain chest pain   Physical Exam: Constitutional:      General: He is not in acute distress.    Appearance: He is morbidly obese.  HENT:     Head: Normocephalic and atraumatic.  Cardiovascular:     Rate and Rhythm: Normal rate and regular rhythm.     Heart sounds: Normal heart sounds.  Pulmonary:     Effort: Pulmonary effort is normal.     Breath sounds: Normal breath sounds.  Abdominal:  Palpations: Abdomen is soft.     Tenderness: There is no abdominal tenderness.  Skin:    Comments: Chronic appearing rash on back and legs with scratch marks  Neurological: Generalized weakness involving bilateral lower extremity     Disposition: Pending rehab placement, unsafe discharge at this time   Vitals:   12/20/23 0215 12/20/23 0505 12/20/23 0805 12/20/23 1140  BP:  126/69 123/81 126/89 131/75  Pulse: 74 71 74 75  Resp: 18 18 20 17   Temp: 98.1 F (36.7 C) 97.8 F (36.6 C) 98.4 F (36.9 C) 98.4 F (36.9 C)  TempSrc: Oral Oral Oral   SpO2: 96% 97% 98% 95%  Weight: 134 kg     Height:          Latest Ref Rng & Units 12/18/2023    8:19 PM 10/01/2023    4:34 AM 09/30/2023    1:10 AM  CBC  WBC 4.0 - 10.5 K/uL 8.3  14.5  18.2   Hemoglobin 13.0 - 17.0 g/dL 96.0  45.4  09.8   Hematocrit 39.0 - 52.0 % 42.6  41.1  39.6   Platelets 150 - 400 K/uL 239  264  270        Latest Ref Rng & Units 12/18/2023    8:19 PM 09/30/2023    1:10 AM 09/29/2023    3:52 AM  BMP  Glucose 70 - 99 mg/dL 119  147  829   BUN 8 - 23 mg/dL 23  34  21   Creatinine 0.61 - 1.24 mg/dL 5.62  1.30  8.65   Sodium 135 - 145 mmol/L 137  135  133   Potassium 3.5 - 5.1 mmol/L 3.9  4.4  4.9   Chloride 98 - 111 mmol/L 90  94  93   CO2 22 - 32 mmol/L 38  30  31   Calcium  8.9 - 10.3 mg/dL 9.0  8.7  8.3      Author: Ezzard Holms, MD 12/20/2023 2:36 PM  For on call review [email or web address].

## 2023-12-21 DIAGNOSIS — R079 Chest pain, unspecified: Secondary | ICD-10-CM | POA: Diagnosis not present

## 2023-12-21 DIAGNOSIS — R072 Precordial pain: Secondary | ICD-10-CM | POA: Diagnosis not present

## 2023-12-21 LAB — CBC WITH DIFFERENTIAL/PLATELET
Abs Immature Granulocytes: 0.01 10*3/uL (ref 0.00–0.07)
Basophils Absolute: 0.1 10*3/uL (ref 0.0–0.1)
Basophils Relative: 1 %
Eosinophils Absolute: 0.2 10*3/uL (ref 0.0–0.5)
Eosinophils Relative: 3 %
HCT: 41 % (ref 39.0–52.0)
Hemoglobin: 13.3 g/dL (ref 13.0–17.0)
Immature Granulocytes: 0 %
Lymphocytes Relative: 25 %
Lymphs Abs: 1.8 10*3/uL (ref 0.7–4.0)
MCH: 29.2 pg (ref 26.0–34.0)
MCHC: 32.4 g/dL (ref 30.0–36.0)
MCV: 89.9 fL (ref 80.0–100.0)
Monocytes Absolute: 0.7 10*3/uL (ref 0.1–1.0)
Monocytes Relative: 10 %
Neutro Abs: 4.3 10*3/uL (ref 1.7–7.7)
Neutrophils Relative %: 61 %
Platelets: 216 10*3/uL (ref 150–400)
RBC: 4.56 MIL/uL (ref 4.22–5.81)
RDW: 13.2 % (ref 11.5–15.5)
WBC: 7.1 10*3/uL (ref 4.0–10.5)
nRBC: 0 % (ref 0.0–0.2)

## 2023-12-21 LAB — BASIC METABOLIC PANEL WITH GFR
Anion gap: 7 (ref 5–15)
BUN: 22 mg/dL (ref 8–23)
CO2: 38 mmol/L — ABNORMAL HIGH (ref 22–32)
Calcium: 8.5 mg/dL — ABNORMAL LOW (ref 8.9–10.3)
Chloride: 90 mmol/L — ABNORMAL LOW (ref 98–111)
Creatinine, Ser: 1.25 mg/dL — ABNORMAL HIGH (ref 0.61–1.24)
GFR, Estimated: >60 mL/min (ref >60)
Glucose, Bld: 242 mg/dL — ABNORMAL HIGH (ref 70–99)
Potassium: 3.4 mmol/L — ABNORMAL LOW (ref 3.5–5.1)
Sodium: 135 mmol/L (ref 135–145)

## 2023-12-21 LAB — GLUCOSE, CAPILLARY
Glucose-Capillary: 191 mg/dL — ABNORMAL HIGH (ref 70–99)
Glucose-Capillary: 407 mg/dL — ABNORMAL HIGH (ref 70–99)
Glucose-Capillary: 152 mg/dL — ABNORMAL HIGH (ref 70–99)
Glucose-Capillary: 203 mg/dL — ABNORMAL HIGH (ref 70–99)

## 2023-12-21 MED ORDER — INSULIN ASPART 100 UNIT/ML IJ SOLN
3.0000 [IU] | Freq: Once | INTRAMUSCULAR | Status: AC
  Administered 2023-12-21: 3 [IU] via SUBCUTANEOUS
  Filled 2023-12-21: qty 1

## 2023-12-21 MED ORDER — INSULIN GLARGINE-YFGN 100 UNIT/ML ~~LOC~~ SOLN
20.0000 [IU] | Freq: Two times a day (BID) | SUBCUTANEOUS | Status: DC
  Administered 2023-12-21 – 2023-12-23 (×4): 20 [IU] via SUBCUTANEOUS
  Filled 2023-12-21 (×5): qty 0.2

## 2023-12-21 MED ORDER — HYDROCORTISONE 0.5 % EX CREA
TOPICAL_CREAM | Freq: Two times a day (BID) | CUTANEOUS | Status: DC
  Administered 2023-12-22: 1 via TOPICAL
  Filled 2023-12-21 (×2): qty 28.35

## 2023-12-21 MED ORDER — POTASSIUM CHLORIDE CRYS ER 20 MEQ PO TBCR
40.0000 meq | EXTENDED_RELEASE_TABLET | ORAL | Status: AC
  Administered 2023-12-21 (×2): 40 meq via ORAL
  Filled 2023-12-21 (×2): qty 2

## 2023-12-21 NOTE — Progress Notes (Signed)
 Progress Note   Patient: Cody Alexander WUJ:[id number] DOB: 11-24-1955 DOA: 12/18/2023     0 DOS: the patient was seen and examined on 12/21/2023    Brief hospital course: Shaddai Puglia is a 68 y.o. male with medical history significant for Diastolic CHF, COPD on 3-4 L O2, hypertension, CAD with stent, DM on insulin , OSA, morbid obesity, with several past hospitalizations for worsening respiratory failure secondary to COPD/CHF exacerbation, most recently from 2/20-10/01/23 who presents to the ED by EMS with chest pain and a couple episodes of vomiting.  He states that the chest pain goes across his entire chest and it is worse when he takes a deep breath.  Denies cough or shortness of breath.  He also states over the past week he has been very dizzy and had lots of falls, falling forward and hitting his head on sheetrock.  Also complains of worsening of a generalized rash that he has had for several years.  He came in by EMS who administered aspirin  en route.   ED course and data review: BP in the 140s to 150s with otherwise normal vitals   Labs notable for troponin 116-->123 and blood glucose 293 and creatinine of 1.3 up from baseline of 0.91 EKG, personally viewed and interpreted showing sinus at 86 with PVCs and no acute ST-T wave changes Chest x-ray was nonacute CT head nonacute Bilateral lower extremity venous ultrasound negative for DVT CTA PE protocol negative for PE or other acute thoracic abnormality   Assessment and Plan: Chest pain Elevated troponin CAD with history of stent angioplasty Nitroglycerin  sublingual as needed chest pain with morphine  for breakthrough Continue metoprolol  aspirin  and simvastatin Cardiologist on board and case discussed Continue heart healthy diet CT scan of the chest ruled out PE   Ambulatory dysfunction with frequent falls CT head nonacute Continue PT OT   Chronic heart failure with preserved ejection fraction (HFpEF) (HCC) Has mild lower  extremity edema Continue Bumex  and metoprolol    Uncontrolled type 2 diabetes mellitus with hyperglycemia, with long-term current use of insulin  (HCC) Blood glucose 293 on presentation Continue insulin  therapy Sliding scale insulin  coverage   COPD (chronic obstructive pulmonary disease) (HCC) Chronic respiratory failure with hypoxia Obstructive sleep apnea Not acutely exacerbated Continue home inhalers with DuoNebs as needed CPAP nightly if desired   Essential hypertension Continue home metoprolol    Maculopapular rash, generalized Chronic Atarax  for itching.  Can consider Solu-Medrol    Obesity, Class III, BMI 40-49.9 (morbid obesity) (HCC) Complicating factor to overall prognosis and care       DVT prophylaxis: Lovenox    Consults: Cardiologist Dr. Meredeth Stallion   Advance Care Planning:   Code Status: Prior    Family Communication: none   Disposition Plan: Back to previous home environment   Subjective:  Patient seen and examined at bedside this morning TOC working on placement Complained of itching at the back He follows up with dermatologist   Physical Exam: Constitutional:      General: He is not in acute distress.    Appearance: He is morbidly obese.  HENT:     Head: Normocephalic and atraumatic.  Cardiovascular:     Rate and Rhythm: Normal rate and regular rhythm.     Heart sounds: Normal heart sounds.  Pulmonary:     Effort: Pulmonary effort is normal.     Breath sounds: Normal breath sounds.  Abdominal:     Palpations: Abdomen is soft.     Tenderness: There is no abdominal tenderness.  Skin:  Comments: Chronic appearing rash on back and legs with scratch marks  Neurological: Generalized weakness involving bilateral lower extremity     Disposition: Pending rehab placement, unsafe discharge at this time     Vitals:   12/21/23 0532 12/21/23 0749 12/21/23 1156 12/21/23 1653  BP: (!) 127/95 (!) 134/92 (!) 122/91 123/86  Pulse: 66 68 67 71  Resp: 16 16 18  18   Temp: 98 F (36.7 C) 97.9 F (36.6 C) 97.7 F (36.5 C) 98.5 F (36.9 C)  TempSrc: Oral Oral Oral Oral  SpO2: 97% 96% 98% 95%  Weight:      Height:          Latest Ref Rng & Units 12/21/2023    5:31 AM 12/18/2023    8:19 PM 10/01/2023    4:34 AM  CBC  WBC 4.0 - 10.5 K/uL 7.1  8.3  14.5   Hemoglobin 13.0 - 17.0 g/dL 16.1  09.6  04.5   Hematocrit 39.0 - 52.0 % 41.0  42.6  41.1   Platelets 150 - 400 K/uL 216  239  264        Latest Ref Rng & Units 12/21/2023    5:31 AM 12/18/2023    8:19 PM 09/30/2023    1:10 AM  BMP  Glucose 70 - 99 mg/dL 409  811  914   BUN 8 - 23 mg/dL 22  23  34   Creatinine 0.61 - 1.24 mg/dL 7.82  9.56  2.13   Sodium 135 - 145 mmol/L 135  137  135   Potassium 3.5 - 5.1 mmol/L 3.4  3.9  4.4   Chloride 98 - 111 mmol/L 90  90  94   CO2 22 - 32 mmol/L 38  38  30   Calcium  8.9 - 10.3 mg/dL 8.5  9.0  8.7      Author: Ezzard Holms, MD 12/21/2023 5:23 PM  For on call review [email or web address].

## 2023-12-21 NOTE — Plan of Care (Signed)
   Problem: Education: Goal: Knowledge of General Education information will improve Description: Including pain rating scale, medication(s)/side effects and non-pharmacologic comfort measures Outcome: Progressing   Problem: Activity: Goal: Risk for activity intolerance will decrease Outcome: Progressing   Problem: Skin Integrity: Goal: Risk for impaired skin integrity will decrease Outcome: Progressing

## 2023-12-22 DIAGNOSIS — I214 Non-ST elevation (NSTEMI) myocardial infarction: Secondary | ICD-10-CM | POA: Diagnosis not present

## 2023-12-22 DIAGNOSIS — R072 Precordial pain: Secondary | ICD-10-CM | POA: Diagnosis not present

## 2023-12-22 DIAGNOSIS — R079 Chest pain, unspecified: Secondary | ICD-10-CM | POA: Diagnosis not present

## 2023-12-22 LAB — CBC WITH DIFFERENTIAL/PLATELET
Abs Immature Granulocytes: 0.01 10*3/uL (ref 0.00–0.07)
Basophils Absolute: 0.1 10*3/uL (ref 0.0–0.1)
Basophils Relative: 1 %
Eosinophils Absolute: 0.2 10*3/uL (ref 0.0–0.5)
Eosinophils Relative: 3 %
HCT: 42.5 % (ref 39.0–52.0)
Hemoglobin: 13.5 g/dL (ref 13.0–17.0)
Immature Granulocytes: 0 %
Lymphocytes Relative: 25 %
Lymphs Abs: 1.7 10*3/uL (ref 0.7–4.0)
MCH: 29 pg (ref 26.0–34.0)
MCHC: 31.8 g/dL (ref 30.0–36.0)
MCV: 91.4 fL (ref 80.0–100.0)
Monocytes Absolute: 0.8 10*3/uL (ref 0.1–1.0)
Monocytes Relative: 11 %
Neutro Abs: 4.1 10*3/uL (ref 1.7–7.7)
Neutrophils Relative %: 60 %
Platelets: 225 10*3/uL (ref 150–400)
RBC: 4.65 MIL/uL (ref 4.22–5.81)
RDW: 13.1 % (ref 11.5–15.5)
WBC: 6.8 10*3/uL (ref 4.0–10.5)
nRBC: 0 % (ref 0.0–0.2)

## 2023-12-22 LAB — GLUCOSE, CAPILLARY
Glucose-Capillary: 152 mg/dL — ABNORMAL HIGH (ref 70–99)
Glucose-Capillary: 221 mg/dL — ABNORMAL HIGH (ref 70–99)
Glucose-Capillary: 224 mg/dL — ABNORMAL HIGH (ref 70–99)
Glucose-Capillary: 245 mg/dL — ABNORMAL HIGH (ref 70–99)

## 2023-12-22 MED ORDER — DICLOFENAC SODIUM 1 % EX GEL
2.0000 g | Freq: Four times a day (QID) | CUTANEOUS | Status: DC
  Administered 2023-12-22 (×3): 2 g via TOPICAL
  Filled 2023-12-22: qty 100

## 2023-12-22 MED ORDER — ACETAMINOPHEN 325 MG PO TABS
650.0000 mg | ORAL_TABLET | ORAL | Status: DC | PRN
Start: 2023-12-22 — End: 2023-12-23
  Administered 2023-12-22 – 2023-12-23 (×2): 650 mg via ORAL
  Filled 2023-12-22 (×2): qty 2

## 2023-12-22 NOTE — TOC Progression Note (Signed)
 Transition of Care [facility]) - Progression Note    Patient Details  Name: Cody Alexander MRN: [id number] Date of Birth: February 12, 1956  [facility] CM/SW Contact  Baird Bombard, RN Phone Number: 12/22/2023, 3:47 PM  Clinical Narrative:    Spoke with patient at bedside regarding therapy's recommendation for SNF. Patient is not agreeable. He stated he has a 67 year old roommate that he has to go home to assist with her care. He receives oxygen via [facility]. Patient stated he will likely need a loaner tank due to no one being able to bring his tank from home. Patient is active with [facility] and would like to continue with his current services. He was advised they would contact him to scheduled his resumption of care at discharge.           Expected Discharge Plan and Services                                               Social Determinants of Health (SDOH) Interventions SDOH Screenings   Food Insecurity: No Food Insecurity (12/20/2023)  Housing: Low Risk  (12/20/2023)  Transportation Needs: No Transportation Needs (12/20/2023)  Utilities: Not At Risk (12/20/2023)  Alcohol Screen: Low Risk  (07/23/2023)  Financial Resource Strain: Low Risk  (07/23/2023)  Physical Activity: Inactive (09/11/2023)   Received from [facility]  Social Connections: Socially Integrated (12/20/2023)  Stress: Stress Concern Present (09/11/2023)   Received from [facility]  Tobacco Use: Medium Risk (12/18/2023)  Health Literacy: Medium Risk (09/11/2023)   Received from [facility]    Readmission Risk Interventions    09/29/2023    1:04 PM 07/23/2023    1:58 PM 06/25/2022   10:13 AM  Readmission Risk Prevention Plan  Transportation Screening Complete Complete Complete  PCP or Specialist Appt within 3-5 Days Complete Complete Complete  HRI or Home Care Consult Complete Complete Complete  Social Work Consult for Recovery Care Planning/Counseling Complete  Complete Complete  Palliative Care Screening Not Applicable Not Applicable Not Applicable  Medication Review Oceanographer) Complete Complete Complete

## 2023-12-22 NOTE — Progress Notes (Signed)
 SUBJECTIVE: Patient is feeling better this morning has infrequent chest pain now.   Vitals:   12/21/23 2346 12/22/23 0446 12/22/23 0746 12/22/23 1117  BP: 125/80 109/67 (!) 110/56 108/68  Pulse: 69 68 68 85  Resp: 20 20 17 18   Temp: 97.8 F (36.6 C) 97.8 F (36.6 C) 98.7 F (37.1 C) 97.7 F (36.5 C)  TempSrc: Oral     SpO2: 99% 98% 100% 93%  Weight:      Height:        Intake/Output Summary (Last 24 hours) at 12/22/2023 1119 Last data filed at 12/22/2023 0900 Gross per 24 hour  Intake 1200 ml  Output 200 ml  Net 1000 ml    LABS: Basic Metabolic Panel: Recent Labs    12/21/23 0531  NA 135  K 3.4*  CL 90*  CO2 38*  GLUCOSE 242*  BUN 22  CREATININE 1.25*  CALCIUM  8.5*   Liver Function Tests: No results for input(s): "AST", "ALT", "ALKPHOS", "BILITOT", "PROT", "ALBUMIN" in the last 72 hours. No results for input(s): "LIPASE", "AMYLASE" in the last 72 hours. CBC: Recent Labs    12/21/23 0531 12/22/23 0517  WBC 7.1 6.8  NEUTROABS 4.3 4.1  HGB 13.3 13.5  HCT 41.0 42.5  MCV 89.9 91.4  PLT 216 225   Cardiac Enzymes: No results for input(s): "CKTOTAL", "CKMB", "CKMBINDEX", "TROPONINI" in the last 72 hours. BNP: Invalid input(s): "POCBNP" D-Dimer: No results for input(s): "DDIMER" in the last 72 hours. Hemoglobin A1C: No results for input(s): "HGBA1C" in the last 72 hours. Fasting Lipid Panel: No results for input(s): "CHOL", "HDL", "LDLCALC", "TRIG", "CHOLHDL", "LDLDIRECT" in the last 72 hours. Thyroid Function Tests: No results for input(s): "TSH", "T4TOTAL", "T3FREE", "THYROIDAB" in the last 72 hours.  Invalid input(s): "FREET3" Anemia Panel: No results for input(s): "VITAMINB12", "FOLATE", "FERRITIN", "TIBC", "IRON", "RETICCTPCT" in the last 72 hours.   PHYSICAL EXAM General: Well developed, well nourished, in no acute distress HEENT:  Normocephalic and atramatic Neck:  No JVD.  Lungs: Clear bilaterally to auscultation and percussion. Heart: HRRR  . Normal S1 and S2 without gallops or murmurs.  Abdomen: Bowel sounds are positive, abdomen soft and non-tender  Msk:  Back normal, normal gait. Normal strength and tone for age. Extremities: No clubbing, cyanosis or edema.   Neuro: Alert and oriented X 3. Psych:  Good affect, responds appropriately  TELEMETRY: Sinus rhythm  ASSESSMENT AND PLAN: Atypical chest pain with borderline elevated troponin.  Chest pain is pleuritic.  Patient is feeling better less short of breath and infrequent chest pain cardiac point of view patient can be discharged with outpatient workup.  He agrees to abide and can give him follow-up this Friday at 9 AM.   ICD-10-CM   1. Weakness  R53.1     2. Cellulitis of lower extremity, unspecified laterality  L03.119       Principal Problem:   Chest pain Active Problems:   COPD (chronic obstructive pulmonary disease) (HCC)   Uncontrolled type 2 diabetes mellitus with hyperglycemia, with long-term current use of insulin  (HCC)   Essential hypertension   Obesity, Class III, BMI 40-49.9 (morbid obesity) (HCC)   OSA (obstructive sleep apnea)   Chronic respiratory failure with hypoxia (HCC)   Maculopapular rash, generalized   CAD with history of stent angioplasty   Chest pain   Chronic heart failure with preserved ejection fraction (HFpEF) (HCC)   Frequent falls   Dizziness    Debborah Fairly, MD, [facility] 12/22/2023 11:19 AM

## 2023-12-22 NOTE — Progress Notes (Signed)
 Physical Therapy Treatment Patient Details Name: Cody Alexander MRN: [id number] DOB: 03-26-1956 Today's Date: 12/22/2023   History of Present Illness Pt is a 68 y.o. male with medical history significant for diastolic CHF, COPD on 3-4 L O2, HTN, CAD with stent, DM on insulin , OSA, morbid obesity, with several past hospitalizations for worsening respiratory failure secondary to COPD/CHF exacerbation, most recently from 2/2-10/03/23 who presents to the ED by EMS with chest pain and a couple episodes of vomiting.    PT Comments  Pt was pleasant and motivated to participate during the session and put forth good effort throughout.  Pt somewhat impulsive but easily redirected with min verbal cuing.  Pt required no physical assistance with transfers but did require cuing and practice for proper sequencing to address poor eccentric control during stand to sit. Pt was able to perform three bouts of amb per below with his longest consecutive walk around 100 feet before needing a therapeutic rest break.  Pt was generally steady during gait but did require cuing for upright posture with amb closer to the RW for safety.  Pt reported no adverse symptoms during the session other than his BLE pain that did not worsen with activity with SpO2 and HR WNL throughout on 4LO2/min.  Pt will benefit from continued PT services upon discharge to safely address deficits listed in patient problem list for decreased caregiver assistance and eventual return to PLOF.         If plan is discharge home, recommend the following: A little help with walking and/or transfers;Help with stairs or ramp for entrance;Assist for transportation   Can travel by private vehicle     Yes  Equipment Recommendations  Other (comment) (TBD at next venue of care)    Recommendations for Other Services       Precautions / Restrictions Precautions Precautions: Fall Recall of Precautions/Restrictions: Intact Restrictions Weight Bearing  Restrictions Per Provider Order: No     Mobility  Bed Mobility               General bed mobility comments: NT, in recliner    Transfers Overall transfer level: Needs assistance Equipment used: Rolling walker (2 wheels) Transfers: Sit to/from Stand Sit to Stand: Contact guard assist           General transfer comment: Mod verbal cues for sequencing with fair concentric control but poor eccentric control    Ambulation/Gait Ambulation/Gait assistance: Contact guard assist Gait Distance (Feet): 40 Feet x 1, 100 Feet x 1, 60 Feet x 1  Assistive device: Rolling walker (2 wheels) Gait Pattern/deviations: Step-through pattern, Decreased step length - right, Decreased step length - left, Trunk flexed Gait velocity: decreased     General Gait Details: Mod verbal cues for amb closer to the RW with upright posture; slow cadence but steady with no overt LOB   Stairs             Wheelchair Mobility     Tilt Bed    Modified Rankin (Stroke Patients Only)       Balance Overall balance assessment: Needs assistance Sitting-balance support: Feet supported, No upper extremity supported Sitting balance-Leahy Scale: Good     Standing balance support: Bilateral upper extremity supported, Reliant on assistive device for balance, During functional activity Standing balance-Leahy Scale: Fair                              Communication Communication Communication: No apparent  difficulties  Cognition Arousal: Alert Behavior During Therapy: Impulsive   PT - Cognitive impairments: No apparent impairments                         Following commands: Intact Following commands impaired: Follows one step commands with increased time    Cueing Cueing Techniques: Verbal cues  Exercises Other Exercises Other Exercises: Pt education on sit to/from stand transfer sequencing with focus on improved eccentric control    General Comments         Pertinent Vitals/Pain Pain Assessment Pain Assessment: 0-10 Pain Score: 8  Pain Location: BLEs Pain Descriptors / Indicators: Aching, Sore Pain Intervention(s): Monitored during session, Patient requesting pain meds-RN notified, RN gave pain meds during session    Home Living                          Prior Function            PT Goals (current goals can now be found in the care plan section) Progress towards PT goals: Progressing toward goals    Frequency    Min 2X/week      PT Plan      Co-evaluation              AM-PAC PT "6 Clicks" Mobility   Outcome Measure  Help needed turning from your back to your side while in a flat bed without using bedrails?: None Help needed moving from lying on your back to sitting on the side of a flat bed without using bedrails?: None Help needed moving to and from a bed to a chair (including a wheelchair)?: A Little Help needed standing up from a chair using your arms (e.g., wheelchair or bedside chair)?: A Little Help needed to walk in hospital room?: A Little Help needed climbing 3-5 steps with a railing? : A Lot 6 Click Score: 19    End of Session Equipment Utilized During Treatment: Gait belt;Oxygen Activity Tolerance: Patient tolerated treatment well Patient left: in chair;with call bell/phone within reach Nurse Communication: Mobility status (Nursing stated no chair alarm required, pt fall score of 7) PT Visit Diagnosis: Unsteadiness on feet (R26.81);Muscle weakness (generalized) (M62.81);Repeated falls (R29.6);Other abnormalities of gait and mobility (R26.89);Difficulty in walking, not elsewhere classified (R26.2)     Time: 1634-1700 PT Time Calculation (min) (ACUTE ONLY): 26 min  Charges:    $Gait Training: 8-22 mins $Therapeutic Activity: 8-22 mins PT General Charges $$ ACUTE PT VISIT: 1 Visit                     D. Scott Zebulon Gantt PT, DPT 12/22/23, 5:18 PM

## 2023-12-22 NOTE — Progress Notes (Signed)
 Progress Note   Patient: Cody Alexander [id number]:[id number] DOB: February 14, 1956 DOA: 12/18/2023     0 DOS: the patient was seen and examined on 12/22/2023   Brief hospital course: Cody Alexander is a 68 y.o. male with medical history significant for Diastolic CHF, COPD on 3-4 L O2, hypertension, CAD with stent, DM on insulin , OSA, morbid obesity, with several past hospitalizations for worsening respiratory failure secondary to COPD/CHF exacerbation, most recently from 2/20-10/01/23 who presents to the ED by EMS with chest pain and a couple episodes of vomiting.  He states that the chest pain goes across his entire chest and it is worse when he takes a deep breath.  Denies cough or shortness of breath.  He also states over the past week he has been very dizzy and had lots of falls, falling forward and hitting his head on sheetrock.  Also complains of worsening of a generalized rash that he has had for several years.  He came in by EMS who administered aspirin  en route.   ED course and data review: BP in the 140s to 150s with otherwise normal vitals   Labs notable for troponin 116-->123 and blood glucose 293 and creatinine of 1.3 up from baseline of 0.91 EKG, personally viewed and interpreted showing sinus at 86 with PVCs and no acute ST-T wave changes Chest x-ray was nonacute CT head nonacute Bilateral lower extremity venous ultrasound negative for DVT CTA PE protocol negative for PE or other acute thoracic abnormality   Assessment and Plan: Chest pain Elevated troponin CAD with history of stent angioplasty Nitroglycerin  sublingual as needed chest pain with morphine  for breakthrough Continue metoprolol  aspirin  and simvastatin Cardiologist on board and case discussed Continue heart healthy diet CT scan of the chest ruled out PE   Ambulatory dysfunction with frequent falls CT head nonacute Continue PT OT   Chronic heart failure with preserved ejection fraction (HFpEF) (HCC) Has mild lower  extremity edema Continue Bumex  and metoprolol    Uncontrolled type 2 diabetes mellitus with hyperglycemia, with long-term current use of insulin  (HCC) Blood glucose 293 on presentation Continue insulin  therapy Sliding scale insulin  coverage   COPD (chronic obstructive pulmonary disease) (HCC) Chronic respiratory failure with hypoxia Obstructive sleep apnea Not acutely exacerbated Continue home inhalers with DuoNebs as needed CPAP nightly if desired   Essential hypertension Continue home metoprolol    Maculopapular rash, generalized Chronic Atarax  for itching.  Can consider Solu-Medrol    Obesity, Class III, BMI 40-49.9 (morbid obesity) (HCC) Complicating factor to overall prognosis and care       DVT prophylaxis: Lovenox    Consults: Cardiologist Dr. Meredeth Stallion   Advance Care Planning:   Code Status: Prior    Family Communication: none   Disposition Plan: Back to previous home environment   Subjective:  Patient seen and examined at bedside this morning Denies any acute overnight events Complain of some pain involving the knee and requested Voltaren  gel Denies nausea vomiting abdominal pain Patient still deciding whether he will go to rehab or not TOC working with patient   Physical Exam: Constitutional:      General: He is not in acute distress.    Appearance: He is morbidly obese.  HENT:     Head: Normocephalic and atraumatic.  Cardiovascular:     Rate and Rhythm: Normal rate and regular rhythm.     Heart sounds: Normal heart sounds.  Pulmonary:     Effort: Pulmonary effort is normal.     Breath sounds: Normal breath sounds.  Abdominal:  Palpations: Abdomen is soft.     Tenderness: There is no abdominal tenderness.  Skin:    Comments: Chronic appearing rash on back and legs with scratch marks  Neurological: Generalized weakness involving bilateral lower extremity     Disposition: Pending rehab placement, unsafe discharge at this time      Vitals:    12/22/23 0446 12/22/23 0746 12/22/23 1117 12/22/23 1641  BP: 109/67 (!) 110/56 108/68 128/86  Pulse: 68 68 85 76  Resp: 20 17 18 18   Temp: 97.8 F (36.6 C) 98.7 F (37.1 C) 97.7 F (36.5 C) 97.6 F (36.4 C)  TempSrc:    Axillary  SpO2: 98% 100% 93% 93%  Weight:      Height:          Latest Ref Rng & Units 12/22/2023    5:17 AM 12/21/2023    5:31 AM 12/18/2023    8:19 PM  CBC  WBC 4.0 - 10.5 K/uL 6.8  7.1  8.3   Hemoglobin 13.0 - 17.0 g/dL 16.1  09.6  04.5   Hematocrit 39.0 - 52.0 % 42.5  41.0  42.6   Platelets 150 - 400 K/uL 225  216  239        Latest Ref Rng & Units 12/21/2023    5:31 AM 12/18/2023    8:19 PM 09/30/2023    1:10 AM  BMP  Glucose 70 - 99 mg/dL 409  811  914   BUN 8 - 23 mg/dL 22  23  34   Creatinine 0.61 - 1.24 mg/dL 7.82  9.56  2.13   Sodium 135 - 145 mmol/L 135  137  135   Potassium 3.5 - 5.1 mmol/L 3.4  3.9  4.4   Chloride 98 - 111 mmol/L 90  90  94   CO2 22 - 32 mmol/L 38  38  30   Calcium  8.9 - 10.3 mg/dL 8.5  9.0  8.7      Author: Ezzard Holms, MD 12/22/2023 6:30 PM  For on call review [email or web address].

## 2023-12-22 NOTE — Plan of Care (Signed)

## 2023-12-23 DIAGNOSIS — R079 Chest pain, unspecified: Secondary | ICD-10-CM | POA: Diagnosis not present

## 2023-12-23 DIAGNOSIS — I214 Non-ST elevation (NSTEMI) myocardial infarction: Secondary | ICD-10-CM | POA: Diagnosis not present

## 2023-12-23 DIAGNOSIS — R072 Precordial pain: Secondary | ICD-10-CM | POA: Diagnosis not present

## 2023-12-23 LAB — CBC WITH DIFFERENTIAL/PLATELET
Abs Immature Granulocytes: 0.02 10*3/uL (ref 0.00–0.07)
Basophils Absolute: 0.1 10*3/uL (ref 0.0–0.1)
Basophils Relative: 1 %
Eosinophils Absolute: 0.2 10*3/uL (ref 0.0–0.5)
Eosinophils Relative: 3 %
HCT: 42.2 % (ref 39.0–52.0)
Hemoglobin: 13.6 g/dL (ref 13.0–17.0)
Immature Granulocytes: 0 %
Lymphocytes Relative: 27 %
Lymphs Abs: 1.9 10*3/uL (ref 0.7–4.0)
MCH: 29.2 pg (ref 26.0–34.0)
MCHC: 32.2 g/dL (ref 30.0–36.0)
MCV: 90.8 fL (ref 80.0–100.0)
Monocytes Absolute: 0.8 10*3/uL (ref 0.1–1.0)
Monocytes Relative: 12 %
Neutro Abs: 4.1 10*3/uL (ref 1.7–7.7)
Neutrophils Relative %: 57 %
Platelets: 221 10*3/uL (ref 150–400)
RBC: 4.65 MIL/uL (ref 4.22–5.81)
RDW: 13.1 % (ref 11.5–15.5)
WBC: 7.2 10*3/uL (ref 4.0–10.5)
nRBC: 0 % (ref 0.0–0.2)

## 2023-12-23 LAB — GLUCOSE, CAPILLARY: Glucose-Capillary: 122 mg/dL — ABNORMAL HIGH (ref 70–99)

## 2023-12-23 MED ORDER — OXYCODONE HCL 5 MG PO TABS: 5.0000 mg | ORAL_TABLET | Freq: Four times a day (QID) | ORAL | 0 refills | Status: AC | PRN

## 2023-12-23 MED ORDER — HYDROCORTISONE 1 % EX CREA: TOPICAL_CREAM | Freq: Four times a day (QID) | CUTANEOUS | Status: DC

## 2023-12-23 NOTE — Care Management (Signed)
 IV found dislodged by  patient,  attempt x 4 for reinsert, unsuccessful, patient condition stable, pending discharge to home in AM 12/23/23

## 2023-12-23 NOTE — Plan of Care (Signed)
  Problem: Education: Goal: Ability to describe self-care measures that may prevent or decrease complications (Diabetes Survival Skills Education) will improve Outcome: Progressing   Problem: Coping: Goal: Ability to adjust to condition or change in health will improve Outcome: Progressing   Problem: Fluid Volume: Goal: Ability to maintain a balanced intake and output will improve Outcome: Progressing   Problem: Metabolic: Goal: Ability to maintain appropriate glucose levels will improve Outcome: Progressing   Problem: Safety: Goal: Ability to remain free from injury will improve Outcome: Progressing   Problem: Pain Managment: Goal: General experience of comfort will improve and/or be controlled Outcome: Progressing   Problem: Skin Integrity: Goal: Risk for impaired skin integrity will decrease Outcome: Progressing    Plan of care ongoing, see MAR, see flowsheeet

## 2023-12-23 NOTE — Progress Notes (Signed)
 Occupational Therapy Treatment Patient Details Name: Cody Alexander MRN: [id number] DOB: [birth year] Today's Date: 12/23/2023   History of present illness Pt is a 68 y.o. male with medical history significant for diastolic CHF, COPD on 3-4 L O2, HTN, CAD with stent, DM on insulin , OSA, morbid obesity, with several past hospitalizations for worsening respiratory failure secondary to COPD/CHF exacerbation, most recently from 2/2-10/03/23 who presents to the ED by EMS with chest pain and a couple episodes of vomiting.   OT comments  Mr Calabria was seen for OT treatment on this date. Upon arrival to room pt in bed, agreeable to tx. Pt requires SETUP bathing in sitting. SUPERVISION for toilet t/f no AD use. SpO2 95% on baseline 4L [place]. Pt making good progress toward goals, will continue to follow POC. Discharge recommendation updated.       If plan is discharge home, recommend the following:  A little help with walking and/or transfers;A little help with bathing/dressing/bathroom;Assistance with cooking/housework;Assist for transportation;Help with stairs or ramp for entrance   Equipment Recommendations  None recommended by OT    Recommendations for Other Services      Precautions / Restrictions Precautions Precautions: Fall Recall of Precautions/Restrictions: Intact Restrictions Weight Bearing Restrictions Per Provider Order: No       Mobility Bed Mobility Overal bed mobility: Independent                  Transfers Overall transfer level: Independent                       Balance Overall balance assessment: Needs assistance Sitting-balance support: No upper extremity supported, Feet supported Sitting balance-Leahy Scale: Normal     Standing balance support: No upper extremity supported, During functional activity Standing balance-Leahy Scale: Fair                             ADL either performed or assessed with clinical judgement   ADL Overall  ADL's : Needs assistance/impaired                                       General ADL Comments: SETUP bathing in sitting. SUPERVISION for toilet t/f no AD use.     Communication Communication Communication: No apparent difficulties   Cognition Arousal: Alert Behavior During Therapy: Impulsive Cognition: No apparent impairments                               Following commands: Intact                      Pertinent Vitals/ Pain       Pain Assessment Pain Assessment: No/denies pain   Frequency  Min 2X/week        Progress Toward Goals  OT Goals(current goals can now be found in the care plan section)  Progress towards OT goals: Progressing toward goals  Acute Rehab OT Goals OT Goal Formulation: With patient Time For Goal Achievement: 01/03/24 Potential to Achieve Goals: Good ADL Goals Pt Will Perform Upper Body Dressing: sitting;standing;with modified independence Pt Will Perform Lower Body Dressing: sitting/lateral leans;sit to/from stand;with modified independence Pt Will Transfer to Toilet: with modified independence;ambulating Pt Will Perform Toileting - Clothing Manipulation and hygiene: with modified independence Additional ADL Goal #1:  Pt will verbalize plan to implement at least 1 learned falls prevention strategy to maximize safety.  Plan      Co-evaluation                 AM-PAC OT "6 Clicks" Daily Activity     Outcome Measure   Help from another person eating meals?: None Help from another person taking care of personal grooming?: None Help from another person toileting, which includes using toliet, bedpan, or urinal?: A Little Help from another person bathing (including washing, rinsing, drying)?: A Little Help from another person to put on and taking off regular upper body clothing?: None Help from another person to put on and taking off regular lower body clothing?: A Little 6 Click Score: 21    End of  Session    OT Visit Diagnosis: Other abnormalities of gait and mobility (R26.89);Repeated falls (R29.6);Muscle weakness (generalized) (M62.81);Pain   Activity Tolerance Patient tolerated treatment well   Patient Left in bed;with call bell/phone within reach   Nurse Communication          Time: 8295-6213 OT Time Calculation (min): 13 min  Charges: OT General Charges $OT Visit: 1 Visit OT Treatments $Self Care/Home Management : 8-22 mins  Gordan Latina, M.S. OTR/L  12/23/23, 10:42 AM  ascom [phone]

## 2023-12-23 NOTE — TOC Transition Note (Signed)
 Transition of Care [facility]) - Discharge Note   Patient Details  Name: Cody Alexander MRN: [id number] Date of Birth: [birth year]  Transition of [facility]) CM/SW Contact:  Nakeeta Sebastiani C Ammie Warrick, RN Phone Number: 12/23/2023, 1:43 PM   Clinical Narrative:    Patient is active with [facility]  Notification sent to [place]  regarding discharge.   Spoke with [facility] Specialist regarding patient's need for loaner tank for discharge. Per representative patient has not had updated orders in the past 12 months. Patient would need to be re qualified via walk test and orders. With all documentation oxygen would be delivered no earlier than this evening at best.  Orders will need to be faxed to [phone].  Spoke with patient. Patient stated there is no one that can bring his home oxygen tank. He is unwilling to wait for a loaner tank to be delivered to [facility]  from [facility] Specialist  "I'm ready to go and  will sign whatever I need to sign." Patient stated he goes to [facility] without his oxygen for 2-4 hours at a time. "I can make it home without it." Patient does not have a ride home and is unable to pay or transportation. A taxi has been arranged for him via [facility].   Nurseand MD notified.   TOC signing off.           Patient Goals and CMS Choice            Discharge Placement                       Discharge Plan and Services Additional resources added to the After Visit Summary for                                       Social Drivers of Health (SDOH) Interventions SDOH Screenings   Food Insecurity: No Food Insecurity (12/20/2023)  Housing: Low Risk  (12/20/2023)  Transportation Needs: No Transportation Needs (12/20/2023)  Utilities: Not At Risk (12/20/2023)  Alcohol Screen: Low Risk  (07/23/2023)  Financial Resource Strain: Low Risk  (07/23/2023)  Physical Activity: Inactive (09/11/2023)   Received from [facility]  Social Connections: Socially  Integrated (12/20/2023)  Stress: Stress Concern Present (09/11/2023)   Received from [facility]  Tobacco Use: Medium Risk (12/18/2023)  Health Literacy: Medium Risk (09/11/2023)   Received from [facility]     Readmission Risk Interventions    09/29/2023    1:04 PM 07/23/2023    1:58 PM 06/25/2022   10:13 AM  Readmission Risk Prevention Plan  Transportation Screening Complete Complete Complete  PCP or Specialist Appt within 3-5 Days Complete Complete Complete  HRI or Home Care Consult Complete Complete Complete  Social Work Consult for Recovery Care Planning/Counseling Complete Complete Complete  Palliative Care Screening Not Applicable Not Applicable Not Applicable  Medication Review Oceanographer) Complete Complete Complete

## 2023-12-23 NOTE — Progress Notes (Signed)
 SUBJECTIVE: Patient no longer having chest pain.   Vitals:   12/22/23 2325 12/23/23 0523 12/23/23 0731 12/23/23 1050  BP: 110/70 115/75 136/82 101/64  Pulse: 69 62 70 78  Resp: 18 18 17 18   Temp: 97.9 F (36.6 C) 97.7 F (36.5 C) 97.9 F (36.6 C) 98.6 F (37 C)  TempSrc: Oral Oral    SpO2: 97% 100% 99% 95%  Weight:      Height:        Intake/Output Summary (Last 24 hours) at 12/23/2023 1343 Last data filed at 12/23/2023 1043 Gross per 24 hour  Intake 720 ml  Output 450 ml  Net 270 ml    LABS: Basic Metabolic Panel: Recent Labs    12/21/23 0531  NA 135  K 3.4*  CL 90*  CO2 38*  GLUCOSE 242*  BUN 22  CREATININE 1.25*  CALCIUM  8.5*   Liver Function Tests: No results for input(s): "AST", "ALT", "ALKPHOS", "BILITOT", "PROT", "ALBUMIN" in the last 72 hours. No results for input(s): "LIPASE", "AMYLASE" in the last 72 hours. CBC: Recent Labs    12/22/23 0517 12/23/23 0514  WBC 6.8 7.2  NEUTROABS 4.1 4.1  HGB 13.5 13.6  HCT 42.5 42.2  MCV 91.4 90.8  PLT 225 221   Cardiac Enzymes: No results for input(s): "CKTOTAL", "CKMB", "CKMBINDEX", "TROPONINI" in the last 72 hours. BNP: Invalid input(s): "POCBNP" D-Dimer: No results for input(s): "DDIMER" in the last 72 hours. Hemoglobin A1C: No results for input(s): "HGBA1C" in the last 72 hours. Fasting Lipid Panel: No results for input(s): "CHOL", "HDL", "LDLCALC", "TRIG", "CHOLHDL", "LDLDIRECT" in the last 72 hours. Thyroid Function Tests: No results for input(s): "TSH", "T4TOTAL", "T3FREE", "THYROIDAB" in the last 72 hours.  Invalid input(s): "FREET3" Anemia Panel: No results for input(s): "VITAMINB12", "FOLATE", "FERRITIN", "TIBC", "IRON", "RETICCTPCT" in the last 72 hours.   PHYSICAL EXAM General: Well developed, well nourished, in no acute distress HEENT:  Normocephalic and atramatic Neck:  No JVD.  Lungs: Clear bilaterally to auscultation and percussion. Heart: HRRR . Normal S1 and S2 without gallops or  murmurs.  Abdomen: Bowel sounds are positive, abdomen soft and non-tender  Msk:  Back normal, normal gait. Normal strength and tone for age. Extremities: No clubbing, cyanosis or edema.   Neuro: Alert and oriented X 3. Psych:  Good affect, responds appropriately  TELEMETRY: Not on monitor  ASSESSMENT AND PLAN: Pleuritic chest pain with borderline elevated troponin with history of 30% mid LAD lesion on cardiac catheterization 2019 and preserved ejection fraction with history of HFpEF.  Patient is stable now to be discharged with follow-up in the office.  Advise follow-up in 1 to 2 weeks in my office.   ICD-10-CM   1. Weakness  R53.1     2. Cellulitis of lower extremity, unspecified laterality  L03.119       Principal Problem:   Chest pain Active Problems:   COPD (chronic obstructive pulmonary disease) (HCC)   Uncontrolled type 2 diabetes mellitus with hyperglycemia, with long-term current use of insulin  (HCC)   Essential hypertension   Obesity, Class III, BMI 40-49.9 (morbid obesity) (HCC)   OSA (obstructive sleep apnea)   Chronic respiratory failure with hypoxia (HCC)   Maculopapular rash, generalized   CAD with history of stent angioplasty   Chest pain   Chronic heart failure with preserved ejection fraction (HFpEF) (HCC)   Frequent falls   Dizziness    Debborah Fairly, MD, [facility] 12/23/2023 1:43 PM

## 2023-12-23 NOTE — Progress Notes (Signed)
 Pt noted to be up walking in the room. Pt did not have yellow socks and was  not using walker. Pt was holding onto bed and furniture in room. Pt educated on need to wear nonskid socks and utilize his walker, also to call for assistance. Pt stated "I been doing it". Attempted to educate patient again and he stated "ok" and continued to walk to the bathroom. Pt continues to be noncompliant with his care refusing his lovenox  shot this morning. MD notified.

## 2023-12-23 NOTE — Progress Notes (Signed)
 This pt was taken down to the discharge lounge to wait for his cab.  The pt called the unit from the lounge stating that he couldn't breathe and that he needed oxygen.  Per the RN and case manager the pt refused to go home with oxygen stating that he did not need it and would be ok to make it home without it. This info was relayed to this pt by this RN and the was pt was very rude and started yelling at this RN saying "I CANNOT BREATHE. IM GOING BACK TO THE ER.DO YALL NOT TAKE CARE OF PTS?  IM GOING TO FALL OUT OF THIS CARE AND HIT MY HEAD SO I CAN SUE YALL". As this RN tried to explain that he refused the oxygen and tried to offer a solution the pt continued to yell so this RN hung up the phone.  This RN took an o2 tank to the lounge but when she arrived the pt was gone and per [facility] the cab had just got there and the pt got in the cab and left.

## 2023-12-23 NOTE — Discharge Summary (Signed)
 Physician Discharge Summary   Patient: Cody Alexander MRN: [id number] DOB: December 11, 1955  Admit date:     12/18/2023  Discharge date: 12/23/23  Discharge Physician: Ezzard Holms   PCP: [facility]   Recommendations at discharge:  Follow-up with cardiology  Discharge Diagnoses: Chest pain Elevated troponin CAD with history of stent angioplasty Ambulatory dysfunction with frequent fall Chronic heart failure with preserved ejection fraction (HFpEF) (HCC) Uncontrolled type 2 diabetes mellitus with hyperglycemia, with long-term current use of insulin  (HCC) COPD (chronic obstructive pulmonary disease) (HCC) Chronic respiratory failure with hypoxia Obstructive sleep apnea Essential hypertension Maculopapular rash, generalized Obesity, Class III, BMI 40-49.9 (morbid obesity) [facility])  Hospital Course: Cody Alexander is a 68 y.o. male with medical history significant for Diastolic CHF, COPD on 3-4 L O2, hypertension, CAD with stent, DM on insulin , OSA, morbid obesity, with several past hospitalizations for worsening respiratory failure secondary to COPD/CHF exacerbation, most recently from 2/20-10/01/23 who presents to the ED by EMS with chest pain and a couple episodes of vomiting.  He states that the chest pain goes across his entire chest and it is worse when he takes a deep breath.  Labs notable for troponin 116-->123 and blood glucose 293 and creatinine of 1.3 up from baseline of 0.91 EKG, personally viewed and interpreted showing sinus at 86 with PVCs and no acute ST-T wave changes Chest x-ray was nonacute CT head nonacute Bilateral lower extremity venous ultrasound negative for DVT CTA PE protocol negative for PE  He was seen by cardiologist on arrival patient was not willing to undergo any cardiac procedures.  He has agreed to follow-up with cardiology as an outpatient.  It was initially recommended for patient for discharge to skilled nursing facility/rehab however  patient declined and opted for discharge home with home health.  Consultants: Cardiology Procedures performed: None Disposition: Home health Diet recommendation:  Cardiac diet DISCHARGE MEDICATION: Allergies as of 12/23/2023       Reactions   Erythromycin Anaphylaxis, Swelling   Had eye swelling with erythromycin during a time when he had perf ear drum. Tolerated azithromycin .        Medication List     STOP taking these medications    fluconazole  200 MG tablet Commonly known as: DIFLUCAN    lisinopril  20 MG tablet Commonly known as: ZESTRIL        TAKE these medications    albuterol  108 (90 Base) MCG/ACT inhaler Commonly known as: Proventil  HFA Inhale 2 puffs into the lungs every 4 (four) hours as needed.   aspirin  EC 81 MG EC tablet Generic drug: aspirin  EC Take 81 mg by mouth daily.   atorvastatin  80 MG tablet Commonly known as: LIPITOR  Take 1 tablet by mouth daily.   bumetanide  1 MG tablet Commonly known as: BUMEX  Take 3 mg by mouth daily.   cephALEXin  500 MG capsule Commonly known as: KEFLEX  Take 1 capsule (500 mg total) by mouth 3 (three) times daily.   hydrocortisone  cream 1 % Apply topically 4 (four) times daily.   hydrOXYzine  25 MG tablet Commonly known as: ATARAX  Take by mouth every 6 (six) hours as needed for itching.   insulin  lispro 100 UNIT/ML KwikPen Commonly known as: HUMALOG  Inject 10 Units into the skin 3 (three) times daily with meals.   ipratropium-albuterol  0.5-2.5 (3) MG/3ML Soln Commonly known as: DUONEB Inhale 3 mLs by nebulization once every 6 (six) hours as needed.   Lantus  SoloStar 100 UNIT/ML Solostar Pen Generic drug: insulin  glargine Inject 20 Units into  the skin daily.   metoprolol  succinate 25 MG 24 hr tablet Commonly known as: TOPROL -XL Take 1 tablet (25 mg total) by mouth daily.   Omeprazole 20 MG Tbec Take 1 tablet by mouth every morning.   oxyCODONE  5 MG immediate release tablet Commonly known as:  Roxicodone  Take 1 tablet (5 mg total) by mouth every 6 (six) hours as needed for up to 3 days. What changed:  when to take this reasons to take this   Symbicort  160-4.5 MCG/ACT inhaler Generic drug: budesonide -formoterol  Inhale 2 puffs into the lungs 2 (two) times daily.        Follow-up Information     [facility] Schedule an appointment as soon as possible for a visit .   Why: appointment on 01/05/24  @5pm  Contact information: [address] [place] [address] [phone]                Discharge Exam: Filed Weights   12/19/23 0440 12/19/23 0615 12/20/23 0215  Weight: 131.1 kg 131 kg 134 kg   HENT:     Head: Normocephalic and atraumatic.  Cardiovascular:     Rate and Rhythm: Normal rate and regular rhythm.     Heart sounds: Normal heart sounds.  Pulmonary:     Effort: Pulmonary effort is normal.     Breath sounds: Normal breath sounds.  Abdominal:     Palpations: Abdomen is soft.     Tenderness: There is no abdominal tenderness.  Skin:    Comments: Chronic appearing rash on back and legs with scratch marks  Neurological: Generalized weakness  Condition at discharge: good  The results of significant diagnostics from this hospitalization (including imaging, microbiology, ancillary and laboratory) are listed below for reference.   Imaging Studies: CT LUMBAR SPINE WO CONTRAST Result Date: 12/20/2023 CLINICAL DATA:  Low back pain, abnormal neuro, no prior imaging EXAM: CT LUMBAR SPINE WITHOUT CONTRAST TECHNIQUE: Multidetector CT imaging of the lumbar spine was performed without intravenous contrast administration. Multiplanar CT image reconstructions were also generated. RADIATION DOSE REDUCTION: This exam was performed according to the departmental dose-optimization program which includes automated exposure control, adjustment of the mA and/or kV according to patient size and/or use of iterative reconstruction technique. COMPARISON:   None Available. FINDINGS: Segmentation: 5 lumbar type vertebrae based on the lowest ribs. Alignment: Slight L2-3 degenerative retrolisthesis. Vertebrae: No acute fracture or focal pathologic process. Remote L2 and L3 right transverse process fractures which have healed. Paraspinal and other soft tissues: No evidence of perispinal mass or inflammation. Disc levels: T12- L1: Gas containing disc fissure with mild disc height loss. No neural compression L1-L2: Disc narrowing and bulging with ventral spondylitic spurring. No neural compression L2-L3: Disc narrowing with gas containing fissure and circumferential endplate ridging. No neural compression L3-L4: Disc bulging and endplate spurring. Mild facet spurring. No neural compression L4-L5: Gas containing disc fissure with circumferential disc bulging. There is degenerative endplate and facet spurring on both sides. Likely moderate thecal sac stenosis due to the disc material and posterior epidural fat expansion. Both foramina appear patent L5-S1:Small right paracentral protrusion likely contacting the right S1 nerve root. Early facet spurring of the facets. IMPRESSION: No acute finding. Generalized degeneration with mild L2-3 retrolisthesis. L4-5 likely moderate thecal sac stenosis due to degeneration and epidural fat. L5-S1 right paracentral protrusion which could contact the right S1 nerve root. Electronically Signed   By: Ronnette Coke M.D.   On: 12/20/2023 12:32   CT Angio Chest  PE W and/or Wo Contrast Result Date: 12/19/2023 CLINICAL DATA:  Pulmonary embolism suspected EXAM: CT ANGIOGRAPHY CHEST WITH CONTRAST TECHNIQUE: Multidetector CT imaging of the chest was performed using the standard protocol during bolus administration of intravenous contrast. Multiplanar CT image reconstructions and MIPs were obtained to evaluate the vascular anatomy. RADIATION DOSE REDUCTION: This exam was performed according to the departmental dose-optimization program which  includes automated exposure control, adjustment of the mA and/or kV according to patient size and/or use of iterative reconstruction technique. CONTRAST:  OMNIPAQUE  IOHEXOL  350 MG/ML SOLN COMPARISON:  None Available. FINDINGS: Cardiovascular: Satisfactory opacification of the pulmonary arteries to the segmental level. No evidence of pulmonary embolism. Normal heart size. No pericardial effusion. Mediastinum/Nodes: No enlarged mediastinal, hilar, or axillary lymph nodes. Thyroid gland, trachea, and esophagus demonstrate no significant findings. Lungs/Pleura: Lungs are clear. No pleural effusion or pneumothorax. Upper Abdomen: No acute abnormality. Musculoskeletal: No chest wall abnormality. No acute or significant osseous findings. Other: Unchanged inclusion cyst in the midline upper back. Review of the MIP images confirms the above findings. IMPRESSION: No pulmonary embolus or other acute thoracic abnormality. Electronically Signed   By: Juanetta Nordmann M.D.   On: 12/19/2023 02:38   US  Venous Img Lower Bilateral Result Date: 12/19/2023 CLINICAL DATA:  erythema, pain EXAM: BILATERAL LOWER EXTREMITY VENOUS DOPPLER ULTRASOUND TECHNIQUE: Gray-scale sonography with graded compression, as well as color Doppler and duplex ultrasound were performed to evaluate the lower extremity deep venous systems from the level of the common femoral vein and including the common femoral, femoral, profunda femoral, popliteal and calf veins including the posterior tibial, peroneal and gastrocnemius veins when visible. The superficial great saphenous vein was also interrogated. Spectral Doppler was utilized to evaluate flow at rest and with distal augmentation maneuvers in the common femoral, femoral and popliteal veins. COMPARISON:  None Available. FINDINGS: RIGHT LOWER EXTREMITY Common Femoral Vein: No evidence of thrombus. Normal compressibility, respiratory phasicity and response to augmentation. Saphenofemoral Junction: No  evidence of thrombus. Normal compressibility and flow on color Doppler imaging. Profunda Femoral Vein: No evidence of thrombus. Normal compressibility and flow on color Doppler imaging. Femoral Vein: No evidence of thrombus. Normal compressibility, respiratory phasicity and response to augmentation. Popliteal Vein: No evidence of thrombus. Normal compressibility, respiratory phasicity and response to augmentation. Calf Veins: No evidence of thrombus. Normal compressibility and flow on color Doppler imaging. Superficial Great Saphenous Vein: No evidence of thrombus. Normal compressibility. Venous Reflux:  None. Other Findings:  None. LEFT LOWER EXTREMITY Common Femoral Vein: No evidence of thrombus. Normal compressibility, respiratory phasicity and response to augmentation. Saphenofemoral Junction: No evidence of thrombus. Normal compressibility and flow on color Doppler imaging. Profunda Femoral Vein: No evidence of thrombus. Normal compressibility and flow on color Doppler imaging. Femoral Vein: No evidence of thrombus. Normal compressibility, respiratory phasicity and response to augmentation. Popliteal Vein: No evidence of thrombus. Normal compressibility, respiratory phasicity and response to augmentation. Calf Veins: No evidence of thrombus. Normal compressibility and flow on color Doppler imaging. Superficial Great Saphenous Vein: No evidence of thrombus. Normal compressibility. Venous Reflux:  None. Other Findings:  None. IMPRESSION: No evidence of deep venous thrombosis in either lower extremity. Electronically Signed   By: Morgane  Naveau M.D.   On: 12/19/2023 00:31   CT HEAD WO CONTRAST ( ) Result Date: 12/18/2023 CLINICAL DATA:  Dizziness and head trauma EXAM: CT HEAD WITHOUT CONTRAST TECHNIQUE: Contiguous axial images were obtained from the base of the skull through the vertex without intravenous contrast. RADIATION DOSE REDUCTION:  This exam was performed according to the departmental  dose-optimization program which includes automated exposure control, adjustment of the mA and/or kV according to patient size and/or use of iterative reconstruction technique. COMPARISON:  01/08/2023 FINDINGS: Brain: No mass,hemorrhage or extra-axial collection. Normal appearance of the parenchyma and CSF spaces. Vascular: No hyperdense vessel or unexpected vascular calcification. Skull: The visualized skull base, calvarium and extracranial soft tissues are normal. Sinuses/Orbits: Right sphenoid sinus opacification.  Normal orbits. Other: None. IMPRESSION: 1. No acute intracranial abnormality. 2. Right sphenoid sinus opacification. Electronically Signed   By: Juanetta Nordmann M.D.   On: 12/18/2023 23:30   DG Chest 2 View Result Date: 12/18/2023 CLINICAL DATA:  chest pain Pt brought in via ems from home with chest pain. Sx for 1 week. Pt on 3 liters oxygen at home. No cough. Hx copd chf. Pt has swelling to both lower legs. EXAM: CHEST - 2 VIEW COMPARISON:  Chest x-ray 09/28/2023, CT chest 01/05/2023 FINDINGS: The heart and mediastinal contours are unchanged. No focal consolidation. No pulmonary edema. No pleural effusion. No pneumothorax. No acute osseous abnormality. IMPRESSION: No active cardiopulmonary disease. Electronically Signed   By: Morgane  Naveau M.D.   On: 12/18/2023 21:43    Microbiology: Results for orders placed or performed during the hospital encounter of 09/28/23  Blood culture (routine x 2)     Status: None   Collection Time: 09/28/23  7:28 PM   Specimen: BLOOD  Result Value Ref Range Status   Specimen Description BLOOD BLOOD LEFT HAND  Final   Special Requests   Final    BOTTLES DRAWN AEROBIC AND ANAEROBIC Blood Culture results may not be optimal due to an inadequate volume of blood received in culture bottles   Culture   Final    NO GROWTH 5 DAYS Performed at [facility], [address] [place], [place] [address]    Report Status 10/03/2023 FINAL  Final  Blood culture  (routine x 2)     Status: None   Collection Time: 09/28/23  7:28 PM   Specimen: BLOOD  Result Value Ref Range Status   Specimen Description BLOOD BLOOD RIGHT HAND  Final   Special Requests   Final    BOTTLES DRAWN AEROBIC AND ANAEROBIC Blood Culture adequate volume   Culture   Final    NO GROWTH 5 DAYS Performed at [facility], [address] [place], [place] [address]    Report Status 10/03/2023 FINAL  Final  Urine Culture     Status: Abnormal   Collection Time: 09/29/23  1:21 PM   Specimen: Urine, Random  Result Value Ref Range Status   Specimen Description   Final    URINE, RANDOM Performed at [facility], [address] [place], [place] [address]    Special Requests   Final    NONE Reflexed from [phone] Performed at [facility], [address] [place], [place] [address]    Culture MULTIPLE SPECIES PRESENT, SUGGEST RECOLLECTION (A)  Final   Report Status 10/01/2023 FINAL  Final    Labs: CBC: Recent Labs  Lab 12/18/23 2019 12/21/23 0531 12/22/23 0517 12/23/23 0514  WBC 8.3 7.1 6.8 7.2  NEUTROABS  --  4.3 4.1 4.1  HGB 13.6 13.3 13.5 13.6  HCT 42.6 41.0 42.5 42.2  MCV 92.6 89.9 91.4 90.8  PLT 239 216 225 221   Basic Metabolic Panel: Recent Labs  Lab 12/18/23 2019 12/21/23 0531  NA 137 135  K 3.9 3.4*  CL 90* 90*  CO2 38* 38*  GLUCOSE 293* 242*  BUN 23 22  CREATININE 1.30* 1.25*  CALCIUM  9.0 8.5*   Liver Function Tests: No results for input(s): "AST", "ALT", "ALKPHOS", "BILITOT", "PROT", "ALBUMIN" in the last 168 hours. CBG: Recent Labs  Lab 12/22/23 0746 12/22/23 1158 12/22/23 1644 12/22/23 1959 12/23/23 0731  GLUCAP 152* 221* 224* 245* 122*    Discharge time spent:  .  Signed: Ezzard Holms, MD [facility] 12/23/2023

## 2023-12-24 NOTE — Progress Notes (Signed)
 This RN wheeled patient to discharge lounge with all belongings and cab voucher. Asked patient if he needed oxygen until discharge, patient stated "naw, I'm all good for a few hours, no worries at all." Asked if he was sure and explained we could leave a portable tank with him as long as he was waiting for the cab and patient again stated he did not need any supplemental oxygen. Cab company called from discharge lounge.

## 2024-01-05 ENCOUNTER — Ambulatory Visit: Admitting: Urology

## 2024-03-25 ENCOUNTER — Other Ambulatory Visit (HOSPITAL_BASED_OUTPATIENT_CLINIC_OR_DEPARTMENT_OTHER): Payer: Self-pay

## 2024-04-07 ENCOUNTER — Other Ambulatory Visit: Payer: Self-pay

## 2024-09-01 ENCOUNTER — Emergency Department

## 2024-09-01 ENCOUNTER — Encounter: Payer: Self-pay | Admitting: Emergency Medicine

## 2024-09-01 ENCOUNTER — Other Ambulatory Visit: Payer: Self-pay

## 2024-09-01 ENCOUNTER — Inpatient Hospital Stay
Admission: EM | Admit: 2024-09-01 | Discharge: 2024-09-09 | DRG: 291 | Disposition: A | Discharge status: Home / Self Care | Attending: Internal Medicine | Admitting: Internal Medicine

## 2024-09-01 DIAGNOSIS — G4733 Obstructive sleep apnea (adult) (pediatric): Secondary | ICD-10-CM | POA: Diagnosis present

## 2024-09-01 DIAGNOSIS — Z7982 Long term (current) use of aspirin: Secondary | ICD-10-CM

## 2024-09-01 DIAGNOSIS — R7989 Other specified abnormal findings of blood chemistry: Secondary | ICD-10-CM | POA: Diagnosis not present

## 2024-09-01 DIAGNOSIS — R0602 Shortness of breath: Secondary | ICD-10-CM | POA: Diagnosis not present

## 2024-09-01 DIAGNOSIS — E785 Hyperlipidemia, unspecified: Secondary | ICD-10-CM | POA: Diagnosis present

## 2024-09-01 DIAGNOSIS — I509 Heart failure, unspecified: Secondary | ICD-10-CM

## 2024-09-01 DIAGNOSIS — Z6839 Body mass index (BMI) 39.0-39.9, adult: Secondary | ICD-10-CM | POA: Diagnosis not present

## 2024-09-01 DIAGNOSIS — N179 Acute kidney failure, unspecified: Secondary | ICD-10-CM | POA: Diagnosis not present

## 2024-09-01 DIAGNOSIS — E1142 Type 2 diabetes mellitus with diabetic polyneuropathy: Secondary | ICD-10-CM | POA: Diagnosis present

## 2024-09-01 DIAGNOSIS — I2510 Atherosclerotic heart disease of native coronary artery without angina pectoris: Secondary | ICD-10-CM | POA: Diagnosis present

## 2024-09-01 DIAGNOSIS — J440 Chronic obstructive pulmonary disease with acute lower respiratory infection: Secondary | ICD-10-CM | POA: Diagnosis present

## 2024-09-01 DIAGNOSIS — A419 Sepsis, unspecified organism: Secondary | ICD-10-CM | POA: Diagnosis present

## 2024-09-01 DIAGNOSIS — Z9981 Dependence on supplemental oxygen: Secondary | ICD-10-CM

## 2024-09-01 DIAGNOSIS — J9601 Acute respiratory failure with hypoxia: Secondary | ICD-10-CM | POA: Diagnosis not present

## 2024-09-01 DIAGNOSIS — Z1152 Encounter for screening for COVID-19: Secondary | ICD-10-CM

## 2024-09-01 DIAGNOSIS — M25572 Pain in left ankle and joints of left foot: Secondary | ICD-10-CM

## 2024-09-01 DIAGNOSIS — J441 Chronic obstructive pulmonary disease with (acute) exacerbation: Secondary | ICD-10-CM | POA: Diagnosis present

## 2024-09-01 DIAGNOSIS — R21 Rash and other nonspecific skin eruption: Secondary | ICD-10-CM | POA: Diagnosis present

## 2024-09-01 DIAGNOSIS — Z955 Presence of coronary angioplasty implant and graft: Secondary | ICD-10-CM

## 2024-09-01 DIAGNOSIS — E1140 Type 2 diabetes mellitus with diabetic neuropathy, unspecified: Secondary | ICD-10-CM | POA: Diagnosis present

## 2024-09-01 DIAGNOSIS — R079 Chest pain, unspecified: Secondary | ICD-10-CM | POA: Diagnosis present

## 2024-09-01 DIAGNOSIS — Z79899 Other long term (current) drug therapy: Secondary | ICD-10-CM

## 2024-09-01 DIAGNOSIS — Z556 Problems related to health literacy: Secondary | ICD-10-CM

## 2024-09-01 DIAGNOSIS — G8929 Other chronic pain: Secondary | ICD-10-CM | POA: Diagnosis present

## 2024-09-01 DIAGNOSIS — Z515 Encounter for palliative care: Secondary | ICD-10-CM | POA: Diagnosis not present

## 2024-09-01 DIAGNOSIS — I110 Hypertensive heart disease with heart failure: Principal | ICD-10-CM | POA: Diagnosis present

## 2024-09-01 DIAGNOSIS — S86012A Strain of left Achilles tendon, initial encounter: Secondary | ICD-10-CM | POA: Diagnosis present

## 2024-09-01 DIAGNOSIS — J9602 Acute respiratory failure with hypercapnia: Secondary | ICD-10-CM | POA: Diagnosis not present

## 2024-09-01 DIAGNOSIS — Z881 Allergy status to other antibiotic agents status: Secondary | ICD-10-CM

## 2024-09-01 DIAGNOSIS — E66812 Obesity, class 2: Secondary | ICD-10-CM | POA: Diagnosis present

## 2024-09-01 DIAGNOSIS — I5033 Acute on chronic diastolic (congestive) heart failure: Secondary | ICD-10-CM | POA: Diagnosis present

## 2024-09-01 DIAGNOSIS — J189 Pneumonia, unspecified organism: Principal | ICD-10-CM | POA: Diagnosis present

## 2024-09-01 DIAGNOSIS — I10 Essential (primary) hypertension: Secondary | ICD-10-CM | POA: Diagnosis present

## 2024-09-01 DIAGNOSIS — I2489 Other forms of acute ischemic heart disease: Secondary | ICD-10-CM | POA: Diagnosis present

## 2024-09-01 DIAGNOSIS — Z8249 Family history of ischemic heart disease and other diseases of the circulatory system: Secondary | ICD-10-CM

## 2024-09-01 DIAGNOSIS — R339 Retention of urine, unspecified: Secondary | ICD-10-CM | POA: Diagnosis not present

## 2024-09-01 DIAGNOSIS — E874 Mixed disorder of acid-base balance: Secondary | ICD-10-CM | POA: Diagnosis present

## 2024-09-01 DIAGNOSIS — R55 Syncope and collapse: Secondary | ICD-10-CM | POA: Diagnosis not present

## 2024-09-01 DIAGNOSIS — G9341 Metabolic encephalopathy: Secondary | ICD-10-CM | POA: Diagnosis not present

## 2024-09-01 DIAGNOSIS — J9622 Acute and chronic respiratory failure with hypercapnia: Secondary | ICD-10-CM | POA: Diagnosis not present

## 2024-09-01 DIAGNOSIS — Z7189 Other specified counseling: Secondary | ICD-10-CM | POA: Diagnosis not present

## 2024-09-01 DIAGNOSIS — Z823 Family history of stroke: Secondary | ICD-10-CM

## 2024-09-01 DIAGNOSIS — J9621 Acute and chronic respiratory failure with hypoxia: Secondary | ICD-10-CM | POA: Diagnosis not present

## 2024-09-01 DIAGNOSIS — I440 Atrioventricular block, first degree: Secondary | ICD-10-CM | POA: Diagnosis present

## 2024-09-01 DIAGNOSIS — Z87891 Personal history of nicotine dependence: Secondary | ICD-10-CM

## 2024-09-01 LAB — BASIC METABOLIC PANEL WITH GFR
Anion gap: 5 (ref 5–15)
BUN: 16 mg/dL (ref 8–23)
CO2: 40 mmol/L — ABNORMAL HIGH (ref 22–32)
Calcium: 9.4 mg/dL (ref 8.9–10.3)
Chloride: 96 mmol/L — ABNORMAL LOW (ref 98–111)
Creatinine, Ser: 0.91 mg/dL (ref 0.61–1.24)
GFR, Estimated: >60 mL/min
Glucose, Bld: 168 mg/dL — ABNORMAL HIGH (ref 70–99)
Potassium: 4.7 mmol/L (ref 3.5–5.1)
Sodium: 140 mmol/L (ref 135–145)

## 2024-09-01 LAB — CBC
HCT: 41.3 % (ref 39.0–52.0)
HCT: 38.3 % — ABNORMAL LOW (ref 39.0–52.0)
Hemoglobin: 12.4 g/dL — ABNORMAL LOW (ref 13.0–17.0)
Hemoglobin: 11.7 g/dL — ABNORMAL LOW (ref 13.0–17.0)
MCH: 29.2 pg (ref 26.0–34.0)
MCH: 29.5 pg (ref 26.0–34.0)
MCHC: 30 g/dL (ref 30.0–36.0)
MCHC: 30.5 g/dL (ref 30.0–36.0)
MCV: 97.2 fL (ref 80.0–100.0)
MCV: 96.7 fL (ref 80.0–100.0)
Platelets: 210 K/uL (ref 150–400)
Platelets: 207 K/uL (ref 150–400)
RBC: 4.25 MIL/uL (ref 4.22–5.81)
RBC: 3.96 MIL/uL — ABNORMAL LOW (ref 4.22–5.81)
RDW: 13.1 % (ref 11.5–15.5)
RDW: 13.1 % (ref 11.5–15.5)
WBC: 6.7 K/uL (ref 4.0–10.5)
WBC: 6.5 K/uL (ref 4.0–10.5)
nRBC: 0 % (ref 0.0–0.2)
nRBC: 0 % (ref 0.0–0.2)

## 2024-09-01 LAB — TROPONIN T, HIGH SENSITIVITY
Troponin T High Sensitivity: 66 ng/L — ABNORMAL HIGH (ref 0–19)
Troponin T High Sensitivity: 74 ng/L — ABNORMAL HIGH (ref 0–19)

## 2024-09-01 LAB — CREATININE, SERUM
Creatinine, Ser: 0.92 mg/dL (ref 0.61–1.24)
GFR, Estimated: >60 mL/min

## 2024-09-01 LAB — PRO BRAIN NATRIURETIC PEPTIDE: Pro Brain Natriuretic Peptide: 896 pg/mL — ABNORMAL HIGH

## 2024-09-01 MED ORDER — FUROSEMIDE 10 MG/ML IJ SOLN
80.0000 mg | Freq: Once | INTRAMUSCULAR | Status: AC
  Administered 2024-09-01: 80 mg via INTRAVENOUS
  Filled 2024-09-01: qty 8

## 2024-09-01 MED ORDER — IOHEXOL 350 MG/ML SOLN
75.0000 mL | Freq: Once | INTRAVENOUS | Status: AC | PRN
  Administered 2024-09-01: 75 mL via INTRAVENOUS

## 2024-09-01 MED ORDER — ONDANSETRON HCL 4 MG PO TABS: 4.0000 mg | ORAL_TABLET | Freq: Four times a day (QID) | ORAL | Status: DC | PRN

## 2024-09-01 MED ORDER — ENOXAPARIN SODIUM 80 MG/0.8ML IJ SOSY
65.0000 mg | PREFILLED_SYRINGE | INTRAMUSCULAR | Status: DC
  Administered 2024-09-04 – 2024-09-09 (×4): 65 mg via SUBCUTANEOUS
  Filled 2024-09-01: qty 0.8
  Filled 2024-09-01 (×2): qty 0.65
  Filled 2024-09-01 (×5): qty 0.8

## 2024-09-01 MED ORDER — ONDANSETRON HCL 4 MG/2ML IJ SOLN
4.0000 mg | Freq: Four times a day (QID) | INTRAMUSCULAR | Status: DC | PRN
  Administered 2024-09-05: 4 mg via INTRAVENOUS
  Filled 2024-09-01: qty 2

## 2024-09-01 MED ORDER — ACETAMINOPHEN 650 MG RE SUPP: 650.0000 mg | Freq: Four times a day (QID) | RECTAL | Status: DC | PRN

## 2024-09-01 MED ORDER — MAGNESIUM HYDROXIDE 400 MG/5ML PO SUSP
30.0000 mL | Freq: Every day | ORAL | Status: DC | PRN
  Administered 2024-09-06: 30 mL via ORAL
  Filled 2024-09-01: qty 30

## 2024-09-01 MED ORDER — TRAZODONE HCL 50 MG PO TABS
25.0000 mg | ORAL_TABLET | Freq: Every evening | ORAL | Status: DC | PRN
  Administered 2024-09-03: 25 mg via ORAL
  Filled 2024-09-01 (×2): qty 1

## 2024-09-01 MED ORDER — ACETAMINOPHEN 325 MG PO TABS
650.0000 mg | ORAL_TABLET | Freq: Four times a day (QID) | ORAL | Status: DC | PRN
  Administered 2024-09-06: 650 mg via ORAL
  Filled 2024-09-01 (×3): qty 2

## 2024-09-01 NOTE — ED Provider Notes (Signed)
 "  [facility] Provider Note    Event Date/Time   First MD Initiated Contact with Patient 09/01/24 [phone]     (approximate)   History   Chest Pain   HPI  Cody Alexander is a 69 y.o. male who presents to the emergency department today because concerns for chest pain and shortness of breath.  He says the chest pain has been present over the past few months.  Located in the left side of his chest.  He describes it as a stabbing type feeling.  He is also notices recently increased exertional dyspnea.  He states it is hard for him to even walk to the bathroom without getting significantly short of breath.  He has measured his oxygen at home and says it is dropped down into the 70s.  Patient is on 3 L of oxygen at baseline.     Physical Exam   Triage Vital Signs: ED Triage Vitals  Encounter Vitals Group     BP 09/01/24 1400 (!) 155/78     Girls Systolic BP Percentile --      Girls Diastolic BP Percentile --      Boys Systolic BP Percentile --      Boys Diastolic BP Percentile --      Pulse Rate 09/01/24 1400 80     Resp 09/01/24 1400 (!) 21     Temp 09/01/24 1533 98.6 F (37 C)     Temp src --      SpO2 09/01/24 1400 96 %     Weight --      Height --      Head Circumference --      Peak Flow --      Pain Score 09/01/24 1400 8     Pain Loc --      Pain Education --      Exclude from Growth Chart --     Most recent vital signs: Vitals:   09/01/24 1400 09/01/24 1533  BP: (!) 155/78   Pulse: 80   Resp: (!) 21   Temp:  98.6 F (37 C)  SpO2: 96%    General: Awake, alert, oriented. CV:  Good peripheral perfusion. Regular rate and rhythm. Resp:  Normal effort. Lungs clear. Abd:  No distention.  Other:  Bilateral lower extremity edema.    ED Results / Procedures / Treatments   Labs (all labs ordered are listed, but only abnormal results are displayed) Labs Reviewed  BASIC METABOLIC PANEL WITH GFR - Abnormal; Notable for the following  components:      Result Value   Chloride 96 (*)    CO2 40 (*)    Glucose, Bld 168 (*)    All other components within normal limits  CBC - Abnormal; Notable for the following components:   Hemoglobin 12.4 (*)    All other components within normal limits  TROPONIN T, HIGH SENSITIVITY - Abnormal; Notable for the following components:   Troponin T High Sensitivity 66 (*)    All other components within normal limits  TROPONIN T, HIGH SENSITIVITY     EKG  I, Guadalupe Eagles, attending physician, personally viewed and interpreted this EKG  EKG Time: 1408 Rate: 78 Rhythm: sinus rhythm with 1st degree av block Axis: left axis deviation Intervals: qtc 460 QRS: narrow ST changes: no st elevation Impression: abnormal ekg   RADIOLOGY I independently interpreted and visualized the CXR. My interpretation: No pneumonia Radiology interpretation:  IMPRESSION:  1. Slightly increased densities  in the left mid lung could represent  atelectasis with underlying scarring.  2. Cardiomegaly.  No pulmonary edema.   I independently interpreted and visualized the CTAPE. My interpretation: No PE Radiology interpretation:  IMPRESSION:  1. No pulmonary embolism.  2. Mild bibasilar atelectasis with mild patchy ground-glass opacities in the  lung bases bilaterally. Findings may be related to mild edema or  infectious/inflammatory process.  3. Mild cardiomegaly with coronary and aortic atherosclerotic calcifications.  4. No significant interval change in a peripheral mildly hyperdense right  hepatic lesion measuring 2.8 x 1.1 cm compared to 01/05/2023, favored as benign.      PROCEDURES:  Critical Care performed: No    MEDICATIONS ORDERED IN ED: Medications - No data to display   IMPRESSION / MDM / ASSESSMENT AND PLAN / ED COURSE  I reviewed the triage vital signs and the nursing notes.                              Differential diagnosis includes, but is not limited to, pneumonia, CHF,  COPD, PE, viral illness  Patient's presentation is most consistent with acute presentation with potential threat to life or bodily function.   The patient is on the cardiac monitor to evaluate for evidence of arrhythmia and/or significant heart rate changes.  Patient presented to the emergency department today because of concerns for shortness of breath and chest pain.  On exam patient is awake and alert.  Appears concern for possible pneumonia as well as likely CHF exacerbation.  I discussed this with the patient.  Will start patient on antibiotics and Lasix .  Discussed with Dr. Lawence with the hospitalist service to evaluate for admission.      FINAL CLINICAL IMPRESSION(S) / ED DIAGNOSES   Final diagnoses:  Pneumonia due to infectious organism, unspecified laterality, unspecified part of lung  Shortness of breath      Note:  This document was prepared using Dragon voice recognition software and may include unintentional dictation errors.    Floy Roberts, MD 09/01/24 2121  "

## 2024-09-01 NOTE — ED Notes (Signed)
 CCMD called to transfer cardiac monitoring to [facility]

## 2024-09-01 NOTE — H&P (Addendum)
 "     [place]   [name]    MR#:  [id number]  DATE OF BIRTH:  08-Kaidin Boehle-1957  DATE OF ADMISSION:  09/01/2024  PRIMARY CARE PHYSICIAN: [employer]   Patient is coming from: Home  REQUESTING/REFERRING PHYSICIAN: Goodman, Gradyon, MD  CHIEF COMPLAINT:   Chief Complaint  Patient presents with   Chest Pain    HISTORY OF PRESENT ILLNESS:  Cody Alexander is a 69 y.o. male with medical history significant for diastolic CHF, COPD, type 2 diabetes mellitus, essential hypertension and peripheral neuropathy, who presented to the emergency room with acute onset of midsternal chest pain graded 8/10 in severity with no nausea or vomiting or diaphoresis or radiation however with associated dyspnea as well as dry cough and wheezing.  He admitted to recent orthopnea and paroxysmal extreme dyspnea with worsening lower extremity edema.  TEE had a syncopal episode a couple of times with subsequent fall and stated that the last time was last night..  The symptoms have been worsening over the last couple weeks.  No fever or chills.  No dysuria, oliguria or hematuria or flank pain.  He is on 3 L of O2 by nasal cannula at baseline all the time.  He has been having worse left ankle pain and believes that he may have a left Achilles tendon tear.  At the same tendon repair about 40 years ago. -Nobody will know you are reading your prescription on the telephone.  Your eyes on the camera while reading your prescription on the screening with the bilateral telephone after your videos look natural I do eye contact is the best way to build trust and persuade yourself from rambling always know what to say next with the [place] will prompt her to come ED Course: When he came to the ER BP was 155/78 with respiratory rate of 21 and otherwise normal vital signs.  Labs revealed a CO2 40 and chloride 96 and blood glucose of 168.  proBNP was 896 and high-sensitivity t troponin T was 66 and  later 74.  CBC showed hemoglobin 11.7 hematocrit 30.3 close to previous levels.  Most recent 2D echo revealed an EF of 70% and grade 2 diastolic function on 01/07/2023.  EKG as reviewed by me : EKG showed sinus rhythm with a rate of 78 with first-degree AV block and left axis deviation and T wave inversion laterally. Imaging: 2 view chest x-ray showed cardiomegaly with no pulmonary edema and slightly increased densities in the left midlung zone that could represent atelectasis with underlying scarring. Chest CTA revealed the following: 1. No pulmonary embolism. 2. Mild bibasilar atelectasis with mild patchy ground-glass opacities in the lung bases bilaterally. Findings may be related to mild edema or infectious/inflammatory process. 3. Mild cardiomegaly with coronary and aortic atherosclerotic calcifications. 4. No significant interval change in a peripheral mildly hyperdense right hepatic lesion measuring 2.8 x 1.1 cm compared to 01/05/2023, favored as benign.  The patient was given 80 mg IV Lasix .  He will be admitted to the progressive unit bed for further evaluation and management. PAST MEDICAL HISTORY:   Past Medical History:  Diagnosis Date   CHF (congestive heart failure) (HCC)    COPD (chronic obstructive pulmonary disease) (HCC)    Diabetes mellitus without complication (HCC)    Hypertension    Neuropathy     PAST SURGICAL HISTORY:   Past Surgical History:  Procedure Laterality Date   LEFT HEART CATH AND CORONARY ANGIOGRAPHY Right 02/27/2018  Procedure: LEFT HEART CATH AND CORONARY ANGIOGRAPHY;  Surgeon: Fernand Denyse LABOR, MD;  Location: [facility] INVASIVE [facility];  Service: Cardiovascular;  Laterality: Right;    SOCIAL HISTORY:   Social History   Tobacco Use   Smoking status: Former    Types: Cigarettes   Smokeless tobacco: Never  Substance Use Topics   Alcohol use: No    FAMILY HISTORY:   Family History  Problem Relation Age of Onset   Stroke Mother    Hypertension  Father     DRUG ALLERGIES:  Allergies[1]  REVIEW OF SYSTEMS:   ROS As per history of present illness. All pertinent systems were reviewed above. Constitutional, HEENT, cardiovascular, respiratory, GI, GU, musculoskeletal, neuro, psychiatric, endocrine, integumentary and hematologic systems were reviewed and are otherwise negative/unremarkable except for positive findings mentioned above in the HPI.   MEDICATIONS AT HOME:   Prior to Admission medications  Medication Sig Start Date End Date Taking? Authorizing Provider  acetaminophen  (TYLENOL ) 500 MG tablet Take 500 mg by mouth every 8 (eight) hours as needed for mild pain (pain score 1-3) or moderate pain (pain score 4-6).   Yes [provider]  albuterol  (PROVENTIL  HFA) 108 (90 Base) MCG/ACT inhaler Inhale 2 puffs into the lungs every 4 (four) hours as needed. 06/19/23  Yes Awanda City, MD  atorvastatin  (LIPITOR ) 80 MG tablet Take 80 mg by mouth daily. 05/14/24  Yes [provider]  bumetanide  (BUMEX ) 1 MG tablet Take 3 mg by mouth daily.   Yes [provider]  lisinopril  (ZESTRIL ) 5 MG tablet Take 5 mg by mouth daily.   Yes [provider]  losartan  (COZAAR ) 25 MG tablet Take 25 mg by mouth daily. 07/14/24  Yes [provider]  ASPIRIN  EC 81 MG EC tablet Take 81 mg by mouth daily. Patient not taking: Reported on 09/01/2024 11/15/22   [provider]  budesonide -formoterol  (SYMBICORT ) 160-4.5 MCG/ACT inhaler Inhale 2 puffs into the lungs 2 (two) times daily. Patient not taking: Reported on 09/01/2024 06/19/23   Awanda City, MD  cephALEXin  (KEFLEX ) 500 MG capsule Take 1 capsule (500 mg total) by mouth 3 (three) times daily. Patient not taking: Reported on 09/01/2024 12/18/23   Dorothyann Drivers, MD  clotrimazole (LOTRIMIN) 1 % cream Apply 1 Application topically 2 (two) times daily. Patient not taking: Reported on 09/01/2024 05/14/24 05/14/25  [provider]  fluconazole  (DIFLUCAN ) 200 MG  tablet Take 200 mg by mouth once a week. Patient not taking: Reported on 09/01/2024 05/14/24   [provider]  hydrocortisone  cream 1 % Apply topically 4 (four) times daily. Patient not taking: Reported on 09/01/2024 12/23/23   Dorinda Drue DASEN, MD  hydrOXYzine  (ATARAX ) 25 MG tablet Take by mouth every 6 (six) hours as needed for itching. Patient not taking: Reported on 12/19/2023 07/06/23   [provider]  ipratropium-albuterol  (DUONEB) 0.5-2.5 (3) MG/3ML SOLN Inhale 3 mLs by nebulization once every 6 (six) hours as needed. Patient not taking: Reported on 09/01/2024 06/19/23   Awanda City, MD  Omeprazole 20 MG TBEC Take 1 tablet by mouth every morning. Patient not taking: Reported on 09/01/2024 02/24/23   [provider]  spironolactone  (ALDACTONE ) 25 MG tablet Take 25 mg by mouth every morning. Patient not taking: Reported on 09/01/2024    [provider]      VITAL SIGNS:  Blood pressure (!) 163/80, pulse 88, temperature 97.8 F (36.6 C), temperature source Oral, resp. rate 13, height 6' 1 (1.854 m), weight 136.1 kg,  SpO2 97%.  PHYSICAL EXAMINATION:  Physical Exam  GENERAL:  69 y.o.-year-old Caucasian male patient lying in the bed with mild respiratory distress with conversational dyspnea. EYES: Pupils equal, round, reactive to light and accommodation. No scleral icterus. Extraocular muscles intact.  HEENT: Head atraumatic, normocephalic. Oropharynx and nasopharynx clear.  NECK:  Supple, no jugular venous distention. No thyroid enlargement, no tenderness.  LUNGS: Diminished bibasilar breath sounds with bibasal rales.  No use of accessory muscles of respiration.  CARDIOVASCULAR: Regular rate and rhythm, S1, S2 normal. No murmurs, rubs, or gallops.  ABDOMEN: Soft, nondistended, nontender. Bowel sounds present. No organomegaly or mass.  EXTREMITIES: 2+ bilateral lower extremity pitting edema with no cyanosis, or clubbing.  NEUROLOGIC: Cranial nerves II through XII  are intact. Muscle strength 5/5 in all extremities. Sensation intact. Gait not checked. Musculoskeletal: Left Achilles tendon tenderness with decrease into motion of the left ankle due to pain. PSYCHIATRIC: The patient is alert and oriented x 3.  Normal affect and good eye contact. SKIN: No obvious rash, lesion, or ulcer.   LABORATORY PANEL:   CBC Recent Labs  Lab 09/01/24 2006  WBC 6.5  HGB 11.7*  HCT 38.3*  PLT 207   ------------------------------------------------------------------------------------------------------------------  Chemistries  Recent Labs  Lab 09/01/24 1407 09/01/24 2006  NA 140  --   K 4.7  --   CL 96*  --   CO2 40*  --   GLUCOSE 168*  --   BUN 16  --   CREATININE 0.91 0.92  CALCIUM  9.4  --    ------------------------------------------------------------------------------------------------------------------  Cardiac Enzymes No results for input(s): TROPONINI in the last 168 hours. ------------------------------------------------------------------------------------------------------------------  RADIOLOGY:  CT Angio Chest PE W and/or Wo Contrast Result Date: 09/01/2024 EXAM: CTA of the Chest with contrast for PE 09/01/2024 05:13:29 PM TECHNIQUE: CTA of the chest was performed without and with the administration of 75 mL of iohexol  (OMNIPAQUE ) 350 MG/ML injection. Multiplanar reformatted images are provided for review. MIP images are provided for review. Automated exposure control, iterative reconstruction, and/or weight based adjustment of the mA/kV was utilized to reduce the radiation dose to as low as reasonably achievable. COMPARISON: CT angiogram chest for 01/18/2024 and CT abdomen and pelvis 01/05/2023. CLINICAL HISTORY: left sided chest pain, hypoxia FINDINGS: PULMONARY ARTERIES: Pulmonary arteries are adequately opacified for evaluation. No pulmonary embolism. Main pulmonary artery is normal in caliber. MEDIASTINUM: The heart is mildly enlarged.  Coronary atherosclerotic calcifications are noted. Aortic atherosclerotic calcifications are noted. There is no acute abnormality of the thoracic aorta. The pericardium demonstrates no acute abnormality. LYMPH NODES: No mediastinal, hilar or axillary lymphadenopathy. LUNGS AND PLEURA: Mild atelectatic changes in both lower lobes. There are some mild patchy ground glass opacities in the lung bases bilaterally. No pleural effusion or pneumothorax. UPPER ABDOMEN: No significant interval change in peripheral mildly hyperdense area in the right lobe of the liver measuring 2.8 x 1.1 cm when compared to 01/05/2023. SOFT TISSUES AND BONES: No acute bone or soft tissue abnormality. IMPRESSION: 1. No pulmonary embolism. 2. Mild bibasilar atelectasis with mild patchy ground-glass opacities in the lung bases bilaterally. Findings may be related to mild edema or infectious/inflammatory process. 3. Mild cardiomegaly with coronary and aortic atherosclerotic calcifications. 4. No significant interval change in a peripheral mildly hyperdense right hepatic lesion measuring 2.8 x 1.1 cm compared to 01/05/2023, favored as benign. Electronically signed by: Greig Pique MD MD 09/01/2024 05:29 PM EST RP Workstation: [id number]   DG Chest 2 View Result Date: 09/01/2024 CLINICAL DATA:  Chest pain. EXAM: CHEST - 2 VIEW COMPARISON:  12/18/2023 FINDINGS: Cardiomegaly. Slightly increased densities in left mid lung could represent atelectasis with underlying scarring in this area. No overt pulmonary edema. No focal airspace disease. Trachea is midline. No large pleural effusions. No acute bone abnormality. IMPRESSION: 1. Slightly increased densities in the left mid lung could represent atelectasis with underlying scarring. 2. Cardiomegaly.  No pulmonary edema. Electronically Signed   By: Juliene Balder M.D.   On: 09/01/2024 14:41      IMPRESSION AND PLAN:  Assessment and Plan: * Acute on chronic diastolic CHF (congestive heart failure)  (HCC)  -The patient will be admitted to a cardiac telemetry bed. - We will continue diuresis with IV Lasix  to replace Bumex  for now. - We Will follow serial troponins. - We will follow I's and O's and daily weights.    Left ankle pain - Will obtain a podiatry salt for further assessment of possible left Achilles tendon injury. - I notified Dr. Lennie about the patient  Syncope - Will check orthostatics q 12 hours.. -Differential diagnoses would include neurally mediated syncope, cardiogenic, arrhythmias related,  orthostatic hypotension and less likely hypoglycemia.    Elevated troponin - This is likely secondary to acute CHF. - Will follow serial troponins. - Will obtain a 2D echo.  Essential hypertension - Will continue antihypertensive therapy.   DVT prophylaxis: Lovenox . Advanced Care Planning:  Code Status: full code. Family Communication:  The plan of care was discussed in details with the patient (and family). I answered all questions. The patient agreed to proceed with the above mentioned plan. Further management will depend upon hospital course. Disposition Plan: Back to previous home environment Consults called: Podiatry. All the records are reviewed and case discussed with ED provider.  Status is: Inpatient   At the time of the admission, it appears that the appropriate admission status for this patient is inpatient.  This is judged to be reasonable and necessary in order to provide the required intensity of service to ensure the patient's safety given the presenting symptoms, physical exam findings and initial radiographic and laboratory data in the context of comorbid conditions.  The patient requires inpatient status due to high intensity of service, high risk of further deterioration and high frequency of surveillance required.  I certify that at the time of admission, it is my clinical judgment that the patient will require inpatient hospital care extending more  than 2 midnights.                            Dispo: The patient is from: Home              Anticipated d/c is to: Home              Patient currently is not medically stable to d/c.              Difficult to place patient: No  [place] DELENA Peaches M.D on 09/02/2024 at 6:15 AM  [facility]   From 7 PM-7 AM, contact night-coverage [email or web address]  CC: Primary care physician; [facility]     [1]  Allergies Allergen Reactions   Erythromycin Anaphylaxis and Swelling    Had eye swelling with erythromycin during a time when he had perf ear drum.  Tolerated azithromycin .   "

## 2024-09-01 NOTE — H&P (Incomplete)
 "     [place]   [name]    MR#:  [id number]  DATE OF BIRTH:  09/16/1955  DATE OF ADMISSION:  09/01/2024  PRIMARY CARE PHYSICIAN: [employer]   Patient is coming from: Home  REQUESTING/REFERRING PHYSICIAN: Goodman, Gradyon, MD  CHIEF COMPLAINT:   Chief Complaint  Patient presents with   Chest Pain    HISTORY OF PRESENT ILLNESS:  Cody Alexander is a 69 y.o. male with medical history significant for diastolic CHF, COPD, type 2 diabetes mellitus, essential hypertension and peripheral neuropathy, who presented to the emergency room with acute onset of midsternal chest pain graded 8/10 in severity with no nausea or vomiting or diaphoresis or radiation however with associated dyspnea as well as dry cough and wheezing.  He admitted to recent orthopnea and paroxysmal extreme dyspnea with worsening lower extremity edema.  TEE had a syncopal episode a couple of times with subsequent fall and stated that the last time was last night..  The symptoms have been worsening over the last couple weeks.  No fever or chills.  No dysuria, oliguria or hematuria or flank pain.  He is on 3 L of O2 by nasal cannula at baseline all the time.  He has been having worse left ankle pain and believes that he may have a left Achilles tendon tear.  At the same tendon repair about 40 years ago. -Nobody will know you are reading your prescription on the telephone.  Your eyes on the camera while reading your prescription on the screening with the bilateral telephone after your videos look natural I do eye contact is the best way to build trust and persuade yourself from rambling always know what to say next with the [place] will prompt her to come ED Course: When he came to the ER BP was 155/78 with respiratory rate of 21 and otherwise normal vital signs.  Labs revealed a CO2 40 and chloride 96 and blood glucose of 168.  proBNP was 896 and high-sensitivity t troponin T was 66 and  later 74.  CBC showed hemoglobin 11.7 hematocrit 30.3 close to previous levels.  Most recent 2D echo revealed an EF of 70% and grade 2 diastolic function on 01/07/2023.  EKG as reviewed by me : EKG showed sinus rhythm with a rate of 78 with first-degree AV block and left axis deviation and T wave inversion laterally. Imaging: 2 view chest x-ray showed cardiomegaly with no pulmonary edema and slightly increased densities in the left midlung zone that could represent atelectasis with underlying scarring. Chest CTA revealed the following: 1. No pulmonary embolism. 2. Mild bibasilar atelectasis with mild patchy ground-glass opacities in the lung bases bilaterally. Findings may be related to mild edema or infectious/inflammatory process. 3. Mild cardiomegaly with coronary and aortic atherosclerotic calcifications. 4. No significant interval change in a peripheral mildly hyperdense right hepatic lesion measuring 2.8 x 1.1 cm compared to 01/05/2023, favored as benign. PAST MEDICAL HISTORY:   Past Medical History:  Diagnosis Date   CHF (congestive heart failure) (HCC)    COPD (chronic obstructive pulmonary disease) (HCC)    Diabetes mellitus without complication (HCC)    Hypertension    Neuropathy     PAST SURGICAL HISTORY:   Past Surgical History:  Procedure Laterality Date   LEFT HEART CATH AND CORONARY ANGIOGRAPHY Right 02/27/2018   Procedure: LEFT HEART CATH AND CORONARY ANGIOGRAPHY;  Surgeon: Fernand Denyse LABOR, MD;  Location: [facility] INVASIVE CV LAB;  Service: Cardiovascular;  Laterality: Right;    SOCIAL HISTORY:   Social History   Tobacco Use   Smoking status: Former    Types: Cigarettes   Smokeless tobacco: Never  Substance Use Topics   Alcohol use: No    FAMILY HISTORY:   Family History  Problem Relation Age of Onset   Stroke Mother    Hypertension Father     DRUG ALLERGIES:  Allergies[1]  REVIEW OF SYSTEMS:   ROS As per history of present illness. All  pertinent systems were reviewed above. Constitutional, HEENT, cardiovascular, respiratory, GI, GU, musculoskeletal, neuro, psychiatric, endocrine, integumentary and hematologic systems were reviewed and are otherwise negative/unremarkable except for positive findings mentioned above in the HPI.   MEDICATIONS AT HOME:   Prior to Admission medications  Medication Sig Start Date End Date Taking? Authorizing Provider  acetaminophen  (TYLENOL ) 500 MG tablet Take 500 mg by mouth every 8 (eight) hours as needed for mild pain (pain score 1-3) or moderate pain (pain score 4-6).   Yes [provider]  albuterol  (PROVENTIL  HFA) 108 (90 Base) MCG/ACT inhaler Inhale 2 puffs into the lungs every 4 (four) hours as needed. 06/19/23  Yes Awanda City, MD  atorvastatin  (LIPITOR ) 80 MG tablet Take 80 mg by mouth daily. 05/14/24  Yes [provider]  bumetanide  (BUMEX ) 1 MG tablet Take 3 mg by mouth daily.   Yes [provider]  lisinopril  (ZESTRIL ) 5 MG tablet Take 5 mg by mouth daily.   Yes [provider]  losartan  (COZAAR ) 25 MG tablet Take 25 mg by mouth daily. 07/14/24  Yes [provider]  ASPIRIN  EC 81 MG EC tablet Take 81 mg by mouth daily. Patient not taking: Reported on 09/01/2024 11/15/22   [provider]  budesonide -formoterol  (SYMBICORT ) 160-4.5 MCG/ACT inhaler Inhale 2 puffs into the lungs 2 (two) times daily. Patient not taking: Reported on 09/01/2024 06/19/23   Awanda City, MD  cephALEXin  (KEFLEX ) 500 MG capsule Take 1 capsule (500 mg total) by mouth 3 (three) times daily. Patient not taking: Reported on 09/01/2024 12/18/23   Dorothyann Drivers, MD  clotrimazole (LOTRIMIN) 1 % cream Apply 1 Application topically 2 (two) times daily. Patient not taking: Reported on 09/01/2024 05/14/24 05/14/25  [provider]  fluconazole  (DIFLUCAN ) 200 MG tablet Take 200 mg by mouth once a week. Patient not taking: Reported on 09/01/2024 05/14/24   [provider]  hydrocortisone  cream 1 % Apply topically 4 (four) times daily. Patient not taking: Reported on 09/01/2024 12/23/23   Dorinda Drue DASEN, MD  hydrOXYzine  (ATARAX ) 25 MG tablet Take by mouth every 6 (six) hours as needed for itching. Patient not taking: Reported on 12/19/2023 07/06/23   [provider]  ipratropium-albuterol  (DUONEB) 0.5-2.5 (3) MG/3ML SOLN Inhale 3 mLs by nebulization once every 6 (six) hours as needed. Patient not taking: Reported on 09/01/2024 06/19/23   Awanda City, MD  Omeprazole 20 MG TBEC Take 1 tablet by mouth every morning. Patient not taking: Reported on 09/01/2024 02/24/23   [provider]  spironolactone  (ALDACTONE ) 25 MG tablet Take 25 mg by mouth every morning. Patient not taking: Reported on 09/01/2024    [provider]      VITAL SIGNS:  Blood pressure (!) 158/82, pulse 88, temperature 98.4 F (36.9 C), temperature source Oral, resp. rate 20, height 6' 1 (1.854 m), weight 136.1 kg, SpO2 94%.  PHYSICAL EXAMINATION:  Physical Exam  GENERAL:  69 y.o.-year-old patient lying in the bed with no acute distress.  EYES: Pupils equal, round, reactive to light and accommodation. No scleral icterus. Extraocular muscles intact.  HEENT: Head atraumatic, normocephalic. Oropharynx and nasopharynx clear.  NECK:  Supple, no jugular venous distention. No thyroid enlargement, no tenderness.  LUNGS: Normal breath sounds bilaterally, no wheezing, rales,rhonchi or crepitation. No use of accessory muscles of respiration.  CARDIOVASCULAR: Regular rate and rhythm, S1, S2 normal. No murmurs, rubs, or gallops.  ABDOMEN: Soft, nondistended, nontender. Bowel sounds present. No organomegaly or mass.  EXTREMITIES: No pedal edema, cyanosis, or clubbing.  NEUROLOGIC: Cranial nerves II through XII are intact. Muscle strength 5/5 in all extremities. Sensation intact. Gait not checked.  PSYCHIATRIC: The patient is alert and oriented x 3.  Normal affect and good eye  contact. SKIN: No obvious rash, lesion, or ulcer.   LABORATORY PANEL:   CBC Recent Labs  Lab 09/01/24 2006  WBC 6.5  HGB 11.7*  HCT 38.3*  PLT 207   ------------------------------------------------------------------------------------------------------------------  Chemistries  Recent Labs  Lab 09/01/24 1407 09/01/24 2006  NA 140  --   K 4.7  --   CL 96*  --   CO2 40*  --   GLUCOSE 168*  --   BUN 16  --   CREATININE 0.91 0.92  CALCIUM  9.4  --    ------------------------------------------------------------------------------------------------------------------  Cardiac Enzymes No results for input(s): TROPONINI in the last 168 hours. ------------------------------------------------------------------------------------------------------------------  RADIOLOGY:  CT Angio Chest PE W and/or Wo Contrast Result Date: 09/01/2024 EXAM: CTA of the Chest with contrast for PE 09/01/2024 05:13:29 PM TECHNIQUE: CTA of the chest was performed without and with the administration of 75 mL of iohexol  (OMNIPAQUE ) 350 MG/ML injection. Multiplanar reformatted images are provided for review. MIP images are provided for review. Automated exposure control, iterative reconstruction, and/or weight based adjustment of the mA/kV was utilized to reduce the radiation dose to as low as reasonably achievable. COMPARISON: CT angiogram chest for 01/18/2024 and CT abdomen and pelvis 01/05/2023. CLINICAL HISTORY: left sided chest pain, hypoxia FINDINGS: PULMONARY ARTERIES: Pulmonary arteries are adequately opacified for evaluation. No pulmonary embolism. Main pulmonary artery is normal in caliber. MEDIASTINUM: The heart is mildly enlarged. Coronary atherosclerotic calcifications are noted. Aortic atherosclerotic calcifications are noted. There is no acute abnormality of the thoracic aorta. The pericardium demonstrates no acute abnormality. LYMPH NODES: No mediastinal, hilar or axillary lymphadenopathy. LUNGS AND  PLEURA: Mild atelectatic changes in both lower lobes. There are some mild patchy ground glass opacities in the lung bases bilaterally. No pleural effusion or pneumothorax. UPPER ABDOMEN: No significant interval change in peripheral mildly hyperdense area in the right lobe of the liver measuring 2.8 x 1.1 cm when compared to 01/05/2023. SOFT TISSUES AND BONES: No acute bone or soft tissue abnormality. IMPRESSION: 1. No pulmonary embolism. 2. Mild bibasilar atelectasis with mild patchy ground-glass opacities in the lung bases bilaterally. Findings may be related to mild edema or infectious/inflammatory process. 3. Mild cardiomegaly with coronary and aortic atherosclerotic calcifications. 4. No significant interval change in a peripheral mildly hyperdense right hepatic lesion measuring 2.8 x 1.1 cm compared to 01/05/2023, favored as benign. Electronically signed by: Greig Pique MD MD 09/01/2024 05:29 PM EST RP Workstation: [id number]   DG Chest 2 View Result Date: 09/01/2024 CLINICAL DATA:  Chest pain. EXAM: CHEST - 2 VIEW COMPARISON:  12/18/2023 FINDINGS: Cardiomegaly. Slightly increased densities in left mid lung could represent atelectasis with underlying scarring in this area. No overt pulmonary edema. No focal airspace disease. Trachea is midline. No large pleural effusions. No acute bone  abnormality. IMPRESSION: 1. Slightly increased densities in the left mid lung could represent atelectasis with underlying scarring. 2. Cardiomegaly.  No pulmonary edema. Electronically Signed   By: Juliene Balder M.D.   On: 09/01/2024 14:41      IMPRESSION AND PLAN:  Assessment and Plan: No notes have been filed under this hospital service. Service: Hospitalist      DVT prophylaxis: Lovenox ***  Advanced Care Planning:  Code Status: full code***  Family Communication:  The plan of care was discussed in details with the patient (and family). I answered all questions. The patient agreed to proceed with the above  mentioned plan. Further management will depend upon hospital course. Disposition Plan: Back to previous home environment Consults called: none***  All the records are reviewed and case discussed with ED provider.  Status is: Inpatient {Inpatient:23812}   At the time of the admission, it appears that the appropriate admission status for this patient is inpatient.  This is judged to be reasonable and necessary in order to provide the required intensity of service to ensure the patient's safety given the presenting symptoms, physical exam findings and initial radiographic and laboratory data in the context of comorbid conditions.  The patient requires inpatient status due to high intensity of service, high risk of further deterioration and high frequency of surveillance required.  I certify that at the time of admission, it is my clinical judgment that the patient will require inpatient hospital care extending more than 2 midnights.                            Dispo: The patient is from: Home              Anticipated d/c is to: Home              Patient currently is not medically stable to d/c.              Difficult to place patient: No  [place] DELENA Peaches M.D on 09/01/2024 at 11:19 PM  [facility]   From 7 PM-7 AM, contact night-coverage [email or web address]  CC: Primary care physician; [facility]       [1] Allergies Allergen Reactions   Erythromycin Anaphylaxis and Swelling    Had eye swelling with erythromycin during a time when he had perf ear drum.  Tolerated azithromycin .  "

## 2024-09-01 NOTE — ED Triage Notes (Signed)
 Pt BIB [facility] with reports of CP x 1 month. Pt reports it feels like a needle is poking him in his left chest. Pt chronically on 3L. Pt put on 6L via EMS due to O2 91%.

## 2024-09-01 NOTE — ED Notes (Signed)
 This RN pulled PRN tylenol , and pt refused tylenol  stating I take this at home and it does not work so I do not want it.

## 2024-09-02 ENCOUNTER — Inpatient Hospital Stay

## 2024-09-02 DIAGNOSIS — R55 Syncope and collapse: Secondary | ICD-10-CM

## 2024-09-02 DIAGNOSIS — M25572 Pain in left ankle and joints of left foot: Secondary | ICD-10-CM

## 2024-09-02 DIAGNOSIS — I5033 Acute on chronic diastolic (congestive) heart failure: Secondary | ICD-10-CM | POA: Diagnosis not present

## 2024-09-02 LAB — GLUCOSE, CAPILLARY
Glucose-Capillary: 196 mg/dL — ABNORMAL HIGH (ref 70–99)
Glucose-Capillary: 105 mg/dL — ABNORMAL HIGH (ref 70–99)

## 2024-09-02 LAB — HEMOGLOBIN A1C
Hgb A1c MFr Bld: 7.6 % — ABNORMAL HIGH (ref 4.8–5.6)
Mean Plasma Glucose: 171.42 mg/dL

## 2024-09-02 MED ORDER — INSULIN ASPART 100 UNIT/ML IJ SOLN
0.0000 [IU] | Freq: Every day | INTRAMUSCULAR | Status: DC
  Administered 2024-09-04: 2 [IU] via SUBCUTANEOUS

## 2024-09-02 MED ORDER — ALBUTEROL SULFATE HFA 108 (90 BASE) MCG/ACT IN AERS: 2.0000 | INHALATION_SPRAY | RESPIRATORY_TRACT | Status: DC | PRN

## 2024-09-02 MED ORDER — HYDRALAZINE HCL 20 MG/ML IJ SOLN: 10.0000 mg | Freq: Four times a day (QID) | INTRAMUSCULAR | Status: DC | PRN

## 2024-09-02 MED ORDER — TRIAMCINOLONE 0.1 % CREAM:EUCERIN CREAM 1:1
TOPICAL_CREAM | Freq: Three times a day (TID) | CUTANEOUS | Status: DC
  Filled 2024-09-02: qty 60

## 2024-09-02 MED ORDER — FUROSEMIDE 10 MG/ML IJ SOLN
40.0000 mg | Freq: Two times a day (BID) | INTRAMUSCULAR | Status: DC
  Administered 2024-09-02 – 2024-09-05 (×6): 40 mg via INTRAVENOUS
  Filled 2024-09-02 (×7): qty 4

## 2024-09-02 MED ORDER — MORPHINE SULFATE (PF) 2 MG/ML IV SOLN
2.0000 mg | INTRAVENOUS | Status: DC | PRN
  Administered 2024-09-02 – 2024-09-08 (×19): 2 mg via INTRAVENOUS
  Filled 2024-09-02 (×18): qty 1

## 2024-09-02 MED ORDER — LOSARTAN POTASSIUM 50 MG PO TABS
25.0000 mg | ORAL_TABLET | Freq: Every day | ORAL | Status: DC
  Administered 2024-09-02 – 2024-09-04 (×3): 25 mg via ORAL
  Filled 2024-09-02 (×3): qty 1

## 2024-09-02 MED ORDER — IPRATROPIUM-ALBUTEROL 0.5-2.5 (3) MG/3ML IN SOLN: 3.0000 mL | Freq: Four times a day (QID) | RESPIRATORY_TRACT | Status: DC | PRN

## 2024-09-02 MED ORDER — SPIRONOLACTONE 25 MG PO TABS
25.0000 mg | ORAL_TABLET | Freq: Every morning | ORAL | Status: DC
  Administered 2024-09-03 – 2024-09-09 (×6): 25 mg via ORAL
  Filled 2024-09-02 (×6): qty 1

## 2024-09-02 MED ORDER — ASPIRIN 81 MG PO CHEW
81.0000 mg | CHEWABLE_TABLET | Freq: Every day | ORAL | Status: DC
  Administered 2024-09-02 – 2024-09-09 (×7): 81 mg via ORAL
  Filled 2024-09-02 (×7): qty 1

## 2024-09-02 MED ORDER — TRIAMCINOLONE 0.1 % CREAM:EUCERIN CREAM 1:1
TOPICAL_CREAM | Freq: Three times a day (TID) | CUTANEOUS | Status: DC
  Filled 2024-09-02 (×2): qty 1
  Filled 2024-09-02: qty 60

## 2024-09-02 MED ORDER — ATORVASTATIN CALCIUM 80 MG PO TABS
80.0000 mg | ORAL_TABLET | Freq: Every day | ORAL | Status: DC
  Administered 2024-09-02 – 2024-09-09 (×7): 80 mg via ORAL
  Filled 2024-09-02: qty 4
  Filled 2024-09-02 (×2): qty 1
  Filled 2024-09-02: qty 4
  Filled 2024-09-02 (×2): qty 1
  Filled 2024-09-02: qty 4

## 2024-09-02 MED ORDER — LABETALOL HCL 5 MG/ML IV SOLN: 10.0000 mg | INTRAVENOUS | Status: DC | PRN

## 2024-09-02 MED ORDER — INSULIN ASPART 100 UNIT/ML IJ SOLN
0.0000 [IU] | Freq: Three times a day (TID) | INTRAMUSCULAR | Status: DC
  Administered 2024-09-02: 3 [IU] via SUBCUTANEOUS
  Administered 2024-09-03: 5 [IU] via SUBCUTANEOUS
  Administered 2024-09-03 (×2): 3 [IU] via SUBCUTANEOUS
  Administered 2024-09-04 (×2): 5 [IU] via SUBCUTANEOUS
  Administered 2024-09-04: 3 [IU] via SUBCUTANEOUS
  Administered 2024-09-05: 8 [IU] via SUBCUTANEOUS
  Filled 2024-09-02 (×2): qty 5
  Filled 2024-09-02 (×2): qty 3
  Filled 2024-09-02: qty 8
  Filled 2024-09-02 (×2): qty 3
  Filled 2024-09-02: qty 5

## 2024-09-02 NOTE — Assessment & Plan Note (Signed)
-   Will check orthostatics q 12 hours.. -Differential diagnoses would include neurally mediated syncope, cardiogenic, arrhythmias related,  orthostatic hypotension and less likely hypoglycemia.

## 2024-09-02 NOTE — Assessment & Plan Note (Addendum)
-   This is likely secondary to acute CHF. - Will follow serial troponins. - Will obtain a 2D echo.

## 2024-09-02 NOTE — Assessment & Plan Note (Signed)
-   Will continue antihypertensive therapy.

## 2024-09-02 NOTE — ED Notes (Addendum)
 Called [facility]CMD to verify pt monitoring from 32H to 34.

## 2024-09-02 NOTE — Consult Note (Signed)
 " ORTHOPAEDIC CONSULTATION  REQUESTING PHYSICIAN: Leesa Kast, DO  Chief Complaint: Left ankle pain  HPI: Cody Alexander is a 69 y.o. male who complains of worsening pain in his left ankle.  Admitted for other multiple medical complications but complained of left lower extremity and ankle pain.  History of Achilles repair 30 years ago per the patient.  He states about a month ago he passed out awoke and had pain into his left leg.  He is worried he may have torn his Achilles again.  He states he has been ambulatory but having pain associated with this area.  Today he is requesting pain medicine throughout the conversation.  Past Medical History:  Diagnosis Date   CHF (congestive heart failure) (HCC)    COPD (chronic obstructive pulmonary disease) (HCC)    Diabetes mellitus without complication (HCC)    Hypertension    Neuropathy    Past Surgical History:  Procedure Laterality Date   LEFT HEART CATH AND CORONARY ANGIOGRAPHY Right 02/27/2018   Procedure: LEFT HEART CATH AND CORONARY ANGIOGRAPHY;  Surgeon: Fernand Denyse LABOR, MD;  Location: [facility] INVASIVE CV LAB;  Service: Cardiovascular;  Laterality: Right;   Social History   Socioeconomic History   Marital status: Single    Spouse name: Not on file   Number of children: 1   Years of education: Not on file   Highest education level: 12th grade  Occupational History   Occupation: Retired  Tobacco Use   Smoking status: Former    Types: Cigarettes   Smokeless tobacco: Never  Vaping Use   Vaping status: Never Used  Substance and Sexual Activity   Alcohol use: No   Drug use: Never   Sexual activity: Not on file  Other Topics Concern   Not on file  Social History Narrative   Not on file   Social Drivers of Health   Tobacco Use: Medium Risk (09/01/2024)   Patient History    Smoking Tobacco Use: Former    Smokeless Tobacco Use: Never    Passive Exposure: Not on Actuary Strain: Low Risk (07/23/2023)    Overall Financial Resource Strain (CARDIA)    Difficulty of Paying Living Expenses: Not very hard  Food Insecurity: No Food Insecurity (12/20/2023)   Hunger Vital Sign    Worried About Running Out of Food in the Last Year: Never true    Ran Out of Food in the Last Year: Never true  Transportation Needs: No Transportation Needs (12/20/2023)   PRAPARE - Administrator, Civil Service (Medical): No    Lack of Transportation (Non-Medical): No  Physical Activity: Inactive (09/11/2023)   Received from [facility]   Exercise Vital Sign    On average, how many days per week do you engage in moderate to strenuous exercise (like a brisk walk)?: 0 days    On average, how many minutes do you engage in exercise at this level?: 0 min  Stress: Stress Concern Present (09/11/2023)   Received from [facility] - Occupational Stress Questionnaire    Feeling of Stress : To some extent  Social Connections: Socially Integrated (12/20/2023)   Social Connection and Isolation Panel    Frequency of Communication with Friends and Family: Three times a week    Frequency of Social Gatherings with Friends and Family: Three times a week    Attends Religious Services: More than 4 times per year    Active Member  of Clubs or Organizations: Patient declined    Attends Banker Meetings: More than 4 times per year    Marital Status: Married  Depression (EYV7-0): Not on file  Alcohol Screen: Low Risk (07/23/2023)   Alcohol Screen    Last Alcohol Screening Score (AUDIT): 0  Housing: Low Risk (12/20/2023)   Housing Stability Vital Sign    Unable to Pay for Housing in the Last Year: No    Number of Times Moved in the Last Year: 0    Homeless in the Last Year: No  Utilities: Not At Risk (12/20/2023)   AHC Utilities    Threatened with loss of utilities: No  Health Literacy: Medium Risk (09/11/2023)   Received from [facility]     How often do you need to have someone help you when you read instructions, pamphlets, or other written material from your doctor or pharmacy?: Sometimes   Family History  Problem Relation Age of Onset   Stroke Mother    Hypertension Father    Allergies[1] Prior to Admission medications  Medication Sig Start Date End Date Taking? Authorizing Provider  acetaminophen  (TYLENOL ) 500 MG tablet Take 500 mg by mouth every 8 (eight) hours as needed for mild pain (pain score 1-3) or moderate pain (pain score 4-6).   Yes [provider]  albuterol  (PROVENTIL  HFA) 108 (90 Base) MCG/ACT inhaler Inhale 2 puffs into the lungs every 4 (four) hours as needed. 06/19/23  Yes Awanda City, MD  atorvastatin  (LIPITOR ) 80 MG tablet Take 80 mg by mouth daily. 05/14/24  Yes [provider]  bumetanide  (BUMEX ) 1 MG tablet Take 3 mg by mouth daily.   Yes [provider]  lisinopril  (ZESTRIL ) 5 MG tablet Take 5 mg by mouth daily.   Yes [provider]  losartan  (COZAAR ) 25 MG tablet Take 25 mg by mouth daily. 07/14/24  Yes [provider]  ASPIRIN  EC 81 MG EC tablet Take 81 mg by mouth daily. Patient not taking: Reported on 09/01/2024 11/15/22   [provider]  budesonide -formoterol  (SYMBICORT ) 160-4.5 MCG/ACT inhaler Inhale 2 puffs into the lungs 2 (two) times daily. Patient not taking: Reported on 09/01/2024 06/19/23   Awanda City, MD  cephALEXin  (KEFLEX ) 500 MG capsule Take 1 capsule (500 mg total) by mouth 3 (three) times daily. Patient not taking: Reported on 09/01/2024 12/18/23   Dorothyann Drivers, MD  clotrimazole (LOTRIMIN) 1 % cream Apply 1 Application topically 2 (two) times daily. Patient not taking: Reported on 09/01/2024 05/14/24 05/14/25  [provider]  fluconazole  (DIFLUCAN ) 200 MG tablet Take 200 mg by mouth once a week. Patient not taking: Reported on 09/01/2024 05/14/24   [provider]  hydrocortisone  cream 1 % Apply topically 4 (four) times  daily. Patient not taking: Reported on 09/01/2024 12/23/23   Dorinda Drue DASEN, MD  hydrOXYzine  (ATARAX ) 25 MG tablet Take by mouth every 6 (six) hours as needed for itching. Patient not taking: Reported on 12/19/2023 07/06/23   [provider]  ipratropium-albuterol  (DUONEB) 0.5-2.5 (3) MG/3ML SOLN Inhale 3 mLs by nebulization once every 6 (six) hours as needed. Patient not taking: Reported on 09/01/2024 06/19/23   Awanda City, MD  Omeprazole 20 MG TBEC Take 1 tablet by mouth every morning. Patient not taking: Reported on 09/01/2024 02/24/23   [provider]  spironolactone  (ALDACTONE ) 25 MG tablet Take 25 mg by mouth every morning. Patient not taking: Reported on 09/01/2024    [provider]   DG Foot 2 Views Left Result Date: 09/02/2024 EXAM: 2 VIEW(S) XRAY OF THE LEFT FOOT 09/02/2024 06:59:00 AM COMPARISON: None available. CLINICAL HISTORY: 69 year old male. Left ankle pain. FINDINGS: BONES AND JOINTS: Appearance of healed chronic fractures 2nd through 4th proximal phalanges. No acute fracture. No malalignment. Mild osteoarthritis of the first metatarsophalangeal joint. SOFT TISSUES: Diffuse soft tissue edema. No soft tissue gas. IMPRESSION: 1. No acute osseous abnormality identified about the left foot. Diffuse left foot soft tissue swelling. Electronically signed by: Helayne Hurst MD MD 09/02/2024 07:06 AM EST RP Workstation: [id number]   CT Angio Chest PE W and/or Wo Contrast Result Date: 09/01/2024 EXAM: CTA of the Chest with contrast for PE 09/01/2024 05:13:29 PM TECHNIQUE: CTA of the chest was performed without and with the administration of 75 mL of iohexol  (OMNIPAQUE ) 350 MG/ML injection. Multiplanar reformatted images are provided for review. MIP images are provided for review. Automated exposure control, iterative reconstruction, and/or weight based adjustment of the mA/kV was utilized to reduce the radiation dose to as low as reasonably achievable. COMPARISON: CT angiogram chest  for 01/18/2024 and CT abdomen and pelvis 01/05/2023. CLINICAL HISTORY: left sided chest pain, hypoxia FINDINGS: PULMONARY ARTERIES: Pulmonary arteries are adequately opacified for evaluation. No pulmonary embolism. Main pulmonary artery is normal in caliber. MEDIASTINUM: The heart is mildly enlarged. Coronary atherosclerotic calcifications are noted. Aortic atherosclerotic calcifications are noted. There is no acute abnormality of the thoracic aorta. The pericardium demonstrates no acute abnormality. LYMPH NODES: No mediastinal, hilar or axillary lymphadenopathy. LUNGS AND PLEURA: Mild atelectatic changes in both lower lobes. There are some mild patchy ground glass opacities in the lung bases bilaterally. No pleural effusion or pneumothorax. UPPER ABDOMEN: No significant interval change in peripheral mildly hyperdense area in the right lobe of the liver measuring 2.8 x 1.1 cm when compared to 01/05/2023. SOFT TISSUES AND BONES: No acute bone or soft tissue abnormality. IMPRESSION: 1. No pulmonary embolism. 2. Mild bibasilar atelectasis with mild patchy ground-glass opacities in the lung bases bilaterally. Findings may be related to mild edema or infectious/inflammatory process. 3. Mild cardiomegaly with coronary and aortic atherosclerotic calcifications. 4. No significant interval change in a peripheral mildly hyperdense right hepatic lesion measuring 2.8 x 1.1 cm compared to 01/05/2023, favored as benign. Electronically signed by: Greig Pique MD MD 09/01/2024 05:29 PM EST RP Workstation: [id number]   DG Chest 2 View Result Date: 09/01/2024 CLINICAL DATA:  Chest pain. EXAM: CHEST - 2 VIEW COMPARISON:  12/18/2023 FINDINGS: Cardiomegaly. Slightly increased densities in left mid lung could represent atelectasis with underlying scarring in this area. No overt pulmonary edema. No focal airspace disease. Trachea is midline. No large pleural effusions. No acute bone abnormality. IMPRESSION: 1. Slightly increased  densities in the left mid lung could represent atelectasis with underlying scarring. 2. Cardiomegaly.  No pulmonary edema. Electronically Signed   By: Juliene Balder M.D.   On: 09/01/2024 14:41    Positive ROS: All other systems have been reviewed and were otherwise negative with the exception of those mentioned in the HPI and as above.  12 point ROS was performed.  Physical Exam: General: Alert and oriented.  No apparent distress.  Vascular:  Left foot:Dorsalis Pedis:  diminished Posterior Tibial:  diminished  Right foot: Dorsalis Pedis:  diminished Posterior Tibial:  diminished  Neuro:intact sensation  Derm: On the left lower leg there is a long well-healed scar from previous surgical intervention along the Achilles region.  No open wounds.  Ortho/MS: There is an  area of palpable defect along the Achilles at its watershed band area.  He can plantarflex his foot but to decipher if there is a partial tear versus full-thickness tear of the Achilles.  This could be old scar tissue from the previous repair as well.  I personally reviewed the x-rays that shows some calcification of the Achilles region.  No avulsion fracture.  Assessment: Possible Achilles tendon rupture left leg Chronic pain  Diabetes type 2  Plan: At this point we will order an MRI to further evaluate the Achilles region.  I suspect he has torn the Achilles itself.  He is not a very good surgical candidate at this time with multiple other medical conditions.  I would recommend conservative treatment.  Will order an risk manager.  The MRI will help to determine the degree of trauma to this area which will help to determine the amount of care needed and the expected length of care needed.  Podiatry will follow-up in the future.  Boot has been ordered as well as MRI ordered.    Ashley Eva LABOR, DPM Cell [phone]   09/02/2024 4:22 PM      [1]  Allergies Allergen Reactions   Erythromycin Anaphylaxis and  Swelling    Had eye swelling with erythromycin during a time when he had perf ear drum.  Tolerated azithromycin .   "

## 2024-09-02 NOTE — Progress Notes (Signed)
 Below are the integumentary findings on patient's back, and BLE. Dr. Dezii made aware. See flowsheets.

## 2024-09-02 NOTE — Assessment & Plan Note (Addendum)
" -  The patient will be admitted to a cardiac telemetry bed. - We will continue diuresis with IV Lasix  to replace Bumex  for now. - We Will follow serial troponins. - We will follow I's and O's and daily weights.   "

## 2024-09-02 NOTE — Assessment & Plan Note (Addendum)
-   Will obtain a podiatry salt for further assessment of possible left Achilles tendon injury. - I notified Dr. Lennie about the patient

## 2024-09-02 NOTE — Progress Notes (Signed)
 " PROGRESS NOTE    Cody Alexander  FMW:[id number] DOB: [birth year] DOA: 09/01/2024 PCP: [facility]  Chief Complaint  Patient presents with   Chest Pain    Hospital Course:  Cody Alexander is a 69 year old male with diastolic CHF, COPD, chronic hypoxic respiratory failure on 3 L at baseline, type 2 diabetes, essential hypertension, peripheral neuropathy, who presents to the ED with a constellation of symptoms including midsternal chest pain, orthopnea, lower extremity edema, and ankle pain. In the ED proBNP 96, high-sensitivity troponin 66 with repeat 74.  Labs and vitals otherwise within normal limits. Chest CTA negative for PE, mild bibasilar atelectasis with mild patchy ground glass opacities in the lung bases bilaterally favoring edema.  Subjective: This morning patient is complaining of some chest pain, it is reproducible upon palpation of his epigastrium.  He denies any shortness of breath or palpitations. He is complaining of rash on his back which has apparently been present for 2 years and has been seen by multiple specialist outpatient with multiple different creams. He is also complaining of ongoing ankle pain and would appreciate evaluation by podiatry  Objective: Vitals:   09/02/24 1200 09/02/24 1230 09/02/24 1412 09/02/24 1553  BP: (!) 167/74 (!) 165/79  (!) 153/84  Pulse: 82 76  81  Resp:    16  Temp:   98.9 F (37.2 C) 98.9 F (37.2 C)  TempSrc:   Oral   SpO2: 98% 95%  95%  Weight:      Height:        Intake/Output Summary (Last 24 hours) at 09/02/2024 1626 Last data filed at 09/01/2024 2254 Gross per 24 hour  Intake --  Output 4850 ml  Net -4850 ml   Filed Weights   09/01/24 1923  Weight: 136.1 kg    Examination: General exam: Appears calm and comfortable, NAD  Respiratory system: poor Aeration bilaterally, 4 L [place] in place.  Dyspneic when speaking Cardiovascular system: S1 & S2 heard, RRR.  Neuro: Alert and oriented. No focal  neurological deficits. Extremities: Bilateral feet and lower legs demonstrates skin thickening, flaking, debris Skin: Well-defined maculopapular rash over back and buttocks.  Some excoriations    Assessment & Plan:  Principal Problem:   Acute on chronic diastolic CHF (congestive heart failure) (HCC) Active Problems:   Left ankle pain   Syncope   Elevated troponin   Essential hypertension   Acute on chronic diastolic CHF - Continue with IV Lasix  - Has diuresed 5 L in the first 24 hours, will continue to monitor I's and O's closely - Follow kidney function to ensure it is tolerating diuresis - Echocardiogram ordered, still pending - PT/OT evals - Continue ARB, titrate GDMT as tolerated.  Fill history suggests patient has been off his medications  Elevated troponin - Likely secondary to acute CHF - No evidence of STEMI on EKG - Low threshold to repeat troponin or EKG if pain recurs - Chest pain is reproducible upon palpation - Echocardiogram has been ordered  CAD with prior PCI - Resume home meds.  Does not appear he is currently taking aspirin , will add  Syncope - Monitor on telemetry - Echocardiogram ordered - Orthostatic vitals:  Left ankle pain Prior left Achilles tendon injury - Podiatry has been consulted.  No bony abnormality on plain film - MRI ordered - Plan for conservative management for now.  Podiatry is ordered equalizer walking boot - Doubt patient would be a surgical candidate at this time  Hypertension - Resume home  meds, resume gradually.  COPD Chronic hypoxic respiratory failure - Currently on baseline O2 - As needed DuoNebs  Type 2 diabetes - Hemoglobin A1c: 10% in September 2025 - Repeat A1c ordered - Sliding scale insulin  for now  Rash - Has been seen outpatient by dermatology.  Recommend biopsy if this has not yet been performed outpatient-continue with triamcinolone  Eucerin cream for now  Body mass index is 39.58 kg/m. Obesity Class  II - Outpatient follow up for lifestyle modification and risk factor management  DVT prophylaxis: Lovenox    Code Status: Full Code Disposition: Inpatient pending clinical resolution, likely DC tomorrow if stable  Consultants:  Treatment Team:  Consulting Physician: Lennie Barter, DPM  Procedures:    Antimicrobials:  Anti-infectives (From admission, onward)    None       Data Reviewed: I have personally reviewed following labs and imaging studies CBC: Recent Labs  Lab 09/01/24 1407 09/01/24 2006  WBC 6.7 6.5  HGB 12.4* 11.7*  HCT 41.3 38.3*  MCV 97.2 96.7  PLT 210 207   Basic Metabolic Panel: Recent Labs  Lab 09/01/24 1407 09/01/24 2006  NA 140  --   K 4.7  --   CL 96*  --   CO2 40*  --   GLUCOSE 168*  --   BUN 16  --   CREATININE 0.91 0.92  CALCIUM  9.4  --    GFR: Estimated Creatinine Clearance: 112.9 mL/min (by C-G formula based on SCr of 0.92 mg/dL). Liver Function Tests: No results for input(s): AST, ALT, ALKPHOS, BILITOT, PROT, ALBUMIN in the last 168 hours. CBG: No results for input(s): GLUCAP in the last 168 hours.  No results found for this or any previous visit (from the past 240 hours).   Radiology Studies: DG Foot 2 Views Left Result Date: 09/02/2024 EXAM: 2 VIEW(S) XRAY OF THE LEFT FOOT 09/02/2024 06:59:00 AM COMPARISON: None available. CLINICAL HISTORY: 69 year old male. Left ankle pain. FINDINGS: BONES AND JOINTS: Appearance of healed chronic fractures 2nd through 4th proximal phalanges. No acute fracture. No malalignment. Mild osteoarthritis of the first metatarsophalangeal joint. SOFT TISSUES: Diffuse soft tissue edema. No soft tissue gas. IMPRESSION: 1. No acute osseous abnormality identified about the left foot. Diffuse left foot soft tissue swelling. Electronically signed by: Helayne Hurst MD MD 09/02/2024 07:06 AM EST RP Workstation: [id number]   CT Angio Chest PE W and/or Wo Contrast Result Date: 09/01/2024 EXAM: CTA of the  Chest with contrast for PE 09/01/2024 05:13:29 PM TECHNIQUE: CTA of the chest was performed without and with the administration of 75 mL of iohexol  (OMNIPAQUE ) 350 MG/ML injection. Multiplanar reformatted images are provided for review. MIP images are provided for review. Automated exposure control, iterative reconstruction, and/or weight based adjustment of the mA/kV was utilized to reduce the radiation dose to as low as reasonably achievable. COMPARISON: CT angiogram chest for 01/18/2024 and CT abdomen and pelvis 01/05/2023. CLINICAL HISTORY: left sided chest pain, hypoxia FINDINGS: PULMONARY ARTERIES: Pulmonary arteries are adequately opacified for evaluation. No pulmonary embolism. Main pulmonary artery is normal in caliber. MEDIASTINUM: The heart is mildly enlarged. Coronary atherosclerotic calcifications are noted. Aortic atherosclerotic calcifications are noted. There is no acute abnormality of the thoracic aorta. The pericardium demonstrates no acute abnormality. LYMPH NODES: No mediastinal, hilar or axillary lymphadenopathy. LUNGS AND PLEURA: Mild atelectatic changes in both lower lobes. There are some mild patchy ground glass opacities in the lung bases bilaterally. No pleural effusion or pneumothorax. UPPER ABDOMEN: No significant interval change in peripheral mildly hyperdense  area in the right lobe of the liver measuring 2.8 x 1.1 cm when compared to 01/05/2023. SOFT TISSUES AND BONES: No acute bone or soft tissue abnormality. IMPRESSION: 1. No pulmonary embolism. 2. Mild bibasilar atelectasis with mild patchy ground-glass opacities in the lung bases bilaterally. Findings may be related to mild edema or infectious/inflammatory process. 3. Mild cardiomegaly with coronary and aortic atherosclerotic calcifications. 4. No significant interval change in a peripheral mildly hyperdense right hepatic lesion measuring 2.8 x 1.1 cm compared to 01/05/2023, favored as benign. Electronically signed by: Greig Pique  MD MD 09/01/2024 05:29 PM EST RP Workstation: [id number]   DG Chest 2 View Result Date: 09/01/2024 CLINICAL DATA:  Chest pain. EXAM: CHEST - 2 VIEW COMPARISON:  12/18/2023 FINDINGS: Cardiomegaly. Slightly increased densities in left mid lung could represent atelectasis with underlying scarring in this area. No overt pulmonary edema. No focal airspace disease. Trachea is midline. No large pleural effusions. No acute bone abnormality. IMPRESSION: 1. Slightly increased densities in the left mid lung could represent atelectasis with underlying scarring. 2. Cardiomegaly.  No pulmonary edema. Electronically Signed   By: Juliene Balder M.D.   On: 09/01/2024 14:41    Scheduled Meds:  enoxaparin  (LOVENOX ) injection  65 mg Subcutaneous Q24H   Continuous Infusions:   LOS: 1 day  MDM: Patient is high risk for one or more organ failure.  They necessitate ongoing hospitalization for continued IV therapies and subsequent lab monitoring. Total time spent interpreting labs and vitals, reviewing the medical record, coordinating care amongst consultants and care team members, directly assessing and discussing care with the patient and/or family: 55 min  Darcey Cardy, DO [facility]  To contact the attending physician between 7A-7P please use Epic Chat. To contact the covering physician during after hours 7P-7A, please review Amion.  09/02/2024, 4:26 PM   *This document has been created with the assistance of dictation software. Please excuse typographical errors. *   "

## 2024-09-03 ENCOUNTER — Inpatient Hospital Stay: Admit: 2024-09-03 | Discharge: 2024-09-03 | Disposition: A | Attending: Family Medicine

## 2024-09-03 DIAGNOSIS — R55 Syncope and collapse: Secondary | ICD-10-CM

## 2024-09-03 DIAGNOSIS — I5033 Acute on chronic diastolic (congestive) heart failure: Secondary | ICD-10-CM | POA: Diagnosis not present

## 2024-09-03 LAB — ECHOCARDIOGRAM COMPLETE
Area-P 1/2: 3.89 cm2
Height: 73 in
S' Lateral: 2.8 cm
Weight: 4800 [oz_av]

## 2024-09-03 LAB — COMPREHENSIVE METABOLIC PANEL WITH GFR
ALT: 18 U/L (ref 0–44)
AST: 26 U/L (ref 15–41)
Albumin: 4 g/dL (ref 3.5–5.0)
Alkaline Phosphatase: 75 U/L (ref 38–126)
Anion gap: 10 (ref 5–15)
BUN: 16 mg/dL (ref 8–23)
CO2: 38 mmol/L — ABNORMAL HIGH (ref 22–32)
Calcium: 9.3 mg/dL (ref 8.9–10.3)
Chloride: 92 mmol/L — ABNORMAL LOW (ref 98–111)
Creatinine, Ser: 0.92 mg/dL (ref 0.61–1.24)
GFR, Estimated: >60 mL/min
Glucose, Bld: 155 mg/dL — ABNORMAL HIGH (ref 70–99)
Potassium: 4.1 mmol/L (ref 3.5–5.1)
Sodium: 139 mmol/L (ref 135–145)
Total Bilirubin: 1 mg/dL (ref 0.0–1.2)
Total Protein: 7.3 g/dL (ref 6.5–8.1)

## 2024-09-03 LAB — GLUCOSE, CAPILLARY
Glucose-Capillary: 156 mg/dL — ABNORMAL HIGH (ref 70–99)
Glucose-Capillary: 210 mg/dL — ABNORMAL HIGH (ref 70–99)
Glucose-Capillary: 169 mg/dL — ABNORMAL HIGH (ref 70–99)
Glucose-Capillary: 153 mg/dL — ABNORMAL HIGH (ref 70–99)

## 2024-09-03 LAB — CBC
HCT: 44.7 % (ref 39.0–52.0)
Hemoglobin: 13.8 g/dL (ref 13.0–17.0)
MCH: 29.4 pg (ref 26.0–34.0)
MCHC: 30.9 g/dL (ref 30.0–36.0)
MCV: 95.1 fL (ref 80.0–100.0)
Platelets: 235 K/uL (ref 150–400)
RBC: 4.7 MIL/uL (ref 4.22–5.81)
RDW: 12.7 % (ref 11.5–15.5)
WBC: 8.4 K/uL (ref 4.0–10.5)
nRBC: 0 % (ref 0.0–0.2)

## 2024-09-03 LAB — PHOSPHORUS: Phosphorus: 3.4 mg/dL (ref 2.5–4.6)

## 2024-09-03 LAB — MAGNESIUM: Magnesium: 2.2 mg/dL (ref 1.7–2.4)

## 2024-09-03 MED ORDER — OXYCODONE-ACETAMINOPHEN 5-325 MG PO TABS
1.0000 | ORAL_TABLET | Freq: Four times a day (QID) | ORAL | Status: DC | PRN
  Administered 2024-09-03 – 2024-09-04 (×3): 1 via ORAL
  Filled 2024-09-03 (×3): qty 1

## 2024-09-03 MED ORDER — PERFLUTREN LIPID MICROSPHERE
1.0000 mL | INTRAVENOUS | Status: AC | PRN
  Administered 2024-09-03: 3 mL via INTRAVENOUS

## 2024-09-03 MED ORDER — HYDROCODONE-ACETAMINOPHEN 5-325 MG PO TABS: 1.0000 | ORAL_TABLET | Freq: Four times a day (QID) | ORAL | Status: DC | PRN

## 2024-09-03 NOTE — Care Management Important Message (Signed)
 Important Message  Patient Details  Name: Cody Alexander MRN: [id number] Date of Birth: Feb 21, 1956   Important Message Given:  Yes - Medicare IM     [name] 09/03/2024, 3:22 PM

## 2024-09-03 NOTE — Progress Notes (Addendum)
 OT Cancellation Note  Patient Details Name: Cody Alexander MRN: [id number] DOB: 11-18-1955   Cancelled Treatment:    Reason Eval/Treat Not Completed: Other (comment) noted in chart review patient is to have CAM boot and is WBAT in LLE with boot on; OT sent secure chat to RN to ensure boot was present before therapy eval, RN reports it was not and she had called down for it yesterday afternoon. OT also called supply chain to request boot be brought up; still has not been delivered to patients room 1.5 hours later. OT will follow up once necessary equipment is in place in order to be in compliance with MD orders.   Maryelizabeth CHRISTELLA Clause 09/03/2024, 9:58 AM

## 2024-09-03 NOTE — TOC Initial Note (Signed)
 Transition of Care [facility]) - Initial/Assessment Note    Patient Details  Name: Cody Alexander MRN: [id number] Date of Birth: 11-01-1955  Transition of Care [facility]) CM/SW Contact:    Lauraine JAYSON Carpen, LCSW Phone Number: 09/03/2024, 11:53 AM  Clinical Narrative:   Per Patient Cort, patient is active with [facility]. Liaison confirmed he is receiving PT, OT, RN, and social work. No further concerns. CSW will continue to follow patient for support and facilitate return home once stable.               Expected Discharge Plan: Home w Home Health Services Barriers to Discharge: Continued Medical Work up   Patient Goals and CMS Choice            Expected Discharge Plan and Services     Post Acute Care Choice: Resumption of Svcs/PTA Provider Living arrangements for the past 2 months: Single Family Home                           HH Arranged: RN, PT, OT, Social Work [facility] Agency: [facility] Date [facility] Agency Contacted: 09/03/24   Representative spoke with at [facility] Agency: [place]  Prior Living Arrangements/Services Living arrangements for the past 2 months: Single Family Home   Patient language and need for interpreter reviewed:: Yes        Need for Family Participation in Patient Care: Yes (Comment)   Current home services: Home OT, Home PT, Home RN, Other (comment) (Home social worker) Criminal Activity/Legal Involvement Pertinent to Current Situation/Hospitalization: No - Comment as needed  Activities of Daily Living      Permission Sought/Granted                  Emotional Assessment       Orientation: : Oriented to Self, Oriented to Place, Oriented to  Time, Oriented to Situation Alcohol / Substance Use: Not Applicable Psych Involvement: No (comment)  Admission diagnosis:  Shortness of breath [R06.02] Acute CHF (congestive heart failure) (HCC) [I50.9] Sepsis due to undetermined organism (HCC) [A41.9] Pneumonia due to infectious  organism, unspecified laterality, unspecified part of lung [J18.9] Patient Active Problem List   Diagnosis Date Noted   Left ankle pain 09/02/2024   Syncope 09/02/2024   Chest pain 12/19/2023   Chronic heart failure with preserved ejection fraction (HFpEF) (HCC) 12/19/2023   Frequent falls 12/19/2023   Dizziness 12/19/2023   Hematuria 09/28/2023   Hypertensive emergency 07/23/2023   CHF exacerbation (HCC) 07/23/2023   Respiratory failure (HCC) 06/12/2023   Goals of care, counseling/discussion 01/16/2023   History of kidney stones 01/06/2023   Hypokalemia 01/05/2023   Bilateral leg pain 11/10/2022   Chest pain    Syncope and collapse    Chronic back pain    Acute on chronic respiratory failure with hypoxia and hypercapnia (HCC) 03/14/2022   Diabetes mellitus without complication (HCC)    Acute exacerbation of chronic low back pain    Urinary tract infection    HLD (hyperlipidemia) 01/27/2022   CAD with history of stent angioplasty 01/27/2022   Fall at home, initial encounter 01/27/2022   Leg weakness, bilateral 01/27/2022   Hyperkalemia    Benign prostatic hyperplasia with urinary hesitancy    Lower abdominal pain    Elevated troponin    Maculopapular rash, generalized    Uncontrolled type 2 diabetes mellitus with hyperglycemia (HCC) 01/04/2021   Acute on chronic diastolic CHF (congestive heart failure) (HCC)  01/04/2021   CHF (congestive heart failure), NYHA class I, acute, diastolic (HCC) 01/04/2021   OSA (obstructive sleep apnea) 01/04/2021   Obesity (BMI 30-39.9) 01/04/2021   Chronic respiratory failure with hypoxia (HCC) 01/04/2021   Acute on chronic congestive heart failure (HCC)    Acute on chronic respiratory failure with hypoxia (HCC) 06/20/2020   NSTEMI (non-ST elevated myocardial infarction) (HCC) 02/25/2018   Obesity, Class III, BMI 40-49.9 (morbid obesity) (HCC) 01/19/2017   COPD (chronic obstructive pulmonary disease) (HCC) 05/13/2014   Uncontrolled type 2  diabetes mellitus with hyperglycemia, with long-term current use of insulin  (HCC) 05/13/2014   Essential hypertension 05/13/2014   PCP:  [employer] Pharmacy:   [id number] - [place], [place] - [address] [place] [address] Phone: [phone] Fax: [phone]  [facility] [address] [place], [place] - [address] [place] [address] Phone: [phone] Fax: [phone]  [facility] [address] [place] [address] Phone: [phone] Fax: [phone]     Social Drivers of Health (SDOH) Social History: SDOH Screenings   Food Insecurity: No Food Insecurity (12/20/2023)  Housing: Low Risk (12/20/2023)  Transportation Needs: No Transportation Needs (12/20/2023)  Utilities: Not At Risk (12/20/2023)  Alcohol Screen: Low Risk (07/23/2023)  Financial Resource Strain: Low Risk (07/23/2023)  Physical Activity: Inactive (09/11/2023)   Received from [facility]  Social Connections: Socially Integrated (12/20/2023)  Stress: Stress Concern Present (09/11/2023)   Received from [facility]  Tobacco Use: Medium Risk (09/01/2024)  Health Literacy: Medium Risk (09/11/2023)   Received from [facility]   SDOH Interventions:     Readmission Risk Interventions    09/29/2023    1:04 PM 07/23/2023    1:58 PM 06/25/2022   10:13 AM  Readmission Risk Prevention Plan  Transportation Screening Complete Complete Complete  PCP or Specialist Appt within 3-5 Days Complete Complete Complete  HRI or Home Care Consult Complete Complete Complete  Social Work Consult for Recovery Care Planning/Counseling Complete Complete Complete  Palliative Care Screening Not Applicable Not Applicable Not Applicable  Medication Review Oceanographer) Complete Complete Complete

## 2024-09-03 NOTE — Progress Notes (Signed)
 " Orthopedic progress note  REQUESTING PHYSICIAN: Leesa Kast, DO  Chief Complaint: Left ankle pain  HPI: Cody Alexander is a 69 y.o. male who presents today resting in bed fairly comfortably.  He is awaiting his MRI results of the left ankle that was performed yesterday.  He has not obtained his cam boot that was ordered yesterday as well.  PT/OT has been ordered but has yet to work with patient due to not receiving cam boot.  Past Medical History:  Diagnosis Date   CHF (congestive heart failure) (HCC)    COPD (chronic obstructive pulmonary disease) (HCC)    Diabetes mellitus without complication (HCC)    Hypertension    Neuropathy    Past Surgical History:  Procedure Laterality Date   LEFT HEART CATH AND CORONARY ANGIOGRAPHY Right 02/27/2018   Procedure: LEFT HEART CATH AND CORONARY ANGIOGRAPHY;  Surgeon: Fernand Denyse LABOR, MD;  Location: [facility] INVASIVE CV [facility];  Service: Cardiovascular;  Laterality: Right;   Social History   Socioeconomic History   Marital status: Single    Spouse name: Not on file   Number of children: 1   Years of education: Not on file   Highest education level: 12th grade  Occupational History   Occupation: Retired  Tobacco Use   Smoking status: Former    Types: Cigarettes   Smokeless tobacco: Never  Vaping Use   Vaping status: Never Used  Substance and Sexual Activity   Alcohol use: No   Drug use: Never   Sexual activity: Not on file  Other Topics Concern   Not on file  Social History Narrative   Not on file   Social Drivers of Health   Tobacco Use: Medium Risk (09/01/2024)   Patient History    Smoking Tobacco Use: Former    Smokeless Tobacco Use: Never    Passive Exposure: Not on Actuary Strain: Low Risk (07/23/2023)   Overall Financial Resource Strain (CARDIA)    Difficulty of Paying Living Expenses: Not very hard  Food Insecurity: No Food Insecurity (12/20/2023)   Hunger Vital Sign    Worried About Running Out of  Food in the Last Year: Never true    Ran Out of Food in the Last Year: Never true  Transportation Needs: No Transportation Needs (12/20/2023)   PRAPARE - Administrator, Civil Service (Medical): No    Lack of Transportation (Non-Medical): No  Physical Activity: Inactive (09/11/2023)   Received from [facility]   Exercise Vital Sign    On average, how many days per week do you engage in moderate to strenuous exercise (like a brisk walk)?: 0 days    On average, how many minutes do you engage in exercise at this level?: 0 min  Stress: Stress Concern Present (09/11/2023)   Received from [facility] - Occupational Stress Questionnaire    Feeling of Stress : To some extent  Social Connections: Socially Integrated (12/20/2023)   Social Connection and Isolation Panel    Frequency of Communication with Friends and Family: Three times a week    Frequency of Social Gatherings with Friends and Family: Three times a week    Attends Religious Services: More than 4 times per year    Active Member of Clubs or Organizations: Patient declined    Attends Banker Meetings: More than 4 times per year    Marital Status: Married  Depression (EYV7-0): Not on file  Alcohol Screen: Low Risk (07/23/2023)   Alcohol Screen    Last Alcohol Screening Score (AUDIT): 0  Housing: Low Risk (12/20/2023)   Housing Stability Vital Sign    Unable to Pay for Housing in the Last Year: No    Number of Times Moved in the Last Year: 0    Homeless in the Last Year: No  Utilities: Not At Risk (12/20/2023)   [facility] Utilities    Threatened with loss of utilities: No  Health Literacy: Medium Risk (09/11/2023)   Received from [facility] Literacy    How often do you need to have someone help you when you read instructions, pamphlets, or other written material from your doctor or pharmacy?: Sometimes   Family History  Problem Relation Age of Onset    Stroke Mother    Hypertension Father    Allergies[1] Prior to Admission medications  Medication Sig Start Date End Date Taking? Authorizing Provider  acetaminophen  (TYLENOL ) 500 MG tablet Take 500 mg by mouth every 8 (eight) hours as needed for mild pain (pain score 1-3) or moderate pain (pain score 4-6).   Yes [provider]  albuterol  (PROVENTIL  HFA) 108 (90 Base) MCG/ACT inhaler Inhale 2 puffs into the lungs every 4 (four) hours as needed. 06/19/23  Yes Awanda City, MD  atorvastatin  (LIPITOR ) 80 MG tablet Take 80 mg by mouth daily. 05/14/24  Yes [provider]  bumetanide  (BUMEX ) 1 MG tablet Take 3 mg by mouth daily.   Yes [provider]  lisinopril  (ZESTRIL ) 5 MG tablet Take 5 mg by mouth daily.   Yes [provider]  losartan  (COZAAR ) 25 MG tablet Take 25 mg by mouth daily. 07/14/24  Yes [provider]  ASPIRIN  EC 81 MG EC tablet Take 81 mg by mouth daily. Patient not taking: Reported on 09/01/2024 11/15/22   [provider]  budesonide -formoterol  (SYMBICORT ) 160-4.5 MCG/ACT inhaler Inhale 2 puffs into the lungs 2 (two) times daily. Patient not taking: Reported on 09/01/2024 06/19/23   Awanda City, MD  cephALEXin  (KEFLEX ) 500 MG capsule Take 1 capsule (500 mg total) by mouth 3 (three) times daily. Patient not taking: Reported on 09/01/2024 12/18/23   Dorothyann Drivers, MD  clotrimazole (LOTRIMIN) 1 % cream Apply 1 Application topically 2 (two) times daily. Patient not taking: Reported on 09/01/2024 05/14/24 05/14/25  [provider]  fluconazole  (DIFLUCAN ) 200 MG tablet Take 200 mg by mouth once a week. Patient not taking: Reported on 09/01/2024 05/14/24   [provider]  hydrocortisone  cream 1 % Apply topically 4 (four) times daily. Patient not taking: Reported on 09/01/2024 12/23/23   Dorinda Drue DASEN, MD  hydrOXYzine  (ATARAX ) 25 MG tablet Take by mouth every 6 (six) hours as needed for itching. Patient not taking: Reported on  12/19/2023 07/06/23   [provider]  ipratropium-albuterol  (DUONEB) 0.5-2.5 (3) MG/3ML SOLN Inhale 3 mLs by nebulization once every 6 (six) hours as needed. Patient not taking: Reported on 09/01/2024 06/19/23   Awanda City, MD  Omeprazole 20 MG TBEC Take 1 tablet by mouth every morning. Patient not taking: Reported on 09/01/2024 02/24/23   [provider]  spironolactone  (ALDACTONE ) 25 MG tablet Take 25 mg by mouth every morning. Patient not taking: Reported on 09/01/2024    [provider]   ECHOCARDIOGRAM COMPLETE Result Date: 09/03/2024    ECHOCARDIOGRAM REPORT   Patient Name:   Cody Alexander Date of Exam: 09/03/2024 Medical Rec #:  [id number]  Height:       73.0 in Accession #:    [id number]        Weight:       300.0 lb Date of Birth:  02/13/1956         BSA:          2.556 m Patient Age:    68 years          BP:           133/36 mmHg Patient Gender: M                 HR:           81 bpm. Exam Location:  [facility] Procedure: 2D Echo, Cardiac Doppler, Color Doppler and Intracardiac            Opacification Agent (Both Spectral and Color Flow Doppler were            utilized during procedure). Indications:     Syncope  History:         Patient has prior history of Echocardiogram examinations, most                  recent 01/07/2023. CHF, CAD, COPD; Risk Factors:Hypertension,                  Diabetes, Sleep Apnea and Dyslipidemia.  Sonographer:     Philomena Daring Referring Phys:  [id number] GJW A MANSY Diagnosing Phys: Caron Poser IMPRESSIONS  1. Left ventricular ejection fraction, by estimation, is 60 to 65%. The left ventricle has normal function. The left ventricle has no regional wall motion abnormalities. There is severe concentric left ventricular hypertrophy. Indeterminate diastolic filling due to E-A fusion. No SAM or LVOT gradient visualized.  2. Right ventricular systolic function is normal. The right ventricular size is normal.  3. The mitral valve is grossly normal. No  evidence of mitral valve regurgitation. No evidence of mitral stenosis.  4. The aortic valve has an indeterminant number of cusps. Aortic valve regurgitation is not visualized. No aortic stenosis is present.  5. Aortic dilatation noted. There is mild dilatation of the ascending aorta, measuring 41 mm.  6. The inferior vena cava is dilated in size with >50% respiratory variability, suggesting right atrial pressure of 8 mmHg. Comparison(s): A prior study was performed on 01/07/2023. No significant change from prior study. FINDINGS  Left Ventricle: Left ventricular ejection fraction, by estimation, is 60 to 65%. The left ventricle has normal function. The left ventricle has no regional wall motion abnormalities. Definity  contrast agent was given IV to delineate the left ventricular  endocardial borders. The left ventricular internal cavity size was normal in size. There is severe concentric left ventricular hypertrophy. Indeterminate diastolic filling due to E-A fusion. Right Ventricle: The right ventricular size is normal. Right vetricular wall thickness was not well visualized. Right ventricular systolic function is normal. Left Atrium: Left atrial size was normal in size. Right Atrium: Right atrial size was normal in size. Pericardium: Trivial pericardial effusion is present. Mitral Valve: The mitral valve is grossly normal. No evidence of mitral valve regurgitation. No evidence of mitral valve stenosis. Tricuspid Valve: The tricuspid valve is not well visualized. Tricuspid valve regurgitation is not demonstrated. No evidence of tricuspid stenosis. Aortic Valve: The aortic valve has an indeterminant number of cusps. Aortic valve regurgitation is not visualized. No aortic stenosis is present. Pulmonic Valve: The pulmonic valve was not well visualized. Pulmonic valve regurgitation is not visualized. No evidence  of pulmonic stenosis. Aorta: The aortic root is normal in size and structure and aortic dilatation noted.  There is mild dilatation of the ascending aorta, measuring 41 mm. Venous: The inferior vena cava is dilated in size with greater than 50% respiratory variability, suggesting right atrial pressure of 8 mmHg. IAS/Shunts: The interatrial septum was not well visualized.  LEFT VENTRICLE PLAX 2D LVIDd:         4.50 cm   Diastology LVIDs:         2.80 cm   LV e' medial:    6.20 cm/s LV PW:         1.50 cm   LV E/e' medial:  12.9 LV IVS:        1.80 cm   LV e' lateral:   5.00 cm/s LVOT diam:     2.10 cm   LV E/e' lateral: 16.0 LV SV:         76 LV SV Index:   30 LVOT Area:     3.46 cm  RIGHT VENTRICLE             IVC RV Basal diam:  3.80 cm     IVC diam: 2.40 cm RV Mid diam:    2.50 cm RV S prime:     18.20 cm/s TAPSE (M-mode): 2.6 cm LEFT ATRIUM             Index        RIGHT ATRIUM           Index LA diam:        3.40 cm 1.33 cm/m   RA Area:     19.70 cm LA Vol (A2C):   86.8 ml 33.96 ml/m  RA Volume:   51.80 ml  20.26 ml/m LA Vol (A4C):   49.7 ml 19.44 ml/m LA Biplane Vol: 66.1 ml 25.86 ml/m  AORTIC VALVE LVOT Vmax:   134.00 cm/s LVOT Vmean:  83.600 cm/s LVOT VTI:    0.218 m  AORTA Ao Root diam: 3.40 cm MITRAL VALVE               TRICUSPID VALVE MV Area (PHT): 3.89 cm    TR Peak grad:   8.5 mmHg MV Decel Time: 195 msec    TR Vmax:        146.00 cm/s MV E velocity: 79.90 cm/s MV A velocity: 85.90 cm/s  SHUNTS MV E/A ratio:  0.93        Systemic VTI:  0.22 m                            Systemic Diam: 2.10 cm Caron Poser Electronically signed by Caron Poser Signature Date/Time: 09/03/2024/9:04:22 AM    Final    MR ANKLE LEFT WO CONTRAST Result Date: 09/03/2024 CLINICAL DATA:  Achilles tendon pain. History of prior Achilles repair over 30 years ago. EXAM: MRI OF THE LEFT ANKLE WITHOUT CONTRAST TECHNIQUE: Multiplanar, multisequence MR imaging of the ankle was performed. No intravenous contrast was administered. COMPARISON:  Left foot radiographs dated 09/02/2024. FINDINGS: Peroneal: Peroneal longus tendon intact.  Peroneal brevis intact. Posteromedial: Posterior tibial tendon intact. Flexor hallucis longus tendon intact. Flexor digitorum longus tendon intact. Anterior: Tibialis anterior tendon intact. Extensor hallucis longus tendon intact Extensor digitorum longus tendon intact. Achilles: Fusiform thickening of the distal Achilles tendon with irregular curvilinear intrasubstance T2 hyperintense signal extending for a length of approximately 4.7 cm through the center of the  thickened distal Achilles tendon with possible extension through the posterior superficial fibers, concerning for partial-thickness tear with tendinosis. The distal margin of the partial-thickness tear is 4.5 cm proximal to the calcaneal insertion of the Achilles tendon. The calcaneal insertion of the Achilles tendon is intact without discrete tear. No evidence of tendon retraction. Plantar Fascia: Intact. LIGAMENTS Lateral: Anterior talofibular ligament intact. Calcaneofibular ligament intact. Posterior talofibular ligament intact. Anterior and posterior tibiofibular ligaments intact. Medial: Deltoid ligament intact. Spring ligament intact. CARTILAGE Ankle Joint: No joint effusion. Normal ankle mortise. No chondral defect. Subtalar Joints/Sinus Tarsi: Normal subtalar joints. No subtalar joint effusion. Normal sinus tarsi. Bones: No aggressive osseous lesion. No fracture or dislocation. Mild joint space narrowing of the talonavicular, naviculocuneiform, and midfoot articulations. Soft Tissue: Diffuse subcutaneous edema of the ankle. No loculated fluid collection. Generalized atrophy of the musculature. IMPRESSION: 1. Findings most compatible with partial-thickness tear of the distal Achilles tendon extending for a length of approximately 4.7 cm with the distal margin of the partial-thickness tear 4.5 cm proximal to the calcaneal insertion. The calcaneal insertion of the Achilles tendon is intact without discrete tear. Associated distal Achilles tendinosis.  2. Diffuse subcutaneous edema of the ankle. Electronically Signed   By: Harrietta Sherry M.D.   On: 09/03/2024 08:24   DG Foot 2 Views Left Result Date: 09/02/2024 EXAM: 2 VIEW(S) XRAY OF THE LEFT FOOT 09/02/2024 06:59:00 AM COMPARISON: None available. CLINICAL HISTORY: 69 year old male. Left ankle pain. FINDINGS: BONES AND JOINTS: Appearance of healed chronic fractures 2nd through 4th proximal phalanges. No acute fracture. No malalignment. Mild osteoarthritis of the first metatarsophalangeal joint. SOFT TISSUES: Diffuse soft tissue edema. No soft tissue gas. IMPRESSION: 1. No acute osseous abnormality identified about the left foot. Diffuse left foot soft tissue swelling. Electronically signed by: Helayne Hurst MD MD 09/02/2024 07:06 AM EST RP Workstation: [id number]   CT Angio Chest PE W and/or Wo Contrast Result Date: 09/01/2024 EXAM: CTA of the Chest with contrast for PE 09/01/2024 05:13:29 PM TECHNIQUE: CTA of the chest was performed without and with the administration of 75 mL of iohexol  (OMNIPAQUE ) 350 MG/ML injection. Multiplanar reformatted images are provided for review. MIP images are provided for review. Automated exposure control, iterative reconstruction, and/or weight based adjustment of the mA/kV was utilized to reduce the radiation dose to as low as reasonably achievable. COMPARISON: CT angiogram chest for 01/18/2024 and CT abdomen and pelvis 01/05/2023. CLINICAL HISTORY: left sided chest pain, hypoxia FINDINGS: PULMONARY ARTERIES: Pulmonary arteries are adequately opacified for evaluation. No pulmonary embolism. Main pulmonary artery is normal in caliber. MEDIASTINUM: The heart is mildly enlarged. Coronary atherosclerotic calcifications are noted. Aortic atherosclerotic calcifications are noted. There is no acute abnormality of the thoracic aorta. The pericardium demonstrates no acute abnormality. LYMPH NODES: No mediastinal, hilar or axillary lymphadenopathy. LUNGS AND PLEURA: Mild atelectatic  changes in both lower lobes. There are some mild patchy ground glass opacities in the lung bases bilaterally. No pleural effusion or pneumothorax. UPPER ABDOMEN: No significant interval change in peripheral mildly hyperdense area in the right lobe of the liver measuring 2.8 x 1.1 cm when compared to 01/05/2023. SOFT TISSUES AND BONES: No acute bone or soft tissue abnormality. IMPRESSION: 1. No pulmonary embolism. 2. Mild bibasilar atelectasis with mild patchy ground-glass opacities in the lung bases bilaterally. Findings may be related to mild edema or infectious/inflammatory process. 3. Mild cardiomegaly with coronary and aortic atherosclerotic calcifications. 4. No significant interval change in a peripheral mildly hyperdense right hepatic lesion measuring 2.8 x  1.1 cm compared to 01/05/2023, favored as benign. Electronically signed by: Greig Pique MD MD 09/01/2024 05:29 PM EST RP Workstation: [id number]   DG Chest 2 View Result Date: 09/01/2024 CLINICAL DATA:  Chest pain. EXAM: CHEST - 2 VIEW COMPARISON:  12/18/2023 FINDINGS: Cardiomegaly. Slightly increased densities in left mid lung could represent atelectasis with underlying scarring in this area. No overt pulmonary edema. No focal airspace disease. Trachea is midline. No large pleural effusions. No acute bone abnormality. IMPRESSION: 1. Slightly increased densities in the left mid lung could represent atelectasis with underlying scarring. 2. Cardiomegaly.  No pulmonary edema. Electronically Signed   By: Juliene Balder M.D.   On: 09/01/2024 14:41    Positive ROS: All other systems have been reviewed and were otherwise negative with the exception of those mentioned in the HPI and as above.  12 point ROS was performed.  Physical Exam: General: Alert and oriented.  No apparent distress.  Vascular:  Left foot:Dorsalis Pedis:  diminished Posterior Tibial:  diminished  Right foot: Dorsalis Pedis:  diminished Posterior Tibial:  diminished  Neuro:intact  sensation  Derm: On the left lower leg there is a long well-healed scar from previous surgical intervention along the Achilles region.  No open wounds.  Ortho/MS: There is an area of palpable defect along the Achilles at its watershed band area.  He can plantarflex his foot but to decipher if there is a partial tear versus full-thickness tear of the Achilles.  This could be old scar tissue from the previous repair as well.  I personally reviewed the x-rays that shows some calcification of the Achilles region.  No avulsion fracture.  MRI reviewed.  Appears show partial tear in the Achilles tendon parallel to the long axis of the tendon and is long but the tendon does not appear to be completely ruptured.  A lot of thickening likely from patient's previous surgery and tendinosis in general.  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  Show partial tear but no complete rupture.  Chronic tendinosis present. -Patient is not a surgical candidate and would recommend conservative care.  Patient may weight-bear in cam boot as tolerated.  If having more pain recommend trying to stay off the foot more.   - PT/OT has been ordered, appreciate recommendations. - Appreciate pain management recommendations through internal medicine.  Podiatry team to sign off at this time, patient can follow-up in outpatient clinic in 2 weeks for diabetic footcare and recheck of Achilles.  Prentice Lee, DPM    09/03/2024 11:09 AM       [1]  Allergies Allergen Reactions   Erythromycin Anaphylaxis and Swelling    Had eye swelling with erythromycin during a time when he had perf ear drum.  Tolerated azithromycin .   "

## 2024-09-03 NOTE — Progress Notes (Signed)
 " PROGRESS NOTE    Cody Alexander  FMW:[id number] DOB: [birth year] DOA: 09/01/2024 PCP: [facility]  Chief Complaint  Patient presents with   Chest Pain    Hospital Course:  Cody Alexander is a 69 year old male with diastolic CHF, COPD, chronic hypoxic respiratory failure on 3 L at baseline, type 2 diabetes, essential hypertension, peripheral neuropathy, who presents to the ED with a constellation of symptoms including midsternal chest pain, orthopnea, lower extremity edema, and ankle pain. In the ED proBNP 96, high-sensitivity troponin 66 with repeat 74.  Labs and vitals otherwise within normal limits. Chest CTA negative for PE, mild bibasilar atelectasis with mild patchy ground glass opacities in the lung bases bilaterally favoring edema.  Subjective: This morning patient reports he is still feeling generally unwell.  We had extensive discussion about the importance of close outpatient follow-up with cardiology and medication adherence.  He endorses understanding. He endorses multiple concerns about his living situation and inconsistent transportation and reports this is the major barrier to outpatient care  Objective: Vitals:   09/02/24 2021 09/02/24 2335 09/03/24 0502 09/03/24 0822  BP: (!) 150/96 (!) 157/97 133/86 (!) 158/85  Pulse: 81 82 80 78  Resp: 20 20 20 20   Temp: 97.9 F (36.6 C) (!) 97.5 F (36.4 C) 97.6 F (36.4 C) 98.2 F (36.8 C)  TempSrc:    Oral  SpO2: 99% 95% 98% 98%  Weight:      Height:        Intake/Output Summary (Last 24 hours) at 09/03/2024 1525 Last data filed at 09/03/2024 1112 Gross per 24 hour  Intake 200 ml  Output 1150 ml  Net -950 ml   Filed Weights   09/01/24 1923  Weight: 136.1 kg    Examination: General exam: Appears calm and comfortable, NAD  Respiratory system: poor Aeration bilaterally, 4 L  in place.  Dyspneic when speaking Cardiovascular system: S1 & S2 heard, RRR.  Neuro: Alert and oriented. No focal  neurological deficits. Extremities: Bilateral feet and lower legs demonstrates skin thickening, flaking, debris Skin: Well-defined maculopapular rash over back and buttocks.  Some excoriations   Assessment & Plan:  Principal Problem:   Acute on chronic diastolic CHF (congestive heart failure) (HCC) Active Problems:   Left ankle pain   Syncope   Elevated troponin   Essential hypertension   Acute on chronic diastolic CHF - Continue with IV Lasix  - Continues to diurese well.  Down 6 L.  May be slowing down - Continue to follow strict I's and O's-kidney function appears to be tolerating diuresis - Echocardiogram revealed known severe left ventricular hypertrophy, no regional wall motion abnormalities, indeterminate diastolic filling. - Given severity of LVH have reached out to [facility] cardiology.  Patient follows with [facility] cardiology outpatient though it has been sometime since he has seen them - Previously was referred to heart failure team but has had difficulty making appointments due to no transportation - Continue with PT/OT evals - Continue to titrate GDMT as tolerated  Elevated troponin - Likely secondary to acute CHF - No evidence of STEMI on EKG - Low threshold to repeat troponin or EKG if pain recurs - Chest pain is reproducible upon palpation - Echo without regional wall motion abnormality.  Cardiology consult as above  CAD with prior PCI - Resume home meds. - Last LHC in 2019 - Does not appear he is currently taking medications, have started aspirin .  Cardiology consultation as well  Syncope - Monitor on telemetry - Orthostatic  vitals: Negative  Left ankle pain Prior left Achilles tendon injury - Podiatry has been consulted.  No bony abnormality on plain film - MRI showing partial tear - Plan for conservative management for now.  Podiatry has ordered equalizer walking boot. - PT/OT ordered but they are awaiting boot arrival before evaluations. - Patient is not a  surgical candidate at this time  Hypertension - Resume home meds, resume gradually.  COPD Chronic hypoxic respiratory failure - Currently on baseline O2 - As needed DuoNebs  Type 2 diabetes - A1c 7.6% - Continue sliding scale for now  Rash - Has been seen outpatient by dermatology.  Recommend biopsy if this has not yet been performed outpatient - continue with triamcinolone  Eucerin cream for now  Body mass index is 39.58 kg/m. Obesity Class II - Outpatient follow up for lifestyle modification and risk factor management  Generalized deconditioning - Pending PT/OT evals when boot arrives.  Anticipate patient may benefit from SNF.  Otherwise he will need home health services  DVT prophylaxis: Lovenox    Code Status: Full Code Disposition: Inpatient pending clinical resolution, may need SNF  Consultants:  Treatment Team:  Consulting Physician: Lennie Barter, DPM  Procedures:    Antimicrobials:  Anti-infectives (From admission, onward)    None       Data Reviewed: I have personally reviewed following labs and imaging studies CBC: Recent Labs  Lab 09/01/24 1407 09/01/24 2006 09/03/24 0436  WBC 6.7 6.5 8.4  HGB 12.4* 11.7* 13.8  HCT 41.3 38.3* 44.7  MCV 97.2 96.7 95.1  PLT 210 207 235   Basic Metabolic Panel: Recent Labs  Lab 09/01/24 1407 09/01/24 2006 09/03/24 0436  NA 140  --  139  K 4.7  --  4.1  CL 96*  --  92*  CO2 40*  --  38*  GLUCOSE 168*  --  155*  BUN 16  --  16  CREATININE 0.91 0.92 0.92  CALCIUM  9.4  --  9.3  MG  --   --  2.2  PHOS  --   --  3.4   GFR: Estimated Creatinine Clearance: 112.9 mL/min (by C-G formula based on SCr of 0.92 mg/dL). Liver Function Tests: Recent Labs  Lab 09/03/24 0436  AST 26  ALT 18  ALKPHOS 75  BILITOT 1.0  PROT 7.3  ALBUMIN 4.0   CBG: Recent Labs  Lab 09/02/24 1824 09/02/24 2203 09/03/24 0820 09/03/24 1154  GLUCAP 196* 105* 156* 210*    No results found for this or any previous visit  (from the past 240 hours).   Radiology Studies: ECHOCARDIOGRAM COMPLETE Result Date: 09/03/2024    ECHOCARDIOGRAM REPORT   Patient Name:   Cody Alexander Date of Exam: 09/03/2024 Medical Rec #:  [id number]         Height:       73.0 in Accession #:    [id number]        Weight:       300.0 lb Date of Birth:  02/04/1956         BSA:          2.556 m Patient Age:    68 years          BP:           133/36 mmHg Patient Gender: M                 HR:           81 bpm. Exam Location:  [facility] Procedure: 2D Echo, Cardiac Doppler, Color Doppler and Intracardiac            Opacification Agent (Both Spectral and Color Flow Doppler were            utilized during procedure). Indications:     Syncope  History:         Patient has prior history of Echocardiogram examinations, most                  recent 01/07/2023. CHF, CAD, COPD; Risk Factors:Hypertension,                  Diabetes, Sleep Apnea and Dyslipidemia.  Sonographer:     Philomena Daring Referring Phys:  [id number] GJW A MANSY Diagnosing Phys: Caron Poser IMPRESSIONS  1. Left ventricular ejection fraction, by estimation, is 60 to 65%. The left ventricle has normal function. The left ventricle has no regional wall motion abnormalities. There is severe concentric left ventricular hypertrophy. Indeterminate diastolic filling due to E-A fusion. No SAM or LVOT gradient visualized.  2. Right ventricular systolic function is normal. The right ventricular size is normal.  3. The mitral valve is grossly normal. No evidence of mitral valve regurgitation. No evidence of mitral stenosis.  4. The aortic valve has an indeterminant number of cusps. Aortic valve regurgitation is not visualized. No aortic stenosis is present.  5. Aortic dilatation noted. There is mild dilatation of the ascending aorta, measuring 41 mm.  6. The inferior vena cava is dilated in size with >50% respiratory variability, suggesting right atrial pressure of 8 mmHg. Comparison(s): A prior study was performed on  01/07/2023. No significant change from prior study. FINDINGS  Left Ventricle: Left ventricular ejection fraction, by estimation, is 60 to 65%. The left ventricle has normal function. The left ventricle has no regional wall motion abnormalities. Definity  contrast agent was given IV to delineate the left ventricular  endocardial borders. The left ventricular internal cavity size was normal in size. There is severe concentric left ventricular hypertrophy. Indeterminate diastolic filling due to E-A fusion. Right Ventricle: The right ventricular size is normal. Right vetricular wall thickness was not well visualized. Right ventricular systolic function is normal. Left Atrium: Left atrial size was normal in size. Right Atrium: Right atrial size was normal in size. Pericardium: Trivial pericardial effusion is present. Mitral Valve: The mitral valve is grossly normal. No evidence of mitral valve regurgitation. No evidence of mitral valve stenosis. Tricuspid Valve: The tricuspid valve is not well visualized. Tricuspid valve regurgitation is not demonstrated. No evidence of tricuspid stenosis. Aortic Valve: The aortic valve has an indeterminant number of cusps. Aortic valve regurgitation is not visualized. No aortic stenosis is present. Pulmonic Valve: The pulmonic valve was not well visualized. Pulmonic valve regurgitation is not visualized. No evidence of pulmonic stenosis. Aorta: The aortic root is normal in size and structure and aortic dilatation noted. There is mild dilatation of the ascending aorta, measuring 41 mm. Venous: The inferior vena cava is dilated in size with greater than 50% respiratory variability, suggesting right atrial pressure of 8 mmHg. IAS/Shunts: The interatrial septum was not well visualized.  LEFT VENTRICLE PLAX 2D LVIDd:         4.50 cm   Diastology LVIDs:         2.80 cm   LV e' medial:    6.20 cm/s LV PW:         1.50 cm   LV E/e' medial:  12.9 LV IVS:  1.80 cm   LV e' lateral:   5.00 cm/s  LVOT diam:     2.10 cm   LV E/e' lateral: 16.0 LV SV:         76 LV SV Index:   30 LVOT Area:     3.46 cm  RIGHT VENTRICLE             IVC RV Basal diam:  3.80 cm     IVC diam: 2.40 cm RV Mid diam:    2.50 cm RV S prime:     18.20 cm/s TAPSE (M-mode): 2.6 cm LEFT ATRIUM             Index        RIGHT ATRIUM           Index LA diam:        3.40 cm 1.33 cm/m   RA Area:     19.70 cm LA Vol (A2C):   86.8 ml 33.96 ml/m  RA Volume:   51.80 ml  20.26 ml/m LA Vol (A4C):   49.7 ml 19.44 ml/m LA Biplane Vol: 66.1 ml 25.86 ml/m  AORTIC VALVE LVOT Vmax:   134.00 cm/s LVOT Vmean:  83.600 cm/s LVOT VTI:    0.218 m  AORTA Ao Root diam: 3.40 cm MITRAL VALVE               TRICUSPID VALVE MV Area (PHT): 3.89 cm    TR Peak grad:   8.5 mmHg MV Decel Time: 195 msec    TR Vmax:        146.00 cm/s MV E velocity: 79.90 cm/s MV A velocity: 85.90 cm/s  SHUNTS MV E/A ratio:  0.93        Systemic VTI:  0.22 m                            Systemic Diam: 2.10 cm Caron Poser Electronically signed by Caron Poser Signature Date/Time: 09/03/2024/9:04:22 AM    Final    MR ANKLE LEFT WO CONTRAST Result Date: 09/03/2024 CLINICAL DATA:  Achilles tendon pain. History of prior Achilles repair over 30 years ago. EXAM: MRI OF THE LEFT ANKLE WITHOUT CONTRAST TECHNIQUE: Multiplanar, multisequence MR imaging of the ankle was performed. No intravenous contrast was administered. COMPARISON:  Left foot radiographs dated 09/02/2024. FINDINGS: Peroneal: Peroneal longus tendon intact. Peroneal brevis intact. Posteromedial: Posterior tibial tendon intact. Flexor hallucis longus tendon intact. Flexor digitorum longus tendon intact. Anterior: Tibialis anterior tendon intact. Extensor hallucis longus tendon intact Extensor digitorum longus tendon intact. Achilles: Fusiform thickening of the distal Achilles tendon with irregular curvilinear intrasubstance T2 hyperintense signal extending for a length of approximately 4.7 cm through the center of the thickened  distal Achilles tendon with possible extension through the posterior superficial fibers, concerning for partial-thickness tear with tendinosis. The distal margin of the partial-thickness tear is 4.5 cm proximal to the calcaneal insertion of the Achilles tendon. The calcaneal insertion of the Achilles tendon is intact without discrete tear. No evidence of tendon retraction. Plantar Fascia: Intact. LIGAMENTS Lateral: Anterior talofibular ligament intact. Calcaneofibular ligament intact. Posterior talofibular ligament intact. Anterior and posterior tibiofibular ligaments intact. Medial: Deltoid ligament intact. Spring ligament intact. CARTILAGE Ankle Joint: No joint effusion. Normal ankle mortise. No chondral defect. Subtalar Joints/Sinus Tarsi: Normal subtalar joints. No subtalar joint effusion. Normal sinus tarsi. Bones: No aggressive osseous lesion. No fracture or dislocation. Mild joint space narrowing of the talonavicular, naviculocuneiform, and  midfoot articulations. Soft Tissue: Diffuse subcutaneous edema of the ankle. No loculated fluid collection. Generalized atrophy of the musculature. IMPRESSION: 1. Findings most compatible with partial-thickness tear of the distal Achilles tendon extending for a length of approximately 4.7 cm with the distal margin of the partial-thickness tear 4.5 cm proximal to the calcaneal insertion. The calcaneal insertion of the Achilles tendon is intact without discrete tear. Associated distal Achilles tendinosis. 2. Diffuse subcutaneous edema of the ankle. Electronically Signed   By: Harrietta Sherry M.D.   On: 09/03/2024 08:24   DG Foot 2 Views Left Result Date: 09/02/2024 EXAM: 2 VIEW(S) XRAY OF THE LEFT FOOT 09/02/2024 06:59:00 AM COMPARISON: None available. CLINICAL HISTORY: 69 year old male. Left ankle pain. FINDINGS: BONES AND JOINTS: Appearance of healed chronic fractures 2nd through 4th proximal phalanges. No acute fracture. No malalignment. Mild osteoarthritis of the  first metatarsophalangeal joint. SOFT TISSUES: Diffuse soft tissue edema. No soft tissue gas. IMPRESSION: 1. No acute osseous abnormality identified about the left foot. Diffuse left foot soft tissue swelling. Electronically signed by: Helayne Hurst MD MD 09/02/2024 07:06 AM EST RP Workstation: [id number]   CT Angio Chest PE W and/or Wo Contrast Result Date: 09/01/2024 EXAM: CTA of the Chest with contrast for PE 09/01/2024 05:13:29 PM TECHNIQUE: CTA of the chest was performed without and with the administration of 75 mL of iohexol  (OMNIPAQUE ) 350 MG/ML injection. Multiplanar reformatted images are provided for review. MIP images are provided for review. Automated exposure control, iterative reconstruction, and/or weight based adjustment of the mA/kV was utilized to reduce the radiation dose to as low as reasonably achievable. COMPARISON: CT angiogram chest for 01/18/2024 and CT abdomen and pelvis 01/05/2023. CLINICAL HISTORY: left sided chest pain, hypoxia FINDINGS: PULMONARY ARTERIES: Pulmonary arteries are adequately opacified for evaluation. No pulmonary embolism. Main pulmonary artery is normal in caliber. MEDIASTINUM: The heart is mildly enlarged. Coronary atherosclerotic calcifications are noted. Aortic atherosclerotic calcifications are noted. There is no acute abnormality of the thoracic aorta. The pericardium demonstrates no acute abnormality. LYMPH NODES: No mediastinal, hilar or axillary lymphadenopathy. LUNGS AND PLEURA: Mild atelectatic changes in both lower lobes. There are some mild patchy ground glass opacities in the lung bases bilaterally. No pleural effusion or pneumothorax. UPPER ABDOMEN: No significant interval change in peripheral mildly hyperdense area in the right lobe of the liver measuring 2.8 x 1.1 cm when compared to 01/05/2023. SOFT TISSUES AND BONES: No acute bone or soft tissue abnormality. IMPRESSION: 1. No pulmonary embolism. 2. Mild bibasilar atelectasis with mild patchy  ground-glass opacities in the lung bases bilaterally. Findings may be related to mild edema or infectious/inflammatory process. 3. Mild cardiomegaly with coronary and aortic atherosclerotic calcifications. 4. No significant interval change in a peripheral mildly hyperdense right hepatic lesion measuring 2.8 x 1.1 cm compared to 01/05/2023, favored as benign. Electronically signed by: Greig Pique MD MD 09/01/2024 05:29 PM EST RP Workstation: [id number]    Scheduled Meds:  aspirin   81 mg Oral Daily   atorvastatin   80 mg Oral Daily   enoxaparin  (LOVENOX ) injection  65 mg Subcutaneous Q24H   furosemide   40 mg Intravenous Q12H   insulin  aspart  0-15 Units Subcutaneous TID WC   insulin  aspart  0-5 Units Subcutaneous QHS   losartan   25 mg Oral Daily   spironolactone   25 mg Oral q morning   triamcinolone  0.1 % cream : eucerin   Topical TID   Continuous Infusions:   LOS: 2 days  MDM: Patient is high risk for one or  more organ failure.  They necessitate ongoing hospitalization for continued IV therapies and subsequent lab monitoring. Total time spent interpreting labs and vitals, reviewing the medical record, coordinating care amongst consultants and care team members, directly assessing and discussing care with the patient and/or family: 55 min  Kynzleigh Bandel, DO [facility]  To contact the attending physician between 7A-7P please use Epic Chat. To contact the covering physician during after hours 7P-7A, please review Amion.  09/03/2024, 3:25 PM   *This document has been created with the assistance of dictation software. Please excuse typographical errors. *   "

## 2024-09-03 NOTE — Consult Note (Addendum)
 WOC Nurse Consult Note: Reason for Consult: requested to assess a rash on the back. Pressure Injury POA: NA Wound type: No open wound. Chronic rash present for 2 years, being followed up at an  outpatient dermatology clinic. I spoke with the attending about this condition. She already prescribed the topical treatment (Triamcinolone  and Eurecin cream).  No further recommendation is needed.  WOC team will not plan to follow further. Please reconsult if further assistance is needed. Thank-you,  Lela Holm MSN, RN, CWCN, CNS.  (Phone [phone])

## 2024-09-03 NOTE — Consult Note (Signed)
 " CARDIOLOGY CONSULT NOTE               Patient ID: Cody Alexander MRN: [id number] DOB/AGE: 1955/09/09 69 y.o.  Admit date: 09/01/2024 Referring Physician Dr. Madison Hotter hospitalist Primary Physician [facility] health services Primary Cardiologist [facility] cardiology Reason for Consultation shortness of breath chest pain  HPI: 69 year old male history of chronic diastolic congestive heart failure COPD hypoxic respiratory failure on 3 L history of diabetes type 2 hypertension peripheral neuropathy recently had some mid chest pain orthopnea PND lower extremity edema ankle pain after a fall elevated BNP flat troponins.  Patient recently ruptured Achilles tendon CT of the chest negative for PE known coronary disease with PCI and stent in 2019.  Patient states to not be having any pain now.  Patchy ground glass and edema currently denies any shortness of breath and therefore reevaluation  Review of systems complete and found to be negative unless listed above     Past Medical History:  Diagnosis Date   CHF (congestive heart failure) (HCC)    COPD (chronic obstructive pulmonary disease) (HCC)    Diabetes mellitus without complication (HCC)    Hypertension    Neuropathy     Past Surgical History:  Procedure Laterality Date   LEFT HEART CATH AND CORONARY ANGIOGRAPHY Right 02/27/2018   Procedure: LEFT HEART CATH AND CORONARY ANGIOGRAPHY;  Surgeon: Fernand Denyse LABOR, MD;  Location: [facility] INVASIVE CV LAB;  Service: Cardiovascular;  Laterality: Right;    Medications Prior to Admission  Medication Sig Dispense Refill Last Dose/Taking   acetaminophen  (TYLENOL ) 500 MG tablet Take 500 mg by mouth every 8 (eight) hours as needed for mild pain (pain score 1-3) or moderate pain (pain score 4-6).   Unknown   albuterol  (PROVENTIL  HFA) 108 (90 Base) MCG/ACT inhaler Inhale 2 puffs into the lungs every 4 (four) hours as needed. 6.7 g 0 Unknown   atorvastatin  (LIPITOR ) 80 MG tablet Take 80 mg by mouth daily.    08/31/2024 Morning   bumetanide  (BUMEX ) 1 MG tablet Take 3 mg by mouth daily.   08/31/2024 Morning   lisinopril  (ZESTRIL ) 5 MG tablet Take 5 mg by mouth daily.   08/31/2024 Morning   losartan  (COZAAR ) 25 MG tablet Take 25 mg by mouth daily.   08/31/2024 Morning   ASPIRIN  EC 81 MG EC tablet Take 81 mg by mouth daily. (Patient not taking: Reported on 09/01/2024)   Not Taking   budesonide -formoterol  (SYMBICORT ) 160-4.5 MCG/ACT inhaler Inhale 2 puffs into the lungs 2 (two) times daily. (Patient not taking: Reported on 09/01/2024) 10.2 g 2 Not Taking   cephALEXin  (KEFLEX ) 500 MG capsule Take 1 capsule (500 mg total) by mouth 3 (three) times daily. (Patient not taking: Reported on 09/01/2024) 30 capsule 0 Not Taking   clotrimazole (LOTRIMIN) 1 % cream Apply 1 Application topically 2 (two) times daily. (Patient not taking: Reported on 09/01/2024)   Not Taking   fluconazole  (DIFLUCAN ) 200 MG tablet Take 200 mg by mouth once a week. (Patient not taking: Reported on 09/01/2024)   Not Taking   hydrocortisone  cream 1 % Apply topically 4 (four) times daily. (Patient not taking: Reported on 09/01/2024)   Not Taking   hydrOXYzine  (ATARAX ) 25 MG tablet Take by mouth every 6 (six) hours as needed for itching. (Patient not taking: Reported on 12/19/2023)   Not Taking   ipratropium-albuterol  (DUONEB) 0.5-2.5 (3) MG/3ML SOLN Inhale 3 mLs by nebulization once every 6 (six) hours as needed. (Patient not taking: Reported on  09/01/2024) 120 mL 1 Not Taking   Omeprazole 20 MG TBEC Take 1 tablet by mouth every morning. (Patient not taking: Reported on 09/01/2024)   Not Taking   spironolactone  (ALDACTONE ) 25 MG tablet Take 25 mg by mouth every morning. (Patient not taking: Reported on 09/01/2024)   Not Taking   Social History   Socioeconomic History   Marital status: Single    Spouse name: Not on file   Number of children: 1   Years of education: Not on file   Highest education level: 12th grade  Occupational History   Occupation: Retired   Tobacco Use   Smoking status: Former    Types: Cigarettes   Smokeless tobacco: Never  Vaping Use   Vaping status: Never Used  Substance and Sexual Activity   Alcohol use: No   Drug use: Never   Sexual activity: Not on file  Other Topics Concern   Not on file  Social History Narrative   Not on file   Social Drivers of Health   Tobacco Use: Medium Risk (09/01/2024)   Patient History    Smoking Tobacco Use: Former    Smokeless Tobacco Use: Never    Passive Exposure: Not on Actuary Strain: Low Risk (07/23/2023)   Overall Financial Resource Strain (CARDIA)    Difficulty of Paying Living Expenses: Not very hard  Food Insecurity: No Food Insecurity (09/03/2024)   Epic    Worried About Radiation Protection Practitioner of Food in the Last Year: Never true    Ran Out of Food in the Last Year: Never true  Transportation Needs: No Transportation Needs (09/03/2024)   Epic    Lack of Transportation (Medical): No    Lack of Transportation (Non-Medical): No  Physical Activity: Inactive (09/11/2023)   Received from [employer]   Exercise Vital Sign    On average, how many days per week do you engage in moderate to strenuous exercise (like a brisk walk)?: 0 days    On average, how many minutes do you engage in exercise at this level?: 0 min  Stress: Stress Concern Present (09/11/2023)   Received from [facility] - Occupational Stress Questionnaire    Feeling of Stress : To some extent  Social Connections: Unknown (09/03/2024)   Social Connection and Isolation Panel    Frequency of Communication with Friends and Family: Three times a week    Frequency of Social Gatherings with Friends and Family: Three times a week    Attends Religious Services: More than 4 times per year    Active Member of Clubs or Organizations: Patient declined    Attends Banker Meetings: More than 4 times per year    Marital Status: Patient declined  Intimate  Partner Violence: Not At Risk (09/03/2024)   Epic    Fear of Current or Ex-Partner: No    Emotionally Abused: No    Physically Abused: No    Sexually Abused: No  Depression (PHQ2-9): Not on file  Alcohol Screen: Low Risk (07/23/2023)   Alcohol Screen    Last Alcohol Screening Score (AUDIT): 0  Housing: Low Risk (09/03/2024)   Epic    Unable to Pay for Housing in the Last Year: No    Number of Times Moved in the Last Year: 0    Homeless in the Last Year: No  Utilities: Not At Risk (09/03/2024)   Epic    Threatened with loss of utilities: No  Health Literacy: Medium Risk (09/11/2023)   Received from [facility] Literacy    How often do you need to have someone help you when you read instructions, pamphlets, or other written material from your doctor or pharmacy?: Sometimes    Family History  Problem Relation Age of Onset   Stroke Mother    Hypertension Father       Review of systems complete and found to be negative unless listed above      PHYSICAL EXAM  General: Well developed, well nourished, in no acute distress HEENT:  Normocephalic and atramatic Neck:  No JVD.  Lungs: Clear bilaterally to auscultation and percussion. Heart: HRRR . Normal S1 and S2 without gallops or murmurs.  Abdomen: Bowel sounds are positive, abdomen soft and non-tender  Msk:  Back normal, normal gait. Normal strength and tone for age. Extremities: No clubbing, cyanosis or 3+edema.   Neuro: Alert and oriented X 3. Psych:  Good affect, responds appropriately  Labs:   Lab Results  Component Value Date   WBC 8.4 09/03/2024   HGB 13.8 09/03/2024   HCT 44.7 09/03/2024   MCV 95.1 09/03/2024   PLT 235 09/03/2024    Recent Labs  Lab 09/03/24 0436  NA 139  K 4.1  CL 92*  CO2 38*  BUN 16  CREATININE 0.92  CALCIUM  9.3  PROT 7.3  BILITOT 1.0  ALKPHOS 75  ALT 18  AST 26  GLUCOSE 155*   Lab Results  Component Value Date   CKTOTAL 63 12/18/2023   CKMB 1.3 08/20/2013    TROPONINI 0.09 (HH) 02/26/2018    Lab Results  Component Value Date   CHOL 148 01/28/2022   CHOL 173 01/04/2021   CHOL 131 02/26/2018   Lab Results  Component Value Date   HDL 39 (L) 01/28/2022   HDL 54 01/04/2021   HDL 30 (L) 02/26/2018   Lab Results  Component Value Date   LDLCALC 93 01/28/2022   LDLCALC 103 (H) 01/04/2021   LDLCALC 79 02/26/2018   Lab Results  Component Value Date   TRIG 131 01/14/2023   TRIG 120 01/11/2023   TRIG 228 (H) 01/09/2023   Lab Results  Component Value Date   CHOLHDL 3.8 01/28/2022   CHOLHDL 3.2 01/04/2021   CHOLHDL 4.4 02/26/2018   No results found for: LDLDIRECT    Radiology: ECHOCARDIOGRAM COMPLETE Result Date: 09/03/2024    ECHOCARDIOGRAM REPORT   Patient Name:   Cody Alexander Date of Exam: 09/03/2024 Medical Rec #:  [id number]         Height:       73.0 in Accession #:    [id number]        Weight:       300.0 lb Date of Birth:  07-13-1956         BSA:          2.556 m Patient Age:    68 years          BP:           133/36 mmHg Patient Gender: M                 HR:           81 bpm. Exam Location:  [facility] Procedure: 2D Echo, Cardiac Doppler, Color Doppler and Intracardiac            Opacification Agent (Both Spectral and Color Flow Doppler were  utilized during procedure). Indications:     Syncope  History:         Patient has prior history of Echocardiogram examinations, most                  recent 01/07/2023. CHF, CAD, COPD; Risk Factors:Hypertension,                  Diabetes, Sleep Apnea and Dyslipidemia.  Sonographer:     Philomena Daring Referring Phys:  [id number] GJW A MANSY Diagnosing Phys: Caron Poser IMPRESSIONS  1. Left ventricular ejection fraction, by estimation, is 60 to 65%. The left ventricle has normal function. The left ventricle has no regional wall motion abnormalities. There is severe concentric left ventricular hypertrophy. Indeterminate diastolic filling due to E-A fusion. No SAM or LVOT gradient visualized.  2. Right  ventricular systolic function is normal. The right ventricular size is normal.  3. The mitral valve is grossly normal. No evidence of mitral valve regurgitation. No evidence of mitral stenosis.  4. The aortic valve has an indeterminant number of cusps. Aortic valve regurgitation is not visualized. No aortic stenosis is present.  5. Aortic dilatation noted. There is mild dilatation of the ascending aorta, measuring 41 mm.  6. The inferior vena cava is dilated in size with >50% respiratory variability, suggesting right atrial pressure of 8 mmHg. Comparison(s): A prior study was performed on 01/07/2023. No significant change from prior study. FINDINGS  Left Ventricle: Left ventricular ejection fraction, by estimation, is 60 to 65%. The left ventricle has normal function. The left ventricle has no regional wall motion abnormalities. Definity  contrast agent was given IV to delineate the left ventricular  endocardial borders. The left ventricular internal cavity size was normal in size. There is severe concentric left ventricular hypertrophy. Indeterminate diastolic filling due to E-A fusion. Right Ventricle: The right ventricular size is normal. Right vetricular wall thickness was not well visualized. Right ventricular systolic function is normal. Left Atrium: Left atrial size was normal in size. Right Atrium: Right atrial size was normal in size. Pericardium: Trivial pericardial effusion is present. Mitral Valve: The mitral valve is grossly normal. No evidence of mitral valve regurgitation. No evidence of mitral valve stenosis. Tricuspid Valve: The tricuspid valve is not well visualized. Tricuspid valve regurgitation is not demonstrated. No evidence of tricuspid stenosis. Aortic Valve: The aortic valve has an indeterminant number of cusps. Aortic valve regurgitation is not visualized. No aortic stenosis is present. Pulmonic Valve: The pulmonic valve was not well visualized. Pulmonic valve regurgitation is not visualized.  No evidence of pulmonic stenosis. Aorta: The aortic root is normal in size and structure and aortic dilatation noted. There is mild dilatation of the ascending aorta, measuring 41 mm. Venous: The inferior vena cava is dilated in size with greater than 50% respiratory variability, suggesting right atrial pressure of 8 mmHg. IAS/Shunts: The interatrial septum was not well visualized.  LEFT VENTRICLE PLAX 2D LVIDd:         4.50 cm   Diastology LVIDs:         2.80 cm   LV e' medial:    6.20 cm/s LV PW:         1.50 cm   LV E/e' medial:  12.9 LV IVS:        1.80 cm   LV e' lateral:   5.00 cm/s LVOT diam:     2.10 cm   LV E/e' lateral: 16.0 LV SV:  76 LV SV Index:   30 LVOT Area:     3.46 cm  RIGHT VENTRICLE             IVC RV Basal diam:  3.80 cm     IVC diam: 2.40 cm RV Mid diam:    2.50 cm RV S prime:     18.20 cm/s TAPSE (M-mode): 2.6 cm LEFT ATRIUM             Index        RIGHT ATRIUM           Index LA diam:        3.40 cm 1.33 cm/m   RA Area:     19.70 cm LA Vol (A2C):   86.8 ml 33.96 ml/m  RA Volume:   51.80 ml  20.26 ml/m LA Vol (A4C):   49.7 ml 19.44 ml/m LA Biplane Vol: 66.1 ml 25.86 ml/m  AORTIC VALVE LVOT Vmax:   134.00 cm/s LVOT Vmean:  83.600 cm/s LVOT VTI:    0.218 m  AORTA Ao Root diam: 3.40 cm MITRAL VALVE               TRICUSPID VALVE MV Area (PHT): 3.89 cm    TR Peak grad:   8.5 mmHg MV Decel Time: 195 msec    TR Vmax:        146.00 cm/s MV E velocity: 79.90 cm/s MV A velocity: 85.90 cm/s  SHUNTS MV E/A ratio:  0.93        Systemic VTI:  0.22 m                            Systemic Diam: 2.10 cm Caron Poser Electronically signed by Caron Poser Signature Date/Time: 09/03/2024/9:04:22 AM    Final    MR ANKLE LEFT WO CONTRAST Result Date: 09/03/2024 CLINICAL DATA:  Achilles tendon pain. History of prior Achilles repair over 30 years ago. EXAM: MRI OF THE LEFT ANKLE WITHOUT CONTRAST TECHNIQUE: Multiplanar, multisequence MR imaging of the ankle was performed. No intravenous contrast was  administered. COMPARISON:  Left foot radiographs dated 09/02/2024. FINDINGS: Peroneal: Peroneal longus tendon intact. Peroneal brevis intact. Posteromedial: Posterior tibial tendon intact. Flexor hallucis longus tendon intact. Flexor digitorum longus tendon intact. Anterior: Tibialis anterior tendon intact. Extensor hallucis longus tendon intact Extensor digitorum longus tendon intact. Achilles: Fusiform thickening of the distal Achilles tendon with irregular curvilinear intrasubstance T2 hyperintense signal extending for a length of approximately 4.7 cm through the center of the thickened distal Achilles tendon with possible extension through the posterior superficial fibers, concerning for partial-thickness tear with tendinosis. The distal margin of the partial-thickness tear is 4.5 cm proximal to the calcaneal insertion of the Achilles tendon. The calcaneal insertion of the Achilles tendon is intact without discrete tear. No evidence of tendon retraction. Plantar Fascia: Intact. LIGAMENTS Lateral: Anterior talofibular ligament intact. Calcaneofibular ligament intact. Posterior talofibular ligament intact. Anterior and posterior tibiofibular ligaments intact. Medial: Deltoid ligament intact. Spring ligament intact. CARTILAGE Ankle Joint: No joint effusion. Normal ankle mortise. No chondral defect. Subtalar Joints/Sinus Tarsi: Normal subtalar joints. No subtalar joint effusion. Normal sinus tarsi. Bones: No aggressive osseous lesion. No fracture or dislocation. Mild joint space narrowing of the talonavicular, naviculocuneiform, and midfoot articulations. Soft Tissue: Diffuse subcutaneous edema of the ankle. No loculated fluid collection. Generalized atrophy of the musculature. IMPRESSION: 1. Findings most compatible with partial-thickness tear of the distal Achilles tendon extending for a length  of approximately 4.7 cm with the distal margin of the partial-thickness tear 4.5 cm proximal to the calcaneal insertion.  The calcaneal insertion of the Achilles tendon is intact without discrete tear. Associated distal Achilles tendinosis. 2. Diffuse subcutaneous edema of the ankle. Electronically Signed   By: Harrietta Sherry M.D.   On: 09/03/2024 08:24   DG Foot 2 Views Left Result Date: 09/02/2024 EXAM: 2 VIEW(S) XRAY OF THE LEFT FOOT 09/02/2024 06:59:00 AM COMPARISON: None available. CLINICAL HISTORY: 69 year old male. Left ankle pain. FINDINGS: BONES AND JOINTS: Appearance of healed chronic fractures 2nd through 4th proximal phalanges. No acute fracture. No malalignment. Mild osteoarthritis of the first metatarsophalangeal joint. SOFT TISSUES: Diffuse soft tissue edema. No soft tissue gas. IMPRESSION: 1. No acute osseous abnormality identified about the left foot. Diffuse left foot soft tissue swelling. Electronically signed by: Helayne Hurst MD MD 09/02/2024 07:06 AM EST RP Workstation: [id number]   CT Angio Chest PE W and/or Wo Contrast Result Date: 09/01/2024 EXAM: CTA of the Chest with contrast for PE 09/01/2024 05:13:29 PM TECHNIQUE: CTA of the chest was performed without and with the administration of 75 mL of iohexol  (OMNIPAQUE ) 350 MG/ML injection. Multiplanar reformatted images are provided for review. MIP images are provided for review. Automated exposure control, iterative reconstruction, and/or weight based adjustment of the mA/kV was utilized to reduce the radiation dose to as low as reasonably achievable. COMPARISON: CT angiogram chest for 01/18/2024 and CT abdomen and pelvis 01/05/2023. CLINICAL HISTORY: left sided chest pain, hypoxia FINDINGS: PULMONARY ARTERIES: Pulmonary arteries are adequately opacified for evaluation. No pulmonary embolism. Main pulmonary artery is normal in caliber. MEDIASTINUM: The heart is mildly enlarged. Coronary atherosclerotic calcifications are noted. Aortic atherosclerotic calcifications are noted. There is no acute abnormality of the thoracic aorta. The pericardium demonstrates no  acute abnormality. LYMPH NODES: No mediastinal, hilar or axillary lymphadenopathy. LUNGS AND PLEURA: Mild atelectatic changes in both lower lobes. There are some mild patchy ground glass opacities in the lung bases bilaterally. No pleural effusion or pneumothorax. UPPER ABDOMEN: No significant interval change in peripheral mildly hyperdense area in the right lobe of the liver measuring 2.8 x 1.1 cm when compared to 01/05/2023. SOFT TISSUES AND BONES: No acute bone or soft tissue abnormality. IMPRESSION: 1. No pulmonary embolism. 2. Mild bibasilar atelectasis with mild patchy ground-glass opacities in the lung bases bilaterally. Findings may be related to mild edema or infectious/inflammatory process. 3. Mild cardiomegaly with coronary and aortic atherosclerotic calcifications. 4. No significant interval change in a peripheral mildly hyperdense right hepatic lesion measuring 2.8 x 1.1 cm compared to 01/05/2023, favored as benign. Electronically signed by: Greig Pique MD MD 09/01/2024 05:29 PM EST RP Workstation: [id number]   DG Chest 2 View Result Date: 09/01/2024 CLINICAL DATA:  Chest pain. EXAM: CHEST - 2 VIEW COMPARISON:  12/18/2023 FINDINGS: Cardiomegaly. Slightly increased densities in left mid lung could represent atelectasis with underlying scarring in this area. No overt pulmonary edema. No focal airspace disease. Trachea is midline. No large pleural effusions. No acute bone abnormality. IMPRESSION: 1. Slightly increased densities in the left mid lung could represent atelectasis with underlying scarring. 2. Cardiomegaly.  No pulmonary edema. Electronically Signed   By: Juliene Balder M.D.   On: 09/01/2024 14:41    EKG: Normal sinus rhythm nonspecific ST-T wave changes first-degree AV block rate of 80  ASSESSMENT AND PLAN:  Acute on chronic diastolic dysfunction Syncope Elevated troponin Hypertension Obesity Coronary artery disease History of PCI and stent Left ankle pain/Achilles tendon  injury Lower extremity edema . Plan Agree admit to telemetry recommend follow-up EKGs troponins telemetry NT-proBNP Diuretic therapy to help with lower extremity edema shortness of breath Inhalers supplemental oxygen as necessary for respiratory distress and shortness History of PCI and stent last cath in 2019 Continue hypertension management and control Sleep study CPAP weight loss follow-up with pulmonary Echocardiogram for evaluation of left ventricle wall motion Unclear etiology of syncope with fall and injury to follow-up evaluate with orthostatic blood pressures maintain adequate hydration Diabetes A1c 7.6 continue current medications Obesity recommend weight loss exercise portion control Agree with podiatry improved for ruptures Achilles tendon    Signed: Cara JONETTA Lovelace MD 09/03/2024, 8:51 PM      "

## 2024-09-03 NOTE — Plan of Care (Signed)

## 2024-09-03 NOTE — Progress Notes (Addendum)
 PT Cancellation Note  Patient Details Name: Cody Alexander MRN: [id number] DOB: July 31, 1956   Cancelled Treatment:    Reason Eval/Treat Not Completed: Other (comment) (Still awaiting CAM walker boot. PT to follow up later)  Addendum: Follow up again with no boot in the room.   [name], PT, MPT  Randine LULLA Essex 09/03/2024, 9:58 AM

## 2024-09-03 NOTE — Plan of Care (Signed)

## 2024-09-04 DIAGNOSIS — I5033 Acute on chronic diastolic (congestive) heart failure: Secondary | ICD-10-CM | POA: Diagnosis not present

## 2024-09-04 LAB — GLUCOSE, CAPILLARY
Glucose-Capillary: 161 mg/dL — ABNORMAL HIGH (ref 70–99)
Glucose-Capillary: 210 mg/dL — ABNORMAL HIGH (ref 70–99)
Glucose-Capillary: 213 mg/dL — ABNORMAL HIGH (ref 70–99)
Glucose-Capillary: 206 mg/dL — ABNORMAL HIGH (ref 70–99)

## 2024-09-04 MED ORDER — OXYCODONE-ACETAMINOPHEN 5-325 MG PO TABS
2.0000 | ORAL_TABLET | Freq: Four times a day (QID) | ORAL | Status: DC | PRN
  Administered 2024-09-04 – 2024-09-07 (×6): 2 via ORAL
  Filled 2024-09-04 (×7): qty 2

## 2024-09-04 NOTE — Evaluation (Signed)
 Physical Therapy Evaluation Patient Details Name: Fitzhugh Vizcarrondo MRN: [id number] DOB: 01/09/1956 Today's Date: 09/04/2024  History of Present Illness  69 year old male with diastolic CHF, COPD, chronic hypoxic respiratory failure on 3 L at baseline, type 2 diabetes, essential hypertension, peripheral neuropathy, who presents to the ED with a constellation of symptoms including midsternal chest pain, orthopnea, lower extremity edema, and ankle pain. MRI reveals partial tear of prior L achilles tendon injury. Per podiatry, to wear CAM boot with mobility.  Clinical Impression  Patient admitted with the above. PTA, patient lives with 2 roommates (37 y/o and their daughter) and was independent with no AD. Patient requires max verbal/tactile cues throughout for safety due to impulsivity. Patient demonstrates myoclonic jerking throughout session resulting in near buckling of B knees and modA from therapist to recover. Ambulated total of 60' (x3 standing rest breaks) with RW and min-modA as patient continuously disregards cueing for close RW proximity and maintaining hands on RW instead of railing in hallway. Required up to 4L O2 for ambulation, however spO2 still dropping to 86-87% with short distances. Myoclonic jerking movements more noticeable with fatigue. Required assist from therapist x 2 to prevent falls 2/2 to myoclonic jerking causing knee buckling. Patient required extensive education on CAM boot, weightbearing, need for RW, and safety concerns regarding returning home, patient reluctant to understand and agree with education. Patient will benefit from skilled PT services during acute stay to address listed deficits. Patient will benefit from ongoing therapy at discharge to maximize functional independence and safety.       If plan is discharge home, recommend the following: A lot of help with walking and/or transfers;A little help with bathing/dressing/bathroom;Assistance with  cooking/housework;Assist for transportation;Help with stairs or ramp for entrance   Can travel by private vehicle   Yes    Equipment Recommendations Rolling Siddarth Hsiung (2 wheels);BSC/3in1  Recommendations for Other Services       Functional Status Assessment Patient has had a recent decline in their functional status and demonstrates the ability to make significant improvements in function in a reasonable and predictable amount of time.     Precautions / Restrictions Precautions Precautions: Fall Recall of Precautions/Restrictions: Impaired Restrictions Weight Bearing Restrictions Per Provider Order: Yes LLE Weight Bearing Per Provider Order: Weight bearing as tolerated Other Position/Activity Restrictions: with CAM boot      Mobility  Bed Mobility Overal bed mobility: Modified Independent                  Transfers Overall transfer level: Needs assistance Equipment used: Rolling Vannary Greening (2 wheels) Transfers: Sit to/from Stand Sit to Stand: Contact guard assist, +2 safety/equipment           General transfer comment: requires cues for safety, reaches for sink counter to pull himself up, generally unsafe    Ambulation/Gait Ambulation/Gait assistance: Min assist, Mod assist, +2 safety/equipment Gait Distance (Feet): 60 Feet (x 3 standing rest breaks) Assistive device: Rolling Leiyah Maultsby (2 wheels) Gait Pattern/deviations: Step-to pattern, Decreased stride length, Decreased stance time - left, Knees buckling Gait velocity: decreased     General Gait Details: myoclonic jerking movements more noticeable with fatigue. Knee buckling x 2 with modA to recover. Frequent cueing and assist to maintain proximity with RW  Stairs            Wheelchair Mobility     Tilt Bed    Modified Rankin (Stroke Patients Only)       Balance Overall balance assessment: Needs assistance Sitting-balance support: No upper  extremity supported, Feet supported Sitting balance-Leahy  Scale: Fair     Standing balance support: Bilateral upper extremity supported, During functional activity, Reliant on assistive device for balance Standing balance-Leahy Scale: Poor Standing balance comment: myclonic jerking in BLE causing him to nearly buckle several times                             Pertinent Vitals/Pain Pain Assessment Pain Assessment: Faces Faces Pain Scale: Hurts little more Pain Location: L foot Pain Descriptors / Indicators: Aching Pain Intervention(s): Limited activity within patient's tolerance, Monitored during session, Repositioned    Home Living Family/patient expects to be discharged to:: Private residence Living Arrangements: Non-relatives/Friends (92yo roommate and her daughter) Available Help at Discharge: Available PRN/intermittently Type of Home: Mobile home Home Access: Stairs to enter Entrance Stairs-Rails: Left Entrance Stairs-Number of Steps: 4-5   Home Layout: One level Home Equipment: [employer]ant (2 wheels);Cane - single point Additional Comments: floor concentrator, and porttable concentrator with battery that often dies during [employer] outings - per recent admission    Prior Function Prior Level of Function : Independent/Modified Independent;History of Falls (last six months);Needs assist             Mobility Comments: Pt reports limited walking due to weakness, use of RW. Pt reports multiple falls ADLs Comments: Pt reports increased difficulty with ADL and IADL 2/2 weakness and falls, endorses O2 sometimes comes off at night, light meal prep, does NOT drive so has been paying friend to take him places. Uses weekly pill box but does endorse forgetting to take his medications.     Extremity/Trunk Assessment   Upper Extremity Assessment Upper Extremity Assessment: Defer to OT evaluation    Lower Extremity Assessment Lower Extremity Assessment: Generalized weakness (myoclonic jerking noted)       Communication    Communication Communication: No apparent difficulties    Cognition Arousal: Alert Behavior During Therapy: Impulsive   PT - Cognitive impairments: No family/caregiver present to determine baseline                         Following commands: Impaired Following commands impaired: Follows multi-step commands inconsistently, Follows one step commands with increased time     Cueing Cueing Techniques: Verbal cues, Gestural cues, Tactile cues, Visual cues     General Comments General comments (skin integrity, edema, etc.): desats to mid 80's on 4L wiht exertion requiring standing rest breaks and VC for PLB to improve to 90-91%    Exercises     Assessment/Plan    PT Assessment Patient needs continued PT services  PT Problem List Decreased strength;Decreased balance;Decreased activity tolerance;Decreased mobility;Decreased coordination;Decreased cognition;Decreased knowledge of use of DME;Decreased safety awareness;Decreased knowledge of precautions;Cardiopulmonary status limiting activity;Pain       PT Treatment Interventions DME instruction;Stair training;Gait training;Functional mobility training;Therapeutic activities;Therapeutic exercise;Balance training;Neuromuscular re-education;Patient/family education    PT Goals (Current goals can be found in the Care Plan section)  Acute Rehab PT Goals Patient Stated Goal: to go home. Not to go to rehab because my friend died in a rest home PT Goal Formulation: With patient Time For Goal Achievement: 09/18/24 Potential to Achieve Goals: Fair    Frequency Min 2X/week     Co-evaluation PT/OT/SLP Co-Evaluation/Treatment: Yes Reason for Co-Treatment: For patient/therapist safety;To address functional/ADL transfers;Necessary to address cognition/behavior during functional activity PT goals addressed during session: Mobility/safety with mobility;Balance;Proper use of DME OT goals addressed during  session: ADL's and self-care;Proper  use of Adaptive equipment and DME       AM-PAC PT 6 Clicks Mobility  Outcome Measure Help needed turning from your back to your side while in a flat bed without using bedrails?: None Help needed moving from lying on your back to sitting on the side of a flat bed without using bedrails?: None Help needed moving to and from a bed to a chair (including a wheelchair)?: A Little Help needed standing up from a chair using your arms (e.g., wheelchair or bedside chair)?: A Little Help needed to walk in hospital room?: A Lot Help needed climbing 3-5 steps with a railing? : Total 6 Click Score: 17    End of Session Equipment Utilized During Treatment: Gait belt;Oxygen Activity Tolerance: Patient limited by fatigue;Patient tolerated treatment well Patient left: in bed;with call bell/phone within reach;with bed alarm set Nurse Communication: Mobility status PT Visit Diagnosis: Unsteadiness on feet (R26.81);Muscle weakness (generalized) (M62.81);Other abnormalities of gait and mobility (R26.89);History of falling (Z91.81);Difficulty in walking, not elsewhere classified (R26.2)    Time: 8851-8782 PT Time Calculation (min) (ACUTE ONLY): 29 min   Charges:   PT Evaluation $PT Eval Moderate Complexity: 1 Mod PT Treatments $Therapeutic Activity: 8-22 mins PT General Charges $$ ACUTE PT VISIT: 1 Visit         Maryanne Finder, PT, DPT Physical Therapist - [facility]  [facility]   Dexter Signor A Edwin Cherian 09/04/2024, 1:39 PM

## 2024-09-04 NOTE — Evaluation (Signed)
 Occupational Therapy Evaluation Patient Details Name: Cody Alexander MRN: [id number] DOB: 26-May-1956 Today's Date: 09/04/2024   History of Present Illness   69 year old male with diastolic CHF, COPD, chronic hypoxic respiratory failure on 3 L at baseline, type 2 diabetes, essential hypertension, peripheral neuropathy, who presents to the ED with a constellation of symptoms including midsternal chest pain, orthopnea, lower extremity edema, and ankle pain. MRI reveals partial tear of prior L achilles tendon injury. Per podiatry, to wear CAM boot with mobility.     Clinical Impressions Pt was seen for OT evaluation this date. Prior to hospital admission, pt was living with a 68yo male roommate and roommate's adult daughter. Pt lives in a mobile home with several steps to enter. Pt presents with deficits in strength, balance, activity tolerance, increased need for O2 with exertion, and very limited awareness of deficits and safety limiting his ability to perform ADL management at baseline level. Pt currently requires +2 for safety with all aspects of mobility using RW. Bumped to 4L with activity and still desats to 87-88% on 4L with exertion requiring standing rest break and VC for PLB. Pt demo's myoclonic jerking as he fatigues and very impulsive and unsafe with mobility, especially ADL transfers. Pt required assist to prevent fall at least twice due to jerking and pt attempting to leave RW behind despite strong VC. Pt would benefit from skilled OT services to address noted impairments and functional limitations (see below for any additional details) in order to maximize safety and independence while minimizing future risk of falls, injury, and readmission. Anticipate the need for follow up OT services upon acute hospital DC.    If plan is discharge home, recommend the following:   A lot of help with walking and/or transfers;A lot of help with bathing/dressing/bathroom;Direct supervision/assist  for medications management;Assistance with cooking/housework;Assist for transportation;Help with stairs or ramp for entrance     Functional Status Assessment   Patient has had a recent decline in their functional status and demonstrates the ability to make significant improvements in function in a reasonable and predictable amount of time.     Equipment Recommendations   Other (comment) (defer)     Recommendations for Other Services         Precautions/Restrictions   Precautions Precautions: Fall Recall of Precautions/Restrictions: Impaired Restrictions Weight Bearing Restrictions Per Provider Order: Yes LLE Weight Bearing Per Provider Order: Weight bearing as tolerated Other Position/Activity Restrictions: with CAM boot     Mobility Bed Mobility Overal bed mobility: Needs Assistance Bed Mobility: Supine to Sit, Sit to Supine     Supine to sit: Modified independent (Device/Increase time) Sit to supine: Modified independent (Device/Increase time)   General bed mobility comments: used momentum, increased effort    Transfers Overall transfer level: Needs assistance Equipment used: Rolling walker (2 wheels) Transfers: Sit to/from Stand Sit to Stand: Contact guard assist, +2 safety/equipment           General transfer comment: requires cues for safety, reaches for sink counter to pull himself up, generally unsafe      Balance Overall balance assessment: Needs assistance Sitting-balance support: No upper extremity supported, Feet supported Sitting balance-Leahy Scale: Fair     Standing balance support: Bilateral upper extremity supported, During functional activity, Reliant on assistive device for balance Standing balance-Leahy Scale: Poor Standing balance comment: myclonic jerking in BLE causing him to nearly buckle several times  ADL either performed or assessed with clinical judgement   ADL Overall ADL's : Needs  assistance/impaired                                       General ADL Comments: Pt required MAX A for donning and doffing CAM boot, set up for donning slip on shoe for R foot. Anticipate at least MIN A for LB ADL otherwise, pt very unsafe with transfers and ADL mobility.     Vision         Perception         Praxis         Pertinent Vitals/Pain Pain Assessment Pain Assessment: Faces Faces Pain Scale: Hurts little more Pain Location: L foot Pain Descriptors / Indicators: Aching Pain Intervention(s): Monitored during session, Repositioned     Extremity/Trunk Assessment Upper Extremity Assessment Upper Extremity Assessment: Overall WFL for tasks assessed   Lower Extremity Assessment Lower Extremity Assessment: Generalized weakness;Defer to PT evaluation (myoclonic jerking in BLE)       Communication Communication Communication: No apparent difficulties   Cognition Arousal: Alert Behavior During Therapy: Impulsive Cognition: No family/caregiver present to determine baseline             OT - Cognition Comments: impulsive, decr attention span                 Following commands: Impaired Following commands impaired: Follows multi-step commands inconsistently, Follows one step commands with increased time     Cueing  General Comments   Cueing Techniques: Verbal cues;Gestural cues;Tactile cues;Visual cues  desats to mid 80's on 4L wiht exertion requiring standing rest breaks and VC for PLB to improve to 90-91%   Exercises Other Exercises Other Exercises: Pt edu in falls prevention, role of acute OT, recommendations, need for CAM boot   Shoulder Instructions      Home Living Family/patient expects to be discharged to:: Private residence Living Arrangements: Non-relatives/Friends (91yo roommate and her daughter) Available Help at Discharge: Available PRN/intermittently Type of Home: Mobile home Home Access: Stairs to enter Entrance  Stairs-Number of Steps: 4-5 Entrance Stairs-Rails: Left Home Layout: One level     Bathroom Shower/Tub: Chief Strategy Officer: Standard Bathroom Accessibility: No   Home Equipment: Agricultural Consultant (2 wheels);Cane - single point   Additional Comments: floor concentrator, and porttable concentrator with battery that often dies during [employer] outings - per recent admission      Prior Functioning/Environment Prior Level of Function : Independent/Modified Independent;History of Falls (last six months);Needs assist             Mobility Comments: Pt reports limited walking due to weakness, use of RW. Pt reports multiple falls ADLs Comments: Pt reports increased difficulty with ADL and IADL 2/2 weakness and falls, endorses O2 sometimes comes off at night, light meal prep, does NOT drive so has been paying friend to take him places. Uses weekly pill box but does endorse forgetting to take his medications.    OT Problem List: Decreased strength;Cardiopulmonary status limiting activity;Decreased safety awareness;Decreased activity tolerance;Impaired balance (sitting and/or standing);Decreased knowledge of use of DME or AE;Decreased cognition;Obesity;Decreased knowledge of precautions   OT Treatment/Interventions: Self-care/ADL training;Therapeutic exercise;Therapeutic activities;DME and/or AE instruction;Energy conservation;Patient/family education;Balance training      OT Goals(Current goals can be found in the care plan section)   Acute Rehab OT Goals Patient Stated Goal: go home OT  Goal Formulation: With patient Time For Goal Achievement: 09/18/24 Potential to Achieve Goals: Good ADL Goals Pt Will Perform Lower Body Dressing: with modified independence;sitting/lateral leans;with adaptive equipment;sit to/from stand Pt Will Transfer to Toilet: with modified independence;ambulating (LRAD) Pt Will Perform Toileting - Clothing Manipulation and hygiene: with modified  independence;with adaptive equipment;sitting/lateral leans;sit to/from stand Additional ADL Goal #1: Pt will verbalize plan to implement at least 2 learned falls prevention strategies.   OT Frequency:  Min 2X/week    Co-evaluation PT/OT/SLP Co-Evaluation/Treatment: Yes Reason for Co-Treatment: For patient/therapist safety;To address functional/ADL transfers;Necessary to address cognition/behavior during functional activity PT goals addressed during session: Mobility/safety with mobility;Balance;Proper use of DME OT goals addressed during session: ADL's and self-care;Proper use of Adaptive equipment and DME      AM-PAC OT 6 Clicks Daily Activity     Outcome Measure Help from another person eating meals?: None Help from another person taking care of personal grooming?: None Help from another person toileting, which includes using toliet, bedpan, or urinal?: A Little Help from another person bathing (including washing, rinsing, drying)?: A Little Help from another person to put on and taking off regular upper body clothing?: None Help from another person to put on and taking off regular lower body clothing?: A Little 6 Click Score: 21   End of Session Equipment Utilized During Treatment: Rolling walker (2 wheels);Oxygen Nurse Communication: Mobility status  Activity Tolerance: Patient tolerated treatment well Patient left: in bed;with call bell/phone within reach;with bed alarm set  OT Visit Diagnosis: Unsteadiness on feet (R26.81);Repeated falls (R29.6);Muscle weakness (generalized) (M62.81)                Time: 8851-8784 OT Time Calculation (min): 27 min Charges:  OT General Charges $OT Visit: 1 Visit OT Evaluation $OT Eval Moderate Complexity: 1 Mod  Warren SAUNDERS., MPH, MS, OTR/L ascom [phone] 09/04/2024, 1:28 PM

## 2024-09-04 NOTE — Progress Notes (Signed)
 [facility] Cardiology    SUBJECTIVE: Shortness of breath dyspnea obstructive sleep apnea COPD coronary disease diastolic dysfunction lower extremity edema continue current therapy.  Denies any chest pain still has some dyspnea states he is not feeling really well   Vitals:   09/04/24 0431 09/04/24 0431 09/04/24 0759 09/04/24 1137  BP: (!) 140/91 (!) 140/91 126/76 115/63  Pulse: 91 91 81 85  Resp: 20 20 18    Temp: 97.9 F (36.6 C) 97.9 F (36.6 C) 97.9 F (36.6 C) 97.9 F (36.6 C)  TempSrc:   Oral Oral  SpO2: 96% 96% 96% 96%  Weight:      Height:         Intake/Output Summary (Last 24 hours) at 09/04/2024 1438 Last data filed at 09/04/2024 1435 Gross per 24 hour  Intake 680 ml  Output 1300 ml  Net -620 ml      PHYSICAL EXAM  General: Well developed, well nourished, in no acute distress HEENT:  Normocephalic and atramatic Neck:  No JVD.  Lungs: Clear bilaterally to auscultation and percussion. Heart: HRRR . Normal S1 and S2 without gallops or murmurs.  Abdomen: Bowel sounds are positive, abdomen soft and non-tender  Msk:  Back normal, normal gait. Normal strength and tone for age. Extremities: No clubbing, cyanosis or edema.   Neuro: Alert and oriented X 3. Psych:  Good affect, responds appropriately   LABS: Basic Metabolic Panel: Recent Labs    09/01/24 2006 09/03/24 0436  NA  --  139  K  --  4.1  CL  --  92*  CO2  --  38*  GLUCOSE  --  155*  BUN  --  16  CREATININE 0.92 0.92  CALCIUM   --  9.3  MG  --  2.2  PHOS  --  3.4   Liver Function Tests: Recent Labs    09/03/24 0436  AST 26  ALT 18  ALKPHOS 75  BILITOT 1.0  PROT 7.3  ALBUMIN 4.0   No results for input(s): LIPASE, AMYLASE in the last 72 hours. CBC: Recent Labs    09/01/24 2006 09/03/24 0436  WBC 6.5 8.4  HGB 11.7* 13.8  HCT 38.3* 44.7  MCV 96.7 95.1  PLT 207 235   Cardiac Enzymes: No results for input(s): CKTOTAL, CKMB, CKMBINDEX, TROPONINI in the last 72  hours. BNP: Invalid input(s): POCBNP D-Dimer: No results for input(s): DDIMER in the last 72 hours. Hemoglobin A1C: No results for input(s): HGBA1C in the last 72 hours. Fasting Lipid Panel: No results for input(s): CHOL, HDL, LDLCALC, TRIG, CHOLHDL, LDLDIRECT in the last 72 hours. Thyroid Function Tests: No results for input(s): TSH, T4TOTAL, T3FREE, THYROIDAB in the last 72 hours.  Invalid input(s): FREET3 Anemia Panel: No results for input(s): VITAMINB12, FOLATE, FERRITIN, TIBC, IRON, RETICCTPCT in the last 72 hours.  ECHOCARDIOGRAM COMPLETE Result Date: 09/03/2024    ECHOCARDIOGRAM REPORT   Patient Name:   CAMDAN BURDI Date of Exam: 09/03/2024 Medical Rec #:  [id number]         Height:       73.0 in Accession #:    [id number]        Weight:       300.0 lb Date of Birth:  1956-06-24         BSA:          2.556 m Patient Age:    69 years          BP:  133/36 mmHg Patient Gender: M                 HR:           81 bpm. Exam Location:  [facility] Procedure: 2D Echo, Cardiac Doppler, Color Doppler and Intracardiac            Opacification Agent (Both Spectral and Color Flow Doppler were            utilized during procedure). Indications:     Syncope  History:         Patient has prior history of Echocardiogram examinations, most                  recent 01/07/2023. CHF, CAD, COPD; Risk Factors:Hypertension,                  Diabetes, Sleep Apnea and Dyslipidemia.  Sonographer:     Philomena Daring Referring Phys:  [id number] GJW A MANSY Diagnosing Phys: Caron Poser IMPRESSIONS  1. Left ventricular ejection fraction, by estimation, is 60 to 65%. The left ventricle has normal function. The left ventricle has no regional wall motion abnormalities. There is severe concentric left ventricular hypertrophy. Indeterminate diastolic filling due to E-A fusion. No SAM or LVOT gradient visualized.  2. Right ventricular systolic function is normal. The right ventricular size  is normal.  3. The mitral valve is grossly normal. No evidence of mitral valve regurgitation. No evidence of mitral stenosis.  4. The aortic valve has an indeterminant number of cusps. Aortic valve regurgitation is not visualized. No aortic stenosis is present.  5. Aortic dilatation noted. There is mild dilatation of the ascending aorta, measuring 41 mm.  6. The inferior vena cava is dilated in size with >50% respiratory variability, suggesting right atrial pressure of 8 mmHg. Comparison(s): A prior study was performed on 01/07/2023. No significant change from prior study. FINDINGS  Left Ventricle: Left ventricular ejection fraction, by estimation, is 60 to 65%. The left ventricle has normal function. The left ventricle has no regional wall motion abnormalities. Definity  contrast agent was given IV to delineate the left ventricular  endocardial borders. The left ventricular internal cavity size was normal in size. There is severe concentric left ventricular hypertrophy. Indeterminate diastolic filling due to E-A fusion. Right Ventricle: The right ventricular size is normal. Right vetricular wall thickness was not well visualized. Right ventricular systolic function is normal. Left Atrium: Left atrial size was normal in size. Right Atrium: Right atrial size was normal in size. Pericardium: Trivial pericardial effusion is present. Mitral Valve: The mitral valve is grossly normal. No evidence of mitral valve regurgitation. No evidence of mitral valve stenosis. Tricuspid Valve: The tricuspid valve is not well visualized. Tricuspid valve regurgitation is not demonstrated. No evidence of tricuspid stenosis. Aortic Valve: The aortic valve has an indeterminant number of cusps. Aortic valve regurgitation is not visualized. No aortic stenosis is present. Pulmonic Valve: The pulmonic valve was not well visualized. Pulmonic valve regurgitation is not visualized. No evidence of pulmonic stenosis. Aorta: The aortic root is normal  in size and structure and aortic dilatation noted. There is mild dilatation of the ascending aorta, measuring 41 mm. Venous: The inferior vena cava is dilated in size with greater than 50% respiratory variability, suggesting right atrial pressure of 8 mmHg. IAS/Shunts: The interatrial septum was not well visualized.  LEFT VENTRICLE PLAX 2D LVIDd:         4.50 cm   Diastology LVIDs:  2.80 cm   LV e' medial:    6.20 cm/s LV PW:         1.50 cm   LV E/e' medial:  12.9 LV IVS:        1.80 cm   LV e' lateral:   5.00 cm/s LVOT diam:     2.10 cm   LV E/e' lateral: 16.0 LV SV:         76 LV SV Index:   30 LVOT Area:     3.46 cm  RIGHT VENTRICLE             IVC RV Basal diam:  3.80 cm     IVC diam: 2.40 cm RV Mid diam:    2.50 cm RV S prime:     18.20 cm/s TAPSE (M-mode): 2.6 cm LEFT ATRIUM             Index        RIGHT ATRIUM           Index LA diam:        3.40 cm 1.33 cm/m   RA Area:     19.70 cm LA Vol (A2C):   86.8 ml 33.96 ml/m  RA Volume:   51.80 ml  20.26 ml/m LA Vol (A4C):   49.7 ml 19.44 ml/m LA Biplane Vol: 66.1 ml 25.86 ml/m  AORTIC VALVE LVOT Vmax:   134.00 cm/s LVOT Vmean:  83.600 cm/s LVOT VTI:    0.218 m  AORTA Ao Root diam: 3.40 cm MITRAL VALVE               TRICUSPID VALVE MV Area (PHT): 3.89 cm    TR Peak grad:   8.5 mmHg MV Decel Time: 195 msec    TR Vmax:        146.00 cm/s MV E velocity: 79.90 cm/s MV A velocity: 85.90 cm/s  SHUNTS MV E/A ratio:  0.93        Systemic VTI:  0.22 m                            Systemic Diam: 2.10 cm Caron Poser Electronically signed by Caron Poser Signature Date/Time: 09/03/2024/9:04:22 AM    Final    MR ANKLE LEFT WO CONTRAST Result Date: 09/03/2024 CLINICAL DATA:  Achilles tendon pain. History of prior Achilles repair over 30 years ago. EXAM: MRI OF THE LEFT ANKLE WITHOUT CONTRAST TECHNIQUE: Multiplanar, multisequence MR imaging of the ankle was performed. No intravenous contrast was administered. COMPARISON:  Left foot radiographs dated 09/02/2024.  FINDINGS: Peroneal: Peroneal longus tendon intact. Peroneal brevis intact. Posteromedial: Posterior tibial tendon intact. Flexor hallucis longus tendon intact. Flexor digitorum longus tendon intact. Anterior: Tibialis anterior tendon intact. Extensor hallucis longus tendon intact Extensor digitorum longus tendon intact. Achilles: Fusiform thickening of the distal Achilles tendon with irregular curvilinear intrasubstance T2 hyperintense signal extending for a length of approximately 4.7 cm through the center of the thickened distal Achilles tendon with possible extension through the posterior superficial fibers, concerning for partial-thickness tear with tendinosis. The distal margin of the partial-thickness tear is 4.5 cm proximal to the calcaneal insertion of the Achilles tendon. The calcaneal insertion of the Achilles tendon is intact without discrete tear. No evidence of tendon retraction. Plantar Fascia: Intact. LIGAMENTS Lateral: Anterior talofibular ligament intact. Calcaneofibular ligament intact. Posterior talofibular ligament intact. Anterior and posterior tibiofibular ligaments intact. Medial: Deltoid ligament intact. Spring ligament intact. CARTILAGE Ankle Joint: No  joint effusion. Normal ankle mortise. No chondral defect. Subtalar Joints/Sinus Tarsi: Normal subtalar joints. No subtalar joint effusion. Normal sinus tarsi. Bones: No aggressive osseous lesion. No fracture or dislocation. Mild joint space narrowing of the talonavicular, naviculocuneiform, and midfoot articulations. Soft Tissue: Diffuse subcutaneous edema of the ankle. No loculated fluid collection. Generalized atrophy of the musculature. IMPRESSION: 1. Findings most compatible with partial-thickness tear of the distal Achilles tendon extending for a length of approximately 4.7 cm with the distal margin of the partial-thickness tear 4.5 cm proximal to the calcaneal insertion. The calcaneal insertion of the Achilles tendon is intact without  discrete tear. Associated distal Achilles tendinosis. 2. Diffuse subcutaneous edema of the ankle. Electronically Signed   By: Harrietta Sherry M.D.   On: 09/03/2024 08:24     Echo normal left ventricular function EF of 60%  TELEMETRY: Normal sinus rhythm rate of 75 nonspecific ST-T wave change:  ASSESSMENT AND PLAN:  Principal Problem:   Acute on chronic diastolic CHF (congestive heart failure) (HCC) Active Problems:   Essential hypertension   Elevated troponin   Left ankle pain   Syncope    Plan Acute on chronic diastolic congestive heart failure continue diuretic therapy as well as blood pressure management and control Elevated troponin consistent with demand ischemia consider ischemic workup prior to discharge Hypertension continue aggressive hypertension management control Obesity recommend modest weight loss exercise portion control Lower extremity edema continue diuretic therapy recommend support stockings elevation Coronary disease history of PCI stenting denies any recent chest pain or angina continue current therapy Obstructive sleep apnea recommend sleep study CPAP weight loss Continue respiratory support via pulmonary inhalers supplemental oxygen heart failure therapy   Cara JONETTA Lovelace, MD, 09/04/2024 2:38 PM

## 2024-09-04 NOTE — Plan of Care (Signed)

## 2024-09-04 NOTE — TOC Progression Note (Signed)
 Transition of Care [facility]) - Progression Note    Patient Details  Name: Cody Alexander MRN: [id number] Date of Birth: 1955/12/20  [facility] CM/SW Contact  Victory Jackquline RAMAN, RN Phone Number: 09/04/2024, 5:24 PM  Clinical Narrative:    RNCM spoke to the patient, introduced myself and my role and explained that discharge planning would be discussed. PT is recommending STR. Patient is not in agreement with STR. States he doesn't feel like he needs it. He has only worked with PT once since he's been admitted. RNCM will continue to follow for discharge planning needs.   Expected Discharge Plan: Home w Home Health Services Barriers to Discharge: Continued Medical Work up               Expected Discharge Plan and Services     Post Acute Care Choice: Resumption of Svcs/PTA Provider Living arrangements for the past 2 months: Single Family Home                           HH Arranged: RN, PT, OT, Social Work [facility] Agency: [facility] Date [facility] Agency Contacted: 09/03/24   Representative spoke with at [facility] Agency: [place]   Social Drivers of Health (SDOH) Interventions SDOH Screenings   Food Insecurity: No Food Insecurity (09/03/2024)  Housing: Low Risk (09/03/2024)  Transportation Needs: No Transportation Needs (09/03/2024)  Utilities: Not At Risk (09/03/2024)  Alcohol Screen: Low Risk (07/23/2023)  Financial Resource Strain: Low Risk (07/23/2023)  Physical Activity: Inactive (09/11/2023)   Received from [facility]  Social Connections: Unknown (09/03/2024)  Stress: Stress Concern Present (09/11/2023)   Received from [facility]  Tobacco Use: Medium Risk (09/01/2024)  Health Literacy: Medium Risk (09/11/2023)   Received from [facility]    Readmission Risk Interventions    09/29/2023    1:04 PM 07/23/2023    1:58 PM 06/25/2022   10:13 AM  Readmission Risk Prevention Plan  Transportation Screening Complete Complete Complete  PCP or Specialist  Appt within 3-5 Days Complete Complete Complete  HRI or Home Care Consult Complete Complete Complete  Social Work Consult for Recovery Care Planning/Counseling Complete Complete Complete  Palliative Care Screening Not Applicable Not Applicable Not Applicable  Medication Review Oceanographer) Complete Complete Complete

## 2024-09-04 NOTE — Progress Notes (Signed)
 " PROGRESS NOTE    Cody Alexander  FMW:[id number] DOB: 08-Jun-1956 DOA: 09/01/2024 PCP: [facility]  Chief Complaint  Patient presents with   Chest Pain    Hospital Course:  Cody Alexander is a 68 year old male with diastolic CHF, COPD, chronic hypoxic respiratory failure on 3 L at baseline, type 2 diabetes, essential hypertension, peripheral neuropathy, who presents to the ED with a constellation of symptoms including midsternal chest pain, orthopnea, lower extremity edema, and ankle pain. In the ED proBNP 96, high-sensitivity troponin 66 with repeat 74.  Labs and vitals otherwise within normal limits. Chest CTA negative for PE, mild bibasilar atelectasis with mild patchy ground glass opacities in the lung bases bilaterally favoring edema. He was started on IV diuresis and admitted.  Subjective: No acute events overnight.  Patient is complaining of pain in his foot today.  Reports that Percocet is not helping at current dose and is requesting higher dose.  Does endorse that he feels lighter.   Objective: Vitals:   09/04/24 0431 09/04/24 0431 09/04/24 0759 09/04/24 1137  BP: (!) 140/91 (!) 140/91 126/76 115/63  Pulse: 91 91 81 85  Resp: 20 20 18    Temp: 97.9 F (36.6 C) 97.9 F (36.6 C) 97.9 F (36.6 C) 97.9 F (36.6 C)  TempSrc:   Oral Oral  SpO2: 96% 96% 96% 96%  Weight:      Height:        Intake/Output Summary (Last 24 hours) at 09/04/2024 1618 Last data filed at 09/04/2024 1435 Gross per 24 hour  Intake 680 ml  Output 1300 ml  Net -620 ml   Filed Weights   09/01/24 1923  Weight: 136.1 kg    Examination: General exam: Appears calm and comfortable, NAD  Respiratory system: poor Aeration bilaterally, 4 L [place] in place.  Dyspneic when speaking Cardiovascular system: S1 & S2 heard, RRR.  Neuro: Alert and oriented. No focal neurological deficits. Extremities: Bilateral feet and lower legs demonstrates skin thickening, flaking, debris  Assessment  & Plan:  Principal Problem:   Acute on chronic diastolic CHF (congestive heart failure) (HCC) Active Problems:   Left ankle pain   Syncope   Elevated troponin   Essential hypertension   Acute on chronic diastolic CHF - Continue with IV Lasix  - Diuresing well, down 7 L - Appears to be reaching euvolemic state.  Will switch to p.o. diuretics - Continue to follow strict I's and O's, kidney function is tolerating - Echocardiogram revealed known severe left ventricular hypertrophy, no regional wall motion abnormalities, indeterminate diastolic filling. - Given severity of LVH have reached out to [facility] cardiology.  Patient follows with [facility] cardiology outpatient though it has been sometime since he has seen them - Previously was referred to heart failure team but has had difficulty making appointments due to no transportation - Continue with PT/OT evals - Continue to titrate GDMT as tolerated  Elevated troponin - Likely secondary to acute CHF - No evidence of STEMI on EKG - Low threshold to repeat troponin or EKG if pain recurs - Chest pain is reproducible upon palpation - Echo without regional wall motion abnormality.  Cardiology consult as above  CAD with prior PCI - Resume home meds. - Last LHC in 2019 - Does not appear he is currently taking medications, have started aspirin .  Cardiology consultation as well  Syncope - Monitor on telemetry - Orthostatic vitals: Negative  Left ankle pain Prior left Achilles tendon injury - Podiatry has been consulted.  No  bony abnormality on plain film - MRI showing partial tear - Plan for conservative management for now.  Podiatry has ordered equalizer walking boot. - Patient is not a surgical candidate - PT/OT currently recommending SNF, TOC consulted for these arrangements  Hypertension - Resume home meds, resume gradually.  COPD Chronic hypoxic respiratory failure - Currently on baseline O2 - As needed DuoNebs  Type 2  diabetes - A1c 7.6% - Continue sliding scale for now  Rash - Has been seen outpatient by dermatology.  Recommend biopsy if this has not yet been performed outpatient - continue with triamcinolone  Eucerin cream for now  Body mass index is 39.58 kg/m. Obesity Class II - Outpatient follow up for lifestyle modification and risk factor management  Generalized deconditioning - Deconditioning beyond his baseline secondary to new heel pain.  PT/OT recommending SNF, pending TOC arrangements  DVT prophylaxis: Lovenox    Code Status: Full Code Disposition: Needs SNF  Consultants:  Treatment Team:  Consulting Physician: Lennie Barter, DPM  Procedures:    Antimicrobials:  Anti-infectives (From admission, onward)    None       Data Reviewed: I have personally reviewed following labs and imaging studies CBC: Recent Labs  Lab 09/01/24 1407 09/01/24 2006 09/03/24 0436  WBC 6.7 6.5 8.4  HGB 12.4* 11.7* 13.8  HCT 41.3 38.3* 44.7  MCV 97.2 96.7 95.1  PLT 210 207 235   Basic Metabolic Panel: Recent Labs  Lab 09/01/24 1407 09/01/24 2006 09/03/24 0436  NA 140  --  139  K 4.7  --  4.1  CL 96*  --  92*  CO2 40*  --  38*  GLUCOSE 168*  --  155*  BUN 16  --  16  CREATININE 0.91 0.92 0.92  CALCIUM  9.4  --  9.3  MG  --   --  2.2  PHOS  --   --  3.4   GFR: Estimated Creatinine Clearance: 112.9 mL/min (by C-G formula based on SCr of 0.92 mg/dL). Liver Function Tests: Recent Labs  Lab 09/03/24 0436  AST 26  ALT 18  ALKPHOS 75  BILITOT 1.0  PROT 7.3  ALBUMIN 4.0   CBG: Recent Labs  Lab 09/03/24 1154 09/03/24 1700 09/03/24 2027 09/04/24 0757 09/04/24 1137  GLUCAP 210* 169* 153* 161* 210*    No results found for this or any previous visit (from the past 240 hours).   Radiology Studies: ECHOCARDIOGRAM COMPLETE Result Date: 09/03/2024    ECHOCARDIOGRAM REPORT   Patient Name:   Cody Alexander Date of Exam: 09/03/2024 Medical Rec #:  [id number]         Height:        73.0 in Accession #:    [id number]        Weight:       300.0 lb Date of Birth:  1955-08-28         BSA:          2.556 m Patient Age:    68 years          BP:           133/36 mmHg Patient Gender: M                 HR:           81 bpm. Exam Location:  [facility] Procedure: 2D Echo, Cardiac Doppler, Color Doppler and Intracardiac            Opacification Agent (Both Spectral and Color Flow Doppler  were            utilized during procedure). Indications:     Syncope  History:         Patient has prior history of Echocardiogram examinations, most                  recent 01/07/2023. CHF, CAD, COPD; Risk Factors:Hypertension,                  Diabetes, Sleep Apnea and Dyslipidemia.  Sonographer:     Philomena Daring Referring Phys:  [id number] GJW A MANSY Diagnosing Phys: Caron Poser IMPRESSIONS  1. Left ventricular ejection fraction, by estimation, is 60 to 65%. The left ventricle has normal function. The left ventricle has no regional wall motion abnormalities. There is severe concentric left ventricular hypertrophy. Indeterminate diastolic filling due to E-A fusion. No SAM or LVOT gradient visualized.  2. Right ventricular systolic function is normal. The right ventricular size is normal.  3. The mitral valve is grossly normal. No evidence of mitral valve regurgitation. No evidence of mitral stenosis.  4. The aortic valve has an indeterminant number of cusps. Aortic valve regurgitation is not visualized. No aortic stenosis is present.  5. Aortic dilatation noted. There is mild dilatation of the ascending aorta, measuring 41 mm.  6. The inferior vena cava is dilated in size with >50% respiratory variability, suggesting right atrial pressure of 8 mmHg. Comparison(s): A prior study was performed on 01/07/2023. No significant change from prior study. FINDINGS  Left Ventricle: Left ventricular ejection fraction, by estimation, is 60 to 65%. The left ventricle has normal function. The left ventricle has no regional wall motion  abnormalities. Definity  contrast agent was given IV to delineate the left ventricular  endocardial borders. The left ventricular internal cavity size was normal in size. There is severe concentric left ventricular hypertrophy. Indeterminate diastolic filling due to E-A fusion. Right Ventricle: The right ventricular size is normal. Right vetricular wall thickness was not well visualized. Right ventricular systolic function is normal. Left Atrium: Left atrial size was normal in size. Right Atrium: Right atrial size was normal in size. Pericardium: Trivial pericardial effusion is present. Mitral Valve: The mitral valve is grossly normal. No evidence of mitral valve regurgitation. No evidence of mitral valve stenosis. Tricuspid Valve: The tricuspid valve is not well visualized. Tricuspid valve regurgitation is not demonstrated. No evidence of tricuspid stenosis. Aortic Valve: The aortic valve has an indeterminant number of cusps. Aortic valve regurgitation is not visualized. No aortic stenosis is present. Pulmonic Valve: The pulmonic valve was not well visualized. Pulmonic valve regurgitation is not visualized. No evidence of pulmonic stenosis. Aorta: The aortic root is normal in size and structure and aortic dilatation noted. There is mild dilatation of the ascending aorta, measuring 41 mm. Venous: The inferior vena cava is dilated in size with greater than 50% respiratory variability, suggesting right atrial pressure of 8 mmHg. IAS/Shunts: The interatrial septum was not well visualized.  LEFT VENTRICLE PLAX 2D LVIDd:         4.50 cm   Diastology LVIDs:         2.80 cm   LV e' medial:    6.20 cm/s LV PW:         1.50 cm   LV E/e' medial:  12.9 LV IVS:        1.80 cm   LV e' lateral:   5.00 cm/s LVOT diam:     2.10 cm   LV E/e'  lateral: 16.0 LV SV:         76 LV SV Index:   30 LVOT Area:     3.46 cm  RIGHT VENTRICLE             IVC RV Basal diam:  3.80 cm     IVC diam: 2.40 cm RV Mid diam:    2.50 cm RV S prime:      18.20 cm/s TAPSE (M-mode): 2.6 cm LEFT ATRIUM             Index        RIGHT ATRIUM           Index LA diam:        3.40 cm 1.33 cm/m   RA Area:     19.70 cm LA Vol (A2C):   86.8 ml 33.96 ml/m  RA Volume:   51.80 ml  20.26 ml/m LA Vol (A4C):   49.7 ml 19.44 ml/m LA Biplane Vol: 66.1 ml 25.86 ml/m  AORTIC VALVE LVOT Vmax:   134.00 cm/s LVOT Vmean:  83.600 cm/s LVOT VTI:    0.218 m  AORTA Ao Root diam: 3.40 cm MITRAL VALVE               TRICUSPID VALVE MV Area (PHT): 3.89 cm    TR Peak grad:   8.5 mmHg MV Decel Time: 195 msec    TR Vmax:        146.00 cm/s MV E velocity: 79.90 cm/s MV A velocity: 85.90 cm/s  SHUNTS MV E/A ratio:  0.93        Systemic VTI:  0.22 m                            Systemic Diam: 2.10 cm Caron Poser Electronically signed by Caron Poser Signature Date/Time: 09/03/2024/9:04:22 AM    Final    MR ANKLE LEFT WO CONTRAST Result Date: 09/03/2024 CLINICAL DATA:  Achilles tendon pain. History of prior Achilles repair over 30 years ago. EXAM: MRI OF THE LEFT ANKLE WITHOUT CONTRAST TECHNIQUE: Multiplanar, multisequence MR imaging of the ankle was performed. No intravenous contrast was administered. COMPARISON:  Left foot radiographs dated 09/02/2024. FINDINGS: Peroneal: Peroneal longus tendon intact. Peroneal brevis intact. Posteromedial: Posterior tibial tendon intact. Flexor hallucis longus tendon intact. Flexor digitorum longus tendon intact. Anterior: Tibialis anterior tendon intact. Extensor hallucis longus tendon intact Extensor digitorum longus tendon intact. Achilles: Fusiform thickening of the distal Achilles tendon with irregular curvilinear intrasubstance T2 hyperintense signal extending for a length of approximately 4.7 cm through the center of the thickened distal Achilles tendon with possible extension through the posterior superficial fibers, concerning for partial-thickness tear with tendinosis. The distal margin of the partial-thickness tear is 4.5 cm proximal to the  calcaneal insertion of the Achilles tendon. The calcaneal insertion of the Achilles tendon is intact without discrete tear. No evidence of tendon retraction. Plantar Fascia: Intact. LIGAMENTS Lateral: Anterior talofibular ligament intact. Calcaneofibular ligament intact. Posterior talofibular ligament intact. Anterior and posterior tibiofibular ligaments intact. Medial: Deltoid ligament intact. Spring ligament intact. CARTILAGE Ankle Joint: No joint effusion. Normal ankle mortise. No chondral defect. Subtalar Joints/Sinus Tarsi: Normal subtalar joints. No subtalar joint effusion. Normal sinus tarsi. Bones: No aggressive osseous lesion. No fracture or dislocation. Mild joint space narrowing of the talonavicular, naviculocuneiform, and midfoot articulations. Soft Tissue: Diffuse subcutaneous edema of the ankle. No loculated fluid collection. Generalized atrophy of the musculature. IMPRESSION: 1. Findings most  compatible with partial-thickness tear of the distal Achilles tendon extending for a length of approximately 4.7 cm with the distal margin of the partial-thickness tear 4.5 cm proximal to the calcaneal insertion. The calcaneal insertion of the Achilles tendon is intact without discrete tear. Associated distal Achilles tendinosis. 2. Diffuse subcutaneous edema of the ankle. Electronically Signed   By: Harrietta Sherry M.D.   On: 09/03/2024 08:24    Scheduled Meds:  aspirin   81 mg Oral Daily   atorvastatin   80 mg Oral Daily   enoxaparin  (LOVENOX ) injection  65 mg Subcutaneous Q24H   furosemide   40 mg Intravenous Q12H   insulin  aspart  0-15 Units Subcutaneous TID WC   insulin  aspart  0-5 Units Subcutaneous QHS   losartan   25 mg Oral Daily   spironolactone   25 mg Oral q morning   triamcinolone  0.1 % cream : eucerin   Topical TID   Continuous Infusions:   LOS: 3 days  MDM: Patient is high risk for one or more organ failure.  They necessitate ongoing hospitalization for continued IV therapies and  subsequent lab monitoring. Total time spent interpreting labs and vitals, reviewing the medical record, coordinating care amongst consultants and care team members, directly assessing and discussing care with the patient and/or family: 55 min  Kennedi Lizardo, DO [facility]  To contact the attending physician between 7A-7P please use Epic Chat. To contact the covering physician during after hours 7P-7A, please review Amion.  09/04/2024, 4:18 PM   *This document has been created with the assistance of dictation software. Please excuse typographical errors. *   "

## 2024-09-05 ENCOUNTER — Inpatient Hospital Stay

## 2024-09-05 DIAGNOSIS — J9601 Acute respiratory failure with hypoxia: Secondary | ICD-10-CM | POA: Diagnosis not present

## 2024-09-05 DIAGNOSIS — Z7189 Other specified counseling: Secondary | ICD-10-CM

## 2024-09-05 DIAGNOSIS — J9602 Acute respiratory failure with hypercapnia: Secondary | ICD-10-CM

## 2024-09-05 DIAGNOSIS — Z515 Encounter for palliative care: Secondary | ICD-10-CM

## 2024-09-05 DIAGNOSIS — I110 Hypertensive heart disease with heart failure: Principal | ICD-10-CM

## 2024-09-05 DIAGNOSIS — J441 Chronic obstructive pulmonary disease with (acute) exacerbation: Secondary | ICD-10-CM

## 2024-09-05 DIAGNOSIS — I5033 Acute on chronic diastolic (congestive) heart failure: Secondary | ICD-10-CM | POA: Diagnosis not present

## 2024-09-05 LAB — COMPREHENSIVE METABOLIC PANEL WITH GFR
ALT: 21 U/L (ref 0–44)
ALT: 19 U/L (ref 0–44)
AST: 22 U/L (ref 15–41)
AST: 20 U/L (ref 15–41)
Albumin: 4.6 g/dL (ref 3.5–5.0)
Albumin: 4.2 g/dL (ref 3.5–5.0)
Alkaline Phosphatase: 88 U/L (ref 38–126)
Alkaline Phosphatase: 72 U/L (ref 38–126)
Anion gap: 9 (ref 5–15)
Anion gap: 9 (ref 5–15)
BUN: 29 mg/dL — ABNORMAL HIGH (ref 8–23)
BUN: 36 mg/dL — ABNORMAL HIGH (ref 8–23)
CO2: 44 mmol/L — ABNORMAL HIGH (ref 22–32)
CO2: 44 mmol/L — ABNORMAL HIGH (ref 22–32)
Calcium: 9.3 mg/dL (ref 8.9–10.3)
Calcium: 9.4 mg/dL (ref 8.9–10.3)
Chloride: 88 mmol/L — ABNORMAL LOW (ref 98–111)
Chloride: 89 mmol/L — ABNORMAL LOW (ref 98–111)
Creatinine, Ser: 1.45 mg/dL — ABNORMAL HIGH (ref 0.61–1.24)
Creatinine, Ser: 1.54 mg/dL — ABNORMAL HIGH (ref 0.61–1.24)
GFR, Estimated: 53 mL/min — ABNORMAL LOW
GFR, Estimated: 49 mL/min — ABNORMAL LOW
Glucose, Bld: 279 mg/dL — ABNORMAL HIGH (ref 70–99)
Glucose, Bld: 180 mg/dL — ABNORMAL HIGH (ref 70–99)
Potassium: 4.7 mmol/L (ref 3.5–5.1)
Potassium: 4.3 mmol/L (ref 3.5–5.1)
Sodium: 141 mmol/L (ref 135–145)
Sodium: 142 mmol/L (ref 135–145)
Total Bilirubin: 0.7 mg/dL (ref 0.0–1.2)
Total Bilirubin: 0.6 mg/dL (ref 0.0–1.2)
Total Protein: 8.2 g/dL — ABNORMAL HIGH (ref 6.5–8.1)
Total Protein: 7.1 g/dL (ref 6.5–8.1)

## 2024-09-05 LAB — CBC
HCT: 42.9 % (ref 39.0–52.0)
Hemoglobin: 13.3 g/dL (ref 13.0–17.0)
MCH: 29.5 pg (ref 26.0–34.0)
MCHC: 31 g/dL (ref 30.0–36.0)
MCV: 95.1 fL (ref 80.0–100.0)
Platelets: 248 K/uL (ref 150–400)
RBC: 4.51 MIL/uL (ref 4.22–5.81)
RDW: 12.8 % (ref 11.5–15.5)
WBC: 9.5 K/uL (ref 4.0–10.5)
nRBC: 0 % (ref 0.0–0.2)

## 2024-09-05 LAB — GLUCOSE, CAPILLARY
Glucose-Capillary: 258 mg/dL — ABNORMAL HIGH (ref 70–99)
Glucose-Capillary: 265 mg/dL — ABNORMAL HIGH (ref 70–99)
Glucose-Capillary: 268 mg/dL — ABNORMAL HIGH (ref 70–99)
Glucose-Capillary: 251 mg/dL — ABNORMAL HIGH (ref 70–99)
Glucose-Capillary: 162 mg/dL — ABNORMAL HIGH (ref 70–99)
Glucose-Capillary: 176 mg/dL — ABNORMAL HIGH (ref 70–99)
Glucose-Capillary: 141 mg/dL — ABNORMAL HIGH (ref 70–99)
Glucose-Capillary: 152 mg/dL — ABNORMAL HIGH (ref 70–99)

## 2024-09-05 LAB — TROPONIN T, HIGH SENSITIVITY
Troponin T High Sensitivity: 63 ng/L — ABNORMAL HIGH (ref 0–19)
Troponin T High Sensitivity: 63 ng/L — ABNORMAL HIGH (ref 0–19)

## 2024-09-05 LAB — BLOOD GAS, ARTERIAL
Acid-Base Excess: 20.4 mmol/L — ABNORMAL HIGH (ref 0.0–2.0)
Acid-Base Excess: 28.3 mmol/L — ABNORMAL HIGH (ref 0.0–2.0)
Bicarbonate: 54.4 mmol/L — ABNORMAL HIGH (ref 20.0–28.0)
Bicarbonate: 60.3 mmol/L — ABNORMAL HIGH (ref 20.0–28.0)
Delivery systems: POSITIVE
Delivery systems: POSITIVE
Expiratory PAP: 10 cmH2O
Expiratory PAP: 10 cmH2O
FIO2: 55 %
FIO2: 60 %
Inspiratory PAP: 22 cmH2O
Inspiratory PAP: 26 cmH2O
O2 Saturation: 85.9 %
O2 Saturation: 93.1 %
O2 Saturation: 94.6 %
Patient temperature: 37
Patient temperature: 37
Patient temperature: 37
pCO2 arterial: >123 mmHg (ref 32–48)
pCO2 arterial: >123 mmHg (ref 32–48)
pCO2 arterial: 102 mmHg (ref 32–48)
pH, Arterial: 7.16 — CL (ref 7.35–7.45)
pH, Arterial: 7.25 — ABNORMAL LOW (ref 7.35–7.45)
pH, Arterial: 7.38 (ref 7.35–7.45)
pO2, Arterial: 57 mmHg — ABNORMAL LOW (ref 83–108)
pO2, Arterial: 66 mmHg — ABNORMAL LOW (ref 83–108)
pO2, Arterial: 67 mmHg — ABNORMAL LOW (ref 83–108)

## 2024-09-05 LAB — CBC WITH DIFFERENTIAL/PLATELET
Abs Immature Granulocytes: 0.04 K/uL (ref 0.00–0.07)
Basophils Absolute: 0 K/uL (ref 0.0–0.1)
Basophils Relative: 0 %
Eosinophils Absolute: 0 K/uL (ref 0.0–0.5)
Eosinophils Relative: 0 %
HCT: 47.6 % (ref 39.0–52.0)
Hemoglobin: 14.1 g/dL (ref 13.0–17.0)
Immature Granulocytes: 0 %
Lymphocytes Relative: 6 %
Lymphs Abs: 0.7 K/uL (ref 0.7–4.0)
MCH: 29.4 pg (ref 26.0–34.0)
MCHC: 29.6 g/dL — ABNORMAL LOW (ref 30.0–36.0)
MCV: 99.2 fL (ref 80.0–100.0)
Monocytes Absolute: 0.7 K/uL (ref 0.1–1.0)
Monocytes Relative: 7 %
Neutro Abs: 9.1 K/uL — ABNORMAL HIGH (ref 1.7–7.7)
Neutrophils Relative %: 87 %
Platelets: 237 K/uL (ref 150–400)
RBC: 4.8 MIL/uL (ref 4.22–5.81)
RDW: 12.9 % (ref 11.5–15.5)
WBC: 10.5 K/uL (ref 4.0–10.5)
nRBC: 0 % (ref 0.0–0.2)

## 2024-09-05 LAB — LACTIC ACID, PLASMA: Lactic Acid, Venous: 1.4 mmol/L (ref 0.5–1.9)

## 2024-09-05 LAB — PROTIME-INR
INR: 0.9 (ref 0.8–1.2)
Prothrombin Time: 13.1 s (ref 11.4–15.2)

## 2024-09-05 LAB — MAGNESIUM: Magnesium: 2.1 mg/dL (ref 1.7–2.4)

## 2024-09-05 LAB — RESP PANEL BY RT-PCR (RSV, FLU A&B, COVID)  RVPGX2
Influenza A by PCR: NEGATIVE
Influenza B by PCR: NEGATIVE
Resp Syncytial Virus by PCR: NEGATIVE
SARS Coronavirus 2 by RT PCR: NEGATIVE

## 2024-09-05 LAB — MRSA NEXT GEN BY PCR, NASAL: MRSA by PCR Next Gen: NOT DETECTED

## 2024-09-05 LAB — PHOSPHORUS: Phosphorus: 3.4 mg/dL (ref 2.5–4.6)

## 2024-09-05 MED ORDER — FUROSEMIDE 10 MG/ML IJ SOLN
40.0000 mg | Freq: Once | INTRAMUSCULAR | Status: AC
  Administered 2024-09-05: 40 mg via INTRAVENOUS
  Filled 2024-09-05: qty 4

## 2024-09-05 MED ORDER — DIAZEPAM 5 MG/ML IJ SOLN
INTRAMUSCULAR | Status: AC
  Filled 2024-09-05: qty 2

## 2024-09-05 MED ORDER — ORAL CARE MOUTH RINSE
15.0000 mL | OROMUCOSAL | Status: DC
  Administered 2024-09-05 – 2024-09-09 (×12): 15 mL via OROMUCOSAL

## 2024-09-05 MED ORDER — METHYLPREDNISOLONE SODIUM SUCC 40 MG IJ SOLR
40.0000 mg | Freq: Two times a day (BID) | INTRAMUSCULAR | Status: DC
  Administered 2024-09-05 – 2024-09-07 (×4): 40 mg via INTRAVENOUS
  Filled 2024-09-05 (×4): qty 1

## 2024-09-05 MED ORDER — IPRATROPIUM-ALBUTEROL 0.5-2.5 (3) MG/3ML IN SOLN
3.0000 mL | Freq: Four times a day (QID) | RESPIRATORY_TRACT | Status: DC
  Administered 2024-09-05 – 2024-09-08 (×13): 3 mL via RESPIRATORY_TRACT
  Filled 2024-09-05 (×13): qty 3

## 2024-09-05 MED ORDER — DIAZEPAM 5 MG/ML IJ SOLN
2.5000 mg | Freq: Once | INTRAMUSCULAR | Status: AC
  Administered 2024-09-05: 2.5 mg via INTRAVENOUS

## 2024-09-05 MED ORDER — ACETAZOLAMIDE SODIUM 500 MG IJ SOLR
500.0000 mg | Freq: Once | INTRAMUSCULAR | Status: AC
  Administered 2024-09-05: 500 mg via INTRAVENOUS
  Filled 2024-09-05: qty 500

## 2024-09-05 MED ORDER — STERILE WATER FOR INJECTION IJ SOLN
INTRAMUSCULAR | Status: AC
  Administered 2024-09-05: 10 mL
  Filled 2024-09-05: qty 10

## 2024-09-05 MED ORDER — ORAL CARE MOUTH RINSE: 15.0000 mL | OROMUCOSAL | Status: DC | PRN

## 2024-09-05 MED ORDER — INSULIN ASPART 100 UNIT/ML IJ SOLN
0.0000 [IU] | INTRAMUSCULAR | Status: DC
  Administered 2024-09-05: 4 [IU] via SUBCUTANEOUS
  Administered 2024-09-05: 11 [IU] via SUBCUTANEOUS
  Administered 2024-09-05: 3 [IU] via SUBCUTANEOUS
  Administered 2024-09-06: 11 [IU] via SUBCUTANEOUS
  Administered 2024-09-06: 7 [IU] via SUBCUTANEOUS
  Administered 2024-09-06: 4 [IU] via SUBCUTANEOUS
  Administered 2024-09-06: 3 [IU] via SUBCUTANEOUS
  Administered 2024-09-06 (×2): 4 [IU] via SUBCUTANEOUS
  Administered 2024-09-07: 11 [IU] via SUBCUTANEOUS
  Administered 2024-09-07 (×2): 7 [IU] via SUBCUTANEOUS
  Administered 2024-09-07: 4 [IU] via SUBCUTANEOUS
  Administered 2024-09-07: 7 [IU] via SUBCUTANEOUS
  Administered 2024-09-07: 11 [IU] via SUBCUTANEOUS
  Administered 2024-09-08 (×2): 15 [IU] via SUBCUTANEOUS
  Administered 2024-09-08: 4 [IU] via SUBCUTANEOUS
  Administered 2024-09-08 (×2): 7 [IU] via SUBCUTANEOUS
  Administered 2024-09-09 (×2): 11 [IU] via SUBCUTANEOUS
  Administered 2024-09-09: 4 [IU] via SUBCUTANEOUS
  Filled 2024-09-05: qty 7
  Filled 2024-09-05: qty 18
  Filled 2024-09-05: qty 4
  Filled 2024-09-05 (×2): qty 7
  Filled 2024-09-05: qty 4
  Filled 2024-09-05: qty 11
  Filled 2024-09-05: qty 7
  Filled 2024-09-05: qty 4
  Filled 2024-09-05: qty 11
  Filled 2024-09-05: qty 7
  Filled 2024-09-05: qty 3
  Filled 2024-09-05: qty 4
  Filled 2024-09-05: qty 11
  Filled 2024-09-05: qty 4
  Filled 2024-09-05: qty 3
  Filled 2024-09-05: qty 7
  Filled 2024-09-05: qty 3
  Filled 2024-09-05 (×2): qty 4
  Filled 2024-09-05 (×2): qty 11
  Filled 2024-09-05: qty 15

## 2024-09-05 MED ORDER — METHYLPREDNISOLONE SODIUM SUCC 40 MG IJ SOLR: 40.0000 mg | Freq: Two times a day (BID) | INTRAMUSCULAR | Status: DC

## 2024-09-05 MED ORDER — LORAZEPAM 2 MG/ML IJ SOLN
1.0000 mg | INTRAMUSCULAR | Status: DC | PRN
  Administered 2024-09-05: 1 mg via INTRAVENOUS
  Filled 2024-09-05 (×2): qty 1

## 2024-09-05 MED ORDER — METHYLPREDNISOLONE SODIUM SUCC 125 MG IJ SOLR
125.0000 mg | Freq: Once | INTRAMUSCULAR | Status: AC
  Administered 2024-09-05: 125 mg via INTRAVENOUS
  Filled 2024-09-05: qty 2

## 2024-09-05 MED ORDER — CHLORHEXIDINE GLUCONATE CLOTH 2 % EX PADS
6.0000 | MEDICATED_PAD | Freq: Every day | CUTANEOUS | Status: DC
  Administered 2024-09-05 – 2024-09-06 (×2): 6 via TOPICAL

## 2024-09-05 NOTE — Progress Notes (Signed)
 Patient exhibits improved alertness with RN, MD and NP at bedside. States he feels like he is having heart attack. 12 lead performed with MD at bedside and read. Patient anxious. New order for Valium  2.5 mg placed by NP and administered by this RN. Patient requested to be visited by Chaplain. NS paged Chaplain and currently at bedside conversing with patient. New lab orders placed for troponin trending by NP. Patient states he feels better currently and requesting additional blankets. All of patient's expressed needs met at this time. Will continue to monitor closely.

## 2024-09-05 NOTE — Consult Note (Signed)
 "  NAME:  Cody Alexander, MRN:  [id number], DOB:  [birth year], LOS: 4 ADMISSION DATE:  09/01/2024 CHIEF COMPLAINT:  RESP FAILURE    History of Present Illness:  69 y.o. male with medical history significant for diastolic CHF, COPD, type 2 diabetes mellitus, essential hypertension and peripheral neuropathy, who presented to the emergency room with acute onset of midsternal chest pain graded 8/10 in severity with no nausea or vomiting or diaphoresis or radiation however with associated dyspnea as well as dry cough and wheezing.    He admitted to recent orthopnea and paroxysmal extreme dyspnea with worsening lower extremity edema.   subsequent fall   The symptoms have been worsening over the last couple weeks.  No fever or chills.  No dysuria, oliguria or hematuria or flank pain.  He is on 3 L of O2 by nasal cannula at baseline all the time.  He has been having worse left ankle pain and believes that he may have a left Achilles tendon tear.  At the same tendon repair about 40 years ago.  ED Course: ER BP was 155/78 with respiratory rate of 21 and otherwise normal vital signs.  Labs revealed a CO2 40 and chloride 96 and blood glucose of 168.  proBNP was 896 and high-sensitivity t troponin T was 66 and later 74.  CBC showed hemoglobin 11.7 hematocrit 30.3 close to previous levels.    Significant Hospital Events: Including procedures, antibiotic start and stop dates in addition to other pertinent events   1/7 admitted to [facility] and chest pain 1/11 admitted to ICU for severe Hypercapnic resp failure, placed on bIPAP      Micro Data:  FLU/RSV/COVID PENDING  Antimicrobials:   Antibiotics Given (last 72 hours)     None            Interim History / Subjective:  Critically Ill morbidly obese Lethargic on biPAP High risk for intubation and cardiac arrest      Objective   Blood pressure 125/74, pulse 89, temperature (!) 97.4 F (36.3 C), temperature source Axillary, resp. rate 14,  height 6' 1 (1.854 m), weight 130.4 kg, SpO2 98%.    FiO2 (%):  [32 %] 32 % PEEP:  [5 cmH20] 5 cmH20 Pressure Support:  [17 cmH20] 17 cmH20   Intake/Output Summary (Last 24 hours) at 09/05/2024 1021 Last data filed at 09/05/2024 0549 Gross per 24 hour  Intake 720 ml  Output 925 ml  Net -205 ml   Filed Weights   09/01/24 1923 09/05/24 0955  Weight: 136.1 kg 130.4 kg    REVIEW OF SYSTEMS  PATIENT IS UNABLE TO PROVIDE COMPLETE REVIEW OF SYSTEMS DUE TO SEVERE CRITICAL ILLNESS   PHYSICAL EXAMINATION:  GENERAL:critically ill appearing, +resp distress MOUTH: Moist mucosal membrane PULMONARY: Lungs clear to auscultation, +rhonchi CARDIOVASCULAR: S1 and S2.  Regular rate and rhythm GASTROINTESTINAL: Soft, nontender, +distended. Positive bowel sounds.  MUSCULOSKELETAL: edema.  NEUROLOGIC: obtunded,sedated SKIN:normal, warm to touch, Capillary refill delayed  Pulses present bilaterally 1 raided subQ lesions on his back, likely lipomas   Labs/imaging that I havepersonally reviewed  (right click and Reselect all SmartList Selections daily)       ASSESSMENT AND PLAN SYNOPSIS  69 yo morbidly obese with male with dCHF with acute and severe resp acidosis with end stage COPD    Severe ACUTE Hypoxic and Hypercapnic Respiratory Failure High risk for intubation and cardiac arrest Continue biPAP for now Start DIAMOX  therapy  FiO2 (%):  [32 %] 32 % PEEP:  [  5 cmH20] 5 cmH20 Pressure Support:  [17 cmH20] 17 cmH20   SEVERE COPD EXACERBATION -continue IV steroids as prescribed -continue NEB THERAPY as prescribed  CARDIAC FAILURE-acute diastolic dysfunction -oxygen as needed -Lasix  stopped due to contraction alkalosis  CARDIAC ICU monitoring   ACUTE KIDNEY INJURY/Renal Failure -continue Foley Catheter-assess need -Avoid nephrotoxic agents -Follow urine output, BMP -Ensure adequate renal perfusion, optimize oxygenation -Renal dose medications   Intake/Output Summary  (Last 24 hours) at 09/05/2024 1021 Last data filed at 09/05/2024 0549 Gross per 24 hour  Intake 720 ml  Output 925 ml  Net -205 ml      Latest Ref Rng & Units 09/05/2024    9:28 AM 09/03/2024    4:36 AM 09/01/2024    8:06 PM  BMP  Glucose 70 - 99 mg/dL 720  844    BUN 8 - 23 mg/dL 29  16    Creatinine 9.38 - 1.24 mg/dL 8.54  9.07  9.07   Sodium 135 - 145 mmol/L 141  139    Potassium 3.5 - 5.1 mmol/L 4.7  4.1    Chloride 98 - 111 mmol/L 88  92    CO2 22 - 32 mmol/L 44  38    Calcium  8.9 - 10.3 mg/dL 9.3  9.3        NEUROLOGY Acute  metabolic encephalopathy   ENDO - ICU hypoglycemic\Hyperglycemia protocol -check FSBS per protocol   GI GI PROPHYLAXIS as indicated  NUTRITIONAL STATUS DIET-->NPO Constipation protocol as indicated   ELECTROLYTES -follow labs as needed -replace as needed -pharmacy consultation and following     Best practice (right click and Reselect all SmartList Selections daily)  Diet:  NPO Code Status:  FULL CODE Disposition: ICU  Labs   CBC: Recent Labs  Lab 09/01/24 1407 09/01/24 2006 09/03/24 0436 09/05/24 0928  WBC 6.7 6.5 8.4 10.5  NEUTROABS  --   --   --  9.1*  HGB 12.4* 11.7* 13.8 14.1  HCT 41.3 38.3* 44.7 47.6  MCV 97.2 96.7 95.1 99.2  PLT 210 207 235 237    Basic Metabolic Panel: Recent Labs  Lab 09/01/24 1407 09/01/24 2006 09/03/24 0436 09/05/24 0928  NA 140  --  139 141  K 4.7  --  4.1 4.7  CL 96*  --  92* 88*  CO2 40*  --  38* 44*  GLUCOSE 168*  --  155* 279*  BUN 16  --  16 29*  CREATININE 0.91 0.92 0.92 1.45*  CALCIUM  9.4  --  9.3 9.3  MG  --   --  2.2  --   PHOS  --   --  3.4  --    GFR: Estimated Creatinine Clearance: 70 mL/min (A) (by C-G formula based on SCr of 1.45 mg/dL (H)). Recent Labs  Lab 09/01/24 1407 09/01/24 2006 09/03/24 0436 09/05/24 0928  WBC 6.7 6.5 8.4 10.5    Liver Function Tests: Recent Labs  Lab 09/03/24 0436 09/05/24 0928  AST 26 22  ALT 18 21  ALKPHOS 75 88   BILITOT 1.0 0.7  PROT 7.3 8.2*  ALBUMIN 4.0 4.6   No results for input(s): LIPASE, AMYLASE in the last 168 hours. No results for input(s): AMMONIA in the last 168 hours.  ABG    Component Value Date/Time   PHART 7.16 (LL) 09/05/2024 0841   PCO2ART >123 (HH) 09/05/2024 0841   PO2ART 57 (L) 09/05/2024 0841   HCO3 NOT CALCULATED 09/05/2024 0841   O2SAT 85.9 09/05/2024  9158     Coagulation Profile: No results for input(s): INR, PROTIME in the last 168 hours.  Cardiac Enzymes: No results for input(s): CKTOTAL, CKMB, CKMBINDEX, TROPONINI in the last 168 hours.  HbA1C: Hemoglobin A1C  Date/Time Value Ref Range Status  08/12/2013 04:01 AM 6.9 (H) 4.2 - 6.3 % Final    Comment:    The American Diabetes Association recommends that a primary goal of therapy should be <7% and that physicians should reevaluate the treatment regimen in patients with HbA1c values consistently >8%.    Hgb A1c MFr Bld  Date/Time Value Ref Range Status  09/01/2024 02:07 PM 7.6 (H) 4.8 - 5.6 % Final    Comment:    (NOTE) Diagnosis of Diabetes The following HbA1c ranges recommended by the American Diabetes Association (ADA) may be used as an aid in the diagnosis of diabetes mellitus.  Hemoglobin             Suggested A1C NGSP%              Diagnosis  <5.7                   Non Diabetic  5.7-6.4                Pre-Diabetic  >6.4                   Diabetic  <7.0                   Glycemic control for                       adults with diabetes.    07/23/2023 06:25 AM 10.5 (H) 4.8 - 5.6 % Final    Comment:    (NOTE) Pre diabetes:          5.7%-6.4%  Diabetes:              >6.4%  Glycemic control for   <7.0% adults with diabetes     CBG: Recent Labs  Lab 09/04/24 1625 09/04/24 2115 09/05/24 0514 09/05/24 0909 09/05/24 0946  GLUCAP 213* 206* 258* 265* 268*    Allergies Allergies[1]     DVT/GI PRX  assessed I Assessed the need for Labs I Assessed the need  for Foley I Assessed the need for Central Venous Line Family Discussion when available I Assessed the need for Mobilization I made an Assessment of medications to be adjusted accordingly Safety Risk assessment completed  CASE DISCUSSED IN MULTIDISCIPLINARY ROUNDS WITH ICU TEAM     Critical Care Time devoted to patient care services described in this note is 85 minutes.  Critical care was necessary to treat or prevent imminent or life-threatening deterioration.   PATIENT WITH VERY POOR PROGNOSIS I ANTICIPATE PROLONGED ICU LOS  Patient with Multiorgan failure and at high risk for cardiac arrest and death.    Nickolas Alm Cellar, M.D.  [facility]  Medical Director [facility]           [1]  Allergies Allergen Reactions   Erythromycin Anaphylaxis and Swelling    Had eye swelling with erythromycin during a time when he had perf ear drum.  Tolerated azithromycin .   "

## 2024-09-05 NOTE — Progress Notes (Signed)
 Per RT: CO2 102 and pH 7.38 on most recent ABG. MD Kasa notified by this RN of ABG improvement.

## 2024-09-05 NOTE — Progress Notes (Signed)
 [facility]    SUBJECTIVE: Patient complains of some shortness of breath and dyspnea patient currently on BiPAP because of respiratory distress and failure.   Vitals:   09/05/24 1800 09/05/24 1810 09/05/24 1900 09/05/24 2000  BP: 116/80  113/72 108/68  Pulse: 77  84 80  Resp: 14  17 19   Temp:      TempSrc:      SpO2: 92% 91% 94% 94%  Weight:      Height:         Intake/Output Summary (Last 24 hours) at 09/05/2024 2007 Last data filed at 09/05/2024 1600 Gross per 24 hour  Intake --  Output 1050 ml  Net -1050 ml      PHYSICAL EXAM  General: Well developed, well nourished, in no acute distress HEENT:  Normocephalic and atramatic Neck:  No JVD.  Lungs: Clear bilaterally to auscultation and percussion. Heart: HRRR . Normal S1 and S2 without gallops or murmurs.  Abdomen: Bowel sounds are positive, abdomen soft and non-tender  Msk:  Back normal, normal gait. Normal strength and tone for age. Extremities: No clubbing, cyanosis or edema.   Neuro: Alert and oriented X 3. Psych:  Good affect, responds appropriately   LABS: Basic Metabolic Panel: Recent Labs    09/03/24 0436 09/05/24 0928  NA 139 141  K 4.1 4.7  CL 92* 88*  CO2 38* 44*  GLUCOSE 155* 279*  BUN 16 29*  CREATININE 0.92 1.45*  CALCIUM  9.3 9.3  MG 2.2  --   PHOS 3.4  --    Liver Function Tests: Recent Labs    09/03/24 0436 09/05/24 0928  AST 26 22  ALT 18 21  ALKPHOS 75 88  BILITOT 1.0 0.7  PROT 7.3 8.2*  ALBUMIN 4.0 4.6   No results for input(s): LIPASE, AMYLASE in the last 72 hours. CBC: Recent Labs    09/03/24 0436 09/05/24 0928  WBC 8.4 10.5  NEUTROABS  --  9.1*  HGB 13.8 14.1  HCT 44.7 47.6  MCV 95.1 99.2  PLT 235 237   Cardiac Enzymes: No results for input(s): CKTOTAL, CKMB, CKMBINDEX, TROPONINI in the last 72 hours. BNP: Invalid input(s): POCBNP D-Dimer: No results for input(s): DDIMER in the last 72 hours. Hemoglobin A1C: No results for input(s):  HGBA1C in the last 72 hours. Fasting Lipid Panel: No results for input(s): CHOL, HDL, LDLCALC, TRIG, CHOLHDL, LDLDIRECT in the last 72 hours. Thyroid Function Tests: No results for input(s): TSH, T4TOTAL, T3FREE, THYROIDAB in the last 72 hours.  Invalid input(s): FREET3 Anemia Panel: No results for input(s): VITAMINB12, FOLATE, FERRITIN, TIBC, IRON, RETICCTPCT in the last 72 hours.  DG Chest Port 1 View Result Date: 09/05/2024 EXAM: 1 VIEW XRAY OF THE CHEST 09/05/2024 09:17:00 AM COMPARISON: 09/01/2024 CLINICAL HISTORY: 69 year old male with hypercapnic respiratory failure (hepatocellular carcinoma). FINDINGS: LUNGS AND PLEURA: Low lung volumes. Linear opacity in left mid lung, and patchy left greater than right lung bases, stable compared to prior exam. No pleural effusion. No pneumothorax. HEART AND MEDIASTINUM: Cardiomegaly, stable. Aortic atherosclerosis. BONES AND SOFT TISSUES: No acute osseous abnormality. IMPRESSION: 1. Ongoing low lung volumes, atelectasis. Electronically signed by: Helayne Hurst MD MD 09/05/2024 09:28 AM EST RP Workstation: [id number]     Echo preserved left ventricular function EF of 50%  TELEMETRY: Sinus rhythm rate of 90 nonspecific ECG changes:  ASSESSMENT AND PLAN:  Principal Problem:   Acute on chronic diastolic CHF (congestive heart failure) (HCC) Active Problems:   Essential hypertension   Elevated  troponin   Left ankle pain   Syncope    Plan Acute on chronic respiratory failure with COPD heart failure Diastolic congestive heart failure Obstructive sleep apnea recommend sleep study CPAP weight loss Hypertension reasonably controlled continue current management Inhalers as necessary for COPD type symptoms Obesity recommend modest weight loss exercise portion control CPAP BiPAP for dyspnea shortness of breath , Consider ischemic workup functional study versus cardiac cath   Cara JONETTA Lovelace,  MD, 09/05/2024 8:07 PM

## 2024-09-05 NOTE — Progress Notes (Signed)
 Patient awake at 0400 with incomprehensible speech and involuntary tremors.  Dr. Lawence was contacted and an ABG was ordered.  A critical pCO2 123 was relayed to the primary RN along with abnormal pH 7.21 and Bicarb 57.6.  Awaiting orders from the Hospitalist.

## 2024-09-05 NOTE — Progress Notes (Signed)
" °  Chaplain On-Call responded to a page at 1731 hours from [employer] who reported the patient's request to speak with the Chaplain.  Chaplain met the patient and joined with him to pray the Lord's Prayer at his request. Chaplain provided spiritual and emotional support for the patient, who was coping with anxiety about his medical condition. Chaplain offered reassurance and comfort.  Chaplain Bebe Ardean EMERSON Hershal., BCC "

## 2024-09-05 NOTE — Progress Notes (Signed)
 Pt transferred to ICU on V60 on the documented settings.  No issues or concerns during transport

## 2024-09-05 NOTE — Progress Notes (Signed)
 PT Cancellation Note  Patient Details Name: Cody Alexander MRN: [id number] DOB: 1956-05-29   Cancelled Treatment:     Significant decline in medical status on 09/05/24 RR Called, pt transferred to ICU-10. Please re-consult PT when medically appropriate.    Darice [name] 09/05/2024, 12:45 PM

## 2024-09-05 NOTE — Progress Notes (Signed)
 While in the room the patient is having harsh jerking of upper extremities. He states he does not have this at home. His CIWA score is a 17. Patient was unable to pass a swallow evaluation. He was able to swallow a first drink then with the second he was unable to hold it in his mouth and had to let it fall out of his mouth followed by 2 coughs.

## 2024-09-05 NOTE — Consult Note (Signed)
 "                                   Consultation Note Date: 09/05/2024   [name]  DOB: 03-05-1956  MRN: [id number]  Age / Sex: 69 y.o., male  PCP: [employer] Referring Physician: Leesa Kast, DO  Reason for Consultation: Establishing goals of care   HPI/Brief Hospital Course: 69 y.o. male  with past medical history of CHF, COPD, chronic respiratory failure on 3L [place] at baseline, OSA, T2DM, HTN, CAD s/p PCI, peripheral neuropathy admitted from home on 09/01/2024 with chest pain (chronic in nature--moths) and increased shortness of breath.  Admitted and being treated for acute on chronic respiratory failure due to CHF and COPD exacerbation RRT called 1/11 AM due to being found obtunded with significantly elevated CO2 levels on ABG-->123, transferred to SD unit and placed on bipap  Noted recent admit to [facility] 9/13-9/19/2025 due to acute on chronic hypoxic and hypercapnic respiratory failure secondary to COPD exacerbation  Previous admissions in April 2025 and February 2025 for similar presentation  Of note, PMT has followed Cody Alexander during his previous hospitalizations in 2024  Palliative medicine was consulted for assisting with goals of care conversations.  Subjective:  Chart reviewed: Labs:ABG--CO2 retention severe, trend of CMP and CBC since admission Vital signs:Stable Progress Notes:Primary team, podiatry, cardiology, CCM and nursing notes since admission Imaging: CXR, CTA, MR ankle  Visited with Cody Alexander at his bedside. He remains on continuous bipap. He is able to briefly open eyes to calling of his name and gently sternal rub. He is unable to answer questions appropriately or follow commands. No family or visitors at bedside during time of visit.  Concern expressed during CCM collaborative rounds for high risk of mechanical ventilation.  No HCPOA or AD to view in [place]. Per PMT notes from 2024, HCPOA not established.  Call  placed to Cody Alexander listed in emergency contacts as cousin--number has been disconnected. 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 Cody Alexander updated that Cody Alexander was admitted to hospital, attempting to locate [facility]. Cody is aware of a daughter but unaware of her name and shares Ms. Graffam does not have any communication with her. Cody shares she would believe NOK would be Cody Alexander a possible cousin--Cody Alexander is able to provide an updated phone number for Cody.  Called updated number for Cody Alexander-placed in chart. Left HIPAA complaint VM but did receive a call back from [employer].  Introduced myself as a publishing rights manager as a member of the palliative care team. Explained palliative medicine is specialized medical care for people living with serious illness. It focuses on providing relief from the symptoms and stress of a serious illness. The goal is to improve quality of life for both the patient and the family.   Cody confirms she is a cousin of Cody Alexander grew up close together. Cody confirms Cody Alexander is divorced, has a daughter--believes her name is Cody Alexander--unaware of last name. Cody shares during a previous admission Cody attempted communication with Cody Alexander but declined interest in being involved--Cody does not currently have a way to contact [place]. Cody shares Mr. Mehrer parents and siblings are all deceased. Cody shares she lives in [place]  but speaks to Cody Alexander on a regular basis and last visited with him  about a month ago.  Cody shares she feels Cody Alexander has been in poor health for several years. She shares he does not take care of himself as he should, does not monitor his glucose or administer medications as prescribed. He has poor hygiene at baseline. Shares he is unsteady on his feet and falls frequently.    We discussed patient's current illness and what it means in the larger context of patient's on-going co-morbidities. Natural disease trajectory and expectations at EOL were discussed. We discussed his reason for admission and events during hospitalization. Severe CO2 retention, transferred to SDU and on continuous bipap. We discussed the difference between mechanical ventilation and bipap as she shares Cody Alexander has been on life support in the past.  Cody shares she and Cody Alexander have never discussed his goals/wishes for health care decisions. Cody shares he has been intubated in the past and recovered well.  We discuss the difference between Full Code and Do Not Resuscitate, encouraged Cody to consider DNR/DNI status understanding evidenced based poor outcomes in similar hospitalized patients, as the cause of the arrest is likely associated with chronic/terminal disease rather than a reversible acute cardio-pulmonary event. Cody shares at this time she wishes for Cody Alexander to remain Full Code/Full Scope.   I discussed importance of continued conversations with family/support persons and all members of their medical team regarding overall plan of care and treatment options ensuring decisions are in alignment with patients goals of care.  All questions/concerns addressed. Emotional support provided to patient/family/support persons. PMT will continue to follow and support patient as needed.  Objective: Primary Diagnoses: Present on Admission:  Acute on chronic diastolic CHF (congestive heart failure) (HCC)  Essential hypertension  Elevated troponin   Physical Exam Constitutional:      Appearance: He is obese.     Comments: Obtunded, unable to follow commands  Cardiovascular:     Rate and Rhythm: Normal rate and regular rhythm.  Pulmonary:     Effort: Pulmonary effort is normal. No respiratory distress.  Skin:    General: Skin is warm and dry.     Vital Signs:  BP 108/73 (BP Location: Right Arm)   Pulse 75   Temp 98.5 F (36.9 C) (Axillary)   Resp 13   Ht 6' 1 (1.854 m)   Wt 130.4 kg   SpO2 96%   BMI 37.93 kg/m  Pain Scale: CPOT   Pain Score: 7   IO: Intake/output summary:  Intake/Output Summary (Last 24 hours) at 09/05/2024 1731 Last data filed at 09/05/2024 1600 Gross per 24 hour  Intake 240 ml  Output 1050 ml  Net -810 ml    LBM: Last BM Date : 09/03/24 Baseline Weight: Weight: 136.1 kg Most recent weight: Weight: 130.4 kg      Assessment and Plan  SUMMARY OF RECOMMENDATIONS   Continue current plan of care Full Code-Full Scope  Palliative Prophylaxis:   Bowel Regimen, Delirium Protocol and Frequent Pain Assessment  Discussed With: CCM team and nursing staff   Thank you for this consult and allowing Palliative Medicine to participate in the care of Cody Alexander. Palliative medicine will continue to follow and assist as needed.   I personally spent a total of 75 minutes in the care of the patient today including preparing to see the patient, getting/reviewing separately obtained history, performing a medically appropriate exam/evaluation, counseling and educating, referring and communicating with other health care professionals, documenting clinical information in the EHR, and coordinating care.  Signed by: Cody Lesches, DNP, AGNP-C Palliative Medicine    Please contact Palliative Medicine Team phone at [phone] for questions and concerns.  For individual provider: See Amion   "

## 2024-09-05 NOTE — Plan of Care (Signed)
  Problem: Clinical Measurements: Goal: Ability to maintain clinical measurements within normal limits will improve Outcome: Progressing Goal: Diagnostic test results will improve Outcome: Progressing Goal: Respiratory complications will improve Outcome: Progressing   Problem: Education: Goal: Knowledge of General Education information will improve Description: Including pain rating scale, medication(s)/side effects and non-pharmacologic comfort measures Outcome: Not Progressing   Problem: Health Behavior/Discharge Planning: Goal: Ability to manage health-related needs will improve Outcome: Not Progressing

## 2024-09-05 NOTE — Progress Notes (Signed)
 PHARMACY CONSULT NOTE - FOLLOW UP  Pharmacy Consult for Electrolyte Monitoring and Replacement   Recent Labs: Potassium (mmol/L)  Date Value  09/05/2024 4.7  08/20/2013 3.7   Magnesium  (mg/dL)  Date Value  98/90/7973 2.2   Calcium  (mg/dL)  Date Value  98/88/7973 9.3   Calcium , Total (mg/dL)  Date Value  87/73/7985 9.1   Albumin (g/dL)  Date Value  98/88/7973 4.6  08/20/2013 3.7   Phosphorus (mg/dL)  Date Value  98/90/7973 3.4   Sodium (mmol/L)  Date Value  09/05/2024 141  08/20/2013 134 (L)    Assessment: 69 y.o. male with medical history significant for diastolic CHF, COPD, type 2 diabetes mellitus, essential hypertension and peripheral neuropathy admitted for [facility] now transferred to CCU ISO hypercapnic respiratory failure. Pharmayc is asked to follow and replace electrolytes while in CCU  Goal of Therapy:  Electrolytes WNL  Plan:  --no electrolyte replacement warranted for today --recheck electrolytes in am  Adriana JONETTA Bolster ,PharmD Clinical Pharmacist 09/05/2024 12:12 PM

## 2024-09-05 NOTE — Progress Notes (Addendum)
 RAPID RESPONSE   Upon chart review this AM, patient was noted to have critical ABG results at 6:30a 7.21/>123/56/57.  Ordered STAT CMP, CBC, CXR and BiPAP. Ordered STAT repeat ABG to confirm findings.  Was called to bedside to evaluate patient at 9:0)am as he was placed on Bipap but remained unresponsive. Upon arrival he opens eyes briefly to sternal rub but is otherwise unresponsive.   CXR pending radiology read. On my review shows vascular congestion though not significantly worse than prior. Has been on 40mg  IV lasix  BID, will give additional dose now.  Labs still pending.  Vitals remain stable.      09/05/2024    9:00 AM 09/05/2024    5:08 AM 09/05/2024    4:11 AM  Vitals with BMI  Systolic 120 136 860  Diastolic 73 93 85  Pulse 87 82 89   Transfer to Stepdown unit for closer monitoring. May require intubation if ABG without improvement on Bipap  *Handed patient off in unit directly to ICU team including Dr. Isaiah    CRITICAL CARE Performed by: Lorane Poland Total critical care time: 55 minutes Critical care time was exclusive of separately billable procedures and treating other patients. Critical care was necessary to treat or prevent imminent or life-threatening deterioration. Critical care was time spent personally by me on the following activities: development of treatment plan with patient and/or surrogate as well as nursing, discussions with consultants, evaluation of patient's response to treatment, examination of patient, obtaining history from patient or surrogate, ordering and performing treatments and interventions, ordering and review of laboratory studies, ordering and review of radiographic studies, pulse oximetry and re-evaluation of patient's condition.

## 2024-09-05 NOTE — Progress Notes (Signed)
 " PROGRESS NOTE    Cody Alexander  FMW:[id number] DOB: 08-Oct-1955 DOA: 09/01/2024 PCP: [facility]  Chief Complaint  Patient presents with   Chest Pain    Hospital Course:  Cody Alexander is a 69 year old male with diastolic CHF, COPD, chronic hypoxic respiratory failure on 3 L at baseline, type 2 diabetes, essential hypertension, peripheral neuropathy, who presents to the ED with a constellation of symptoms including midsternal chest pain, orthopnea, lower extremity edema, and ankle pain. In the ED proBNP 96, high-sensitivity troponin 66 with repeat 74.  Labs and vitals otherwise within normal limits. Chest CTA negative for PE, mild bibasilar atelectasis with mild patchy ground glass opacities in the lung bases bilaterally favoring edema. He was started on IV diuresis and admitted. She was doing better and was pending SNF placement. Rapid response on 1/11 for severe hypercapnia.  Patient was transferred to the ICU on BiPAP.  Subjective: Bed response this morning.  Please see rapid response note for more information Patient is now in ICU and on BiPAP.  Mentation is improving very slowly.  Now following some commands.  Is not opening his eyes or communicating   Objective: Vitals:   09/05/24 1000 09/05/24 1100 09/05/24 1114 09/05/24 1200  BP:    (!) 97/57  Pulse: 81 79  76  Resp: 13 13  12   Temp:    97.9 F (36.6 C)  TempSrc:    Axillary  SpO2: 93% 92% 92% 93%  Weight:      Height:        Intake/Output Summary (Last 24 hours) at 09/05/2024 1415 Last data filed at 09/05/2024 1300 Gross per 24 hour  Intake 360 ml  Output 1275 ml  Net -915 ml   Filed Weights   09/01/24 1923 09/05/24 0955  Weight: 136.1 kg 130.4 kg    Examination: General exam: Appears calm and comfortable, NAD  Respiratory system: On BiPAP, poor aeration bilaterally, somewhat limited by body habitus Cardiovascular system: S1 & S2 heard, RRR.  Neuro: Obtunded, occasionally follows  commands, does not open eyes Extremities: Bilateral feet and lower legs demonstrates skin thickening, flaking, debris  Assessment & Plan:  Principal Problem:   Acute on chronic diastolic CHF (congestive heart failure) (HCC) Active Problems:   Left ankle pain   Syncope   Elevated troponin   Essential hypertension    Acute on Chronic hypercapnic respiratory failure - ABG with initial CO2 of 140s, pH 7.16 - Was initiated on BiPAP, has had minimal improvement in mentation. - CO2 is downtrending on serial ABGs though he does remain severely hypercapnic - Continue with BiPAP for now, wean as able.  Remains very high risk for intubation and cardiac arrest - Pulmonary critical care team is following - Repeat CXR this a.m. with some vascular congestion but not acutely worse than prior.  Was given additional dose of 40 IVP Lasix .  Also started on 125 Solu-Medrol  and nebulizer therapies.  Acute on chronic diastolic CHF - Has been on 40 IV twice daily, additional dose x 1 today - Was diuresing well, down 7.5 L since admission.  Appears to be reaching euvolemic state - Will hold further diuresis for now. - Continue to follow strict I's and O's, kidney function is tolerating - Echocardiogram revealed known severe left ventricular hypertrophy, no regional wall motion abnormalities, indeterminate diastolic filling. - Given severity of LVH have reached out to [facility] cardiology.  Patient follows with [facility] cardiology outpatient though it has been sometime since he has seen  them - Previously was referred to heart failure team but has had difficulty making appointments due to no transportation - Continue with PT/OT evals - Continue to titrate GDMT as tolerated  Elevated troponin - On arrival.  Thought to be secondary to acute CHF.  Had no further chest pain - Low threshold to repeat troponin or EKG if chest pain recurs  - Chest pain is reproducible upon palpation - Echo without regional wall motion  abnormality.  Cardiology consult as above  CAD with prior PCI - Resume home meds. - Last LHC in 2019 - Does not appear he is currently taking medications, have started aspirin .  Cardiology consultation as well  Syncope - Monitor on telemetry - Orthostatic vitals: Negative  Left ankle pain Prior left Achilles tendon injury - Podiatry has been consulted.  No bony abnormality on plain film - MRI showing partial tear - Plan for conservative management for now.  Podiatry has ordered equalizer walking boot. - Patient is not a surgical candidate - PT/OT currently recommending SNF, TOC consulted for these arrangements  Hypertension - Resume home meds, resume gradually.  COPD Chronic hypoxic respiratory failure - Baseline O2 3 L - Currently being treated for COPD exacerbation with steroid therapy and nebulizers'  Type 2 diabetes - A1c 7.6% - Continue sliding scale for now  Rash - Has been seen outpatient by dermatology.  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 Obesity Class II - Outpatient follow up for lifestyle modification and risk factor management  Generalized deconditioning - Deconditioning beyond his baseline secondary to new heel pain.  PT/OT recommending SNF,  DVT prophylaxis: Lovenox    Code Status: Full Code Disposition: Needs SNF  Consultants:  Treatment Team:  Consulting Physician: Lennie Barter, DPM  Procedures:    Antimicrobials:  Anti-infectives (From admission, onward)    None       Data Reviewed: I have personally reviewed following labs and imaging studies CBC: Recent Labs  Lab 09/01/24 1407 09/01/24 2006 09/03/24 0436 09/05/24 0928  WBC 6.7 6.5 8.4 10.5  NEUTROABS  --   --   --   9.1*  HGB 12.4* 11.7* 13.8 14.1  HCT 41.3 38.3* 44.7 47.6  MCV 97.2 96.7 95.1 99.2  PLT 210 207 235 237   Basic Metabolic Panel: Recent Labs  Lab 09/01/24 1407 09/01/24 2006 09/03/24 0436 09/05/24 0928  NA 140  --  139 141  K 4.7  --  4.1 4.7  CL 96*  --  92* 88*  CO2 40*  --  38* 44*  GLUCOSE 168*  --  155* 279*  BUN 16  --  16 29*  CREATININE 0.91 0.92 0.92 1.45*  CALCIUM  9.4  --  9.3 9.3  MG  --   --  2.2  --   PHOS  --   --  3.4  --    GFR: Estimated Creatinine Clearance: 70 mL/min (A) (by C-G formula based on SCr of 1.45 mg/dL (H)). Liver Function Tests: Recent Labs  Lab 09/03/24 0436 09/05/24 0928  AST 26 22  ALT 18 21  ALKPHOS 75 88  BILITOT 1.0 0.7  PROT 7.3 8.2*  ALBUMIN 4.0 4.6   CBG: Recent Labs  Lab 09/04/24 2115  09/05/24 0514 09/05/24 0909 09/05/24 0946 09/05/24 1138  GLUCAP 206* 258* 265* 268* 251*    Recent Results (from the past 240 hours)  Resp panel by RT-PCR (RSV, Flu A&B, Covid) Nasal Mucosa     Status: None   Collection Time: 09/05/24 10:00 AM   Specimen: Nasal Mucosa; Nasal Swab  Result Value Ref Range Status   SARS Coronavirus 2 by RT PCR NEGATIVE NEGATIVE Final    Comment: (NOTE) SARS-CoV-2 target nucleic acids are NOT DETECTED.  The SARS-CoV-2 RNA is generally detectable in upper respiratory specimens during the acute phase of infection. The lowest concentration of SARS-CoV-2 viral copies this assay can detect is 138 copies/mL. A negative result does not preclude SARS-Cov-2 infection and should not be used as the sole basis for treatment or other patient management decisions. A negative result may occur with  improper specimen collection/handling, submission of specimen other than nasopharyngeal swab, presence of viral mutation(s) within the areas targeted by this assay, and inadequate number of viral copies(<138 copies/mL). A negative result must be combined with clinical observations, patient history, and  epidemiological information. The expected result is Negative.  Fact Sheet for Patients:  bloggercourse.com  Fact Sheet for Healthcare Providers:  seriousbroker.it  This test is no t yet approved or cleared by the [place]  FDA and  has been authorized for detection and/or diagnosis of SARS-CoV-2 by FDA under an Emergency Use Authorization (EUA). This EUA will remain  in effect (meaning this test can be used) for the duration of the COVID-19 declaration under Section 564(b)(1) of the Act, 21 U.S.C.section 360bbb-3(b)(1), unless the authorization is terminated  or revoked sooner.       Influenza A by PCR NEGATIVE NEGATIVE Final   Influenza B by PCR NEGATIVE NEGATIVE Final    Comment: (NOTE) The Xpert Xpress SARS-CoV-2/FLU/RSV plus assay is intended as an aid in the diagnosis of influenza from Nasopharyngeal swab specimens and should not be used as a sole basis for treatment. Nasal washings and aspirates are unacceptable for Xpert Xpress SARS-CoV-2/FLU/RSV testing.  Fact Sheet for Patients: bloggercourse.com  Fact Sheet for Healthcare Providers: seriousbroker.it  This test is not yet approved or cleared by the [place]  FDA and has been authorized for detection and/or diagnosis of SARS-CoV-2 by FDA under an Emergency Use Authorization (EUA). This EUA will remain in effect (meaning this test can be used) for the duration of the COVID-19 declaration under Section 564(b)(1) of the Act, 21 U.S.C. section 360bbb-3(b)(1), unless the authorization is terminated or revoked.     Resp Syncytial Virus by PCR NEGATIVE NEGATIVE Final    Comment: (NOTE) Fact Sheet for Patients: bloggercourse.com  Fact Sheet for Healthcare Providers: seriousbroker.it  This test is not yet approved or cleared by the [place]  FDA and has been  authorized for detection and/or diagnosis of SARS-CoV-2 by FDA under an Emergency Use Authorization (EUA). This EUA will remain in effect (meaning this test can be used) for the duration of the COVID-19 declaration under Section 564(b)(1) of the Act, 21 U.S.C. section 360bbb-3(b)(1), unless the authorization is terminated or revoked.  Performed at [facility], [address] [place], [place] [address]   MRSA Next Gen by PCR, Nasal     Status: None   Collection Time: 09/05/24 10:00 AM   Specimen: Nasal Mucosa; Nasal Swab  Result Value Ref Range Status   MRSA by PCR Next Gen NOT DETECTED NOT DETECTED Final    Comment: (NOTE) The GeneXpert MRSA Assay (FDA approved  for NASAL specimens only), is one component of a comprehensive MRSA colonization surveillance program. It is not intended to diagnose MRSA infection nor to guide or monitor treatment for MRSA infections. Test performance is not FDA approved in patients less than 37 years old. Performed at [facility], [address] [place], [place] [address]      Radiology Studies: DG Chest [place] 1 View Result Date: 09/05/2024 EXAM: 1 VIEW XRAY OF THE CHEST 09/05/2024 09:17:00 AM COMPARISON: 09/01/2024 CLINICAL HISTORY: 69 year old male with hypercapnic respiratory failure (hepatocellular carcinoma). FINDINGS: LUNGS AND PLEURA: Low lung volumes. Linear opacity in left mid lung, and patchy left greater than right lung bases, stable compared to prior exam. No pleural effusion. No pneumothorax. HEART AND MEDIASTINUM: Cardiomegaly, stable. Aortic atherosclerosis. BONES AND SOFT TISSUES: No acute osseous abnormality. IMPRESSION: 1. Ongoing low lung volumes, atelectasis. Electronically signed by: Helayne Hurst MD MD 09/05/2024 09:28 AM EST RP Workstation: [id number]    Scheduled Meds:  aspirin   81 mg Oral Daily   atorvastatin   80 mg Oral Daily   Chlorhexidine  Gluconate Cloth  6 each Topical Daily   enoxaparin  (LOVENOX )  injection  65 mg Subcutaneous Q24H   insulin  aspart  0-20 Units Subcutaneous Q4H   ipratropium-albuterol   3 mL Nebulization Q6H   losartan   25 mg Oral Daily   methylPREDNISolone  (SOLU-MEDROL ) injection  40 mg Intravenous Q12H   mouth rinse  15 mL Mouth Rinse 4 times per day   spironolactone   25 mg Oral q morning   triamcinolone  0.1 % cream : eucerin   Topical TID   Continuous Infusions:   LOS: 4 days  MDM: Patient is high risk for one or more organ failure.  They necessitate ongoing hospitalization for continued IV therapies and subsequent lab monitoring. Total time spent interpreting labs and vitals, reviewing the medical record, coordinating care amongst consultants and care team members, directly assessing and discussing care with the patient and/or family: 55 min  Raylinn Kosar, DO [facility]  To contact the attending physician between 7A-7P please use Epic Chat. To contact the covering physician during after hours 7P-7A, please review Amion.  09/05/2024, 2:15 PM   *This document has been created with the assistance of dictation software. Please excuse typographical errors. *   "

## 2024-09-06 DIAGNOSIS — I5033 Acute on chronic diastolic (congestive) heart failure: Secondary | ICD-10-CM | POA: Diagnosis not present

## 2024-09-06 LAB — CBC
HCT: 41.4 % (ref 39.0–52.0)
Hemoglobin: 12.9 g/dL — ABNORMAL LOW (ref 13.0–17.0)
MCH: 29.9 pg (ref 26.0–34.0)
MCHC: 31.2 g/dL (ref 30.0–36.0)
MCV: 95.8 fL (ref 80.0–100.0)
Platelets: 251 K/uL (ref 150–400)
RBC: 4.32 MIL/uL (ref 4.22–5.81)
RDW: 12.7 % (ref 11.5–15.5)
WBC: 11.5 K/uL — ABNORMAL HIGH (ref 4.0–10.5)
nRBC: 0 % (ref 0.0–0.2)

## 2024-09-06 LAB — BLOOD GAS, ARTERIAL
Acid-Base Excess: 19.5 mmol/L — ABNORMAL HIGH (ref 0.0–2.0)
Bicarbonate: 48.9 mmol/L — ABNORMAL HIGH (ref 20.0–28.0)
FIO2: 50 %
O2 Saturation: 97.7 %
Patient temperature: 37
pCO2 arterial: 79 mmHg (ref 32–48)
pH, Arterial: 7.4 (ref 7.35–7.45)
pO2, Arterial: 82 mmHg — ABNORMAL LOW (ref 83–108)

## 2024-09-06 LAB — BASIC METABOLIC PANEL WITH GFR
Anion gap: 10 (ref 5–15)
BUN: 44 mg/dL — ABNORMAL HIGH (ref 8–23)
CO2: 41 mmol/L — ABNORMAL HIGH (ref 22–32)
Calcium: 9.3 mg/dL (ref 8.9–10.3)
Chloride: 90 mmol/L — ABNORMAL LOW (ref 98–111)
Creatinine, Ser: 1.83 mg/dL — ABNORMAL HIGH (ref 0.61–1.24)
GFR, Estimated: 40 mL/min — ABNORMAL LOW
Glucose, Bld: 192 mg/dL — ABNORMAL HIGH (ref 70–99)
Potassium: 4.1 mmol/L (ref 3.5–5.1)
Sodium: 141 mmol/L (ref 135–145)

## 2024-09-06 LAB — GLUCOSE, CAPILLARY
Glucose-Capillary: 143 mg/dL — ABNORMAL HIGH (ref 70–99)
Glucose-Capillary: 166 mg/dL — ABNORMAL HIGH (ref 70–99)
Glucose-Capillary: 195 mg/dL — ABNORMAL HIGH (ref 70–99)
Glucose-Capillary: 223 mg/dL — ABNORMAL HIGH (ref 70–99)
Glucose-Capillary: 226 mg/dL — ABNORMAL HIGH (ref 70–99)
Glucose-Capillary: 271 mg/dL — ABNORMAL HIGH (ref 70–99)

## 2024-09-06 LAB — MAGNESIUM: Magnesium: 2 mg/dL (ref 1.7–2.4)

## 2024-09-06 MED ORDER — LOSARTAN POTASSIUM 25 MG PO TABS: 25.0000 mg | ORAL_TABLET | Freq: Every day | ORAL | Status: DC

## 2024-09-06 MED ORDER — SODIUM CHLORIDE 0.9 % IV BOLUS
250.0000 mL | Freq: Once | INTRAVENOUS | Status: AC
  Administered 2024-09-06: 250 mL via INTRAVENOUS

## 2024-09-06 MED ORDER — DEXMEDETOMIDINE HCL IN NACL 400 MCG/100ML IV SOLN: 0.0000 ug/kg/h | INTRAVENOUS | Status: DC

## 2024-09-06 NOTE — Progress Notes (Signed)
 SLP Cancellation Note  Patient Details Name: Cody Alexander MRN: [id number] DOB: 15-Sep-1955   Cancelled treatment:       Reason Eval/Treat Not Completed: SLP screened, no needs identified, will sign off (chart reviewed; consulted NSG and met w/ pt in room.)   Reviewed chart. Order for regular consistency diet placed by MD. Pt has eaten meals today and consumed several ozs of thin liquids w/ coffee on tray table presently. Pt was A/Ox3; verbally engaged w/ this SLP. O2 sats 93-95% on HFNC O2 support.  Pt denied any difficulty swallowing w his food/drinks today. NSG presented stated the same. Pt and NSG denied any difficulty swallowing Pills w/ water  also. Pt conversed in conversation; somewhat distracted at times. Speech clear, intelligible . No immediate, skilled ST services indicated as pt appears at his swallowing baseline currently. Pt/NSG agreed. NSG to reconsult if any change in status while admitted.  Encouraged pt to eat/drink Slowly and to sit Upright when eating/drinking- common aspiration precautions. Pt agreed.      Comer Portugal, MS, CCC-SLP Speech Language Pathologist Rehab Services; [facility] [phone] (ascom) Skie Vitrano 09/06/2024, 1:51 PM

## 2024-09-06 NOTE — Plan of Care (Signed)
" °  Problem: Education: Goal: Knowledge of General Education information will improve Description: Including pain rating scale, medication(s)/side effects and non-pharmacologic comfort measures Outcome: Not Progressing   Problem: Health Behavior/Discharge Planning: Goal: Ability to manage health-related needs will improve Outcome: Not Progressing   Problem: Clinical Measurements: Goal: Respiratory complications will improve Outcome: Not Progressing Goal: Cardiovascular complication will be avoided Outcome: Not Progressing   Problem: Activity: Goal: Risk for activity intolerance will decrease Outcome: Not Progressing   Problem: Nutrition: Goal: Adequate nutrition will be maintained Outcome: Not Progressing   Problem: Coping: Goal: Level of anxiety will decrease Outcome: Not Progressing   Problem: Elimination: Goal: Will not experience complications related to bowel motility Outcome: Not Progressing Goal: Will not experience complications related to urinary retention Outcome: Not Progressing   Problem: Pain Managment: Goal: General experience of comfort will improve and/or be controlled Outcome: Not Progressing   Problem: Health Behavior/Discharge Planning: Goal: Ability to identify and utilize available resources and services will improve Outcome: Not Progressing Goal: Ability to manage health-related needs will improve Outcome: Not Progressing   Problem: Metabolic: Goal: Ability to maintain appropriate glucose levels will improve Outcome: Not Progressing   Problem: Nutritional: Goal: Maintenance of adequate nutrition will improve Outcome: Not Progressing   "

## 2024-09-06 NOTE — Progress Notes (Signed)
 [facility] CLINIC CARDIOLOGY PROGRESS NOTE   Patient ID: Cody Alexander MRN: [id number] DOB/AGE: 03/07/1956 69 y.o.  Admit date: 09/01/2024 Referring Physician Dr. Alexandra Dezii Primary Physician [facility]  Primary Cardiologist [facility]  Reason for Consultation AoCHF, severe LVH  HPI: Maxten Shuler is a 69 y.o. male with a past medical history of chronic HFpEF, severe LVH, COPD with chronic hypoxic respiratory failure on home O2, hypertension, type 2 diabetes who presented to the ED on 09/01/2024 for chest discomfort, shortness of breath, leg swelling. Cardiology was consulted for further evaluation.   Interval History:  -Patient seen and examined this morning, resting in bed on high flow nasal cannula. - Denies any chest discomfort or shortness of breath.  States that he just feels congested. - BP has been stable.  Review of systems complete and found to be negative unless listed above   Vitals:   09/06/24 0700 09/06/24 0800 09/06/24 0900 09/06/24 1000  BP: 99/68 (!) 85/61 108/69 110/74  Pulse: 81 78 75 94  Resp: (!) 21     Temp:  98.9 F (37.2 C)    TempSrc:  Oral    SpO2: 97%     Weight:      Height:         Intake/Output Summary (Last 24 hours) at 09/06/2024 1108 Last data filed at 09/06/2024 0600 Gross per 24 hour  Intake --  Output 950 ml  Net -950 ml     PHYSICAL EXAM General: Ill-appearing male, well nourished, in no acute distress. HEENT: Normocephalic and atraumatic. Neck: No JVD.  Lungs: Normal respiratory effort on HFNC. Clear bilaterally to auscultation. No wheezes, crackles, rhonchi.  Heart: HRRR. Normal S1 and S2 without gallops or murmurs. Radial & DP pulses 2+ bilaterally. Abdomen: Non-distended appearing.  Msk: Normal strength and tone for age. Extremities: No clubbing, cyanosis or edema.   Neuro: Alert and oriented X 3. Psych: Mood appropriate, affect congruent.    LABS: Basic Metabolic Panel: Recent Labs    09/05/24 2028  09/06/24 0348  NA 142 141  K 4.3 4.1  CL 89* 90*  CO2 44* 41*  GLUCOSE 180* 192*  BUN 36* 44*  CREATININE 1.54* 1.83*  CALCIUM  9.4 9.3  MG 2.1 2.0  PHOS 3.4  --    Liver Function Tests: Recent Labs    09/05/24 0928 09/05/24 2028  AST 22 20  ALT 21 19  ALKPHOS 88 72  BILITOT 0.7 0.6  PROT 8.2* 7.1  ALBUMIN 4.6 4.2   No results for input(s): LIPASE, AMYLASE in the last 72 hours. CBC: Recent Labs    09/05/24 0928 09/05/24 2028 09/06/24 0348  WBC 10.5 9.5 11.5*  NEUTROABS 9.1*  --   --   HGB 14.1 13.3 12.9*  HCT 47.6 42.9 41.4  MCV 99.2 95.1 95.8  PLT 237 248 251   Cardiac Enzymes: No results for input(s): CKTOTAL, CKMB, CKMBINDEX, TROPONINIHS in the last 72 hours. BNP: No results for input(s): BNP in the last 72 hours. D-Dimer: No results for input(s): DDIMER in the last 72 hours. Hemoglobin A1C: No results for input(s): HGBA1C in the last 72 hours. Fasting Lipid Panel: No results for input(s): CHOL, HDL, LDLCALC, TRIG, CHOLHDL, LDLDIRECT in the last 72 hours. Thyroid Function Tests: No results for input(s): TSH, T4TOTAL, T3FREE, THYROIDAB in the last 72 hours.  Invalid input(s): FREET3 Anemia Panel: No results for input(s): VITAMINB12, FOLATE, FERRITIN, TIBC, IRON, RETICCTPCT in the last 72 hours.  DG Chest [facility] 1 [address]  Result Date: 09/05/2024 EXAM: 1 VIEW XRAY OF THE CHEST 09/05/2024 09:17:00 AM COMPARISON: 09/01/2024 CLINICAL HISTORY: 69 year old male with hypercapnic respiratory failure (hepatocellular carcinoma). FINDINGS: LUNGS AND PLEURA: Low lung volumes. Linear opacity in left mid lung, and patchy left greater than right lung bases, stable compared to prior exam. No pleural effusion. No pneumothorax. HEART AND MEDIASTINUM: Cardiomegaly, stable. Aortic atherosclerosis. BONES AND SOFT TISSUES: No acute osseous abnormality. IMPRESSION: 1. Ongoing low lung volumes, atelectasis. Electronically signed by:  Helayne Hurst MD MD 09/05/2024 09:28 AM EST RP Workstation: [id number]     ECHO 08/2024: 1. Left ventricular ejection fraction, by estimation, is 60 to 65%. The left ventricle has normal function. The left ventricle has no regional wall motion abnormalities. There is severe concentric left ventricular hypertrophy. Indeterminate diastolic filling due to E-A fusion. No SAM or LVOT gradient visualized.   2. Right ventricular systolic function is normal. The right ventricular  size is normal.   3. The mitral valve is grossly normal. No evidence of mitral valve  regurgitation. No evidence of mitral stenosis.   4. The aortic valve has an indeterminant number of cusps. Aortic valve  regurgitation is not visualized. No aortic stenosis is present.   5. Aortic dilatation noted. There is mild dilatation of the ascending  aorta, measuring 41 mm.   6. The inferior vena cava is dilated in size with >50% respiratory  variability, suggesting right atrial pressure of 8 mmHg.   TELEMETRY (personally reviewed): sinus rhythm rate 70s  EKG (personally reviewed): Normal sinus rhythm first-degree AV block rate 78 bpm  DATA reviewed by me 09/06/2024: last 24h vitals tele labs imaging I/O, hospitalist progress note  Principal Problem:   Acute on chronic diastolic CHF (congestive heart failure) (HCC) Active Problems:   Essential hypertension   Elevated troponin   Left ankle pain   Syncope    ASSESSMENT AND PLAN: Kipling Graser is a 69 y.o. male with a past medical history of chronic HFpEF, severe LVH, COPD with chronic hypoxic respiratory failure on home O2, hypertension, type 2 diabetes who presented to the ED on 09/01/2024 for chest discomfort, shortness of breath, leg swelling. Cardiology was consulted for further evaluation.   # Acute hypercapnic respiratory failure # Acute on chronic HFpEF # Hypertension Patient presented with worsening cough and shortness of breath, treated for acute heart failure as  well as hypercapnic respiratory failure.  Echo this admission with a EF 60 to 65%, no wall motion abnormalities, severe concentric LVH without obvious obstruction. - Further diuresis currently held. - Continue losartan  25 mg daily, spironolactone  25 mg daily. - Continue atorvastatin  80 mg daily, aspirin  81 mg daily. - Further management of respiratory failure as per primary team.  This patient's case was discussed and created with Dr. Florencio and he is in agreement.  Signed:  Danita Bloch, PA-C  09/06/2024, 11:08 AM [facility] Cardiology

## 2024-09-06 NOTE — Progress Notes (Signed)
 "                                                                                                                                                                                               Palliative Care Progress Note, Assessment & Plan   [name]       Date: 09/06/2024 DOB: [birth year]  Age: 69 y.o. MRN#: [id number] Attending Physician: Leesa Kast, DO Primary Care Physician: [facility] Admit Date: 09/01/2024  Subjective: Patient seen in ICU.  He is awake, alert, able to acknowledge my presence, and make his wishes known.  He is on high flow nasal cannula.  No family or friends present at bedside during my visit.  HPI: 69 y.o. male  with past medical history of CHF, COPD, chronic respiratory failure on 3L [place] at baseline, OSA, T2DM, HTN, CAD s/p PCI, peripheral neuropathy admitted from home on 09/01/2024 with chest pain (chronic in nature--moths) and increased shortness of breath.   Admitted and being treated for acute on chronic respiratory failure due to CHF and COPD exacerbation RRT called 1/11 AM due to being found obtunded with significantly elevated CO2 levels on ABG-->123, transferred to SD unit and placed on bipap   Noted recent admit to [facility] 9/13-9/19/2025 due to acute on chronic hypoxic and hypercapnic respiratory failure secondary to COPD exacerbation   Previous admissions in April 2025 and February 2025 for similar presentation   Of note, PMT has followed Cody Alexander during his previous hospitalizations in 2024   Palliative medicine was consulted for assisting with goals of care conversations.  Following up today for continued goals of care discussions and review of CODE STATUS/patient's wishes.  Summary of counseling/coordination of care: Chart review completed prior to meeting patient including:  -Labs: CBC 11.5, hemoglobin stable at 12.9, creatinine elevated at 1.83, GFR reduced at 40, most recent pCO2 79 with bicarb  48.9 -Vital signs: Note 1 mild hypotension episode at 8 AM 85/61 however vital signs including blood pressure have been within normal limits since that time.  Patient remains on heated high flow nasal cannula at 40 L/min and 50% -Progress notes: Reviewed cardiology's note on 1/12 recommending continue diuresing, losartan , spironolactone , atorvastatin , aspirin , and continued management of respiratory failure by CCM/primary team, reviewed SLP note on 1/12 sharing patient has no increased risk of aspiration and that, and aspiration precautions should be engaged  After reviewing the patient's chart and assessing the patient at bedside, I spoke with patient in regards to symptom management and goals of care.   Patient is alert and oriented x 4.  He has a  clear understanding of the reasons for this hospitalization including respiratory failure and chest pain.  Discussed COPD is a chronic, aggressive aggressive, irreversible disease that is often exacerbated by acute illnesses and hospitalizations.  Additionally, discussed HFpEF with EF of 60 to 65%.  Discussed strain on the heart when the lungs become compromised.  Also, discussed strain on lungs when the heart is under duress as it stays under his severe concentric LVH.  Patient shares understanding that his heart has to pump a little harder and that his lungs do not work like they used to.  He shares he has been oxygen at home for a long time.  He shares she is grateful that he feels back to his baseline.  He shares he knows he was not looking good when he first came into the hospital.  However, he shares that today is a better day.  Discussed advance care planning, CODE STATUS, and boundaries of care.  Patient shares that he has no family to contact as his next of kin.  He shares that his cousin Cody Alexander is actually his ex-husband.  However, he endorses that in the event that he is unable to speak for himself that he would want her to be his surrogate decision  maker.  Additionally, he would want us  to speak with his 40 year old friend and housemate Cody Alexander to keep her updated on his medical condition should he be unable to speak for himself.  Discussed importance of creating advanced directives to make not only his wishes known but to name his next of kin decision maker.  Education provided on AD.  However, patient declined consult to create 1 at this time.  Reviewed CODE STATUS.  Education provided on the difference tween full code, DNR with full interventions, and DNR with limited interventions.  Patient endorses that he would be accepting of ACLS/CPR in the event of a cardiopulmonary arrest.  He would be in agreement with full code and prearrest interventions.  However, he is very clear that he would never want to live long-term on ventilatory support or machines.  He would never want a trach/PEG.  Symptoms assessed.  Patient endorses he will eventually need to use the bathroom but does not need to use it right now.  He denies N/V/D and constipation.  He shares he has no difficulty with swallowing and denies dry mouth.  He has no acute concerns or issues at this time.  No adjustment to [facility] needed.  After visiting with the patient, spoke with his RN.  She has no concerns about current plan of care.  Conveyed above discussion to RN.  No change to plan of care at this time.   PMT will continue to follow and support.  Physical Exam Vitals reviewed.  Constitutional:      General: He is not in acute distress.    Appearance: He is obese.  HENT:     Head: Normocephalic.     Mouth/Throat:     Mouth: Mucous membranes are moist.  Eyes:     Pupils: Pupils are equal, round, and reactive to light.  Cardiovascular:     Rate and Rhythm: Normal rate.     Pulses: Normal pulses.  Pulmonary:     Comments: HHFNC Musculoskeletal:     Comments: Generalized weakness  Skin:    General: Skin is warm and dry.  Neurological:     Mental Status: He is alert and  oriented to person, place, and time.  Psychiatric:  Mood and Affect: Mood normal.        Behavior: Behavior normal.              Recommendations:   Full code and full scope remain Patient's NOK is Cody Alexander -patient declined creation of AD at this time  I personally spent a total of 35 minutes in the care of the patient today including preparing to see the patient, getting/reviewing separately obtained history, performing a medically appropriate exam/evaluation, counseling and educating, and documenting clinical information in the EHR.   Lamarr L. Arvid, DNP, FNP-BC Palliative Medicine Team   "

## 2024-09-06 NOTE — Progress Notes (Signed)
 " PROGRESS NOTE    Cody Alexander  FMW:[id number] DOB: June 11, 1956 DOA: 09/01/2024 PCP: [facility]  Chief Complaint  Patient presents with   Chest Pain    Hospital Course:  Cody Alexander is a 69 year old male with diastolic CHF, COPD, chronic hypoxic respiratory failure on 3 L at baseline, type 2 diabetes, essential hypertension, peripheral neuropathy, who presents to the ED with a constellation of symptoms including midsternal chest pain, orthopnea, lower extremity edema, and ankle pain. In the ED proBNP 96, high-sensitivity troponin 66 with repeat 74.  Labs and vitals otherwise within normal limits. Chest CTA negative for PE, mild bibasilar atelectasis with mild patchy ground glass opacities in the lung bases bilaterally favoring edema. He was started on IV diuresis and admitted. She was doing better and was pending SNF placement. Rapid response on 1/11 for severe hypercapnia.  Patient was transferred to the ICU on BiPAP. Patient has improved significantly on BiPAP, tolerating heated high flow.  Subjective: Patient is alert and oriented this morning.  He is requesting to eat breakfast.  He does not recall any of the events over the last 24 hours.  Objective: Vitals:   09/06/24 1400 09/06/24 1430 09/06/24 1500 09/06/24 1630  BP: 99/64 113/74 (!) 107/54 131/76  Pulse: 86 86 94 87  Resp:    19  Temp:    98.3 F (36.8 C)  TempSrc:    Oral  SpO2: 92%   95%  Weight:      Height:        Intake/Output Summary (Last 24 hours) at 09/06/2024 1651 Last data filed at 09/06/2024 1400 Gross per 24 hour  Intake 480 ml  Output 625 ml  Net -145 ml   Filed Weights   09/01/24 1923 09/05/24 0955  Weight: 136.1 kg 130.4 kg    Examination: General exam: Appears calm and comfortable, NAD  Respiratory system: Heated high flow, better aeration bilaterally.  Lung exam somewhat limited by body habitus Cardiovascular system: S1 & S2 heard, RRR.  Neuro: Obtunded,  occasionally follows commands, does not open eyes Extremities: Bilateral feet and lower legs demonstrates skin thickening, flaking, debris  Assessment & Plan:  Principal Problem:   Acute on chronic diastolic CHF (congestive heart failure) (HCC) Active Problems:   Left ankle pain   Syncope   Elevated troponin   Essential hypertension    Acute on Chronic hypercapnic respiratory failure - 1/11 ABG with initial CO2 of 140s, pH 7.16 - Was moved to ICU on BiPAP, has had significant improvement now.  Tolerating heated high flow - Continue to wean oxygen as able - CO2 downtrending on serial ABGs - Pulmonary critical care team following - Repeat CXR with some vascular congestion but no acutely worsened before - Continue with steroids, nebulizer therapies  Acute on chronic diastolic CHF - Has been on 40 IV twice daily, additional dose x 1 today - Was diuresing well, down 7.5 L since admission.  Appears to be reaching euvolemic state - Will hold further diuresis for now. - Continue to follow strict I's and O's, AKI developing - Echocardiogram revealed known severe left ventricular hypertrophy, no regional wall motion abnormalities, indeterminate diastolic filling. - Given severity of LVH have reached out to [facility] cardiology.  Patient follows with [facility] cardiology outpatient though it has been sometime since he has seen them - Previously was referred to heart failure team but has had difficulty making appointments due to no transportation - Continue with PT/OT evals - Continue to titrate GDMT  as tolerated  Elevated troponin - On arrival.  Thought to be secondary to acute CHF.  Had no further chest pain - Low threshold to repeat troponin or EKG if chest pain recurs  - Chest pain is reproducible upon palpation - Echo without regional wall motion abnormality.  Cardiology consult as above  CAD with prior PCI - Resume home meds. - Last LHC in 2019 - Does not appear he is currently taking  medications, have started aspirin .  Cardiology consultation as well  Syncope - Monitor on telemetry - Orthostatic vitals: Negative  Left ankle pain Prior left Achilles tendon injury - Podiatry has been consulted.  No bony abnormality on plain film - MRI showing partial tear - Plan for conservative management for now.  Podiatry has ordered equalizer walking boot. - Patient is not a surgical candidate - PT/OT currently recommending SNF, TOC consulted for these arrangements  Hypertension - Resume home meds, resume gradually.  COPD Chronic hypoxic respiratory failure - Home O2 3 L.  Severe COPD - Currently receiving treatment for COPD exacerbation with steroid therapy and nebulizers  Obstructive sleep apnea - Continue with CPAP at night - Suspect patient may also have some degree of OHS with severity of hypercapnia  Type 2 diabetes - A1c 7.6% - Continue sliding scale for now  Rash - Has been seen outpatient by dermatology.  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 Obesity Class II - Outpatient follow up for lifestyle modification and risk factor management  Generalized deconditioning - Deconditioning beyond his baseline secondary to new heel pain.  PT/OT recommending SNF,  DVT prophylaxis: Lovenox    Code Status: Full Code Disposition: Needs SNF  Consultants:  Treatment Team:  Consulting Physician: Lennie Barter, DPM  Procedures:    Antimicrobials:  Anti-infectives (From admission, onward)    None       Data Reviewed: I have personally reviewed following labs and imaging studies CBC: Recent Labs  Lab 09/01/24 2006 09/03/24 0436 09/05/24 0928 09/05/24 2028 09/06/24 0348  WBC 6.5 8.4 10.5 9.5 11.5*   NEUTROABS  --   --  9.1*  --   --   HGB 11.7* 13.8 14.1 13.3 12.9*  HCT 38.3* 44.7 47.6 42.9 41.4  MCV 96.7 95.1 99.2 95.1 95.8  PLT 207 235 237 248 251   Basic Metabolic Panel: Recent Labs  Lab 09/01/24 1407 09/01/24 2006 09/03/24 0436 09/05/24 0928 09/05/24 2028 09/06/24 0348  NA 140  --  139 141 142 141  K 4.7  --  4.1 4.7 4.3 4.1  CL 96*  --  92* 88* 89* 90*  CO2 40*  --  38* 44* 44* 41*  GLUCOSE 168*  --  155* 279* 180* 192*  BUN 16  --  16 29* 36* 44*  CREATININE 0.91 0.92 0.92 1.45* 1.54* 1.83*  CALCIUM  9.4  --  9.3 9.3 9.4 9.3  MG  --   --  2.2  --  2.1 2.0  PHOS  --   --  3.4  --  3.4  --    GFR: Estimated Creatinine Clearance: 55.5 mL/min (A) (by C-G formula based on SCr of 1.83 mg/dL (H)). Liver Function Tests: Recent Labs  Lab 09/03/24 0436 09/05/24  9071 09/05/24 2028  AST 26 22 20   ALT 18 21 19   ALKPHOS 75 88 72  BILITOT 1.0 0.7 0.6  PROT 7.3 8.2* 7.1  ALBUMIN 4.0 4.6 4.2   CBG: Recent Labs  Lab 09/05/24 2328 09/06/24 0350 09/06/24 0737 09/06/24 1116 09/06/24 1607  GLUCAP 152* 195* 166* 143* 271*    Recent Results (from the past 240 hours)  Resp panel by RT-PCR (RSV, Flu A&B, Covid) Nasal Mucosa     Status: None   Collection Time: 09/05/24 10:00 AM   Specimen: Nasal Mucosa; Nasal Swab  Result Value Ref Range Status   SARS Coronavirus 2 by RT PCR NEGATIVE NEGATIVE Final    Comment: (NOTE) SARS-CoV-2 target nucleic acids are NOT DETECTED.  The SARS-CoV-2 RNA is generally detectable in upper respiratory specimens during the acute phase of infection. The lowest concentration of SARS-CoV-2 viral copies this assay can detect is 138 copies/mL. A negative result does not preclude SARS-Cov-2 infection and should not be used as the sole basis for treatment or other patient management decisions. A negative result may occur with  improper specimen collection/handling, submission of specimen other than nasopharyngeal swab, presence of viral  mutation(s) within the areas targeted by this assay, and inadequate number of viral copies(<138 copies/mL). A negative result must be combined with clinical observations, patient history, and epidemiological information. The expected result is Negative.  Fact Sheet for Patients:  bloggercourse.com  Fact Sheet for Healthcare Providers:  seriousbroker.it  This test is no t yet approved or cleared by the [place]  FDA and  has been authorized for detection and/or diagnosis of SARS-CoV-2 by FDA under an Emergency Use Authorization (EUA). This EUA will remain  in effect (meaning this test can be used) for the duration of the COVID-19 declaration under Section 564(b)(1) of the Act, 21 U.S.C.section 360bbb-3(b)(1), unless the authorization is terminated  or revoked sooner.       Influenza A by PCR NEGATIVE NEGATIVE Final   Influenza B by PCR NEGATIVE NEGATIVE Final    Comment: (NOTE) The Xpert Xpress SARS-CoV-2/FLU/RSV plus assay is intended as an aid in the diagnosis of influenza from Nasopharyngeal swab specimens and should not be used as a sole basis for treatment. Nasal washings and aspirates are unacceptable for Xpert Xpress SARS-CoV-2/FLU/RSV testing.  Fact Sheet for Patients: bloggercourse.com  Fact Sheet for Healthcare Providers: seriousbroker.it  This test is not yet approved or cleared by the [place]  FDA and has been authorized for detection and/or diagnosis of SARS-CoV-2 by FDA under an Emergency Use Authorization (EUA). This EUA will remain in effect (meaning this test can be used) for the duration of the COVID-19 declaration under Section 564(b)(1) of the Act, 21 U.S.C. section 360bbb-3(b)(1), unless the authorization is terminated or revoked.     Resp Syncytial Virus by PCR NEGATIVE NEGATIVE Final    Comment: (NOTE) Fact Sheet for  Patients: bloggercourse.com  Fact Sheet for Healthcare Providers: seriousbroker.it  This test is not yet approved or cleared by the [place]  FDA and has been authorized for detection and/or diagnosis of SARS-CoV-2 by FDA under an Emergency Use Authorization (EUA). This EUA will remain in effect (meaning this test can be used) for the duration of the COVID-19 declaration under Section 564(b)(1) of the Act, 21 U.S.C. section 360bbb-3(b)(1), unless the authorization is terminated or revoked.  Performed at [facility], [address] [place], [place] [address]   MRSA Next Gen by PCR, Nasal     Status: None  Collection Time: 09/05/24 10:00 AM   Specimen: Nasal Mucosa; Nasal Swab  Result Value Ref Range Status   MRSA by PCR Next Gen NOT DETECTED NOT DETECTED Final    Comment: (NOTE) The GeneXpert MRSA Assay (FDA approved for NASAL specimens only), is one component of a comprehensive MRSA colonization surveillance program. It is not intended to diagnose MRSA infection nor to guide or monitor treatment for MRSA infections. Test performance is not FDA approved in patients less than 30 years old. Performed at [facility], [address], [place], [place] [address]      Radiology Studies: DG Chest Sims 1 View Result Date: 09/05/2024 EXAM: 1 VIEW XRAY OF THE CHEST 09/05/2024 09:17:00 AM COMPARISON: 09/01/2024 CLINICAL HISTORY: 69 year old male with hypercapnic respiratory failure (hepatocellular carcinoma). FINDINGS: LUNGS AND PLEURA: Low lung volumes. Linear opacity in left mid lung, and patchy left greater than right lung bases, stable compared to prior exam. No pleural effusion. No pneumothorax. HEART AND MEDIASTINUM: Cardiomegaly, stable. Aortic atherosclerosis. BONES AND SOFT TISSUES: No acute osseous abnormality. IMPRESSION: 1. Ongoing low lung volumes, atelectasis. Electronically signed by: Helayne Hurst MD MD  09/05/2024 09:28 AM EST RP Workstation: [id number]    Scheduled Meds:  aspirin   81 mg Oral Daily   atorvastatin   80 mg Oral Daily   Chlorhexidine  Gluconate Cloth  6 each Topical Daily   enoxaparin  (LOVENOX ) injection  65 mg Subcutaneous Q24H   insulin  aspart  0-20 Units Subcutaneous Q4H   ipratropium-albuterol   3 mL Nebulization Q6H   [START ON 09/07/2024] losartan   25 mg Oral Daily   methylPREDNISolone  (SOLU-MEDROL ) injection  40 mg Intravenous Q12H   mouth rinse  15 mL Mouth Rinse 4 times per day   spironolactone   25 mg Oral q morning   triamcinolone  0.1 % cream : eucerin   Topical TID   Continuous Infusions:   LOS: 5 days  MDM: Patient is high risk for one or more organ failure.  They necessitate ongoing hospitalization for continued IV therapies and subsequent lab monitoring. Total time spent interpreting labs and vitals, reviewing the medical record, coordinating care amongst consultants and care team members, directly assessing and discussing care with the patient and/or family: 55 min  Arthi Mcdonald, DO [facility]  To contact the attending physician between 7A-7P please use Epic Chat. To contact the covering physician during after hours 7P-7A, please review Amion.  09/06/2024, 4:51 PM   *This document has been created with the assistance of dictation software. Please excuse typographical errors. *   "

## 2024-09-06 NOTE — Plan of Care (Signed)

## 2024-09-07 DIAGNOSIS — I5033 Acute on chronic diastolic (congestive) heart failure: Secondary | ICD-10-CM | POA: Diagnosis not present

## 2024-09-07 LAB — BLOOD GAS, ARTERIAL
Acid-Base Excess: 22.4 mmol/L — ABNORMAL HIGH (ref 0.0–2.0)
Bicarbonate: 57.6 mmol/L — ABNORMAL HIGH (ref 20.0–28.0)
O2 Content: 3 L/min
O2 Saturation: 83.1 %
Patient temperature: 37
pCO2 arterial: >123 mmHg (ref 32–48)
pH, Arterial: 7.21 — ABNORMAL LOW (ref 7.35–7.45)
pO2, Arterial: 56 mmHg — ABNORMAL LOW (ref 83–108)

## 2024-09-07 LAB — CBC
HCT: 38.3 % — ABNORMAL LOW (ref 39.0–52.0)
Hemoglobin: 12.4 g/dL — ABNORMAL LOW (ref 13.0–17.0)
MCH: 29.7 pg (ref 26.0–34.0)
MCHC: 32.4 g/dL (ref 30.0–36.0)
MCV: 91.8 fL (ref 80.0–100.0)
Platelets: 236 K/uL (ref 150–400)
RBC: 4.17 MIL/uL — ABNORMAL LOW (ref 4.22–5.81)
RDW: 12.8 % (ref 11.5–15.5)
WBC: 9.7 K/uL (ref 4.0–10.5)
nRBC: 0 % (ref 0.0–0.2)

## 2024-09-07 LAB — BASIC METABOLIC PANEL WITH GFR
Anion gap: 10 (ref 5–15)
BUN: 64 mg/dL — ABNORMAL HIGH (ref 8–23)
CO2: 37 mmol/L — ABNORMAL HIGH (ref 22–32)
Calcium: 9.1 mg/dL (ref 8.9–10.3)
Chloride: 87 mmol/L — ABNORMAL LOW (ref 98–111)
Creatinine, Ser: 1.87 mg/dL — ABNORMAL HIGH (ref 0.61–1.24)
GFR, Estimated: 39 mL/min — ABNORMAL LOW
Glucose, Bld: 241 mg/dL — ABNORMAL HIGH (ref 70–99)
Potassium: 3.9 mmol/L (ref 3.5–5.1)
Sodium: 134 mmol/L — ABNORMAL LOW (ref 135–145)

## 2024-09-07 LAB — GLUCOSE, CAPILLARY
Glucose-Capillary: 240 mg/dL — ABNORMAL HIGH (ref 70–99)
Glucose-Capillary: 236 mg/dL — ABNORMAL HIGH (ref 70–99)
Glucose-Capillary: 265 mg/dL — ABNORMAL HIGH (ref 70–99)
Glucose-Capillary: 273 mg/dL — ABNORMAL HIGH (ref 70–99)
Glucose-Capillary: 197 mg/dL — ABNORMAL HIGH (ref 70–99)
Glucose-Capillary: 206 mg/dL — ABNORMAL HIGH (ref 70–99)

## 2024-09-07 MED ORDER — OXYCODONE-ACETAMINOPHEN 5-325 MG PO TABS
2.0000 | ORAL_TABLET | ORAL | Status: DC | PRN
  Administered 2024-09-07 – 2024-09-09 (×8): 2 via ORAL
  Filled 2024-09-07 (×8): qty 2

## 2024-09-07 MED ORDER — METHYLPREDNISOLONE SODIUM SUCC 40 MG IJ SOLR
40.0000 mg | Freq: Every day | INTRAMUSCULAR | Status: DC
  Administered 2024-09-08 – 2024-09-09 (×2): 40 mg via INTRAVENOUS
  Filled 2024-09-07 (×2): qty 1

## 2024-09-07 MED ORDER — LOSARTAN POTASSIUM 25 MG PO TABS
25.0000 mg | ORAL_TABLET | Freq: Every day | ORAL | Status: DC
  Administered 2024-09-08 – 2024-09-09 (×2): 25 mg via ORAL
  Filled 2024-09-07 (×2): qty 1

## 2024-09-07 NOTE — Progress Notes (Signed)
 [facility] CLINIC CARDIOLOGY PROGRESS NOTE   Patient ID: Cody Alexander MRN: [id number] DOB/AGE: 1955-11-05 69 y.o.  Admit date: 09/01/2024 Referring Physician Dr. Alexandra Dezii Primary Physician [facility]  Primary Cardiologist [facility]  Reason for Consultation AoCHF, severe LVH  HPI: Cody Alexander is a 69 y.o. male with a past medical history of chronic HFpEF, severe LVH, COPD with chronic hypoxic respiratory failure on home O2, hypertension, type 2 diabetes who presented to the ED on 09/01/2024 for chest discomfort, shortness of breath, leg swelling. Cardiology was consulted for further evaluation.   Interval History:  -Patient seen and examined this morning, resting in bed on high flow nasal cannula. - Feels his breathing is overall improved. Also states he feels his LE edema is better.  - BP has been stable.  Review of systems complete and found to be negative unless listed above   Vitals:   09/06/24 2120 09/06/24 2338 09/07/24 0801 09/07/24 0837  BP: 132/87 121/72  132/87  Pulse: 98 75  91  Resp: (!) 22 18 18 20   Temp: 98.1 F (36.7 C) 98 F (36.7 C)  98 F (36.7 C)  TempSrc:    Oral  SpO2: (!) 74% 96% 91% 92%  Weight:      Height:         Intake/Output Summary (Last 24 hours) at 09/07/2024 0931 Last data filed at 09/07/2024 9282 Gross per 24 hour  Intake 720 ml  Output 2000 ml  Net -1280 ml     PHYSICAL EXAM General: Chronically ill-appearing male, well nourished, in no acute distress. HEENT: Normocephalic and atraumatic. Neck: No JVD.  Lungs: Normal respiratory effort on HFNC. Clear bilaterally to auscultation. No wheezes, crackles, rhonchi.  Heart: HRRR. Normal S1 and S2 without gallops or murmurs. Radial & DP pulses 2+ bilaterally. Abdomen: Non-distended appearing.  Msk: Normal strength and tone for age. Extremities: No clubbing, cyanosis or edema.   Neuro: Alert and oriented X 3. Psych: Mood appropriate, affect congruent.     LABS: Basic Metabolic Panel: Recent Labs    09/05/24 2028 09/06/24 0348 09/07/24 0413  NA 142 141 134*  K 4.3 4.1 3.9  CL 89* 90* 87*  CO2 44* 41* 37*  GLUCOSE 180* 192* 241*  BUN 36* 44* 64*  CREATININE 1.54* 1.83* 1.87*  CALCIUM  9.4 9.3 9.1  MG 2.1 2.0  --   PHOS 3.4  --   --    Liver Function Tests: Recent Labs    09/05/24 0928 09/05/24 2028  AST 22 20  ALT 21 19  ALKPHOS 88 72  BILITOT 0.7 0.6  PROT 8.2* 7.1  ALBUMIN 4.6 4.2   No results for input(s): LIPASE, AMYLASE in the last 72 hours. CBC: Recent Labs    09/05/24 0928 09/05/24 2028 09/06/24 0348 09/07/24 0413  WBC 10.5   < > 11.5* 9.7  NEUTROABS 9.1*  --   --   --   HGB 14.1   < > 12.9* 12.4*  HCT 47.6   < > 41.4 38.3*  MCV 99.2   < > 95.8 91.8  PLT 237   < > 251 236   < > = values in this interval not displayed.   Cardiac Enzymes: No results for input(s): CKTOTAL, CKMB, CKMBINDEX, TROPONINIHS in the last 72 hours. BNP: No results for input(s): BNP in the last 72 hours. D-Dimer: No results for input(s): DDIMER in the last 72 hours. Hemoglobin A1C: No results for input(s): HGBA1C in the last 72 hours.  Fasting Lipid Panel: No results for input(s): CHOL, HDL, LDLCALC, TRIG, CHOLHDL, LDLDIRECT in the last 72 hours. Thyroid Function Tests: No results for input(s): TSH, T4TOTAL, T3FREE, THYROIDAB in the last 72 hours.  Invalid input(s): FREET3 Anemia Panel: No results for input(s): VITAMINB12, FOLATE, FERRITIN, TIBC, IRON, RETICCTPCT in the last 72 hours.  No results found.    ECHO 08/2024: 1. Left ventricular ejection fraction, by estimation, is 60 to 65%. The left ventricle has normal function. The left ventricle has no regional wall motion abnormalities. There is severe concentric left ventricular hypertrophy. Indeterminate diastolic filling due to E-A fusion. No SAM or LVOT gradient visualized.   2. Right ventricular systolic function  is normal. The right ventricular  size is normal.   3. The mitral valve is grossly normal. No evidence of mitral valve  regurgitation. No evidence of mitral stenosis.   4. The aortic valve has an indeterminant number of cusps. Aortic valve  regurgitation is not visualized. No aortic stenosis is present.   5. Aortic dilatation noted. There is mild dilatation of the ascending  aorta, measuring 41 mm.   6. The inferior vena cava is dilated in size with >50% respiratory  variability, suggesting right atrial pressure of 8 mmHg.   TELEMETRY (personally reviewed): sinus rhythm rate 70s  EKG (personally reviewed): Normal sinus rhythm first-degree AV block rate 78 bpm  DATA reviewed by me 09/07/2024: last 24h vitals tele labs imaging I/O, hospitalist progress note  Principal Problem:   Acute on chronic diastolic CHF (congestive heart failure) (HCC) Active Problems:   Essential hypertension   Elevated troponin   Left ankle pain   Syncope    ASSESSMENT AND PLAN: [name] is a 69 y.o. male with a past medical history of chronic HFpEF, severe LVH, COPD with chronic hypoxic respiratory failure on home O2, hypertension, type 2 diabetes who presented to the ED on 09/01/2024 for chest discomfort, shortness of breath, leg swelling. Cardiology was consulted for further evaluation.   # Acute hypercapnic respiratory failure # Acute on chronic HFpEF # Hypertension Patient presented with worsening cough and shortness of breath, treated for acute heart failure as well as hypercapnic respiratory failure.  Echo this admission with a EF 60 to 65%, no wall motion abnormalities, severe concentric LVH without obvious obstruction. - Further diuresis currently held. - Continue spironolactone  25 mg daily. Holding losartan  as Cr still up.  - Continue atorvastatin  80 mg daily, aspirin  81 mg daily. - Further management of respiratory failure as per primary team.  This patient's case was discussed and created  with Dr. Florencio and he is in agreement.  Signed:  Danita Bloch, PA-C  09/07/2024, 9:31 AM [facility] Cardiology

## 2024-09-07 NOTE — Inpatient Diabetes Management (Addendum)
 Inpatient Diabetes Program Recommendations  AACE/ADA: New Consensus Statement on Inpatient Glycemic Control (2015)  Target Ranges:  Prepandial:   less than 140 mg/dL      Peak postprandial:   less than 180 mg/dL (1-2 hours)      Critically ill patients:  140 - 180 mg/dL   Lab Results  Component Value Date   GLUCAP 236 (H) 09/07/2024   HGBA1C 7.6 (H) 09/01/2024    Review of Glycemic Control  Latest Reference Range & Units 09/06/24 07:37 09/06/24 11:16 09/06/24 16:07 09/06/24 20:43 09/06/24 23:33 09/07/24 03:12 09/07/24 08:36  Glucose-Capillary 70 - 99 mg/dL 833 (H) 856 (H) 728 (H) 226 (H) 223 (H) 240 (H) 236 (H)   Diabetes history: DM 2 Outpatient Diabetes medications: None Current orders for Inpatient glycemic control:  Novolog  0-20 units Q4 hours Solumedrol 40 mg Q12 hours A1c 7.6% on 1/7  Inpatient Diabetes Program Recommendations:    If steroid dose remains the same -   Add Novolog  3 units tid meal coverage if eating >50% of meals   Thanks,  Clotilda Bull RN, MSN, BC-ADM Inpatient Diabetes Coordinator Team Pager [phone] (8a-5p)

## 2024-09-07 NOTE — Evaluation (Signed)
 Occupational Therapy Evaluation Patient Details Name: Cody Alexander MRN: [id number] DOB: [birth year] Today's Date: 09/07/2024   History of Present Illness   70 year old male with diastolic CHF, COPD, chronic hypoxic respiratory failure on 3 L at baseline, type 2 diabetes, essential hypertension, peripheral neuropathy, who presents to the ED with a constellation of symptoms including midsternal chest pain, orthopnea, lower extremity edema, and ankle pain. MRI reveals partial tear of prior L achilles tendon injury. Per podiatry, to wear CAM boot with mobility.     Clinical Impressions Patient was seen for OT re-evaluation this date. Currently on 40 L HHFNC, demonstrates poor safety awareness and impulsivity throughout eval stating he wanted to walk without O2 and without CAM boot. Education provided throughout. Patient performed bed mobility with supervision, sit<>stand with min A x 2 , ambulated ~10 feet (limited by [employer]).  Patient presents with deficits in standing balance/tolerance, safety awareness and overall activity tolerance, affecting safe and optimal ADL completion. Patient is currently requiring mod A for LB self care tasks and min A for transfers/mobility.  Paient would benefit from skilled OT services to address noted impairments and functional limitations (see below for any additional details) in order to maximize safety and independence while minimizing future risk of falls, injury, and readmission. Anticipate the need for follow up OT services upon acute hospital DC.      If plan is discharge home, recommend the following:   A lot of help with walking and/or transfers;A lot of help with bathing/dressing/bathroom;Direct supervision/assist for medications management;Assistance with cooking/housework;Assist for transportation;Help with stairs or ramp for entrance     Functional Status Assessment   Patient has had a recent decline in their functional status and demonstrates  the ability to make significant improvements in function in a reasonable and predictable amount of time.     Equipment Recommendations   None recommended by OT     Recommendations for Other Services         Precautions/Restrictions   Precautions Precautions: Fall Recall of Precautions/Restrictions: Impaired Restrictions Weight Bearing Restrictions Per Provider Order: Yes LLE Weight Bearing Per Provider Order: Weight bearing as tolerated Other Position/Activity Restrictions: with CAM boot     Mobility Bed Mobility Overal bed mobility: Needs Assistance Bed Mobility: Supine to Sit     Supine to sit: Supervision          Transfers Overall transfer level: Needs assistance Equipment used: Rolling walker (2 wheels) Transfers: Sit to/from Stand             General transfer comment: used momentum, posterior LOB on one occurence, min A x 2 for sit<>stand, cues for safety throughout transfer process for body mechanics      Balance Overall balance assessment: Needs assistance Sitting-balance support: No upper extremity supported, Feet supported Sitting balance-Leahy Scale: Good     Standing balance support: Bilateral upper extremity supported, During functional activity, Reliant on assistive device for balance Standing balance-Leahy Scale: Fair                             ADL either performed or assessed with clinical judgement   ADL Overall ADL's : Needs assistance/impaired                     Lower Body Dressing: Moderate assistance Lower Body Dressing Details (indicate cue type and reason): able to don sock/shoe to RLE, unable to don boot without considerable assist  General ADL Comments: anticipate that patient will require mod A for LB self care tasks and set up A for UB tasks     Vision         Perception         Praxis         Pertinent Vitals/Pain Pain Assessment Pain Assessment: 0-10 Pain Score:  6  Pain Location: L foot Pain Descriptors / Indicators: Aching Pain Intervention(s): Monitored during session     Extremity/Trunk Assessment Upper Extremity Assessment Upper Extremity Assessment: Overall WFL for tasks assessed   Lower Extremity Assessment Lower Extremity Assessment: Defer to PT evaluation       Communication Communication Communication: No apparent difficulties   Cognition Arousal: Alert Behavior During Therapy: Impulsive Cognition: No family/caregiver present to determine baseline             OT - Cognition Comments: impulsive, decr attention span                 Following commands: Impaired Following commands impaired: Follows multi-step commands inconsistently, Follows one step commands with increased time     Cueing  General Comments   Cueing Techniques: Verbal cues;Gestural cues;Tactile cues;Visual cues      Exercises Other Exercises Other Exercises: Pt edu in falls prevention, role of acute OT, recommendations, need for CAM boot   Shoulder Instructions      Home Living Family/patient expects to be discharged to:: Private residence Living Arrangements: Non-relatives/Friends Available Help at Discharge: Available PRN/intermittently Type of Home: Mobile home Home Access: Stairs to enter Entrance Stairs-Number of Steps: 4-5 Entrance Stairs-Rails: Left Home Layout: One level     Bathroom Shower/Tub: Chief Strategy Officer: Standard Bathroom Accessibility: No   Home Equipment: Agricultural Consultant (2 wheels);Cane - single point   Additional Comments: floor concentrator, and porttable concentrator with battery that often dies during [employer] outings - per recent admission      Prior Functioning/Environment Prior Level of Function : Independent/Modified Independent;History of Falls (last six months);Needs assist             Mobility Comments: Pt reports limited walking due to weakness, use of RW. Pt reports multiple  falls ADLs Comments: Pt reports increased difficulty with ADL and IADL 2/2 weakness and falls, endorses O2 sometimes comes off at night, light meal prep, does NOT drive so has been paying friend to take him places. Uses weekly pill box but does endorse forgetting to take his medications.    OT Problem List: Decreased strength;Cardiopulmonary status limiting activity;Decreased safety awareness;Decreased activity tolerance;Impaired balance (sitting and/or standing);Decreased knowledge of use of DME or AE;Decreased cognition;Obesity;Decreased knowledge of precautions   OT Treatment/Interventions: Self-care/ADL training;Therapeutic exercise;Therapeutic activities;DME and/or AE instruction;Energy conservation;Patient/family education;Balance training      OT Goals(Current goals can be found in the care plan section)   Acute Rehab OT Goals Patient Stated Goal: to go home OT Goal Formulation: With patient Time For Goal Achievement: 09/21/24 Potential to Achieve Goals: Fair   OT Frequency:  Min 2X/week    Co-evaluation PT/OT/SLP Co-Evaluation/Treatment: Yes Reason for Co-Treatment: For patient/therapist safety;To address functional/ADL transfers;Necessary to address cognition/behavior during functional activity PT goals addressed during session: Mobility/safety with mobility;Balance;Proper use of DME OT goals addressed during session: ADL's and self-care;Proper use of Adaptive equipment and DME      AM-PAC OT 6 Clicks Daily Activity     Outcome Measure Help from another person eating meals?: None Help from another person taking care of personal grooming?: None  Help from another person toileting, which includes using toliet, bedpan, or urinal?: A Little Help from another person bathing (including washing, rinsing, drying)?: A Little Help from another person to put on and taking off regular upper body clothing?: None Help from another person to put on and taking off regular lower body  clothing?: A Little 6 Click Score: 21   End of Session Equipment Utilized During Treatment: Rolling walker (2 wheels);Oxygen Nurse Communication: Mobility status  Activity Tolerance: Patient tolerated treatment well Patient left: in bed;with call bell/phone within reach;with bed alarm set  OT Visit Diagnosis: Unsteadiness on feet (R26.81);Repeated falls (R29.6);Muscle weakness (generalized) (M62.81)                Time: 8643-8580 OT Time Calculation (min): 23 min Charges:  OT General Charges $OT Visit: 1 Visit OT Evaluation $OT Eval Moderate Complexity: 1 Mod  Rogers Clause, OT/L MSOT, 09/07/2024

## 2024-09-07 NOTE — Progress Notes (Signed)
 " PROGRESS NOTE    Cody Alexander  FMW:[id number] DOB: November 30, 1955 DOA: 09/01/2024 PCP: [facility]  Chief Complaint  Patient presents with   Chest Pain    Hospital Course:  Cody Alexander is a 69 year old male with diastolic CHF, COPD, chronic hypoxic respiratory failure on 3 L at baseline, type 2 diabetes, essential hypertension, peripheral neuropathy, who presents to the ED with a constellation of symptoms including midsternal chest pain, orthopnea, lower extremity edema, and ankle pain. In the ED proBNP 96, high-sensitivity troponin 66 with repeat 74.  Labs and vitals otherwise within normal limits. Chest CTA negative for PE, mild bibasilar atelectasis with mild patchy ground glass opacities in the lung bases bilaterally favoring edema. He was started on IV diuresis and admitted. She was doing better and was pending SNF placement. Rapid response on 1/11 for severe hypercapnia.  Patient was transferred to the ICU on BiPAP. Patient has improved significantly on BiPAP, tolerating heated high flow.  We are continuing to wean him off of heated high flow.  Subjective: No acute events overnight.  Patient is asking to remove the Foley catheter this morning and get up with physical therapy.  Objective: Vitals:   09/06/24 2338 09/07/24 0801 09/07/24 0837 09/07/24 1138  BP: 121/72  132/87 127/82  Pulse: 75  91 77  Resp: 18 18 20    Temp: 98 F (36.7 C)  98 F (36.7 C) 97.9 F (36.6 C)  TempSrc:   Oral   SpO2: 96% 91% 92% 96%  Weight:      Height:        Intake/Output Summary (Last 24 hours) at 09/07/2024 1319 Last data filed at 09/07/2024 1228 Gross per 24 hour  Intake 600 ml  Output 2950 ml  Net -2350 ml   Filed Weights   09/01/24 1923 09/05/24 0955  Weight: 136.1 kg 130.4 kg    Examination: General exam: Appears calm and comfortable, NAD  Respiratory system: Heated high flow, better aeration bilaterally.  Lung exam somewhat limited by body  habitus Cardiovascular system: S1 & S2 heard, RRR.  Neuro: Alert, oriented x3 Extremities: Bilateral feet and lower legs demonstrates skin thickening, flaking, debris  Assessment & Plan:  Principal Problem:   Acute on chronic diastolic CHF (congestive heart failure) (HCC) Active Problems:   Left ankle pain   Syncope   Elevated troponin   Essential hypertension    Acute on Chronic hypercapnic respiratory failure - 1/11 ABG with initial CO2 of 140s, pH 7.16 - Was moved to ICU on BiPAP, has had significant improvement now.  Tolerating heated high flow, continue to wean as able - CO2 downtrending on serial ABGs - Pulmonary critical care team following - Repeat CXR with some vascular congestion but no acutely worsened before - Continue with steroids, nebulizer therapies  Acute on chronic diastolic CHF - Has been on 40 IV twice daily, additional dose x 1 today - Was diuresing well, down 7.5 L since admission.  Appears to be dry.  Diuresis stopped -Continue to follow strict I's and O's, AKI developing - Echocardiogram: known severe left ventricular hypertrophy, no regional wall motion abnormalities, indeterminate diastolic filling. - Given severity of LVH have reached out to [facility] cardiology.  Patient follows with [facility] cardiology outpatient though it has been sometime since he has seen them - Previously was referred to heart failure team but has had difficulty making appointments due to potation's - Continue to titrate GDMT as tolerated  Urinary retention - Foley catheter placed while in  ICU.  Removed today and trial of void - Bladder scan every 4 hours.  Replace Foley catheter if requiring straight cath more than twice in 24 hours  Elevated troponin - On arrival.  Thought to be secondary to acute CHF.  Had no further chest pain - Low threshold to repeat troponin or EKG if chest pain recurs  - Chest pain is reproducible upon palpation - Echo without regional wall motion  abnormality.  Cardiology consult as above  CAD with prior PCI - Resume home meds. - Last LHC in 2019 - Does not appear he is currently taking medications, have started aspirin .  Cardiology consultation as well  Syncope - Monitor on telemetry - Orthostatic vitals: Negative  Left ankle pain Prior left Achilles tendon injury - Podiatry has been consulted.  No bony abnormality on plain film - MRI showing partial tear - Plan for conservative management for now.  Podiatry has ordered equalizer walking boot. - Patient is not a surgical candidate - PT/OT recommending SNF.  TOC consulted for these arrangements.  PT/OT reconsulted today given the patient is now out of ICU  Hypertension - Resume home meds, resume gradually.  COPD Chronic hypoxic respiratory failure - Home O2 3 L.  Severe COPD - Currently receiving treatment for COPD exacerbation with steroid therapy and nebulizers  Obstructive sleep apnea - Continue with CPAP at night - Suspect patient may also have some degree of OHS with severity of hypercapnia  Type 2 diabetes - A1c 7.6% - Continue sliding scale for now  Rash - Has been seen outpatient by dermatology.  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 Obesity Class II - Outpatient follow up for lifestyle modification and risk factor management  Generalized deconditioning - Deconditioning beyond his baseline secondary to new heel pain.  PT/OT recommending SNF,  DVT prophylaxis: Lovenox    Code Status: Full Code Disposition: Still on heated high flow.  Continue to wean.  Needs SNF at DC  Consultants:  Treatment Team:  Consulting Physician: Lennie Barter, DPM  Procedures:    Antimicrobials:   Anti-infectives (From admission, onward)    None       Data Reviewed: I have personally reviewed following labs and imaging studies CBC: Recent Labs  Lab 09/03/24 0436 09/05/24 0928 09/05/24 2028 09/06/24 0348 09/07/24 0413  WBC 8.4 10.5 9.5 11.5* 9.7  NEUTROABS  --  9.1*  --   --   --   HGB 13.8 14.1 13.3 12.9* 12.4*  HCT 44.7 47.6 42.9 41.4 38.3*  MCV 95.1 99.2 95.1 95.8 91.8  PLT 235 237 248 251 236   Basic Metabolic Panel: Recent Labs  Lab 09/03/24 0436 09/05/24 0928 09/05/24 2028 09/06/24 0348 09/07/24 0413  NA 139 141 142 141 134*  K 4.1 4.7 4.3 4.1 3.9  CL 92* 88* 89* 90* 87*  CO2 38* 44* 44* 41* 37*  GLUCOSE 155* 279* 180* 192* 241*  BUN 16 29* 36* 44* 64*  CREATININE 0.92 1.45* 1.54* 1.83* 1.87*  CALCIUM  9.3 9.3 9.4 9.3 9.1  MG 2.2  --  2.1 2.0  --   PHOS 3.4  --  3.4  --   --    GFR: Estimated Creatinine Clearance: 54.3 mL/min (A) (  by C-G formula based on SCr of 1.87 mg/dL (H)). Liver Function Tests: Recent Labs  Lab 09/03/24 0436 09/05/24 0928 09/05/24 2028  AST 26 22 20   ALT 18 21 19   ALKPHOS 75 88 72  BILITOT 1.0 0.7 0.6  PROT 7.3 8.2* 7.1  ALBUMIN 4.0 4.6 4.2   CBG: Recent Labs  Lab 09/06/24 2043 09/06/24 2333 09/07/24 0312 09/07/24 0836 09/07/24 1139  GLUCAP 226* 223* 240* 236* 265*    Recent Results (from the past 240 hours)  Resp panel by RT-PCR (RSV, Flu A&B, Covid) Nasal Mucosa     Status: None   Collection Time: 09/05/24 10:00 AM   Specimen: Nasal Mucosa; Nasal Swab  Result Value Ref Range Status   SARS Coronavirus 2 by RT PCR NEGATIVE NEGATIVE Final    Comment: (NOTE) SARS-CoV-2 target nucleic acids are NOT DETECTED.  The SARS-CoV-2 RNA is generally detectable in upper respiratory specimens during the acute phase of infection. The lowest concentration of SARS-CoV-2 viral copies this assay can detect is 138 copies/mL. A negative result does not preclude SARS-Cov-2 infection and should not be used as the sole basis  for treatment or other patient management decisions. A negative result may occur with  improper specimen collection/handling, submission of specimen other than nasopharyngeal swab, presence of viral mutation(s) within the areas targeted by this assay, and inadequate number of viral copies(<138 copies/mL). A negative result must be combined with clinical observations, patient history, and epidemiological information. The expected result is Negative.  Fact Sheet for Patients:  bloggercourse.com  Fact Sheet for Healthcare Providers:  seriousbroker.it  This test is no t yet approved or cleared by the [place]  FDA and  has been authorized for detection and/or diagnosis of SARS-CoV-2 by FDA under an Emergency Use Authorization (EUA). This EUA will remain  in effect (meaning this test can be used) for the duration of the COVID-19 declaration under Section 564(b)(1) of the Act, 21 U.S.C.section 360bbb-3(b)(1), unless the authorization is terminated  or revoked sooner.       Influenza A by PCR NEGATIVE NEGATIVE Final   Influenza B by PCR NEGATIVE NEGATIVE Final    Comment: (NOTE) The Xpert Xpress SARS-CoV-2/FLU/RSV plus assay is intended as an aid in the diagnosis of influenza from Nasopharyngeal swab specimens and should not be used as a sole basis for treatment. Nasal washings and aspirates are unacceptable for Xpert Xpress SARS-CoV-2/FLU/RSV testing.  Fact Sheet for Patients: bloggercourse.com  Fact Sheet for Healthcare Providers: seriousbroker.it  This test is not yet approved or cleared by the [place]  FDA and has been authorized for detection and/or diagnosis of SARS-CoV-2 by FDA under an Emergency Use Authorization (EUA). This EUA will remain in effect (meaning this test can be used) for the duration of the COVID-19 declaration under Section 564(b)(1) of the Act, 21  U.S.C. section 360bbb-3(b)(1), unless the authorization is terminated or revoked.     Resp Syncytial Virus by PCR NEGATIVE NEGATIVE Final    Comment: (NOTE) Fact Sheet for Patients: bloggercourse.com  Fact Sheet for Healthcare Providers: seriousbroker.it  This test is not yet approved or cleared by the [place]  FDA and has been authorized for detection and/or diagnosis of SARS-CoV-2 by FDA under an Emergency Use Authorization (EUA). This EUA will remain in effect (meaning this test can be used) for the duration of the COVID-19 declaration under Section 564(b)(1) of the Act, 21 U.S.C. section 360bbb-3(b)(1), unless the authorization is terminated or revoked.  Performed at [facility], [address] [place], [place] [address]   MRSA Next Gen by PCR, Nasal     Status: None   Collection Time: 09/05/24 10:00 AM   Specimen: Nasal Mucosa; Nasal Swab  Result Value Ref Range Status   MRSA by PCR Next Gen NOT DETECTED NOT DETECTED Final    Comment: (NOTE) The GeneXpert MRSA Assay (FDA approved for NASAL specimens only), is one component of a comprehensive MRSA colonization surveillance program. It is not intended to diagnose MRSA infection nor to guide or monitor treatment for MRSA infections. Test performance is not FDA approved in patients less than 73 years old. Performed at [facility], [address] [place], [place] [address]      Radiology Studies: No results found.   Scheduled Meds:  aspirin   81 mg Oral Daily   atorvastatin   80 mg Oral Daily   enoxaparin  (LOVENOX ) injection  65 mg Subcutaneous Q24H   insulin  aspart  0-20 Units Subcutaneous Q4H   ipratropium-albuterol   3 mL Nebulization Q6H   [START ON 09/08/2024] losartan   25 mg Oral Daily   methylPREDNISolone  (SOLU-MEDROL ) injection  40 mg Intravenous Q12H   mouth rinse  15 mL Mouth Rinse 4 times per day   spironolactone   25 mg Oral q  morning   triamcinolone  0.1 % cream : eucerin   Topical TID   Continuous Infusions:   LOS: 6 days  MDM: Patient is high risk for one or more organ failure.  They necessitate ongoing hospitalization for continued IV therapies and subsequent lab monitoring. Total time spent interpreting labs and vitals, reviewing the medical record, coordinating care amongst consultants and care team members, directly assessing and discussing care with the patient and/or family: 55 min  Arris Meyn, DO [facility]  To contact the attending physician between 7A-7P please use Epic Chat. To contact the covering physician during after hours 7P-7A, please review Amion.  09/07/2024, 1:19 PM   *This document has been created with the assistance of dictation software. Please excuse typographical errors. *   "

## 2024-09-07 NOTE — Progress Notes (Signed)
 "                                                                                                                                                                                               Palliative Care Progress Note, Assessment & Plan   [name]       Date: 09/07/2024 DOB: 01-05-1956  Age: 69 y.o. MRN#: [id number] Attending Physician: Dezii, Alexandra, DO Primary Care Physician: [facility] Admit Date: 09/01/2024  Subjective: Patient is sitting up in bed with heated high flow nasal cannula in place.  He is awake, alert, oriented x 4, acknowledges my presence, and is able to make his wishes known.  No family or friends present at bedside during my visit.  HPI: 69 y.o. male  with past medical history of CHF, COPD, chronic respiratory failure on 3L [place] at baseline, OSA, T2DM, HTN, CAD s/p PCI, peripheral neuropathy admitted from home on 09/01/2024 with chest pain (chronic in nature--moths) and increased shortness of breath.   Admitted and being treated for acute on chronic respiratory failure due to CHF and COPD exacerbation RRT called 1/11 AM due to being found obtunded with significantly elevated CO2 levels on ABG-->123, transferred to SD unit and placed on bipap   Noted recent admit to [facility] 9/13-9/19/2025 due to acute on chronic hypoxic and hypercapnic respiratory failure secondary to COPD exacerbation   Previous admissions in April 2025 and February 2025 for similar presentation   Of note, PMT has followed Mr. Seiber during his previous hospitalizations in 2024   Palliative medicine was consulted for assisting with goals of care conversations.   Following up today for discussion of goals and boundaries of care.   Summary of counseling/coordination of care: Chart review completed prior to meeting patient including:  -Labs: Glucose remains elevated at 265, 236, sodium is mildly decreased to 134 but patient mentating well, creatinine remains  elevated at 1.87 and GFR of 39 -Vital signs: Vital signs are all within normal limits with patient satting at 96% on heated high flow nasal cannula at this time -Progress notes: As per hospitalist note Dr. 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  He shares several stories of his past with me that he had shared with me yesterday.  He does not recall that he has already shared the stories with me. He is alert and oriented x 4, he is forgetful.  Symptoms assessed.  Patient endorses complaints of itchiness on his face back due to his rash.  Reviewed patient has Eucerin cream available.  He shares that he has seen several dermatologist and doctors over more than 2 years that have not been able to completely clear his rash.  Discussed that his ongoing chronic issue will likely need to be addressed in the outpatient setting.  However, for symptom management while hospitalized triamcinolone  can be utilized up to 3 times a day for topical relief.  He endorsed understanding and appreciation.  Patient denies headache, chest pain, N/V/D, and other acute issues at this time.  No adjustment to [facility] needed.  He is eager to work with physical and Occupational Therapy.  Conveyed that patient has both PT and OT ordered.  Discussed they will be working with him to increase mobility and functioning with ADLs.  Symptom burden is low.  Goals are clear. NOK decision maker is his ex-cousin Jon.   PMT will step back from daily visits and monitor the patient peripherally.  Please re-engage with  PMT if goals change, at patient/family's request, or if patient's health deteriorates during hospitalization.    Physical Exam Vitals reviewed.  Constitutional:      General: He is not in acute distress.    Appearance: He is obese.  HENT:     Head: Normocephalic.  Eyes:     Pupils: Pupils are equal, round, and reactive to light.  Cardiovascular:     Rate and Rhythm: Normal rate.  Pulmonary:     Effort: Pulmonary effort is normal.     Comments: HHFNC in place Skin:    General: Skin is warm and dry.     Findings: Rash present.  Neurological:     Mental Status: He is alert.  Psychiatric:        Mood and Affect: Mood normal. Mood is not anxious.        Behavior: Behavior normal. Behavior is not agitated.              Recommendations:   Full code and full scope remain Patient would never be accepting of trach/PEG Triamcinolone  to rash up to 3 times a day Follow-up outpatient for continued treatment of rash  I personally spent a total of 25 minutes in the care of the patient today including preparing to see the patient, getting/reviewing separately obtained history, counseling and educating, and documenting clinical information in the EHR.   Lamarr L. Arvid, DNP, FNP-BC Palliative Medicine Team   "

## 2024-09-07 NOTE — Plan of Care (Signed)
   Problem: Clinical Measurements: Goal: Ability to maintain clinical measurements within normal limits will improve Outcome: Progressing   Problem: Clinical Measurements: Goal: Will remain free from infection Outcome: Progressing   Problem: Clinical Measurements: Goal: Diagnostic test results will improve Outcome: Progressing

## 2024-09-07 NOTE — Progress Notes (Signed)
" °   09/07/24 2204  Notify: Charge Nurse/RN  Name of Charge Nurse/RN Notified Elenor RN  Provider Notification  Provider Name/Title Dr. Lawence  Date Provider Notified 09/07/24  Time Provider Notified 2243  Method of Notification Page  Notification Reason Red med refusal  Provider response No new orders  Date of Provider Response 09/07/24  Time of Provider Response 2250    "

## 2024-09-07 NOTE — Evaluation (Signed)
 Physical Therapy Re-Evaluation Patient Details Name: Cody Alexander MRN: [id number] DOB: 04-May-1956 Today's Date: 09/07/2024  History of Present Illness  69 year old male with diastolic CHF, COPD, chronic hypoxic respiratory failure on 3 L at baseline, type 2 diabetes, essential hypertension, peripheral neuropathy, who presents to the ED with a constellation of symptoms including midsternal chest pain, orthopnea, lower extremity edema, and ankle pain. MRI reveals partial tear of prior L achilles tendon injury. Per podiatry, to wear CAM boot with mobility.   Clinical Impression  Pt alert, eager for mobility. Displayed impulsivity and decreased safety awareness. He told this therapist he wanted to try to get up and ambulate without the O2 (on 40L via HHFNC) as well as trying to ambulate without his CAM boot. Educated on safety measures throughout session. Supervision for bed mobility, sit <> stand with minAX2 and RW. Two bouts of ambulation ~3ft total, limited by HHFNC lines. minAx2 for intermittent unsteadiness, especially without BUE support.  Overall the patient demonstrated deficits (see PT Problem List) that impede the patient's functional abilities, safety, and mobility and would benefit from skilled PT intervention.         If plan is discharge home, recommend the following: A lot of help with walking and/or transfers;A little help with bathing/dressing/bathroom;Assistance with cooking/housework;Assist for transportation;Help with stairs or ramp for entrance   Can travel by private vehicle   Yes    Equipment Recommendations Rolling walker (2 wheels);BSC/3in1  Recommendations for Other Services       Functional Status Assessment Patient has had a recent decline in their functional status and demonstrates the ability to make significant improvements in function in a reasonable and predictable amount of time.     Precautions / Restrictions Precautions Precautions: Fall Recall of  Precautions/Restrictions: Impaired Restrictions Weight Bearing Restrictions Per Provider Order: Yes LLE Weight Bearing Per Provider Order: Weight bearing as tolerated Other Position/Activity Restrictions: with CAM boot      Mobility  Bed Mobility Overal bed mobility: Needs Assistance Bed Mobility: Supine to Sit     Supine to sit: Supervision          Transfers Overall transfer level: Needs assistance Equipment used: Rolling walker (2 wheels) Transfers: Sit to/from Stand Sit to Stand: Contact guard assist, +2 safety/equipment, Min assist           General transfer comment: used momentum, posterior LOB on one occurence, min A x 2 for sit<>stand, cues for safety throughout transfer process for body mechanics    Ambulation/Gait Ambulation/Gait assistance: Min assist, Contact guard assist, +2 safety/equipment Gait Distance (Feet): 12 Feet Assistive device: Rolling walker (2 wheels)         General Gait Details: limited by [facility]  Stairs            Wheelchair Mobility     Tilt Bed    Modified Rankin (Stroke Patients Only)       Balance Overall balance assessment: Needs assistance Sitting-balance support: No upper extremity supported, Feet supported Sitting balance-Leahy Scale: Good     Standing balance support: Bilateral upper extremity supported, During functional activity, Reliant on assistive device for balance Standing balance-Leahy Scale: Fair Standing balance comment: a few instances of unsteadiness noted especially without BUE support                             Pertinent Vitals/Pain Pain Assessment Pain Assessment: 0-10 Pain Score: 6  Pain Location: L foot Pain Descriptors / Indicators: Aching,  Sore Pain Intervention(s): Limited activity within patient's tolerance, Monitored during session, Repositioned    Home Living Family/patient expects to be discharged to:: Private residence Living Arrangements:  Non-relatives/Friends Available Help at Discharge: Available PRN/intermittently Type of Home: Mobile home Home Access: Stairs to enter Entrance Stairs-Rails: Left Entrance Stairs-Number of Steps: 4-5   Home Layout: One level Home Equipment: Agricultural Consultant (2 wheels);Cane - single point Additional Comments: floor concentrator, and porttable concentrator with battery that often dies during [employer] outings - per recent admission    Prior Function Prior Level of Function : Independent/Modified Independent;History of Falls (last six months);Needs assist             Mobility Comments: Pt reports limited walking due to weakness, use of RW. Pt reports multiple falls ADLs Comments: Pt reports increased difficulty with ADL and IADL 2/2 weakness and falls, endorses O2 sometimes comes off at night, light meal prep, does NOT drive so has been paying friend to take him places. Uses weekly pill box but does endorse forgetting to take his medications.     Extremity/Trunk Assessment   Upper Extremity Assessment Upper Extremity Assessment: Overall WFL for tasks assessed    Lower Extremity Assessment Lower Extremity Assessment:  (able to lift against gravity, but difficulty noted with donning cam boot due in part to weak hip flexion)       Communication   Communication Communication: No apparent difficulties    Cognition Arousal: Alert Behavior During Therapy: Impulsive                             Following commands: Impaired Following commands impaired: Follows multi-step commands inconsistently, Follows one step commands with increased time     Cueing Cueing Techniques: Verbal cues, Gestural cues, Tactile cues, Visual cues     General Comments      Exercises     Assessment/Plan    PT Assessment Patient needs continued PT services  PT Problem List Decreased strength;Decreased balance;Decreased activity tolerance;Decreased mobility;Decreased coordination;Decreased  cognition;Decreased knowledge of use of DME;Decreased safety awareness;Decreased knowledge of precautions;Cardiopulmonary status limiting activity;Pain       PT Treatment Interventions DME instruction;Stair training;Gait training;Functional mobility training;Therapeutic activities;Therapeutic exercise;Balance training;Neuromuscular re-education;Patient/family education    PT Goals (Current goals can be found in the Care Plan section)  Acute Rehab PT Goals Patient Stated Goal: to go home PT Goal Formulation: With patient Time For Goal Achievement: 09/21/24 Potential to Achieve Goals: Fair    Frequency Min 2X/week     Co-evaluation PT/OT/SLP Co-Evaluation/Treatment: Yes Reason for Co-Treatment: For patient/therapist safety;To address functional/ADL transfers;Necessary to address cognition/behavior during functional activity PT goals addressed during session: Mobility/safety with mobility;Balance;Proper use of DME OT goals addressed during session: ADL's and self-care;Proper use of Adaptive equipment and DME       AM-PAC PT 6 Clicks Mobility  Outcome Measure Help needed turning from your back to your side while in a flat bed without using bedrails?: None Help needed moving from lying on your back to sitting on the side of a flat bed without using bedrails?: None Help needed moving to and from a bed to a chair (including a wheelchair)?: A Little Help needed standing up from a chair using your arms (e.g., wheelchair or bedside chair)?: A Little Help needed to walk in hospital room?: A Little Help needed climbing 3-5 steps with a railing? : A Lot 6 Click Score: 19    End of Session Equipment Utilized During  Treatment: Oxygen Activity Tolerance: Patient tolerated treatment well Patient left: with call bell/phone within reach;in chair;with chair alarm set Nurse Communication: Mobility status PT Visit Diagnosis: Unsteadiness on feet (R26.81);Muscle weakness (generalized)  (M62.81);Other abnormalities of gait and mobility (R26.89);History of falling (Z91.81);Difficulty in walking, not elsewhere classified (R26.2)    Time: 8643-8582 PT Time Calculation (min) (ACUTE ONLY): 21 min   Charges:   PT Evaluation $PT Re-evaluation: 1 Re-eval PT Treatments $Therapeutic Activity: 8-22 mins PT General Charges $$ ACUTE PT VISIT: 1 Visit         Doyal Shams PT, DPT 3:54 PM,09/07/2024

## 2024-09-07 NOTE — TOC Progression Note (Signed)
 Transition of Care [facility]) - Progression Note    Patient Details  Name: Cody Alexander MRN: [id number] Date of Birth: 26-Apr-1956  Transition of [facility]) CM/SW Contact  Shasta DELENA Daring, RN Phone Number: 09/07/2024, 1:31 PM  Clinical Narrative:    Readmission Risk assessment complete. Patient lives in trailer with 2 roommates. Calls friends for transportation to appointments or will call the daughter of one of his roommates for transportation. Can also call his health insurance company if he gives them 3 days notice. [facility]ses [facility] in [facility] for pharmacy. Denies problems affording medication. Has H H with [facility] including PT, OT and social work.  Plans to call friend, Dickey, for a ride home at discharge.  Confirmed servies with [facility]:  Nursing, PT and social work.  Expected Discharge Plan: Home w Home Health Services Barriers to Discharge: Continued Medical Work up               Expected Discharge Plan and Services     Post Acute Care Choice: Resumption of Svcs/PTA Provider Living arrangements for the past 2 months: Single Family Home                           HH Arranged: RN, PT, OT, Social Work [facility] Agency: [employer] [facility] Date [facility] Agency Contacted: 09/03/24   Representative spoke with at [facility] Agency: [place]   Social Drivers of Health (SDOH) Interventions SDOH Screenings   Food Insecurity: No Food Insecurity (09/03/2024)  Housing: Low Risk (09/03/2024)  Transportation Needs: No Transportation Needs (09/03/2024)  Utilities: Not At Risk (09/03/2024)  Alcohol Screen: Low Risk (07/23/2023)  Financial Resource Strain: Low Risk (07/23/2023)  Physical Activity: Inactive (09/11/2023)   Received from [facility]  Social Connections: Unknown (09/03/2024)  Stress: Stress Concern Present (09/11/2023)   Received from [facility]  Tobacco Use: Medium Risk (09/01/2024)  Health Literacy: Medium Risk (09/11/2023)   Received from [facility]    Readmission Risk  Interventions    09/07/2024    1:28 PM 09/29/2023    1:04 PM 07/23/2023    1:58 PM  Readmission Risk Prevention Plan  Transportation Screening Complete Complete Complete  PCP or Specialist Appt within 3-5 Days Complete Complete Complete  HRI or Home Care Consult Complete Complete Complete  Social Work Consult for Recovery Care Planning/Counseling  Complete Complete  Palliative Care Screening Not Applicable Not Applicable Not Applicable  Medication Review Oceanographer) Complete Complete Complete

## 2024-09-08 DIAGNOSIS — R7989 Other specified abnormal findings of blood chemistry: Secondary | ICD-10-CM | POA: Diagnosis not present

## 2024-09-08 DIAGNOSIS — R0602 Shortness of breath: Secondary | ICD-10-CM | POA: Diagnosis not present

## 2024-09-08 DIAGNOSIS — J189 Pneumonia, unspecified organism: Secondary | ICD-10-CM | POA: Diagnosis not present

## 2024-09-08 DIAGNOSIS — I10 Essential (primary) hypertension: Secondary | ICD-10-CM | POA: Diagnosis not present

## 2024-09-08 DIAGNOSIS — I5033 Acute on chronic diastolic (congestive) heart failure: Secondary | ICD-10-CM | POA: Diagnosis not present

## 2024-09-08 DIAGNOSIS — M25572 Pain in left ankle and joints of left foot: Secondary | ICD-10-CM | POA: Diagnosis not present

## 2024-09-08 LAB — GLUCOSE, CAPILLARY
Glucose-Capillary: 207 mg/dL — ABNORMAL HIGH (ref 70–99)
Glucose-Capillary: 172 mg/dL — ABNORMAL HIGH (ref 70–99)
Glucose-Capillary: 305 mg/dL — ABNORMAL HIGH (ref 70–99)
Glucose-Capillary: 307 mg/dL — ABNORMAL HIGH (ref 70–99)

## 2024-09-08 LAB — CBC WITH DIFFERENTIAL/PLATELET
Abs Immature Granulocytes: 0.03 K/uL (ref 0.00–0.07)
Basophils Absolute: 0 K/uL (ref 0.0–0.1)
Basophils Relative: 0 %
Eosinophils Absolute: 0 K/uL (ref 0.0–0.5)
Eosinophils Relative: 0 %
HCT: 37.7 % — ABNORMAL LOW (ref 39.0–52.0)
Hemoglobin: 12.4 g/dL — ABNORMAL LOW (ref 13.0–17.0)
Immature Granulocytes: 0 %
Lymphocytes Relative: 9 %
Lymphs Abs: 0.8 K/uL (ref 0.7–4.0)
MCH: 29.5 pg (ref 26.0–34.0)
MCHC: 32.9 g/dL (ref 30.0–36.0)
MCV: 89.8 fL (ref 80.0–100.0)
Monocytes Absolute: 0.7 K/uL (ref 0.1–1.0)
Monocytes Relative: 8 %
Neutro Abs: 7.5 K/uL (ref 1.7–7.7)
Neutrophils Relative %: 83 %
Platelets: 234 K/uL (ref 150–400)
RBC: 4.2 MIL/uL — ABNORMAL LOW (ref 4.22–5.81)
RDW: 12.8 % (ref 11.5–15.5)
WBC: 9.1 K/uL (ref 4.0–10.5)
nRBC: 0 % (ref 0.0–0.2)

## 2024-09-08 LAB — COMPREHENSIVE METABOLIC PANEL WITH GFR
ALT: 11 U/L (ref 0–44)
AST: 16 U/L (ref 15–41)
Albumin: 3.9 g/dL (ref 3.5–5.0)
Alkaline Phosphatase: 57 U/L (ref 38–126)
Anion gap: 9 (ref 5–15)
BUN: 56 mg/dL — ABNORMAL HIGH (ref 8–23)
CO2: 35 mmol/L — ABNORMAL HIGH (ref 22–32)
Calcium: 9.5 mg/dL (ref 8.9–10.3)
Chloride: 90 mmol/L — ABNORMAL LOW (ref 98–111)
Creatinine, Ser: 1.47 mg/dL — ABNORMAL HIGH (ref 0.61–1.24)
GFR, Estimated: 52 mL/min — ABNORMAL LOW
Glucose, Bld: 207 mg/dL — ABNORMAL HIGH (ref 70–99)
Potassium: 3.7 mmol/L (ref 3.5–5.1)
Sodium: 134 mmol/L — ABNORMAL LOW (ref 135–145)
Total Bilirubin: 0.5 mg/dL (ref 0.0–1.2)
Total Protein: 6.7 g/dL (ref 6.5–8.1)

## 2024-09-08 LAB — BASIC METABOLIC PANEL WITH GFR
Anion gap: 10 (ref 5–15)
BUN: 56 mg/dL — ABNORMAL HIGH (ref 8–23)
CO2: 33 mmol/L — ABNORMAL HIGH (ref 22–32)
Calcium: 9.4 mg/dL (ref 8.9–10.3)
Chloride: 91 mmol/L — ABNORMAL LOW (ref 98–111)
Creatinine, Ser: 1.42 mg/dL — ABNORMAL HIGH (ref 0.61–1.24)
GFR, Estimated: 54 mL/min — ABNORMAL LOW
Glucose, Bld: 217 mg/dL — ABNORMAL HIGH (ref 70–99)
Potassium: 3.6 mmol/L (ref 3.5–5.1)
Sodium: 133 mmol/L — ABNORMAL LOW (ref 135–145)

## 2024-09-08 LAB — CBC
HCT: 40.7 % (ref 39.0–52.0)
Hemoglobin: 13.2 g/dL (ref 13.0–17.0)
MCH: 29.3 pg (ref 26.0–34.0)
MCHC: 32.4 g/dL (ref 30.0–36.0)
MCV: 90.2 fL (ref 80.0–100.0)
Platelets: 249 K/uL (ref 150–400)
RBC: 4.51 MIL/uL (ref 4.22–5.81)
RDW: 12.9 % (ref 11.5–15.5)
WBC: 11 K/uL — ABNORMAL HIGH (ref 4.0–10.5)
nRBC: 0 % (ref 0.0–0.2)

## 2024-09-08 MED ORDER — IPRATROPIUM-ALBUTEROL 0.5-2.5 (3) MG/3ML IN SOLN
3.0000 mL | Freq: Three times a day (TID) | RESPIRATORY_TRACT | Status: DC
  Administered 2024-09-08 – 2024-09-09 (×3): 3 mL via RESPIRATORY_TRACT
  Filled 2024-09-08 (×3): qty 3

## 2024-09-08 MED ORDER — MORPHINE SULFATE (PF) 2 MG/ML IV SOLN
1.0000 mg | INTRAVENOUS | Status: DC | PRN
  Administered 2024-09-09 (×2): 1 mg via INTRAVENOUS
  Filled 2024-09-08 (×2): qty 1

## 2024-09-08 MED ORDER — INSULIN ASPART 100 UNIT/ML IJ SOLN
3.0000 [IU] | Freq: Three times a day (TID) | INTRAMUSCULAR | Status: DC
  Administered 2024-09-08 – 2024-09-09 (×2): 3 [IU] via SUBCUTANEOUS
  Filled 2024-09-08: qty 3

## 2024-09-08 NOTE — Progress Notes (Signed)
 " PROGRESS NOTE    Cody Alexander  FMW:[id number] DOB: 11-Feb-1956 DOA: 09/01/2024 PCP: [facility]  Chief Complaint  Patient presents with   Chest Pain    Hospital Course:  Cody Alexander is a 69 year old male with diastolic CHF, COPD, chronic hypoxic respiratory failure on 3 L at baseline, type 2 diabetes, essential hypertension, peripheral neuropathy, who presents to the ED with a constellation of symptoms including midsternal chest pain, orthopnea, lower extremity edema, and ankle pain. In the ED proBNP 96, high-sensitivity troponin 66 with repeat 74.  Labs and vitals otherwise within normal limits. Chest CTA negative for PE, mild bibasilar atelectasis with mild patchy ground glass opacities in the lung bases bilaterally favoring edema. He was started on IV diuresis and admitted. She was doing better and was pending SNF placement. Rapid response on 1/11 for severe hypercapnia.  Patient was transferred to the ICU on BiPAP. Patient has improved significantly on BiPAP, tolerating heated high flow.    1/14 -weaning efforts to get him off of heated high flow, will start weaning off his narcotics  Subjective: Seems somewhat upset with another patient in the same room Asking that he could take shower.  He also wants to go home but recognizes that he is still requiring good amount of oxygen.  He is overall feeling better  Objective: Vitals:   09/08/24 0855 09/08/24 1247 09/08/24 1354 09/08/24 1434  BP: 115/70 (!) 150/95    Pulse: 80 86    Resp: 16 18    Temp: 98.1 F (36.7 C) 98 F (36.7 C)    TempSrc: Oral Oral    SpO2: 94% 95% 94% 93%  Weight:      Height:        Intake/Output Summary (Last 24 hours) at 09/08/2024 1543 Last data filed at 09/08/2024 1419 Gross per 24 hour  Intake 720 ml  Output 2925 ml  Net -2205 ml   Filed Weights   09/01/24 1923 09/05/24 0955  Weight: 136.1 kg 130.4 kg    Examination: General exam: Appears calm and comfortable,  NAD  Respiratory system: Heated high flow, better aeration bilaterally.  Lung exam somewhat limited by body habitus Cardiovascular system: S1 & S2 heard, RRR.  Neuro: Alert, oriented x3 Extremities: Bilateral feet and lower legs demonstrates skin thickening, flaking, debris  Assessment & Plan:  Principal Problem:   Acute on chronic diastolic CHF (congestive heart failure) (HCC) Active Problems:   Left ankle pain   Syncope   Elevated troponin   Essential hypertension    Acute on Chronic hypercapnic respiratory failure - 1/11 ABG with initial CO2 of 140s, pH 7.16 - Was moved to ICU on BiPAP, has had significant improvement now.  Tolerating heated high flow, continue to wean as able.  Currently on 6 L high flow nasal cannula - Pulmonary seen while he was in ICU. - Repeat CXR with some vascular congestion but no acutely worsened before - Continue with steroids, nebulizer therapies  Acute on chronic diastolic CHF - Has been on 40 IV twice daily, which has been stopped - Diuresed well, down 13 L since admission.  Appears to be dry.  Diuresis stopped -Continue to follow strict I's and O's - Echocardiogram: known severe left ventricular hypertrophy, no regional wall motion abnormalities, indeterminate diastolic filling. - Followed by [facility] cardiology while here.  Patient follows with [facility] cardiology outpatient though it has been sometime since he has seen them - Previously was referred to heart failure team but has had  difficulty making appointments - Continue to titrate GDMT as tolerated  Acute urinary retention - Foley catheter placed while in ICU.  Foley catheter removed on 1/13  Elevated troponin - On arrival.  Thought to be secondary to acute CHF.  Had no further chest pain - Low threshold to repeat troponin or EKG if chest pain recurs  - Chest pain is reproducible upon palpation - Echo without regional wall motion abnormality.  Cardiology following  CAD with prior PCI -  Resume home meds. - Last LHC in 2019 - Does not appear he is currently taking medications, have started aspirin .  Cardiology following  Syncope - Monitor on telemetry - Orthostatic vitals: Negative  Left ankle pain Prior left Achilles tendon injury - Podiatry seen.  No bony abnormality on plain film - MRI showing partial tear - Plan for conservative management for now.  Podiatry has ordered equalizer walking boot. - Patient is not a surgical candidate - PT/OT recommending SNF.  TOC consulted for these arrangements.  PT/OT recommends SNF but patient refuses  Hypertension - Continue losartan , spironolactone , as needed hydralazine   COPD Chronic hypoxic respiratory failure - Home O2 3 L.  Severe COPD - Currently receiving treatment for COPD exacerbation with steroid therapy and nebulizers  Obstructive sleep apnea - Continue with CPAP at night - Suspect patient may also have some degree of OHS with severity of hypercapnia  Type 2 diabetes - A1c 7.6% - Continue sliding scale for now  Rash - Has been seen outpatient by dermatology.  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 Obesity Class II - Outpatient follow up for lifestyle modification and risk factor management  Generalized deconditioning - Deconditioning beyond his baseline secondary to new heel pain.  PT/OT recommending SNF, but is still refusing  DVT prophylaxis: Lovenox    Code Status: Full Code Disposition: Still on heated high flow.  Continue to wean.  Will benefit SNF at DC but patient is refusing  Consultants:  Treatment Team:  Consulting Physician: Lennie Barter, DPM  Procedures:    Antimicrobials:  Anti-infectives (From admission, onward)    None        Data Reviewed: I have personally reviewed following labs and imaging studies CBC: Recent Labs  Lab 09/05/24 0928 09/05/24 2028 09/06/24 0348 09/07/24 0413 09/07/24 2334 09/08/24 0449  WBC 10.5 9.5 11.5* 9.7 9.1 11.0*  NEUTROABS 9.1*  --   --   --  7.5  --   HGB 14.1 13.3 12.9* 12.4* 12.4* 13.2  HCT 47.6 42.9 41.4 38.3* 37.7* 40.7  MCV 99.2 95.1 95.8 91.8 89.8 90.2  PLT 237 248 251 236 234 249   Basic Metabolic Panel: Recent Labs  Lab 09/03/24 0436 09/05/24 0928 09/05/24 2028 09/06/24 0348 09/07/24 0413 09/07/24 2334 09/08/24 0449  NA 139   < > 142 141 134* 134* 133*  K 4.1   < > 4.3 4.1 3.9 3.7 3.6  CL 92*   < > 89* 90* 87* 90* 91*  CO2 38*   < > 44* 41* 37* 35* 33*  GLUCOSE 155*   < > 180* 192* 241* 207* 217*  BUN 16   < > 36* 44* 64* 56* 56*  CREATININE 0.92   < >  1.54* 1.83* 1.87* 1.47* 1.42*  CALCIUM  9.3   < > 9.4 9.3 9.1 9.5 9.4  MG 2.2  --  2.1 2.0  --   --   --   PHOS 3.4  --  3.4  --   --   --   --    < > = values in this interval not displayed.   GFR: Estimated Creatinine Clearance: 71.5 mL/min (A) (by C-G formula based on SCr of 1.42 mg/dL (H)). Liver Function Tests: Recent Labs  Lab 09/03/24 0436 09/05/24 0928 09/05/24 2028 09/07/24 2334  AST 26 22 20 16   ALT 18 21 19 11   ALKPHOS 75 88 72 57  BILITOT 1.0 0.7 0.6 0.5  PROT 7.3 8.2* 7.1 6.7  ALBUMIN 4.0 4.6 4.2 3.9   CBG: Recent Labs  Lab 09/07/24 1948 09/07/24 2308 09/08/24 0411 09/08/24 0833 09/08/24 1302  GLUCAP 197* 206* 207* 172* 305*    Recent Results (from the past 240 hours)  Resp panel by RT-PCR (RSV, Flu A&B, Covid) Nasal Mucosa     Status: None   Collection Time: 09/05/24 10:00 AM   Specimen: Nasal Mucosa; Nasal Swab  Result Value Ref Range Status   SARS Coronavirus 2 by RT PCR NEGATIVE NEGATIVE Final    Comment: (NOTE) SARS-CoV-2 target nucleic acids are NOT DETECTED.  The SARS-CoV-2 RNA is generally detectable in upper respiratory specimens during the acute  phase of infection. The lowest concentration of SARS-CoV-2 viral copies this assay can detect is 138 copies/mL. A negative result does not preclude SARS-Cov-2 infection and should not be used as the sole basis for treatment or other patient management decisions. A negative result may occur with  improper specimen collection/handling, submission of specimen other than nasopharyngeal swab, presence of viral mutation(s) within the areas targeted by this assay, and inadequate number of viral copies(<138 copies/mL). A negative result must be combined with clinical observations, patient history, and epidemiological information. The expected result is Negative.  Fact Sheet for Patients:  bloggercourse.com  Fact Sheet for Healthcare Providers:  seriousbroker.it  This test is no t yet approved or cleared by the [place]  FDA and  has been authorized for detection and/or diagnosis of SARS-CoV-2 by FDA under an Emergency Use Authorization (EUA). This EUA will remain  in effect (meaning this test can be used) for the duration of the COVID-19 declaration under Section 564(b)(1) of the Act, 21 U.S.C.section 360bbb-3(b)(1), unless the authorization is terminated  or revoked sooner.       Influenza A by PCR NEGATIVE NEGATIVE Final   Influenza B by PCR NEGATIVE NEGATIVE Final    Comment: (NOTE) The Xpert Xpress SARS-CoV-2/FLU/RSV plus assay is intended as an aid in the diagnosis of influenza from Nasopharyngeal swab specimens and should not be used as a sole basis for treatment. Nasal washings and aspirates are unacceptable for Xpert Xpress SARS-CoV-2/FLU/RSV testing.  Fact Sheet for Patients: bloggercourse.com  Fact Sheet for Healthcare Providers: seriousbroker.it  This test is not yet approved or cleared by the [place]  FDA and has been authorized for detection and/or diagnosis of  SARS-CoV-2 by FDA under an Emergency Use Authorization (EUA). This EUA will remain in effect (meaning this test can be used) for the duration of the COVID-19 declaration under Section 564(b)(1) of the Act, 21 U.S.C. section 360bbb-3(b)(1), unless the authorization is terminated or revoked.     Resp Syncytial Virus by PCR NEGATIVE NEGATIVE Final    Comment: (NOTE) Fact Sheet for Patients: bloggercourse.com  Fact  Sheet for Healthcare Providers: seriousbroker.it  This test is not yet approved or cleared by the [place]  FDA and has been authorized for detection and/or diagnosis of SARS-CoV-2 by FDA under an Emergency Use Authorization (EUA). This EUA will remain in effect (meaning this test can be used) for the duration of the COVID-19 declaration under Section 564(b)(1) of the Act, 21 U.S.C. section 360bbb-3(b)(1), unless the authorization is terminated or revoked.  Performed at [facility], [address] [place], [place] [address]   MRSA Next Gen by PCR, Nasal     Status: None   Collection Time: 09/05/24 10:00 AM   Specimen: Nasal Mucosa; Nasal Swab  Result Value Ref Range Status   MRSA by PCR Next Gen NOT DETECTED NOT DETECTED Final    Comment: (NOTE) The GeneXpert MRSA Assay (FDA approved for NASAL specimens only), is one component of a comprehensive MRSA colonization surveillance program. It is not intended to diagnose MRSA infection nor to guide or monitor treatment for MRSA infections. Test performance is not FDA approved in patients less than 37 years old. Performed at [facility], [address] [place], [place] [address]      Radiology Studies: No results found.   Scheduled Meds:  aspirin   81 mg Oral Daily   atorvastatin   80 mg Oral Daily   enoxaparin  (LOVENOX ) injection  65 mg Subcutaneous Q24H   insulin  aspart  0-20 Units Subcutaneous Q4H   insulin  aspart  3 Units Subcutaneous  TID WC   ipratropium-albuterol   3 mL Nebulization TID   losartan   25 mg Oral Daily   methylPREDNISolone  (SOLU-MEDROL ) injection  40 mg Intravenous Daily   mouth rinse  15 mL Mouth Rinse 4 times per day   spironolactone   25 mg Oral q morning   triamcinolone  0.1 % cream : eucerin   Topical TID   Time spent 35 minutes   LOS: 7 days  MDM: Patient is high risk for one or more organ failure.  They necessitate ongoing hospitalization for continued IV therapies and subsequent lab monitoring. Total time spent interpreting labs and vitals, reviewing the medical record, coordinating care amongst consultants and care team members, directly assessing and discussing care with the patient and/or family: 55 min  Penny Frisbie Maree, MD [facility]  To contact the attending physician between 7A-7P please use Epic Chat. To contact the covering physician during after hours 7P-7A, please review Amion.  09/08/2024, 3:43 PM   *This document has been created with the assistance of dictation software. Please excuse typographical errors. *   "

## 2024-09-08 NOTE — Plan of Care (Signed)
"                                                                                                                                            °                                                   °  Palliative Care Progress Note   [name]       Date: 09/08/2024 DOB: May 27, 1956  Age: 69 y.o. MRN#: [id number] Attending Physician: Maree Hue, MD Primary Care Physician: [facility] Admit Date: 09/01/2024  Chart reviewed.  TOC note, patient has refused SNF placement and would like to continue with home health services.  Goals remain clear.  Full code and full scope.  No acute palliative needs requiring in person visit at this time.  PMT will continue to follow the patient peripherally, shadow his chart, and reengage where appropriate.  Thank you for allowing the Palliative Medicine Team to assist in the care of Cody Alexander.  Lamarr L. Arvid, DNP, FNP-BC Palliative Medicine Team  No charge    "

## 2024-09-08 NOTE — Progress Notes (Signed)
 Mobility assessment: Patient stood at bedside with front wheel walker. Marched in place 20 times with ease. Side steps with ease. Ambulation short distance with ease. Patient is level 4 on mobility scale.

## 2024-09-08 NOTE — Plan of Care (Signed)

## 2024-09-08 NOTE — TOC Progression Note (Signed)
 Transition of Care [facility]) - Progression Note    Patient Details  Name: Cody Alexander MRN: [id number] Date of Birth: Sep 10, 1955  Transition of Care [facility]) CM/SW Contact  Shasta DELENA Daring, RN Phone Number: 09/08/2024, 12:17 PM  Clinical Narrative:    RNCM met with patient again. Advised that medical team is recommending he discharge to a skilled nursing facility for short term rehab. Patient refused SNF placement. He remains agreeable to [facility] services. Patient already has services of PT, nursing and social work with [facility]. He chooses to continue those services at discharge.     Expected Discharge Plan: Home w Home Health Services Barriers to Discharge: Continued Medical Work up               Expected Discharge Plan and Services     Post Acute Care Choice: Resumption of Svcs/PTA Provider Living arrangements for the past 2 months: Single Family Home                           HH Arranged: RN, PT, OT, Social Work [facility] Agency: [facility] Date [facility] Agency Contacted: 09/03/24   Representative spoke with at [facility] Agency: [place]   Social Drivers of Health (SDOH) Interventions SDOH Screenings   Food Insecurity: No Food Insecurity (09/03/2024)  Housing: Low Risk (09/03/2024)  Transportation Needs: No Transportation Needs (09/03/2024)  Utilities: Not At Risk (09/03/2024)  Alcohol Screen: Low Risk (07/23/2023)  Financial Resource Strain: Low Risk (07/23/2023)  Physical Activity: Inactive (09/11/2023)   Received from [facility]  Social Connections: Unknown (09/03/2024)  Stress: Stress Concern Present (09/11/2023)   Received from [facility]  Tobacco Use: Medium Risk (09/01/2024)  Health Literacy: Medium Risk (09/11/2023)   Received from [facility]    Readmission Risk Interventions    09/07/2024    1:28 PM 09/29/2023    1:04 PM 07/23/2023    1:58 PM  Readmission Risk Prevention Plan  Transportation Screening Complete Complete Complete  PCP or Specialist  Appt within 3-5 Days Complete Complete Complete  HRI or Home Care Consult Complete Complete Complete  Social Work Consult for Recovery Care Planning/Counseling  Complete Complete  Palliative Care Screening Not Applicable Not Applicable Not Applicable  Medication Review Oceanographer) Complete Complete Complete

## 2024-09-08 NOTE — Progress Notes (Signed)
 [facility] CLINIC CARDIOLOGY PROGRESS NOTE   Patient ID: Cody Alexander MRN: [id number] DOB/AGE: [birth year] 68 y.o.  Admit date: 09/01/2024 Referring Physician Dr. Alexandra Dezii Primary Physician [facility]  Primary Cardiologist [facility]  Reason for Consultation AoCHF, severe LVH  HPI: Cody Alexander is a 69 y.o. male with a past medical history of chronic HFpEF, severe LVH, COPD with chronic hypoxic respiratory failure on home O2, hypertension, type 2 diabetes who presented to the ED on 09/01/2024 for chest discomfort, shortness of breath, leg swelling. Cardiology was consulted for further evaluation.   Interval History:  -Patient seen and examined this morning, resting in bed on high flow nasal cannula.  - Continues to report improvement in SOB. Ready to work with PT more.  - BP has been stable.  Review of systems complete and found to be negative unless listed above   Vitals:   09/08/24 0635 09/08/24 0731 09/08/24 0800 09/08/24 0855  BP: 100/66  116/82 115/70  Pulse: 62  82 80  Resp: 18  18 16   Temp:   98 F (36.7 C) 98.1 F (36.7 C)  TempSrc:   Oral Oral  SpO2: 95% 96% 94% 94%  Weight:      Height:         Intake/Output Summary (Last 24 hours) at 09/08/2024 0900 Last data filed at 09/08/2024 9356 Gross per 24 hour  Intake 600 ml  Output 3525 ml  Net -2925 ml     PHYSICAL EXAM General: Chronically ill-appearing male, well nourished, in no acute distress. HEENT: Normocephalic and atraumatic. Neck: No JVD.  Lungs: Normal respiratory effort on HFNC. Clear bilaterally to auscultation. No wheezes, crackles, rhonchi.  Heart: HRRR. Normal S1 and S2 without gallops or murmurs. Radial & DP pulses 2+ bilaterally. Abdomen: Non-distended appearing.  Msk: Normal strength and tone for age. Extremities: No clubbing, cyanosis or edema.   Neuro: Alert and oriented X 3. Psych: Mood appropriate, affect congruent.    LABS: Basic Metabolic Panel: Recent Labs     09/05/24 2028 09/06/24 0348 09/07/24 0413 09/07/24 2334 09/08/24 0449  NA 142 141   < > 134* 133*  K 4.3 4.1   < > 3.7 3.6  CL 89* 90*   < > 90* 91*  CO2 44* 41*   < > 35* 33*  GLUCOSE 180* 192*   < > 207* 217*  BUN 36* 44*   < > 56* 56*  CREATININE 1.54* 1.83*   < > 1.47* 1.42*  CALCIUM  9.4 9.3   < > 9.5 9.4  MG 2.1 2.0  --   --   --   PHOS 3.4  --   --   --   --    < > = values in this interval not displayed.   Liver Function Tests: Recent Labs    09/05/24 2028 09/07/24 2334  AST 20 16  ALT 19 11  ALKPHOS 72 57  BILITOT 0.6 0.5  PROT 7.1 6.7  ALBUMIN 4.2 3.9   No results for input(s): LIPASE, AMYLASE in the last 72 hours. CBC: Recent Labs    09/05/24 0928 09/05/24 2028 09/07/24 2334 09/08/24 0449  WBC 10.5   < > 9.1 11.0*  NEUTROABS 9.1*  --  7.5  --   HGB 14.1   < > 12.4* 13.2  HCT 47.6   < > 37.7* 40.7  MCV 99.2   < > 89.8 90.2  PLT 237   < > 234 249   < > =  values in this interval not displayed.   Cardiac Enzymes: No results for input(s): CKTOTAL, CKMB, CKMBINDEX, TROPONINIHS in the last 72 hours. BNP: No results for input(s): BNP in the last 72 hours. D-Dimer: No results for input(s): DDIMER in the last 72 hours. Hemoglobin A1C: No results for input(s): HGBA1C in the last 72 hours. Fasting Lipid Panel: No results for input(s): CHOL, HDL, LDLCALC, TRIG, CHOLHDL, LDLDIRECT in the last 72 hours. Thyroid Function Tests: No results for input(s): TSH, T4TOTAL, T3FREE, THYROIDAB in the last 72 hours.  Invalid input(s): FREET3 Anemia Panel: No results for input(s): VITAMINB12, FOLATE, FERRITIN, TIBC, IRON, RETICCTPCT in the last 72 hours.  No results found.    ECHO 08/2024: 1. Left ventricular ejection fraction, by estimation, is 60 to 65%. The left ventricle has normal function. The left ventricle has no regional wall motion abnormalities. There is severe concentric left ventricular hypertrophy.  Indeterminate diastolic filling due to E-A fusion. No SAM or LVOT gradient visualized.   2. Right ventricular systolic function is normal. The right ventricular  size is normal.   3. The mitral valve is grossly normal. No evidence of mitral valve  regurgitation. No evidence of mitral stenosis.   4. The aortic valve has an indeterminant number of cusps. Aortic valve  regurgitation is not visualized. No aortic stenosis is present.   5. Aortic dilatation noted. There is mild dilatation of the ascending  aorta, measuring 41 mm.   6. The inferior vena cava is dilated in size with >50% respiratory  variability, suggesting right atrial pressure of 8 mmHg.   TELEMETRY (personally reviewed): sinus rhythm rate 60s PACs  EKG (personally reviewed): Normal sinus rhythm first-degree AV block rate 78 bpm  DATA reviewed by me 09/08/2024: last 24h vitals tele labs imaging I/O, hospitalist progress note  Principal Problem:   Acute on chronic diastolic CHF (congestive heart failure) (HCC) Active Problems:   Essential hypertension   Elevated troponin   Left ankle pain   Syncope    ASSESSMENT AND PLAN: Cody Alexander is a 69 y.o. male with a past medical history of chronic HFpEF, severe LVH, COPD with chronic hypoxic respiratory failure on home O2, hypertension, type 2 diabetes who presented to the ED on 09/01/2024 for chest discomfort, shortness of breath, leg swelling. Cardiology was consulted for further evaluation.   # Acute hypercapnic respiratory failure # Acute on chronic HFpEF # Hypertension Patient presented with worsening cough and shortness of breath, treated for acute heart failure as well as hypercapnic respiratory failure.  Echo this admission with a EF 60 to 65%, no wall motion abnormalities, severe concentric LVH without obvious obstruction. - Further diuresis currently held. - Continue spironolactone  25 mg daily. Will resume losartan  25 mg today. - Continue atorvastatin  80 mg daily,  aspirin  81 mg daily. - Further management of respiratory failure as per primary team.  This patient's case was discussed and created with Dr. Florencio and he is in agreement.  Signed:  Danita Bloch, PA-C  09/08/2024, 9:00 AM [facility]

## 2024-09-09 ENCOUNTER — Other Ambulatory Visit: Payer: Self-pay

## 2024-09-09 DIAGNOSIS — R0602 Shortness of breath: Secondary | ICD-10-CM

## 2024-09-09 DIAGNOSIS — J189 Pneumonia, unspecified organism: Secondary | ICD-10-CM

## 2024-09-09 DIAGNOSIS — M25572 Pain in left ankle and joints of left foot: Secondary | ICD-10-CM | POA: Diagnosis not present

## 2024-09-09 DIAGNOSIS — I5033 Acute on chronic diastolic (congestive) heart failure: Secondary | ICD-10-CM | POA: Diagnosis not present

## 2024-09-09 LAB — GLUCOSE, CAPILLARY
Glucose-Capillary: 120 mg/dL — ABNORMAL HIGH (ref 70–99)
Glucose-Capillary: 200 mg/dL — ABNORMAL HIGH (ref 70–99)
Glucose-Capillary: 258 mg/dL — ABNORMAL HIGH (ref 70–99)
Glucose-Capillary: 259 mg/dL — ABNORMAL HIGH (ref 70–99)
Glucose-Capillary: 279 mg/dL — ABNORMAL HIGH (ref 70–99)

## 2024-09-09 LAB — BASIC METABOLIC PANEL WITH GFR
Anion gap: 11 (ref 5–15)
BUN: 50 mg/dL — ABNORMAL HIGH (ref 8–23)
CO2: 30 mmol/L (ref 22–32)
Calcium: 10.1 mg/dL (ref 8.9–10.3)
Chloride: 93 mmol/L — ABNORMAL LOW (ref 98–111)
Creatinine, Ser: 1.38 mg/dL — ABNORMAL HIGH (ref 0.61–1.24)
GFR, Estimated: 56 mL/min — ABNORMAL LOW
Glucose, Bld: 231 mg/dL — ABNORMAL HIGH (ref 70–99)
Potassium: 3.9 mmol/L (ref 3.5–5.1)
Sodium: 135 mmol/L (ref 135–145)

## 2024-09-09 LAB — CBC
HCT: 47 % (ref 39.0–52.0)
Hemoglobin: 14.9 g/dL (ref 13.0–17.0)
MCH: 29.3 pg (ref 26.0–34.0)
MCHC: 31.7 g/dL (ref 30.0–36.0)
MCV: 92.3 fL (ref 80.0–100.0)
Platelets: 317 K/uL (ref 150–400)
RBC: 5.09 MIL/uL (ref 4.22–5.81)
RDW: 12.9 % (ref 11.5–15.5)
WBC: 13.1 K/uL — ABNORMAL HIGH (ref 4.0–10.5)
nRBC: 0 % (ref 0.0–0.2)

## 2024-09-09 MED ORDER — LOSARTAN POTASSIUM 25 MG PO TABS
25.0000 mg | ORAL_TABLET | Freq: Every day | ORAL | 0 refills | Status: AC
  Filled 2024-09-09: qty 30, 30d supply, fill #0

## 2024-09-09 MED ORDER — SPIRONOLACTONE 25 MG PO TABS
25.0000 mg | ORAL_TABLET | Freq: Every morning | ORAL | 0 refills | Status: AC
  Filled 2024-09-09: qty 30, 30d supply, fill #0

## 2024-09-09 MED ORDER — LOPERAMIDE HCL 2 MG PO CAPS
4.0000 mg | ORAL_CAPSULE | Freq: Once | ORAL | Status: AC
  Administered 2024-09-09: 4 mg via ORAL
  Filled 2024-09-09: qty 2

## 2024-09-09 MED ORDER — LOPERAMIDE HCL 2 MG PO CAPS
2.0000 mg | ORAL_CAPSULE | Freq: Four times a day (QID) | ORAL | Status: DC | PRN
  Administered 2024-09-09: 2 mg via ORAL
  Filled 2024-09-09: qty 1

## 2024-09-09 MED ORDER — ATORVASTATIN CALCIUM 80 MG PO TABS
80.0000 mg | ORAL_TABLET | Freq: Every day | ORAL | 0 refills | Status: AC
  Filled 2024-09-09: qty 30, 30d supply, fill #0

## 2024-09-09 MED ORDER — ASPIRIN 81 MG PO CHEW
81.0000 mg | CHEWABLE_TABLET | Freq: Every day | ORAL | 0 refills | Status: AC
  Filled 2024-09-09: qty 30, 30d supply, fill #0

## 2024-09-09 NOTE — Inpatient Diabetes Management (Signed)
 Inpatient Diabetes Program Recommendations  AACE/ADA: New Consensus Statement on Inpatient Glycemic Control   Target Ranges:  Prepandial:   less than 140 mg/dL      Peak postprandial:   less than 180 mg/dL (1-2 hours)      Critically ill patients:  140 - 180 mg/dL    Latest Reference Range & Units 09/08/24 04:11 09/08/24 08:33 09/08/24 13:02 09/08/24 15:57 09/09/24 00:29 09/09/24 03:23  Glucose-Capillary 70 - 99 mg/dL 792 (H) 827 (H) 694 (H) 307 (H) 200 (H) 259 (H)   Review of Glycemic Control  Diabetes history: DM2 Outpatient Diabetes medications: None Current orders for Inpatient glycemic control: Novolog  3 units TID with meals, Novolog  0-20 units Q4H; Solumedrol 40 mg daily  Inpatient Diabetes Program Recommendations:    Insulin : If steroids are continued as ordered, please consider ordering insulin  glargine 10 units Q24H, increase meal coverage to Novolog  6 units TID with meals, and change CBGs and Novolog  correction to 0-20 units AC&HS.  Thanks, Earnie Gainer, RN, MSN, CDCES Diabetes Coordinator Inpatient Diabetes Program [phone] (Team Pager from 8am to 5pm)

## 2024-09-09 NOTE — Plan of Care (Signed)

## 2024-09-09 NOTE — Progress Notes (Signed)
 [facility] CLINIC CARDIOLOGY PROGRESS NOTE   Patient ID: Cody Alexander MRN: [id number] DOB/AGE: [birth year] 69 y.o.  Admit date: 09/01/2024 Referring Physician Dr. Alexandra Dezii Primary Physician [facility]  Primary Cardiologist [facility]  Reason for Consultation AoCHF, severe LVH  HPI: Cody Alexander is a 69 y.o. male with a past medical history of chronic HFpEF, severe LVH, COPD with chronic hypoxic respiratory failure on home O2, hypertension, type 2 diabetes who presented to the ED on 09/01/2024 for chest discomfort, shortness of breath, leg swelling. Cardiology was consulted for further evaluation.   Interval History:  -Patient seen and examined this morning, resting in bed.  Oxygen is being weaned and he is doing well with this. - Continues to report improvement in SOB. Ready to work with PT more.  - BP has been stable.  Review of systems complete and found to be negative unless listed above   Vitals:   09/09/24 0550 09/09/24 0604 09/09/24 0711 09/09/24 1000  BP:  119/81  101/65  Pulse:  71  76  Resp:  18    Temp:  97.8 F (36.6 C)  (!) 97.5 F (36.4 C)  TempSrc:    Oral  SpO2:  97% 94% 96%  Weight: 129.1 kg     Height:         Intake/Output Summary (Last 24 hours) at 09/09/2024 1049 Last data filed at 09/09/2024 0600 Gross per 24 hour  Intake 1800 ml  Output 1350 ml  Net 450 ml     PHYSICAL EXAM General: Chronically ill-appearing male, well nourished, in no acute distress. HEENT: Normocephalic and atraumatic. Neck: No JVD.  Lungs: Normal respiratory effort on 5L [place]. Clear bilaterally to auscultation. No wheezes, crackles, rhonchi.  Heart: HRRR. Normal S1 and S2 without gallops or murmurs. Radial & DP pulses 2+ bilaterally. Abdomen: Non-distended appearing.  Msk: Normal strength and tone for age. Extremities: No clubbing, cyanosis or edema.   Neuro: Alert and oriented X 3. Psych: Mood appropriate, affect congruent.    LABS: Basic Metabolic  Panel: Recent Labs    09/08/24 0449 09/09/24 0404  NA 133* 135  K 3.6 3.9  CL 91* 93*  CO2 33* 30  GLUCOSE 217* 231*  BUN 56* 50*  CREATININE 1.42* 1.38*  CALCIUM  9.4 10.1   Liver Function Tests: Recent Labs    09/07/24 2334  AST 16  ALT 11  ALKPHOS 57  BILITOT 0.5  PROT 6.7  ALBUMIN 3.9   No results for input(s): LIPASE, AMYLASE in the last 72 hours. CBC: Recent Labs    09/07/24 2334 09/08/24 0449 09/09/24 0404  WBC 9.1 11.0* 13.1*  NEUTROABS 7.5  --   --   HGB 12.4* 13.2 14.9  HCT 37.7* 40.7 47.0  MCV 89.8 90.2 92.3  PLT 234 249 317   Cardiac Enzymes: No results for input(s): CKTOTAL, CKMB, CKMBINDEX, TROPONINIHS in the last 72 hours. BNP: No results for input(s): BNP in the last 72 hours. D-Dimer: No results for input(s): DDIMER in the last 72 hours. Hemoglobin A1C: No results for input(s): HGBA1C in the last 72 hours. Fasting Lipid Panel: No results for input(s): CHOL, HDL, LDLCALC, TRIG, CHOLHDL, LDLDIRECT in the last 72 hours. Thyroid Function Tests: No results for input(s): TSH, T4TOTAL, T3FREE, THYROIDAB in the last 72 hours.  Invalid input(s): FREET3 Anemia Panel: No results for input(s): VITAMINB12, FOLATE, FERRITIN, TIBC, IRON, RETICCTPCT in the last 72 hours.  No results found.    ECHO 08/2024: 1. Left ventricular ejection  fraction, by estimation, is 60 to 65%. The left ventricle has normal function. The left ventricle has no regional wall motion abnormalities. There is severe concentric left ventricular hypertrophy. Indeterminate diastolic filling due to E-A fusion. No SAM or LVOT gradient visualized.   2. Right ventricular systolic function is normal. The right ventricular  size is normal.   3. The mitral valve is grossly normal. No evidence of mitral valve  regurgitation. No evidence of mitral stenosis.   4. The aortic valve has an indeterminant number of cusps. Aortic valve   regurgitation is not visualized. No aortic stenosis is present.   5. Aortic dilatation noted. There is mild dilatation of the ascending  aorta, measuring 41 mm.   6. The inferior vena cava is dilated in size with >50% respiratory  variability, suggesting right atrial pressure of 8 mmHg.   TELEMETRY (personally reviewed): sinus rhythm rate 60s   EKG (personally reviewed): Normal sinus rhythm first-degree AV block rate 78 bpm  DATA reviewed by me 09/09/24: last 24h vitals tele labs imaging I/O, hospitalist progress note  Principal Problem:   Acute on chronic diastolic CHF (congestive heart failure) (HCC) Active Problems:   Essential hypertension   Elevated troponin   Left ankle pain   Syncope    ASSESSMENT AND PLAN: Cody Alexander is a 69 y.o. male with a past medical history of chronic HFpEF, severe LVH, COPD with chronic hypoxic respiratory failure on home O2, hypertension, type 2 diabetes who presented to the ED on 09/01/2024 for chest discomfort, shortness of breath, leg swelling. Cardiology was consulted for further evaluation.   # Acute hypercapnic respiratory failure # Acute on chronic HFpEF # Hypertension Patient presented with worsening cough and shortness of breath, treated for acute heart failure as well as hypercapnic respiratory failure.  Echo this admission with a EF 60 to 65%, no wall motion abnormalities, severe concentric LVH without obvious obstruction. - Continue spironolactone  25 mg daily.  Continue losartan  25 mg today.  Can consider resuming home Bumex  tomorrow pending renal function. - Continue atorvastatin  80 mg daily, aspirin  81 mg daily. - Further management of respiratory failure as per primary team.  This patient's case was discussed and created with Dr. Florencio and he is in agreement.  Signed:  Danita Bloch, PA-C  09/09/2024, 10:49 AM [facility]

## 2024-09-09 NOTE — Final Progress Note (Signed)
 Patient is medically stable for discharge.  I have clearly instructed to make sure he has his oxygen tank considering he uses 3 L oxygen at baseline.  Patient states he goes without oxygen all the time and I have explained to him all the risk of going without oxygen.  He understands it but still wanting to leave anyway.

## 2024-09-09 NOTE — Progress Notes (Signed)
 Physical Therapy Treatment Patient Details Name: Cody Alexander MRN: [id number] DOB: 12-03-1955 Today's Date: 09/09/2024   History of Present Illness 69 year old male with diastolic CHF, COPD, chronic hypoxic respiratory failure on 3 L at baseline, type 2 diabetes, essential hypertension, peripheral neuropathy, who presents to the ED with a constellation of symptoms including midsternal chest pain, orthopnea, lower extremity edema, and ankle pain. MRI reveals partial tear of prior L achilles tendon injury. Per podiatry, to wear CAM boot with mobility.    PT Comments  Patient seen for PT session focused on functional household mobility. Patient required CGA/minA for 25' ambulation distance and ~1 min standing marches at bedside with RW . Tolerated session well with no  signs of exertion. Vitals remained stable during activity.. Interventions aimed at improving OOB mobility . Patient shows good potential to make progress with continued acute level rehab. Patient continues to demonstrate mild activity restrictions and poor tolerance for progressive mobility. Continued skilled PT recommended to progress toward functional goals and support discharge readiness. Pt making good progress toward goals, will continue to follow POC. Discharge recommendation remains appropriate     If plan is discharge home, recommend the following: A lot of help with walking and/or transfers;A little help with bathing/dressing/bathroom;Assistance with cooking/housework;Assist for transportation;Help with stairs or ramp for entrance   Can travel by private vehicle     Yes  Equipment Recommendations  Rolling walker (2 wheels);BSC/3in1    Recommendations for Other Services       Precautions / Restrictions Precautions Precautions: Fall Recall of Precautions/Restrictions: Impaired Restrictions Weight Bearing Restrictions Per Provider Order: Yes LLE Weight Bearing Per Provider Order: Weight bearing as tolerated Other  Position/Activity Restrictions: with CAM boot     Mobility  Bed Mobility Overal bed mobility: Needs Assistance Bed Mobility: Supine to Sit     Supine to sit: Supervision Sit to supine: Modified independent (Device/Increase time)        Transfers Overall transfer level: Needs assistance Equipment used: Rolling walker (2 wheels) Transfers: Sit to/from Stand Sit to Stand: Contact guard assist                Ambulation/Gait Ambulation/Gait assistance: Min assist, Contact guard assist Gait Distance (Feet): 25 Feet Assistive device: Rolling walker (2 wheels) Gait Pattern/deviations: Step-to pattern, Decreased stride length, Decreased stance time - left, Knees buckling     Pre-gait activities: marching 1 min. at bedsided moving forward and backwards General Gait Details: mildly unstable; poor use of RW and impaired safety awareness   Stairs             Wheelchair Mobility     Tilt Bed    Modified Rankin (Stroke Patients Only)       Balance Overall balance assessment: Needs assistance Sitting-balance support: No upper extremity supported, Feet supported Sitting balance-Leahy Scale: Good     Standing balance support: Bilateral upper extremity supported, During functional activity, Reliant on assistive device for balance Standing balance-Leahy Scale: Fair                              Hotel Manager: No apparent difficulties Factors Affecting Communication: Hearing impaired  Cognition Arousal: Alert Behavior During Therapy: WFL for tasks assessed/performed   PT - Cognitive impairments: No apparent impairments                         Following commands: Impaired Following commands impaired: Follows one  step commands inconsistently    Cueing Cueing Techniques: Verbal cues, Gestural cues, Tactile cues, Visual cues  Exercises      General Comments        Pertinent Vitals/Pain Pain  Assessment Pain Assessment: No/denies pain    Home Living                          Prior Function            PT Goals (current goals can now be found in the care plan section) Acute Rehab PT Goals Patient Stated Goal: to go home PT Goal Formulation: With patient Time For Goal Achievement: 09/21/24 Potential to Achieve Goals: Fair Progress towards PT goals: Progressing toward goals    Frequency    Min 2X/week      PT Plan      Co-evaluation              AM-PAC PT 6 Clicks Mobility   Outcome Measure  Help needed turning from your back to your side while in a flat bed without using bedrails?: None Help needed moving from lying on your back to sitting on the side of a flat bed without using bedrails?: None Help needed moving to and from a bed to a chair (including a wheelchair)?: A Little Help needed standing up from a chair using your arms (e.g., wheelchair or bedside chair)?: A Little Help needed to walk in hospital room?: A Little Help needed climbing 3-5 steps with a railing? : A Little 6 Click Score: 20    End of Session Equipment Utilized During Treatment: Oxygen Activity Tolerance: Patient tolerated treatment well Patient left: with call bell/phone within reach;in chair;with chair alarm set Nurse Communication: Mobility status PT Visit Diagnosis: Unsteadiness on feet (R26.81);Muscle weakness (generalized) (M62.81);Other abnormalities of gait and mobility (R26.89);History of falling (Z91.81);Difficulty in walking, not elsewhere classified (R26.2)     Time: 8881-8865 PT Time Calculation (min) (ACUTE ONLY): 16 min  Charges:    $Therapeutic Activity: 8-22 mins PT General Charges $$ ACUTE PT VISIT: 1 Visit                     Sherlean Lesches DPT, PT     Sherlean A Aryona Sill 09/09/2024, 11:39 AM

## 2024-09-09 NOTE — TOC Transition Note (Signed)
 Transition of Care [facility]) - Discharge Note   Patient Details  Name: Cody Alexander MRN: [id number] Date of Birth: 05-07-1956  Transition of Care [facility] - Closed) CM/SW Contact:  Shasta DELENA Daring, RN Phone Number: 09/09/2024, 4:46 PM   Clinical Narrative:     Patient will discharge home. Taxi voucher provided. Patient uses [facility] advante for O2 and they are unable to provide a transport O2 tank. Patient is aware and MD is aware.  [facility] notified of discharge.    No additional TOC needs  RNCM signing off    Barriers to Discharge: Barriers Resolved   Patient Goals and CMS Choice            Discharge Placement                       Discharge Plan and Services Additional resources added to the After Visit Summary for       Post Acute Care Choice: Resumption of Svcs/PTA Provider                    HH Arranged: RN, PT, OT, Social Work [facility] Agency: [facility] Date [facility] Agency Contacted: 09/09/24 Time HH Agency Contacted: [id number] Representative spoke with at [facility] Agency: cheryl  Social Drivers of Health (SDOH) Interventions SDOH Screenings   Food Insecurity: No Food Insecurity (09/03/2024)  Housing: Low Risk (09/03/2024)  Transportation Needs: No Transportation Needs (09/03/2024)  Utilities: Not At Risk (09/03/2024)  Alcohol Screen: Low Risk (07/23/2023)  Financial Resource Strain: Low Risk (07/23/2023)  Physical Activity: Inactive (09/11/2023)   Received from [facility]  Social Connections: Unknown (09/03/2024)  Stress: Stress Concern Present (09/11/2023)   Received from [facility]  Tobacco Use: Medium Risk (09/01/2024)  Health Literacy: Medium Risk (09/11/2023)   Received from [facility]     Readmission Risk Interventions    09/07/2024    1:28 PM 09/29/2023    1:04 PM 07/23/2023    1:58 PM  Readmission Risk Prevention Plan  Transportation Screening Complete Complete Complete  PCP or Specialist Appt within 3-5 Days Complete Complete Complete   HRI or Home Care Consult Complete Complete Complete  Social Work Consult for Recovery Care Planning/Counseling  Complete Complete  Palliative Care Screening Not Applicable Not Applicable Not Applicable  Medication Review Oceanographer) Complete Complete Complete

## 2024-09-10 NOTE — Discharge Summary (Signed)
 " Physician Discharge Summary   Patient: Cody Alexander MRN: [id number] DOB: 1955-10-02  Admit date:     09/01/2024  Discharge date: 09/09/2024  Discharge Physician: Cresencio Fairly   PCP: [facility]   Recommendations at discharge:    F/up with outpt providers as requested  Discharge Diagnoses: Principal Problem:   Acute on chronic diastolic CHF (congestive heart failure) (HCC) Active Problems:   Left ankle pain   Syncope   Elevated troponin   Essential hypertension   Pneumonia due to infectious organism   Shortness of breath  Hospital Course: Assessment and Plan:  69 year old male with diastolic CHF, COPD, chronic hypoxic respiratory failure on 3 L at baseline, type 2 diabetes, essential hypertension, peripheral neuropathy, who presents to the ED with a constellation of symptoms including midsternal chest pain, orthopnea, lower extremity edema, and ankle pain. In the ED proBNP 96, high-sensitivity troponin 66 with repeat 74.  Labs and vitals otherwise within normal limits. Chest CTA negative for PE, mild bibasilar atelectasis with mild patchy ground glass opacities in the lung bases bilaterally favoring edema. He was started on IV diuresis and admitted. he was doing better and was pending SNF placement. Rapid response on 1/11 for severe hypercapnia.  Patient was transferred to the ICU on BiPAP. Patient has improved significantly on BiPAP, tolerating heated high flow and weaned off to 3 liters [place] which is his baseline O2   1/14 -weaning efforts to get him off of heated high flow, will start weaning off his narcotics   Acute on Chronic hypercapnic respiratory failure - required BiPAP -> heated high flow-> weaned off to 3 liters [place] - improved with steroids, nebulizer and Diuretics   Acute on chronic diastolic CHF - Diuresed aggressively and is euvolemic now  - Echocardiogram: known severe left ventricular hypertrophy, no regional wall motion abnormalities, indeterminate  diastolic filling. - Followed by [facility] cardiology while here.  Patient follows with [facility] cardiology outpatient though it has been sometime since he has seen them - Previously was referred to heart failure team but has had difficulty making appointments   Acute urinary retention - Foley catheter placed while in ICU.  Foley catheter removed on 1/13 and has been voiding without difficulty   Elevated troponin - due to demand ischemia secondary to acute CHF.  Had no further chest pain - Low threshold to repeat troponin or EKG if chest pain recurs  - Chest pain is reproducible upon palpation - Echo without regional wall motion abnormality.     CAD with prior PCI - Last LHC in 2019   Syncope - could be due to hypoxia, no recurrence while here - Orthostatic vitals: Negative - PT, OT recommended SNF but he refused   Left ankle pain Prior left Achilles tendon injury - Podiatry seen.  No bony abnormality on plain film. Outpt f/up with podiatry - MRI showing partial tear - Plan for conservative management for now.  Podiatry has ordered equalizer walking boot. - Patient is not a surgical candidate - PT/OT recommending SNF.  TOC consulted for these arrangements.  PT/OT recommends SNF but patient refuses   Hypertension - Continue home meds   COPD Chronic hypoxic respiratory failure - Home O2 3 L.  Severe COPD   Obstructive sleep apnea - Continue with CPAP at night - Suspect patient may also have some degree of OHS with severity of hypercapnia   Type 2 diabetes - A1c 7.6%   Rash - Has been seen outpatient by dermatology.  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 Obesity Class II - Outpatient follow up for lifestyle  modification and risk factor management   Generalized deconditioning - Deconditioning beyond his baseline secondary to new heel pain.  PT/OT recommending SNF, but is still refusing   Patient is medically stable for discharge.  I have clearly instructed to make sure he has his oxygen tank considering he uses 3 L oxygen at baseline.  Patient states he goes without oxygen all the time and I have explained to him all the risk of going without oxygen.  He understands it but still wanting to leave anyway.        Consultants: Cardio, PCCM  Disposition: Home health Diet recommendation:  Carb modified diet DISCHARGE MEDICATION: Allergies as of 09/09/2024       Reactions   Erythromycin Anaphylaxis, Swelling   Had eye swelling with erythromycin during a time when he had perf ear drum. Tolerated azithromycin .        Medication List     STOP taking these medications    aspirin  EC 81 MG EC tablet Generic drug: aspirin  EC Replaced by: Aspirin  Low Dose 81 MG chewable tablet   cephALEXin  500 MG capsule Commonly known as: KEFLEX    clotrimazole 1 % cream Commonly known as: LOTRIMIN   fluconazole  200 MG tablet Commonly known as: DIFLUCAN    hydrocortisone  cream 1 %   hydrOXYzine  25 MG tablet Commonly known as: ATARAX    ipratropium-albuterol  0.5-2.5 (3) MG/3ML Soln Commonly known as: DUONEB   lisinopril  5 MG tablet Commonly known as: ZESTRIL    Omeprazole 20 MG Tbec   Symbicort  160-4.5 MCG/ACT inhaler Generic drug: budesonide -formoterol        TAKE these medications    acetaminophen  500 MG tablet Commonly known as: TYLENOL  Take 500 mg by mouth every 8 (eight) hours as needed for mild pain (pain score 1-3) or moderate pain (pain score 4-6).   albuterol  108 (90 Base) MCG/ACT inhaler Commonly known as: Proventil  HFA Inhale 2 puffs into the lungs every 4 (four) hours as needed.   Aspirin  Low Dose 81 MG chewable tablet Generic drug: aspirin  Chew 1 tablet (81 mg total)  by mouth daily. Replaces: aspirin  EC 81 MG EC tablet   atorvastatin  80 MG tablet Commonly known as: LIPITOR  Take 1 tablet (80 mg total) by mouth daily.   bumetanide  1 MG tablet Commonly known as: BUMEX  Take 3 mg by mouth daily.   losartan  25 MG tablet Commonly known as: COZAAR  Take 1 tablet (25 mg total) by mouth daily.   spironolactone  25 MG tablet Commonly known as: ALDACTONE  Take 1 tablet (25 mg total) by mouth every morning.        Follow-up Information     Lennie Barter, DPM. Schedule an appointment as soon as possible for a visit in 2 week(s).   Specialty: Podiatry Contact information: [address] [place] [address] [phone]         Launie Maiden, Ronal Maxwell, NP. Go in 1 week(s).   Specialty: Nurse Practitioner Contact information: [address] [place] [address] [phone]         [facility] Schedule an appointment as soon as possible for a visit in  1 week(s).   Why: [facility] Discharge F/UP Contact information: [address] [place] [address] [phone]         [place], Caralyn, PA-C. Schedule an appointment as soon as possible for a visit in 2 week(s).   Specialty: Cardiology Why: [facility] Discharge F/UP Contact information: [address] [place] [address] [phone]                Discharge Exam: Fredricka Weights   09/01/24 1923 09/05/24 0955 09/09/24 0550  Weight: 136.1 kg 130.4 kg 129.1 kg   General exam: Appears calm and comfortable, NAD  Respiratory system: Heated high flow, better aeration bilaterally.  Lung exam somewhat limited by body habitus Cardiovascular system: S1 & S2 heard, RRR.  Neuro: Alert, oriented x3 Extremities: Bilateral feet and lower legs demonstrates skin thickening, flaking, debris  Condition at discharge: fair  The results of significant diagnostics from this hospitalization (including imaging, microbiology,  ancillary and laboratory) are listed below for reference.   Imaging Studies: DG Chest Port 1 View Result Date: 09/05/2024 EXAM: 1 VIEW XRAY OF THE CHEST 09/05/2024 09:17:00 AM COMPARISON: 09/01/2024 CLINICAL HISTORY: 69 year old male with hypercapnic respiratory failure (hepatocellular carcinoma). FINDINGS: LUNGS AND PLEURA: Low lung volumes. Linear opacity in left mid lung, and patchy left greater than right lung bases, stable compared to prior exam. No pleural effusion. No pneumothorax. HEART AND MEDIASTINUM: Cardiomegaly, stable. Aortic atherosclerosis. BONES AND SOFT TISSUES: No acute osseous abnormality. IMPRESSION: 1. Ongoing low lung volumes, atelectasis. Electronically signed by: Helayne Hurst MD MD 09/05/2024 09:28 AM EST RP Workstation: [id number]   ECHOCARDIOGRAM COMPLETE Result Date: 09/03/2024    ECHOCARDIOGRAM REPORT   Patient Name:   Cody Alexander Date of Exam: 09/03/2024 Medical Rec #:  [id number]         Height:       73.0 in Accession #:    [id number]        Weight:       300.0 lb Date of Birth:  1955-12-17         BSA:          2.556 m Patient Age:    68 years          BP:           133/36 mmHg Patient Gender: M                 HR:           81 bpm. Exam Location:  [facility] Procedure: 2D Echo, Cardiac Doppler, Color Doppler and Intracardiac            Opacification Agent (Both Spectral and Color Flow Doppler were            utilized during procedure). Indications:     Syncope  History:         Patient has prior history of Echocardiogram examinations, most                  recent 01/07/2023. CHF, CAD, COPD; Risk Factors:Hypertension,                  Diabetes, Sleep Apnea and Dyslipidemia.  Sonographer:     Philomena Daring Referring Phys:  [id number] GJW A MANSY Diagnosing Phys: Caron Poser IMPRESSIONS  1. Left ventricular ejection fraction, by estimation, is 60 to 65%. The left ventricle has normal function. The left ventricle has no regional wall motion abnormalities. There is severe concentric left  ventricular  hypertrophy. Indeterminate diastolic filling due to E-A fusion. No SAM or LVOT gradient visualized.  2. Right ventricular systolic function is normal. The right ventricular size is normal.  3. The mitral valve is grossly normal. No evidence of mitral valve regurgitation. No evidence of mitral stenosis.  4. The aortic valve has an indeterminant number of cusps. Aortic valve regurgitation is not visualized. No aortic stenosis is present.  5. Aortic dilatation noted. There is mild dilatation of the ascending aorta, measuring 41 mm.  6. The inferior vena cava is dilated in size with >50% respiratory variability, suggesting right atrial pressure of 8 mmHg. Comparison(s): A prior study was performed on 01/07/2023. No significant change from prior study. FINDINGS  Left Ventricle: Left ventricular ejection fraction, by estimation, is 60 to 65%. The left ventricle has normal function. The left ventricle has no regional wall motion abnormalities. Definity  contrast agent was given IV to delineate the left ventricular  endocardial borders. The left ventricular internal cavity size was normal in size. There is severe concentric left ventricular hypertrophy. Indeterminate diastolic filling due to E-A fusion. Right Ventricle: The right ventricular size is normal. Right vetricular wall thickness was not well visualized. Right ventricular systolic function is normal. Left Atrium: Left atrial size was normal in size. Right Atrium: Right atrial size was normal in size. Pericardium: Trivial pericardial effusion is present. Mitral Valve: The mitral valve is grossly normal. No evidence of mitral valve regurgitation. No evidence of mitral valve stenosis. Tricuspid Valve: The tricuspid valve is not well visualized. Tricuspid valve regurgitation is not demonstrated. No evidence of tricuspid stenosis. Aortic Valve: The aortic valve has an indeterminant number of cusps. Aortic valve regurgitation is not visualized. No aortic  stenosis is present. Pulmonic Valve: The pulmonic valve was not well visualized. Pulmonic valve regurgitation is not visualized. No evidence of pulmonic stenosis. Aorta: The aortic root is normal in size and structure and aortic dilatation noted. There is mild dilatation of the ascending aorta, measuring 41 mm. Venous: The inferior vena cava is dilated in size with greater than 50% respiratory variability, suggesting right atrial pressure of 8 mmHg. IAS/Shunts: The interatrial septum was not well visualized.  LEFT VENTRICLE PLAX 2D LVIDd:         4.50 cm   Diastology LVIDs:         2.80 cm   LV e' medial:    6.20 cm/s LV PW:         1.50 cm   LV E/e' medial:  12.9 LV IVS:        1.80 cm   LV e' lateral:   5.00 cm/s LVOT diam:     2.10 cm   LV E/e' lateral: 16.0 LV SV:         76 LV SV Index:   30 LVOT Area:     3.46 cm  RIGHT VENTRICLE             IVC RV Basal diam:  3.80 cm     IVC diam: 2.40 cm RV Mid diam:    2.50 cm RV S prime:     18.20 cm/s TAPSE (M-mode): 2.6 cm LEFT ATRIUM             Index        RIGHT ATRIUM           Index LA diam:        3.40 cm 1.33 cm/m   RA Area:     19.70 cm LA Vol (  A2C):   86.8 ml 33.96 ml/m  RA Volume:   51.80 ml  20.26 ml/m LA Vol (A4C):   49.7 ml 19.44 ml/m LA Biplane Vol: 66.1 ml 25.86 ml/m  AORTIC VALVE LVOT Vmax:   134.00 cm/s LVOT Vmean:  83.600 cm/s LVOT VTI:    0.218 m  AORTA Ao Root diam: 3.40 cm MITRAL VALVE               TRICUSPID VALVE MV Area (PHT): 3.89 cm    TR Peak grad:   8.5 mmHg MV Decel Time: 195 msec    TR Vmax:        146.00 cm/s MV E velocity: 79.90 cm/s MV A velocity: 85.90 cm/s  SHUNTS MV E/A ratio:  0.93        Systemic VTI:  0.22 m                            Systemic Diam: 2.10 cm Caron Poser Electronically signed by Caron Poser Signature Date/Time: 09/03/2024/9:04:22 AM    Final    MR ANKLE LEFT WO CONTRAST Result Date: 09/03/2024 CLINICAL DATA:  Achilles tendon pain. History of prior Achilles repair over 30 years ago. EXAM: MRI OF THE LEFT  ANKLE WITHOUT CONTRAST TECHNIQUE: Multiplanar, multisequence MR imaging of the ankle was performed. No intravenous contrast was administered. COMPARISON:  Left foot radiographs dated 09/02/2024. FINDINGS: Peroneal: Peroneal longus tendon intact. Peroneal brevis intact. Posteromedial: Posterior tibial tendon intact. Flexor hallucis longus tendon intact. Flexor digitorum longus tendon intact. Anterior: Tibialis anterior tendon intact. Extensor hallucis longus tendon intact Extensor digitorum longus tendon intact. Achilles: Fusiform thickening of the distal Achilles tendon with irregular curvilinear intrasubstance T2 hyperintense signal extending for a length of approximately 4.7 cm through the center of the thickened distal Achilles tendon with possible extension through the posterior superficial fibers, concerning for partial-thickness tear with tendinosis. The distal margin of the partial-thickness tear is 4.5 cm proximal to the calcaneal insertion of the Achilles tendon. The calcaneal insertion of the Achilles tendon is intact without discrete tear. No evidence of tendon retraction. Plantar Fascia: Intact. LIGAMENTS Lateral: Anterior talofibular ligament intact. Calcaneofibular ligament intact. Posterior talofibular ligament intact. Anterior and posterior tibiofibular ligaments intact. Medial: Deltoid ligament intact. Spring ligament intact. CARTILAGE Ankle Joint: No joint effusion. Normal ankle mortise. No chondral defect. Subtalar Joints/Sinus Tarsi: Normal subtalar joints. No subtalar joint effusion. Normal sinus tarsi. Bones: No aggressive osseous lesion. No fracture or dislocation. Mild joint space narrowing of the talonavicular, naviculocuneiform, and midfoot articulations. Soft Tissue: Diffuse subcutaneous edema of the ankle. No loculated fluid collection. Generalized atrophy of the musculature. IMPRESSION: 1. Findings most compatible with partial-thickness tear of the distal Achilles tendon extending for a  length of approximately 4.7 cm with the distal margin of the partial-thickness tear 4.5 cm proximal to the calcaneal insertion. The calcaneal insertion of the Achilles tendon is intact without discrete tear. Associated distal Achilles tendinosis. 2. Diffuse subcutaneous edema of the ankle. Electronically Signed   By: Harrietta Sherry M.D.   On: 09/03/2024 08:24   DG Foot 2 Views Left Result Date: 09/02/2024 EXAM: 2 VIEW(S) XRAY OF THE LEFT FOOT 09/02/2024 06:59:00 AM COMPARISON: None available. CLINICAL HISTORY: 69 year old male. Left ankle pain. FINDINGS: BONES AND JOINTS: Appearance of healed chronic fractures 2nd through 4th proximal phalanges. No acute fracture. No malalignment. Mild osteoarthritis of the first metatarsophalangeal joint. SOFT TISSUES: Diffuse soft tissue edema. No soft tissue gas. IMPRESSION: 1.  No acute osseous abnormality identified about the left foot. Diffuse left foot soft tissue swelling. Electronically signed by: Helayne Hurst MD MD 09/02/2024 07:06 AM EST RP Workstation: [id number]   CT Angio Chest PE W and/or Wo Contrast Result Date: 09/01/2024 EXAM: CTA of the Chest with contrast for PE 09/01/2024 05:13:29 PM TECHNIQUE: CTA of the chest was performed without and with the administration of 75 mL of iohexol  (OMNIPAQUE ) 350 MG/ML injection. Multiplanar reformatted images are provided for review. MIP images are provided for review. Automated exposure control, iterative reconstruction, and/or weight based adjustment of the mA/kV was utilized to reduce the radiation dose to as low as reasonably achievable. COMPARISON: CT angiogram chest for 01/18/2024 and CT abdomen and pelvis 01/05/2023. CLINICAL HISTORY: left sided chest pain, hypoxia FINDINGS: PULMONARY ARTERIES: Pulmonary arteries are adequately opacified for evaluation. No pulmonary embolism. Main pulmonary artery is normal in caliber. MEDIASTINUM: The heart is mildly enlarged. Coronary atherosclerotic calcifications are noted. Aortic  atherosclerotic calcifications are noted. There is no acute abnormality of the thoracic aorta. The pericardium demonstrates no acute abnormality. LYMPH NODES: No mediastinal, hilar or axillary lymphadenopathy. LUNGS AND PLEURA: Mild atelectatic changes in both lower lobes. There are some mild patchy ground glass opacities in the lung bases bilaterally. No pleural effusion or pneumothorax. UPPER ABDOMEN: No significant interval change in peripheral mildly hyperdense area in the right lobe of the liver measuring 2.8 x 1.1 cm when compared to 01/05/2023. SOFT TISSUES AND BONES: No acute bone or soft tissue abnormality. IMPRESSION: 1. No pulmonary embolism. 2. Mild bibasilar atelectasis with mild patchy ground-glass opacities in the lung bases bilaterally. Findings may be related to mild edema or infectious/inflammatory process. 3. Mild cardiomegaly with coronary and aortic atherosclerotic calcifications. 4. No significant interval change in a peripheral mildly hyperdense right hepatic lesion measuring 2.8 x 1.1 cm compared to 01/05/2023, favored as benign. Electronically signed by: Greig Pique MD MD 09/01/2024 05:29 PM EST RP Workstation: [id number]   DG Chest 2 View Result Date: 09/01/2024 CLINICAL DATA:  Chest pain. EXAM: CHEST - 2 VIEW COMPARISON:  12/18/2023 FINDINGS: Cardiomegaly. Slightly increased densities in left mid lung could represent atelectasis with underlying scarring in this area. No overt pulmonary edema. No focal airspace disease. Trachea is midline. No large pleural effusions. No acute bone abnormality. IMPRESSION: 1. Slightly increased densities in the left mid lung could represent atelectasis with underlying scarring. 2. Cardiomegaly.  No pulmonary edema. Electronically Signed   By: Juliene Balder M.D.   On: 09/01/2024 14:41    Microbiology: Results for orders placed or performed during the hospital encounter of 09/01/24  Resp panel by RT-PCR (RSV, Flu A&B, Covid) Nasal Mucosa     Status: None    Collection Time: 09/05/24 10:00 AM   Specimen: Nasal Mucosa; Nasal Swab  Result Value Ref Range Status   SARS Coronavirus 2 by RT PCR NEGATIVE NEGATIVE Final    Comment: (NOTE) SARS-CoV-2 target nucleic acids are NOT DETECTED.  The SARS-CoV-2 RNA is generally detectable in upper respiratory specimens during the acute phase of infection. The lowest concentration of SARS-CoV-2 viral copies this assay can detect is 138 copies/mL. A negative result does not preclude SARS-Cov-2 infection and should not be used as the sole basis for treatment or other patient management decisions. A negative result may occur with  improper specimen collection/handling, submission of specimen other than nasopharyngeal swab, presence of viral mutation(s) within the areas targeted by this assay, and inadequate number of viral copies(<138 copies/mL). A negative result must  be combined with clinical observations, patient history, and epidemiological information. The expected result is Negative.  Fact Sheet for Patients:  bloggercourse.com  Fact Sheet for Healthcare Providers:  seriousbroker.it  This test is no t yet approved or cleared by the [place]  FDA and  has been authorized for detection and/or diagnosis of SARS-CoV-2 by FDA under an Emergency Use Authorization (EUA). This EUA will remain  in effect (meaning this test can be used) for the duration of the COVID-19 declaration under Section 564(b)(1) of the Act, 21 U.S.C.section 360bbb-3(b)(1), unless the authorization is terminated  or revoked sooner.       Influenza A by PCR NEGATIVE NEGATIVE Final   Influenza B by PCR NEGATIVE NEGATIVE Final    Comment: (NOTE) The Xpert Xpress SARS-CoV-2/FLU/RSV plus assay is intended as an aid in the diagnosis of influenza from Nasopharyngeal swab specimens and should not be used as a sole basis for treatment. Nasal washings and aspirates are unacceptable  for Xpert Xpress SARS-CoV-2/FLU/RSV testing.  Fact Sheet for Patients: bloggercourse.com  Fact Sheet for Healthcare Providers: seriousbroker.it  This test is not yet approved or cleared by the [place]  FDA and has been authorized for detection and/or diagnosis of SARS-CoV-2 by FDA under an Emergency Use Authorization (EUA). This EUA will remain in effect (meaning this test can be used) for the duration of the COVID-19 declaration under Section 564(b)(1) of the Act, 21 U.S.C. section 360bbb-3(b)(1), unless the authorization is terminated or revoked.     Resp Syncytial Virus by PCR NEGATIVE NEGATIVE Final    Comment: (NOTE) Fact Sheet for Patients: bloggercourse.com  Fact Sheet for Healthcare Providers: seriousbroker.it  This test is not yet approved or cleared by the [place]  FDA and has been authorized for detection and/or diagnosis of SARS-CoV-2 by FDA under an Emergency Use Authorization (EUA). This EUA will remain in effect (meaning this test can be used) for the duration of the COVID-19 declaration under Section 564(b)(1) of the Act, 21 U.S.C. section 360bbb-3(b)(1), unless the authorization is terminated or revoked.  Performed at [facility], [address] [place], [place] [address]   MRSA Next Gen by PCR, Nasal     Status: None   Collection Time: 09/05/24 10:00 AM   Specimen: Nasal Mucosa; Nasal Swab  Result Value Ref Range Status   MRSA by PCR Next Gen NOT DETECTED NOT DETECTED Final    Comment: (NOTE) The GeneXpert MRSA Assay (FDA approved for NASAL specimens only), is one component of a comprehensive MRSA colonization surveillance program. It is not intended to diagnose MRSA infection nor to guide or monitor treatment for MRSA infections. Test performance is not FDA approved in patients less than 65 years old. Performed at [facility], [address] [place], [place] [address]     Labs: CBC: Recent Labs  Lab 09/05/24 [phone] 09/05/24 2028 09/06/24 0348 09/07/24 0413 09/07/24 2334 09/08/24 0449 09/09/24 0404  WBC 10.5   < > 11.5* 9.7 9.1 11.0* 13.1*  NEUTROABS 9.1*  --   --   --  7.5  --   --   HGB 14.1   < > 12.9* 12.4* 12.4* 13.2 14.9  HCT 47.6   < > 41.4 38.3* 37.7* 40.7 47.0  MCV 99.2   < > 95.8 91.8 89.8 90.2 92.3  PLT 237   < > 251 236 234 249 317   < > = values in this interval not displayed.   Basic Metabolic Panel: Recent Labs  Lab  09/05/24 2028 09/06/24 0348 09/07/24 0413 09/07/24 2334 09/08/24 0449 09/09/24 0404  NA 142 141 134* 134* 133* 135  K 4.3 4.1 3.9 3.7 3.6 3.9  CL 89* 90* 87* 90* 91* 93*  CO2 44* 41* 37* 35* 33* 30  GLUCOSE 180* 192* 241* 207* 217* 231*  BUN 36* 44* 64* 56* 56* 50*  CREATININE 1.54* 1.83* 1.87* 1.47* 1.42* 1.38*  CALCIUM  9.4 9.3 9.1 9.5 9.4 10.1  MG 2.1 2.0  --   --   --   --   PHOS 3.4  --   --   --   --   --    Liver Function Tests: Recent Labs  Lab 09/05/24 0928 09/05/24 2028 09/07/24 2334  AST 22 20 16   ALT 21 19 11   ALKPHOS 88 72 57  BILITOT 0.7 0.6 0.5  PROT 8.2* 7.1 6.7  ALBUMIN 4.6 4.2 3.9   CBG: Recent Labs  Lab 09/09/24 0029 09/09/24 0323 09/09/24 0901 09/09/24 1316 09/09/24 1642  GLUCAP 200* 259* 120* 258* 279*    Discharge time spent: greater than 30 minutes.  Signed: Cresencio Fairly, MD [facility] 09/10/2024 "

## 2024-09-11 LAB — NOROVIRUS GROUP 1 & 2 BY PCR, STOOL
Norovirus 1 by PCR: NEGATIVE
Norovirus 2  by PCR: NEGATIVE

## 2024-09-21 ENCOUNTER — Other Ambulatory Visit: Payer: Self-pay

## 2024-09-21 ENCOUNTER — Inpatient Hospital Stay: Admission: EM | Admit: 2024-09-21 | Discharge: 2024-09-26 | DRG: 193 | Disposition: A

## 2024-09-21 ENCOUNTER — Emergency Department

## 2024-09-21 DIAGNOSIS — Z7982 Long term (current) use of aspirin: Secondary | ICD-10-CM

## 2024-09-21 DIAGNOSIS — J1008 Influenza due to other identified influenza virus with other specified pneumonia: Principal | ICD-10-CM | POA: Diagnosis present

## 2024-09-21 DIAGNOSIS — Z8249 Family history of ischemic heart disease and other diseases of the circulatory system: Secondary | ICD-10-CM

## 2024-09-21 DIAGNOSIS — E662 Morbid (severe) obesity with alveolar hypoventilation: Secondary | ICD-10-CM | POA: Diagnosis present

## 2024-09-21 DIAGNOSIS — Z794 Long term (current) use of insulin: Secondary | ICD-10-CM

## 2024-09-21 DIAGNOSIS — I2510 Atherosclerotic heart disease of native coronary artery without angina pectoris: Secondary | ICD-10-CM | POA: Diagnosis present

## 2024-09-21 DIAGNOSIS — J9622 Acute and chronic respiratory failure with hypercapnia: Secondary | ICD-10-CM | POA: Diagnosis present

## 2024-09-21 DIAGNOSIS — J188 Other pneumonia, unspecified organism: Secondary | ICD-10-CM

## 2024-09-21 DIAGNOSIS — J9621 Acute and chronic respiratory failure with hypoxia: Secondary | ICD-10-CM | POA: Diagnosis present

## 2024-09-21 DIAGNOSIS — Z1152 Encounter for screening for COVID-19: Secondary | ICD-10-CM

## 2024-09-21 DIAGNOSIS — J440 Chronic obstructive pulmonary disease with acute lower respiratory infection: Secondary | ICD-10-CM | POA: Diagnosis present

## 2024-09-21 DIAGNOSIS — I110 Hypertensive heart disease with heart failure: Secondary | ICD-10-CM | POA: Diagnosis present

## 2024-09-21 DIAGNOSIS — J441 Chronic obstructive pulmonary disease with (acute) exacerbation: Principal | ICD-10-CM | POA: Diagnosis present

## 2024-09-21 DIAGNOSIS — E1165 Type 2 diabetes mellitus with hyperglycemia: Secondary | ICD-10-CM | POA: Diagnosis present

## 2024-09-21 DIAGNOSIS — E876 Hypokalemia: Secondary | ICD-10-CM | POA: Diagnosis present

## 2024-09-21 DIAGNOSIS — G8929 Other chronic pain: Secondary | ICD-10-CM | POA: Diagnosis present

## 2024-09-21 DIAGNOSIS — T380X5A Adverse effect of glucocorticoids and synthetic analogues, initial encounter: Secondary | ICD-10-CM | POA: Diagnosis present

## 2024-09-21 DIAGNOSIS — J180 Bronchopneumonia, unspecified organism: Secondary | ICD-10-CM | POA: Diagnosis present

## 2024-09-21 DIAGNOSIS — I2489 Other forms of acute ischemic heart disease: Secondary | ICD-10-CM | POA: Diagnosis present

## 2024-09-21 DIAGNOSIS — Z87891 Personal history of nicotine dependence: Secondary | ICD-10-CM

## 2024-09-21 DIAGNOSIS — Z9981 Dependence on supplemental oxygen: Secondary | ICD-10-CM

## 2024-09-21 DIAGNOSIS — E1140 Type 2 diabetes mellitus with diabetic neuropathy, unspecified: Secondary | ICD-10-CM | POA: Diagnosis present

## 2024-09-21 DIAGNOSIS — J111 Influenza due to unidentified influenza virus with other respiratory manifestations: Secondary | ICD-10-CM

## 2024-09-21 DIAGNOSIS — I5033 Acute on chronic diastolic (congestive) heart failure: Secondary | ICD-10-CM | POA: Diagnosis present

## 2024-09-21 LAB — CBC WITH DIFFERENTIAL/PLATELET
Abs Immature Granulocytes: 0.02 10*3/uL (ref 0.00–0.07)
Basophils Absolute: 0 10*3/uL (ref 0.0–0.1)
Basophils Relative: 1 %
Eosinophils Absolute: 0.1 10*3/uL (ref 0.0–0.5)
Eosinophils Relative: 2 %
HCT: 37.1 % — ABNORMAL LOW (ref 39.0–52.0)
Hemoglobin: 11.3 g/dL — ABNORMAL LOW (ref 13.0–17.0)
Immature Granulocytes: 0 %
Lymphocytes Relative: 15 %
Lymphs Abs: 1 10*3/uL (ref 0.7–4.0)
MCH: 29 pg (ref 26.0–34.0)
MCHC: 30.5 g/dL (ref 30.0–36.0)
MCV: 95.4 fL (ref 80.0–100.0)
Monocytes Absolute: 0.6 10*3/uL (ref 0.1–1.0)
Monocytes Relative: 9 %
Neutro Abs: 4.8 10*3/uL (ref 1.7–7.7)
Neutrophils Relative %: 73 %
Platelets: 172 10*3/uL (ref 150–400)
RBC: 3.89 MIL/uL — ABNORMAL LOW (ref 4.22–5.81)
RDW: 12.6 % (ref 11.5–15.5)
WBC: 6.5 10*3/uL (ref 4.0–10.5)
nRBC: 0 % (ref 0.0–0.2)

## 2024-09-21 LAB — COMPREHENSIVE METABOLIC PANEL WITH GFR
ALT: 19 U/L (ref 0–44)
AST: 23 U/L (ref 15–41)
Albumin: 3.5 g/dL (ref 3.5–5.0)
Alkaline Phosphatase: 65 U/L (ref 38–126)
Anion gap: 6 (ref 5–15)
BUN: 12 mg/dL (ref 8–23)
CO2: 40 mmol/L — ABNORMAL HIGH (ref 22–32)
Calcium: 8.7 mg/dL — ABNORMAL LOW (ref 8.9–10.3)
Chloride: 97 mmol/L — ABNORMAL LOW (ref 98–111)
Creatinine, Ser: 0.91 mg/dL (ref 0.61–1.24)
GFR, Estimated: >60 mL/min
Glucose, Bld: 96 mg/dL (ref 70–99)
Potassium: 3.8 mmol/L (ref 3.5–5.1)
Sodium: 143 mmol/L (ref 135–145)
Total Bilirubin: 0.4 mg/dL (ref 0.0–1.2)
Total Protein: 6.3 g/dL — ABNORMAL LOW (ref 6.5–8.1)

## 2024-09-21 LAB — BLOOD GAS, VENOUS
Acid-Base Excess: 16.4 mmol/L — ABNORMAL HIGH (ref 0.0–2.0)
Bicarbonate: 46.9 mmol/L — ABNORMAL HIGH (ref 20.0–28.0)
O2 Saturation: 64.8 %
Patient temperature: 37
pCO2, Ven: 89 mmHg (ref 44–60)
pH, Ven: 7.33 (ref 7.25–7.43)
pO2, Ven: 35 mmHg (ref 32–45)

## 2024-09-21 LAB — PROTIME-INR
INR: 1 (ref 0.8–1.2)
Prothrombin Time: 13.7 s (ref 11.4–15.2)

## 2024-09-21 LAB — TROPONIN T, HIGH SENSITIVITY: Troponin T High Sensitivity: 61 ng/L — ABNORMAL HIGH (ref 0–19)

## 2024-09-21 LAB — RESP PANEL BY RT-PCR (RSV, FLU A&B, COVID)  RVPGX2
Influenza A by PCR: NEGATIVE
Influenza B by PCR: POSITIVE — AB
Resp Syncytial Virus by PCR: NEGATIVE
SARS Coronavirus 2 by RT PCR: NEGATIVE

## 2024-09-21 LAB — PRO BRAIN NATRIURETIC PEPTIDE: Pro Brain Natriuretic Peptide: 1764 pg/mL — ABNORMAL HIGH

## 2024-09-21 LAB — LACTIC ACID, PLASMA: Lactic Acid, Venous: 0.5 mmol/L (ref 0.5–1.9)

## 2024-09-21 MED ORDER — METHYLPREDNISOLONE SODIUM SUCC 125 MG IJ SOLR
125.0000 mg | Freq: Once | INTRAMUSCULAR | Status: AC
  Administered 2024-09-21: 125 mg via INTRAVENOUS
  Filled 2024-09-21: qty 2

## 2024-09-21 MED ORDER — IOHEXOL 350 MG/ML SOLN
100.0000 mL | Freq: Once | INTRAVENOUS | Status: AC | PRN
  Administered 2024-09-21: 100 mL via INTRAVENOUS

## 2024-09-21 MED ORDER — MORPHINE SULFATE (PF) 4 MG/ML IV SOLN
4.0000 mg | Freq: Once | INTRAVENOUS | Status: AC
  Administered 2024-09-21: 4 mg via INTRAVENOUS
  Filled 2024-09-21: qty 1

## 2024-09-21 MED ORDER — AZITHROMYCIN 500 MG PO TABS
500.0000 mg | ORAL_TABLET | Freq: Once | ORAL | Status: AC
  Administered 2024-09-22: 500 mg via ORAL
  Filled 2024-09-21 (×2): qty 1

## 2024-09-21 MED ORDER — IPRATROPIUM-ALBUTEROL 0.5-2.5 (3) MG/3ML IN SOLN
3.0000 mL | Freq: Once | RESPIRATORY_TRACT | Status: AC
  Administered 2024-09-21: 3 mL via RESPIRATORY_TRACT
  Filled 2024-09-21: qty 3

## 2024-09-21 MED ORDER — SODIUM CHLORIDE 0.9 % IV SOLN
2.0000 g | Freq: Once | INTRAVENOUS | Status: AC
  Administered 2024-09-21: 2 g via INTRAVENOUS
  Filled 2024-09-21: qty 20

## 2024-09-21 NOTE — ED Provider Notes (Signed)
 Hospitalist unable to accept the patient.  Patient intermittently will use BiPAP.  He is awake fully alert and oriented and does not appear in distress or extremis at this time.  He does however have significant issues with compliance with the BiPAP.  Think he would be best served in the ICU where he could be more encouraged, watch carefully monitored etc.  He does not require acute intubation at this time in the ER.  However, we will defer to the ICU team for further consultation or possible admission.    Case has been sent in consult reviewed by Jenita Dannielle Fret.   Dicky Anes, MD 09/22/24 0100

## 2024-09-21 NOTE — ED Provider Notes (Signed)
 Patient refusing to take BiPAP at this time.  He is awake alert in no severe distress but he is agreeable to doing BiPAP with occasional breaks, reports the mask fit does not feel great he would prefer his home unit But when asked he reports no one in his house would bring it  At this juncture he is awake alert and he will be starting antibiotics for pneumonia.  I also tested positive for influenza but he reports symptoms for about 4 to 5 days, no clear indication for Tamiflu   Patient to be admitted to hospitalist service.    Dicky Anes, MD 09/21/24 [id number]

## 2024-09-21 NOTE — ED Provider Notes (Signed)
 "  [facility] Provider Note    Event Date/Time   First MD Initiated Contact with Patient 09/21/24 2140     (approximate)   History   Shortness of Breath (/)  No notes on file   HPI Cody Alexander is a 69 y.o. male PMH multiple medical comorbidities including CHF, COPD, chronic hypoxic respiratory failure on 3 L, hypertension, peripheral neuropathy presents for evaluation of shortness of breath -Per EMS, hypoxic to the 80s on their arrival.  Noted to have cough and congestion with shortness of breath over the past 2-3 days.  Afebrile.  Initially put on CPAP though started to not tolerate so transitioned to nonrebreather.  Received 1 round of DuoNeb treatment. -Per chart review, recently admitted 09/01/2024-09/09/2024 for acute on chronic hypercapnic respiratory failure, acute on chronic CHF exacerbation - On my evaluation, patient states he has been feeling unwell with cough, shortness of breath, chills over the past 4-5 days.  Does have sick contacts at home.  Notes worse than usual leg swelling bilaterally despite taking his Lasix .  Some chest pain with cough only, possible mild hemoptysis. -Separately notes he had 2 episodes of this he lost consciousness while at home over the past 2 days.  States he did fall back and hit his head on the tub with 1 episode.     Physical Exam   Triage Vital Signs: BP (!) 150/91   Pulse 92   Temp 99.1 F (37.3 C) (Oral)   Resp (!) 21   Ht 6' 1 (1.854 m)   Wt 134.6 kg   SpO2 99%   BMI 39.15 kg/m     Most recent vital signs: Vitals:   09/21/24 2151 09/21/24 2153  BP:  (!) 150/91  Pulse:  92  Resp:  (!) 21  Temp:  99.1 F (37.3 C)  SpO2: 99% 99%     General: Awake, tachypneic. CV:  Good peripheral perfusion. RRR, RP 2+ Resp:  Tachypneic though speaking full sentences.  Notable and expiratory wheezing throughout.  Some coarse breath sounds scattered. Abd:  No distention. Nontender to deep palpation  throughout. + Reducible umbilical hernia. Other:  Some bilateral lower extremity edema present.   ED Results / Procedures / Treatments   Labs (all labs ordered are listed, but only abnormal results are displayed) Labs Reviewed  RESP PANEL BY RT-PCR (RSV, FLU A&B, COVID)  RVPGX2 - Abnormal; Notable for the following components:      Result Value   Influenza B by PCR POSITIVE (*)    All other components within normal limits  COMPREHENSIVE METABOLIC PANEL WITH GFR - Abnormal; Notable for the following components:   Chloride 97 (*)    CO2 40 (*)    Calcium  8.7 (*)    Total Protein 6.3 (*)    All other components within normal limits  PRO BRAIN NATRIURETIC PEPTIDE - Abnormal; Notable for the following components:   Pro Brain Natriuretic Peptide 1,764.0 (*)    All other components within normal limits  CBC WITH DIFFERENTIAL/PLATELET - Abnormal; Notable for the following components:   RBC 3.89 (*)    Hemoglobin 11.3 (*)    HCT 37.1 (*)    All other components within normal limits  BLOOD GAS, VENOUS - Abnormal; Notable for the following components:   pCO2, Ven 89 (*)    Bicarbonate 46.9 (*)    Acid-Base Excess 16.4 (*)    All other components within normal limits  TROPONIN T, HIGH SENSITIVITY - Abnormal;  Notable for the following components:   Troponin T High Sensitivity 61 (*)    All other components within normal limits  TROPONIN T, HIGH SENSITIVITY - Abnormal; Notable for the following components:   Troponin T High Sensitivity 64 (*)    All other components within normal limits  LACTIC ACID, PLASMA  LACTIC ACID, PLASMA  PROTIME-INR  BLOOD GAS, VENOUS     EKG  Ecg = sinus rhythm, rate 90, no gross ST elevation or depression, no significant repolarization abnormality, left axis deviation, normal intervals.  No clear evidence of ischemia or arrhythmia on my interpretation.   RADIOLOGY Radiology interpreted by myself and radiology report reviewed.  No pulmonary embolism  noted.  Possible multifocal pneumonia.    PROCEDURES:  Critical Care performed: Yes, see critical care procedure note(s)  .Critical Care  Performed by: Clarine Ozell LABOR, MD Authorized by: Clarine Ozell LABOR, MD   Critical care provider statement:    Critical care time (minutes):  30   Critical care time was exclusive of:  Separately billable procedures and treating other patients   Critical care was necessary to treat or prevent imminent or life-threatening deterioration of the following conditions:  Respiratory failure   Critical care was time spent personally by me on the following activities:  Development of treatment plan with patient or surrogate, discussions with consultants, evaluation of patient's response to treatment, examination of patient, ordering and review of laboratory studies, ordering and review of radiographic studies, ordering and performing treatments and interventions, pulse oximetry, re-evaluation of patient's condition and review of old charts   I assumed direction of critical care for this patient from another provider in my specialty: no     Care discussed with: admitting provider      MEDICATIONS ORDERED IN ED: Medications  azithromycin  (ZITHROMAX ) tablet 500 mg (has no administration in time range)  ipratropium-albuterol  (DUONEB) 0.5-2.5 (3) MG/3ML nebulizer solution 3 mL (3 mLs Nebulization Given 09/21/24 2219)  methylPREDNISolone  sodium succinate (SOLU-MEDROL ) 125 mg/2 mL injection 125 mg (125 mg Intravenous Given 09/21/24 2248)  iohexol  (OMNIPAQUE ) 350 MG/ML injection 100 mL (100 mLs Intravenous Contrast Given 09/21/24 2302)  morphine  (PF) 4 MG/ML injection 4 mg (4 mg Intravenous Given 09/21/24 2353)  cefTRIAXone  (ROCEPHIN ) 2 g in sodium chloride  0.9 % 100 mL IVPB (2 g Intravenous New Bag/Given 09/21/24 2353)     IMPRESSION / MDM / ASSESSMENT AND PLAN / ED COURSE  I reviewed the triage vital signs and the nursing notes.                               DDX/MDM/AP: Differential diagnosis includes, but is not limited to, COPD exacerbation, consider component of CHF, high concern for underlying viral syndrome including influenza or COVID-19, consider underlying pneumonia, consider PE.  Regarding syncopal episodes, consider possible intracranial injury, no clinical concern for C-spine injury with no neck pain on my eval.  Plan: - Supplemental oxygen - DuoNeb, IV steroids - Labs - EKG - Chest x-ray - CT head - Anticipate admission  Patient's presentation is most consistent with acute presentation with potential threat to life or bodily function.  The patient is on the cardiac monitor to evaluate for evidence of arrhythmia and/or significant heart rate changes.  ED course below.  Patient tested positive for influenza.  Very mild troponin elevation.  CT PE with no PE though evidence of multifocal pneumonia, treated with ceftriaxone  and azithromycin .  VBG with notable  pCO2 elevation though bicarb markedly elevated, not acidotic, suspect some chronic element of retention.  Remains mentating quite well here, speaking full sentences with normal mentation with me.  Discussed with hospitalist who would like repeat VBG to be downtrending prior to admission to hospitalist service as opposed to ICU should intubation be warranted.  Signed out to oncoming ED provider Dr. Dicky pending repeat VBG  Clinical Course as of 09/22/24 0047  Tue Sep 21, 2024  2206 CXR: IMPRESSION: 1. Bibasilar atelectatic changes. 2. Mild vascular congestion without edema. 3. Cardiomegaly.   [MM]  2337 Influenza B By PCR(!): POSITIVE [MM]  2338 CTA: IMPRESSION: 1. No acute intracranial abnormality. 2. Sinus disease.   [MM]  2338 CTA chest: IMPRESSION: 1. No pulmonary embolism. 2. Diffuse bronchial wall thickening with mucus plugging and associated patchy bilateral lower lobe and lingular airspace opacities suggestive of multifocal bronchopneumonia. Recommend  repeat CT in 3 months to evaluate for complete resolution. 3. Enlarged main pulmonary artery measuring up to 3.6 cm. 4. Prominent heart size.   [MM]  Wed Sep 22, 2024  0027 Discussed with hospitalist.  Request repeat VBG to ensure pCO2 appropriately downtrending prior to admission to the hospital service. [MM]    Clinical Course User Index [MM] Clarine Ozell LABOR, MD     FINAL CLINICAL IMPRESSION(S) / ED DIAGNOSES   Final diagnoses:  COPD exacerbation (HCC)  Acute on chronic respiratory failure with hypoxia and hypercapnia (HCC)  Influenza  Multifocal pneumonia     Rx / DC Orders   ED Discharge Orders     None        Note:  This document was prepared using Dragon voice recognition software and may include unintentional dictation errors.   Clarine Ozell LABOR, MD 09/22/24 [phone]  "

## 2024-09-22 ENCOUNTER — Other Ambulatory Visit: Payer: Self-pay

## 2024-09-22 DIAGNOSIS — J189 Pneumonia, unspecified organism: Secondary | ICD-10-CM

## 2024-09-22 DIAGNOSIS — I5033 Acute on chronic diastolic (congestive) heart failure: Secondary | ICD-10-CM | POA: Diagnosis not present

## 2024-09-22 DIAGNOSIS — J9621 Acute and chronic respiratory failure with hypoxia: Secondary | ICD-10-CM | POA: Diagnosis present

## 2024-09-22 DIAGNOSIS — I110 Hypertensive heart disease with heart failure: Secondary | ICD-10-CM | POA: Diagnosis not present

## 2024-09-22 DIAGNOSIS — J441 Chronic obstructive pulmonary disease with (acute) exacerbation: Secondary | ICD-10-CM | POA: Diagnosis not present

## 2024-09-22 DIAGNOSIS — J101 Influenza due to other identified influenza virus with other respiratory manifestations: Secondary | ICD-10-CM

## 2024-09-22 DIAGNOSIS — J111 Influenza due to unidentified influenza virus with other respiratory manifestations: Secondary | ICD-10-CM

## 2024-09-22 DIAGNOSIS — Z794 Long term (current) use of insulin: Secondary | ICD-10-CM | POA: Diagnosis not present

## 2024-09-22 DIAGNOSIS — R7989 Other specified abnormal findings of blood chemistry: Secondary | ICD-10-CM

## 2024-09-22 DIAGNOSIS — J9622 Acute and chronic respiratory failure with hypercapnia: Secondary | ICD-10-CM

## 2024-09-22 DIAGNOSIS — G4733 Obstructive sleep apnea (adult) (pediatric): Secondary | ICD-10-CM

## 2024-09-22 DIAGNOSIS — E1142 Type 2 diabetes mellitus with diabetic polyneuropathy: Secondary | ICD-10-CM

## 2024-09-22 LAB — BASIC METABOLIC PANEL WITH GFR
Anion gap: 15 (ref 5–15)
BUN: 16 mg/dL (ref 8–23)
CO2: 33 mmol/L — ABNORMAL HIGH (ref 22–32)
Calcium: 8.9 mg/dL (ref 8.9–10.3)
Chloride: 94 mmol/L — ABNORMAL LOW (ref 98–111)
Creatinine, Ser: 1.09 mg/dL (ref 0.61–1.24)
GFR, Estimated: >60 mL/min
Glucose, Bld: 273 mg/dL — ABNORMAL HIGH (ref 70–99)
Potassium: 4 mmol/L (ref 3.5–5.1)
Sodium: 143 mmol/L (ref 135–145)

## 2024-09-22 LAB — BLOOD GAS, VENOUS
Acid-Base Excess: 13.5 mmol/L — ABNORMAL HIGH (ref 0.0–2.0)
Acid-Base Excess: 13.7 mmol/L — ABNORMAL HIGH (ref 0.0–2.0)
Acid-Base Excess: 15.4 mmol/L — ABNORMAL HIGH (ref 0.0–2.0)
Acid-Base Excess: 8.2 mmol/L — ABNORMAL HIGH (ref 0.0–2.0)
Bicarbonate: 39 mmol/L — ABNORMAL HIGH (ref 20.0–28.0)
Bicarbonate: 44.7 mmol/L — ABNORMAL HIGH (ref 20.0–28.0)
Bicarbonate: 45 mmol/L — ABNORMAL HIGH (ref 20.0–28.0)
Bicarbonate: 46.3 mmol/L — ABNORMAL HIGH (ref 20.0–28.0)
Delivery systems: POSITIVE
Delivery systems: POSITIVE
FIO2: 50 %
FIO2: 40 %
O2 Saturation: 44 %
O2 Saturation: 73.9 %
O2 Saturation: 80.1 %
O2 Saturation: 81.5 %
PEEP: 5 cmH2O
PEEP: 5 cmH2O
Patient temperature: 37
Patient temperature: 37
Patient temperature: 37
Patient temperature: 37
Pressure control: 8 cmH2O
Pressure control: 10 cmH2O
RATE: 15 {breaths}/min
pCO2, Ven: 89 mmHg (ref 44–60)
pCO2, Ven: 92 mmHg (ref 44–60)
pCO2, Ven: 93 mmHg (ref 44–60)
pCO2, Ven: 98 mmHg (ref 44–60)
pH, Ven: 7.25 (ref 7.25–7.43)
pH, Ven: 7.27 (ref 7.25–7.43)
pH, Ven: 7.29 (ref 7.25–7.43)
pH, Ven: 7.31 (ref 7.25–7.43)
pO2, Ven: 43 mmHg (ref 32–45)
pO2, Ven: 49 mmHg — ABNORMAL HIGH (ref 32–45)
pO2, Ven: 50 mmHg — ABNORMAL HIGH (ref 32–45)
pO2, Ven: <31 mmHg — CL (ref 32–45)

## 2024-09-22 LAB — CBC
HCT: 40.8 % (ref 39.0–52.0)
Hemoglobin: 12.1 g/dL — ABNORMAL LOW (ref 13.0–17.0)
MCH: 28.9 pg (ref 26.0–34.0)
MCHC: 29.7 g/dL — ABNORMAL LOW (ref 30.0–36.0)
MCV: 97.6 fL (ref 80.0–100.0)
Platelets: 176 10*3/uL (ref 150–400)
RBC: 4.18 MIL/uL — ABNORMAL LOW (ref 4.22–5.81)
RDW: 12.6 % (ref 11.5–15.5)
WBC: 8.1 10*3/uL (ref 4.0–10.5)
nRBC: 0 % (ref 0.0–0.2)

## 2024-09-22 LAB — LACTIC ACID, PLASMA: Lactic Acid, Venous: 0.6 mmol/L (ref 0.5–1.9)

## 2024-09-22 LAB — CBG MONITORING, ED: Glucose-Capillary: 228 mg/dL — ABNORMAL HIGH (ref 70–99)

## 2024-09-22 LAB — GLUCOSE, CAPILLARY
Glucose-Capillary: 256 mg/dL — ABNORMAL HIGH (ref 70–99)
Glucose-Capillary: 301 mg/dL — ABNORMAL HIGH (ref 70–99)
Glucose-Capillary: 263 mg/dL — ABNORMAL HIGH (ref 70–99)
Glucose-Capillary: 230 mg/dL — ABNORMAL HIGH (ref 70–99)
Glucose-Capillary: 187 mg/dL — ABNORMAL HIGH (ref 70–99)
Glucose-Capillary: 178 mg/dL — ABNORMAL HIGH (ref 70–99)

## 2024-09-22 LAB — CREATININE, SERUM
Creatinine, Ser: 1.06 mg/dL (ref 0.61–1.24)
GFR, Estimated: >60 mL/min

## 2024-09-22 LAB — TROPONIN T, HIGH SENSITIVITY
Troponin T High Sensitivity: 48 ng/L — ABNORMAL HIGH (ref 0–19)
Troponin T High Sensitivity: 64 ng/L — ABNORMAL HIGH (ref 0–19)

## 2024-09-22 LAB — PROCALCITONIN: Procalcitonin: 0.12 ng/mL

## 2024-09-22 LAB — PRO BRAIN NATRIURETIC PEPTIDE: Pro Brain Natriuretic Peptide: 1978 pg/mL — ABNORMAL HIGH

## 2024-09-22 LAB — MRSA NEXT GEN BY PCR, NASAL: MRSA by PCR Next Gen: NOT DETECTED

## 2024-09-22 LAB — PHOSPHORUS: Phosphorus: 4.1 mg/dL (ref 2.5–4.6)

## 2024-09-22 LAB — MAGNESIUM: Magnesium: 1.9 mg/dL (ref 1.7–2.4)

## 2024-09-22 MED ORDER — OSELTAMIVIR PHOSPHATE 75 MG PO CAPS
75.0000 mg | ORAL_CAPSULE | Freq: Two times a day (BID) | ORAL | Status: DC
  Administered 2024-09-22 – 2024-09-26 (×9): 75 mg via ORAL
  Filled 2024-09-22 (×10): qty 1

## 2024-09-22 MED ORDER — FUROSEMIDE 10 MG/ML IJ SOLN
40.0000 mg | Freq: Two times a day (BID) | INTRAMUSCULAR | Status: DC
  Administered 2024-09-22 – 2024-09-26 (×8): 40 mg via INTRAVENOUS
  Filled 2024-09-22 (×8): qty 4

## 2024-09-22 MED ORDER — SODIUM CHLORIDE 0.9 % IV SOLN
2.0000 g | INTRAVENOUS | Status: AC
  Administered 2024-09-23 – 2024-09-25 (×4): 2 g via INTRAVENOUS
  Filled 2024-09-22 (×4): qty 20

## 2024-09-22 MED ORDER — AZITHROMYCIN 250 MG PO TABS
500.0000 mg | ORAL_TABLET | Freq: Every day | ORAL | Status: AC
  Administered 2024-09-23 – 2024-09-26 (×4): 500 mg via ORAL
  Filled 2024-09-22 (×4): qty 2

## 2024-09-22 MED ORDER — INSULIN ASPART 100 UNIT/ML IJ SOLN
0.0000 [IU] | INTRAMUSCULAR | Status: DC
  Administered 2024-09-22: 5 [IU] via SUBCUTANEOUS
  Administered 2024-09-22: 11 [IU] via SUBCUTANEOUS
  Administered 2024-09-22: 5 [IU] via SUBCUTANEOUS
  Administered 2024-09-22: 3 [IU] via SUBCUTANEOUS
  Administered 2024-09-22: 8 [IU] via SUBCUTANEOUS
  Administered 2024-09-23: 11 [IU] via SUBCUTANEOUS
  Administered 2024-09-23: 3 [IU] via SUBCUTANEOUS
  Administered 2024-09-23: 5 [IU] via SUBCUTANEOUS
  Administered 2024-09-23: 3 [IU] via SUBCUTANEOUS
  Administered 2024-09-23: 2 [IU] via SUBCUTANEOUS
  Administered 2024-09-23: 3 [IU] via SUBCUTANEOUS
  Administered 2024-09-24: 2 [IU] via SUBCUTANEOUS
  Administered 2024-09-24 (×2): 8 [IU] via SUBCUTANEOUS
  Administered 2024-09-24: 2 [IU] via SUBCUTANEOUS
  Administered 2024-09-24 – 2024-09-25 (×2): 5 [IU] via SUBCUTANEOUS
  Administered 2024-09-25: 11 [IU] via SUBCUTANEOUS
  Administered 2024-09-25: 3 [IU] via SUBCUTANEOUS
  Administered 2024-09-25: 5 [IU] via SUBCUTANEOUS
  Administered 2024-09-25: 2 [IU] via SUBCUTANEOUS
  Administered 2024-09-25: 3 [IU] via SUBCUTANEOUS
  Administered 2024-09-26: 5 [IU] via SUBCUTANEOUS
  Administered 2024-09-26: 2 [IU] via SUBCUTANEOUS
  Administered 2024-09-26: 5 [IU] via SUBCUTANEOUS
  Administered 2024-09-26: 2 [IU] via SUBCUTANEOUS
  Filled 2024-09-22: qty 6
  Filled 2024-09-22: qty 3
  Filled 2024-09-22: qty 5
  Filled 2024-09-22: qty 3
  Filled 2024-09-22: qty 8
  Filled 2024-09-22: qty 5
  Filled 2024-09-22: qty 2
  Filled 2024-09-22: qty 11
  Filled 2024-09-22: qty 3
  Filled 2024-09-22: qty 11
  Filled 2024-09-22 (×2): qty 5
  Filled 2024-09-22: qty 3
  Filled 2024-09-22: qty 5
  Filled 2024-09-22: qty 2
  Filled 2024-09-22: qty 11
  Filled 2024-09-22: qty 3
  Filled 2024-09-22: qty 5
  Filled 2024-09-22: qty 2
  Filled 2024-09-22: qty 5
  Filled 2024-09-22: qty 3
  Filled 2024-09-22: qty 5
  Filled 2024-09-22 (×3): qty 2
  Filled 2024-09-22: qty 8

## 2024-09-22 MED ORDER — CHLORHEXIDINE GLUCONATE CLOTH 2 % EX PADS
6.0000 | MEDICATED_PAD | Freq: Every day | CUTANEOUS | Status: DC
  Administered 2024-09-23 – 2024-09-24 (×2): 6 via TOPICAL
  Filled 2024-09-22: qty 6

## 2024-09-22 MED ORDER — ACETAMINOPHEN 10 MG/ML IV SOLN
1000.0000 mg | Freq: Four times a day (QID) | INTRAVENOUS | Status: DC | PRN
  Administered 2024-09-22: 1000 mg via INTRAVENOUS

## 2024-09-22 MED ORDER — SODIUM CHLORIDE 0.9 % IV SOLN
100.0000 mg | Freq: Two times a day (BID) | INTRAVENOUS | Status: DC
  Filled 2024-09-22: qty 100

## 2024-09-22 MED ORDER — HYDRALAZINE HCL 20 MG/ML IJ SOLN
10.0000 mg | Freq: Four times a day (QID) | INTRAMUSCULAR | Status: DC | PRN
  Administered 2024-09-24: 20 mg via INTRAVENOUS
  Filled 2024-09-22: qty 1

## 2024-09-22 MED ORDER — ALBUTEROL (5 MG/ML) CONTINUOUS INHALATION SOLN
7.5000 mg/h | INHALATION_SOLUTION | RESPIRATORY_TRACT | Status: AC
  Administered 2024-09-22: 7.5 mg/h via RESPIRATORY_TRACT
  Filled 2024-09-22: qty 20

## 2024-09-22 MED ORDER — IPRATROPIUM-ALBUTEROL 0.5-2.5 (3) MG/3ML IN SOLN: 3.0000 mL | Freq: Four times a day (QID) | RESPIRATORY_TRACT | Status: DC | PRN

## 2024-09-22 MED ORDER — HEPARIN SODIUM (PORCINE) 5000 UNIT/ML IJ SOLN
5000.0000 [IU] | Freq: Three times a day (TID) | INTRAMUSCULAR | Status: DC
  Filled 2024-09-22 (×4): qty 1

## 2024-09-22 MED ORDER — FUROSEMIDE 10 MG/ML IJ SOLN: 40.0000 mg | Freq: Every day | INTRAMUSCULAR | Status: DC

## 2024-09-22 MED ORDER — OXYCODONE HCL 5 MG PO TABS
5.0000 mg | ORAL_TABLET | Freq: Four times a day (QID) | ORAL | Status: DC | PRN
  Administered 2024-09-22 – 2024-09-24 (×8): 5 mg via ORAL
  Filled 2024-09-22 (×8): qty 1

## 2024-09-22 MED ORDER — INSULIN GLARGINE-YFGN 100 UNIT/ML ~~LOC~~ SOLN
10.0000 [IU] | Freq: Every day | SUBCUTANEOUS | Status: DC
  Administered 2024-09-22 – 2024-09-26 (×5): 10 [IU] via SUBCUTANEOUS
  Filled 2024-09-22 (×5): qty 0.1

## 2024-09-22 MED ORDER — FENTANYL CITRATE (PF) 50 MCG/ML IJ SOSY
12.5000 ug | PREFILLED_SYRINGE | INTRAMUSCULAR | Status: DC | PRN
  Administered 2024-09-22 – 2024-09-26 (×8): 12.5 ug via INTRAVENOUS
  Filled 2024-09-22 (×9): qty 1

## 2024-09-22 MED ORDER — IPRATROPIUM-ALBUTEROL 0.5-2.5 (3) MG/3ML IN SOLN
3.0000 mL | Freq: Four times a day (QID) | RESPIRATORY_TRACT | Status: DC
  Administered 2024-09-22 – 2024-09-23 (×5): 3 mL via RESPIRATORY_TRACT
  Filled 2024-09-22 (×5): qty 3

## 2024-09-22 MED ORDER — BUDESONIDE 0.25 MG/2ML IN SUSP
0.2500 mg | Freq: Two times a day (BID) | RESPIRATORY_TRACT | Status: DC
  Administered 2024-09-22 – 2024-09-26 (×8): 0.25 mg via RESPIRATORY_TRACT
  Filled 2024-09-22 (×8): qty 2

## 2024-09-22 MED ORDER — SENNA 8.6 MG PO TABS: 1.0000 | ORAL_TABLET | Freq: Two times a day (BID) | ORAL | Status: DC | PRN

## 2024-09-22 MED ORDER — MORPHINE SULFATE (PF) 2 MG/ML IV SOLN
2.0000 mg | Freq: Once | INTRAVENOUS | Status: AC
  Administered 2024-09-22: 2 mg via INTRAVENOUS
  Filled 2024-09-22: qty 1

## 2024-09-22 MED ORDER — ACETAMINOPHEN 10 MG/ML IV SOLN
1000.0000 mg | Freq: Once | INTRAVENOUS | Status: DC
  Filled 2024-09-22: qty 100

## 2024-09-22 MED ORDER — FUROSEMIDE 10 MG/ML IJ SOLN
40.0000 mg | Freq: Every day | INTRAMUSCULAR | Status: DC
  Administered 2024-09-22: 40 mg via INTRAVENOUS
  Filled 2024-09-22: qty 4

## 2024-09-22 MED ORDER — POLYETHYLENE GLYCOL 3350 17 G PO PACK: 17.0000 g | PACK | Freq: Every day | ORAL | Status: DC | PRN

## 2024-09-22 NOTE — Progress Notes (Addendum)
 SPIRITUAL CARE AND COUNSELING CONSULT NOTE   VISIT SUMMARY Initial visit for spiritual/emotional support while on call. Chaplain consulted with nurse team.   ZELPHIA GUILE                                                                                                                                                                      Type of Visit: Initial Care provided to:: Patient Referral source: Nurse (RN/NT/LPN) Reason for visit: Routine spiritual support OnCall Visit: Yes   SPIRITUAL FRAMEWORK  Presenting Themes: Values and beliefs, Coping tools, Meaning/purpose/sources of inspiration Community/Connection: Family, Friend(s) Patient Stress Factors: Health changes, Major life changes Family Stress Factors: None identified   GOALS   Self/Personal Goals: healing Clinical Care Goals: healing   INTERVENTIONS   Spiritual Care Interventions Made: Compassionate presence, Reflective listening, Meaning making, Prayer, Explored values/beliefs/practices/strengths    INTERVENTION OUTCOMES   Outcomes: Awareness around self/spiritual resourses, Connection to spiritual care, Awareness of support  Chaplain provided compassionate presence, reflective listening, open ended questions, and meaning oriented conversation to elicit Kayshaun feelings about health status, treatment plan, Sherlean faith background, family, and desired outcomes. Chaplain provided prayer upon request.   SPIRITUAL CARE PLAN   Spiritual Care Issues Still Outstanding: Chaplain will continue to follow    If immediate needs arise, please contact [facility] 24 hour on call [phone]   Barabara Chess, Chaplain  09/22/2024 10:21 PM

## 2024-09-22 NOTE — Progress Notes (Signed)
"           °  Progress Note   Asked by EDP to evaluate patient for ICU admission. Patient presented with main complaint of shortness of breath. He has chronic respiratory failure secondary to COPD and CHF, for which he wears 3 liters of oxygen at home.  Upon arrival to ED he was on a non-rebreather after found to be hypoxic in the 80's upon EMS arrival to his home. He received 1 round of DuoNeb treatment prior to arrival.  Upon bedside assessment, patient is A&O x 4- non-toxic appearing- without dyspnea on BIPAP. He was able to speak and answer questions. He agreed to wearing the BiPAP throughout the night and for as long as needed to assist his recovery. He voiced he did not want to be sedated to wear the BiPAP, nor did he want to be intubated. He understood these options and happily agreed to continuing wearing the BiPAP until discontinued per the provider's discretion. Vitals are stable, and he is not on vasopressor support. As labs are improving, patient does not meet ICU admission criteria at this time. Please re-consult if needed in the future.    Haddy Mullinax, PA-C [facility] Team Contact Info: [phone]  "

## 2024-09-22 NOTE — Progress Notes (Signed)
 RN made NP Shellia aware of VBG results. NP and Dr. Isadora came to bedside to adjust BiPAP settings.

## 2024-09-22 NOTE — Progress Notes (Signed)
 RN made Shellia, NP aware that patient is refusing heparin  subcutaneous. NP acknowledged, no new orders given.

## 2024-09-22 NOTE — ED Triage Notes (Signed)
 Pt arrives via [facility] from home with c/o shortness of breath and cough. Pts O2 saturation was in the 80s upon EMS arrival, and the pt was placed on a non-rebreather. Pt has a history of CHF and COPD.

## 2024-09-22 NOTE — H&P (Signed)
 "  NAME:  Cody Alexander, MRN:  [id number], DOB:  06-30-1956, LOS: 0 ADMISSION DATE:  09/21/2024, CONSULTATION DATE:  09/22/24 REFERRING MD:  Dr. Dicky, CHIEF COMPLAINT:  Shortness of breath   Brief Synopsis:  69 year old male with a history of HFpEF, COPD and hypertension admitted for acute on chronic respiratory failure secondary to an acute COPD exacerbation in the setting of Influenza B.  History of Present Illness:  Cody Alexander is a 69 year old male with a past medical history significant for hypertension, HFpEF (EF 60-65%), COPD on 3L O2 at baseline and Type II Diabetes Mellitus who presented to [facility] ED via EMS with concern of shortness of breath.  History is gathered per chart review  Per patient, he has been feeling short of breath with some cough and congestion for the previous 3-4 days. He reports sick contacts at home. Per EMS, Mr. Alipio was hypoxic in the 80's upon their arrival to his home. He was initially placed on CPAP but did not tolerate it well, therefore was transitioned to non-rebreather. En route to [facility] he received one DuoNeb treatment.   ED course: Upon arrival to [facility], he was placed on HFNC intermittently but ended up requiring BiPAP. He was given a continuous nebulizer treatment. He was administered morphine  for pain and given 125mg  of solumedrol IV.    Vitals on Arrival: Temp: 99.56F  BP: 150/91  HR: 92 RR: 21 SpO2: 99% on HFNC  Pertinent Labs/Diagnostics: Found to have slightly elevated troponin likely due to demand ischemia with hypercapnic respiratory failure. Other labs largely unremarkable.  LILLETTE Robet Kim, PA-C personally viewed and interpreted this ECG. EKG Interpretation: Date: 09/21/24, EKG Time: 0042, Rate: 90, Rhythm: sinus rhythm, QRS Axis:  normal Intervals: shortened PR interval, ST/T Wave abnormalities: no ST elevation, Narrative Interpretation: abnormal EKG  Chemistry:  Na+:143 K+: 3.8 BUN/Cr.: 12/0.91 Serum CO2/ AG: 40/6 Glucose:  96  Hematology:  WBC: 6.5 Hgb: 11.3 Platelets 172 Troponin: 61 BNP: 1764 Lactic/ PCT: 0.5/0.12 VBG: pH 7.33, pCO2 89, pO2 35, Acid base excess 16.4, bicarb 46.9  ID: COVID-19 & Influenza A/B: Influenza B + MRSA PCR: pending  CXR Impression :  1. Bibasilar atelectatic changes. 2. Mild vascular congestion without edema. 3. Cardiomegaly.  CT Head Impression:  1. No acute intracranial abnormality. 2. Sinus disease.  CTA Chest PE Impression: 1. No pulmonary embolism. 2. Diffuse bronchial wall thickening with mucus plugging and associated patchy bilateral lower lobe and lingular airspace opacities suggestive of multifocal bronchopneumonia. Recommend repeat CT in 3 months to evaluate for complete resolution. 3. Enlarged main pulmonary artery measuring up to 3.6 cm. 4. Prominent heart size.   Medications Administered:  ABX: ceftriaxone  Duonebs Morphine  Solumedrol 125mg  x 1  PCCM consulted for admission due to hypoxic respiratory failure.   Pertinent Medical History  Hypertension COPD on 3L O2 at home Congestive Heart Failure Type II Diabetes Mellitus  Significant Hospital Events: Including procedures, antibiotic start and stop dates in addition to other pertinent events   09/22/24: Admitted for acute on chronic respiratory failure requiring BiPAP.   Interim History / Subjective:  Repeat VBG with worsening hypercapnia. Remains on BiPAP. Adjusted settings to increase his pressure. Remains alert and oriented.  Objective    Blood pressure (!) 153/97, pulse 90, temperature 99.2 F (37.3 C), resp. rate 19, height 6' 1 (1.854 m), weight 134.6 kg, SpO2 96%.    FiO2 (%):  [40 %-50 %] 40 % PEEP:  [5 cmH20] 5 cmH20 Pressure Support:  [  8 cmH20-10 cmH20] 10 cmH20  No intake or output data in the 24 hours ending 09/22/24 0332 Filed Weights   09/21/24 2154  Weight: 134.6 kg    Examination: General: Adult male, acutely ill, lying in bed, on BiPAP in NAD HENT: anicteric  sclerae, atraumatic, normocephalic, neck supple, no JVD CV: RRR, S1 S2, NSR on monitor, no r/m/g Pulm: Regular, non labored on BiPAP, expiratory wheezing and scattered coarse breath sounds throughout GI: soft, non-tender, non-distended, no rebound/guarding, bowel sounds x 4 Extremities: warm/dry, pulses + 2 R/P, 1+ edema noted Neuro: A&O x 4, follows commands, no focal neuro deficits, PERRL  GU: deferred   Resolved problem list   Assessment and Plan   #Acute Hypoxic and Hypercapnic Respiratory Failure secondary to COPD Exacerbation in the setting of Influenza B #COPD #Community Acquired Pneumonia Hx: COPD on 3L [place] at vaseline - Supplemental oxygen to maintain SpO2 >88% - Continue BiPAP as tolerated - Wean as able - Scheduled Duonebs Q6h - Intermittent chest x-ray & ABG/VBG PRN - F/u cultures, trend PCT (0.12 on admission) - Continue CAP ABX: doxycycline  and ceftriaxone   #Acute on Chronic Diastolic Heart Failure #HFpEF Exacerbation (09/03/24 LVEF 60-65%) #Hypertension #Elevated Troponin suspected s/t Demand Ischemia - Continuous cardiac monitoring - Vasopressors to maintain MAP goal >65 ~ not currently requiring - IV hydralazine  as indicated (SBP >165 or DBP >110) - Restart home meds as able - Daily weights to assess volume status - Trend BMP and replace electrolytes as indicated - Ensure Mg >2 and K >4 - Diurese with the use of IV Lasix  40mg  daily - BNP: 1764 on admission - Troponin 61 on admission >> 64  #VTE Prophylaxis - Transfuse if HGB <7 or active bleeding - Trend CBC - Monitor for signs and symptoms of bleeding - DVT Prophylaxis: SQ heparin   #GI - Diet: NPO - Constipation protocol prn  #Type II Diabetes Mellitus - ICU hypo/hyperglycemia protocol - CBG q4h - SSI: Novolog  - Goal Range: 140-180 - Diabetes Coordinator consult; appreciate input  #Peripheral Neuropathy - Start home meds once able to tolerate po  - Control pain as able   Labs    CBC: Recent Labs  Lab 09/21/24 2203  WBC 6.5  NEUTROABS 4.8  HGB 11.3*  HCT 37.1*  MCV 95.4  PLT 172    Basic Metabolic Panel: Recent Labs  Lab 09/21/24 2203  NA 143  K 3.8  CL 97*  CO2 40*  GLUCOSE 96  BUN 12  CREATININE 0.91  CALCIUM  8.7*   GFR: Estimated Creatinine Clearance: 113.4 mL/min (by C-G formula based on SCr of 0.91 mg/dL). Recent Labs  Lab 09/21/24 2203 09/21/24 2344  WBC 6.5  --   LATICACIDVEN 0.5 0.6    Liver Function Tests: Recent Labs  Lab 09/21/24 2203  AST 23  ALT 19  ALKPHOS 65  BILITOT 0.4  PROT 6.3*  ALBUMIN 3.5   No results for input(s): LIPASE, AMYLASE in the last 168 hours. No results for input(s): AMMONIA in the last 168 hours.  ABG    Component Value Date/Time   PHART 7.4 09/06/2024 0600   PCO2ART 79 (HH) 09/06/2024 0600   PO2ART 82 (L) 09/06/2024 0600   HCO3 45.0 (H) 09/22/2024 0258   O2SAT 80.1 09/22/2024 0258     Coagulation Profile: Recent Labs  Lab 09/21/24 2203  INR 1.0    Cardiac Enzymes: No results for input(s): CKTOTAL, CKMB, CKMBINDEX, TROPONINI in the last 168 hours.  HbA1C: Hemoglobin A1C  Date/Time Value  Ref Range Status  08/12/2013 04:01 AM 6.9 (H) 4.2 - 6.3 % Final    Comment:    The American Diabetes Association recommends that a primary goal of therapy should be <7% and that physicians should reevaluate the treatment regimen in patients with HbA1c values consistently >8%.    Hgb A1c MFr Bld  Date/Time Value Ref Range Status  09/01/2024 02:07 PM 7.6 (H) 4.8 - 5.6 % Final    Comment:    (NOTE) Diagnosis of Diabetes The following HbA1c ranges recommended by the American Diabetes Association (ADA) may be used as an aid in the diagnosis of diabetes mellitus.  Hemoglobin             Suggested A1C NGSP%              Diagnosis  <5.7                   Non Diabetic  5.7-6.4                Pre-Diabetic  >6.4                   Diabetic  <7.0                   Glycemic  control for                       adults with diabetes.    07/23/2023 06:25 AM 10.5 (H) 4.8 - 5.6 % Final    Comment:    (NOTE) Pre diabetes:          5.7%-6.4%  Diabetes:              >6.4%  Glycemic control for   <7.0% adults with diabetes     CBG: No results for input(s): GLUCAP in the last 168 hours.  Review of Systems:   Review of Systems  Constitutional:  Positive for malaise/fatigue. Negative for chills and fever.  Respiratory:  Positive for cough, shortness of breath and wheezing. Negative for sputum production.   Cardiovascular:  Positive for chest pain.  Gastrointestinal:  Negative for abdominal pain, diarrhea, heartburn, nausea and vomiting.  Neurological:  Negative for dizziness, loss of consciousness, weakness and headaches.     Past Medical History:  He,  has a past medical history of CHF (congestive heart failure) (HCC), COPD (chronic obstructive pulmonary disease) (HCC), Diabetes mellitus without complication (HCC), Hypertension, and Neuropathy.   Surgical History:   Past Surgical History:  Procedure Laterality Date   LEFT HEART CATH AND CORONARY ANGIOGRAPHY Right 02/27/2018   Procedure: LEFT HEART CATH AND CORONARY ANGIOGRAPHY;  Surgeon: Fernand Denyse LABOR, MD;  Location: [facility] INVASIVE CV LAB;  Service: Cardiovascular;  Laterality: Right;     Social History:   reports that he has quit smoking. His smoking use included cigarettes. He has never used smokeless tobacco. He reports that he does not drink alcohol and does not use drugs.   Family History:  His family history includes Hypertension in his father; Stroke in his mother.   Allergies Allergies[1]   Home Medications  Prior to Admission medications  Medication Sig Start Date End Date Taking? Authorizing Provider  acetaminophen  (TYLENOL ) 500 MG tablet Take 500 mg by mouth every 8 (eight) hours as needed for mild pain (pain score 1-3) or moderate pain (pain score 4-6).   Yes [provider]   albuterol  (PROVENTIL  HFA) 108 (90 Base) MCG/ACT inhaler Inhale 2  puffs into the lungs every 4 (four) hours as needed. 06/19/23  Yes Awanda City, MD  aspirin  81 MG chewable tablet Chew 1 tablet (81 mg total) by mouth daily. 09/10/24 10/10/24 Yes Maree Hue, MD  atorvastatin  (LIPITOR ) 80 MG tablet Take 1 tablet (80 mg total) by mouth daily. 09/09/24 10/09/24 Yes Maree Hue, MD  bumetanide  (BUMEX ) 1 MG tablet Take 3 mg by mouth daily.   Yes [provider]  losartan  (COZAAR ) 25 MG tablet Take 1 tablet (25 mg total) by mouth daily. 09/09/24 10/09/24 Yes Maree Hue, MD  spironolactone  (ALDACTONE ) 25 MG tablet Take 1 tablet (25 mg total) by mouth every morning. 09/09/24 10/09/24 Yes Maree Hue, MD     Critical care time: 65 minutes     Kiyona Mcnall, PA-C [facility] PCCM Team Contact Info: [phone]          [1]  Allergies Allergen Reactions   Erythromycin Anaphylaxis and Swelling    Had eye swelling with erythromycin during a time when he had perf ear drum.  Tolerated azithromycin .   "

## 2024-09-22 NOTE — ED Provider Notes (Signed)
 After discussion between hospitalist and ICU team and multiple blood gases despite the patient being alert oriented well-appearing I suspect he may need further adjustment in treatment of his COPD as his pCO2 does seem to be slowly trending upward.  ICU team will admit him  ([facility] accepting admit)   Dicky Anes, MD 09/22/24 [phone]

## 2024-09-22 NOTE — Progress Notes (Signed)
 RN made Dr. Isadora aware that patient is refusing bipap and asking for food. MD acknowledged and placing diet order. Patient on 3L nasal canula. Alert&Ox4 with no respiratory distress.

## 2024-09-22 NOTE — Progress Notes (Signed)
 eLink Physician-Brief Progress Note Patient Name: Estevon Fluke DOB: [birth year] MRN: [id number]   Date of Service  09/22/2024  HPI/Events of Note  69 y.o. male PMH multiple medical comorbidities including CHF, COPD, chronic hypoxic respiratory failure on 3 L, hypertension, peripheral neuropathy now in ICU for  Acute on chronic respiratory failure from  From COPD exacerbation. Multifocal broncho pneumonia,   Data: ECG: no acute ST T changes CxR image reviewed CTA no PE ABG: improving pco2 on BiPAP  Camera: Obese, awake and alert , VS stable. Tolerating BiPAP well   eICU Interventions  CBG goals < 180. Ssi/long acting Aspiration precautions Agree with current care plan. Trend troponin Nebs On ceftriaxone , doxy, steroids, PPI.  VTE sq heparin       Intervention Category Major Interventions: Respiratory failure - evaluation and management Evaluation Type: New Patient Evaluation  Jodelle ONEIDA Hutching 09/22/2024, 6:42 AM

## 2024-09-22 NOTE — TOC Initial Note (Addendum)
 Transition of Care [facility]) - Initial/Assessment Note    Patient Details  Name: Cody Alexander MRN: [id number] Date of Birth: Feb 21, 1956  [facility] CM/SW Contact:    Corrie JINNY Ruts, LCSW Phone Number: 09/22/2024, 2:39 PM  Clinical Narrative:                 Chart reviewed. Please note that the patient was in [facility] 2 weeks ago. SW was able to speak with the patient via telephone. Per chart review the patient PCP is [facility]. The patient reports that he lives in the home with a 93 year old lady and her daughter. The patient reports that needed a little assistance. The patient reports that he has no rides to his medical appointments. The patient reports that he can get transportation though his insurance but it has to be 3 days a head.   SW encouraged the patient to utilized insurance for support. SW also offered to put public transportation on his D/C summary. The patient declined and said it wouldn't work because he can barely walk.   [facility] SW inquired about shelter or DSS resources. The patient declined both offers. The patient reports that he can not go into a shelter due to him being on continues O2. The patient reports that he does not want DSS involvement. The patient reports that he has no where to go.   The patient reports the he does not know who will assist him ar D/C. The patient reports that he has a cane and walker. The patient reports that he is on 3-4 L at baseline.   SW inquired about need medication assistance. The patient consented to sending information to meds to bed.   Per [employer] search patient is active with [facility].   SW will continue to follow the patient until D/C.     Barriers to Discharge: Continued Medical Work up   Patient Goals and CMS Choice            Expected Discharge Plan and Services                                   HH Arranged: RN, PT, OT, Social Work [facility] Agency:  [employer] [facility]        Prior Living Arrangements/Services   Lives with:: Roommate          Need for Family Participation in Patient Care: Yes (Comment)     Criminal Activity/Legal Involvement Pertinent to Current Situation/Hospitalization: No - Comment as needed  Activities of Daily Living   ADL Screening (condition at time of admission) Independently performs ADLs?: Yes (appropriate for developmental age) Is the patient deaf or have difficulty hearing?: No Does the patient have difficulty seeing, even when wearing glasses/contacts?: No Does the patient have difficulty concentrating, remembering, or making decisions?: No  Permission Sought/Granted                  Emotional Assessment       Orientation: : Oriented to Place, Oriented to Self, Oriented to Situation, Oriented to  Time Alcohol / Substance Use: Not Applicable Psych Involvement: No (comment)  Admission diagnosis:  COPD exacerbation (HCC) [J44.1] Influenza [J11.1] Acute on chronic respiratory failure with hypoxia and hypercapnia (HCC) [J96.21, J96.22] Multifocal pneumonia [J18.8] Acute on chronic hypoxic respiratory failure (HCC) [J96.21] Patient Active Problem List   Diagnosis Date  Noted   Acute on chronic hypoxic respiratory failure (HCC) 09/22/2024   Pneumonia due to infectious organism 09/09/2024   Shortness of breath 09/09/2024   Left ankle pain 09/02/2024   Syncope 09/02/2024   Chest pain 12/19/2023   Chronic heart failure with preserved ejection fraction (HFpEF) (HCC) 12/19/2023   Frequent falls 12/19/2023   Dizziness 12/19/2023   Hematuria 09/28/2023   Hypertensive emergency 07/23/2023   CHF exacerbation (HCC) 07/23/2023   Respiratory failure (HCC) 06/12/2023   Goals of care, counseling/discussion 01/16/2023   History of kidney stones 01/06/2023   Hypokalemia 01/05/2023   Bilateral leg pain 11/10/2022   Chest pain    Syncope and collapse    Chronic back pain    Acute on  chronic respiratory failure with hypoxia and hypercapnia (HCC) 03/14/2022   Diabetes mellitus without complication (HCC)    Acute exacerbation of chronic low back pain    Urinary tract infection    HLD (hyperlipidemia) 01/27/2022   CAD with history of stent angioplasty 01/27/2022   Fall at home, initial encounter 01/27/2022   Leg weakness, bilateral 01/27/2022   Hyperkalemia    Benign prostatic hyperplasia with urinary hesitancy    Lower abdominal pain    Elevated troponin    Maculopapular rash, generalized    Uncontrolled type 2 diabetes mellitus with hyperglycemia (HCC) 01/04/2021   Acute on chronic diastolic CHF (congestive heart failure) (HCC) 01/04/2021   CHF (congestive heart failure), NYHA class I, acute, diastolic (HCC) 01/04/2021   OSA (obstructive sleep apnea) 01/04/2021   Obesity (BMI 30-39.9) 01/04/2021   Chronic respiratory failure with hypoxia (HCC) 01/04/2021   Acute on chronic congestive heart failure (HCC)    Acute on chronic respiratory failure with hypoxia (HCC) 06/20/2020   NSTEMI (non-ST elevated myocardial infarction) (HCC) 02/25/2018   Obesity, Class III, BMI 40-49.9 (morbid obesity) (HCC) 01/19/2017   COPD (chronic obstructive pulmonary disease) (HCC) 05/13/2014   Uncontrolled type 2 diabetes mellitus with hyperglycemia, with long-term current use of insulin  (HCC) 05/13/2014   Essential hypertension 05/13/2014   PCP:  [employer] Pharmacy:   [id number] [address], [place] - [address] [place] [address] Phone: [phone] Fax: [phone]  [facility] [address] [place], [place] - [address] [place] [address] Phone: [phone] Fax: [phone]  [facility] [address] [place] [address] Phone: [phone] Fax: [phone]     Social Drivers of Health (SDOH) Social History: SDOH Screenings   Food Insecurity: No Food  Insecurity (09/22/2024)  Housing: High Risk (09/22/2024)  Transportation Needs: No Transportation Needs (09/22/2024)  Utilities: Not At Risk (09/22/2024)  Alcohol Screen: Low Risk (07/23/2023)  Financial Resource Strain: Low Risk (07/23/2023)  Physical Activity: Inactive (09/11/2023)   Received from [facility]  Social Connections: Moderately Isolated (09/22/2024)  Stress: Stress Concern Present (09/11/2023)   Received from [facility]  Tobacco Use: Medium Risk (09/21/2024)  Health Literacy: Medium Risk (09/11/2023)   Received from [facility]   SDOH Interventions:     Readmission Risk Interventions    09/07/2024    1:28 PM 09/29/2023    1:04 PM 07/23/2023    1:58 PM  Readmission Risk Prevention Plan  Transportation Screening Complete Complete Complete  PCP or Specialist Appt within 3-5 Days Complete Complete Complete  HRI or Home Care Consult Complete Complete Complete  Social Work Consult for Recovery Care Planning/Counseling  Complete  Complete  Palliative Care Screening Not Applicable Not Applicable Not Applicable  Medication Review (RN Care Manager) Complete Complete Complete

## 2024-09-23 LAB — CBC
HCT: 38.2 % — ABNORMAL LOW (ref 39.0–52.0)
Hemoglobin: 11.9 g/dL — ABNORMAL LOW (ref 13.0–17.0)
MCH: 29.5 pg (ref 26.0–34.0)
MCHC: 31.2 g/dL (ref 30.0–36.0)
MCV: 94.6 fL (ref 80.0–100.0)
Platelets: 201 10*3/uL (ref 150–400)
RBC: 4.04 MIL/uL — ABNORMAL LOW (ref 4.22–5.81)
RDW: 12.5 % (ref 11.5–15.5)
WBC: 8.9 10*3/uL (ref 4.0–10.5)
nRBC: 0 % (ref 0.0–0.2)

## 2024-09-23 LAB — BLOOD GAS, VENOUS
Acid-Base Excess: 19.5 mmol/L — ABNORMAL HIGH (ref 0.0–2.0)
Bicarbonate: 50.2 mmol/L — ABNORMAL HIGH (ref 20.0–28.0)
Delivery systems: POSITIVE
O2 Saturation: 56.2 %
Patient temperature: 37
pCO2, Ven: 91 mmHg (ref 44–60)
pH, Ven: 7.35 (ref 7.25–7.43)
pO2, Ven: 34 mmHg (ref 32–45)

## 2024-09-23 LAB — GLUCOSE, CAPILLARY
Glucose-Capillary: 112 mg/dL — ABNORMAL HIGH (ref 70–99)
Glucose-Capillary: 142 mg/dL — ABNORMAL HIGH (ref 70–99)
Glucose-Capillary: 166 mg/dL — ABNORMAL HIGH (ref 70–99)
Glucose-Capillary: 192 mg/dL — ABNORMAL HIGH (ref 70–99)
Glucose-Capillary: 245 mg/dL — ABNORMAL HIGH (ref 70–99)
Glucose-Capillary: 250 mg/dL — ABNORMAL HIGH (ref 70–99)
Glucose-Capillary: 332 mg/dL — ABNORMAL HIGH (ref 70–99)

## 2024-09-23 LAB — RENAL FUNCTION PANEL
Albumin: 3.6 g/dL (ref 3.5–5.0)
Anion gap: 8 (ref 5–15)
BUN: 22 mg/dL (ref 8–23)
CO2: 40 mmol/L — ABNORMAL HIGH (ref 22–32)
Calcium: 8.9 mg/dL (ref 8.9–10.3)
Chloride: 92 mmol/L — ABNORMAL LOW (ref 98–111)
Creatinine, Ser: 1.05 mg/dL (ref 0.61–1.24)
GFR, Estimated: >60 mL/min
Glucose, Bld: 147 mg/dL — ABNORMAL HIGH (ref 70–99)
Phosphorus: 2.4 mg/dL — ABNORMAL LOW (ref 2.5–4.6)
Potassium: 4.1 mmol/L (ref 3.5–5.1)
Sodium: 140 mmol/L (ref 135–145)

## 2024-09-23 LAB — MAGNESIUM: Magnesium: 2 mg/dL (ref 1.7–2.4)

## 2024-09-23 LAB — PROCALCITONIN: Procalcitonin: 0.13 ng/mL

## 2024-09-23 MED ORDER — ACETAMINOPHEN 325 MG PO TABS
975.0000 mg | ORAL_TABLET | Freq: Four times a day (QID) | ORAL | Status: DC | PRN
  Administered 2024-09-23 – 2024-09-24 (×3): 975 mg via ORAL
  Filled 2024-09-23 (×3): qty 3

## 2024-09-23 MED ORDER — IPRATROPIUM-ALBUTEROL 0.5-2.5 (3) MG/3ML IN SOLN
3.0000 mL | Freq: Three times a day (TID) | RESPIRATORY_TRACT | Status: DC
  Administered 2024-09-23 – 2024-09-25 (×7): 3 mL via RESPIRATORY_TRACT
  Filled 2024-09-23 (×7): qty 3

## 2024-09-23 MED ORDER — ATORVASTATIN CALCIUM 80 MG PO TABS
80.0000 mg | ORAL_TABLET | Freq: Every day | ORAL | Status: DC
  Administered 2024-09-23 – 2024-09-26 (×4): 80 mg via ORAL
  Filled 2024-09-23 (×4): qty 1

## 2024-09-23 MED ORDER — PREDNISONE 20 MG PO TABS
40.0000 mg | ORAL_TABLET | Freq: Every day | ORAL | Status: DC
  Administered 2024-09-23 – 2024-09-26 (×4): 40 mg via ORAL
  Filled 2024-09-23: qty 2
  Filled 2024-09-23: qty 4
  Filled 2024-09-23 (×2): qty 2

## 2024-09-23 MED ORDER — ASPIRIN 81 MG PO CHEW
81.0000 mg | CHEWABLE_TABLET | Freq: Every day | ORAL | Status: DC
  Administered 2024-09-23 – 2024-09-26 (×4): 81 mg via ORAL
  Filled 2024-09-23 (×4): qty 1

## 2024-09-23 MED ORDER — SPIRONOLACTONE 25 MG PO TABS
25.0000 mg | ORAL_TABLET | Freq: Every morning | ORAL | Status: DC
  Administered 2024-09-23 – 2024-09-26 (×4): 25 mg via ORAL
  Filled 2024-09-23 (×4): qty 1

## 2024-09-23 NOTE — Hospital Course (Addendum)
 69 year old male with a past medical history significant for hypertension, HFpEF (EF 60-65%), COPD on 4L O2 at baseline and Type II Diabetes Mellitus who presented to [facility] ED via EMS with concern of shortness of breath. Found to be in hypoxic/hypercapnic respiratory failure 2/2 Flu infection and CHF exacerbation, requiring Bipap for significant CO2 retention. He was placed on IV lasix , tamiflu  and Bipap with improvement in his symptoms.

## 2024-09-23 NOTE — Plan of Care (Signed)
" °  Problem: Coping: Goal: Ability to adjust to condition or change in health will improve Outcome: Progressing   Problem: Fluid Volume: Goal: Ability to maintain a balanced intake and output will improve Outcome: Progressing   Problem: Metabolic: Goal: Ability to maintain appropriate glucose levels will improve Outcome: Progressing   Problem: Skin Integrity: Goal: Risk for impaired skin integrity will decrease Outcome: Progressing   Problem: Clinical Measurements: Goal: Diagnostic test results will improve Outcome: Progressing   Problem: Elimination: Goal: Will not experience complications related to urinary retention Outcome: Progressing   "

## 2024-09-23 NOTE — Plan of Care (Signed)
" °  Problem: Coping: Goal: Ability to adjust to condition or change in health will improve Outcome: Progressing   Problem: Fluid Volume: Goal: Ability to maintain a balanced intake and output will improve Outcome: Progressing   Problem: Education: Goal: Knowledge of General Education information will improve Description: Including pain rating scale, medication(s)/side effects and non-pharmacologic comfort measures Outcome: Progressing   Problem: Clinical Measurements: Goal: Respiratory complications will improve Outcome: Progressing   Problem: Pain Managment: Goal: General experience of comfort will improve and/or be controlled Outcome: Progressing   "

## 2024-09-23 NOTE — Progress Notes (Signed)
" °  Progress Note   Patient: Cody Alexander FMW:[id number] DOB: December 06, 1955 DOA: 09/21/2024     1 DOS: the patient was seen and examined on 09/23/2024   Brief hospital course: 69 year old male with a past medical history significant for hypertension, HFpEF (EF 60-65%), COPD on 3L O2 at baseline and Type II Diabetes Mellitus who presented to [facility] ED via EMS with concern of shortness of breath. Found to be in hypoxic/hypercapnic respiratory failure 2/2 Flu infection and CHF exacerbation, requiring Bipap for significant CO2 retention.   Assessment and Plan:  Acute on chronic hypoxic/hypercapnic respiratory failure likely 2/2 Flu infection/CHF/COPD exacerbation Influenza B infection  Multifocal bronchopneumonia, likely 2.2 flu infection, vs superadded bacterial infection.  Chronic COPD with acute exacerbation  OSA on CPAP Procalcitonin-0.12, WBC-6.5, PH-7.35, PC02- 89-90 CT brain- no acute process CTA chest- No PE, Multifocal bronchopneumonia. Repeat Chest in 3 months.  Continue Tamiflu , IV rocephin  and azithromycin , Add prednisone , Treat underlying etiologies Wean off oxygen to baseline, Wean off Bipap as tolerated.  Will need Bipap at discharge, Obtain pulmonary evaluation.    Acute on chronic diastolic heart failure CAD s/p PCI Hypertension BNP-1764, Troponins-64 CXR- vascular congestion/atelectasis  Continue IV lasix , Resume aspirin , statin, aldactone .  If BP stable resume losartan  in am, Monitor Io's, Daily weights Monitor renal functions   Type 2 DM  Obesity, BMI-37.93 Anticipate hyperglycemia with steroids Monitor accu checks, ISS    Chronic left ankle pain 2/2 prior Left achilles tendon injury/partial tear.  General deconditioning  Prn analgesia, PT eval.      Subjective:   Reports DIB, wheeze and coughing. Coughing up tinges of blood C/o Chest discomfort with cough. No fever or chills   Physical Exam: Vitals:   09/23/24 0500 09/23/24 0501 09/23/24 0600 09/23/24 0800   BP: 122/68 122/68 126/78 (!) 143/91  Pulse: 72 71 62 67  Resp: 16 17 16 20   Temp:    98 F (36.7 C)  TempSrc:    Oral  SpO2: 100% 100% 97% 95%  Weight: 134.3 kg     Height:       General: Adult male, acutely ill, lying in bed, on [place] via nasal cannula  CV: RRR, S1 S2, NSR on monitor, no r/m/g Pulm: Regular, expiratory wheezing and scattered coarse breath sounds throughout GI: soft, non-tender, non-distended, no rebound/guarding, bowel sounds x 4 Extremities: 1+ edema noted Neuro: A&O x 4, no focal neuro deficits   Data Reviewed:  There are no new results to review at this time.  Family Communication: Patient declined.   Disposition: Status is: Inpatient Remains inpatient appropriate because: of ongoing treatments as above  Planned Discharge Destination: Skilled nursing facility    Time spent: 55 minutes  Author: Genell JONELLE Overcast, MD 09/23/2024 9:04 AM  For on call review [email or web address]  "

## 2024-09-24 LAB — GLUCOSE, CAPILLARY
Glucose-Capillary: 131 mg/dL — ABNORMAL HIGH (ref 70–99)
Glucose-Capillary: 124 mg/dL — ABNORMAL HIGH (ref 70–99)
Glucose-Capillary: 225 mg/dL — ABNORMAL HIGH (ref 70–99)
Glucose-Capillary: 258 mg/dL — ABNORMAL HIGH (ref 70–99)
Glucose-Capillary: 268 mg/dL — ABNORMAL HIGH (ref 70–99)

## 2024-09-24 LAB — RENAL FUNCTION PANEL
Albumin: 3.3 g/dL — ABNORMAL LOW (ref 3.5–5.0)
Anion gap: 5 (ref 5–15)
BUN: 25 mg/dL — ABNORMAL HIGH (ref 8–23)
CO2: 41 mmol/L — ABNORMAL HIGH (ref 22–32)
Calcium: 8.8 mg/dL — ABNORMAL LOW (ref 8.9–10.3)
Chloride: 89 mmol/L — ABNORMAL LOW (ref 98–111)
Creatinine, Ser: 0.96 mg/dL (ref 0.61–1.24)
GFR, Estimated: >60 mL/min
Glucose, Bld: 138 mg/dL — ABNORMAL HIGH (ref 70–99)
Phosphorus: 2.1 mg/dL — ABNORMAL LOW (ref 2.5–4.6)
Potassium: 3.8 mmol/L (ref 3.5–5.1)
Sodium: 135 mmol/L (ref 135–145)

## 2024-09-24 LAB — CBC
HCT: 36.7 % — ABNORMAL LOW (ref 39.0–52.0)
Hemoglobin: 11.4 g/dL — ABNORMAL LOW (ref 13.0–17.0)
MCH: 28.9 pg (ref 26.0–34.0)
MCHC: 31.1 g/dL (ref 30.0–36.0)
MCV: 93.1 fL (ref 80.0–100.0)
Platelets: 183 10*3/uL (ref 150–400)
RBC: 3.94 MIL/uL — ABNORMAL LOW (ref 4.22–5.81)
RDW: 12.2 % (ref 11.5–15.5)
WBC: 7.9 10*3/uL (ref 4.0–10.5)
nRBC: 0 % (ref 0.0–0.2)

## 2024-09-24 MED ORDER — ENOXAPARIN SODIUM 40 MG/0.4ML IJ SOSY: 40.0000 mg | PREFILLED_SYRINGE | INTRAMUSCULAR | Status: DC

## 2024-09-24 MED ORDER — K PHOS MONO-SOD PHOS DI & MONO 155-852-130 MG PO TABS
500.0000 mg | ORAL_TABLET | Freq: Two times a day (BID) | ORAL | Status: AC
  Administered 2024-09-24 (×2): 500 mg via ORAL
  Filled 2024-09-24 (×3): qty 2

## 2024-09-24 MED ORDER — DIPHENHYDRAMINE-ZINC ACETATE 2-0.1 % EX CREA
1.0000 | TOPICAL_CREAM | Freq: Every day | CUTANEOUS | Status: DC | PRN
  Administered 2024-09-24: 1 via TOPICAL
  Filled 2024-09-24: qty 28

## 2024-09-24 MED ORDER — OXYCODONE HCL 5 MG PO TABS
5.0000 mg | ORAL_TABLET | ORAL | Status: DC | PRN
  Administered 2024-09-24 – 2024-09-26 (×7): 5 mg via ORAL
  Filled 2024-09-24 (×7): qty 1

## 2024-09-24 MED ORDER — LOSARTAN POTASSIUM 25 MG PO TABS
25.0000 mg | ORAL_TABLET | Freq: Every day | ORAL | Status: DC
  Administered 2024-09-24 – 2024-09-26 (×3): 25 mg via ORAL
  Filled 2024-09-24 (×3): qty 1

## 2024-09-24 MED ORDER — ENOXAPARIN SODIUM 80 MG/0.8ML IJ SOSY
0.5000 mg/kg | PREFILLED_SYRINGE | INTRAMUSCULAR | Status: DC
  Administered 2024-09-24: 65 mg via SUBCUTANEOUS
  Filled 2024-09-24 (×2): qty 0.8

## 2024-09-24 NOTE — Progress Notes (Signed)
" °  Progress Note   Patient: Cody Alexander FMW:[id number] DOB: 30-Dec-1955 DOA: 09/21/2024     2 DOS: the patient was seen and examined on 09/24/2024   Brief hospital course: 69 year old male with a past medical history significant for hypertension, HFpEF (EF 60-65%), COPD on 4L O2 at baseline and Type II Diabetes Mellitus who presented to [facility] ED via EMS with concern of shortness of breath. Found to be in hypoxic/hypercapnic respiratory failure 2/2 Flu infection and CHF exacerbation, requiring Bipap for significant CO2 retention. He was placed on IV lasix , tamiflu  and Bipap with improvement in his symptoms.   Assessment and Plan:  Acute on chronic hypoxic/hypercapnic respiratory failure likely 2/2 Flu infection/CHF/COPD exacerbation Influenza B infection  Multifocal bronchopneumonia, likely 2.2 flu infection, vs superadded bacterial infection.  Chronic COPD with acute exacerbation  OSA on CPAP Procalcitonin-0.12, WBC-6.5, PH-7.35, PC02- 89-90 CT brain- no acute process. MRSA negative  CTA chest- No PE, Multifocal bronchopneumonia. Repeat Chest in 3 months.  Continue Tamiflu , IV rocephin  and azithromycin , prednisone , Treat underlying etiologies On 6L, Wean off oxygen to baseline, Continue BIPAP while asleep and during nap times  Will need Bipap at discharge, Await pulmonary evaluation.  Check VBG  Patient counseled regarding BIPAP compliance. Ok to transfer to PCU   Acute on chronic diastolic heart failure CAD s/p PCI Hypertension BNP-1764, Troponins-64 CXR- vascular congestion/atelectasis  Continue IV lasix  40 mg bid, aspirin , statin, aldactone . Resume losartan  Monitor Io's, Daily weights, Monitor renal functions   Type 2 DM  Obesity, BMI-37.93 Anticipate hyperglycemia with steroids Monitor accu checks, ISS    Chronic left ankle pain 2/2 prior Left achilles tendon injury/partial tear.  General deconditioning  Prn analgesia, PT eval.      Subjective:   Reports breathing  better but still coughing a lot, bringing up brown phlegm Reports pain with cough and his Leg On BIPAP for few hours last night.   Physical Exam: Vitals:   09/24/24 0549 09/24/24 0600 09/24/24 0700 09/24/24 0800  BP: (!) 174/99 (!) 138/91 (!) 153/96 (!) 157/91  Pulse:  89 80 73  Resp:  (!) 34 17 18  Temp:    97.7 F (36.5 C)  TempSrc:    Axillary  SpO2:  100% 95% 95%  Weight:      Height:       General: Adult male, acutely ill, lying in bed, on [place] via nasal cannula  CV: RRR, S1 S2, NSR on monitor, no r/m/g Pulm: Regular, expiratory wheezing and scattered coarse breath sounds throughout GI: soft, non-tender, non-distended, no rebound/guarding, bowel sounds x 4 Extremities: 1+ edema noted Neuro: A&O x 4, no focal neuro deficits   Data Reviewed:  There are no new results to review at this time.  Family Communication: Patient declined.   Disposition: Status is: Inpatient Remains inpatient appropriate because: of ongoing treatments as above  Planned Discharge Destination: Skilled nursing facility    Time spent: 55 minutes  Author: Genell JONELLE Overcast, MD 09/24/2024 9:11 AM  For on call review [email or web address]  "

## 2024-09-24 NOTE — Plan of Care (Signed)
  Problem: Fluid Volume: Goal: Ability to maintain a balanced intake and output will improve Outcome: Progressing   Problem: Nutritional: Goal: Progress toward achieving an optimal weight will improve Outcome: Progressing   Problem: Tissue Perfusion: Goal: Adequacy of tissue perfusion will improve Outcome: Progressing

## 2024-09-24 NOTE — Plan of Care (Signed)
   Problem: Clinical Measurements: Goal: Ability to maintain clinical measurements within normal limits will improve Outcome: Progressing Goal: Respiratory complications will improve Outcome: Progressing Goal: Cardiovascular complication will be avoided Outcome: Progressing

## 2024-09-24 NOTE — Inpatient Diabetes Management (Signed)
 Inpatient Diabetes Program Recommendations  AACE/ADA: New Consensus Statement on Inpatient Glycemic Control (2015)  Target Ranges:  Prepandial:   less than 140 mg/dL      Peak postprandial:   less than 180 mg/dL (1-2 hours)      Critically ill patients:  140 - 180 mg/dL    Latest Reference Range & Units 09/22/24 23:05 09/23/24 03:55 09/23/24 07:42 09/23/24 12:02 09/23/24 15:23 09/23/24 16:50 09/23/24 19:40  Glucose-Capillary 70 - 99 mg/dL 821 (H) 857 (H) 887 (H) 166 (H) 245 (H) 250 (H) 332 (H)  (H): Data is abnormally high  Latest Reference Range & Units 09/23/24 23:36 09/24/24 03:30 09/24/24 07:49 09/24/24 11:19  Glucose-Capillary 70 - 99 mg/dL 807 (H) 868 (H) 875 (H) 225 (H)  (H): Data is abnormally high     Home DM Meds: None Listed  Current Orders: Novolog  Moderate Correction Scale/ SSI (0-15 units) Q4H     Semglee  10 units daily    MD- Note pt getting Prednisone  40 mg daily  Having elevated afternoon CBGs likely from the Prednisone   May consider starting Novolog  Meal Coverage: Novolog  4 units TID with meals HOLD if pt NPO HOLD if pt eats <50% meals    --Will follow patient during hospitalization--  Adina Rudolpho Arrow RN, MSN, CDCES Diabetes Coordinator Inpatient Glycemic Control Team Team Pager: [phone] (8a-5p)

## 2024-09-24 NOTE — Progress Notes (Signed)
 PHARMACIST - PHYSICIAN COMMUNICATION  CONCERNING:  Enoxaparin  (Lovenox ) for DVT Prophylaxis    RECOMMENDATION: Patient was prescribed enoxaprin 40mg  q24 hours for VTE prophylaxis.   Filed Weights   09/22/24 0639 09/23/24 0500 09/24/24 0500  Weight: 132 kg (291 lb 0.1 oz) 134.3 kg (296 lb 1.2 oz) 130.9 kg (288 lb 9.3 oz)    Body mass index is 38.07 kg/m.  Estimated Creatinine Clearance: 105.9 mL/min (by C-G formula based on SCr of 0.96 mg/dL).   Based on [facility] policy patient is candidate for enoxaparin  0.5mg /kg TBW SQ every 24 hours based on BMI being >30.   DESCRIPTION: Pharmacy has adjusted enoxaparin  dose per [facility] policy.  Patient is now receiving enoxaparin  65 mg every 24 hours    Estill CHRISTELLA Lutes, PharmD, BCPS Clinical Pharmacist 09/24/2024 1:56 PM

## 2024-09-25 DIAGNOSIS — J9621 Acute and chronic respiratory failure with hypoxia: Secondary | ICD-10-CM | POA: Diagnosis not present

## 2024-09-25 LAB — GLUCOSE, CAPILLARY
Glucose-Capillary: 149 mg/dL — ABNORMAL HIGH (ref 70–99)
Glucose-Capillary: 177 mg/dL — ABNORMAL HIGH (ref 70–99)
Glucose-Capillary: 186 mg/dL — ABNORMAL HIGH (ref 70–99)
Glucose-Capillary: 207 mg/dL — ABNORMAL HIGH (ref 70–99)
Glucose-Capillary: 234 mg/dL — ABNORMAL HIGH (ref 70–99)
Glucose-Capillary: 330 mg/dL — ABNORMAL HIGH (ref 70–99)
Glucose-Capillary: 349 mg/dL — ABNORMAL HIGH (ref 70–99)

## 2024-09-25 LAB — CBC
HCT: 38.2 % — ABNORMAL LOW (ref 39.0–52.0)
Hemoglobin: 12.2 g/dL — ABNORMAL LOW (ref 13.0–17.0)
MCH: 29.3 pg (ref 26.0–34.0)
MCHC: 31.9 g/dL (ref 30.0–36.0)
MCV: 91.6 fL (ref 80.0–100.0)
Platelets: 190 10*3/uL (ref 150–400)
RBC: 4.17 MIL/uL — ABNORMAL LOW (ref 4.22–5.81)
RDW: 12.2 % (ref 11.5–15.5)
WBC: 7.4 10*3/uL (ref 4.0–10.5)
nRBC: 0 % (ref 0.0–0.2)

## 2024-09-25 LAB — BLOOD GAS, VENOUS

## 2024-09-25 LAB — RENAL FUNCTION PANEL
Albumin: 3.5 g/dL (ref 3.5–5.0)
Anion gap: 7 (ref 5–15)
BUN: 27 mg/dL — ABNORMAL HIGH (ref 8–23)
CO2: 43 mmol/L — ABNORMAL HIGH (ref 22–32)
Calcium: 8.8 mg/dL — ABNORMAL LOW (ref 8.9–10.3)
Chloride: 89 mmol/L — ABNORMAL LOW (ref 98–111)
Creatinine, Ser: 1.07 mg/dL (ref 0.61–1.24)
GFR, Estimated: >60 mL/min
Glucose, Bld: 153 mg/dL — ABNORMAL HIGH (ref 70–99)
Phosphorus: 3.3 mg/dL (ref 2.5–4.6)
Potassium: 3.4 mmol/L — ABNORMAL LOW (ref 3.5–5.1)
Sodium: 140 mmol/L (ref 135–145)

## 2024-09-25 LAB — MAGNESIUM: Magnesium: 2.1 mg/dL (ref 1.7–2.4)

## 2024-09-25 MED ORDER — INSULIN ASPART 100 UNIT/ML IJ SOLN
4.0000 [IU] | Freq: Three times a day (TID) | INTRAMUSCULAR | Status: DC
  Administered 2024-09-26 (×2): 4 [IU] via SUBCUTANEOUS
  Filled 2024-09-25 (×2): qty 4

## 2024-09-25 MED ORDER — GUAIFENESIN ER 600 MG PO TB12
600.0000 mg | ORAL_TABLET | Freq: Two times a day (BID) | ORAL | Status: DC
  Administered 2024-09-25 – 2024-09-26 (×3): 600 mg via ORAL
  Filled 2024-09-25 (×3): qty 1

## 2024-09-25 MED ORDER — GUAIFENESIN-DM 100-10 MG/5ML PO SYRP: 5.0000 mL | ORAL_SOLUTION | ORAL | Status: DC | PRN

## 2024-09-25 MED ORDER — POTASSIUM CHLORIDE CRYS ER 20 MEQ PO TBCR
40.0000 meq | EXTENDED_RELEASE_TABLET | Freq: Once | ORAL | Status: AC
  Administered 2024-09-25: 40 meq via ORAL
  Filled 2024-09-25: qty 2

## 2024-09-25 NOTE — Progress Notes (Signed)
 Notified by RN that VBG was critical. RN reports patient has only been on BIPAP from 0000-0400. RT placed patient back on bipap at this time  Latest Reference Range & Units Most Recent  pH, Ven 7.25 - 7.43  7.38 (C) 09/25/24 04:19  pCO2, Ven 44 - 60 mmHg 88 (HH) (C) 09/25/24 04:19  pO2, Ven 32 - 45 mmHg PENDING 09/25/24 04:19  Acid-Base Excess 0.0 - 2.0 mmol/L 21.7 (H) (C) 09/25/24 04:19  Bicarbonate 20.0 - 28.0 mmol/L 52.1 (H) (C) 09/25/24 04:19  O2 Saturation % 43.8 (C) 09/25/24 04:19  Patient temperature  37.0 (C) 09/25/24 04:19  Collection site  VEIN (C) 09/25/24 04:19  Allens test (pass/fail) PASS  PASS 09/06/24 06:00  (HH): Data is critically high (H): Data is abnormally high (C): Corrected

## 2024-09-25 NOTE — Evaluation (Addendum)
 Physical Therapy Evaluation Patient Details Name: Cody Alexander MRN: [id number] DOB: 19-Feb-1956 Today's Date: 09/25/2024  History of Present Illness  69 year old male with a past medical history significant for hypertension, HFpEF (EF 60-65%), COPD on 4L O2 at baseline and Type II Diabetes Mellitus who presented to [facility] ED via EMS with concern of shortness of breath. Found to be in hypoxic/hypercapnic respiratory failure 2/2 Flu infection and CHF exacerbation, requiring Bipap for significant CO2 retention  Clinical Impression  Pt pleasant and interested in seeing how he would tolerate a longer bout of ambulation.  He reports he feels he is breathing much better than on arrival, but still not at baseline.  5L O2 on arrival with SpO2 in the high 90s, ambulated ~200 ft with FWW on 6L with SpO2 staying in the high 90s and HR stable in the 80s pre and post ambulation.  Pt has ADs at home, ongoing L calf/achilles issues with no overt safety problems.  Will maintain pt on PT while admitted to insure safe and appropriate d/c.      If plan is discharge home, recommend the following: Assist for transportation   Can travel by private vehicle        Equipment Recommendations None recommended by PT  Recommendations for Other Services       Functional Status Assessment  (increased O2 requirement)     Precautions / Restrictions Precautions Precautions: Fall Recall of Precautions/Restrictions: Impaired Restrictions Weight Bearing Restrictions Per Provider Order: No      Mobility  Bed Mobility               General bed mobility comments: in recliner pre/post session    Transfers Overall transfer level: Needs assistance Equipment used: Rolling walker (2 wheels) Transfers: Sit to/from Stand Sit to Stand: Contact guard assist           General transfer comment: appropriate use of UEs - did not need direct assist to attain standing in walker     Ambulation/Gait Ambulation/Gait assistance: Contact guard assist Gait Distance (Feet): 200 Feet Assistive device: Rolling walker (2 wheels)         General Gait Details: on 6L O2, sSpO2 remains in the high 90s despite some fatigue and low grade DOE.  Pt needing cuing to insure appropriate positioning in RW and good posture but with no LOBs or overt safety issues.  Did endorse mild L achilles pain.  Stairs            Wheelchair Mobility     Tilt Bed    Modified Rankin (Stroke Patients Only)       Balance                                             Pertinent Vitals/Pain Pain Assessment Pain Assessment: 0-10 Pain Score: 4  Pain Location: L foot/calf Pain Intervention(s): Limited activity within patient's tolerance    Home Living Family/patient expects to be discharged to:: Private residence Living Arrangements: Non-relatives/Friends Available Help at Discharge: Available PRN/intermittently Type of Home: Mobile home Home Access: Stairs to enter Entrance Stairs-Rails: Left Entrance Stairs-Number of Steps: 4-5   Home Layout: One level Home Equipment: Agricultural Consultant (2 wheels);Cane - single point Additional Comments: concentrator, and designer, jewellery    Prior Function Prior Level of Function : Independent/Modified Independent;History of Falls (last six months);Needs assist  Mobility Comments: Pt reports limited walking due to recent L calf injury, more recently uses RW. Reports multiple falls. ADLs Comments: Pt reports increased difficulty with ADL and IADL 2/2 weakness and falls, light meal prep, does NOT drive so has been paying friend to take him places. Uses weekly pill box but does endorse forgetting to take his medications.     Extremity/Trunk Assessment   Upper Extremity Assessment Upper Extremity Assessment: Overall WFL for tasks assessed    Lower Extremity Assessment Lower Extremity Assessment: Overall WFL  for tasks assessed (L calf/achilles hesitancy - functional t/o)       Communication   Communication Communication: No apparent difficulties    Cognition Arousal: Alert Behavior During Therapy: WFL for tasks assessed/performed   PT - Cognitive impairments: No apparent impairments                         Following commands: Intact       Cueing Cueing Techniques: Verbal cues, Gestural cues, Tactile cues, Visual cues     General Comments General comments (skin integrity, edema, etc.): Pt on 5L on arrival with SpO2 in the high 90s, sustained high 90s with ambulation on 6L (5L unavailable on tank) mild fatigue/DOE but able to sustain appropriate effort    Exercises     Assessment/Plan    PT Assessment Patient needs continued PT services  PT Problem List Decreased strength;Decreased balance;Decreased activity tolerance;Decreased mobility;Decreased coordination;Decreased cognition;Decreased knowledge of use of DME;Decreased safety awareness;Decreased knowledge of precautions;Cardiopulmonary status limiting activity;Pain       PT Treatment Interventions DME instruction;Stair training;Gait training;Functional mobility training;Therapeutic activities;Therapeutic exercise;Balance training;Neuromuscular re-education;Patient/family education    PT Goals (Current goals can be found in the Care Plan section)  Acute Rehab PT Goals Patient Stated Goal: to go home PT Goal Formulation: With patient Time For Goal Achievement: 10/08/24 Potential to Achieve Goals: Good    Frequency Min 1X/week     Co-evaluation               AM-PAC PT 6 Clicks Mobility  Outcome Measure Help needed turning from your back to your side while in a flat bed without using bedrails?: None Help needed moving from lying on your back to sitting on the side of a flat bed without using bedrails?: None Help needed moving to and from a bed to a chair (including a wheelchair)?: A Little Help needed  standing up from a chair using your arms (e.g., wheelchair or bedside chair)?: A Little Help needed to walk in hospital room?: A Little Help needed climbing 3-5 steps with a railing? : A Little 6 Click Score: 20    End of Session Equipment Utilized During Treatment: Oxygen Activity Tolerance: Patient tolerated treatment well;Patient limited by fatigue Patient left: with call bell/phone within reach;in chair;with nursing/sitter in room Nurse Communication: Mobility status (O2) PT Visit Diagnosis: Unsteadiness on feet (R26.81);Muscle weakness (generalized) (M62.81);Other abnormalities of gait and mobility (R26.89);History of falling (Z91.81);Difficulty in walking, not elsewhere classified (R26.2)    Time: 1125-1150 PT Time Calculation (min) (ACUTE ONLY): 25 min   Charges:   PT Evaluation $PT Eval Low Complexity: 1 Low PT Treatments $Gait Training: 8-22 mins PT General Charges $$ ACUTE PT VISIT: 1 Visit         Carmin JONELLE Deed, DPT 09/25/2024, 1:12 PM

## 2024-09-25 NOTE — Progress Notes (Signed)
 CRITICAL VALUE STICKER  CRITICAL VALUE: VBG pco2 - 88   RECEIVER (on-site recipient of call): Brittany-charge nurse   DATE & TIME NOTIFIED: 09/25/2024 5:33 am  MESSENGER (representative from lab): MD NOTIFIED: Laneta Boston NP  TIME OF NOTIFICATION: 05:41  RESPONSE:  no new order

## 2024-09-25 NOTE — Progress Notes (Signed)
" °  Progress Note   Patient: Cody Alexander FMW:[id number] DOB: December 23, 1955 DOA: 09/21/2024     3 DOS: the patient was seen and examined on 09/25/2024   Brief hospital course: 69 year old male with a past medical history significant for hypertension, HFpEF (EF 60-65%), COPD on 4L O2 at baseline and Type II Diabetes Mellitus who presented to [facility] ED via EMS with concern of shortness of breath. Found to be in hypoxic/hypercapnic respiratory failure 2/2 Flu infection and CHF exacerbation, requiring Bipap for significant CO2 retention. He was placed on IV lasix , tamiflu  and Bipap with improvement in his symptoms.   Assessment and Plan:  Acute on chronic hypoxic/hypercapnic respiratory failure likely 2/2 Flu infection/CHF/COPD exacerbation Influenza B infection  Multifocal bronchopneumonia, likely 2.2 flu infection, vs superadded bacterial infection.  Chronic COPD with acute exacerbation  OSA on CPAP Procalcitonin-0.12, WBC-6.5, PH-7.35, PC02- 89-90 CT brain- no acute process. MRSA negative  CTA chest- No PE, Multifocal bronchopneumonia. Repeat Chest in 3 months.  Continue Tamiflu , IV rocephin  and azithromycin , prednisone , Treat underlying etiologies On 5L, Wean off oxygen to baseline 3-4L [place], Continue BIPAP while asleep and during nap times  Will need Bipap at discharge, Await pulmonary evaluation.  Check VBG  Patient counseled regarding BIPAP compliance. Ok to transfer to PCU  Mucinex , Robitussin  Acute on chronic diastolic heart failure CAD s/p PCI Hypertension BNP-1764, Troponins-64 CXR- vascular congestion/atelectasis  Continue IV lasix  40 mg bid, aspirin , statin, aldactone . Resume losartan  Monitor Io's, Daily weights, Monitor renal functions   Type 2 DM  Steroid-induced hyperglycemia Obesity, BMI-37.93 Lantus  10u, SSI, add NovoLog  4 units TID Monitor accu checks  Hypokalemia Monitor and replete as needed   Chronic left ankle pain 2/2 prior Left achilles tendon injury/partial  tear.  General deconditioning  Prn analgesia     Subjective:   Reports breathing better but still coughing a lot, bringing up brown phlegm Reports still feel congested, added Mucinex  and Robitussin Patient wanted to be on Tussionex, discussed we will try these first  Physical Exam: Vitals:   09/25/24 0459 09/25/24 0500 09/25/24 0800 09/25/24 0846  BP: (!) 148/96   137/77  Pulse: 61   79  Resp:      Temp:    97.7 F (36.5 C)  TempSrc:      SpO2:   94% 96%  Weight:  127.2 kg    Height:       General: Adult male, on [place] via nasal cannula  CV: RRR, S1 S2, NSR on monitor, no r/m/g Pulm: Regular, expiratory wheezing and scattered coarse breath sounds throughout GI: soft, non-tender, non-distended, no rebound/guarding, bowel sounds x 4 Extremities: 1+ edema noted Neuro: A&O x 4, no focal neuro deficits   Data Reviewed:  There are no new results to review at this time.  Family Communication: Patient declined.   Disposition: Status is: Inpatient Remains inpatient appropriate because: of ongoing treatments as above  Planned Discharge Destination: Skilled nursing facility    Time spent: 55 minutes  Author: Laree Lock, MD 09/25/2024 9:11 AM  For on call review [email or web address].  "

## 2024-09-26 ENCOUNTER — Other Ambulatory Visit: Payer: Self-pay

## 2024-09-26 DIAGNOSIS — J9621 Acute and chronic respiratory failure with hypoxia: Secondary | ICD-10-CM | POA: Diagnosis not present

## 2024-09-26 LAB — MAGNESIUM: Magnesium: 2.2 mg/dL (ref 1.7–2.4)

## 2024-09-26 LAB — BLOOD GAS, VENOUS
Bicarbonate: 52.1 mmol/L — ABNORMAL HIGH (ref 20.0–28.0)
O2 Saturation: 43.8 mmol/L — AB (ref 0.0–2.0)
Patient temperature: 37
Patient temperature: 43.8
pCO2, Ven: 88 mmHg (ref 44–60)
pH, Ven: 7.38 (ref 7.25–7.43)
pO2, Ven: 52.1 mmol/L — AB (ref 32–45)

## 2024-09-26 LAB — GLUCOSE, CAPILLARY
Glucose-Capillary: 124 mg/dL — ABNORMAL HIGH (ref 70–99)
Glucose-Capillary: 146 mg/dL — ABNORMAL HIGH (ref 70–99)
Glucose-Capillary: 216 mg/dL — ABNORMAL HIGH (ref 70–99)
Glucose-Capillary: 240 mg/dL — ABNORMAL HIGH (ref 70–99)

## 2024-09-26 LAB — BASIC METABOLIC PANEL WITH GFR
Anion gap: 6 (ref 5–15)
BUN: 30 mg/dL — ABNORMAL HIGH (ref 8–23)
CO2: 43 mmol/L — ABNORMAL HIGH (ref 22–32)
Calcium: 8.9 mg/dL (ref 8.9–10.3)
Chloride: 91 mmol/L — ABNORMAL LOW (ref 98–111)
Creatinine, Ser: 1.01 mg/dL (ref 0.61–1.24)
GFR, Estimated: >60 mL/min
Glucose, Bld: 127 mg/dL — ABNORMAL HIGH (ref 70–99)
Potassium: 3.8 mmol/L (ref 3.5–5.1)
Sodium: 139 mmol/L (ref 135–145)

## 2024-09-26 MED ORDER — DEXTROMETHORPHAN-GUAIFENESIN 20-200 MG/20ML PO LIQD
10.0000 mL | ORAL | 0 refills | Status: AC | PRN
  Filled 2024-09-26: qty 118, 6d supply, fill #0

## 2024-09-26 MED ORDER — ACETAMINOPHEN 325 MG PO TABS
650.0000 mg | ORAL_TABLET | Freq: Four times a day (QID) | ORAL | 1 refills | Status: AC | PRN
  Filled 2024-09-26: qty 45, 6d supply, fill #0

## 2024-09-26 MED ORDER — OXYCODONE HCL 5 MG PO TABS
5.0000 mg | ORAL_TABLET | ORAL | 0 refills | Status: AC | PRN
  Filled 2024-09-26: qty 10, 2d supply, fill #0

## 2024-09-26 MED ORDER — BUMETANIDE 1 MG PO TABS: 3.0000 mg | ORAL_TABLET | Freq: Every day | ORAL | Status: DC

## 2024-09-26 MED ORDER — OSELTAMIVIR PHOSPHATE 75 MG PO CAPS
75.0000 mg | ORAL_CAPSULE | Freq: Two times a day (BID) | ORAL | 0 refills | Status: AC
  Filled 2024-09-26: qty 1, 1d supply, fill #0

## 2024-09-26 MED ORDER — BUMETANIDE 1 MG PO TABS
3.0000 mg | ORAL_TABLET | Freq: Every day | ORAL | 1 refills | Status: AC
  Filled 2024-09-26: qty 3, 1d supply, fill #0
  Filled 2024-09-26: qty 87, 29d supply, fill #0

## 2024-09-26 MED ORDER — PREDNISONE 20 MG PO TABS
40.0000 mg | ORAL_TABLET | Freq: Every day | ORAL | 0 refills | Status: AC
  Filled 2024-09-26: qty 2, 1d supply, fill #0

## 2024-09-26 NOTE — Discharge Summary (Signed)
 " Physician Discharge Summary   Patient: Cody Alexander MRN: [id number] DOB: October 28, 1955  Admit date:     09/21/2024  Discharge date: 09/26/24  Discharge Physician: Laree Lock   PCP: [facility]   Recommendations at discharge:   Follow up with PCP within 1 week - Repeat BMP, CBC Repeat CT chest in 3 months  Follow up with Pulmonary outpatient Follow up with Cardiology outpatient   On chronic O2 3L [place]  Discharge Diagnoses: Principal Problem:   Acute on chronic hypoxic respiratory failure (HCC) Active Problems:   Acute on chronic heart failure with preserved ejection fraction (HFpEF) (HCC)   Influenza  Hospital Course: 69 year old male with a past medical history significant for hypertension, HFpEF (EF 60-65%), COPD on 4L O2 at baseline and Type II Diabetes Mellitus who presented to [facility] ED via EMS with concern of shortness of breath. Found to be in hypoxic/hypercapnic respiratory failure 2/2 Flu infection and CHF exacerbation, requiring Bipap for significant CO2 retention. He was placed on IV lasix , tamiflu  and Bipap with improvement in his symptoms   Acute on chronic hypoxic/hypercapnic respiratory failure likely 2/2 Flu infection/CHF/COPD exacerbation Influenza B infection  Multifocal bronchopneumonia, likely 2.2 flu infection, vs superadded bacterial infection.  Chronic COPD with acute exacerbation  OSA on CPAP CT brain- no acute process. MRSA negative  CTA chest- No PE, Multifocal bronchopneumonia. Repeat Chest in 3 months.  Complete Tamiflu , prednisone  course at home.  Completed antibiotics in the hospital. Down to 3 L [place], which is at baseline.  BiPAP at night, follow-up with pulmonary outpatient Robitussin as needed   Acute on chronic diastolic heart failure CAD s/p PCI Hypertension CXR- vascular congestion/atelectasis  S/p IV diuresis, discharged on home Bumex  aspirin , statin, aldactone , losartan  Allow for cardiology outpatient    Type 2 DM   Steroid-induced hyperglycemia Obesity, BMI- 36.84 Not on any meds at home  Hypokalemia Resolved   Chronic left ankle pain 2/2 prior Left achilles tendon injury/partial tear.  General deconditioning  Prn analgesia - Tylenol , Oxycodone  5mg  (10 tabs)  Pain control - [place]  Controlled Substance Reporting System database was reviewed. and patient was instructed, not to drive, operate heavy machinery, perform activities at heights, swimming or participation in water  activities or provide baby-sitting services while on Pain, Sleep and Anxiety Medications; until their outpatient Physician has advised to do so again. Also recommended to not to take more than prescribed Pain, Sleep and Anxiety Medications.  Consultants: None Procedures performed: None  Disposition: Home Diet recommendation:  Discharge Diet Orders (From admission, onward)     Start     Ordered   09/26/24 0000  Diet - low sodium heart healthy        09/26/24 1302            DISCHARGE MEDICATION: Allergies as of 09/26/2024       Reactions   Erythromycin Anaphylaxis, Swelling   Had eye swelling with erythromycin during a time when he had perf ear drum. Tolerated azithromycin .        Medication List     TAKE these medications    acetaminophen  325 MG tablet Commonly known as: TYLENOL  Take 2 tablets (650 mg total) by mouth every 6 (six) hours as needed for mild pain (pain score 1-3). What changed:  medication strength how much to take when to take this reasons to take this   albuterol  108 (90 Base) MCG/ACT inhaler Commonly known as: Proventil  HFA Inhale 2 puffs into the lungs every  4 (four) hours as needed.   Aspirin  Low Dose 81 MG chewable tablet Generic drug: aspirin  Chew 1 tablet (81 mg total) by mouth daily.   atorvastatin  80 MG tablet Commonly known as: LIPITOR  Take 1 tablet (80 mg total) by mouth daily.   bumetanide  1 MG tablet Commonly known as: BUMEX  Take 3 tablets (3 mg total) by  mouth daily.   guaiFENesin -dextromethorphan  100-10 MG/5ML syrup Commonly known as: ROBITUSSIN DM Take 5 mLs by mouth every 4 (four) hours as needed for cough.   losartan  25 MG tablet Commonly known as: COZAAR  Take 1 tablet (25 mg total) by mouth daily.   oseltamivir  75 MG capsule Commonly known as: TAMIFLU  Take 1 capsule (75 mg total) by mouth 2 (two) times daily for 1 dose.   oxyCODONE  5 MG immediate release tablet Commonly known as: Oxy IR/ROXICODONE  Take 1 tablet (5 mg total) by mouth every 4 (four) hours as needed for moderate pain (pain score 4-6) or severe pain (pain score 7-10).   predniSONE  20 MG tablet Commonly known as: DELTASONE  Take 2 tablets (40 mg total) by mouth daily with breakfast for 1 day. Start taking on: September 27, 2024   spironolactone  25 MG tablet Commonly known as: ALDACTONE  Take 1 tablet (25 mg total) by mouth every morning.        Discharge Exam: Filed Weights   09/24/24 0500 09/25/24 0500 09/26/24 0501  Weight: 130.9 kg 127.2 kg 126.6 kg    General: Adult male, on 3L [place] CV: RRR, S1 S2, NSR on monitor, no r/m/g Pulm: Regular, clear to auscultation bilaterally GI: soft, non-tender, non-distended, no rebound/guarding, bowel sounds x 4 Extremities: Trace edema noted Neuro: A&O x 4, no focal neuro deficits  Condition at discharge: good  The results of significant diagnostics from this hospitalization (including imaging, microbiology, ancillary and laboratory) are listed below for reference.   Imaging Studies: CT HEAD WO CONTRAST ( ) Result Date: 09/21/2024 EXAM: CT HEAD WITHOUT CONTRAST 09/21/2024 11:12:30 PM TECHNIQUE: CT of the head was performed without the administration of intravenous contrast. Automated exposure control, iterative reconstruction, and/or weight based adjustment of the mA/kV was utilized to reduce the radiation dose to as low as reasonably achievable. COMPARISON: 12/18/2023 CLINICAL HISTORY: Syncope, head strike.  FINDINGS: BRAIN AND VENTRICLES: No acute hemorrhage. No evidence of acute infarct. No hydrocephalus. No extra-axial collection. No mass effect or midline shift. Atherosclerotic calcifications within the cavernous internal carotid arteries. ORBITS: No acute abnormality. SINUSES: Mucosal thickening of bilateral maxillary sinuses. Frothy secretions within bilateral sphenoid sinuses. Mucosal thickening of bilateral ethmoid sinuses. SOFT TISSUES AND SKULL: No acute soft tissue abnormality. No skull fracture. Trace right mastoid effusion. IMPRESSION: 1. No acute intracranial abnormality. 2. Sinus disease. Electronically signed by: Morgane Naveau MD 09/21/2024 11:32 PM EST RP Workstation: [id number]   CT Angio Chest PE W and/or Wo Contrast Result Date: 09/21/2024 EXAM: CTA of the Chest with contrast for PE 09/21/2024 11:12:30 PM TECHNIQUE: CTA of the chest was performed without and with the administration of 100 mL of iohexol  (OMNIPAQUE ) 350 MG/ML injection. Multiplanar reformatted images are provided for review. MIP images are provided for review. Automated exposure control, iterative reconstruction, and/or weight based adjustment of the mA/kV was utilized to reduce the radiation dose to as low as reasonably achievable. COMPARISON: CT angio chests 09/02/2023, CT renal 01/04/2023, CT renal 01/07/2021. CLINICAL HISTORY: Recurrent syncope, hypoxia, shortness of breath --evaluate for PE, infection, other pathology. Recurrent syncope, hypoxia, shortness of breath. Evaluate for pulmonary embolism, infection, and other  pathology. FINDINGS: PULMONARY ARTERIES: Pulmonary arteries are adequately opacified for evaluation. No pulmonary embolism. The main pulmonary artery is enlarged, measuring up to 3.6 cm. MEDIASTINUM: Prominent heart size. At least 2-vessel coronary artery calcifications. The thoracic aorta is normal in caliber with bilateral atherosclerotic plaque. The pericardium demonstrates no acute abnormality. LYMPH NODES:  Prominent but not enlarged mediastinal lymph nodes. No hilar or axillary lymphadenopathy. LUNGS AND PLEURA: Expiratory phase of respiration. Diffuse bronchial wall thickening with mucus plugging within the bilateral lower lobes with associated patchy bilateral lower lobe and lingular airspace opacities. The atelectasis of the right middle lobe. No pleural effusion or pneumothorax. UPPER ABDOMEN: Similar chronic hyperdense subcapsular right posterior hepatic lobe lesion measuring 3 cm, likely benign in etiology given chronicity. Diffusely atrophic pancreas. No focal lesion. Otherwise normal pancreatic contour. No surrounding inflammatory changes. No main pancreatic ductal dilatation. Fatty infiltration of the colonic wall suggestive of chronic inflammatory changes or constipation. SOFT TISSUES AND BONES: No acute bone or soft tissue abnormality. IMPRESSION: 1. No pulmonary embolism. 2. Diffuse bronchial wall thickening with mucus plugging and associated patchy bilateral lower lobe and lingular airspace opacities suggestive of multifocal bronchopneumonia. Recommend repeat CT in 3 months to evaluate for complete resolution. 3. Enlarged main pulmonary artery measuring up to 3.6 cm. 4. Prominent heart size. Electronically signed by: Morgane Naveau MD 09/21/2024 11:29 PM EST RP Workstation: [id number]   DG Chest Portable 1 View Result Date: 09/21/2024 EXAM: 1 VIEW(S) XRAY OF THE CHEST 09/21/2024 09:55:00 PM COMPARISON: 09/05/2024 CLINICAL HISTORY: Hypoxia, cough. FINDINGS: LUNGS AND PLEURA: Bibasilar atelectatic changes are again seen. Mild vascular congestion is noted without edema. No pleural effusion. No pneumothorax. HEART AND MEDIASTINUM: Cardiomegaly. BONES AND SOFT TISSUES: No acute osseous abnormality. IMPRESSION: 1. Bibasilar atelectatic changes. 2. Mild vascular congestion without edema. 3. Cardiomegaly. Electronically signed by: Oneil Devonshire MD 09/21/2024 10:01 PM EST RP Workstation: [id number]   DG Chest  Port 1 View Result Date: 09/05/2024 EXAM: 1 VIEW XRAY OF THE CHEST 09/05/2024 09:17:00 AM COMPARISON: 09/01/2024 CLINICAL HISTORY: 69 year old male with hypercapnic respiratory failure (hepatocellular carcinoma). FINDINGS: LUNGS AND PLEURA: Low lung volumes. Linear opacity in left mid lung, and patchy left greater than right lung bases, stable compared to prior exam. No pleural effusion. No pneumothorax. HEART AND MEDIASTINUM: Cardiomegaly, stable. Aortic atherosclerosis. BONES AND SOFT TISSUES: No acute osseous abnormality. IMPRESSION: 1. Ongoing low lung volumes, atelectasis. Electronically signed by: Helayne Hurst MD MD 09/05/2024 09:28 AM EST RP Workstation: [id number]   ECHOCARDIOGRAM COMPLETE Result Date: 09/03/2024    ECHOCARDIOGRAM REPORT   Patient Name:   JUDITH CAMPILLO Date of Exam: 09/03/2024 Medical Rec #:  [id number]         Height:       73.0 in Accession #:    [id number]        Weight:       300.0 lb Date of Birth:  1956/08/25         BSA:          2.556 m Patient Age:    68 years          BP:           133/36 mmHg Patient Gender: M                 HR:           81 bpm. Exam Location:  [facility] Procedure: 2D Echo, Cardiac Doppler, Color Doppler and Intracardiac  Opacification Agent (Both Spectral and Color Flow Doppler were            utilized during procedure). Indications:     Syncope  History:         Patient has prior history of Echocardiogram examinations, most                  recent 01/07/2023. CHF, CAD, COPD; Risk Factors:Hypertension,                  Diabetes, Sleep Apnea and Dyslipidemia.  Sonographer:     Philomena Daring Referring Phys:  [id number] GJW A MANSY Diagnosing Phys: Caron Poser IMPRESSIONS  1. Left ventricular ejection fraction, by estimation, is 60 to 65%. The left ventricle has normal function. The left ventricle has no regional wall motion abnormalities. There is severe concentric left ventricular hypertrophy. Indeterminate diastolic filling due to E-A fusion. No SAM or  LVOT gradient visualized.  2. Right ventricular systolic function is normal. The right ventricular size is normal.  3. The mitral valve is grossly normal. No evidence of mitral valve regurgitation. No evidence of mitral stenosis.  4. The aortic valve has an indeterminant number of cusps. Aortic valve regurgitation is not visualized. No aortic stenosis is present.  5. Aortic dilatation noted. There is mild dilatation of the ascending aorta, measuring 41 mm.  6. The inferior vena cava is dilated in size with >50% respiratory variability, suggesting right atrial pressure of 8 mmHg. Comparison(s): A prior study was performed on 01/07/2023. No significant change from prior study. FINDINGS  Left Ventricle: Left ventricular ejection fraction, by estimation, is 60 to 65%. The left ventricle has normal function. The left ventricle has no regional wall motion abnormalities. Definity  contrast agent was given IV to delineate the left ventricular  endocardial borders. The left ventricular internal cavity size was normal in size. There is severe concentric left ventricular hypertrophy. Indeterminate diastolic filling due to E-A fusion. Right Ventricle: The right ventricular size is normal. Right vetricular wall thickness was not well visualized. Right ventricular systolic function is normal. Left Atrium: Left atrial size was normal in size. Right Atrium: Right atrial size was normal in size. Pericardium: Trivial pericardial effusion is present. Mitral Valve: The mitral valve is grossly normal. No evidence of mitral valve regurgitation. No evidence of mitral valve stenosis. Tricuspid Valve: The tricuspid valve is not well visualized. Tricuspid valve regurgitation is not demonstrated. No evidence of tricuspid stenosis. Aortic Valve: The aortic valve has an indeterminant number of cusps. Aortic valve regurgitation is not visualized. No aortic stenosis is present. Pulmonic Valve: The pulmonic valve was not well visualized. Pulmonic  valve regurgitation is not visualized. No evidence of pulmonic stenosis. Aorta: The aortic root is normal in size and structure and aortic dilatation noted. There is mild dilatation of the ascending aorta, measuring 41 mm. Venous: The inferior vena cava is dilated in size with greater than 50% respiratory variability, suggesting right atrial pressure of 8 mmHg. IAS/Shunts: The interatrial septum was not well visualized.  LEFT VENTRICLE PLAX 2D LVIDd:         4.50 cm   Diastology LVIDs:         2.80 cm   LV e' medial:    6.20 cm/s LV PW:         1.50 cm   LV E/e' medial:  12.9 LV IVS:        1.80 cm   LV e' lateral:   5.00 cm/s LVOT diam:  2.10 cm   LV E/e' lateral: 16.0 LV SV:         76 LV SV Index:   30 LVOT Area:     3.46 cm  RIGHT VENTRICLE             IVC RV Basal diam:  3.80 cm     IVC diam: 2.40 cm RV Mid diam:    2.50 cm RV S prime:     18.20 cm/s TAPSE (M-mode): 2.6 cm LEFT ATRIUM             Index        RIGHT ATRIUM           Index LA diam:        3.40 cm 1.33 cm/m   RA Area:     19.70 cm LA Vol (A2C):   86.8 ml 33.96 ml/m  RA Volume:   51.80 ml  20.26 ml/m LA Vol (A4C):   49.7 ml 19.44 ml/m LA Biplane Vol: 66.1 ml 25.86 ml/m  AORTIC VALVE LVOT Vmax:   134.00 cm/s LVOT Vmean:  83.600 cm/s LVOT VTI:    0.218 m  AORTA Ao Root diam: 3.40 cm MITRAL VALVE               TRICUSPID VALVE MV Area (PHT): 3.89 cm    TR Peak grad:   8.5 mmHg MV Decel Time: 195 msec    TR Vmax:        146.00 cm/s MV E velocity: 79.90 cm/s MV A velocity: 85.90 cm/s  SHUNTS MV E/A ratio:  0.93        Systemic VTI:  0.22 m                            Systemic Diam: 2.10 cm Caron Poser Electronically signed by Caron Poser Signature Date/Time: 09/03/2024/9:04:22 AM    Final    MR ANKLE LEFT WO CONTRAST Result Date: 09/03/2024 CLINICAL DATA:  Achilles tendon pain. History of prior Achilles repair over 30 years ago. EXAM: MRI OF THE LEFT ANKLE WITHOUT CONTRAST TECHNIQUE: Multiplanar, multisequence MR imaging of the ankle was  performed. No intravenous contrast was administered. COMPARISON:  Left foot radiographs dated 09/02/2024. FINDINGS: Peroneal: Peroneal longus tendon intact. Peroneal brevis intact. Posteromedial: Posterior tibial tendon intact. Flexor hallucis longus tendon intact. Flexor digitorum longus tendon intact. Anterior: Tibialis anterior tendon intact. Extensor hallucis longus tendon intact Extensor digitorum longus tendon intact. Achilles: Fusiform thickening of the distal Achilles tendon with irregular curvilinear intrasubstance T2 hyperintense signal extending for a length of approximately 4.7 cm through the center of the thickened distal Achilles tendon with possible extension through the posterior superficial fibers, concerning for partial-thickness tear with tendinosis. The distal margin of the partial-thickness tear is 4.5 cm proximal to the calcaneal insertion of the Achilles tendon. The calcaneal insertion of the Achilles tendon is intact without discrete tear. No evidence of tendon retraction. Plantar Fascia: Intact. LIGAMENTS Lateral: Anterior talofibular ligament intact. Calcaneofibular ligament intact. Posterior talofibular ligament intact. Anterior and posterior tibiofibular ligaments intact. Medial: Deltoid ligament intact. Spring ligament intact. CARTILAGE Ankle Joint: No joint effusion. Normal ankle mortise. No chondral defect. Subtalar Joints/Sinus Tarsi: Normal subtalar joints. No subtalar joint effusion. Normal sinus tarsi. Bones: No aggressive osseous lesion. No fracture or dislocation. Mild joint space narrowing of the talonavicular, naviculocuneiform, and midfoot articulations. Soft Tissue: Diffuse subcutaneous edema of the ankle. No loculated fluid collection. Generalized atrophy of the  musculature. IMPRESSION: 1. Findings most compatible with partial-thickness tear of the distal Achilles tendon extending for a length of approximately 4.7 cm with the distal margin of the partial-thickness tear 4.5  cm proximal to the calcaneal insertion. The calcaneal insertion of the Achilles tendon is intact without discrete tear. Associated distal Achilles tendinosis. 2. Diffuse subcutaneous edema of the ankle. Electronically Signed   By: Harrietta Sherry M.D.   On: 09/03/2024 08:24   DG Foot 2 Views Left Result Date: 09/02/2024 EXAM: 2 VIEW(S) XRAY OF THE LEFT FOOT 09/02/2024 06:59:00 AM COMPARISON: None available. CLINICAL HISTORY: 69 year old male. Left ankle pain. FINDINGS: BONES AND JOINTS: Appearance of healed chronic fractures 2nd through 4th proximal phalanges. No acute fracture. No malalignment. Mild osteoarthritis of the first metatarsophalangeal joint. SOFT TISSUES: Diffuse soft tissue edema. No soft tissue gas. IMPRESSION: 1. No acute osseous abnormality identified about the left foot. Diffuse left foot soft tissue swelling. Electronically signed by: Helayne Hurst MD MD 09/02/2024 07:06 AM EST RP Workstation: [id number]   CT Angio Chest PE W and/or Wo Contrast Result Date: 09/01/2024 EXAM: CTA of the Chest with contrast for PE 09/01/2024 05:13:29 PM TECHNIQUE: CTA of the chest was performed without and with the administration of 75 mL of iohexol  (OMNIPAQUE ) 350 MG/ML injection. Multiplanar reformatted images are provided for review. MIP images are provided for review. Automated exposure control, iterative reconstruction, and/or weight based adjustment of the mA/kV was utilized to reduce the radiation dose to as low as reasonably achievable. COMPARISON: CT angiogram chest for 01/18/2024 and CT abdomen and pelvis 01/05/2023. CLINICAL HISTORY: left sided chest pain, hypoxia FINDINGS: PULMONARY ARTERIES: Pulmonary arteries are adequately opacified for evaluation. No pulmonary embolism. Main pulmonary artery is normal in caliber. MEDIASTINUM: The heart is mildly enlarged. Coronary atherosclerotic calcifications are noted. Aortic atherosclerotic calcifications are noted. There is no acute abnormality of the thoracic  aorta. The pericardium demonstrates no acute abnormality. LYMPH NODES: No mediastinal, hilar or axillary lymphadenopathy. LUNGS AND PLEURA: Mild atelectatic changes in both lower lobes. There are some mild patchy ground glass opacities in the lung bases bilaterally. No pleural effusion or pneumothorax. UPPER ABDOMEN: No significant interval change in peripheral mildly hyperdense area in the right lobe of the liver measuring 2.8 x 1.1 cm when compared to 01/05/2023. SOFT TISSUES AND BONES: No acute bone or soft tissue abnormality. IMPRESSION: 1. No pulmonary embolism. 2. Mild bibasilar atelectasis with mild patchy ground-glass opacities in the lung bases bilaterally. Findings may be related to mild edema or infectious/inflammatory process. 3. Mild cardiomegaly with coronary and aortic atherosclerotic calcifications. 4. No significant interval change in a peripheral mildly hyperdense right hepatic lesion measuring 2.8 x 1.1 cm compared to 01/05/2023, favored as benign. Electronically signed by: Greig Pique MD MD 09/01/2024 05:29 PM EST RP Workstation: [id number]   DG Chest 2 View Result Date: 09/01/2024 CLINICAL DATA:  Chest pain. EXAM: CHEST - 2 VIEW COMPARISON:  12/18/2023 FINDINGS: Cardiomegaly. Slightly increased densities in left mid lung could represent atelectasis with underlying scarring in this area. No overt pulmonary edema. No focal airspace disease. Trachea is midline. No large pleural effusions. No acute bone abnormality. IMPRESSION: 1. Slightly increased densities in the left mid lung could represent atelectasis with underlying scarring. 2. Cardiomegaly.  No pulmonary edema. Electronically Signed   By: Juliene Balder M.D.   On: 09/01/2024 14:41    Microbiology: Results for orders placed or performed during the hospital encounter of 09/21/24  Resp panel by RT-PCR (RSV, Flu A&B, Covid) Anterior Nasal  Swab     Status: Abnormal   Collection Time: 09/21/24 10:03 PM   Specimen: Anterior Nasal Swab   Result Value Ref Range Status   SARS Coronavirus 2 by RT PCR NEGATIVE NEGATIVE Final    Comment: (NOTE) SARS-CoV-2 target nucleic acids are NOT DETECTED.  The SARS-CoV-2 RNA is generally detectable in upper respiratory specimens during the acute phase of infection. The lowest concentration of SARS-CoV-2 viral copies this assay can detect is 138 copies/mL. A negative result does not preclude SARS-Cov-2 infection and should not be used as the sole basis for treatment or other patient management decisions. A negative result may occur with  improper specimen collection/handling, submission of specimen other than nasopharyngeal swab, presence of viral mutation(s) within the areas targeted by this assay, and inadequate number of viral copies(<138 copies/mL). A negative result must be combined with clinical observations, patient history, and epidemiological information. The expected result is Negative.  Fact Sheet for Patients:  bloggercourse.com  Fact Sheet for Healthcare Providers:  seriousbroker.it  This test is no t yet approved or cleared by the [place]  FDA and  has been authorized for detection and/or diagnosis of SARS-CoV-2 by FDA under an Emergency Use Authorization (EUA). This EUA will remain  in effect (meaning this test can be used) for the duration of the COVID-19 declaration under Section 564(b)(1) of the Act, 21 U.S.C.section 360bbb-3(b)(1), unless the authorization is terminated  or revoked sooner.       Influenza A by PCR NEGATIVE NEGATIVE Final   Influenza B by PCR POSITIVE (A) NEGATIVE Final    Comment: (NOTE) The Xpert Xpress SARS-CoV-2/FLU/RSV plus assay is intended as an aid in the diagnosis of influenza from Nasopharyngeal swab specimens and should not be used as a sole basis for treatment. Nasal washings and aspirates are unacceptable for Xpert Xpress SARS-CoV-2/FLU/RSV testing.  Fact Sheet for  Patients: bloggercourse.com  Fact Sheet for Healthcare Providers: seriousbroker.it  This test is not yet approved or cleared by the [place]  FDA and has been authorized for detection and/or diagnosis of SARS-CoV-2 by FDA under an Emergency Use Authorization (EUA). This EUA will remain in effect (meaning this test can be used) for the duration of the COVID-19 declaration under Section 564(b)(1) of the Act, 21 U.S.C. section 360bbb-3(b)(1), unless the authorization is terminated or revoked.     Resp Syncytial Virus by PCR NEGATIVE NEGATIVE Final    Comment: (NOTE) Fact Sheet for Patients: bloggercourse.com  Fact Sheet for Healthcare Providers: seriousbroker.it  This test is not yet approved or cleared by the [place]  FDA and has been authorized for detection and/or diagnosis of SARS-CoV-2 by FDA under an Emergency Use Authorization (EUA). This EUA will remain in effect (meaning this test can be used) for the duration of the COVID-19 declaration under Section 564(b)(1) of the Act, 21 U.S.C. section 360bbb-3(b)(1), unless the authorization is terminated or revoked.  Performed at [facility], [address] [place], [place] [address]   MRSA Next Gen by PCR, Nasal     Status: None   Collection Time: 09/22/24  5:32 AM   Specimen: Nasal Mucosa; Nasal Swab  Result Value Ref Range Status   MRSA by PCR Next Gen NOT DETECTED NOT DETECTED Final    Comment: (NOTE) The GeneXpert MRSA Assay (FDA approved for NASAL specimens only), is one component of a comprehensive MRSA colonization surveillance program. It is not intended to diagnose MRSA infection nor to guide or monitor treatment for MRSA infections. Test performance is  not FDA approved in patients less than 15 years old. Performed at [facility], [address] [place], [place] [address]      Labs: CBC: Recent Labs  Lab 09/21/24 2203 09/22/24 0532 09/23/24 0316 09/24/24 0332 09/25/24 0419  WBC 6.5 8.1 8.9 7.9 7.4  NEUTROABS 4.8  --   --   --   --   HGB 11.3* 12.1* 11.9* 11.4* 12.2*  HCT 37.1* 40.8 38.2* 36.7* 38.2*  MCV 95.4 97.6 94.6 93.1 91.6  PLT 172 176 201 183 190   Basic Metabolic Panel: Recent Labs  Lab 09/22/24 0532 09/23/24 0316 09/24/24 0332 09/25/24 0419 09/26/24 0508  NA 143 140 135 140 139  K 4.0 4.1 3.8 3.4* 3.8  CL 94* 92* 89* 89* 91*  CO2 33* 40* 41* 43* 43*  GLUCOSE 273* 147* 138* 153* 127*  BUN 16 22 25* 27* 30*  CREATININE 1.09  1.06 1.05 0.96 1.07 1.01  CALCIUM  8.9 8.9 8.8* 8.8* 8.9  MG 1.9 2.0  --  2.1 2.2  PHOS 4.1 2.4* 2.1* 3.3  --    Liver Function Tests: Recent Labs  Lab 09/21/24 2203 09/23/24 0316 09/24/24 0332 09/25/24 0419  AST 23  --   --   --   ALT 19  --   --   --   ALKPHOS 65  --   --   --   BILITOT 0.4  --   --   --   PROT 6.3*  --   --   --   ALBUMIN 3.5 3.6 3.3* 3.5   CBG: Recent Labs  Lab 09/25/24 2248 09/26/24 0023 09/26/24 0359 09/26/24 0818 09/26/24 1159  GLUCAP 330* 240* 124* 146* 216*    Discharge time spent: greater than 30 minutes.  Signed: Laree Lock, MD [facility] 09/26/2024 "

## 2024-09-26 NOTE — TOC Progression Note (Addendum)
 Transition of Care [facility]) - Progression Note    Patient Details  Name: Cody Alexander MRN: [id number] Date of Birth: 11-Jul-1956  [facility] CM/SW Contact  Victory Jackquline RAMAN, RN Phone Number: 09/26/2024, 12:51 PM  Clinical Narrative:    RNCM received a message from the MD via secure chat informing me: Patient is medically ready, can go home. But states does not have anyone to pick him up. Is there we can do to get him home today? RNCM called patient @ [phone], introduced myself and my role and explained that discharge planning would be discussed. Asked him if he would be able travel via [place] and he said yes but didn't have funds for the transport and he says that he's on oxygen and he's saying that he doesn't have his portable tank with him. He has it at home. He is in agreement with an [place] and the address in the system is correct: [address] [place] [address].  Patient's DME company is: [facility] [employer] in [place]. MD, bedside nurse and supervisor made aware. MD confirmed He is on O2 at home, chronic - but needs at transport.   RNCM will continue to follow for discharge planning needs.     3:00 pm: RNCM received a message from leadership that the patient would be transported home via the [facility] with portable oxygen tank from the unit and the [employer] will bring the tank back to the unit.   Barriers to Discharge: Continued Medical Work up               Expected Discharge Plan and Services                                   HH Arranged: RN, PT, OT, Social Work [facility] Agency: [employer]         Social Drivers of Health (SDOH) Interventions SDOH Screenings   Food Insecurity: No Food Insecurity (09/22/2024)  Housing: High Risk (09/22/2024)  Transportation Needs: No Transportation Needs (09/22/2024)  Utilities: Not At Risk (09/22/2024)  Alcohol Screen: Low Risk (07/23/2023)  Financial Resource Strain: Low Risk  (07/23/2023)  Physical Activity: Inactive (09/11/2023)   [facility]  Social Connections: Moderately Isolated (09/22/2024)  Stress: Stress Concern Present (09/11/2023)   Received from [facility]  Tobacco Use: Medium Risk (09/21/2024)  Health Literacy: Medium Risk (09/11/2023)   Received from [facility]    Readmission Risk Interventions    09/07/2024    1:28 PM 09/29/2023    1:04 PM 07/23/2023    1:58 PM  Readmission Risk Prevention Plan  Transportation Screening Complete Complete Complete  PCP or Specialist Appt within 3-5 Days Complete Complete Complete  HRI or Home Care Consult Complete Complete Complete  Social Work Consult for Recovery Care Planning/Counseling  Complete Complete  Palliative Care Screening Not Applicable Not Applicable Not Applicable  Medication Review Oceanographer) Complete Complete Complete

## 2024-09-27 ENCOUNTER — Other Ambulatory Visit: Payer: Self-pay
